# Patient Record
Sex: Male | Born: 1965 | Race: Black or African American | Hispanic: No | Marital: Married | Smoking: Never smoker
Health system: Southern US, Community
[De-identification: ages and names within clinical notes are randomized; demographics above are authoritative.]

## PROBLEM LIST (undated history)

## (undated) DIAGNOSIS — B9689 Other specified bacterial agents as the cause of diseases classified elsewhere: Secondary | ICD-10-CM

## (undated) DIAGNOSIS — T7840XA Allergy, unspecified, initial encounter: Secondary | ICD-10-CM

## (undated) DIAGNOSIS — R509 Fever, unspecified: Secondary | ICD-10-CM

## (undated) DIAGNOSIS — E119 Type 2 diabetes mellitus without complications: Secondary | ICD-10-CM

## (undated) DIAGNOSIS — I639 Cerebral infarction, unspecified: Secondary | ICD-10-CM

## (undated) DIAGNOSIS — A498 Other bacterial infections of unspecified site: Secondary | ICD-10-CM

## (undated) DIAGNOSIS — I619 Nontraumatic intracerebral hemorrhage, unspecified: Secondary | ICD-10-CM

## (undated) DIAGNOSIS — I5189 Other ill-defined heart diseases: Secondary | ICD-10-CM

## (undated) DIAGNOSIS — I10 Essential (primary) hypertension: Secondary | ICD-10-CM

## (undated) DIAGNOSIS — N183 Chronic kidney disease, stage 3 unspecified: Secondary | ICD-10-CM

## (undated) DIAGNOSIS — J9600 Acute respiratory failure, unspecified whether with hypoxia or hypercapnia: Secondary | ICD-10-CM

## (undated) DIAGNOSIS — I615 Nontraumatic intracerebral hemorrhage, intraventricular: Secondary | ICD-10-CM

## (undated) DIAGNOSIS — R066 Hiccough: Secondary | ICD-10-CM

## (undated) DIAGNOSIS — R7303 Prediabetes: Secondary | ICD-10-CM

## (undated) HISTORY — DX: Allergy, unspecified, initial encounter: T78.40XA

## (undated) HISTORY — DX: Prediabetes: R73.03

## (undated) HISTORY — PX: NO PAST SURGERIES: SHX2092

---

## 1998-11-02 ENCOUNTER — Emergency Department (HOSPITAL_COMMUNITY): Admission: EM | Admit: 1998-11-02 | Discharge: 1998-11-02 | Payer: Self-pay | Admitting: Emergency Medicine

## 2000-05-26 ENCOUNTER — Encounter: Payer: Self-pay | Admitting: Nephrology

## 2000-05-26 ENCOUNTER — Encounter: Admission: RE | Admit: 2000-05-26 | Discharge: 2000-05-26 | Payer: Self-pay | Admitting: Nephrology

## 2015-04-19 ENCOUNTER — Emergency Department (HOSPITAL_COMMUNITY): Payer: BLUE CROSS/BLUE SHIELD

## 2015-04-19 ENCOUNTER — Encounter (HOSPITAL_COMMUNITY): Payer: Self-pay | Admitting: Physical Medicine and Rehabilitation

## 2015-04-19 ENCOUNTER — Inpatient Hospital Stay (HOSPITAL_COMMUNITY)
Admission: EM | Admit: 2015-04-19 | Discharge: 2015-04-23 | DRG: 305 | Disposition: A | Discharge status: Home / Self Care | Payer: BLUE CROSS/BLUE SHIELD | Attending: Internal Medicine | Admitting: Internal Medicine

## 2015-04-19 DIAGNOSIS — R9431 Abnormal electrocardiogram [ECG] [EKG]: Secondary | ICD-10-CM | POA: Diagnosis not present

## 2015-04-19 DIAGNOSIS — I10 Essential (primary) hypertension: Secondary | ICD-10-CM | POA: Diagnosis not present

## 2015-04-19 DIAGNOSIS — N189 Chronic kidney disease, unspecified: Secondary | ICD-10-CM | POA: Diagnosis present

## 2015-04-19 DIAGNOSIS — R718 Other abnormality of red blood cells: Secondary | ICD-10-CM | POA: Diagnosis present

## 2015-04-19 DIAGNOSIS — E876 Hypokalemia: Secondary | ICD-10-CM | POA: Diagnosis present

## 2015-04-19 DIAGNOSIS — R0609 Other forms of dyspnea: Secondary | ICD-10-CM | POA: Diagnosis present

## 2015-04-19 DIAGNOSIS — N179 Acute kidney failure, unspecified: Secondary | ICD-10-CM | POA: Diagnosis not present

## 2015-04-19 DIAGNOSIS — Z9114 Patient's other noncompliance with medication regimen: Secondary | ICD-10-CM

## 2015-04-19 DIAGNOSIS — I253 Aneurysm of heart: Secondary | ICD-10-CM | POA: Diagnosis present

## 2015-04-19 DIAGNOSIS — I160 Hypertensive urgency: Principal | ICD-10-CM | POA: Diagnosis present

## 2015-04-19 DIAGNOSIS — I1310 Hypertensive heart and chronic kidney disease without heart failure, with stage 1 through stage 4 chronic kidney disease, or unspecified chronic kidney disease: Secondary | ICD-10-CM | POA: Diagnosis present

## 2015-04-19 DIAGNOSIS — Y92230 Patient room in hospital as the place of occurrence of the external cause: Secondary | ICD-10-CM | POA: Diagnosis not present

## 2015-04-19 DIAGNOSIS — T502X5A Adverse effect of carbonic-anhydrase inhibitors, benzothiadiazides and other diuretics, initial encounter: Secondary | ICD-10-CM | POA: Diagnosis not present

## 2015-04-19 DIAGNOSIS — R0602 Shortness of breath: Secondary | ICD-10-CM

## 2015-04-19 DIAGNOSIS — Z823 Family history of stroke: Secondary | ICD-10-CM

## 2015-04-19 HISTORY — DX: Essential (primary) hypertension: I10

## 2015-04-19 LAB — IRON AND TIBC
IRON: 55 ug/dL (ref 45–182)
SATURATION RATIOS: 17 % — AB (ref 17.9–39.5)
TIBC: 329 ug/dL (ref 250–450)
UIBC: 274 ug/dL

## 2015-04-19 LAB — CBC WITH DIFFERENTIAL/PLATELET
Basophils Absolute: 0 10*3/uL (ref 0.0–0.1)
Basophils Relative: 0 %
Eosinophils Absolute: 0 10*3/uL (ref 0.0–0.7)
Eosinophils Relative: 1 %
HCT: 43.5 % (ref 39.0–52.0)
Hemoglobin: 14 g/dL (ref 13.0–17.0)
Lymphocytes Relative: 26 %
Lymphs Abs: 1.8 10*3/uL (ref 0.7–4.0)
MCH: 24.4 pg — ABNORMAL LOW (ref 26.0–34.0)
MCHC: 32.2 g/dL (ref 30.0–36.0)
MCV: 75.9 fL — ABNORMAL LOW (ref 78.0–100.0)
Monocytes Absolute: 0.2 10*3/uL (ref 0.1–1.0)
Monocytes Relative: 4 %
Neutro Abs: 4.7 10*3/uL (ref 1.7–7.7)
Neutrophils Relative %: 69 %
Platelets: 199 10*3/uL (ref 150–400)
RBC: 5.73 MIL/uL (ref 4.22–5.81)
RDW: 13.6 % (ref 11.5–15.5)
WBC: 6.8 10*3/uL (ref 4.0–10.5)

## 2015-04-19 LAB — COMPREHENSIVE METABOLIC PANEL
ALT: 44 U/L (ref 17–63)
AST: 30 U/L (ref 15–41)
Albumin: 3.8 g/dL (ref 3.5–5.0)
Alkaline Phosphatase: 56 U/L (ref 38–126)
Anion gap: 10 (ref 5–15)
BUN: 13 mg/dL (ref 6–20)
CO2: 28 mmol/L (ref 22–32)
Calcium: 9.1 mg/dL (ref 8.9–10.3)
Chloride: 102 mmol/L (ref 101–111)
Creatinine, Ser: 1.36 mg/dL — ABNORMAL HIGH (ref 0.61–1.24)
GFR calc Af Amer: >60 mL/min (ref >60)
GFR calc non Af Amer: 59 mL/min — ABNORMAL LOW (ref >60)
Glucose, Bld: 121 mg/dL — ABNORMAL HIGH (ref 65–99)
Potassium: 3.4 mmol/L — ABNORMAL LOW (ref 3.5–5.1)
Sodium: 140 mmol/L (ref 135–145)
Total Bilirubin: 0.5 mg/dL (ref 0.3–1.2)
Total Protein: 7.4 g/dL (ref 6.5–8.1)

## 2015-04-19 LAB — URINALYSIS, ROUTINE W REFLEX MICROSCOPIC
Bilirubin Urine: NEGATIVE
Glucose, UA: NEGATIVE mg/dL
Hgb urine dipstick: NEGATIVE
Ketones, ur: NEGATIVE mg/dL
LEUKOCYTES UA: NEGATIVE
NITRITE: NEGATIVE
PH: 6.5 (ref 5.0–8.0)
Protein, ur: NEGATIVE mg/dL
Specific Gravity, Urine: 1.016 (ref 1.005–1.030)

## 2015-04-19 LAB — BRAIN NATRIURETIC PEPTIDE: B Natriuretic Peptide: 61.3 pg/mL (ref 0.0–100.0)

## 2015-04-19 LAB — TSH: TSH: 1.673 u[IU]/mL (ref 0.350–4.500)

## 2015-04-19 LAB — VITAMIN B12: Vitamin B-12: 1134 pg/mL — ABNORMAL HIGH (ref 180–914)

## 2015-04-19 LAB — FERRITIN: Ferritin: 96 ng/mL (ref 24–336)

## 2015-04-19 LAB — I-STAT TROPONIN, ED: Troponin i, poc: 0.03 ng/mL (ref 0.00–0.08)

## 2015-04-19 LAB — FOLATE: FOLATE: 10.6 ng/mL (ref >5.9)

## 2015-04-19 LAB — RETICULOCYTES
RBC.: 5.28 MIL/uL (ref 4.22–5.81)
Retic Count, Absolute: 95 K/uL (ref 19.0–186.0)
Retic Ct Pct: 1.8 % (ref 0.4–3.1)

## 2015-04-19 LAB — MAGNESIUM: Magnesium: 2.2 mg/dL (ref 1.7–2.4)

## 2015-04-19 MED ORDER — ALUM & MAG HYDROXIDE-SIMETH 200-200-20 MG/5ML PO SUSP
30.0000 mL | Freq: Four times a day (QID) | ORAL | Status: DC | PRN
  Administered 2015-04-19 – 2015-04-22 (×2): 30 mL via ORAL
  Filled 2015-04-19 (×3): qty 30

## 2015-04-19 MED ORDER — SODIUM CHLORIDE 0.9% FLUSH
3.0000 mL | Freq: Two times a day (BID) | INTRAVENOUS | Status: DC
  Administered 2015-04-19 – 2015-04-22 (×5): 3 mL via INTRAVENOUS

## 2015-04-19 MED ORDER — ACETAMINOPHEN 325 MG PO TABS
650.0000 mg | ORAL_TABLET | Freq: Four times a day (QID) | ORAL | Status: DC | PRN
  Administered 2015-04-20 (×2): 650 mg via ORAL
  Filled 2015-04-19 (×2): qty 2

## 2015-04-19 MED ORDER — ENOXAPARIN SODIUM 40 MG/0.4ML ~~LOC~~ SOLN
40.0000 mg | SUBCUTANEOUS | Status: DC
  Filled 2015-04-19 (×3): qty 0.4

## 2015-04-19 MED ORDER — ACETAMINOPHEN 650 MG RE SUPP: 650.0000 mg | Freq: Four times a day (QID) | RECTAL | Status: DC | PRN

## 2015-04-19 MED ORDER — CLONIDINE HCL 0.2 MG PO TABS
0.2000 mg | ORAL_TABLET | Freq: Once | ORAL | Status: AC
  Administered 2015-04-19: 0.2 mg via ORAL
  Filled 2015-04-19: qty 1

## 2015-04-19 MED ORDER — ASPIRIN EC 81 MG PO TBEC
81.0000 mg | DELAYED_RELEASE_TABLET | Freq: Every day | ORAL | Status: DC
  Administered 2015-04-19 – 2015-04-23 (×5): 81 mg via ORAL
  Filled 2015-04-19 (×5): qty 1

## 2015-04-19 MED ORDER — AMLODIPINE BESYLATE 5 MG PO TABS
10.0000 mg | ORAL_TABLET | Freq: Every day | ORAL | Status: DC
  Administered 2015-04-19: 10 mg via ORAL
  Filled 2015-04-19: qty 2

## 2015-04-19 MED ORDER — LISINOPRIL 20 MG PO TABS
20.0000 mg | ORAL_TABLET | Freq: Every day | ORAL | Status: DC
  Administered 2015-04-19 – 2015-04-20 (×2): 20 mg via ORAL
  Filled 2015-04-19 (×2): qty 1

## 2015-04-19 MED ORDER — POTASSIUM CHLORIDE CRYS ER 20 MEQ PO TBCR
40.0000 meq | EXTENDED_RELEASE_TABLET | Freq: Once | ORAL | Status: AC
  Administered 2015-04-19: 40 meq via ORAL
  Filled 2015-04-19: qty 2

## 2015-04-19 NOTE — ED Notes (Signed)
Admitting at bedside 

## 2015-04-19 NOTE — H&P (Signed)
[facility] History and Physical                                                                                    Stephen Delgado, is a 50 y.o. male  MRN: [id number]   DOB - 1965-02-28  Admit Date - 04/19/2015  Outpatient Primary MD UNASSIGNED (previously saw Dr. Anson Fret)  Referring MD: Lacinda Axon / ER  Consulting M.D: Silverio Decamp / Neurology  PMH: Past Medical History  Diagnosis Date  . Hypertension       PSH: History reviewed. No pertinent past surgical history.   CC:  Chief Complaint  Patient presents with  . Hypertension     HPI: 50 year old [place] male with long-standing history of hypertension. His primary care physician recently retired and he is in the process of establishing with a new physician who he describes as "an [place] doctor". Patient reports that he typically forgets to take his blood pressure pills. He is able to afford his medication (Lisinopril) which is available at [facility] for $4. He reports for the past 4 days he's been experiencing dyspnea on exertion, increased belching symptoms as well as increased flatus. He has not had any abdominal pain or no GI symptoms, no postprandial symptoms, he denies a family history of significant medical disease including hypertension, diabetes or heart attacks/heart problems. He has not had any lower extremity edema. He's not had any orthopnea. He has not had any diaphoresis or chest pain with activity.  ER Evaluation and treatment: Afebrile-initial blood pressure 227/128-pulse 101-respirations 20; repeat blood pressure 167/109; room air saturations 100% EKG: Sinus rhythm with ventricular rate 90 bpm, QTC 447 seconds, voltage criteria met for LVH, also evidence of LV strain with downsloping ST segment with T-wave inversion in leads 1-3 V5 and V6 but no obvious ischemic changes and no conduction delays 2 view CXR: No edema or consolidation and no radiographic evidence of cardiomegaly Lab data: K 3.4, Cr 1.36, BNP 61.3,  troponin 0.03, MCV 75.9 with hemoglobin 14 Catapres 0.2 mg tablet 1 dose Norvasc 10 mg by mouth 1 dose Aspirin 81 mg 1 dose  Review of Systems   In addition to the HPI above,  No Fever-chills, myalgias or other constitutional symptoms No Headache, changes with Vision or hearing, new weakness, tingling, numbness in any extremity, No problems swallowing food or Liquids, indigestion/reflux No Chest pain, Cough, palpitations, orthopnea  No Abdominal pain, N/V; no melena or hematochezia, no dark tarry stools No dysuria, hematuria or flank pain No new skin rashes, lesions, masses or bruises, No new joints pains-aches No recent weight gain or loss No polyuria, polydypsia or polyphagia,  *A full 10 point Review of Systems was done, except as stated above, all other Review of Systems were negative.  Social History Social History  Substance Use Topics  . Smoking status: Never Smoker   . Smokeless tobacco: Not on file  . 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 Patient denies significant family history of  cardiovascular disease, need for dialysis, diabetes, hypertension   Prior to Admission medications   Medication Sig Start Date End Date Taking? Authorizing Provider  aspirin EC 81 MG tablet Take 81 mg by mouth daily.   Yes Historical Provider, MD  lisinopril (PRINIVIL,ZESTRIL) 20 MG tablet Take 20 mg by mouth daily.   Yes Historical Provider, MD    No Known Allergies  Physical Exam  Vitals  Blood pressure 167/109, pulse 84, temperature 98 F (36.7 C), temperature source Oral, resp. rate 17, SpO2 100 %.   General:  In no acute distress, appears healthy and well nourished  Psych:  Normal affect, Denies Suicidal or Homicidal ideations, Awake Alert, Oriented X 3. Speech and  thought patterns are clear and appropriate, no apparent short term memory deficits  Neuro:   No focal neurological deficits, CN II through XII intact, Strength 5/5 all 4 extremities, Sensation intact all 4 extremities.  ENT:  Ears and Eyes appear Normal, Conjunctivae clear, PER. Moist oral mucosa without erythema or exudates.  Neck:  Supple, No lymphadenopathy appreciated  Respiratory:  Symmetrical chest wall movement, Good air movement bilaterally, CTAB. Room Air  Cardiac:  RRR, No Murmurs, no LE edema noted, no JVD, No carotid bruits, peripheral pulses palpable at 2+  Abdomen:  Positive bowel sounds, Soft, Non tender, Non distended,  No masses appreciated, no obvious hepatosplenomegaly  Skin:  No Cyanosis, Normal Skin Turgor, No Skin Rash or Bruise.  Extremities: Symmetrical without obvious trauma or injury,  no effusions.  Data Review  CBC  Recent Labs Lab 04/19/15 0933  WBC 6.8  HGB 14.0  HCT 43.5  PLT 199  MCV 75.9*  MCH 24.4*  MCHC 32.2  RDW 13.6  LYMPHSABS 1.8  MONOABS 0.2  EOSABS 0.0  BASOSABS 0.0    Chemistries   Recent Labs Lab 04/19/15 0933  NA 140  K 3.4*  CL 102  CO2 28  GLUCOSE 121*  BUN 13  CREATININE 1.36*  CALCIUM 9.1  AST 30  ALT 44  ALKPHOS 56  BILITOT 0.5    CrCl cannot be calculated (Unknown ideal weight.).  No results for input(s): TSH, T4TOTAL, T3FREE, THYROIDAB in the last 72 hours.  Invalid input(s): FREET3  Coagulation profile No results for input(s): INR, PROTIME in the last 168 hours.  No results for input(s): DDIMER in the last 72 hours.  Cardiac Enzymes No results for input(s): CKMB, TROPONINI, MYOGLOBIN in the last 168 hours.  Invalid input(s): CK  Invalid input(s): POCBNP  Urinalysis No results found for: COLORURINE, APPEARANCEUR, LABSPEC, PHURINE, GLUCOSEU, HGBUR, BILIRUBINUR, KETONESUR, PROTEINUR, UROBILINOGEN, NITRITE, LEUKOCYTESUR  Imaging results:   Dg Chest 2 View  04/19/2015  CLINICAL DATA:   Shortness of breath with exertion.  Hypertension. EXAM: CHEST  2 VIEW COMPARISON:  None. FINDINGS: There is no edema or consolidation. The heart size and pulmonary vascularity are normal. No adenopathy. There is thoracolumbar dextroscoliosis with upper lumbar levoscoliosis. IMPRESSION: No edema or consolidation. Electronically Signed   By: Lowella Grip III M.D.   On: 04/19/2015 11:35     EKG: (Independently reviewed)  Sinus rhythm with ventricular rate 90 bpm, QTC 447 seconds, voltage criteria met for LVH, also evidence of LV strain with downsloping ST segment with T-wave inversion in leads 1-3 V5 and V6 but no obvious ischemic changes and no conduction delays   Assessment & Plan  Principal Problem:   Accelerated hypertension -Related to noncompliance with medical regimen -Admit to telemetry/Obs -Patient's systolic blood pressure has decreased somewhat  after being given alpha-blocker in ER but given history noncompliance would not continue this medication -I gave a one-time dose of Norvasc 10 mg in setting of acute kidney injury from poor perfusion in setting of persistent hypertension but plan on resuming patient's preadmission lisinopril and a.m. -Patient reports is set to establish with "an [place] physician" in town but would like case management to confirm prior to discharge -Fasting lipid panel  Active Problems:   Abnormal EKG -Voltage criteria consistent with LVH likely related to inconsistently treated hypertension -Echocardiogram    AKI  -Suspect related to poor perfusion and uncontrolled hypertension -Repeat electrolytes in a.m. -Plan to resume ACE inhibitor in a.m.    Acute hypokalemia -Oral replete -Follow labs    DOE  -Suspect related to significantly elevated high blood pressure -Chest x-ray and BNP unremarkable -Follow up on echo and if any regional wall motion abnormalities with then pursue ischemic evaluation    Microcytosis -Not associated with  anemia -Check anemia panel, TSH and since has not seen physician in a while will also check hemoglobin A1c for screening purposes    DVT Prophylaxis: Lovenox  Family Communication:   Family member at bedside with patient's permission  Code Status:  Full code  Condition:  Stable  Discharge disposition: Anticipate discharge in a.m. to previous admission environment  Time spent in minutes : 60      ELLIS,ALLISON L. ANP on 04/19/2015 at 2:33 PM  You may contact me by going to [email or web address] - password [email or web address]  I am available from 7a-7p but please confirm I am on the schedule by going to Amion as above.   After 7p please contact night coverage person covering me after hours  [facility]

## 2015-04-19 NOTE — Progress Notes (Signed)
CLARIFICATION: Please disregard Nephrology consultation on H&P -this was documented in ERROR  Junious SilkAllison Leyna Vanderkolk, ANP

## 2015-04-19 NOTE — ED Provider Notes (Signed)
CSN: [id number]     Arrival date & time 04/19/15  3536 History   First MD Initiated Contact with Patient 04/19/15 1003     Chief Complaint  Patient presents with  . Hypertension   HPI  Stephen Delgado is a 50 year old male with PMHx of HTN presenting with fatigue, shortness of breath and indigestion. He is found to be hypertensive to 227/128 in triage. He acknowledges that he has high blood pressure and also admits to noncompliance with his medication. He states "sometimes I just forgets to take it so I guess it's not a regular thing for me". He has a bottle of lisinopril with him that was last filled in December 2016. He states he has not had it filled since. He does not check his blood pressure at home. He complains of generalized fatigue and restlessness over the past 3 days. He states it is a sensation that he cannot sleep and must always be up and walking around. He endorses dyspnea on exertion. He states that he can't get an easy breath when he is walking. He also notes mild shortness of breath while laying flat in bed. He states this may be the cause of his nighttime restlessness. He is also complaining of indigestion which includes frequent burping. He states his abdomen feels slightly bloated. He reports a history of acid reflux but does not take daily medications. He denies all pain complaints. He denies chest pain or abdominal pain. He does not have a current primary care provider. Denies fevers, chills, headache, dizziness, syncope, vision changes, URI symptoms, cough, nausea, vomiting or diarrhea. Denies family history of cardiac disease. He has never been evaluated by a cardiologist. He does not smoke cigarettes.  Past Medical History  Diagnosis Date  . Hypertension    History reviewed. No pertinent past surgical history. No family history on file. Social History  Substance Use Topics  . Smoking status: Never Smoker   . Smokeless tobacco: None  . Alcohol Use: No    Review of Systems   Constitutional: Positive for fatigue.  Respiratory: Positive for shortness of breath.   Gastrointestinal: Positive for abdominal distention.  All other systems reviewed and are negative.     Allergies  Review of patient's allergies indicates no known allergies.  Home Medications   Prior to Admission medications   Medication Sig Start Date End Date Taking? Authorizing Provider  aspirin EC 81 MG tablet Take 81 mg by mouth daily.   Yes Historical Provider, MD  lisinopril (PRINIVIL,ZESTRIL) 20 MG tablet Take 20 mg by mouth daily.   Yes Historical Provider, MD   BP 178/106 mmHg  Pulse 87  Temp(Src) 98 F (36.7 C) (Oral)  Resp 12  SpO2 96% Physical Exam  Constitutional: He appears well-developed and well-nourished. No distress.  Nontoxic-appearing  HENT:  Head: Normocephalic and atraumatic.  Mouth/Throat: Oropharynx is clear and moist.  Eyes: Conjunctivae and EOM are normal. Pupils are equal, round, and reactive to light. Right eye exhibits no discharge. Left eye exhibits no discharge. No scleral icterus.  Neck: Normal range of motion. Neck supple.  Cardiovascular: Normal rate, regular rhythm and normal heart sounds.   No murmur heard. Pulmonary/Chest: Effort normal and breath sounds normal. No respiratory distress. He has no wheezes. He has no rales.  Abdominal: Soft. He exhibits no distension. There is no tenderness. There is no rebound.  Musculoskeletal: Normal range of motion.  Neurological: He is alert. Coordination normal.  Skin: Skin is warm and dry.  Psychiatric: He has a normal mood and affect. His behavior is normal.  Nursing note and vitals reviewed.   ED Course  Procedures (including critical care time) Labs Review Labs Reviewed  CBC WITH DIFFERENTIAL/PLATELET - Abnormal; Notable for the following:    MCV 75.9 (*)    MCH 24.4 (*)    All other components within normal limits  COMPREHENSIVE METABOLIC PANEL - Abnormal; Notable for the following:    Potassium  3.4 (*)    Glucose, Bld 121 (*)    Creatinine, Ser 1.36 (*)    GFR calc non Af Amer 59 (*)    All other components within normal limits  BRAIN NATRIURETIC PEPTIDE  URINALYSIS, ROUTINE W REFLEX MICROSCOPIC (NOT AT [facility])  Rosezena SensorI-STAT TROPOININ, ED    Imaging Review Dg Chest 2 View  04/19/2015  CLINICAL DATA:  Shortness of breath with exertion.  Hypertension. EXAM: CHEST  2 VIEW COMPARISON:  None. FINDINGS: There is no edema or consolidation. The heart size and pulmonary vascularity are normal. No adenopathy. There is thoracolumbar dextroscoliosis with upper lumbar levoscoliosis. IMPRESSION: No edema or consolidation. Electronically Signed   By: Bretta BangWilliam  Woodruff III M.D.   On: 04/19/2015 11:35   I have personally reviewed and evaluated these images and lab results as part of my medical decision-making.   EKG Interpretation   Date/Time:  Saturday April 19 2015 10:54:04 EDT Ventricular Rate:  90 PR Interval:  169 QRS Duration: 90 QT Interval:  365 QTC Calculation: 447 R Axis:   34 Text Interpretation:  Sinus rhythm RSR' in V1 or V2, right VCD or RVH  Probable LVH with secondary repol abnrm Anterior ST elevation, probably  due to LVH Confirmed by COOK  MD, BRIAN ([id number]) on 04/19/2015 10:56:50 AM      MDM   Final diagnoses:  Essential hypertension  SOB (shortness of breath) on exertion   50 year old male presenting with 3 days of shortness of breath on exertion, soft and, indigestion and "restlessness ". History of hypertension and medication noncompliance. Hypertensive to 227/128 in triage. Received 0.2 clonidine and blood pressure reduced to 178/106. Nontoxic-appearing. Benign physical exam. Heart regular rate and rhythm without murmur. Lungs clear to auscultation bilaterally. CBC unremarkable. Potassium 3.4 which was repleted in the emergency department. Creatinine elevated to 1.36. With no past blood work to compare, unsure if this is a care time versus chronic kidney disease. Given  uncontrolled hypertension, likely to be a chronic issue. BNP 61. Troponin 0.03 with a nonischemic EKG. Chest x-ray with no edema. The EKG is nonischemic, there are T waves and ST abnormalities. No history of EKGs to compare. Given patient's uncontrolled blood pressure and nonspecific complaints, I feel that admission to observation and cardiology to follow is appropriate. Discussed case with attending Dr. Adriana Simasook who agrees with my plan. Consulted [facility] who will admit for further evaluation.    Alveta HeimlichStevi Karee Forge, PA-C 04/19/15 1338  Donnetta HutchingBrian Cook, MD 04/20/15 1007

## 2015-04-19 NOTE — ED Notes (Signed)
Pt reports feeling weak and tired. History of HTN, non compliant with medication. Also reports frequent burping and belching, issues with heartburn

## 2015-04-19 NOTE — ED Notes (Signed)
Patient stated "yes, I take medicine for my pressure but sometimes I skip doses here and there".

## 2015-04-20 ENCOUNTER — Observation Stay (HOSPITAL_BASED_OUTPATIENT_CLINIC_OR_DEPARTMENT_OTHER): Payer: BLUE CROSS/BLUE SHIELD

## 2015-04-20 DIAGNOSIS — R079 Chest pain, unspecified: Secondary | ICD-10-CM

## 2015-04-20 DIAGNOSIS — R0609 Other forms of dyspnea: Secondary | ICD-10-CM

## 2015-04-20 DIAGNOSIS — N179 Acute kidney failure, unspecified: Secondary | ICD-10-CM | POA: Diagnosis not present

## 2015-04-20 DIAGNOSIS — E876 Hypokalemia: Secondary | ICD-10-CM | POA: Diagnosis not present

## 2015-04-20 DIAGNOSIS — R9431 Abnormal electrocardiogram [ECG] [EKG]: Secondary | ICD-10-CM | POA: Diagnosis not present

## 2015-04-20 DIAGNOSIS — I10 Essential (primary) hypertension: Secondary | ICD-10-CM | POA: Diagnosis not present

## 2015-04-20 LAB — BASIC METABOLIC PANEL
Anion gap: 10 (ref 5–15)
BUN: 16 mg/dL (ref 6–20)
CHLORIDE: 105 mmol/L (ref 101–111)
CO2: 29 mmol/L (ref 22–32)
CREATININE: 1.33 mg/dL — AB (ref 0.61–1.24)
Calcium: 9.4 mg/dL (ref 8.9–10.3)
Glucose, Bld: 109 mg/dL — ABNORMAL HIGH (ref 65–99)
Potassium: 3.8 mmol/L (ref 3.5–5.1)
Sodium: 144 mmol/L (ref 135–145)

## 2015-04-20 LAB — LIPID PANEL
CHOLESTEROL: 142 mg/dL (ref 0–200)
HDL: 40 mg/dL — ABNORMAL LOW (ref >40)
LDL Cholesterol: 88 mg/dL (ref 0–99)
TRIGLYCERIDES: 70 mg/dL (ref <150)
Total CHOL/HDL Ratio: 3.6 RATIO
VLDL: 14 mg/dL (ref 0–40)

## 2015-04-20 MED ORDER — HYDROCHLOROTHIAZIDE 12.5 MG PO CAPS
12.5000 mg | ORAL_CAPSULE | Freq: Every day | ORAL | Status: DC
  Administered 2015-04-20 – 2015-04-21 (×2): 12.5 mg via ORAL
  Filled 2015-04-20 (×2): qty 1

## 2015-04-20 MED ORDER — LABETALOL HCL 5 MG/ML IV SOLN
20.0000 mg | Freq: Once | INTRAVENOUS | Status: AC
Start: 2015-04-20 — End: 2015-04-20
  Administered 2015-04-20: 20 mg via INTRAVENOUS
  Filled 2015-04-20: qty 4

## 2015-04-20 MED ORDER — LORATADINE 10 MG PO TABS
10.0000 mg | ORAL_TABLET | Freq: Every day | ORAL | Status: DC
  Administered 2015-04-20 – 2015-04-23 (×4): 10 mg via ORAL
  Filled 2015-04-20 (×4): qty 1

## 2015-04-20 MED ORDER — HYDRALAZINE HCL 20 MG/ML IJ SOLN
10.0000 mg | Freq: Four times a day (QID) | INTRAMUSCULAR | Status: DC | PRN
  Administered 2015-04-20 (×2): 10 mg via INTRAVENOUS
  Filled 2015-04-20 (×2): qty 1

## 2015-04-20 MED ORDER — AMLODIPINE BESYLATE 5 MG PO TABS
5.0000 mg | ORAL_TABLET | Freq: Once | ORAL | Status: AC
  Administered 2015-04-20: 5 mg via ORAL
  Filled 2015-04-20: qty 1

## 2015-04-20 MED ORDER — AMLODIPINE BESYLATE 10 MG PO TABS
10.0000 mg | ORAL_TABLET | Freq: Every day | ORAL | Status: DC
  Administered 2015-04-21 – 2015-04-23 (×3): 10 mg via ORAL
  Filled 2015-04-20 (×3): qty 1

## 2015-04-20 MED ORDER — LISINOPRIL 40 MG PO TABS
40.0000 mg | ORAL_TABLET | Freq: Every day | ORAL | Status: DC
Start: 2015-04-21 — End: 2015-04-21
  Administered 2015-04-21: 40 mg via ORAL
  Filled 2015-04-20: qty 1

## 2015-04-20 MED ORDER — HYDRALAZINE HCL 20 MG/ML IJ SOLN
10.0000 mg | Freq: Once | INTRAMUSCULAR | Status: AC
  Administered 2015-04-20: 10 mg via INTRAVENOUS
  Filled 2015-04-20: qty 1

## 2015-04-20 MED ORDER — AMLODIPINE BESYLATE 5 MG PO TABS
5.0000 mg | ORAL_TABLET | Freq: Every day | ORAL | Status: DC
  Filled 2015-04-20: qty 1

## 2015-04-20 MED ORDER — AMLODIPINE BESYLATE 5 MG PO TABS
5.0000 mg | ORAL_TABLET | Freq: Every day | ORAL | Status: DC
  Administered 2015-04-20: 5 mg via ORAL

## 2015-04-20 MED ORDER — LABETALOL HCL 5 MG/ML IV SOLN
20.0000 mg | INTRAVENOUS | Status: DC | PRN
  Administered 2015-04-20: 20 mg via INTRAVENOUS
  Filled 2015-04-20: qty 4

## 2015-04-20 NOTE — Progress Notes (Signed)
Pt BP 185/103 this am.  Lenny Pastelom Callahan, NP text paged to notify.  Awaiting return call.

## 2015-04-20 NOTE — Progress Notes (Signed)
  Echocardiogram 2D Echocardiogram has been performed.  Arvil ChacoFoster, Tasneem Cormier 04/20/2015, 5:24 PM

## 2015-04-20 NOTE — Progress Notes (Signed)
Pt BP 198/11 HR 91, pt c/o HA 5/10. [place], NP notified and awaiting orders.  Will continue to monitor closely.

## 2015-04-20 NOTE — Progress Notes (Signed)
[facility]                                                                              Patient Demographics  Abb Gobert, is a 50 y.o. male, DOB - Oct 25, 1965, ZOX:[id number]  Admit date - 04/19/2015   Admitting Physician Clydia Llano, MD  Outpatient Primary MD for the patient is No primary care provider on file.  LOS -   days    Chief Complaint  Patient presents with  . Hypertension       Brief HPI   50 year old [place] male with long-standing history of hypertension. His primary care physician recently retired and he is in the process of establishing with a new physician who he describes as "an [place] doctor". Patient reported that he typically forgets to take his blood pressure pills. He is able to afford his medication (Lisinopril) which is available at [facility] for $4. He reported for the past 4 days, he noticed dyspnea on exertion, increased belching symptoms as well as increased flatus. He has not had any abdominal pain or no GI symptoms, no postprandial symptoms. He denied any peripheral edema or orthopnea, chest pain or any diaphoresis.    Assessment & Plan    Principal Problem:   Accelerated hypertension: Due to noncompliance, has a long-standing history, then lost insurance, PCP retired. Patient was taking lisinopril ($4 on [facility] list ) sporadically. Patient now reports that he has insurance. - Continue lisinopril, patient will benefit from HCTZ (doal therapy better than monotherapy), monitor creatinine closely - Given significantly elevated BP, I also placed him on Norvasc 5 mg daily , titrate up as needed - For now continue hydralazine as needed with parameters - TSH 1.6 - If no significant improvement in BP, will do further workup for secondary hypertension   Active Problems:   Abnormal EKG - EKG reviewed with Dr. Jens Som this morning, shows LVH but no ST elevations . Repeat EKG showed T-wave inversions in the lateral leads and inferior  leads. - 2-D echo pending    AKI (acute kidney injury) (HCC): May have a component of chronic kidney disease due to hypertensive nephropathy - Creatinine 1.36 at the time of admission, monitor closely as HCTZ added today    Acute hypokalemia-replaced    DOE (dyspnea on exertion) -Follow 2-D echo     Microcytosis - Outpatient workup, iron saturation ratio 17, ferritin 96, TIBC U IBC within normal range   Code Status: Full code   Family Communication: Discussed in detail with the patient, all imaging results, lab results explained to the patient   Disposition Plan: Likely DC in am   Time Spent in minutes  25 minutes  Procedures  None   Consults   None   DVT Prophylaxis  Lovenox  Medications  Scheduled Meds: . amLODipine  5 mg Oral Daily  . aspirin EC  81 mg Oral Daily  . enoxaparin (LOVENOX) injection  40 mg Subcutaneous Q24H  . hydrochlorothiazide  12.5 mg Oral Daily  . lisinopril  20 mg Oral Daily  . loratadine  10 mg Oral Daily  . sodium chloride flush  3 mL Intravenous Q12H  Continuous Infusions:  PRN Meds:.acetaminophen **OR** acetaminophen, alum & mag hydroxide-simeth, hydrALAZINE   Antibiotics   Anti-infectives    None        Subjective:   Aksel Bencomo was seen and examined today. No headache or blurred vision.  Patient denies dizziness, chest pain, shortness of breath, abdominal pain, N/V/D/C, new weakness, numbess, tingling. No acute events overnight.    Objective:   Filed Vitals:   04/19/15 1945 04/20/15 0523 04/20/15 0640 04/20/15 0719  BP: 165/94 185/103 190/100 177/104  Pulse: 87 81    Temp: 98.9 F (37.2 C) 98.2 F (36.8 C)    TempSrc: Oral     Resp: 18 17    Height:      Weight:  84.006 kg (185 lb 3.2 oz)    SpO2: 100% 100%      Intake/Output Summary (Last 24 hours) at 04/20/15 1113 Last data filed at 04/20/15 0524  Gross per 24 hour  Intake    603 ml  Output    300 ml  Net    303 ml     Wt Readings from Last 3  Encounters:  04/20/15 84.006 kg (185 lb 3.2 oz)     Exam  General: Alert and oriented x 3, NAD  HEENT:  PERRLA, EOMI, Anicteric Sclera, mucous membranes moist.   Neck: Supple, no JVD, no masses  CVS: S1 S2 auscultated, no rubs, murmurs or gallops. Regular rate and rhythm.  Respiratory: Clear to auscultation bilaterally, no wheezing, rales or rhonchi  Abdomen: Soft, nontender, nondistended, + bowel sounds  Ext: no cyanosis clubbing or edema  Neuro: AAOx3, Cr N's II- XII. Strength 5/5 upper and lower extremities bilaterally  Skin: No rashes  Psych: Normal affect and demeanor, alert and oriented x3    Data Reviewed:  I have personally reviewed following labs and imaging studies  Micro Results No results found for this or any previous visit (from the past 240 hour(s)).  Radiology Reports Dg Chest 2 View  04/19/2015  CLINICAL DATA:  Shortness of breath with exertion.  Hypertension. EXAM: CHEST  2 VIEW COMPARISON:  None. FINDINGS: There is no edema or consolidation. The heart size and pulmonary vascularity are normal. No adenopathy. There is thoracolumbar dextroscoliosis with upper lumbar levoscoliosis. IMPRESSION: No edema or consolidation. Electronically Signed   By: Bretta Bang III M.D.   On: 04/19/2015 11:35    CBC  Recent Labs Lab 04/19/15 0933  WBC 6.8  HGB 14.0  HCT 43.5  PLT 199  MCV 75.9*  MCH 24.4*  MCHC 32.2  RDW 13.6  LYMPHSABS 1.8  MONOABS 0.2  EOSABS 0.0  BASOSABS 0.0    Chemistries   Recent Labs Lab 04/19/15 0933 04/19/15 1822 04/20/15 0504  NA 140  --  144  K 3.4*  --  3.8  CL 102  --  105  CO2 28  --  29  GLUCOSE 121*  --  109*  BUN 13  --  16  CREATININE 1.36*  --  1.33*  CALCIUM 9.1  --  9.4  MG  --  2.2  --   AST 30  --   --   ALT 44  --   --   ALKPHOS 56  --   --   BILITOT 0.5  --   --    ------------------------------------------------------------------------------------------------------------------ estimated  creatinine clearance is 72.1 mL/min (by C-G formula based on Cr of 1.33). ------------------------------------------------------------------------------------------------------------------ No results for input(s): HGBA1C in the last 72 hours. ------------------------------------------------------------------------------------------------------------------  Recent Labs  04/20/15 0504  CHOL 142  HDL 40*  LDLCALC 88  TRIG 70  CHOLHDL 3.6   ------------------------------------------------------------------------------------------------------------------  Recent Labs  04/19/15 1822  TSH 1.673   ------------------------------------------------------------------------------------------------------------------  Recent Labs  04/19/15 1527  VITAMINB12 1134*  FOLATE 10.6  FERRITIN 96  TIBC 329  IRON 55  RETICCTPCT 1.8    Coagulation profile No results for input(s): INR, PROTIME in the last 168 hours.  No results for input(s): DDIMER in the last 72 hours.  Cardiac Enzymes No results for input(s): CKMB, TROPONINI, MYOGLOBIN in the last 168 hours.  Invalid input(s): CK ------------------------------------------------------------------------------------------------------------------ Invalid input(s): POCBNP  No results for input(s): GLUCAP in the last 72 hours.   Kerin Kren M.D. [facility] 04/20/2015, 11:13 AM  Pager: [phone] Between 7am to 7pm - call Pager - [phone]  After 7pm go to [email or web address] - password [email or web address]  Call night coverage person covering after 7pm

## 2015-04-21 ENCOUNTER — Observation Stay (HOSPITAL_BASED_OUTPATIENT_CLINIC_OR_DEPARTMENT_OTHER): Payer: BLUE CROSS/BLUE SHIELD

## 2015-04-21 ENCOUNTER — Encounter (HOSPITAL_COMMUNITY): Payer: Self-pay | Admitting: Physician Assistant

## 2015-04-21 DIAGNOSIS — E876 Hypokalemia: Secondary | ICD-10-CM | POA: Diagnosis not present

## 2015-04-21 DIAGNOSIS — N179 Acute kidney failure, unspecified: Secondary | ICD-10-CM

## 2015-04-21 DIAGNOSIS — I10 Essential (primary) hypertension: Secondary | ICD-10-CM | POA: Diagnosis not present

## 2015-04-21 DIAGNOSIS — R9431 Abnormal electrocardiogram [ECG] [EKG]: Secondary | ICD-10-CM | POA: Diagnosis not present

## 2015-04-21 LAB — BASIC METABOLIC PANEL
Anion gap: 12 (ref 5–15)
BUN: 18 mg/dL (ref 6–20)
CO2: 27 mmol/L (ref 22–32)
Calcium: 9.3 mg/dL (ref 8.9–10.3)
Chloride: 104 mmol/L (ref 101–111)
Creatinine, Ser: 1.8 mg/dL — ABNORMAL HIGH (ref 0.61–1.24)
GFR calc Af Amer: 49 mL/min — ABNORMAL LOW (ref >60)
GFR calc non Af Amer: 42 mL/min — ABNORMAL LOW (ref >60)
GLUCOSE: 138 mg/dL — AB (ref 65–99)
POTASSIUM: 3.6 mmol/L (ref 3.5–5.1)
SODIUM: 143 mmol/L (ref 135–145)

## 2015-04-21 LAB — RAPID URINE DRUG SCREEN, HOSP PERFORMED
Amphetamines: NOT DETECTED
BENZODIAZEPINES: NOT DETECTED
Barbiturates: NOT DETECTED
Cocaine: NOT DETECTED
OPIATES: NOT DETECTED
Tetrahydrocannabinol: NOT DETECTED

## 2015-04-21 LAB — ECHOCARDIOGRAM COMPLETE
Height: 72 in
WEIGHTICAEL: 2963.2 [oz_av]

## 2015-04-21 LAB — CBC
HEMATOCRIT: 44.9 % (ref 39.0–52.0)
HEMOGLOBIN: 15.1 g/dL (ref 13.0–17.0)
MCH: 25.4 pg — AB (ref 26.0–34.0)
MCHC: 33.6 g/dL (ref 30.0–36.0)
MCV: 75.5 fL — AB (ref 78.0–100.0)
Platelets: 227 10*3/uL (ref 150–400)
RBC: 5.95 MIL/uL — ABNORMAL HIGH (ref 4.22–5.81)
RDW: 13.9 % (ref 11.5–15.5)
WBC: 7.4 10*3/uL (ref 4.0–10.5)

## 2015-04-21 LAB — HEMOGLOBIN A1C
HEMOGLOBIN A1C: 6 % — AB (ref 4.8–5.6)
MEAN PLASMA GLUCOSE: 126 mg/dL

## 2015-04-21 MED ORDER — LABETALOL HCL 200 MG PO TABS
200.0000 mg | ORAL_TABLET | Freq: Two times a day (BID) | ORAL | Status: DC
  Administered 2015-04-21 (×2): 200 mg via ORAL
  Filled 2015-04-21 (×2): qty 1

## 2015-04-21 NOTE — Progress Notes (Signed)
24 hour urine started at 1057. Emelda Brothershristy Margerie Fraiser RN

## 2015-04-21 NOTE — Progress Notes (Signed)
*  PRELIMINARY RESULTS* Vascular Ultrasound Renal Artery Duplex has been completed.  Preliminary findings: No evidence of renal artery stenosis.  Farrel DemarkJill Eunice, RDMS, RVT  04/21/2015, 8:46 AM

## 2015-04-21 NOTE — Care Management Note (Addendum)
Case Management Note  Patient Details  Name: Stephen Delgado MRN: [id number] Date of Birth: Jun 28, 1965  Subjective/Objective:  Pt admitted for Accelerated Hypertension- Due to noncompliance with medications. Pt now has Editor, commissioninginsurance BCBS. Pt has support of son.                  Action/Plan: CM was able to speak with pt in regards to PCP referral. Pt states his son will have to get the information to a Physician that he would like to see [address] [place]. CM will check back with pt on 04-22-15 to get information in order for hospital f/u to be scheduled. No further needs at this time.    Expected Discharge Date:                  Expected Discharge Plan:  Home/Self Care  In-House Referral:  NA  Discharge planning Services  CM Consult (PCP-Referral)  Post Acute Care Choice:  NA Choice offered to:  NA  DME Arranged:  N/A DME Agency:  NA  HH Arranged:  NA HH Agency:  NA  Status of Service:  In process, will continue to follow  Medicare Important Message Given:    Date Medicare IM Given:    Medicare IM give by:    Date Additional Medicare IM Given:    Additional Medicare Important Message give by:     If discussed at Long Length of Stay Meetings, dates discussed:    Additional Comments:1023 04-23-15 Stephen BambergerBrenda Graves-Bigelow, Stephen Delgado,Stephen Delgado [phone] CM did speak with Internal Medicine Clinic Manager Stephen Delgado and she was able to schedule a hospital f/u for pt on Monday March 27 at 1015. Appointment placed on AVS and pt is aware. No further needs from CM at this time.    1603 04-22-15 Stephen BambergerBrenda Graves-Bigelow, Stephen Delgado,Stephen Delgado [phone] CM did speak with pt in regards to PCP and he wanted CM to call the Internal Medicine Clinic to see if taking new patient's and they are. CM did leave vm with Doris- Production designer, theatre/television/filmManager at the clinic.    Stephen Delgado, Stephen Sookdeo Kaye, Stephen Delgado 04/21/2015, 1:08 PM

## 2015-04-21 NOTE — Consult Note (Signed)
CARDIOLOGY CONSULT NOTE   Patient ID: Stephen Delgado MRN: [id number], DOB/AGE: 1965/07/08   Admit date: 04/19/2015 Date of Consult: 04/21/2015   Primary Physician: No primary care provider on file. Primary Cardiologist: New   Pt. Profile  Stephen Delgado is a pleasant 50 year old African-American male with past medical history of hypertension present with dyspnea that woke him up from sleep and found to have SBP >200   Problem List  Past Medical History  Diagnosis Date  . Hypertension     Past Surgical History  Procedure Laterality Date  . No past surgeries       Allergies  No Known Allergies  HPI   Stephen Delgado is a pleasant 14 year old African-American male with past medical history of hypertension. He has no other past cardiac history. He has not seen his PCP for more 9 year. He is only intermittently compliant with his lisinopril at home. He does have a blood pressure cuff at home, however does not check his blood pressure. He only take his medication roughly 2 out of 7 days per week. Due to cost issues and the loss of his job, he has not been followed by PCP for a while. 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 According to the patient, he got home on Wednesday morning 04/19/2015 around 1:30 AM. He immediately went back to sleep. Around 4:30 AM, he woke up feeling short of breath and has abdominal distention. The abdominal distention is helped with belching. However for the shortness of breath, he had to open a window in order to make him feel better. He sought medical attention at [facility] and found to have a blood pressure over 200. He denies any chest pain recently with or without exertion. He denies any lower extremity edema. He said today's episode was the only time he ever woke up feeling short of breath. Initial chest x-ray shows no acute process. Urine drug test was negative. Significant laboratory  finding on arrival was creatinine 1.36, potassium 3.4, BNP 61.3. EKG showed T-wave inversion in inferolateral leads with downsloping ST segment in the lateral leads. Echocardiogram obtained on 04/20/2015 showed EF 50-55% moderate LVH, grade 1 diastolic dysfunction, atrial septal aneurysm. Renal artery Doppler was negative for any stenosis bilaterally.  His lisinopril was increased from 20 mg to 40 mg. HCTZ and amlodipine were added. Unfortunately his creatinine jumped from 1.3 up to 1.8 on 3/20. HCTZ has since been held. He has also been placed on labetalol 200 mg twice a day. He is undergoing secondary hypertension workup including serum metanephrine, bringing aldosterone ratio. Cardiology has been consulted to help with controlling his blood pressure.   Inpatient Medications  . amLODipine  10 mg Oral Daily  . aspirin EC  81 mg Oral Daily  . enoxaparin (LOVENOX) injection  40 mg Subcutaneous Q24H  . labetalol  200 mg Oral BID  . lisinopril  40 mg Oral Daily  . loratadine  10 mg Oral Daily  . sodium chloride flush  3 mL Intravenous Q12H    Family History Family History  Problem Relation Age of Onset  . Stroke Paternal Uncle      Social History Social History   Social History  . Marital Status: Married    Spouse Name: N/A  . Number of Children: N/A  . Years of Education: N/A   Occupational History  . Not on file.   Social History  Main Topics  . Smoking status: Never Smoker   . Smokeless tobacco: Not on file  . Alcohol Use: No  . Drug Use: No  . Sexual Activity: Not on file   Other Topics Concern  . Not on file   Social History Narrative     Review of Systems  General:  No chills, fever, night sweats or weight changes.  Cardiovascular:  No chest pain, dyspnea on exertion, edema, orthopnea, palpitations.  +paroxysmal nocturnal dyspnea Dermatological: No rash, lesions/masses Respiratory: No cough, dyspnea Urologic: No hematuria, dysuria Abdominal:   No nausea,  vomiting, diarrhea, bright red blood per rectum, melena, or hematemesis +abdominal distension improved with belching Neurologic:  No visual changes, wkns, changes in mental status. All other systems reviewed and are otherwise negative except as noted above.  Physical Exam  Blood pressure 132/95, pulse 77, temperature 98.7 F (37.1 C), temperature source Oral, resp. rate 18, height 6' (1.829 m), weight 177 lb 9.6 oz (80.559 kg), SpO2 100 %.  General: Pleasant, NAD Psych: Normal affect. Neuro: Alert and oriented X 3. Moves all extremities spontaneously. HEENT: Normal  Neck: Supple without bruits or JVD. Lungs:  Resp regular and unlabored, CTA. Heart: RRR no s3, s4, or murmurs. Abdomen: Soft, non-tender, non-distended, BS + x 4.  Extremities: No clubbing, cyanosis or edema. DP/PT/Radials 2+ and equal bilaterally.  Labs  No results for input(s): CKTOTAL, CKMB, TROPONINI in the last 72 hours. Lab Results  Component Value Date   WBC 7.4 04/21/2015   HGB 15.1 04/21/2015   HCT 44.9 04/21/2015   MCV 75.5* 04/21/2015   PLT 227 04/21/2015    Recent Labs Lab 04/19/15 0933  04/21/15 0757  NA 140  < > 143  K 3.4*  < > 3.6  CL 102  < > 104  CO2 28  < > 27  BUN 13  < > 18  CREATININE 1.36*  < > 1.80*  CALCIUM 9.1  < > 9.3  PROT 7.4  --   --   BILITOT 0.5  --   --   ALKPHOS 56  --   --   ALT 44  --   --   AST 30  --   --   GLUCOSE 121*  < > 138*  < > = values in this interval not displayed. Lab Results  Component Value Date   CHOL 142 04/20/2015   HDL 40* 04/20/2015   LDLCALC 88 04/20/2015   TRIG 70 04/20/2015   No results found for: DDIMER  Radiology/Studies  Dg Chest 2 View  04/19/2015  CLINICAL DATA:  Shortness of breath with exertion.  Hypertension. EXAM: CHEST  2 VIEW COMPARISON:  None. FINDINGS: There is no edema or consolidation. The heart size and pulmonary vascularity are normal. No adenopathy. There is thoracolumbar dextroscoliosis with upper lumbar levoscoliosis.  IMPRESSION: No edema or consolidation. Electronically Signed   By: Bretta BangWilliam  Woodruff III M.D.   On: 04/19/2015 11:35    ECG  NSR with TWI in inferolateral leads.    ASSESSMENT AND PLAN  1. 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 Moderate LVH support the diagnosis of chronically uncontrolled hypertension. I have had a long discussion with the patient to discuss potential risk of uncontrolled high blood pressure and its effect on his heart long term.   - given rising Cr, will stop lisinopril as well, continue amlodipine 10mg  daily  and labetalol  BID. May consider increase labetalol to  BID. Will reassess tomorrow, potentially add lower dose of ACEI if renal function is a concern.  2. Abnormal EKG, likely strain from uncontrolled BP  - no exertional symptom, may consider outpatient myoview, does not need to be done urgently   Signed, Azalee Course, PA-C 04/21/2015, 3:03 PM  I have examined the patient and reviewed assessment and plan and discussed with patient.  Agree with above as stated.  I spoke to the patient and wife.  He needs to stay on BP meds regularly to prevent end organ damage. COntinue amlodipine and labetolol.  He may tolerate a low dose of lisinopril, and receive the secondary benefits of ACE-I therapy.  I don't think he is having sx of ischemia.  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  If sx change, could consider a functional study.  Sabrin Dunlevy S.

## 2015-04-21 NOTE — Progress Notes (Signed)
[facility]                                                                              Patient Demographics  Stephen Delgado, is a 50 y.o. male, DOB - 12-03-1965, ZOX:[id number]  Admit date - 04/19/2015   Admitting Physician Clydia Llano, MD  Outpatient Primary MD for the patient is No primary care provider on file.  LOS -   days    Chief Complaint  Patient presents with  . Hypertension       Brief HPI   50 year old [place] male with long-standing history of hypertension. His primary care physician recently retired and he is in the process of establishing with a new physician who he describes as "an [place] doctor". Patient reported that he typically forgets to take his blood pressure pills. He is able to afford his medication (Lisinopril) which is available at [facility] for $4. He reported for the past 4 days, he noticed dyspnea on exertion, increased belching symptoms as well as increased flatus. He has not had any abdominal pain or no GI symptoms, no postprandial symptoms. He denied any peripheral edema or orthopnea, chest pain or any diaphoresis.    Assessment & Plan    Principal Problem:   Accelerated hypertension: Due to noncompliance, has a long-standing history, then lost insurance, PCP retired. Patient was taking lisinopril ($4 on [employer] list ) sporadically. Patient now reports that he has insurance. - Patient's BP continues to be elevated, lisinopril was increased to 40 mg daily, HCTZ has now been discontinued due to creatinine trending up to 1.8. Norvasc was increased to 10 mg daily.  - Also placed on labetalol 200 mg twice a day today - continue hydralazine as needed with parameters - TSH 1.6, renin aldosterone pending, serum metanephrines, urine VMA, metanephrines pending. UDS negative - Renal artery duplex negative for any renal artery stenosis - Cardiology consult requested for assistance, 2-D echo showed EF of 55% with grade 1 diastolic dysfunction,  atrial septal aneurysm   Active Problems:   Abnormal EKG - EKG reviewed with Dr. Jens Som this morning, shows LVH but no ST elevations . Repeat EKG showed T-wave inversions in the lateral leads and inferior leads. - 2-D echo showed EF of 55% with grade 1 diastolic dysfunction    AKI (acute kidney injury) (HCC): May have a component of chronic kidney disease due to hypertensive nephropathy - Creatinine on admission 1.3 likely due to hypertensive nephropathy however worsened to 1.8 today due to HCTZ., Await further cardiology recommendations    DOE (dyspnea on exertion) -Improving, 2-D echo showed 50 EF 55% with grade 1 diastolic dysfunction     Microcytosis - Outpatient workup, iron saturation ratio 17, ferritin 96, TIBC U IBC within normal range   Code Status: Full code   Family Communication: Discussed in detail with the patient, all imaging results, lab results explained to the patient   Disposition Plan: Likely DC in am   Time Spent in minutes  25 minutes  Procedures  Renal artery duplex 2-D echo  Consults   Cardiology  DVT Prophylaxis  Lovenox  Medications  Scheduled Meds: . amLODipine  10  mg Oral Daily  . aspirin EC  81 mg Oral Daily  . enoxaparin (LOVENOX) injection  40 mg Subcutaneous Q24H  . labetalol  200 mg Oral BID  . lisinopril  40 mg Oral Daily  . loratadine  10 mg Oral Daily  . sodium chloride flush  3 mL Intravenous Q12H   Continuous Infusions:  PRN Meds:.acetaminophen **OR** acetaminophen, alum & mag hydroxide-simeth, hydrALAZINE, labetalol   Antibiotics   Anti-infectives    None        Subjective:   Stephen Delgado was seen and examined today. Today feeling better, although blood pressure still not better controlled. Fluctuating. No headache or blurred vision.  Patient denies dizziness, chest pain, shortness of breath, abdominal pain, N/V/D/C, new weakness, numbess, tingling.   Objective:   Filed Vitals:   04/20/15 2330 04/20/15 2345  04/21/15 0500 04/21/15 0736  BP: 172/102 141/71 149/96 166/102  Pulse:   84 84  Temp:   97 F (36.1 C) 98.9 F (37.2 C)  TempSrc:   Oral Oral  Resp:    18  Height:      Weight:   80.559 kg (177 lb 9.6 oz)   SpO2:   100% 100%    Intake/Output Summary (Last 24 hours) at 04/21/15 1346 Last data filed at 04/21/15 0836  Gross per 24 hour  Intake    243 ml  Output      0 ml  Net    243 ml     Wt Readings from Last 3 Encounters:  04/21/15 80.559 kg (177 lb 9.6 oz)     Exam  General: Alert and oriented x 3, NAD  HEENT:    Neck:   CVS: S1 S2clear, RRR  Respiratory: CTAB  Abdomen: Soft, nontender, nondistended, + bowel sounds  Ext: no cyanosis clubbing or edema  Neuro: no new deficits  Skin: No rashes  Psych: Normal affect and demeanor, alert and oriented x3    Data Reviewed:  I have personally reviewed following labs and imaging studies  Micro Results No results found for this or any previous visit (from the past 240 hour(s)).  Radiology Reports Dg Chest 2 View  04/19/2015  CLINICAL DATA:  Shortness of breath with exertion.  Hypertension. EXAM: CHEST  2 VIEW COMPARISON:  None. FINDINGS: There is no edema or consolidation. The heart size and pulmonary vascularity are normal. No adenopathy. There is thoracolumbar dextroscoliosis with upper lumbar levoscoliosis. IMPRESSION: No edema or consolidation. Electronically Signed   By: Bretta Bang III M.D.   On: 04/19/2015 11:35    CBC  Recent Labs Lab 04/19/15 0933 04/21/15 0757  WBC 6.8 7.4  HGB 14.0 15.1  HCT 43.5 44.9  PLT 199 227  MCV 75.9* 75.5*  MCH 24.4* 25.4*  MCHC 32.2 33.6  RDW 13.6 13.9  LYMPHSABS 1.8  --   MONOABS 0.2  --   EOSABS 0.0  --   BASOSABS 0.0  --     Chemistries   Recent Labs Lab 04/19/15 0933 04/19/15 1822 04/20/15 0504 04/21/15 0757  NA 140  --  144 143  K 3.4*  --  3.8 3.6  CL 102  --  105 104  CO2 28  --  29 27  GLUCOSE 121*  --  109* 138*  BUN 13  --  16 18    CREATININE 1.36*  --  1.33* 1.80*  CALCIUM 9.1  --  9.4 9.3  MG  --  2.2  --   --  AST 30  --   --   --   ALT 44  --   --   --   ALKPHOS 56  --   --   --   BILITOT 0.5  --   --   --    ------------------------------------------------------------------------------------------------------------------ estimated creatinine clearance is 53.3 mL/min (by C-G formula based on Cr of 1.8). ------------------------------------------------------------------------------------------------------------------  Recent Labs  04/19/15 1822  HGBA1C 6.0*   ------------------------------------------------------------------------------------------------------------------  Recent Labs  04/20/15 0504  CHOL 142  HDL 40*  LDLCALC 88  TRIG 70  CHOLHDL 3.6   ------------------------------------------------------------------------------------------------------------------  Recent Labs  04/19/15 1822  TSH 1.673   ------------------------------------------------------------------------------------------------------------------  Recent Labs  04/19/15 1527  VITAMINB12 1134*  FOLATE 10.6  FERRITIN 96  TIBC 329  IRON 55  RETICCTPCT 1.8    Coagulation profile No results for input(s): INR, PROTIME in the last 168 hours.  No results for input(s): DDIMER in the last 72 hours.  Cardiac Enzymes No results for input(s): CKMB, TROPONINI, MYOGLOBIN in the last 168 hours.  Invalid input(s): CK ------------------------------------------------------------------------------------------------------------------ Invalid input(s): POCBNP  No results for input(s): GLUCAP in the last 72 hours.   Tryphena Perkovich M.D. [facility] 04/21/2015, 1:46 PM  Pager: [phone] Between 7am to 7pm - call Pager - [phone]  After 7pm go to [email or web address] - password [email or web address]  Call night coverage person covering after 7pm

## 2015-04-22 DIAGNOSIS — N189 Chronic kidney disease, unspecified: Secondary | ICD-10-CM | POA: Diagnosis present

## 2015-04-22 DIAGNOSIS — I160 Hypertensive urgency: Secondary | ICD-10-CM | POA: Diagnosis present

## 2015-04-22 DIAGNOSIS — N179 Acute kidney failure, unspecified: Secondary | ICD-10-CM | POA: Diagnosis not present

## 2015-04-22 DIAGNOSIS — I253 Aneurysm of heart: Secondary | ICD-10-CM | POA: Diagnosis present

## 2015-04-22 DIAGNOSIS — I10 Essential (primary) hypertension: Secondary | ICD-10-CM | POA: Diagnosis not present

## 2015-04-22 DIAGNOSIS — R0602 Shortness of breath: Secondary | ICD-10-CM | POA: Diagnosis present

## 2015-04-22 DIAGNOSIS — R9431 Abnormal electrocardiogram [ECG] [EKG]: Secondary | ICD-10-CM | POA: Diagnosis not present

## 2015-04-22 DIAGNOSIS — R718 Other abnormality of red blood cells: Secondary | ICD-10-CM | POA: Diagnosis present

## 2015-04-22 DIAGNOSIS — Y92230 Patient room in hospital as the place of occurrence of the external cause: Secondary | ICD-10-CM | POA: Diagnosis not present

## 2015-04-22 DIAGNOSIS — Z823 Family history of stroke: Secondary | ICD-10-CM | POA: Diagnosis not present

## 2015-04-22 DIAGNOSIS — I1310 Hypertensive heart and chronic kidney disease without heart failure, with stage 1 through stage 4 chronic kidney disease, or unspecified chronic kidney disease: Secondary | ICD-10-CM | POA: Diagnosis present

## 2015-04-22 DIAGNOSIS — E876 Hypokalemia: Secondary | ICD-10-CM | POA: Diagnosis present

## 2015-04-22 DIAGNOSIS — Z9114 Patient's other noncompliance with medication regimen: Secondary | ICD-10-CM | POA: Diagnosis not present

## 2015-04-22 DIAGNOSIS — T502X5A Adverse effect of carbonic-anhydrase inhibitors, benzothiadiazides and other diuretics, initial encounter: Secondary | ICD-10-CM | POA: Diagnosis not present

## 2015-04-22 LAB — CBC
HCT: 42.7 % (ref 39.0–52.0)
Hemoglobin: 13.6 g/dL (ref 13.0–17.0)
MCH: 24.2 pg — ABNORMAL LOW (ref 26.0–34.0)
MCHC: 31.9 g/dL (ref 30.0–36.0)
MCV: 76 fL — ABNORMAL LOW (ref 78.0–100.0)
Platelets: 206 10*3/uL (ref 150–400)
RBC: 5.62 MIL/uL (ref 4.22–5.81)
RDW: 13.8 % (ref 11.5–15.5)
WBC: 7.8 10*3/uL (ref 4.0–10.5)

## 2015-04-22 LAB — BASIC METABOLIC PANEL
Anion gap: 11 (ref 5–15)
BUN: 28 mg/dL — AB (ref 6–20)
CALCIUM: 9.5 mg/dL (ref 8.9–10.3)
CO2: 27 mmol/L (ref 22–32)
Chloride: 104 mmol/L (ref 101–111)
Creatinine, Ser: 2.03 mg/dL — ABNORMAL HIGH (ref 0.61–1.24)
GFR calc Af Amer: 42 mL/min — ABNORMAL LOW (ref >60)
GFR, EST NON AFRICAN AMERICAN: 36 mL/min — AB (ref >60)
GLUCOSE: 121 mg/dL — AB (ref 65–99)
Potassium: 3.7 mmol/L (ref 3.5–5.1)
Sodium: 142 mmol/L (ref 135–145)

## 2015-04-22 MED ORDER — LABETALOL HCL 200 MG PO TABS
400.0000 mg | ORAL_TABLET | Freq: Two times a day (BID) | ORAL | Status: DC
  Administered 2015-04-22 – 2015-04-23 (×3): 400 mg via ORAL
  Filled 2015-04-22 (×3): qty 2

## 2015-04-22 MED ORDER — SODIUM CHLORIDE 0.9 % IV SOLN
INTRAVENOUS | Status: DC
  Administered 2015-04-22: 75 mL/h via INTRAVENOUS
  Administered 2015-04-22: 23:00:00 via INTRAVENOUS

## 2015-04-22 MED ORDER — HYDRALAZINE HCL 25 MG PO TABS
25.0000 mg | ORAL_TABLET | Freq: Two times a day (BID) | ORAL | Status: DC
  Administered 2015-04-22 (×2): 25 mg via ORAL
  Filled 2015-04-22 (×3): qty 1

## 2015-04-22 NOTE — Progress Notes (Signed)
SUBJECTIVE:  Did not sleep well, did have some belching and abdominal discomfort this morning which has resolved.  OBJECTIVE:   Vitals:   Filed Vitals:   04/21/15 2202 04/22/15 0021 04/22/15 0500 04/22/15 0748  BP: 147/79 147/86 150/87 155/102  Pulse: 80 73 75 81  Temp:  98.3 F (36.8 C) 98.4 F (36.9 C) 98.2 F (36.8 C)  TempSrc:  Oral Oral Oral  Resp:      Height:      Weight:   179 lb 9.6 oz (81.466 kg)   SpO2:  99% 100% 100%   I&O's:   Intake/Output Summary (Last 24 hours) at 04/22/15 1054 Last data filed at 04/22/15 0900  Gross per 24 hour  Intake    480 ml  Output   1125 ml  Net   -645 ml   TELEMETRY: Reviewed telemetry pt in NSR:     PHYSICAL EXAM General: Well developed, well nourished, in no acute distress Head:   Normal cephalic and atramatic  Lungs: Clear bilaterally to auscultation. Heart: HRRR S1 S2  No JVD.   Abdomen: abdomen soft and non-tender Extremities:  No edema.   Neuro: Alert and oriented. Psych:  Normal affect, responds appropriately Skin: No rash   LABS: Basic Metabolic Panel:  Recent Labs  45/40/9803/18/17 1822  04/21/15 0757 04/22/15 0328  NA  --   < > 143 142  K  --   < > 3.6 3.7  CL  --   < > 104 104  CO2  --   < > 27 27  GLUCOSE  --   < > 138* 121*  BUN  --   < > 18 28*  CREATININE  --   < > 1.80* 2.03*  CALCIUM  --   < > 9.3 9.5  MG 2.2  --   --   --   < > = values in this interval not displayed. Liver Function Tests: No results for input(s): AST, ALT, ALKPHOS, BILITOT, PROT, ALBUMIN in the last 72 hours. No results for input(s): LIPASE, AMYLASE in the last 72 hours. CBC:  Recent Labs  04/21/15 0757 04/22/15 0328  WBC 7.4 7.8  HGB 15.1 13.6  HCT 44.9 42.7  MCV 75.5* 76.0*  PLT 227 206   Cardiac Enzymes: No results for input(s): CKTOTAL, CKMB, CKMBINDEX, TROPONINI in the last 72 hours. BNP: Invalid input(s): POCBNP D-Dimer: No results for input(s): DDIMER in the last 72 hours. Hemoglobin A1C:  Recent Labs  04/19/15 1822  HGBA1C 6.0*   Fasting Lipid Panel:  Recent Labs  04/20/15 0504  CHOL 142  HDL 40*  LDLCALC 88  TRIG 70  CHOLHDL 3.6   Thyroid Function Tests:  Recent Labs  04/19/15 1822  TSH 1.673   Anemia Panel:  Recent Labs  04/19/15 1527  VITAMINB12 1134*  FOLATE 10.6  FERRITIN 96  TIBC 329  IRON 55  RETICCTPCT 1.8   Coag Panel:   No results found for: INR, PROTIME  RADIOLOGY: Dg Chest 2 View  04/19/2015  CLINICAL DATA:  Shortness of breath with exertion.  Hypertension. EXAM: CHEST  2 VIEW COMPARISON:  None. FINDINGS: There is no edema or consolidation. The heart size and pulmonary vascularity are normal. No adenopathy. There is thoracolumbar dextroscoliosis with upper lumbar levoscoliosis. IMPRESSION: No edema or consolidation. Electronically Signed   By: Bretta BangWilliam  Woodruff III M.D.   On: 04/19/2015 11:35      ASSESSMENT: 50 year old male presented with hypertensive urgency, now improved  but with worsening renal function  PLAN:   Essential HTN  - Workup for secondary causes is pending will need to be followed up outpatient - BP has improved greatly with Amlodipine  daily and Labetalol  BID - Suspect BP will continue to improve - Will need to establish with PCP  Acute Kidney Injury - BUN and Cr up slightly today - Likely secondary to ACEi restating.  ACE and HCTZ currently held.  Gust Rung, DO  04/22/2015  10:54 AM   I have examined the patient and reviewed assessment and plan and discussed with patient.  Agree with above as stated.  Cr increased somewhat today, up to 2.  COntinue current meds for BP and plan for discharge once Cr improves.  Down the road, would consider low dose ACE-I once Cr normalizes.  He may tolerate lisinopril 10 mg daily and there are secondary benefits to being on an ACE-I.   Stephen Delgado S.

## 2015-04-22 NOTE — Progress Notes (Signed)
[facility]                                                                              Patient Demographics  Stephen Delgado, is a 50 y.o. male, DOB - 03/02/1965, ZOX:[id number]  Admit date - 04/19/2015   Admitting Physician Clydia LlanoMutaz Elmahi, MD  Outpatient Primary MD for the patient is No primary care provider on file.  LOS -   days    Chief Complaint  Patient presents with  . Hypertension       Brief HPI   50 year old [place] male with long-standing history of hypertension. His primary care physician recently retired and he is in the process of establishing with a new physician who he describes as "an [place] doctor". Patient reported that he typically forgets to take his blood pressure pills. He is able to afford his medication (Lisin[facility]) [facility] He reported for the past 4 days, he noticed dyspnea on exertion, increased belching symptoms as well as increased flatus. He has not had any abdominal pain or no GI symptoms, no postprandial symptoms. He denied any peripheral edema or orthopnea, chest pain or any diaphoresis.   Interim summary Patient was admitted with accelerated hypertension, likely due to noncompliance. Patient was placed on lisinopril, HCTZ, Norvasc however creatinine trended up hence lisinopril and HCTZ discontinued. Renal artery duplex negative for renal artery stenosis, TSH normal, UDS negative, renin/aldosterone in process, urine metanephrines, VMA, catecholamines in process. Cardiology was consulted. Hopefully DC home in a.m. if BP improving and creatinine improving  Assessment & Plan    Principal Problem:   Accelerated hypertension: Due to noncompliance, has a long-standing history, then lost insurance, PCP retired. Patient was taking lisinopril ($4 on [facility] list ) sporadically. Patient now reports that he has insurance. - Lisinopril, HCTZ discontinued due to creatinine bumped to 2.0  - Placed on labetalol,  Norvasc. Labetalol increased to 400 mg twice a day, placed on hydralazine today. - TSH 1.6, renin aldosterone pending, serum metanephrines, urine VMA, metanephrines pending. UDS negative - Renal artery duplex negative for any renal artery stenosis - Cardiology consult requested for assistance, 2-D echo showed EF of 55% with grade 1 diastolic dysfunction, atrial septal aneurysm   Active Problems:   Abnormal EKG - EKG reviewed with Dr. Jens Somrenshaw shows LVH but no ST elevations . Repeat EKG showed T-wave inversions in the lateral leads and inferior leads. - 2-D echo showed EF of 55% with grade 1 diastolic dysfunction    AKI (acute kidney injury) (HCC): May have a component of chronic kidney disease due to hypertensive nephropathy - Creatinine on admission 1.3 likely due to hypertensive nephropathy however worsened to 2.0 due to HCTZ., ACEI - Placed on gentle hydration    DOE (dyspnea on exertion) -Improving, 2-D echo showed 50 EF 55% with grade 1 diastolic dysfunction     Microcytosis - Outpatient workup, iron saturation ratio 17, ferritin 96, TIBC U IBC within normal range   Code Status: Full code   Family Communication: Discussed in detail with the patient, all imaging results, lab results explained to the patient   Disposition Plan: Likely DC  in am   Time Spent in minutes  25 minutes  Procedures  Renal artery duplex 2-D echo  Consults   Cardiology  DVT Prophylaxis  Lovenox  Medications  Scheduled Meds: . amLODipine  10 mg Oral Daily  . aspirin EC  81 mg Oral Daily  . enoxaparin (LOVENOX) injection  40 mg Subcutaneous Q24H  . hydrALAZINE  25 mg Oral BID  . labetalol  400 mg Oral BID  . loratadine  10 mg Oral Daily  . sodium chloride flush  3 mL Intravenous Q12H   Continuous Infusions: . sodium chloride 75 mL/hr (04/22/15 0816)   PRN Meds:.acetaminophen **OR** acetaminophen, alum & mag hydroxide-simeth, hydrALAZINE, labetalol   Antibiotics   Anti-infectives     None        Subjective:   Eran Mistry was seen and examined today. Feeling somewhat anxious today, pacing in the room. Denies any headache, blurry vision, chest pain or shortness of breath. No abdominal pain, N/V/D/C, new weakness, numbess, tingling.   Objective:   Filed Vitals:   04/21/15 2202 04/22/15 0021 04/22/15 0500 04/22/15 0748  BP: 147/79 147/86 150/87 155/102  Pulse: 80 73 75 81  Temp:  98.3 F (36.8 C) 98.4 F (36.9 C) 98.2 F (36.8 C)  TempSrc:  Oral Oral Oral  Resp:      Height:      Weight:   81.466 kg (179 lb 9.6 oz)   SpO2:  99% 100% 100%    Intake/Output Summary (Last 24 hours) at 04/22/15 1207 Last data filed at 04/22/15 0900  Gross per 24 hour  Intake    480 ml  Output   1125 ml  Net   -645 ml     Wt Readings from Last 3 Encounters:  04/22/15 81.466 kg (179 lb 9.6 oz)     Exam  General: Alert and oriented x 3, NAD  HEENT:    Neck:   CVS: S1 S2clear, RRR  Respiratory: Clear to auscultation bilaterally, no wheezing or rhonchi  Abdomen: Soft, nontender, nondistended, + bowel sounds  Ext: no cyanosis clubbing or edema  Neuro: no new deficits  Skin: No rashes  Psych: Normal affect and demeanor, alert and oriented x3    Data Reviewed:  I have personally reviewed following labs and imaging studies  Micro Results No results found for this or any previous visit (from the past 240 hour(s)).  Radiology Reports Dg Chest 2 View  04/19/2015  CLINICAL DATA:  Shortness of breath with exertion.  Hypertension. EXAM: CHEST  2 VIEW COMPARISON:  None. FINDINGS: There is no edema or consolidation. The heart size and pulmonary vascularity are normal. No adenopathy. There is thoracolumbar dextroscoliosis with upper lumbar levoscoliosis. IMPRESSION: No edema or consolidation. Electronically Signed   By: Bretta Bang III M.D.   On: 04/19/2015 11:35    CBC  Recent Labs Lab 04/19/15 0933 04/21/15 0757 04/22/15 0328  WBC 6.8 7.4 7.8  HGB  14.0 15.1 13.6  HCT 43.5 44.9 42.7  PLT 199 227 206  MCV 75.9* 75.5* 76.0*  MCH 24.4* 25.4* 24.2*  MCHC 32.2 33.6 31.9  RDW 13.6 13.9 13.8  LYMPHSABS 1.8  --   --   MONOABS 0.2  --   --   EOSABS 0.0  --   --   BASOSABS 0.0  --   --     Chemistries   Recent Labs Lab 04/19/15 0933 04/19/15 1822 04/20/15 0504 04/21/15 0757 04/22/15 0328  NA 140  --  144 143 142  K 3.4*  --  3.8 3.6 3.7  CL 102  --  105 104 104  CO2 28  --  GLUCOSE 121*  --  109* 138* 121*  BUN 13  --  16 18 28*  CREATININE 1.36*  --  1.33* 1.80* 2.03*  CALCIUM 9.1  --  9.4 9.3 9.5  MG  --  2.2  --   --   --   AST 30  --   --   --   --   ALT 44  --   --   --   --   ALKPHOS 56  --   --   --   --   BILITOT 0.5  --   --   --   --    ------------------------------------------------------------------------------------------------------------------ estimated creatinine clearance is 47.3 mL/min (by C-G formula based on Cr of 2.03). ------------------------------------------------------------------------------------------------------------------  Recent Labs  04/19/15 1822  HGBA1C 6.0*   ------------------------------------------------------------------------------------------------------------------  Recent Labs  04/20/15 0504  CHOL 142  HDL 40*  LDLCALC 88  TRIG 70  CHOLHDL 3.6   ------------------------------------------------------------------------------------------------------------------  Recent Labs  04/19/15 1822  TSH 1.673   ------------------------------------------------------------------------------------------------------------------  Recent Labs  04/19/15 1527  VITAMINB12 1134*  FOLATE 10.6  FERRITIN 96  TIBC 329  IRON 55  RETICCTPCT 1.8    Coagulation profile No results for input(s): INR, PROTIME in the last 168 hours.  No results for input(s): DDIMER in the last 72 hours.  Cardiac Enzymes No results for input(s): CKMB, TROPONINI, MYOGLOBIN in the last  168 hours.  Invalid input(s): CK ------------------------------------------------------------------------------------------------------------------ Invalid input(s): POCBNP  No results for input(s): GLUCAP in the last 72 hours.   Neola Worrall M.D. [facility] 04/22/2015, 12:07 PM  Pager: [phone] Between 7am to 7pm - call Pager - [phone]  After 7pm go to [email or web address] - password [email or web address]  Call night coverage person covering after 7pm

## 2015-04-23 LAB — CBC
HEMATOCRIT: 41.8 % (ref 39.0–52.0)
Hemoglobin: 13.4 g/dL (ref 13.0–17.0)
MCH: 24.4 pg — ABNORMAL LOW (ref 26.0–34.0)
MCHC: 32.1 g/dL (ref 30.0–36.0)
MCV: 76 fL — ABNORMAL LOW (ref 78.0–100.0)
PLATELETS: 209 10*3/uL (ref 150–400)
RBC: 5.5 MIL/uL (ref 4.22–5.81)
RDW: 13.9 % (ref 11.5–15.5)
WBC: 6.9 10*3/uL (ref 4.0–10.5)

## 2015-04-23 LAB — BASIC METABOLIC PANEL
Anion gap: 8 (ref 5–15)
BUN: 24 mg/dL — AB (ref 6–20)
CO2: 27 mmol/L (ref 22–32)
CREATININE: 1.49 mg/dL — AB (ref 0.61–1.24)
Calcium: 9.1 mg/dL (ref 8.9–10.3)
Chloride: 107 mmol/L (ref 101–111)
GFR calc Af Amer: >60 mL/min (ref >60)
GFR, EST NON AFRICAN AMERICAN: 53 mL/min — AB (ref >60)
GLUCOSE: 110 mg/dL — AB (ref 65–99)
POTASSIUM: 3.9 mmol/L (ref 3.5–5.1)
SODIUM: 142 mmol/L (ref 135–145)

## 2015-04-23 LAB — CATECHOLAMINES, FRACTIONATED, PLASMA
EPINEPHRINE: 82 pg/mL — AB (ref 0–62)
NOREPINEPHRINE: 591 pg/mL (ref 0–874)

## 2015-04-23 MED ORDER — LABETALOL HCL 200 MG PO TABS: 400.0000 mg | ORAL_TABLET | Freq: Two times a day (BID) | ORAL | Status: DC

## 2015-04-23 MED ORDER — HYDRALAZINE HCL 25 MG PO TABS
25.0000 mg | ORAL_TABLET | Freq: Three times a day (TID) | ORAL | Status: DC
  Administered 2015-04-23: 25 mg via ORAL
  Filled 2015-04-23: qty 1

## 2015-04-23 MED ORDER — AMLODIPINE BESYLATE 10 MG PO TABS: 10.0000 mg | ORAL_TABLET | Freq: Every day | ORAL | Status: DC

## 2015-04-23 MED ORDER — HYDRALAZINE HCL 25 MG PO TABS: 25.0000 mg | ORAL_TABLET | Freq: Three times a day (TID) | ORAL | Status: DC

## 2015-04-23 MED ORDER — LORATADINE 10 MG PO TABS: 10.0000 mg | ORAL_TABLET | Freq: Every day | ORAL | Status: DC

## 2015-04-23 NOTE — Discharge Summary (Signed)
Physician Discharge Summary   Patient ID: Stephen Delgado MRN: [id number] DOB/AGE: 1965/05/05 50 y.o.  Admit date: 04/19/2015 Discharge date: 04/23/2015  Primary Care Physician: The patient had no PCP prior to admission  Discharge Diagnoses:    . Accelerated hypertension . Abnormal EKG . AKI (acute kidney injury) (HCC) . Acute hypokalemia . DOE (dyspnea on exertion) . Microcytosis  Consults:  Cardiology  Recommendations for Outpatient Follow-up:  1. Patient is now established with internal medicine teaching service clinic 2. Please repeat CBC/BMET at next visit 3. Please follow renin/aldosterone, plasma and urine metanephrines, VMA, catecholamines   DIET: Heart healthy diet, low sodium    Allergies:  No Known Allergies   DISCHARGE MEDICATIONS: Current Discharge Medication List    START taking these medications   Details  amLODipine (NORVASC) 10 MG tablet Take 1 tablet (10 mg total) by mouth daily. Qty: 30 tablet, Refills: 3    hydrALAZINE (APRESOLINE) 25 MG tablet Take 1 tablet (25 mg total) by mouth 3 (three) times daily. Qty: 90 tablet, Refills: 3    labetalol (NORMODYNE) 200 MG tablet Take 2 tablets (400 mg total) by mouth 2 (two) times daily. Qty: 120 tablet, Refills: 3    loratadine (CLARITIN) 10 MG tablet Take 1 tablet (10 mg total) by mouth daily. Qty: 30 tablet, Refills: 3      CONTINUE these medications which have NOT CHANGED   Details  aspirin EC 81 MG tablet Take 81 mg by mouth daily.      STOP taking these medications     lisinopril (PRINIVIL,ZESTRIL) 20 MG tablet          Brief H and P: For complete details please refer to admission H and P, but in brief 50 year old [place] male with long-standing history of hypertension. His primary care physician recently retired and he is in the process of establishing with a new physician who he describes as "an [place] doctor". Patient reported that he typically forgets to take his blood pressure  pills. He is able to afford his medication (Lisinopril) which is available at [facility] for $4. He reported for the past 4 days, he noticed dyspnea on exertion, increased belching symptoms as well as increased flatus. He has not had any abdominal pain or no GI symptoms, no postprandial symptoms. He denied any peripheral edema or orthopnea, chest pain or any diaphoresis.   Hospital Course:   Accelerated hypertension: Due to noncompliance, has a long-standing history, then lost insurance, PCP retired. Patient was taking lisinopril ($4 on [facility] list ) sporadically. Patient now reports that he has insurance. - Patient was placed on HCTZ and lisinopril was increased during the hospitalization however it worsened his renal function and was hence discontinued. - Cardiology was consulted, patient was placed on labetalol, Norvasc and hydralazine and titrated up according to his BP readings. BP has been progressively improving. Patient was strongly counseled on compliance.  - TSH 1.6, renin aldosterone pending, serum metanephrines, urine VMA, metanephrines pending. UDS negative - Renal artery duplex negative for any renal artery stenosis - 2-D echo showed EF of 55% with grade 1 diastolic dysfunction, atrial septal aneurysm    Abnormal EKG - EKG reviewed with Dr. Jens Som shows LVH but no ST elevations . Repeat EKG showed T-wave inversions in the lateral leads and inferior leads. - 2-D echo showed EF of 55% with grade 1 diastolic dysfunction   AKI (acute kidney injury) (HCC): May have a component of chronic kidney disease due to hypertensive nephropathy - Creatinine on  admission 1.3 likely due to hypertensive nephropathy however worsened to 2.0 due to HCTZ., ACEI. HCTZ and lisinopril were discontinued, patient received gentle hydration, creatinine improved to 1.4 at the time of discharge.   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 HEENT: anicteric sclera, pupils reactive to light and accommodation CVS: S1-S2 clear no murmur rubs or gallops Chest: clear to auscultation bilaterally, no wheezing rales or rhonchi Abdomen: soft nontender, nondistended, normal bowel sounds Extremities: no cyanosis, clubbing or edema noted bilaterally Neuro: Cranial nerves II-XII intact, no focal neurological deficits   The results of significant diagnostics from this hospitalization (including imaging, microbiology, ancillary and laboratory) are listed below for reference.    LAB RESULTS: Basic Metabolic Panel:  Recent Labs Lab 04/19/15 1822  04/22/15 0328 04/23/15 0432  NA  --   < > 142 142  K  --   < > 3.7 3.9  CL  --   < > 104 107  CO2  --   < > 27 27  GLUCOSE  --   < > 121* 110*  BUN  --   < > 28* 24*  CREATININE  --   < > 2.03* 1.49*  CALCIUM  --   < > 9.5 9.1  MG 2.2  --   --   --   < > = values in this interval not displayed. Liver Function Tests:  Recent Labs Lab 04/19/15 0933  AST 30  ALT 44  ALKPHOS 56  BILITOT 0.5  PROT 7.4  ALBUMIN 3.8   No results for input(s): LIPASE, AMYLASE in the last 168 hours. No results for input(s): AMMONIA in the last 168 hours. CBC:  Recent Labs Lab 04/19/15 0933  04/22/15 0328 04/23/15 0432  WBC 6.8  < > 7.8 6.9  NEUTROABS 4.7  --   --   --   HGB 14.0  < > 13.6 13.4  HCT 43.5  < > 42.7 41.8  MCV 75.9*  < > 76.0* 76.0*  PLT 199  < > 206 209  < > = values in this interval not displayed. Cardiac Enzymes: No results for input(s): CKTOTAL, CKMB, CKMBINDEX, TROPONINI in the last 168  hours. BNP: Invalid input(s): POCBNP CBG: No results for input(s): GLUCAP in the last 168 hours.  Significant Diagnostic Studies:  Dg Chest 2 View  04/19/2015  CLINICAL DATA:  Shortness of breath with exertion.  Hypertension. EXAM: CHEST  2 VIEW COMPARISON:  None. FINDINGS: There is no edema or consolidation. The heart size and pulmonary vascularity are normal. No adenopathy. There is thoracolumbar dextroscoliosis with upper lumbar levoscoliosis. IMPRESSION: No edema or consolidation. Electronically Signed   By: Bretta BangWilliam  Woodruff III M.D.   On: 04/19/2015 11:35    2D ECHO: Study Conclusions  - Left ventricle: The cavity size was normal. Wall thickness was  increased in a pattern of moderate LVH. Systolic function was  normal. The estimated ejection fraction was in the range of 50%  to 55%. Wall motion  was normal; there were no regional wall  motion abnormalities. Doppler parameters are consistent with  abnormal left ventricular relaxation (grade 1 diastolic  dysfunction). - Aortic valve: There was trivial regurgitation. - Atrial septum: There was an atrial septal aneurysm.  Impressions:  - Low normal LV systolic function; moderate LVH; grade 1 diastolic  dysfunction; trace AI.   Disposition and Follow-up: Discharge Instructions    Diet - low sodium heart healthy    Complete by:  As directed      Discharge instructions    Complete by:  As directed   Please check your BP daily and keep a log for your appointment in the clinic. Low salt diet and exercise.     Increase activity slowly    Complete by:  As directed             DISPOSITION:home    DISCHARGE FOLLOW-UP Follow-up Information    Follow up with Internal Medicine Clinic On 04/28/2015.   Why:  @ 10:15 am for Hospital Follow Up/ Primary Care Establishment with Dr. Yetta Barre- Please bring Insurance Card, Photo ID, Medications to appointment   Contact information:   Ground Floor of [facility] [phone]       Time spent on Discharge:   Signed:   Yannick Steuber M.D. [facility] 04/23/2015, 11:13 AM Pager: [phone]

## 2015-04-23 NOTE — Progress Notes (Signed)
   SUBJECTIVE:  Feels better, no complaints, denies any chest pain, headache or stomach pain.  BP elevated some last night and this AM but overall improved.  OBJECTIVE:   Vitals:   Filed Vitals:   04/22/15 1922 04/23/15 0223 04/23/15 0649 04/23/15 0859  BP: 180/102 154/89 188/85 149/82  Pulse: 83 81 84   Temp: 98.1 F (36.7 C) 98.5 F (36.9 C) 98.5 F (36.9 C)   TempSrc: Oral Oral Oral   Resp: 17     Height:      Weight:      SpO2: 100% 100% 100%    I&O's:    Intake/Output Summary (Last 24 hours) at 04/23/15 [id number] Last data filed at 04/22/15 2243  Gross per 24 hour  Intake    360 ml  Output     50 ml  Net    310 ml   TELEMETRY: Reviewed telemetry pt in NSR:     PHYSICAL EXAM General: Well developed, well nourished, in no acute distress Head:   Normal cephalic and atramatic  Lungs: Clear bilaterally to auscultation. Heart: HRRR S1 S2  No JVD.   Abdomen: abdomen soft and non-tender Extremities:  No edema.   Neuro: Alert and oriented. Psych:  Normal affect, responds appropriately Skin: No rash   LABS: Basic Metabolic Panel:  Recent Labs  19/14/7803/21/17 0328 04/23/15 0432  NA 142 142  K 3.7 3.9  CL 104 107  CO2 27 27  GLUCOSE 121* 110*  BUN 28* 24*  CREATININE 2.03* 1.49*  CALCIUM 9.5 9.1   Liver Function Tests: No results for input(s): AST, ALT, ALKPHOS, BILITOT, PROT, ALBUMIN in the last 72 hours. No results for input(s): LIPASE, AMYLASE in the last 72 hours. CBC:  Recent Labs  04/22/15 0328 04/23/15 0432  WBC 7.8 6.9  HGB 13.6 13.4  HCT 42.7 41.8  MCV 76.0* 76.0*  PLT 206 209    RADIOLOGY: Dg Chest 2 View  04/19/2015  CLINICAL DATA:  Shortness of breath with exertion.  Hypertension. EXAM: CHEST  2 VIEW COMPARISON:  None. FINDINGS: There is no edema or consolidation. The heart size and pulmonary vascularity are normal. No adenopathy. There is thoracolumbar dextroscoliosis with upper lumbar levoscoliosis. IMPRESSION: No edema or consolidation.  Electronically Signed   By: Bretta BangWilliam  Woodruff III M.D.   On: 04/19/2015 11:35      ASSESSMENT: 50 year old male presented with hypertensive urgency, now improved but with worsening renal function  PLAN:   Essential HTN  - Workup for secondary causes is still pending will need to be followed up outpatient - BP overall improved with Amlodipine 10mg  daily and Labetalol 400mg  BID would discharge with this regimen and will need medication changes as outpatinet - Will need to establish with PCP, [facility], I will try to follow up with our office manager this morning to get him an appointment before discharge  Acute Kidney Injury - BUN and Cr improving - Likely secondary to ACEi restating.  ACE and HCTZ currently held. -Stable for discharge  Gust Rungrik C Hoffman, DO  04/23/2015  9:03 AM   I have examined the patient and reviewed assessment and plan and discussed with patient.  Agree with above as stated.  BP and renal function better.  Consider starting low dose ACE at a later time.  Once a day meds will be best for him.  Hopefully, Dr. Mikey BussingHoffman can follow as an outpatient.  Tukker Byrns S.

## 2015-04-24 LAB — METANEPHRINES, URINE, 24 HOUR
Metaneph Total, Ur: 150 ug/L
Metanephrines, 24H Ur: 203 ug/24 hr (ref 45–290)
NORMETANEPHRINE 24H UR: 586 ug/(24.h) — AB (ref 82–500)
NORMETANEPHRINE UR: 434 ug/L
Total Volume: 1350

## 2015-04-24 LAB — METANEPHRINES, PLASMA
Metanephrine, Free: 61 pg/mL (ref 0–62)
NORMETANEPHRINE FREE: 164 pg/mL — AB (ref 0–145)

## 2015-04-25 LAB — ALDOSTERONE + RENIN ACTIVITY W/ RATIO
ALDO / PRA RATIO: 6.7 (ref 0.0–30.0)
Aldosterone: 19.5 ng/dL (ref 0.0–30.0)
PRA LC/MS/MS: 2.902 ng/mL/hr (ref 0.167–5.380)

## 2015-04-28 ENCOUNTER — Encounter: Payer: Self-pay | Admitting: Internal Medicine

## 2015-04-28 ENCOUNTER — Ambulatory Visit (INDEPENDENT_AMBULATORY_CARE_PROVIDER_SITE_OTHER): Payer: BLUE CROSS/BLUE SHIELD | Admitting: Internal Medicine

## 2015-04-28 VITALS — BP 168/87 | HR 90 | Temp 98.2°F | Ht 72.0 in | Wt 184.8 lb

## 2015-04-28 DIAGNOSIS — R0981 Nasal congestion: Secondary | ICD-10-CM | POA: Diagnosis not present

## 2015-04-28 DIAGNOSIS — R7303 Prediabetes: Secondary | ICD-10-CM

## 2015-04-28 DIAGNOSIS — I10 Essential (primary) hypertension: Secondary | ICD-10-CM | POA: Diagnosis not present

## 2015-04-28 DIAGNOSIS — E119 Type 2 diabetes mellitus without complications: Secondary | ICD-10-CM | POA: Insufficient documentation

## 2015-04-28 DIAGNOSIS — N179 Acute kidney failure, unspecified: Secondary | ICD-10-CM | POA: Diagnosis not present

## 2015-04-28 MED ORDER — FLUTICASONE PROPIONATE 50 MCG/ACT NA SUSP: 2.0000 | Freq: Every day | NASAL | Status: DC

## 2015-04-28 NOTE — Assessment & Plan Note (Signed)
Recheck BMP today. May still benefit from ACEI if back to baseline.

## 2015-04-28 NOTE — Progress Notes (Signed)
   Subjective:   Patient ID: Stephen Delgado male   DOB: [birth year] 50 y.o.   MRN: [id number]  HPI: Stephen Delgado is a 50 y.o. male w/ PMHx of HTN, presents to the clinic today for a hospital follow-up visit. Patient was recently hospitalized for dyspnea and severely elevated BP mostly related to non-compliance. During his hospital admission, patient was started on HCTZ and had AKI related to this in addition to his previously prescribed ACEI. The admitting team also checked urine/plasma metanephrines, as well as renin/aldosterone levels which are normal. Also, patient does not exhibit any other findings suggestive of Pheo; no palpitations, diaphoresis, anxiety, etc.   Today, patient has moderate BP elevation, took his medications minutes prior to his visit. Since his admission, says he feels well. No chest pain, palpitations, dizziness, lightheadedness, nausea, or vomiting. Seems to have some mild gas and indigestion at times. Has been taking his blood pressure medicines as prescribed. Also describes some mild sleeping issues and sinus congestion.   Past Medical History  Diagnosis Date  . Hypertension    Current Outpatient Prescriptions  Medication Sig Dispense Refill  . amLODipine (NORVASC) 10 MG tablet Take 1 tablet (10 mg total) by mouth daily. 30 tablet 3  . aspirin EC 81 MG tablet Take 81 mg by mouth daily.    . hydrALAZINE (APRESOLINE) 25 MG tablet Take 1 tablet (25 mg total) by mouth 3 (three) times daily. 90 tablet 3  . labetalol (NORMODYNE) 200 MG tablet Take 2 tablets (400 mg total) by mouth 2 (two) times daily. 120 tablet 3  . loratadine (CLARITIN) 10 MG tablet Take 1 tablet (10 mg total) by mouth daily. 30 tablet 3   No current facility-administered medications for this visit.    Review of Systems: General: Denies fever, chills, diaphoresis, appetite change and fatigue.  Respiratory: Positive for intermittent SOB. Denies DOE, cough, and wheezing.   Cardiovascular: Denies  chest pain and palpitations.  Gastrointestinal: Denies nausea, vomiting, abdominal pain, and diarrhea.  Genitourinary: Denies dysuria, increased frequency, and flank pain. Endocrine: Denies hot or cold intolerance, polyuria, and polydipsia. Musculoskeletal: Denies myalgias, back pain, joint swelling, arthralgias and gait problem.  Skin: Denies pallor, rash and wounds.  Neurological: Denies dizziness, seizures, syncope, weakness, lightheadedness, numbness and headaches.  Psychiatric/Behavioral: Positive for mild sleep disturbance. Denies mood changes.  Objective:   Physical Exam: Filed Vitals:   04/28/15 1033  BP: 168/87  Pulse: 90  Temp: 98.2 F (36.8 C)  TempSrc: Oral  Height: 6' (1.829 m)  Weight: 184 lb 12.8 oz (83.825 kg)  SpO2: 100%    General: Alert, cooperative, NAD. HEENT: PERRL, EOMI. Moist mucus membranes Neck: Full range of motion without pain, supple, no lymphadenopathy or carotid bruits Lungs: Clear to ascultation bilaterally, normal work of respiration, no wheezes, rales, rhonchi Heart: RRR, no murmurs, gallops, or rubs Abdomen: Soft, non-tender, non-distended, BS + Extremities: No cyanosis, clubbing, or edema Neurologic: Alert & oriented X3, cranial nerves II-XII intact, strength grossly intact, sensation intact to light touch   Assessment & Plan:   Please see problem based assessment and plan.

## 2015-04-28 NOTE — Assessment & Plan Note (Signed)
Patient describes some mild seasonal sinus congestion and rhinorrhea.  -Flonase

## 2015-04-28 NOTE — Patient Instructions (Signed)
1. Please make a follow up in 2 weeks.   2. Please take all medications as previously prescribed with the following changes:  Continue Amlodipine 10 mg daily, Labetalol 400 mg twice daily, and Hydralazine 25 mg three times daily  I will call you if there are any lab abnormalities.   Keep taking your blood pressure medicines every day.   Eat healthy food, less carbohydrates.   3. If you have worsening of your symptoms or new symptoms arise, please call the clinic ([phone]), or go to the ER immediately if symptoms are severe.  You have done a great job in taking all your medications. Please continue to do this.    Prediabetes Eating Plan Prediabetes--also called impaired glucose tolerance or impaired fasting glucose--is a condition that causes blood sugar (blood glucose) levels to be higher than normal. Following a healthy diet can help to keep prediabetes under control. It can also help to lower the risk of type 2 diabetes and heart disease, which are increased in people who have prediabetes. Along with regular exercise, a healthy diet:  Promotes weight loss.  Helps to control blood sugar levels.  Helps to improve the way that the body uses insulin. WHAT DO I NEED TO KNOW ABOUT THIS EATING PLAN?  Use the glycemic index (GI) to plan your meals. The index tells you how quickly a food will raise your blood sugar. Choose low-GI foods. These foods take a longer time to raise blood sugar.  Pay close attention to the amount of carbohydrates in the food that you eat. Carbohydrates increase blood sugar levels.  Keep track of how many calories you take in. Eating the right amount of calories will help you to achieve a healthy weight. Losing about 7 percent of your starting weight can help to prevent type 2 diabetes.  You may want to follow a Mediterranean diet. This diet includes a lot of vegetables, lean meats or fish, whole grains, fruits, and healthy oils and fats. WHAT FOODS CAN I  EAT? Grains Whole grains, such as whole-wheat or whole-grain breads, crackers, cereals, and pasta. Unsweetened oatmeal. Bulgur. Barley. Quinoa. Brown rice. Corn or whole-wheat flour tortillas or taco shells. Vegetables Lettuce. Spinach. Peas. Beets. Cauliflower. Cabbage. Broccoli. Carrots. Tomatoes. Squash. Eggplant. Herbs. Peppers. Onions. Cucumbers. Brussels sprouts. Fruits Berries. Bananas. Apples. Oranges. Grapes. Papaya. Mango. Pomegranate. Kiwi. Grapefruit. Cherries. Meats and Other Protein Sources Seafood. Lean meats, such as chicken and [place] or lean cuts of pork and beef. Tofu. Eggs. Nuts. Beans. Dairy Low-fat or fat-free dairy products, such as yogurt, cottage cheese, and cheese. Beverages Water. Tea. Coffee. Sugar-free or diet soda. Seltzer water. Milk. Milk alternatives, such as soy or almond milk. Condiments Mustard. Relish. Low-fat, low-sugar ketchup. Low-fat, low-sugar barbecue sauce. Low-fat or fat-free mayonnaise. Sweets and Desserts Sugar-free or low-fat pudding. Sugar-free or low-fat ice cream and other frozen treats. Fats and Oils Avocado. Walnuts. Olive oil. The items listed above may not be a complete list of recommended foods or beverages. Contact your dietitian for more options.  WHAT FOODS ARE NOT RECOMMENDED? Grains Refined white flour and flour products, such as bread, pasta, snack foods, and cereals. Beverages Sweetened drinks, such as sweet iced tea and soda. Sweets and Desserts Baked goods, such as cake, cupcakes, pastries, cookies, and cheesecake. The items listed above may not be a complete list of foods and beverages to avoid. Contact your dietitian for more information.   This information is not intended to replace advice given to you by your health  care provider. Make sure you discuss any questions you have with your health care provider.   Document Released: 06/04/2014 Document Reviewed: 06/04/2014 Elsevier Interactive Patient Education [email or web address]  [employer]vier Inc.

## 2015-04-28 NOTE — Assessment & Plan Note (Signed)
BP Readings from Last 3 Encounters:  04/28/15 168/87  04/23/15 149/82    Lab Results  Component Value Date   NA 142 04/23/2015   K 3.9 04/23/2015   CREATININE 1.49* 04/23/2015    Assessment: Blood pressure control:  Elevated Progress toward BP goal:   Not currently at goal Comments: JUST took his BP medication prior to his visit. Currently taking Norvasc 10 mg daily, Hydralazine 25 mg tid, and Labetalol 400 mg bid. Would still likely benefit from an ACEI at some point.   Plan: Medications:  continue current medications.  Other plans: Check BMP. If Cr at baseline, could add Lisinopril 10 at next visit in 2 weeks.

## 2015-04-28 NOTE — Assessment & Plan Note (Signed)
HBA1c 6.0. Discussed lifestyle modification, diet, exercise. Given information on healthy diet. May benefit from referral to diabetes educator at next visit.

## 2015-04-29 LAB — BMP8+ANION GAP
Anion Gap: 17 mmol/L (ref 10.0–18.0)
BUN/Creatinine Ratio: 12 (ref 9–20)
BUN: 16 mg/dL (ref 6–24)
CO2: 20 mmol/L (ref 18–29)
Calcium: 9.3 mg/dL (ref 8.7–10.2)
Chloride: 103 mmol/L (ref 96–106)
Creatinine, Ser: 1.32 mg/dL — ABNORMAL HIGH (ref 0.76–1.27)
GFR calc Af Amer: 72 mL/min/{1.73_m2} (ref >59)
GFR calc non Af Amer: 62 mL/min/{1.73_m2} (ref >59)
Glucose: 170 mg/dL — ABNORMAL HIGH (ref 65–99)
Potassium: 4 mmol/L (ref 3.5–5.2)
Sodium: 140 mmol/L (ref 134–144)

## 2015-04-30 NOTE — Progress Notes (Signed)
Internal Medicine Clinic Attending  Case discussed with Dr. Jones at the time of the visit.  We reviewed the resident's history and exam and pertinent patient test results.  I agree with the assessment, diagnosis, and plan of care documented in the resident's note.  

## 2015-05-06 LAB — VMA AND HVA, 24 HR UR
24 HOUR URINE VOLUME (VMAHVA): 1350 mL/(24.h)
CREATININE, URINE MG/DAY-VMAUR: 2.36 g/(24.h) (ref 0.63–2.50)
Homovanillic Acid, 24H Ur: 4.3 mg/24 h (ref 1.6–7.5)
VMA, 24H Ur Adult: 7.3 mg/24 h — ABNORMAL HIGH (ref <=6.0)

## 2015-08-27 ENCOUNTER — Ambulatory Visit: Payer: BLUE CROSS/BLUE SHIELD

## 2015-09-01 ENCOUNTER — Ambulatory Visit (INDEPENDENT_AMBULATORY_CARE_PROVIDER_SITE_OTHER): Payer: BLUE CROSS/BLUE SHIELD | Admitting: Internal Medicine

## 2015-09-01 ENCOUNTER — Encounter: Payer: Self-pay | Admitting: Internal Medicine

## 2015-09-01 ENCOUNTER — Ambulatory Visit: Payer: BLUE CROSS/BLUE SHIELD

## 2015-09-01 VITALS — BP 178/104 | HR 90 | Temp 98.5°F | Ht 73.0 in | Wt 201.8 lb

## 2015-09-01 DIAGNOSIS — I10 Essential (primary) hypertension: Secondary | ICD-10-CM | POA: Diagnosis not present

## 2015-09-01 DIAGNOSIS — R0981 Nasal congestion: Secondary | ICD-10-CM | POA: Diagnosis not present

## 2015-09-01 MED ORDER — LABETALOL HCL 200 MG PO TABS: 400.0000 mg | ORAL_TABLET | Freq: Two times a day (BID) | ORAL | 3 refills | Status: DC

## 2015-09-01 MED ORDER — LORATADINE 10 MG PO TABS: 10.0000 mg | ORAL_TABLET | Freq: Every day | ORAL | 3 refills | Status: DC

## 2015-09-01 MED ORDER — HYDRALAZINE HCL 25 MG PO TABS: 25.0000 mg | ORAL_TABLET | Freq: Three times a day (TID) | ORAL | 3 refills | Status: DC

## 2015-09-01 MED ORDER — AMLODIPINE BESYLATE 10 MG PO TABS: 10.0000 mg | ORAL_TABLET | Freq: Every day | ORAL | 3 refills | Status: DC

## 2015-09-01 NOTE — Assessment & Plan Note (Signed)
BP Readings from Last 3 Encounters:  09/01/15 (!) 178/104  04/28/15 (!) 168/87  04/23/15 (!) 149/82   A: Patient reports taking his medications but only takes Hydralazine BID instead of TID.  He is asymptomatic from his BP which was 178/104 and then 170/105 on recheck. He tells me today his BP was 124/80 at home and usually runs in the 130s systolic.  P: - continue current meds: hydralazine 25mg  TID, Labetalol 400mg  BID, and amlodipine 10mg  daily. Refills given. - possible white coat HTN, have given patient BP log and asked him to check BP daily for the next 2 weeks and bring to clinic - if BP still elevated, could consider adding lisinopril at follow up visit. - RTC 2 weeks.

## 2015-09-01 NOTE — Progress Notes (Signed)
   CC: here for BP follow up  HPI:  Stephen Delgado is a 50 y.o. man with PMHx as below presenting today for HTN follow up.  Please see A&P for status of medical conditions addressed today.  Past Medical History:  Diagnosis Date  . Hypertension     Review of Systems:   Review of Systems  Constitutional: Negative.   Respiratory: Negative.   Cardiovascular: Negative.      Physical Exam:  Vitals:   09/01/15 1414  BP: (!) 178/104  Pulse: 90  Temp: 98.5 F (36.9 C)  TempSrc: Oral  SpO2: 100%  Weight: 201 lb 12.8 oz (91.5 kg)  Height: 6\' 1"  (1.854 m)   Physical Exam  Constitutional: He is well-developed, well-nourished, and in no distress.  HENT:  Head: Normocephalic and atraumatic.  Eyes: EOM are normal.  Pulmonary/Chest: Effort normal.    Assessment & Plan:   See Encounters Tab for problem based charting.   Patient discussed with Dr. Oswaldo Done   Accelerated hypertension BP Readings from Last 3 Encounters:  09/01/15 (!) 178/104  04/28/15 (!) 168/87  04/23/15 (!) 149/82   A: Patient reports taking his medications but only takes Hydralazine BID instead of TID.  He is asymptomatic from his BP which was 178/104 and then 170/105 on recheck. He tells me today his BP was 124/80 at home and usually runs in the 130s systolic.  P: - continue current meds: hydralazine 25mg  TID, Labetalol 400mg  BID, and amlodipine 10mg  daily. Refills given. - possible white coat HTN, have given patient BP log and asked him to check BP daily for the next 2 weeks and bring to clinic - if BP still elevated, could consider adding lisinopril at follow up visit. - RTC 2 weeks.

## 2015-09-01 NOTE — Patient Instructions (Addendum)
Thank you for coming to see me today. It was a pleasure. Today we talked about:   Blood Pressure: - It is elevated today. - I have refilled your amlodipine, labetalol, and hydralazine - please keep a log at home every day for the next 2 weeks and record the readings in a journal.  Bring this to your next appointment. - Please try to limit your salt intake and exercise regularly.  Please follow-up with [place] in 2 weeks with your BP log.  If you have any questions or concerns, please do not hesitate to call the office at [phone].  Take Care,   Gwynn Burly, DO

## 2015-09-03 NOTE — Progress Notes (Signed)
Internal Medicine Clinic Attending  Case discussed with Dr. Wallace at the time of the visit.  We reviewed the resident's history and exam and pertinent patient test results.  I agree with the assessment, diagnosis, and plan of care documented in the resident's note.  

## 2015-09-18 ENCOUNTER — Encounter: Payer: BLUE CROSS/BLUE SHIELD | Admitting: Internal Medicine

## 2016-02-03 ENCOUNTER — Other Ambulatory Visit: Payer: Self-pay

## 2016-02-03 DIAGNOSIS — I10 Essential (primary) hypertension: Secondary | ICD-10-CM

## 2016-02-03 DIAGNOSIS — R0981 Nasal congestion: Secondary | ICD-10-CM

## 2016-02-03 NOTE — Telephone Encounter (Signed)
Requesting all meds to be filled.  

## 2016-02-05 ENCOUNTER — Ambulatory Visit (INDEPENDENT_AMBULATORY_CARE_PROVIDER_SITE_OTHER): Payer: 59 | Admitting: Internal Medicine

## 2016-02-05 ENCOUNTER — Encounter (INDEPENDENT_AMBULATORY_CARE_PROVIDER_SITE_OTHER): Payer: Self-pay

## 2016-02-05 ENCOUNTER — Encounter: Payer: Self-pay | Admitting: Internal Medicine

## 2016-02-05 VITALS — BP 171/113 | HR 91 | Temp 97.7°F | Ht 73.0 in | Wt 208.4 lb

## 2016-02-05 DIAGNOSIS — R7303 Prediabetes: Secondary | ICD-10-CM

## 2016-02-05 DIAGNOSIS — Z79899 Other long term (current) drug therapy: Secondary | ICD-10-CM | POA: Diagnosis not present

## 2016-02-05 DIAGNOSIS — Z0000 Encounter for general adult medical examination without abnormal findings: Secondary | ICD-10-CM | POA: Diagnosis not present

## 2016-02-05 DIAGNOSIS — E1122 Type 2 diabetes mellitus with diabetic chronic kidney disease: Secondary | ICD-10-CM

## 2016-02-05 DIAGNOSIS — E119 Type 2 diabetes mellitus without complications: Secondary | ICD-10-CM

## 2016-02-05 DIAGNOSIS — I10 Essential (primary) hypertension: Secondary | ICD-10-CM | POA: Diagnosis not present

## 2016-02-05 DIAGNOSIS — N182 Chronic kidney disease, stage 2 (mild): Secondary | ICD-10-CM

## 2016-02-05 DIAGNOSIS — Z114 Encounter for screening for human immunodeficiency virus [HIV]: Secondary | ICD-10-CM

## 2016-02-05 DIAGNOSIS — I129 Hypertensive chronic kidney disease with stage 1 through stage 4 chronic kidney disease, or unspecified chronic kidney disease: Secondary | ICD-10-CM

## 2016-02-05 LAB — POCT GLYCOSYLATED HEMOGLOBIN (HGB A1C): HEMOGLOBIN A1C: 6.9

## 2016-02-05 LAB — GLUCOSE, CAPILLARY: GLUCOSE-CAPILLARY: 93 mg/dL (ref 65–99)

## 2016-02-05 MED ORDER — HYDRALAZINE HCL 25 MG PO TABS: 25.0000 mg | ORAL_TABLET | Freq: Three times a day (TID) | ORAL | 5 refills | Status: DC

## 2016-02-05 MED ORDER — AMLODIPINE BESYLATE 10 MG PO TABS: 10.0000 mg | ORAL_TABLET | Freq: Every day | ORAL | 5 refills | Status: DC

## 2016-02-05 MED ORDER — FLUTICASONE PROPIONATE 50 MCG/ACT NA SUSP: 2.0000 | Freq: Every day | NASAL | 5 refills | Status: DC

## 2016-02-05 MED ORDER — LABETALOL HCL 200 MG PO TABS: 400.0000 mg | ORAL_TABLET | Freq: Two times a day (BID) | ORAL | 5 refills | Status: DC

## 2016-02-05 MED ORDER — ASPIRIN EC 81 MG PO TBEC: 81.0000 mg | DELAYED_RELEASE_TABLET | Freq: Every day | ORAL | 5 refills | Status: DC

## 2016-02-05 MED ORDER — LORATADINE 10 MG PO TABS: 10.0000 mg | ORAL_TABLET | Freq: Every day | ORAL | 5 refills | Status: DC

## 2016-02-05 NOTE — Patient Instructions (Signed)
Thank you for coming to see me today. It was a pleasure. Today we talked about:   1. Blood pressure - I have refilled all your medications.  Please come back in 2 weeks to recheck your blood pressure and bring your blood pressure log.  Make sure to get plenty of exercise and reduce salt intake as much as possible.  Please follow-up with us in 2 weeks.  If you have any questions or concerns, please do not hesitate to call the office at [phone].  Take Care,   Gwynn BurlyAndrew Elanora Quin, DO

## 2016-02-05 NOTE — Progress Notes (Signed)
CC: here for BP follow up and health maintenance  HPI:  [name] is a 51 y.o. man with a past medical history listed below here today for follow up of his HTN.   For details of today's visit and the status of his chronic medical issues please refer to the assessment and plan.   Past Medical History:  Diagnosis Date  . Allergy   . Hypertension   . Pre-diabetes     Review of Systems:  Please see pertinent ROS reviewed in HPI and problem based charting.   Physical Exam:  Vitals:   02/05/16 1422  BP: (!) 171/113  Pulse: 91  Temp: 97.7 F (36.5 C)  TempSrc: Oral  SpO2: 100%  Weight: 208 lb 6.4 oz (94.5 kg)  Height: 6\' 1"  (1.854 m)   Physical Exam  Constitutional: He is oriented to person, place, and time.  Pleasant, AA man, sitting in chair, no distress.  HENT:  Head: Normocephalic and atraumatic.  Eyes: Conjunctivae and EOM are normal.  Cardiovascular: Normal rate, regular rhythm and normal heart sounds.   Pulmonary/Chest: Effort normal and breath sounds normal.  Abdominal: Soft. He exhibits no distension. There is no tenderness.  Musculoskeletal: He exhibits no edema.  Neurological: He is alert and oriented to person, place, and time. No cranial nerve deficit.  Skin: Skin is warm and dry.  Psychiatric: Mood and affect normal.     Assessment & Plan:   See Encounters Tab for problem based charting.  Patient discussed with Dr. Heide Spark   Accelerated hypertension BP Readings from Last 3 Encounters:  02/05/16 (!) 171/113  09/01/15 (!) 178/104  04/28/15 (!) 168/87   A:  Patient BP is uncontrolled again today.  He denies symptoms of chest pain, headache, blurry vision, DOE, SOB.  States he takes his BP at home and usually under better control with averages in the 130-140s systolic, but he left his BP recordings at home today and did not bring to visit.  He is on Hydralazine, Labetalol, and Amlodipine.  Previously on Lisinopril and briefly HCTZ, however,  these were discontinued during a hospitalization in March 2017 in setting of AKI.  Cardiology was consulted during this admission who recommended his current regimen.  Of note, he has not taken his medication in 3-4 days per his report.  P: - continue current regimen: Hydralazine 25mg  TID, Labetalol 400mg  BID, and Amlodipine 10mg  daily.  Refills given today. - I have asked him to RTC in 2 weeks for BP recheck but no follow up appointment was not scheduled.  Will message our front desk to schedule ACC visit. - BMET today showed relatively stable creatinine of 1.43.  I think if BP still elevated at follow up it would be reasonable to consider ACE or diuretic again  Chronic kidney disease (CKD) A: Relatively stable CKD 2 in this patient with HTN and now an A1c that meets criteria for diabetes.    P: - BP and DM control will be paramount for long-term control of his CKD - avoid nephrotoxins as much as possible - no evidence of anemia in the 51 yo [place] American male and no other complaints but can consider SPEP in future for completeness in working up his CKD  Diabetes mellitus (HCC) A: A1c 6.9.  No symptoms of polyuria or polydipsia.  P: - RTC in 2 weeks to discuss diagnosis, consider metformin - discuss lifestyle modification, diet and exercise - if not started back on ACE, will need to assess  creatinine/microalbumin - will need eye exam - [facility]  Healthcare maintenance A/P: - HCV negative - HIV negative - declines colonoscopy referral.  Stool cards given.

## 2016-02-06 LAB — BMP8+ANION GAP
Anion Gap: 15 mmol/L (ref 10.0–18.0)
BUN / CREAT RATIO: 9 (ref 9–20)
BUN: 13 mg/dL (ref 6–24)
CO2: 27 mmol/L (ref 18–29)
CREATININE: 1.43 mg/dL — AB (ref 0.76–1.27)
Calcium: 9.8 mg/dL (ref 8.7–10.2)
Chloride: 100 mmol/L (ref 96–106)
GFR calc non Af Amer: 56 mL/min/{1.73_m2} — ABNORMAL LOW (ref >59)
GFR, EST AFRICAN AMERICAN: 65 mL/min/{1.73_m2} (ref >59)
GLUCOSE: 100 mg/dL — AB (ref 65–99)
Potassium: 4 mmol/L (ref 3.5–5.2)
SODIUM: 142 mmol/L (ref 134–144)

## 2016-02-06 LAB — HEPATITIS C ANTIBODY: Hep C Virus Ab: <0.1 s/co ratio (ref 0.0–0.9)

## 2016-02-06 LAB — HIV ANTIBODY (ROUTINE TESTING W REFLEX): HIV Screen 4th Generation wRfx: NONREACTIVE

## 2016-02-06 MED FILL — CLEAR-ATADINE 10 MG TABLET: 10 | 90 days supply | Qty: 90 | Fill #0

## 2016-02-06 MED FILL — hydrALAZINE HCL 25 MG TABS: 25 | 90 days supply | Qty: 270 | Fill #0

## 2016-02-06 MED FILL — LABETALOL HCL 200 MG TABLET: 200 | 90 days supply | Qty: 360 | Fill #0

## 2016-02-06 MED FILL — AMLODIPINE BESYLATE 10 MG T: 10 | 90 days supply | Qty: 90 | Fill #0

## 2016-02-06 MED FILL — ASPIR-LOW EC 81 MG TABLET: 81 | 90 days supply | Qty: 90 | Fill #0

## 2016-02-06 MED FILL — FLUTICASONE PROP 50 MCG SPR: 50 | 90 days supply | Qty: 48 | Fill #0

## 2016-02-08 DIAGNOSIS — Z0000 Encounter for general adult medical examination without abnormal findings: Secondary | ICD-10-CM | POA: Insufficient documentation

## 2016-02-08 DIAGNOSIS — Z515 Encounter for palliative care: Secondary | ICD-10-CM | POA: Insufficient documentation

## 2016-02-08 NOTE — Assessment & Plan Note (Signed)
BP Readings from Last 3 Encounters:  02/05/16 (!) 171/113  09/01/15 (!) 178/104  04/28/15 (!) 168/87   A:  Patient BP is uncontrolled again today.  He denies symptoms of chest pain, headache, blurry vision, DOE, SOB.  States he takes his BP at home and usually under better control with averages in the 130-140s systolic, but he left his BP recordings at home today and did not bring to visit.  He is on Hydralazine, Labetalol, and Amlodipine.  Previously on Lisinopril and briefly HCTZ, however, these were discontinued during a hospitalization in March 2017 in setting of AKI.  Cardiology was consulted during this admission who recommended his current regimen.  Of note, he has not taken his medication in 3-4 days per his report.  P: - continue current regimen: Hydralazine 25mg  TID, Labetalol 400mg  BID, and Amlodipine 10mg  daily.  Refills given today. - I have asked him to RTC in 2 weeks for BP recheck but no follow up appointment was not scheduled.  Will message our front desk to schedule ACC visit. - BMET today showed relatively stable creatinine of 1.43.  I think if BP still elevated at follow up it would be reasonable to consider ACE or diuretic again

## 2016-02-08 NOTE — Assessment & Plan Note (Signed)
A/P: - HCV negative - HIV negative - declines colonoscopy referral.  Stool cards given.

## 2016-02-08 NOTE — Assessment & Plan Note (Signed)
A: Relatively stable CKD 2 in this patient with HTN and now an A1c that meets criteria for diabetes.    P: - BP and DM control will be paramount for long-term control of his CKD - avoid nephrotoxins as much as possible - no evidence of anemia in the 51 yo [place] male and no other complaints but can consider SPEP in future for completeness in working up his CKD

## 2016-02-08 NOTE — Assessment & Plan Note (Signed)
A: A1c 6.9.  No symptoms of polyuria or polydipsia.  P: - RTC in 2 weeks to discuss diagnosis, consider metformin - discuss lifestyle modification, diet and exercise - if not started back on ACE, will need to assess creatinine/microalbumin - will need eye exam - [facility]

## 2016-02-09 NOTE — Progress Notes (Signed)
Internal Medicine Clinic Attending  Case discussed with Dr. Wallace at the time of the visit.  We reviewed the resident's history and exam and pertinent patient test results.  I agree with the assessment, diagnosis, and plan of care documented in the resident's note.  

## 2016-02-25 NOTE — Progress Notes (Signed)
[facility] amlodipine, hydralazine, and labetalol on 02/06/16.  Prior refill was 12/17/15 for 30 day supply on each medication. PCP notified.

## 2016-02-26 ENCOUNTER — Encounter: Payer: Self-pay | Admitting: Internal Medicine

## 2016-02-26 ENCOUNTER — Ambulatory Visit (INDEPENDENT_AMBULATORY_CARE_PROVIDER_SITE_OTHER): Payer: 59 | Admitting: Internal Medicine

## 2016-02-26 VITALS — BP 156/93 | HR 72 | Temp 98.2°F | Ht 72.0 in | Wt 211.4 lb

## 2016-02-26 DIAGNOSIS — E784 Other hyperlipidemia: Secondary | ICD-10-CM | POA: Diagnosis not present

## 2016-02-26 DIAGNOSIS — E785 Hyperlipidemia, unspecified: Secondary | ICD-10-CM

## 2016-02-26 DIAGNOSIS — I129 Hypertensive chronic kidney disease with stage 1 through stage 4 chronic kidney disease, or unspecified chronic kidney disease: Secondary | ICD-10-CM | POA: Diagnosis not present

## 2016-02-26 DIAGNOSIS — E1122 Type 2 diabetes mellitus with diabetic chronic kidney disease: Secondary | ICD-10-CM

## 2016-02-26 DIAGNOSIS — I10 Essential (primary) hypertension: Secondary | ICD-10-CM

## 2016-02-26 DIAGNOSIS — N182 Chronic kidney disease, stage 2 (mild): Secondary | ICD-10-CM

## 2016-02-26 DIAGNOSIS — E119 Type 2 diabetes mellitus without complications: Secondary | ICD-10-CM

## 2016-02-26 DIAGNOSIS — Z0000 Encounter for general adult medical examination without abnormal findings: Secondary | ICD-10-CM

## 2016-02-26 DIAGNOSIS — Z79899 Other long term (current) drug therapy: Secondary | ICD-10-CM

## 2016-02-26 DIAGNOSIS — E1169 Type 2 diabetes mellitus with other specified complication: Secondary | ICD-10-CM | POA: Diagnosis not present

## 2016-02-26 LAB — GLUCOSE, CAPILLARY: Glucose-Capillary: 152 mg/dL — ABNORMAL HIGH (ref 65–99)

## 2016-02-26 LAB — HM DIABETES EYE EXAM

## 2016-02-26 MED ORDER — LISINOPRIL 10 MG PO TABS: 10.0000 mg | ORAL_TABLET | Freq: Every day | ORAL | 1 refills | Status: DC

## 2016-02-26 MED FILL — LISINOPRIL 10 MG TABLET: 10 | 30 days supply | Qty: 30 | Fill #0

## 2016-02-26 NOTE — Progress Notes (Signed)
CC: here for BP f/u and DM diagnosis.  HPI:  StephenStephen Delgado is a 51 y.o. man with a past medical history listed below here today for follow up of his HTN and newly diagnosed DM.   For details of today's visit and the status of his chronic medical issues please refer to the assessment and plan.   Past Medical History:  Diagnosis Date  . Allergy   . Diabetes   . Hypertension     Review of Systems:  Please see pertinent ROS reviewed in HPI and problem based charting.   Physical Exam:  Vitals:   02/26/16 1554  BP: (!) 156/93  Pulse: 72  Temp: 98.2 F (36.8 C)  TempSrc: Oral  SpO2: 100%  Weight: 211 lb 6.4 oz (95.9 kg)  Height: 6' (1.829 m)   Physical Exam  Constitutional: He is oriented to person, place, and time and well-developed, well-nourished, and in no distress.  HENT:  Head: Normocephalic and atraumatic.  Cardiovascular: Normal rate and regular rhythm.   Pulmonary/Chest: Effort normal and breath sounds normal.  Neurological: He is alert and oriented to person, place, and time. Gait normal.  Skin: Skin is warm and dry.     Assessment & Plan:   See Encounters Tab for problem based charting.  Patient discussed with Dr. Heide Spark.  Accelerated hypertension BP Readings from Last 3 Encounters:  02/26/16 (!) 156/93  02/05/16 (!) 171/113  09/01/15 (!) 178/104   A: BP still above goal.  I received a message from our clinic pharmacist that patient did pick up his prescriptions after last visit and he reports taking them as prescribed.  Denies any symptoms of HA, blurry vision, or lightheadedness.  P: - continue current regimen of Hydralazine 25mg  TID, Labetalol 400mg  BID, and amlodipine 10mg  daily - add lisinopril 10mg  daily - RTC in 2 weeks to repeat BP and BMET  Diabetes mellitus (HCC) Lab Results  Component Value Date   HGBA1C 6.9 02/05/2016    A: This is a new diagnosis for the patient.  He seemed to take the news well.  Given his young age and  otherwise being relatively healthy, we discussed a goal A1c for him would be reasonable to be less than 6.5.    P: - refer to DM educator - eye exam done today - urine microalbumin/creatinine done today - foot exam done today - patient prefers to attempt diet and exercise to get to A1c goal.  I feel this is reasonable given his current A1c. - repeat A1c in 3 months.  Consider metformin at that time depending on results.  Chronic kidney disease (CKD) A: Stable CKD 2 in HTN and DM patient.  P: - UA today w/o protein or RBC  Healthcare maintenance A/P: Stool cards given last visit.  Not yet returned.  Hyperlipidemia associated with type 2 diabetes mellitus (HCC) Lipid Panel     Component Value Date/Time   CHOL 191 02/26/2016 1648   TRIG 199 (H) 02/26/2016 1648   HDL 38 (L) 02/26/2016 1648   CHOLHDL 5.0 02/26/2016 1648   CHOLHDL 3.6 04/20/2015 0504   VLDL 14 04/20/2015 0504   LDLCALC 113 (H) 02/26/2016 1648   A: His lipid panel was repeated due to not being on statin therapy and his new diagnosis of diabetes.  Based on his 10-year ASCVD risk being 24%, it is recommended he be on a high-intensity statin. Even if his BP was controlled, his risk would still be over 16%.  He  does not having any DOE, chest pain, or other exertional symptoms.  P: - will prescribe atorvastatin 40mg  daily.  Called patient on 02/28/2016 at 12:29 PM to discuss.  He voiced understanding.   - LFTs were normal in 04/2015

## 2016-02-26 NOTE — Patient Instructions (Signed)
Thank you for coming to see me today. It was a pleasure. Today we talked about:   Blood Pressure: it is getting better and we have added a new medication called Lisinopril.  Please take this every day.  See below for information on low salt diets.  Diabetes: we are going to see how you do with diet and exercise in keeping your diabetes under control.  I have referred you to meet with Lupita Leashonna, our diabetes educator  I will let you know about your lab work..  Please follow-up with us in 2 weeks.  If you have any questions or concerns, please do not hesitate to call the office at [phone].  Take Care,   Gwynn BurlyAndrew Avante Carneiro, DO

## 2016-02-27 LAB — URINALYSIS, ROUTINE W REFLEX MICROSCOPIC
BILIRUBIN UA: NEGATIVE
Ketones, UA: NEGATIVE
Leukocytes, UA: NEGATIVE
Nitrite, UA: NEGATIVE
Protein, UA: NEGATIVE
RBC UA: NEGATIVE
Specific Gravity, UA: 1.02 (ref 1.005–1.030)
UUROB: 0.2 mg/dL (ref 0.2–1.0)
pH, UA: 7 (ref 5.0–7.5)

## 2016-02-27 LAB — MICROALBUMIN / CREATININE URINE RATIO
Creatinine, Urine: 194.3 mg/dL
MICROALBUM., U, RANDOM: 26 ug/mL
Microalb/Creat Ratio: 13.4 mg/g creat (ref 0.0–30.0)

## 2016-02-27 LAB — LIPID PANEL
CHOLESTEROL TOTAL: 191 mg/dL (ref 100–199)
Chol/HDL Ratio: 5 ratio units (ref 0.0–5.0)
HDL: 38 mg/dL — AB (ref >39)
LDL Calculated: 113 mg/dL — ABNORMAL HIGH (ref 0–99)
Triglycerides: 199 mg/dL — ABNORMAL HIGH (ref 0–149)
VLDL CHOLESTEROL CAL: 40 mg/dL (ref 5–40)

## 2016-02-28 DIAGNOSIS — E785 Hyperlipidemia, unspecified: Secondary | ICD-10-CM

## 2016-02-28 DIAGNOSIS — E1169 Type 2 diabetes mellitus with other specified complication: Secondary | ICD-10-CM | POA: Insufficient documentation

## 2016-02-28 MED ORDER — ATORVASTATIN CALCIUM 40 MG PO TABS: 40.0000 mg | ORAL_TABLET | Freq: Every day | ORAL | 11 refills | Status: DC

## 2016-02-28 NOTE — Assessment & Plan Note (Signed)
BP Readings from Last 3 Encounters:  02/26/16 (!) 156/93  02/05/16 (!) 171/113  09/01/15 (!) 178/104   A: BP still above goal.  I received a message from our clinic pharmacist that patient did pick up his prescriptions after last visit and he reports taking them as prescribed.  Denies any symptoms of HA, blurry vision, or lightheadedness.  P: - continue current regimen of Hydralazine 25mg  TID, Labetalol 400mg  BID, and amlodipine 10mg  daily - add lisinopril 10mg  daily - RTC in 2 weeks to repeat BP and BMET

## 2016-02-28 NOTE — Assessment & Plan Note (Signed)
Lipid Panel     Component Value Date/Time   CHOL 191 02/26/2016 1648   TRIG 199 (H) 02/26/2016 1648   HDL 38 (L) 02/26/2016 1648   CHOLHDL 5.0 02/26/2016 1648   CHOLHDL 3.6 04/20/2015 0504   VLDL 14 04/20/2015 0504   LDLCALC 113 (H) 02/26/2016 1648   A: His lipid panel was repeated due to not being on statin therapy and his new diagnosis of diabetes.  Based on his 10-year ASCVD risk being 24%, it is recommended he be on a high-intensity statin. Even if his BP was controlled, his risk would still be over 16%.  He does not having any DOE, chest pain, or other exertional symptoms.  P: - will prescribe atorvastatin 40mg  daily.  Called patient on 02/28/2016 at 12:29 PM to discuss.  He voiced understanding.   - LFTs were normal in 04/2015

## 2016-02-28 NOTE — Assessment & Plan Note (Signed)
A/P: Stool cards given last visit.  Not yet returned.

## 2016-02-28 NOTE — Assessment & Plan Note (Signed)
Lab Results  Component Value Date   HGBA1C 6.9 02/05/2016    A: This is a new diagnosis for the patient.  He seemed to take the news well.  Given his young age and otherwise being relatively healthy, we discussed a goal A1c for him would be reasonable to be less than 6.5.    P: - refer to DM educator - eye exam done today - urine microalbumin/creatinine done today - foot exam done today - patient prefers to attempt diet and exercise to get to A1c goal.  I feel this is reasonable given his current A1c. - repeat A1c in 3 months.  Consider metformin at that time depending on results.

## 2016-02-28 NOTE — Assessment & Plan Note (Signed)
A: Stable CKD 2 in HTN and DM patient.  P: - UA today w/o protein or RBC

## 2016-03-01 MED FILL — ATORVASTATIN 40 MG TABLET: 40 | 30 days supply | Qty: 30 | Fill #0

## 2016-03-01 NOTE — Progress Notes (Signed)
Internal Medicine Clinic Attending  Case discussed with Dr. Wallace at the time of the visit.  We reviewed the resident's history and exam and pertinent patient test results.  I agree with the assessment, diagnosis, and plan of care documented in the resident's note.  

## 2016-03-11 ENCOUNTER — Ambulatory Visit: Payer: 59

## 2016-03-11 ENCOUNTER — Telehealth: Payer: Self-pay | Admitting: Dietician

## 2016-03-11 NOTE — Telephone Encounter (Signed)
Calling to confirm appt for 03/09/16 at 3:00

## 2016-03-12 ENCOUNTER — Encounter: Payer: Self-pay | Admitting: Internal Medicine

## 2016-03-12 ENCOUNTER — Encounter: Payer: Self-pay | Admitting: Dietician

## 2016-03-12 ENCOUNTER — Ambulatory Visit (INDEPENDENT_AMBULATORY_CARE_PROVIDER_SITE_OTHER): Payer: 59 | Admitting: Internal Medicine

## 2016-03-12 ENCOUNTER — Ambulatory Visit (INDEPENDENT_AMBULATORY_CARE_PROVIDER_SITE_OTHER): Payer: 59 | Admitting: Dietician

## 2016-03-12 VITALS — BP 128/74 | HR 94 | Temp 97.8°F | Ht 72.0 in | Wt 210.8 lb

## 2016-03-12 DIAGNOSIS — I10 Essential (primary) hypertension: Secondary | ICD-10-CM | POA: Diagnosis not present

## 2016-03-12 DIAGNOSIS — Z713 Dietary counseling and surveillance: Secondary | ICD-10-CM | POA: Diagnosis not present

## 2016-03-12 DIAGNOSIS — Z79899 Other long term (current) drug therapy: Secondary | ICD-10-CM | POA: Diagnosis not present

## 2016-03-12 DIAGNOSIS — E119 Type 2 diabetes mellitus without complications: Secondary | ICD-10-CM | POA: Diagnosis not present

## 2016-03-12 NOTE — Progress Notes (Signed)
Medicine attending: Medical history, presenting problems, physical findings, and medications, reviewed with resident physician Dr Nina Blum on the day of the patient visit and I concur with her evaluation and management plan. 

## 2016-03-12 NOTE — Assessment & Plan Note (Signed)
Blood pressure well controlled after the addition of lisinopril. No signs of side effects. Will order BMP today to evaluate for possible hyperkalemia. Will also need to check crt given his CKD 2 with b/l crt 1.3.  -follow up BMP  -continue labetalol 200 BID, hydralazine 25 TID,  amlodipine 10 daily and lisinopril 10mg  qd

## 2016-03-12 NOTE — Progress Notes (Addendum)
   CC: follow up of hypertension   HPI: [name] is a 51 y.o. with past medical history as outlined below who presents to clinic for follow up of hypertension.   At last office visit 1/25 he was noted to have high blood pressure 156/93 despite triple medication therapy with labetalol 200 BID, hydralazine 25 TID, and amlodipine 10 daily. Lisinopril 10mg  qd was added he was scheduled for follow up in 2 weeks to reassess BP. He is here today for that recheck of his blood pressure. He denies side effects from the new medication such as cough or changes in taste. He denies shortness of breath, chest pain, or headache.   Please see problem list for status of the pt's chronic medical problems.  Past Medical History:  Diagnosis Date  . Allergy   . Diabetes   . Hypertension     Review of Systems:  Please see each problem below for a pertinent review of systems.  Physical Exam:  Vitals:   03/12/16 1347  BP: 128/74  Pulse: 94  Temp: 97.8 F (36.6 C)  TempSrc: Oral  SpO2: 100%  Weight: 210 lb 12.8 oz (95.6 kg)  Height: 6' (1.829 m)   Physical Exam  Constitutional: He appears well-developed and well-nourished. No distress.  HENT:  Head: Normocephalic and atraumatic.  Cardiovascular: Normal rate and regular rhythm.   No murmur heard. Pulmonary/Chest: Effort normal. No respiratory distress. He has no wheezes. He has no rales.  Abdominal: Soft. Bowel sounds are normal. He exhibits no distension. There is no tenderness.  Skin: He is not diaphoretic.   Assessment & Plan:   See Encounters Tab for problem based charting.  Hypertension  Blood pressure well controlled after the addition of lisinopril. No signs of side effects. Will order BMP today to evaluate for possible hyperkalemia. Will also need to check crt given his CKD 2 with b/l crt 1.3.  -follow up BMP  -continue labetalol 200 BID, hydralazine 25 TID,  amlodipine 10 daily and lisinopril 10mg  qd  Addendum: Crt improved  slightly from 02/2016 and no hyperkalemia, will continue current medications.   Patient discussed with Dr. Cyndie ChimeGranfortuna

## 2016-03-12 NOTE — Progress Notes (Signed)
  Medical Nutrition Therapy:  Appt start time: 1415 end time:  1515 Visit # 1  Assessment:  Primary concerns today: newly diagnosed diabetes.  patient educated per his request on types of diabetes and what causes them, Meal planning for diabetes, self monitoring, exercise and healhty weight recommendations. He is currently trying to cup back on starchy foods and add in vegetables. He does not eat out and does not eat processed or convenience foods.He does add salt to his food and we talked about how to quantify how much he is using and how to limit to ~ 2000mg /day. He has been cutting back on his meat lately and eating more fish. His diet is [place]. He walks 3 days a week for 30 minutes. He is thinking about self monitoring, but said he would let us know at his next visit.   Preferred Learning Style: "likes to read"- No preference indicated  Learning Readiness: Change in progress  ANTHROPOMETRICS: weight-210.8#, height-72", BMI-28  IBW is 184# +/- 20# (184# -204#) waist circumference is 41", encouraged him to decrease it to < 40" WEIGHT HISTORY: Highest: -211#  Lowest-179# SLEEP:need to assess at future visit Dining Out (times/week): never MEDICATIONS: none for diabetes BLOOD SUGAR: A1C 6.9%, per his request blood sugar done today and was 142  after eating a little more than 1 cup oatmeal DIETARY INTAKE: Usual eating pattern includes 2 meals and 0-1 snacks per day. Everyday foods include plantains, fufu, rice, yams, fish 2-3x a week. Marland Kitchen.  Avoided foods include none mentioned today   24-hr recall:  B ( AM): sometimes tea L (12 PM): 1-1.5 cups oatmeal made with water with a little honey, a little milk Snk ( PM): banana on occassion D ( 7 PM): fish, vegetables, starchy foods Beverages:used to drink coffee with a little honey, hot tea 2x/week hot cocoa 3x/week and water   Progress Towards Goal(s):  In progress.   Nutritional Diagnosis:  NB-1.1 Food and nutrition-related knowledge  deficit As related to newly diagnosed diabetes.  As evidenced by his questions and report. .    Intervention:  Nutrition education about diabetes, healthy behavior changes ad support for these changes as indicated above. Limit protein (~ 75 grams per day) and sodium (~ 2000 mg per day) with CKD, encourage healthy fats and increased fats for hyperlipidemia and blood pressure. . Coordination of care: none needed at this time  Teaching Method Utilized: Visual,Auditory,Hands on Handouts given during visit include:wellsmith and link to wellness, Living well with diabetes Barriers to learning/adherence to lifestyle change: Dogs on his walks Demonstrated degree of understanding via:  Teach Back at which is did an excellent job  Monitoring/Evaluation:  Dietary intake, exercise, and body weight in 3 month(s). Plyler, Lupita LeashDonna, RD 03/12/2016 3:45 PM.

## 2016-03-12 NOTE — Patient Instructions (Addendum)
My plan to support myself in continuing these changes to care for my diabetes is to attend or contact:   Exercise  1- Aerobic activity- goal is 150 minutes a week Add two more days so you are walking for 30 minutes 5 days a week 2- Strength building exercises two days a week 3- [place] stretching exercise 2-3 days a week : Diabetes Support Groups  Type 2 diabetes support group : 2nd Monday of every month from 6-7 PM at [address] [facility] conference room [phone]  Journals ? Diabetes Forecast- 1- [phone]- DirectoryTags.siwww.diabetesforecast.org ? Diabetes Self-Management-1- [phone]- www.diabetesselfmanagement.com  Eat more... 1. Whole grains: whole wheat cereal, crackers, bread, pasta, old fashioned oats & brown rice 2. Whole fruits-1 cup a day 3. Vegetables- 2-3 cups a day (plaintains, corn, yams are starchy so limit to 1-1.5 cups a day)  4. Lowfat Protein: Chicken, [place]urkey, lean cuts of beef and pork, fish 2-3 times a week 5. Nuts, Seeds and Soy:add walnuts to cereal, peanut butter sandwich  Eat Less... 1. Saturated & Transfats- mostly from red meat, high fat dairy like milk, ice cream, coffee creamer, snack foods like chips, pork rind, fried foods 2. Added Sugar: artifical sweeteners can help 3. Sodium:Use spices instead of salt, avoid salty foods like soup, crackers, chips, lunch meat, sausage, hot dogs  Avoid.... . High-fructose corn syrup and "other sugars" . Partially hydrogenated vegetable oils . Trans fatty acids . Nondairy coffee creamer Of course, too much of almost any food.   Follow up in 3 months.

## 2016-03-12 NOTE — Patient Instructions (Addendum)
It was a pleasure to meet you today Stephen Delgado,   Your blood pressure is much better today, keep up the good work with taking your medications. Someone from the clinic will let you know if any adjustment in your medications is needed after we get the results of the lab work you are having done today.   Please schedule a follow up appointment in 3 months.

## 2016-03-13 LAB — BMP8+ANION GAP
Anion Gap: 17 mmol/L (ref 10.0–18.0)
BUN/Creatinine Ratio: 10 (ref 9–20)
BUN: 13 mg/dL (ref 6–24)
CALCIUM: 9.3 mg/dL (ref 8.7–10.2)
CHLORIDE: 103 mmol/L (ref 96–106)
CO2: 23 mmol/L (ref 18–29)
CREATININE: 1.33 mg/dL — AB (ref 0.76–1.27)
GFR calc non Af Amer: 61 mL/min/{1.73_m2} (ref >59)
GFR, EST AFRICAN AMERICAN: 71 mL/min/{1.73_m2} (ref >59)
GLUCOSE: 148 mg/dL — AB (ref 65–99)
Potassium: 3.8 mmol/L (ref 3.5–5.2)
Sodium: 143 mmol/L (ref 134–144)

## 2016-03-29 MED FILL — ATORVASTATIN 40 MG TABLET: 40 | 30 days supply | Qty: 30 | Fill #1

## 2016-03-29 MED FILL — LISINOPRIL 10 MG TABLET: 10 | 30 days supply | Qty: 30 | Fill #1

## 2016-06-10 ENCOUNTER — Encounter (INDEPENDENT_AMBULATORY_CARE_PROVIDER_SITE_OTHER): Payer: Self-pay

## 2016-06-10 ENCOUNTER — Ambulatory Visit (INDEPENDENT_AMBULATORY_CARE_PROVIDER_SITE_OTHER): Payer: 59 | Admitting: Internal Medicine

## 2016-06-10 VITALS — BP 187/106 | HR 83 | Temp 98.5°F | Ht 72.0 in | Wt 201.0 lb

## 2016-06-10 DIAGNOSIS — E1169 Type 2 diabetes mellitus with other specified complication: Secondary | ICD-10-CM

## 2016-06-10 DIAGNOSIS — E784 Other hyperlipidemia: Secondary | ICD-10-CM | POA: Diagnosis not present

## 2016-06-10 DIAGNOSIS — E785 Hyperlipidemia, unspecified: Secondary | ICD-10-CM

## 2016-06-10 DIAGNOSIS — I10 Essential (primary) hypertension: Secondary | ICD-10-CM

## 2016-06-10 DIAGNOSIS — E119 Type 2 diabetes mellitus without complications: Secondary | ICD-10-CM

## 2016-06-10 LAB — GLUCOSE, CAPILLARY: Glucose-Capillary: 125 mg/dL — ABNORMAL HIGH (ref 65–99)

## 2016-06-10 LAB — POCT GLYCOSYLATED HEMOGLOBIN (HGB A1C): Hemoglobin A1C: 6.4

## 2016-06-10 MED ORDER — LABETALOL HCL 200 MG PO TABS: 400.0000 mg | ORAL_TABLET | Freq: Two times a day (BID) | ORAL | 5 refills | Status: DC

## 2016-06-10 MED ORDER — LISINOPRIL 10 MG PO TABS: 10.0000 mg | ORAL_TABLET | Freq: Every day | ORAL | 1 refills | Status: DC

## 2016-06-10 MED ORDER — HYDRALAZINE HCL 25 MG PO TABS: 25.0000 mg | ORAL_TABLET | Freq: Three times a day (TID) | ORAL | 5 refills | Status: DC

## 2016-06-10 MED ORDER — AMLODIPINE BESYLATE 10 MG PO TABS: 10.0000 mg | ORAL_TABLET | Freq: Every day | ORAL | 5 refills | Status: DC

## 2016-06-10 MED FILL — LISINOPRIL 10 MG TABLET: 10 | 60 days supply | Qty: 60 | Fill #0

## 2016-06-10 MED FILL — LABETALOL HCL 200 MG TABLET: 200 | 90 days supply | Qty: 360 | Fill #0

## 2016-06-10 MED FILL — AMLODIPINE BESYLATE 10 MG T: 10 | 90 days supply | Qty: 90 | Fill #0

## 2016-06-10 MED FILL — hydrALAZINE HCL 25 MG TABS: 25 | 90 days supply | Qty: 270 | Fill #0

## 2016-06-10 NOTE — Assessment & Plan Note (Addendum)
Patient was diagnosed with diabetes in January 2018. A1c at that time was 6.9. He opted for management with lifestyle modifications. Since that visit, he has been dieting and exercising 30 minutes a day. He has lost 9 pounds since his last visit. I encouraged him to keep up the good work. -- Continue with diet and exercise  -- A1c today  -- F/u PCP 3 months   ADDENDUM: A1c today is 6.4. Called patient with results.

## 2016-06-10 NOTE — Patient Instructions (Signed)
[name],  It was a pleasure to see you today. I have sent refills of all of your medications to your pharmacy. Please continue to take these as previously prescribed. I will call you with the results of your blood work. If you have any questions or concerns, call our clinic at [phone] or after hours call [phone] and ask for the internal medicine resident on call. Thank you!  - Dr. Antony ContrasGuilloud

## 2016-06-10 NOTE — Progress Notes (Signed)
   CC: BP follow up   HPI:  [name] is a 51 y.o. male with past medical history outlined below here for BP follow up. For the details of today's visit, please refer to the assessment and plan.  Past Medical History:  Diagnosis Date  . Allergy   . Diabetes   . Hypertension     Review of Systems:  All pertinents listed in HPI, otherwise negative  Physical Exam:  Vitals:   06/10/16 1406  BP: (!) 187/106  Pulse: 83  Temp: 98.5 F (36.9 C)  TempSrc: Oral  SpO2: 100%  Weight: 201 lb (91.2 kg)  Height: 6' (1.829 m)    Constitutional: NAD, appears comfortable Cardiovascular: RRR, no murmurs, rubs, or gallops.  Pulmonary/Chest: CTAB Abdominal: Soft, non tender, non distended. +BS.  Extremities: Warm and well perfused. No edema.  Neurological: A&Ox3, CN II - XII grossly intact.   Assessment & Plan:   See Encounters Tab for problem based charting.  Patient discussed with Dr. Criselda PeachesMullen

## 2016-06-10 NOTE — Assessment & Plan Note (Addendum)
Patient is here today for medication refill. He ran out of all of his antihypertensive medications 4 days ago. His blood pressure today is 187/106. He is asymptomatic.  -- Refilled labetalol 400 mg twice a day -- Refilled hydralazine 25 mg 3 times a day -- Refilled amlodipine 10 mg daily -- Refilled lisinopril 10 mg daily -- BMP today   ADDENDUM: Renal function stable, creatinine 1.25. Continue above regimen. Patient called and updated with results.

## 2016-06-11 LAB — BMP8+ANION GAP
Anion Gap: 10 mmol/L (ref 10.0–18.0)
BUN / CREAT RATIO: 10 (ref 9–20)
BUN: 12 mg/dL (ref 6–24)
CO2: 28 mmol/L (ref 18–29)
CREATININE: 1.25 mg/dL (ref 0.76–1.27)
Calcium: 9 mg/dL (ref 8.7–10.2)
Chloride: 103 mmol/L (ref 96–106)
GFR calc Af Amer: 76 mL/min/{1.73_m2} (ref >59)
GFR, EST NON AFRICAN AMERICAN: 66 mL/min/{1.73_m2} (ref >59)
Glucose: 122 mg/dL — ABNORMAL HIGH (ref 65–99)
Potassium: 3.9 mmol/L (ref 3.5–5.2)
SODIUM: 141 mmol/L (ref 134–144)

## 2016-06-14 MED ORDER — ATORVASTATIN CALCIUM 40 MG PO TABS: 40.0000 mg | ORAL_TABLET | Freq: Every day | ORAL | 11 refills | Status: DC

## 2016-06-14 MED FILL — ATORVASTATIN 40 MG TABLET: 40 | 30 days supply | Qty: 30 | Fill #0

## 2016-06-14 NOTE — Addendum Note (Signed)
Addended by: Burnell BlanksGUILLOUD, Jahne Krukowski R on: 06/14/2016 11:26 AM   Modules accepted: Orders

## 2016-06-14 NOTE — Assessment & Plan Note (Signed)
Refilled atorvastatin 40 mg daily 

## 2016-06-15 NOTE — Progress Notes (Signed)
Internal Medicine Clinic Attending  Case discussed with Dr. Antony ContrasGuilloud At the time of the visit.  We reviewed the resident's history and exam and pertinent patient test results.  I agree with the assessment, diagnosis, and plan of care documented in the resident's note.

## 2016-07-13 ENCOUNTER — Telehealth: Payer: Self-pay | Admitting: Dietician

## 2016-07-13 NOTE — Telephone Encounter (Signed)
Called patient for his diabetes follow up: last visit was 03/12/2016 Congratulated him on decreasing his weight and his A1C.  Goal set at Diabetes visit: increase activity to 30 minutes at least 5 days a week Assessment of goal: patient reports he is at 100% by walking daily  He checks his feet daily and notes they are fine.  He has no questions or concerns at this time.  Follow up appointment for diabetes self management training recommended no later than 03/2017.  Plyler, Lupita LeashDonna, RD 07/13/2016 4:50 PM.

## 2016-12-27 MED FILL — AMLODIPINE BESYLATE 10 MG T: 10 | 90 days supply | Qty: 90 | Fill #1

## 2017-03-16 ENCOUNTER — Telehealth: Payer: Self-pay | Admitting: Internal Medicine

## 2017-03-16 NOTE — Telephone Encounter (Signed)
Just spoke with patient states his schedule is very busy this week and unable to come.  Was willing to schedule an appointment on April 21, 2017 at 1:45 pm.  An appointment card will be mailed as a reminder.

## 2017-04-21 ENCOUNTER — Encounter: Payer: 59 | Admitting: Internal Medicine

## 2017-05-05 ENCOUNTER — Encounter: Payer: 59 | Admitting: Internal Medicine

## 2017-07-30 ENCOUNTER — Encounter: Payer: Self-pay | Admitting: *Deleted

## 2017-08-10 ENCOUNTER — Other Ambulatory Visit: Payer: Self-pay | Admitting: Internal Medicine

## 2017-08-10 DIAGNOSIS — I10 Essential (primary) hypertension: Secondary | ICD-10-CM

## 2017-08-11 MED FILL — AMLODIPINE BESYLATE 10 MG T: 10 | 60 days supply | Qty: 60 | Fill #0

## 2017-08-11 MED FILL — LABETALOL HCL 200 MG TABS: 200 | 30 days supply | Qty: 120 | Fill #0

## 2017-08-11 NOTE — Telephone Encounter (Signed)
Called pt to schedule an appt w/his PCP. Appt scheduled per front office on 7/18 with Dr Petra KubaVogel.

## 2017-08-18 ENCOUNTER — Other Ambulatory Visit: Payer: Self-pay

## 2017-08-18 ENCOUNTER — Encounter: Payer: Self-pay | Admitting: Internal Medicine

## 2017-08-18 ENCOUNTER — Ambulatory Visit: Payer: 59 | Admitting: Internal Medicine

## 2017-08-18 ENCOUNTER — Encounter (INDEPENDENT_AMBULATORY_CARE_PROVIDER_SITE_OTHER): Payer: Self-pay

## 2017-08-18 VITALS — BP 161/103 | HR 76 | Temp 99.3°F | Ht 72.0 in | Wt 179.3 lb

## 2017-08-18 DIAGNOSIS — Z79899 Other long term (current) drug therapy: Secondary | ICD-10-CM

## 2017-08-18 DIAGNOSIS — E785 Hyperlipidemia, unspecified: Secondary | ICD-10-CM | POA: Diagnosis not present

## 2017-08-18 DIAGNOSIS — Z7982 Long term (current) use of aspirin: Secondary | ICD-10-CM | POA: Diagnosis not present

## 2017-08-18 DIAGNOSIS — E1169 Type 2 diabetes mellitus with other specified complication: Secondary | ICD-10-CM

## 2017-08-18 DIAGNOSIS — I10 Essential (primary) hypertension: Secondary | ICD-10-CM

## 2017-08-18 DIAGNOSIS — Z0000 Encounter for general adult medical examination without abnormal findings: Secondary | ICD-10-CM

## 2017-08-18 DIAGNOSIS — E119 Type 2 diabetes mellitus without complications: Secondary | ICD-10-CM

## 2017-08-18 LAB — POCT GLYCOSYLATED HEMOGLOBIN (HGB A1C): Hemoglobin A1C: 5.7 % — AB (ref 4.0–5.6)

## 2017-08-18 LAB — GLUCOSE, CAPILLARY: Glucose-Capillary: 134 mg/dL — ABNORMAL HIGH (ref 70–99)

## 2017-08-18 MED ORDER — ATORVASTATIN CALCIUM 40 MG PO TABS: 40.0000 mg | ORAL_TABLET | Freq: Every day | ORAL | 11 refills | Status: DC

## 2017-08-18 MED ORDER — HYDRALAZINE HCL 25 MG PO TABS: 25.0000 mg | ORAL_TABLET | Freq: Three times a day (TID) | ORAL | 5 refills | Status: DC

## 2017-08-18 MED ORDER — LISINOPRIL 10 MG PO TABS: 20.0000 mg | ORAL_TABLET | Freq: Every day | ORAL | 11 refills | Status: DC

## 2017-08-18 MED ORDER — LISINOPRIL 10 MG PO TABS: 10.0000 mg | ORAL_TABLET | Freq: Every day | ORAL | 11 refills | Status: DC

## 2017-08-18 MED ORDER — CHLORTHALIDONE 25 MG PO TABS
25.0000 mg | ORAL_TABLET | Freq: Every day | ORAL | 1 refills | Status: DC
Start: 2017-08-18 — End: 2018-05-25

## 2017-08-18 MED FILL — ATORVASTATIN 40 MG TABLET: 40 | 30 days supply | Qty: 30 | Fill #0

## 2017-08-18 MED FILL — CHLORTHALIDONE 25 MG TAB: 25 | 30 days supply | Qty: 30 | Fill #0

## 2017-08-18 MED FILL — hydrALAZINE HCL 25 MG TABS: 25 | 30 days supply | Qty: 90 | Fill #0

## 2017-08-18 MED FILL — LISINOPRIL 10 MG TABLET: 10 | 30 days supply | Qty: 60 | Fill #0

## 2017-08-18 NOTE — Assessment & Plan Note (Addendum)
Chronic.  Prescribed atorvastatin 40 mg but stopped taking it several months ago.  This report taking aspirin 81 mg daily.  No history of ischemic events.  ASCVD ten-year risk of heart attack or stroke is 35%.  Denies chest pain, shortness of breath.  Plan: -Refill atorvastatin 40 mg daily -Discontinue aspirin

## 2017-08-18 NOTE — Assessment & Plan Note (Signed)
Chronic.  Well-controlled.  A1c today 5.7.  Not on any medications.  Continue to monitor.

## 2017-08-18 NOTE — Assessment & Plan Note (Signed)
Discussed colonoscopy.  Patient still unsure.  States he is getting second opinions.

## 2017-08-18 NOTE — Patient Instructions (Addendum)
[name],  It was a pleasure meeting you today.  Thanks so much for coming in.  Your diabetes is doing great!  Lets focus on that blood pressure.  I have sent some prescriptions to the pharmacy here at the hospital.  Please pick them up and continue taking all medications as prescribed.    STOP taking labetalol, hydralazine, aspirin  START taking chlorthalidone 25mg  daily INCREASE lisonopril from 10mg  to 20mg  daily Continue Amlodipine 10mg  daily  Continue atorvastatin 10mg  daily    Follow up: 1 month for BP check

## 2017-08-18 NOTE — Assessment & Plan Note (Signed)
Chronic.  Uncontrolled.  Current regimen includes amlodipine, labetalol, hydralazine, lisinopril.  Patient reports running out of lisinopril a month ago and not taking hydralazine as often as prescribed.  Blood pressure today 183/103.  Denies headaches, vision changes, dizziness, abdominal pain.  Patient does not like taking many medications.  We discussed diet and exercise.  He walks a lot and has recently tried to ride his bike.  He has met with Butch Penny in the past and discussed diet. He has had a work-up in the past for secondary causes of hypertension.  Denies tobacco and alcohol use.  Plan: -Discontinue labetalol. -Discontinue hydralazine -Start chlorthalidone 25 mg daily -Increase lisinopril from 10 mg to 20 mg daily -Continue amlodipine 10 mg daily -Follow-up in 1 month for blood pressure check and repeat BMP

## 2017-08-18 NOTE — Progress Notes (Signed)
   CC: high blood pressure   HPI:  [name] is a 52 y.o. male with uncontrolled high blood pressure, well-controlled diabetes, hyperlipidemia who presents today for medication refills and blood pressure management.  Please see problem based charting for full details of HPI  Past Medical History:  Diagnosis Date  . Allergy   . Diabetes   . Hypertension     Physical Exam:  Vitals:   08/18/17 1533 08/18/17 1621  BP: (!) 183/103 (!) 161/103  Pulse: 88 76  Temp: 99.3 F (37.4 C)   TempSrc: Oral   SpO2: 100%   Weight: 179 lb 4.8 oz (81.3 kg)   Height: 6' (1.829 m)    Gen: Well appearing, NAD Neck: No cervical LAD, No thyromegaly or nodules, No JVD. CV: RRR, no murmurs Pulm: Normal effort, CTA throughout, no wheezing Ext: Warm, no edema, normal joints   Assessment & Plan:   See Encounters Tab for problem based charting.  Patient seen with Dr. Oswaldo DoneVincent

## 2017-08-19 LAB — BMP8+ANION GAP
ANION GAP: 15 mmol/L (ref 10.0–18.0)
BUN/Creatinine Ratio: 10 (ref 9–20)
BUN: 14 mg/dL (ref 6–24)
CALCIUM: 9.7 mg/dL (ref 8.7–10.2)
CHLORIDE: 105 mmol/L (ref 96–106)
CO2: 25 mmol/L (ref 20–29)
Creatinine, Ser: 1.45 mg/dL — ABNORMAL HIGH (ref 0.76–1.27)
GFR calc Af Amer: 63 mL/min/{1.73_m2} (ref >59)
GFR calc non Af Amer: 55 mL/min/{1.73_m2} — ABNORMAL LOW (ref >59)
GLUCOSE: 105 mg/dL — AB (ref 65–99)
POTASSIUM: 3.6 mmol/L (ref 3.5–5.2)
Sodium: 145 mmol/L — ABNORMAL HIGH (ref 134–144)

## 2017-08-19 NOTE — Progress Notes (Signed)
Internal Medicine Clinic Attending  I saw and evaluated the patient.  I personally confirmed the key portions of the history and exam documented by Dr. Petra KubaVogel and I reviewed pertinent patient test results.  The assessment, diagnosis, and plan were formulated together and I agree with the documentation in the resident's note.  Patient with uncontrolled hypertension right now. On a historic regimen of antihypertensives which is difficult for him to manage because of frequent dosing. We are going to simplify and update his regimen to now include chlorthalidone, amlodipine, and lisinopril. I [facility] so we can ensure his HTN becomes better controlled. Renal function a little worse this year, probably due to state of chronic HTN.

## 2017-09-01 ENCOUNTER — Telehealth: Payer: Self-pay | Admitting: Internal Medicine

## 2018-02-09 ENCOUNTER — Other Ambulatory Visit: Payer: Self-pay | Admitting: Internal Medicine

## 2018-02-09 DIAGNOSIS — I10 Essential (primary) hypertension: Secondary | ICD-10-CM

## 2018-02-09 MED FILL — LABETALOL HCL 200 MG TABS: 200 | 30 days supply | Qty: 120 | Fill #1

## 2018-02-09 MED FILL — LISINOPRIL 10 MG TABS: 10 | 30 days supply | Qty: 60 | Fill #1

## 2018-02-09 MED FILL — hydrALAZINE HCL 25 MG TABS: 25 | 30 days supply | Qty: 90 | Fill #1

## 2018-02-09 NOTE — Telephone Encounter (Signed)
Please schedule patient appt in [facility] for blood pressure follow up. I have only authorized a one month refill of his amlodipine. He was supposed to follow up for blood pressure management in september or October last year.

## 2018-02-10 NOTE — Telephone Encounter (Signed)
Please schedule pt f/u appt in [facility] for BP per Dr Petra Kuba. Thanks

## 2018-02-27 ENCOUNTER — Ambulatory Visit: Payer: 59

## 2018-05-25 ENCOUNTER — Ambulatory Visit (INDEPENDENT_AMBULATORY_CARE_PROVIDER_SITE_OTHER): Payer: 59 | Admitting: Internal Medicine

## 2018-05-25 DIAGNOSIS — I10 Essential (primary) hypertension: Secondary | ICD-10-CM | POA: Diagnosis not present

## 2018-05-25 MED ORDER — CHLORTHALIDONE 25 MG PO TABS: 25.0000 mg | ORAL_TABLET | Freq: Every day | ORAL | 3 refills | Status: DC

## 2018-05-25 MED ORDER — AMLODIPINE BESYLATE 10 MG PO TABS: 10.0000 mg | ORAL_TABLET | Freq: Every day | ORAL | 3 refills | Status: DC

## 2018-05-25 NOTE — Progress Notes (Signed)
  [facility] Internal Medicine Residency Telephone Encounter Continuity Care Appointment  HPI:   This telephone encounter was created for [name] on 05/25/2018 for the following purpose/cc hypertension.   Past Medical History:  Past Medical History:  Diagnosis Date  . Allergy   . Diabetes   . Hypertension       ROS:  Denies sob, h/a, blurry vision, chest pain   Assessment / Plan / Recommendations:   Please see A&P under problem oriented charting for assessment of the patient's acute and chronic medical conditions.   As always, pt is advised that if symptoms worsen or new symptoms arise, they should go to an urgent care facility or to to ER for further evaluation.   Consent and Medical Decision Making:   Patient discussed with Dr. Heide Spark  This is a telephone encounter between [name] Leachman and Ali Lowe on 05/25/2018 for hypertension. The visit was conducted with the patient located at work and Ali Lowe at [facility]. The patient's identity was confirmed using their DOB and current address. The patient has consented to being evaluated through a telephone encounter and understands the associated risks (an examination cannot be done and the patient may need to come in for an appointment) / benefits (allows the patient to remain at home, decreasing exposure to coronavirus). I personally spent 7 minutes on medical discussion.

## 2018-05-25 NOTE — Assessment & Plan Note (Addendum)
Improving. He has been compliant with amlodipine 10mg , chlorthalidone 25mg , lisinopril 20mg . However, he ran out of amlodipine 2 weeks ago. He checks is BP at home and since he ran out of amlodipine, he reports SBP around 150. He denies h/a, chest pain, sob, blurry vision. Did not follow up for BMP after starting chlorthalidone, amlodipine, and increasing lisinopril 9 months ago. He needs a BMP asap to assess potassium level. He is at risk for hypokalemia given chlorthalidone and lisinopril.   - BMP, patient unsure which location he prefers. Will order future BMP and ask someone from our clinic to call him Monday and see where he would like to get the lab drawn. Mr. Sigel expressed understanding of this plan.  - refill the above medications

## 2018-05-26 ENCOUNTER — Telehealth: Payer: Self-pay | Admitting: *Deleted

## 2018-05-26 ENCOUNTER — Other Ambulatory Visit: Payer: Self-pay | Admitting: *Deleted

## 2018-05-26 DIAGNOSIS — I10 Essential (primary) hypertension: Secondary | ICD-10-CM

## 2018-05-26 NOTE — Telephone Encounter (Signed)
Called patient per request by Dr. Donne Hazel for instructions on where to get his blood work drawn.  I gave him the address, phone number, hours of operation, and address of the [facility] location at [address]. I also gave him verbal directions and he expressed understanding of the location.  During the conversation he asked me about his medication that Dr. Donne Hazel refilled and wanted to know where to pick them up.  Adline Peals, CMA helped look this information up in his chart and I told the patient that his prescriptions were at [facility] location.  He said that was fine and understood that he could pick them up there.  Maxine Glenn Internal Medicine Lab 2:00pm 05/26/2018

## 2018-05-26 NOTE — Progress Notes (Signed)
Internal Medicine Clinic Attending  Case discussed with Dr. Vogel  at the time of the visit.  We reviewed the resident's history and exam and pertinent patient test results.  I agree with the assessment, diagnosis, and plan of care documented in the resident's note.  

## 2018-05-31 ENCOUNTER — Telehealth: Payer: Self-pay | Admitting: *Deleted

## 2018-05-31 DIAGNOSIS — I10 Essential (primary) hypertension: Secondary | ICD-10-CM | POA: Diagnosis not present

## 2018-05-31 MED FILL — AMLODIPINE BESYLATE 10 MG T: 10 | 90 days supply | Qty: 90 | Fill #0

## 2018-05-31 MED FILL — CHLORTHALIDONE 25 MG TABLET: 25 | 90 days supply | Qty: 90 | Fill #0

## 2018-05-31 NOTE — Telephone Encounter (Signed)
Pt calls and states he cannot afford his meds at [facility], he states he works for [employer] and does not understand why they were sent to [facility] [employer] Have called [facility], spoke to Brenas and will assist pt in transferring to [facility], removed per pt [facility] from profile.

## 2018-06-01 LAB — BMP8+ANION GAP
Anion Gap: 14 mmol/L (ref 10.0–18.0)
BUN/Creatinine Ratio: 17 (ref 9–20)
BUN: 22 mg/dL (ref 6–24)
CO2: 25 mmol/L (ref 20–29)
Calcium: 9.1 mg/dL (ref 8.7–10.2)
Chloride: 106 mmol/L (ref 96–106)
Creatinine, Ser: 1.32 mg/dL — ABNORMAL HIGH (ref 0.76–1.27)
GFR calc Af Amer: 70 mL/min/{1.73_m2} (ref >59)
GFR calc non Af Amer: 61 mL/min/{1.73_m2} (ref >59)
Glucose: 81 mg/dL (ref 65–99)
Potassium: 4 mmol/L (ref 3.5–5.2)
Sodium: 145 mmol/L — ABNORMAL HIGH (ref 134–144)

## 2018-07-22 ENCOUNTER — Encounter: Payer: Self-pay | Admitting: *Deleted

## 2018-09-28 ENCOUNTER — Other Ambulatory Visit: Payer: Self-pay | Admitting: *Deleted

## 2018-09-28 ENCOUNTER — Encounter: Payer: 59 | Admitting: Internal Medicine

## 2018-09-28 ENCOUNTER — Other Ambulatory Visit: Payer: Self-pay | Admitting: Internal Medicine

## 2018-09-28 DIAGNOSIS — I10 Essential (primary) hypertension: Secondary | ICD-10-CM

## 2018-09-28 MED ORDER — LISINOPRIL 10 MG PO TABS: 20.0000 mg | ORAL_TABLET | Freq: Every day | ORAL | 11 refills | Status: DC

## 2018-09-28 MED FILL — AMLODIPINE BESYLATE 10 MG T: 10 | 90 days supply | Qty: 90 | Fill #0

## 2018-09-28 MED FILL — CHLORTHALIDONE 25 MG TABS: 25 | 90 days supply | Qty: 90 | Fill #0

## 2018-09-29 MED FILL — LISINOPRIL 10 MG TABS: 10 | 30 days supply | Qty: 60 | Fill #0

## 2018-10-02 ENCOUNTER — Other Ambulatory Visit: Payer: Self-pay | Admitting: Internal Medicine

## 2018-10-02 DIAGNOSIS — I10 Essential (primary) hypertension: Secondary | ICD-10-CM

## 2020-02-06 ENCOUNTER — Other Ambulatory Visit (HOSPITAL_COMMUNITY): Payer: Self-pay | Admitting: Emergency Medicine

## 2020-02-06 ENCOUNTER — Emergency Department (HOSPITAL_COMMUNITY)
Admission: EM | Admit: 2020-02-06 | Discharge: 2020-02-06 | Disposition: A | Discharge status: Home / Self Care | Payer: Self-pay | Attending: Emergency Medicine | Admitting: Emergency Medicine

## 2020-02-06 ENCOUNTER — Encounter (HOSPITAL_COMMUNITY): Payer: Self-pay

## 2020-02-06 ENCOUNTER — Other Ambulatory Visit: Payer: Self-pay

## 2020-02-06 DIAGNOSIS — I129 Hypertensive chronic kidney disease with stage 1 through stage 4 chronic kidney disease, or unspecified chronic kidney disease: Secondary | ICD-10-CM | POA: Insufficient documentation

## 2020-02-06 DIAGNOSIS — I10 Essential (primary) hypertension: Secondary | ICD-10-CM

## 2020-02-06 DIAGNOSIS — Z79899 Other long term (current) drug therapy: Secondary | ICD-10-CM | POA: Insufficient documentation

## 2020-02-06 DIAGNOSIS — G4700 Insomnia, unspecified: Secondary | ICD-10-CM | POA: Insufficient documentation

## 2020-02-06 DIAGNOSIS — E1122 Type 2 diabetes mellitus with diabetic chronic kidney disease: Secondary | ICD-10-CM | POA: Insufficient documentation

## 2020-02-06 DIAGNOSIS — E876 Hypokalemia: Secondary | ICD-10-CM | POA: Insufficient documentation

## 2020-02-06 DIAGNOSIS — N189 Chronic kidney disease, unspecified: Secondary | ICD-10-CM | POA: Insufficient documentation

## 2020-02-06 LAB — BASIC METABOLIC PANEL
Anion gap: 11 (ref 5–15)
BUN: 18 mg/dL (ref 6–20)
CO2: 27 mmol/L (ref 22–32)
Calcium: 9 mg/dL (ref 8.9–10.3)
Chloride: 101 mmol/L (ref 98–111)
Creatinine, Ser: 1.49 mg/dL — ABNORMAL HIGH (ref 0.61–1.24)
GFR, Estimated: 55 mL/min — ABNORMAL LOW (ref >60)
Glucose, Bld: 115 mg/dL — ABNORMAL HIGH (ref 70–99)
Potassium: 2.9 mmol/L — ABNORMAL LOW (ref 3.5–5.1)
Sodium: 139 mmol/L (ref 135–145)

## 2020-02-06 LAB — CBC WITH DIFFERENTIAL/PLATELET
Abs Immature Granulocytes: 0.01 10*3/uL (ref 0.00–0.07)
Basophils Absolute: 0 10*3/uL (ref 0.0–0.1)
Basophils Relative: 0 %
Eosinophils Absolute: 0 10*3/uL (ref 0.0–0.5)
Eosinophils Relative: 0 %
HCT: 41.6 % (ref 39.0–52.0)
Hemoglobin: 13.6 g/dL (ref 13.0–17.0)
Immature Granulocytes: 0 %
Lymphocytes Relative: 24 %
Lymphs Abs: 1.6 10*3/uL (ref 0.7–4.0)
MCH: 24.3 pg — ABNORMAL LOW (ref 26.0–34.0)
MCHC: 32.7 g/dL (ref 30.0–36.0)
MCV: 74.4 fL — ABNORMAL LOW (ref 80.0–100.0)
Monocytes Absolute: 0.4 10*3/uL (ref 0.1–1.0)
Monocytes Relative: 6 %
Neutro Abs: 4.5 10*3/uL (ref 1.7–7.7)
Neutrophils Relative %: 70 %
Platelets: 239 10*3/uL (ref 150–400)
RBC: 5.59 MIL/uL (ref 4.22–5.81)
RDW: 14.8 % (ref 11.5–15.5)
WBC: 6.5 10*3/uL (ref 4.0–10.5)
nRBC: 0 % (ref 0.0–0.2)

## 2020-02-06 MED ORDER — POTASSIUM CHLORIDE 20 MEQ PO PACK
40.0000 meq | PACK | Freq: Every day | ORAL | Status: DC
  Administered 2020-02-06: 40 meq via ORAL
  Filled 2020-02-06: qty 2

## 2020-02-06 MED ORDER — ZOLPIDEM TARTRATE 10 MG PO TABS: 10.0000 mg | ORAL_TABLET | Freq: Every evening | ORAL | 0 refills | Status: DC | PRN

## 2020-02-06 MED ORDER — METOPROLOL SUCCINATE ER 25 MG PO TB24: 25.0000 mg | ORAL_TABLET | Freq: Every day | ORAL | 0 refills | Status: DC

## 2020-02-06 MED ORDER — ENALAPRIL MALEATE 10 MG PO TABS
20.0000 mg | ORAL_TABLET | Freq: Once | ORAL | Status: AC
  Administered 2020-02-06: 20 mg via ORAL
  Filled 2020-02-06: qty 2

## 2020-02-06 NOTE — Discharge Instructions (Addendum)
Call your primary care doctor or specialist as discussed in the next 2-3 days.   Return immediately back to the ER if:  Your symptoms worsen within the next 12-24 hours. You develop new symptoms such as new fevers, persistent vomiting, new pain, shortness of breath, or new weakness or numbness, or if you have any other concerns.  

## 2020-02-06 NOTE — ED Triage Notes (Signed)
Pt arrived via walk in, c/o insomnia x1 week. States he is just restless at night. BP high in triage, states he has hx of HTN, on meds, compliant.

## 2020-02-06 NOTE — ED Provider Notes (Signed)
[facility]-EMERGENCY DEPT Provider Note   CSN: [id number] Arrival date & time: 02/06/20  1025     History Chief Complaint  Patient presents with  . Insomnia  . Hypertension    Stephen Delgado is a 55 y.o. male.  Patient presents to ER chief complaint of difficulty sleeping, sensation of jitteriness.  Symptoms been ongoing for about a week.  He states he checks his blood pressure and they have been high this past week.  He does take his amlodipine 10 mg daily.  He ran out of his insurance he cannot see his doctor but is insurance is going to be reinstated in 4 days.  Patient otherwise denies any headache or chest pain or abdominal pain.  Denies fevers vomiting cough diarrhea.  He has tried melatonin unknown dose at home and states it has not really been helping his sleep.        Past Medical History:  Diagnosis Date  . Allergy   . Diabetes   . Hypertension     Patient Active Problem List   Diagnosis Date Noted  . Hyperlipidemia associated with type 2 diabetes mellitus (HCC) 02/28/2016  . Healthcare maintenance 02/08/2016  . Sinus congestion 04/28/2015  . Diabetes mellitus (HCC) 04/28/2015  . Accelerated hypertension 04/19/2015  . Abnormal EKG 04/19/2015  . Chronic kidney disease (CKD) 04/19/2015  . Microcytosis 04/19/2015    Past Surgical History:  Procedure Laterality Date  . NO PAST SURGERIES         Family History  Problem Relation Age of Onset  . Stroke Paternal Uncle     Social History   Tobacco Use  . Smoking status: Never Smoker  . Smokeless tobacco: Never Used  Substance Use Topics  . Alcohol use: Yes    Alcohol/week: 0.0 standard drinks    Comment: Rarely.  . Drug use: No    Home Medications Prior to Admission medications   Medication Sig Start Date End Date Taking? Authorizing Provider  metoprolol succinate (TOPROL-XL) 25 MG 24 hr tablet Take 1 tablet (25 mg total) by mouth daily. 02/06/20  Yes Cheryll Cockayne, MD   zolpidem (AMBIEN) 10 MG tablet Take 1 tablet (10 mg total) by mouth at bedtime as needed for up to 5 doses for sleep. 02/06/20  Yes Cheryll Cockayne, MD  amLODipine (NORVASC) 10 MG tablet Take 1 tablet (10 mg total) by mouth daily. 05/25/18   Ali Lowe, MD  atorvastatin (LIPITOR) 40 MG tablet Take 1 tablet (40 mg total) by mouth daily. 08/18/17 08/18/18  Ali Lowe, MD  chlorthalidone (HYGROTON) 25 MG tablet Take 1 tablet (25 mg total) by mouth daily. 05/25/18 07/24/18  Ali Lowe, MD  lisinopril (ZESTRIL) 10 MG tablet Take 2 tablets (20 mg total) by mouth daily. 09/28/18 09/28/19  Dellia Cloud, MD    Allergies    Patient has no known allergies.  Review of Systems   Review of Systems  Constitutional: Negative for fever.  HENT: Negative for ear pain and sore throat.   Eyes: Negative for pain.  Respiratory: Negative for cough.   Cardiovascular: Negative for chest pain.  Gastrointestinal: Negative for abdominal pain.  Genitourinary: Negative for flank pain.  Musculoskeletal: Negative for back pain.  Skin: Negative for color change and rash.  Neurological: Negative for syncope.  All other systems reviewed and are negative.   Physical Exam Updated Vital Signs BP (!) 192/114   Pulse 78   Temp 98 F (36.7 C) (Oral)  Resp 17   Ht 6' (1.829 m)   Wt 89.1 kg   SpO2 97%   BMI 26.64 kg/m   Physical Exam Constitutional:      General: He is not in acute distress.    Appearance: He is well-developed.  HENT:     Head: Normocephalic.     Nose: Nose normal.  Eyes:     Extraocular Movements: Extraocular movements intact.  Cardiovascular:     Rate and Rhythm: Normal rate.  Pulmonary:     Effort: Pulmonary effort is normal.  Skin:    Coloration: Skin is not jaundiced.  Neurological:     General: No focal deficit present.     Mental Status: He is alert and oriented to person, place, and time. Mental status is at baseline.     Cranial Nerves: No cranial nerve deficit.      Motor: No weakness.     Gait: Gait normal.     ED Results / Procedures / Treatments   Labs (all labs ordered are listed, but only abnormal results are displayed) Labs Reviewed  CBC WITH DIFFERENTIAL/PLATELET - Abnormal; Notable for the following components:      Result Value   MCV 74.4 (*)    MCH 24.3 (*)    All other components within normal limits  BASIC METABOLIC PANEL - Abnormal; Notable for the following components:   Potassium 2.9 (*)    Glucose, Bld 115 (*)    Creatinine, Ser 1.49 (*)    GFR, Estimated 55 (*)    All other components within normal limits    EKG None  Radiology No results found.  Procedures Procedures (including critical care time)  Medications Ordered in ED Medications  potassium chloride (KLOR-CON) packet 40 mEq (has no administration in time range)  enalapril (VASOTEC) tablet 20 mg (20 mg Oral Given 02/06/20 1644)    ED Course  I have reviewed the triage vital signs and the nursing notes.  Pertinent labs & imaging results that were available during my care of the patient were reviewed by me and considered in my medical decision making (see chart for details).    MDM Rules/Calculators/A&P                          Patient has no symptoms at this time denies any pain or discomfort.  Blood pressure significantly elevated here.  Given enalapril in addition to his medicines he took at home.  Labs show potassium that was very low and repleted here in the ER.  Patient started on new blood pressure medications metoprolol which advised him to take at night since he takes the amlodipine in the morning.  Advised close follow-up with his primary care doctor within the week to reevaluate his blood pressure.  Advised immediate return to the ER if he has any additional concerns any pains difficulty breathing or any additional questions.   Final Clinical Impression(s) / ED Diagnoses Final diagnoses:  Hypertension, unspecified type  Hypokalemia  Insomnia,  unspecified type    Rx / DC Orders ED Discharge Orders         Ordered    metoprolol succinate (TOPROL-XL) 25 MG 24 hr tablet  Daily        02/06/20 1730    zolpidem (AMBIEN) 10 MG tablet  At bedtime PRN        02/06/20 1731           Cheryll Cockayne, MD 02/06/20  1732  

## 2020-02-06 NOTE — ED Notes (Addendum)
Called for vitals recheck x2 no response.

## 2020-02-06 NOTE — ED Notes (Signed)
An After Visit Summary was printed and given to the patient. Discharge instructions given and no further questions at this time.  Pt denies dizziness, headache.

## 2020-02-06 NOTE — ED Notes (Addendum)
Pt called for vitals recheck and no response.

## 2020-02-07 MED FILL — ZOLPIDEM TARTRATE 10 MG TAB: 10 | 5 days supply | Qty: 5 | Fill #0

## 2020-02-07 MED FILL — METOPROLOL SUCCINATE ER 25: 25 | 30 days supply | Qty: 30 | Fill #0

## 2020-12-30 ENCOUNTER — Inpatient Hospital Stay (HOSPITAL_COMMUNITY)
Admission: EM | Admit: 2020-12-30 | Discharge: 2021-05-22 | DRG: 004 | Disposition: A | Discharge status: Home Health | Payer: 59 | Attending: Internal Medicine | Admitting: Internal Medicine

## 2020-12-30 ENCOUNTER — Other Ambulatory Visit: Payer: Self-pay

## 2020-12-30 ENCOUNTER — Encounter (HOSPITAL_COMMUNITY): Payer: Self-pay | Admitting: Emergency Medicine

## 2020-12-30 ENCOUNTER — Emergency Department (HOSPITAL_COMMUNITY): Payer: 59

## 2020-12-30 DIAGNOSIS — E1169 Type 2 diabetes mellitus with other specified complication: Secondary | ICD-10-CM

## 2020-12-30 DIAGNOSIS — K560 Paralytic ileus: Secondary | ICD-10-CM | POA: Diagnosis present

## 2020-12-30 DIAGNOSIS — R1110 Vomiting, unspecified: Secondary | ICD-10-CM

## 2020-12-30 DIAGNOSIS — J9601 Acute respiratory failure with hypoxia: Secondary | ICD-10-CM | POA: Diagnosis present

## 2020-12-30 DIAGNOSIS — I674 Hypertensive encephalopathy: Secondary | ICD-10-CM | POA: Diagnosis present

## 2020-12-30 DIAGNOSIS — D509 Iron deficiency anemia, unspecified: Secondary | ICD-10-CM | POA: Diagnosis present

## 2020-12-30 DIAGNOSIS — H1132 Conjunctival hemorrhage, left eye: Secondary | ICD-10-CM | POA: Diagnosis not present

## 2020-12-30 DIAGNOSIS — Z4659 Encounter for fitting and adjustment of other gastrointestinal appliance and device: Secondary | ICD-10-CM | POA: Diagnosis not present

## 2020-12-30 DIAGNOSIS — I615 Nontraumatic intracerebral hemorrhage, intraventricular: Secondary | ICD-10-CM | POA: Diagnosis present

## 2020-12-30 DIAGNOSIS — I610 Nontraumatic intracerebral hemorrhage in hemisphere, subcortical: Secondary | ICD-10-CM | POA: Diagnosis not present

## 2020-12-30 DIAGNOSIS — R220 Localized swelling, mass and lump, head: Secondary | ICD-10-CM

## 2020-12-30 DIAGNOSIS — I129 Hypertensive chronic kidney disease with stage 1 through stage 4 chronic kidney disease, or unspecified chronic kidney disease: Secondary | ICD-10-CM | POA: Diagnosis present

## 2020-12-30 DIAGNOSIS — J42 Unspecified chronic bronchitis: Secondary | ICD-10-CM

## 2020-12-30 DIAGNOSIS — D649 Anemia, unspecified: Secondary | ICD-10-CM

## 2020-12-30 DIAGNOSIS — R1310 Dysphagia, unspecified: Secondary | ICD-10-CM | POA: Diagnosis present

## 2020-12-30 DIAGNOSIS — Z20822 Contact with and (suspected) exposure to covid-19: Secondary | ICD-10-CM | POA: Diagnosis present

## 2020-12-30 DIAGNOSIS — J988 Other specified respiratory disorders: Secondary | ICD-10-CM | POA: Diagnosis not present

## 2020-12-30 DIAGNOSIS — H1033 Unspecified acute conjunctivitis, bilateral: Secondary | ICD-10-CM | POA: Diagnosis not present

## 2020-12-30 DIAGNOSIS — Z930 Tracheostomy status: Secondary | ICD-10-CM | POA: Diagnosis not present

## 2020-12-30 DIAGNOSIS — E872 Acidosis, unspecified: Secondary | ICD-10-CM

## 2020-12-30 DIAGNOSIS — R140 Abdominal distension (gaseous): Secondary | ICD-10-CM

## 2020-12-30 DIAGNOSIS — E8770 Fluid overload, unspecified: Secondary | ICD-10-CM | POA: Diagnosis not present

## 2020-12-30 DIAGNOSIS — R066 Hiccough: Secondary | ICD-10-CM | POA: Diagnosis present

## 2020-12-30 DIAGNOSIS — Z789 Other specified health status: Secondary | ICD-10-CM | POA: Diagnosis not present

## 2020-12-30 DIAGNOSIS — Z7982 Long term (current) use of aspirin: Secondary | ICD-10-CM

## 2020-12-30 DIAGNOSIS — E43 Unspecified severe protein-calorie malnutrition: Secondary | ICD-10-CM | POA: Diagnosis present

## 2020-12-30 DIAGNOSIS — I619 Nontraumatic intracerebral hemorrhage, unspecified: Secondary | ICD-10-CM | POA: Diagnosis not present

## 2020-12-30 DIAGNOSIS — R29810 Facial weakness: Secondary | ICD-10-CM | POA: Diagnosis present

## 2020-12-30 DIAGNOSIS — J019 Acute sinusitis, unspecified: Secondary | ICD-10-CM

## 2020-12-30 DIAGNOSIS — K5900 Constipation, unspecified: Secondary | ICD-10-CM

## 2020-12-30 DIAGNOSIS — R0682 Tachypnea, not elsewhere classified: Secondary | ICD-10-CM

## 2020-12-30 DIAGNOSIS — Z9289 Personal history of other medical treatment: Secondary | ICD-10-CM

## 2020-12-30 DIAGNOSIS — R4182 Altered mental status, unspecified: Secondary | ICD-10-CM | POA: Diagnosis not present

## 2020-12-30 DIAGNOSIS — J209 Acute bronchitis, unspecified: Secondary | ICD-10-CM | POA: Diagnosis not present

## 2020-12-30 DIAGNOSIS — A498 Other bacterial infections of unspecified site: Secondary | ICD-10-CM

## 2020-12-30 DIAGNOSIS — Z978 Presence of other specified devices: Secondary | ICD-10-CM

## 2020-12-30 DIAGNOSIS — R197 Diarrhea, unspecified: Secondary | ICD-10-CM

## 2020-12-30 DIAGNOSIS — K56609 Unspecified intestinal obstruction, unspecified as to partial versus complete obstruction: Secondary | ICD-10-CM

## 2020-12-30 DIAGNOSIS — J150 Pneumonia due to Klebsiella pneumoniae: Secondary | ICD-10-CM

## 2020-12-30 DIAGNOSIS — N390 Urinary tract infection, site not specified: Secondary | ICD-10-CM | POA: Diagnosis not present

## 2020-12-30 DIAGNOSIS — Z781 Physical restraint status: Secondary | ICD-10-CM

## 2020-12-30 DIAGNOSIS — R509 Fever, unspecified: Secondary | ICD-10-CM | POA: Diagnosis present

## 2020-12-30 DIAGNOSIS — E1122 Type 2 diabetes mellitus with diabetic chronic kidney disease: Secondary | ICD-10-CM | POA: Diagnosis present

## 2020-12-30 DIAGNOSIS — B309 Viral conjunctivitis, unspecified: Secondary | ICD-10-CM | POA: Diagnosis not present

## 2020-12-30 DIAGNOSIS — I69391 Dysphagia following cerebral infarction: Secondary | ICD-10-CM | POA: Diagnosis not present

## 2020-12-30 DIAGNOSIS — Z1611 Resistance to penicillins: Secondary | ICD-10-CM | POA: Diagnosis present

## 2020-12-30 DIAGNOSIS — E874 Mixed disorder of acid-base balance: Secondary | ICD-10-CM | POA: Diagnosis not present

## 2020-12-30 DIAGNOSIS — G8191 Hemiplegia, unspecified affecting right dominant side: Secondary | ICD-10-CM | POA: Diagnosis present

## 2020-12-30 DIAGNOSIS — I6340 Cerebral infarction due to embolism of unspecified cerebral artery: Secondary | ICD-10-CM | POA: Diagnosis not present

## 2020-12-30 DIAGNOSIS — G928 Other toxic encephalopathy: Secondary | ICD-10-CM | POA: Diagnosis present

## 2020-12-30 DIAGNOSIS — J156 Pneumonia due to other aerobic Gram-negative bacteria: Secondary | ICD-10-CM | POA: Diagnosis not present

## 2020-12-30 DIAGNOSIS — N179 Acute kidney failure, unspecified: Secondary | ICD-10-CM | POA: Diagnosis present

## 2020-12-30 DIAGNOSIS — I611 Nontraumatic intracerebral hemorrhage in hemisphere, cortical: Secondary | ICD-10-CM | POA: Diagnosis not present

## 2020-12-30 DIAGNOSIS — R338 Other retention of urine: Secondary | ICD-10-CM | POA: Diagnosis not present

## 2020-12-30 DIAGNOSIS — Z515 Encounter for palliative care: Secondary | ICD-10-CM | POA: Diagnosis not present

## 2020-12-30 DIAGNOSIS — E870 Hyperosmolality and hypernatremia: Secondary | ICD-10-CM | POA: Diagnosis not present

## 2020-12-30 DIAGNOSIS — I472 Ventricular tachycardia, unspecified: Secondary | ICD-10-CM | POA: Diagnosis not present

## 2020-12-30 DIAGNOSIS — Z66 Do not resuscitate: Secondary | ICD-10-CM | POA: Diagnosis present

## 2020-12-30 DIAGNOSIS — E871 Hypo-osmolality and hyponatremia: Secondary | ICD-10-CM | POA: Diagnosis not present

## 2020-12-30 DIAGNOSIS — R9431 Abnormal electrocardiogram [ECG] [EKG]: Secondary | ICD-10-CM | POA: Diagnosis not present

## 2020-12-30 DIAGNOSIS — L89152 Pressure ulcer of sacral region, stage 2: Secondary | ICD-10-CM | POA: Diagnosis not present

## 2020-12-30 DIAGNOSIS — J690 Pneumonitis due to inhalation of food and vomit: Secondary | ICD-10-CM | POA: Diagnosis present

## 2020-12-30 DIAGNOSIS — E669 Obesity, unspecified: Secondary | ICD-10-CM | POA: Diagnosis present

## 2020-12-30 DIAGNOSIS — G936 Cerebral edema: Secondary | ICD-10-CM | POA: Diagnosis present

## 2020-12-30 DIAGNOSIS — I6320 Cerebral infarction due to unspecified occlusion or stenosis of unspecified precerebral arteries: Secondary | ICD-10-CM | POA: Diagnosis not present

## 2020-12-30 DIAGNOSIS — J15211 Pneumonia due to Methicillin susceptible Staphylococcus aureus: Secondary | ICD-10-CM | POA: Clinically undetermined

## 2020-12-30 DIAGNOSIS — J189 Pneumonia, unspecified organism: Secondary | ICD-10-CM

## 2020-12-30 DIAGNOSIS — G9341 Metabolic encephalopathy: Secondary | ICD-10-CM | POA: Diagnosis not present

## 2020-12-30 DIAGNOSIS — N1832 Chronic kidney disease, stage 3b: Secondary | ICD-10-CM | POA: Diagnosis present

## 2020-12-30 DIAGNOSIS — D508 Other iron deficiency anemias: Secondary | ICD-10-CM | POA: Diagnosis not present

## 2020-12-30 DIAGNOSIS — J1569 Pneumonia due to other gram-negative bacteria: Secondary | ICD-10-CM | POA: Clinically undetermined

## 2020-12-30 DIAGNOSIS — H159 Unspecified disorder of sclera: Secondary | ICD-10-CM

## 2020-12-30 DIAGNOSIS — E860 Dehydration: Secondary | ICD-10-CM | POA: Diagnosis present

## 2020-12-30 DIAGNOSIS — R403 Persistent vegetative state: Secondary | ICD-10-CM | POA: Diagnosis present

## 2020-12-30 DIAGNOSIS — I612 Nontraumatic intracerebral hemorrhage in hemisphere, unspecified: Secondary | ICD-10-CM | POA: Diagnosis not present

## 2020-12-30 DIAGNOSIS — Z430 Encounter for attention to tracheostomy: Secondary | ICD-10-CM

## 2020-12-30 DIAGNOSIS — K567 Ileus, unspecified: Secondary | ICD-10-CM

## 2020-12-30 DIAGNOSIS — E441 Mild protein-calorie malnutrition: Secondary | ICD-10-CM

## 2020-12-30 DIAGNOSIS — I161 Hypertensive emergency: Secondary | ICD-10-CM | POA: Diagnosis present

## 2020-12-30 DIAGNOSIS — I6389 Other cerebral infarction: Secondary | ICD-10-CM | POA: Diagnosis not present

## 2020-12-30 DIAGNOSIS — G935 Compression of brain: Secondary | ICD-10-CM | POA: Diagnosis present

## 2020-12-30 DIAGNOSIS — R7401 Elevation of levels of liver transaminase levels: Secondary | ICD-10-CM

## 2020-12-30 DIAGNOSIS — J9600 Acute respiratory failure, unspecified whether with hypoxia or hypercapnia: Secondary | ICD-10-CM | POA: Diagnosis present

## 2020-12-30 DIAGNOSIS — E781 Pure hyperglyceridemia: Secondary | ICD-10-CM | POA: Diagnosis present

## 2020-12-30 DIAGNOSIS — E1165 Type 2 diabetes mellitus with hyperglycemia: Secondary | ICD-10-CM | POA: Diagnosis present

## 2020-12-30 DIAGNOSIS — I6782 Cerebral ischemia: Secondary | ICD-10-CM

## 2020-12-30 DIAGNOSIS — B9561 Methicillin susceptible Staphylococcus aureus infection as the cause of diseases classified elsewhere: Secondary | ICD-10-CM | POA: Diagnosis present

## 2020-12-30 DIAGNOSIS — L8990 Pressure ulcer of unspecified site, unspecified stage: Secondary | ICD-10-CM | POA: Insufficient documentation

## 2020-12-30 DIAGNOSIS — Z823 Family history of stroke: Secondary | ICD-10-CM

## 2020-12-30 DIAGNOSIS — I639 Cerebral infarction, unspecified: Secondary | ICD-10-CM | POA: Diagnosis not present

## 2020-12-30 DIAGNOSIS — Z6833 Body mass index (BMI) 33.0-33.9, adult: Secondary | ICD-10-CM

## 2020-12-30 DIAGNOSIS — R569 Unspecified convulsions: Secondary | ICD-10-CM | POA: Diagnosis not present

## 2020-12-30 DIAGNOSIS — R069 Unspecified abnormalities of breathing: Secondary | ICD-10-CM

## 2020-12-30 DIAGNOSIS — Z7189 Other specified counseling: Secondary | ICD-10-CM | POA: Diagnosis not present

## 2020-12-30 DIAGNOSIS — H6691 Otitis media, unspecified, right ear: Secondary | ICD-10-CM

## 2020-12-30 DIAGNOSIS — G910 Communicating hydrocephalus: Secondary | ICD-10-CM | POA: Diagnosis present

## 2020-12-30 DIAGNOSIS — H5509 Other forms of nystagmus: Secondary | ICD-10-CM | POA: Diagnosis not present

## 2020-12-30 DIAGNOSIS — K5649 Other impaction of intestine: Secondary | ICD-10-CM | POA: Diagnosis not present

## 2020-12-30 DIAGNOSIS — R5081 Fever presenting with conditions classified elsewhere: Secondary | ICD-10-CM | POA: Diagnosis not present

## 2020-12-30 DIAGNOSIS — J9690 Respiratory failure, unspecified, unspecified whether with hypoxia or hypercapnia: Secondary | ICD-10-CM

## 2020-12-30 DIAGNOSIS — Z79899 Other long term (current) drug therapy: Secondary | ICD-10-CM

## 2020-12-30 DIAGNOSIS — R29708 NIHSS score 8: Secondary | ICD-10-CM | POA: Diagnosis present

## 2020-12-30 DIAGNOSIS — B9689 Other specified bacterial agents as the cause of diseases classified elsewhere: Secondary | ICD-10-CM | POA: Diagnosis not present

## 2020-12-30 DIAGNOSIS — E876 Hypokalemia: Secondary | ICD-10-CM

## 2020-12-30 DIAGNOSIS — I959 Hypotension, unspecified: Secondary | ICD-10-CM | POA: Diagnosis not present

## 2020-12-30 DIAGNOSIS — I10 Essential (primary) hypertension: Secondary | ICD-10-CM | POA: Diagnosis present

## 2020-12-30 DIAGNOSIS — Z91148 Patient's other noncompliance with medication regimen for other reason: Secondary | ICD-10-CM

## 2020-12-30 DIAGNOSIS — M6289 Other specified disorders of muscle: Secondary | ICD-10-CM

## 2020-12-30 HISTORY — DX: Chronic kidney disease, stage 3 unspecified: N18.30

## 2020-12-30 HISTORY — DX: Other ill-defined heart diseases: I51.89

## 2020-12-30 HISTORY — DX: Cerebral infarction, unspecified: I63.9

## 2020-12-30 HISTORY — DX: Type 2 diabetes mellitus without complications: E11.9

## 2020-12-30 HISTORY — DX: Nontraumatic intracerebral hemorrhage, intraventricular: I61.5

## 2020-12-30 HISTORY — DX: Acute respiratory failure, unspecified whether with hypoxia or hypercapnia: J96.00

## 2020-12-30 LAB — HEPATIC FUNCTION PANEL
ALT: 26 U/L (ref 0–44)
ALT: 25 U/L (ref 0–44)
AST: 26 U/L (ref 15–41)
AST: 34 U/L (ref 15–41)
Albumin: 4.2 g/dL (ref 3.5–5.0)
Albumin: 4.2 g/dL (ref 3.5–5.0)
Alkaline Phosphatase: 60 U/L (ref 38–126)
Alkaline Phosphatase: 60 U/L (ref 38–126)
Bilirubin, Direct: 0.2 mg/dL (ref 0.0–0.2)
Bilirubin, Direct: 0.1 mg/dL (ref 0.0–0.2)
Indirect Bilirubin: 0.6 mg/dL (ref 0.3–0.9)
Indirect Bilirubin: 0.6 mg/dL (ref 0.3–0.9)
Total Bilirubin: 0.8 mg/dL (ref 0.3–1.2)
Total Bilirubin: 0.7 mg/dL (ref 0.3–1.2)
Total Protein: 8.2 g/dL — ABNORMAL HIGH (ref 6.5–8.1)
Total Protein: 8.3 g/dL — ABNORMAL HIGH (ref 6.5–8.1)

## 2020-12-30 LAB — CBC WITH DIFFERENTIAL/PLATELET
Abs Immature Granulocytes: 0.09 10*3/uL — ABNORMAL HIGH (ref 0.00–0.07)
Basophils Absolute: 0 10*3/uL (ref 0.0–0.1)
Basophils Relative: 0 %
Eosinophils Absolute: 0 10*3/uL (ref 0.0–0.5)
Eosinophils Relative: 0 %
HCT: 45.6 % (ref 39.0–52.0)
Hemoglobin: 14.5 g/dL (ref 13.0–17.0)
Immature Granulocytes: 1 %
Lymphocytes Relative: 7 %
Lymphs Abs: 1.1 10*3/uL (ref 0.7–4.0)
MCH: 23.7 pg — ABNORMAL LOW (ref 26.0–34.0)
MCHC: 31.8 g/dL (ref 30.0–36.0)
MCV: 74.4 fL — ABNORMAL LOW (ref 80.0–100.0)
Monocytes Absolute: 0.6 10*3/uL (ref 0.1–1.0)
Monocytes Relative: 4 %
Neutro Abs: 13.3 10*3/uL — ABNORMAL HIGH (ref 1.7–7.7)
Neutrophils Relative %: 88 %
Platelets: 243 10*3/uL (ref 150–400)
RBC: 6.13 MIL/uL — ABNORMAL HIGH (ref 4.22–5.81)
RDW: 14.8 % (ref 11.5–15.5)
WBC: 15.1 10*3/uL — ABNORMAL HIGH (ref 4.0–10.5)
nRBC: 0 % (ref 0.0–0.2)

## 2020-12-30 LAB — CBG MONITORING, ED
Glucose-Capillary: 136 mg/dL — ABNORMAL HIGH (ref 70–99)
Glucose-Capillary: 136 mg/dL — ABNORMAL HIGH (ref 70–99)

## 2020-12-30 LAB — BASIC METABOLIC PANEL
Anion gap: 12 (ref 5–15)
BUN: 24 mg/dL — ABNORMAL HIGH (ref 6–20)
CO2: 28 mmol/L (ref 22–32)
Calcium: 9.5 mg/dL (ref 8.9–10.3)
Chloride: 99 mmol/L (ref 98–111)
Creatinine, Ser: 1.71 mg/dL — ABNORMAL HIGH (ref 0.61–1.24)
GFR, Estimated: 46 mL/min — ABNORMAL LOW (ref >60)
Glucose, Bld: 149 mg/dL — ABNORMAL HIGH (ref 70–99)
Potassium: 3.2 mmol/L — ABNORMAL LOW (ref 3.5–5.1)
Sodium: 139 mmol/L (ref 135–145)

## 2020-12-30 LAB — TROPONIN I (HIGH SENSITIVITY)
Troponin I (High Sensitivity): 45 ng/L — ABNORMAL HIGH (ref <18)
Troponin I (High Sensitivity): 63 ng/L — ABNORMAL HIGH (ref <18)

## 2020-12-30 MED ORDER — LABETALOL HCL 5 MG/ML IV SOLN
0.5000 mg/min | Status: DC
  Filled 2020-12-30: qty 80

## 2020-12-30 MED ORDER — PANTOPRAZOLE SODIUM 40 MG IV SOLR
40.0000 mg | Freq: Every day | INTRAVENOUS | Status: DC
  Administered 2020-12-30: 40 mg via INTRAVENOUS

## 2020-12-30 MED ORDER — PANTOPRAZOLE SODIUM 40 MG IV SOLR
INTRAVENOUS | Status: AC
  Administered 2020-12-30: 40 mg
  Filled 2020-12-30: qty 40

## 2020-12-30 MED ORDER — ACETAMINOPHEN 325 MG PO TABS
650.0000 mg | ORAL_TABLET | ORAL | Status: DC | PRN
  Administered 2021-01-20 – 2021-02-17 (×2): 650 mg via ORAL
  Filled 2020-12-30 (×2): qty 2

## 2020-12-30 MED ORDER — LABETALOL HCL 5 MG/ML IV SOLN
10.0000 mg | Freq: Once | INTRAVENOUS | Status: AC
  Administered 2020-12-30: 10 mg via INTRAVENOUS

## 2020-12-30 MED ORDER — CLEVIDIPINE BUTYRATE 0.5 MG/ML IV EMUL
0.0000 mg/h | INTRAVENOUS | Status: DC
  Administered 2020-12-30: 16 mg/h via INTRAVENOUS
  Administered 2020-12-31: 32 mg/h via INTRAVENOUS
  Administered 2020-12-31: 30 mg/h via INTRAVENOUS
  Administered 2020-12-31 (×5): 32 mg/h via INTRAVENOUS
  Administered 2020-12-31: 21 mg/h via INTRAVENOUS
  Administered 2020-12-31: 32 mg/h via INTRAVENOUS
  Administered 2020-12-31: 20 mg/h via INTRAVENOUS
  Administered 2020-12-31 (×4): 32 mg/h via INTRAVENOUS
  Administered 2021-01-01: 24 mg/h via INTRAVENOUS
  Administered 2021-01-01 (×3): 32 mg/h via INTRAVENOUS
  Filled 2020-12-30: qty 100
  Filled 2020-12-30: qty 50
  Filled 2020-12-30: qty 100
  Filled 2020-12-30: qty 200
  Filled 2020-12-30 (×2): qty 100
  Filled 2020-12-30: qty 50
  Filled 2020-12-30 (×2): qty 100
  Filled 2020-12-30: qty 50
  Filled 2020-12-30: qty 100
  Filled 2020-12-30 (×2): qty 200
  Filled 2020-12-30 (×2): qty 100

## 2020-12-30 MED ORDER — ACETAMINOPHEN 160 MG/5ML PO SOLN
650.0000 mg | ORAL | Status: DC | PRN
  Administered 2020-12-31 – 2021-01-06 (×14): 650 mg
  Administered 2021-01-07 (×2): 649.6 mg
  Administered 2021-01-07 – 2021-05-15 (×61): 650 mg
  Filled 2020-12-30 (×77): qty 20.3

## 2020-12-30 MED ORDER — CLEVIDIPINE BUTYRATE 0.5 MG/ML IV EMUL
0.0000 mg/h | INTRAVENOUS | Status: DC
  Administered 2020-12-30: 1 mg/h via INTRAVENOUS

## 2020-12-30 MED ORDER — SENNOSIDES-DOCUSATE SODIUM 8.6-50 MG PO TABS
1.0000 | ORAL_TABLET | Freq: Two times a day (BID) | ORAL | Status: DC
  Filled 2020-12-30: qty 1

## 2020-12-30 MED ORDER — STROKE: EARLY STAGES OF RECOVERY BOOK
Freq: Once | Status: AC
  Filled 2020-12-30: qty 1

## 2020-12-30 MED ORDER — ACETAMINOPHEN 650 MG RE SUPP
650.0000 mg | RECTAL | Status: DC | PRN
  Administered 2021-01-03 – 2021-05-12 (×6): 650 mg via RECTAL
  Filled 2020-12-30 (×5): qty 1

## 2020-12-30 NOTE — ED Provider Notes (Signed)
Emergency Medicine Provider Triage Evaluation Note  Stephen Delgado , a 55 y.o. male  was evaluated in triage.  Pt complains of altered mental status.  History divided by the patient's son who is at bedside, level 5 caveat applies.  Patient returned from work yesterday and was feeling tired, he ate dinner and had an episode of emesis and went to bed.  He complained of a sudden onset severe headache but went to bed.  woke up around 9 AM and was confused.  Not responding and his son asked questions, unable to get in and out of the vehicle without assistance.  Nothing like this happened in the past, diffusely weak throughout..  Review of Systems  Positive: Confusion, weakness, vomiting, headache, high blood pressure Negative: Chest pain  Physical Exam  BP (!) 238/160 (BP Location: Left Arm)   Pulse (!) 117   Temp 98.9 F (37.2 C) (Oral)   Resp 16   SpO2 95%  Gen:   Patient is not responding to questions or commands, does respond to simple stimuli and follows this provider with his eyes. Resp:  Normal effort  MSK:   Moves extremities without difficulty  Other:  Tachycardic, patient is unable to follow commands.  Medical Decision Making  Medically screening exam initiated at 4:58 PM.  Appropriate orders placed.  Stephen Delgado was informed that the remainder of the evaluation will be completed by another provider, this initial triage assessment does not replace that evaluation, and the importance of remaining in the ED until their evaluation is complete.  Needs room, not a code stroke but is very altered.   Theron Arista, PA-C 12/30/20 1700    Wynetta Fines, MD 01/04/21 [phone]

## 2020-12-30 NOTE — ED Provider Notes (Addendum)
[facility] DEPT Provider Note   CSN: [id number] Arrival date & time: 12/30/20  1637     History Chief Complaint  Patient presents with   Altered Mental Status    Stephen Delgado is a 55 y.o. male.  HPI  55 year old male with past medical history of HTN, DM presents emergency department with high blood pressure and altered mental status.  Son is the primary history giver, patient is drowsy on exam, opens eyes to name but does not participate in interview.  Level 5 caveat.  Report from son is that the patient is noncompliant with his hypertensive medications.  Last night he got home around 9 PM, ate dinner.  Ended up having an episode of vomiting, felt tired and went to bed around 10 PM.  All day today they state the patient's been tired, altered, not motivated to move or participate in his usual activities.  Patient moans to name, intermittently follows commands.  Not on any anticoagulation.  Past Medical History:  Diagnosis Date   Allergy    Diabetes    Hypertension     Patient Active Problem List   Diagnosis Date Noted   Hyperlipidemia associated with type 2 diabetes mellitus ([place]) 02/28/2016   Healthcare maintenance 02/08/2016   Sinus congestion 04/28/2015   Diabetes mellitus ([place]) 04/28/2015   Accelerated hypertension 04/19/2015   Abnormal EKG 04/19/2015   Chronic kidney disease (CKD) 04/19/2015   Microcytosis 04/19/2015    Past Surgical History:  Procedure Laterality Date   NO PAST SURGERIES         Family History  Problem Relation Age of Onset   Stroke Paternal Uncle     Social History   Tobacco Use   Smoking status: Never   Smokeless tobacco: Never  Substance Use Topics   Alcohol use: Yes    Alcohol/week: 0.0 standard drinks    Comment: Rarely.   Drug use: No    Home Medications Prior to Admission medications   Medication Sig Start Date End Date Taking? Authorizing Provider  amLODipine (NORVASC) 10 MG tablet Take 1  tablet (10 mg total) by mouth daily. 05/25/18  Yes Isabelle Course, MD  aspirin 81 MG EC tablet Take 81 mg by mouth daily. Swallow whole.   Yes [provider]  metoprolol succinate (TOPROL-XL) 25 MG 24 hr tablet TAKE 1 TABLET BY MOUTH ONCE A DAY 02/06/20 02/05/21 Yes Hong, Greggory Brandy, MD  zolpidem (AMBIEN) 10 MG tablet TAKE 1 TABLET BY MOUTH NIGHTLY AT BEDTIME AS NEEDED FOR SLEEP Patient not taking: Reported on 12/30/2020 02/06/20 08/04/20  Luna Fuse, MD    Allergies    Patient has no known allergies.  Review of Systems   Review of Systems  Unable to perform ROS: Mental status change   Physical Exam Updated Vital Signs BP (!) 185/98   Pulse (!) 107   Temp 98.9 F (37.2 C) (Oral)   Resp 20   SpO2 93%   Physical Exam Vitals and nursing note reviewed.  Constitutional:      Appearance: He is not diaphoretic.     Comments: Drowsy, opens eyes/moans to name  HENT:     Head: Normocephalic.     Mouth/Throat:     Mouth: Mucous membranes are moist.  Eyes:     Pupils: Pupils are equal, round, and reactive to light.  Cardiovascular:     Rate and Rhythm: Tachycardia present.  Pulmonary:     Effort: Pulmonary effort is normal. No  respiratory distress.  Abdominal:     Palpations: Abdomen is soft.  Musculoskeletal:        General: No deformity.     Cervical back: No rigidity.  Skin:    General: Skin is warm.  Neurological:     Mental Status: He is disoriented.     Comments: Withdrawals 4/4 extremities, lethargic, responds to painful stimuli    ED Results / Procedures / Treatments   Labs (all labs ordered are listed, but only abnormal results are displayed) Labs Reviewed  BASIC METABOLIC PANEL - Abnormal; Notable for the following components:      Result Value   Potassium 3.2 (*)    Glucose, Bld 149 (*)    BUN 24 (*)    Creatinine, Ser 1.71 (*)    GFR, Estimated 46 (*)    All other components within normal limits  CBC WITH DIFFERENTIAL/PLATELET - Abnormal; Notable for  the following components:   WBC 15.1 (*)    RBC 6.13 (*)    MCV 74.4 (*)    MCH 23.7 (*)    Neutro Abs 13.3 (*)    Abs Immature Granulocytes 0.09 (*)    All other components within normal limits  CBG MONITORING, ED - Abnormal; Notable for the following components:   Glucose-Capillary 136 (*)    All other components within normal limits  CBG MONITORING, ED - Abnormal; Notable for the following components:   Glucose-Capillary 136 (*)    All other components within normal limits  CBC WITH DIFFERENTIAL/PLATELET  URINALYSIS, ROUTINE W REFLEX MICROSCOPIC  HEPATIC FUNCTION PANEL  TROPONIN I (HIGH SENSITIVITY)  TROPONIN I (HIGH SENSITIVITY)    EKG None  Radiology CT Head Wo Contrast  Result Date: 12/30/2020 CLINICAL DATA:  Headache.  Concern for intracranial hemorrhage. EXAM: CT HEAD WITHOUT CONTRAST TECHNIQUE: Contiguous axial images were obtained from the base of the skull through the vertex without intravenous contrast. COMPARISON:  None. FINDINGS: Brain: There is a large amount of intraventricular hemorrhage involving the lateral, third, and fourth ventricle. There is intraparenchymal hemorrhage within the left basal ganglia. No significant hydrocephalus. There is moderate periventricular and deep white matter chronic microvascular ischemic changes. No significant mass effect or midline shift. Vascular: No hyperdense vessel or unexpected calcification. Skull: Normal. Negative for fracture or focal lesion. Sinuses/Orbits: Partially visualized 2 cm left maxillary sinus retention cyst or polyp. The remainder of the visualized paranasal sinuses and mastoid air cells are clear. Other: None IMPRESSION: 1. Left basal ganglia intraparenchymal hemorrhage with extension of a large amount of blood into the ventricular system. No significant mass effect or midline shift. 2. Moderate chronic microvascular ischemic changes. These results were called by telephone at the time of interpretation on 12/30/2020  at 5:45 pm to Dr. Deanna Artis , who verbally acknowledged these results. Electronically Signed   By: Elgie Collard M.D.   On: 12/30/2020 17:49    Procedures .Critical Care Performed by: Rozelle Logan, DO Authorized by: Rozelle Logan, DO   Critical care provider statement:    Critical care time (minutes):  60   Critical care time was exclusive of:  Separately billable procedures and treating other patients   Critical care was necessary to treat or prevent imminent or life-threatening deterioration of the following conditions:  CNS failure or compromise   Critical care was time spent personally by me on the following activities:  Development of treatment plan with patient or surrogate, discussions with consultants, evaluation of patient's response to treatment, examination of  patient, ordering and review of laboratory studies, ordering and review of radiographic studies, ordering and performing treatments and interventions, pulse oximetry, re-evaluation of patient's condition and review of old charts   I assumed direction of critical care for this patient from another provider in my specialty: no     Care discussed with: admitting provider     Medications Ordered in ED Medications  clevidipine (CLEVIPREX) infusion 0.5 mg/mL (16 mg/hr Intravenous Rate/Dose Change 12/30/20 1916)  labetalol (NORMODYNE) injection 10 mg (10 mg Intravenous Given 12/30/20 1746)    ED Course  I have reviewed the triage vital signs and the nursing notes.  Pertinent labs & imaging results that were available during my care of the patient were reviewed by me and considered in my medical decision making (see chart for details).    MDM Rules/Calculators/A&P                           55 year old male presents emergency department with hypertension, vomiting and altered mental status.  Hypertensive on arrival with systolics around [id number].  Patient is drowsy, altered, opens eyes and moans to name and painful  stimuli.  Withdraws 4/4 extremities.  Level 5 caveat due to acuity/altered mental status.  Head CT confirms basal ganglia bleed with intraventricular extension.  Consulted neurosurgery Dr. Marcello Moores who says no acute intervention from their standpoint.  Patient is transition to Cleviprex with goal systolics 1 [id number].  Spoke with on-call neurologist Dr. Cheral Marker.  Plan to admit to neuro ICU, agree with strict SBP control.  No anticoagulation to reverse.  No recommended antiepileptics at this time.  We are awaiting bed placement into an ICU bed and transfer to [facility].  Patients evaluation and results requires admission for further treatment and care. Family has been updated and agrees with admission plan.  Neuro exam unchanged at time of admission, SBP 160 and still trending Cleviprex.  Per son patient is full code at this time.  Final Clinical Impression(s) / ED Diagnoses Final diagnoses:  Nontraumatic intracerebral hemorrhage, unspecified cerebral location, unspecified laterality [facility])    Rx / DC Orders ED Discharge Orders     None        Lorelle Gibbs, DO 12/30/20 1928    Lorelle Gibbs, DO 12/30/20 1928

## 2020-12-30 NOTE — ED Triage Notes (Signed)
History given by patient's son, who reports pt has been altered, lethargic and several episodes of vomiting x1 day. Reports hx of HTN and sleep apnea. Patient extremely lethargic and unable to answer questions at this time.

## 2020-12-30 NOTE — ED Notes (Addendum)
Call placed for call back to [facility] RN from [place] MD from [phone]

## 2020-12-31 ENCOUNTER — Inpatient Hospital Stay (HOSPITAL_COMMUNITY): Payer: 59

## 2020-12-31 DIAGNOSIS — G936 Cerebral edema: Secondary | ICD-10-CM

## 2020-12-31 DIAGNOSIS — I6389 Other cerebral infarction: Secondary | ICD-10-CM

## 2020-12-31 DIAGNOSIS — I615 Nontraumatic intracerebral hemorrhage, intraventricular: Secondary | ICD-10-CM | POA: Diagnosis not present

## 2020-12-31 DIAGNOSIS — Z66 Do not resuscitate: Secondary | ICD-10-CM | POA: Diagnosis not present

## 2020-12-31 DIAGNOSIS — I610 Nontraumatic intracerebral hemorrhage in hemisphere, subcortical: Secondary | ICD-10-CM | POA: Diagnosis not present

## 2020-12-31 DIAGNOSIS — E7800 Pure hypercholesterolemia, unspecified: Secondary | ICD-10-CM

## 2020-12-31 DIAGNOSIS — Z515 Encounter for palliative care: Secondary | ICD-10-CM | POA: Diagnosis not present

## 2020-12-31 DIAGNOSIS — Z20822 Contact with and (suspected) exposure to covid-19: Secondary | ICD-10-CM | POA: Diagnosis not present

## 2020-12-31 DIAGNOSIS — I161 Hypertensive emergency: Secondary | ICD-10-CM | POA: Diagnosis not present

## 2020-12-31 LAB — GLUCOSE, CAPILLARY
Glucose-Capillary: 192 mg/dL — ABNORMAL HIGH (ref 70–99)
Glucose-Capillary: 198 mg/dL — ABNORMAL HIGH (ref 70–99)
Glucose-Capillary: 181 mg/dL — ABNORMAL HIGH (ref 70–99)

## 2020-12-31 LAB — BASIC METABOLIC PANEL
Anion gap: 14 (ref 5–15)
BUN: 23 mg/dL — ABNORMAL HIGH (ref 6–20)
CO2: 24 mmol/L (ref 22–32)
Calcium: 9.3 mg/dL (ref 8.9–10.3)
Chloride: 99 mmol/L (ref 98–111)
Creatinine, Ser: 1.83 mg/dL — ABNORMAL HIGH (ref 0.61–1.24)
GFR, Estimated: 43 mL/min — ABNORMAL LOW (ref >60)
Glucose, Bld: 184 mg/dL — ABNORMAL HIGH (ref 70–99)
Potassium: 3.2 mmol/L — ABNORMAL LOW (ref 3.5–5.1)
Sodium: 137 mmol/L (ref 135–145)

## 2020-12-31 LAB — PROTIME-INR
INR: 1.1 (ref 0.8–1.2)
Prothrombin Time: 14 seconds (ref 11.4–15.2)

## 2020-12-31 LAB — LIPID PANEL
Cholesterol: 175 mg/dL (ref 0–200)
HDL: 45 mg/dL (ref >40)
LDL Cholesterol: UNDETERMINED mg/dL (ref 0–99)
Total CHOL/HDL Ratio: 3.9 RATIO
Triglycerides: 481 mg/dL — ABNORMAL HIGH (ref <150)
VLDL: UNDETERMINED mg/dL (ref 0–40)

## 2020-12-31 LAB — URINALYSIS, MICROSCOPIC (REFLEX): Bacteria, UA: NONE SEEN

## 2020-12-31 LAB — CBC
HCT: 43.4 % (ref 39.0–52.0)
Hemoglobin: 14.7 g/dL (ref 13.0–17.0)
MCH: 25 pg — ABNORMAL LOW (ref 26.0–34.0)
MCHC: 33.9 g/dL (ref 30.0–36.0)
MCV: 73.9 fL — ABNORMAL LOW (ref 80.0–100.0)
Platelets: 291 10*3/uL (ref 150–400)
RBC: 5.87 MIL/uL — ABNORMAL HIGH (ref 4.22–5.81)
RDW: 14.4 % (ref 11.5–15.5)
WBC: 20 10*3/uL — ABNORMAL HIGH (ref 4.0–10.5)
nRBC: 0 % (ref 0.0–0.2)

## 2020-12-31 LAB — RAPID URINE DRUG SCREEN, HOSP PERFORMED
Amphetamines: NOT DETECTED
Barbiturates: NOT DETECTED
Benzodiazepines: NOT DETECTED
Cocaine: NOT DETECTED
Opiates: NOT DETECTED
Tetrahydrocannabinol: NOT DETECTED

## 2020-12-31 LAB — RESP PANEL BY RT-PCR (FLU A&B, COVID) ARPGX2
Influenza A by PCR: NEGATIVE
Influenza B by PCR: NEGATIVE
SARS Coronavirus 2 by RT PCR: NEGATIVE

## 2020-12-31 LAB — URINALYSIS, ROUTINE W REFLEX MICROSCOPIC
Bilirubin Urine: NEGATIVE
Glucose, UA: NEGATIVE mg/dL
Ketones, ur: NEGATIVE mg/dL
Leukocytes,Ua: NEGATIVE
Nitrite: NEGATIVE
Protein, ur: 30 mg/dL — AB
Specific Gravity, Urine: 1.02 (ref 1.005–1.030)
pH: 6 (ref 5.0–8.0)

## 2020-12-31 LAB — PHOSPHORUS: Phosphorus: 3.2 mg/dL (ref 2.5–4.6)

## 2020-12-31 LAB — ECHOCARDIOGRAM COMPLETE
Area-P 1/2: 3.97 cm2
S' Lateral: 2.7 cm

## 2020-12-31 LAB — LDL CHOLESTEROL, DIRECT: Direct LDL: 103.5 mg/dL — ABNORMAL HIGH (ref 0–99)

## 2020-12-31 LAB — MAGNESIUM: Magnesium: 2.4 mg/dL (ref 1.7–2.4)

## 2020-12-31 LAB — APTT: aPTT: 27 seconds (ref 24–36)

## 2020-12-31 LAB — HIV ANTIBODY (ROUTINE TESTING W REFLEX): HIV Screen 4th Generation wRfx: NONREACTIVE

## 2020-12-31 LAB — MRSA NEXT GEN BY PCR, NASAL: MRSA by PCR Next Gen: NOT DETECTED

## 2020-12-31 MED ORDER — METOCLOPRAMIDE HCL 5 MG/ML IJ SOLN
10.0000 mg | Freq: Once | INTRAMUSCULAR | Status: AC
  Administered 2020-12-31: 10 mg via INTRAVENOUS
  Filled 2020-12-31: qty 2

## 2020-12-31 MED ORDER — PANTOPRAZOLE 2 MG/ML SUSPENSION
40.0000 mg | Freq: Every day | ORAL | Status: DC
  Administered 2020-12-31 – 2021-04-29 (×119): 40 mg
  Filled 2020-12-31 (×119): qty 20

## 2020-12-31 MED ORDER — POTASSIUM CHLORIDE 10 MEQ/100ML IV SOLN
10.0000 meq | INTRAVENOUS | Status: AC
  Administered 2020-12-31 (×6): 10 meq via INTRAVENOUS

## 2020-12-31 MED ORDER — LABETALOL HCL 5 MG/ML IV SOLN
10.0000 mg | INTRAVENOUS | Status: DC | PRN
  Administered 2020-12-31 (×2): 10 mg via INTRAVENOUS
  Filled 2020-12-31: qty 4

## 2020-12-31 MED ORDER — CLONIDINE HCL 0.1 MG PO TABS
0.1000 mg | ORAL_TABLET | Freq: Three times a day (TID) | ORAL | Status: DC
  Administered 2020-12-31 – 2021-01-01 (×2): 0.1 mg
  Filled 2020-12-31 (×2): qty 1

## 2020-12-31 MED ORDER — OSMOLITE 1.5 CAL PO LIQD
1000.0000 mL | ORAL | Status: DC
  Filled 2020-12-31: qty 1000

## 2020-12-31 MED ORDER — LABETALOL HCL 5 MG/ML IV SOLN
5.0000 mg | INTRAVENOUS | Status: DC | PRN
Start: 2020-12-31 — End: 2021-03-21
  Administered 2020-12-31: 20 mg via INTRAVENOUS
  Administered 2020-12-31: 10 mg via INTRAVENOUS
  Administered 2020-12-31 – 2021-01-01 (×10): 20 mg via INTRAVENOUS
  Administered 2021-01-01: 10 mg via INTRAVENOUS
  Administered 2021-01-03 – 2021-01-21 (×15): 20 mg via INTRAVENOUS
  Administered 2021-01-24: 08:00:00 10 mg via INTRAVENOUS
  Administered 2021-01-25 – 2021-02-12 (×5): 20 mg via INTRAVENOUS
  Filled 2020-12-31 (×33): qty 4

## 2020-12-31 MED ORDER — SENNOSIDES-DOCUSATE SODIUM 8.6-50 MG PO TABS
1.0000 | ORAL_TABLET | Freq: Two times a day (BID) | ORAL | Status: DC
  Administered 2020-12-31 – 2021-01-08 (×15): 1
  Filled 2020-12-31 (×15): qty 1

## 2020-12-31 MED ORDER — AMLODIPINE BESYLATE 10 MG PO TABS
10.0000 mg | ORAL_TABLET | Freq: Every day | ORAL | Status: DC
  Administered 2020-12-31 – 2021-01-12 (×13): 10 mg
  Filled 2020-12-31 (×15): qty 1

## 2020-12-31 MED ORDER — CHLORHEXIDINE GLUCONATE CLOTH 2 % EX PADS
6.0000 | MEDICATED_PAD | Freq: Every day | CUTANEOUS | Status: DC
  Administered 2020-12-31: 6 via TOPICAL

## 2020-12-31 MED ORDER — METOPROLOL TARTRATE 25 MG PO TABS: 12.5000 mg | ORAL_TABLET | Freq: Two times a day (BID) | ORAL | Status: DC

## 2020-12-31 MED ORDER — DIPHENHYDRAMINE HCL 50 MG/ML IJ SOLN
12.5000 mg | Freq: Once | INTRAMUSCULAR | Status: AC
  Administered 2020-12-31: 12.5 mg via INTRAVENOUS
  Filled 2020-12-31: qty 1

## 2020-12-31 MED ORDER — HYDRALAZINE HCL 20 MG/ML IJ SOLN
10.0000 mg | INTRAMUSCULAR | Status: DC | PRN
  Administered 2020-12-31 (×2): 10 mg via INTRAVENOUS
  Filled 2020-12-31: qty 1

## 2020-12-31 MED ORDER — ORAL CARE MOUTH RINSE
15.0000 mL | Freq: Two times a day (BID) | OROMUCOSAL | Status: DC
  Administered 2020-12-31 – 2021-01-02 (×5): 15 mL via OROMUCOSAL

## 2020-12-31 MED ORDER — LABETALOL HCL 5 MG/ML IV SOLN
10.0000 mg | Freq: Once | INTRAVENOUS | Status: AC
  Administered 2020-12-31: 10 mg via INTRAVENOUS
  Filled 2020-12-31: qty 4

## 2020-12-31 MED ORDER — PROSOURCE TF PO LIQD
45.0000 mL | Freq: Every day | ORAL | Status: DC
  Administered 2020-12-31 – 2021-01-15 (×16): 45 mL
  Filled 2020-12-31 (×16): qty 45

## 2020-12-31 MED ORDER — METOPROLOL TARTRATE 50 MG PO TABS
50.0000 mg | ORAL_TABLET | Freq: Two times a day (BID) | ORAL | Status: DC
  Administered 2020-12-31 – 2021-01-01 (×2): 50 mg
  Filled 2020-12-31 (×2): qty 1

## 2020-12-31 MED ORDER — CHLORHEXIDINE GLUCONATE 0.12 % MT SOLN
15.0000 mL | Freq: Two times a day (BID) | OROMUCOSAL | Status: DC
  Administered 2020-12-31 – 2021-01-02 (×7): 15 mL via OROMUCOSAL
  Filled 2020-12-31: qty 15

## 2020-12-31 NOTE — Progress Notes (Signed)
OT Cancellation Note  Patient Details Name: Stephen Delgado MRN: [id number] DOB: October 27, 1965   Cancelled Treatment:    Reason Eval/Treat Not Completed: Active bedrest order OT order received and appreciated however this conflicts with current bedrest order set. Please increase activity tolerance as appropriate and remove bedrest from orders. . Please contact OT at [phone] if bed rest order is discontinued. OT will hold evaluation at this time and will check back as time allows pending increased activity orders.   Wynona Neat, OTR/L  Acute Rehabilitation Services Pager: [phone] Office: [phone] .  12/31/2020, 8:35 AM

## 2020-12-31 NOTE — Evaluation (Signed)
Clinical/Bedside Swallow Evaluation Patient Details  Name: Stephen Delgado MRN: [id number] Date of Birth: 05-21-1965  Today's Date: 12/31/2020 Time: SLP Start Time (ACUTE ONLY): 1023 SLP Stop Time (ACUTE ONLY): 1037 SLP Time Calculation (min) (ACUTE ONLY): 14 min  Past Medical History:  Past Medical History:  Diagnosis Date   Allergy    Diabetes    Hypertension    Past Surgical History:  Past Surgical History:  Procedure Laterality Date   NO PAST SURGERIES     HPI:  55 yo M with PMHx significant for HTN, DM, CKD 3, presenting to ED with headache, nausea, and vomiting. Imaging revealed left caudate basal ganglia hemorrhage with IVH and pt hypertensive upon arrival.    Assessment / Plan / Recommendation  Clinical Impression  Pt was seen for a bedside swallow evaluation. Pt moving between states of sleep and being awake, minimally responsive to painful stimuli and intermittently opening eyes. Pt not following commands necessary for oral mechanism exam completion. RN completed oral care and SLP trialed ice chips, thin liquid from teaspoon, and puree solids. Pt exhibited no oral phase impairment with ice chips and thin liquid, however unable to open mouth sufficiently to receive puree. Pt exhibited no overt s/s of penetration or aspiration with ice chips or water. Recommend continue NPO, administering medications via alternate means until pt alertness improves. SLP will continue to follow for PO trials as appropriate. SLP Visit Diagnosis: Dysphagia, unspecified (R13.10)    Aspiration Risk  Mild aspiration risk    Diet Recommendation NPO   Medication Administration: Via alternative means    Other  Recommendations Oral Care Recommendations: Oral care QID    Recommendations for follow up therapy are one component of a multi-disciplinary discharge planning process, led by the attending physician.  Recommendations may be updated based on patient status, additional functional criteria and  insurance authorization.  Follow up Recommendations Follow physician's recommendations for discharge plan and follow up therapies      Assistance Recommended at Discharge PRN  Functional Status Assessment Patient has had a recent decline in their functional status and demonstrates the ability to make significant improvements in function in a reasonable and predictable amount of time.  Frequency and Duration min 2x/week  2 weeks       Prognosis Prognosis for Safe Diet Advancement: Good Barriers to Reach Goals: Severity of deficits      Swallow Study   General Date of Onset: 12/31/20 HPI: 55 yo M with PMHx significant for HTN, DM, CKD 3, presenting to ED with headache, nausea, and vomiting. Imaging revealed left caudate basal ganglia hemorrhage with IVH and pt hypertensive upon arrival. Type of Study: Bedside Swallow Evaluation Previous Swallow Assessment: n/a Diet Prior to this Study: NPO Temperature Spikes Noted: No Respiratory Status: Room air History of Recent Intubation: No Behavior/Cognition: Lethargic/Drowsy;Requires cueing;Cooperative Oral Cavity Assessment: Within Functional Limits Oral Care Completed by SLP: Recent completion by staff Oral Cavity - Dentition: Adequate natural dentition Vision: Impaired for self-feeding Self-Feeding Abilities: Total assist Patient Positioning: Upright in bed Baseline Vocal Quality: Not observed Volitional Cough: Cognitively unable to elicit Volitional Swallow: Unable to elicit    Oral/Motor/Sensory Function Overall Oral Motor/Sensory Function:  (tba)   Ice Chips Ice chips: Within functional limits Presentation: Spoon   Thin Liquid Thin Liquid: Within functional limits Presentation: Spoon    Nectar Thick Nectar Thick Liquid: Not tested   Honey Thick Honey Thick Liquid: Not tested   Puree Puree: Impaired Presentation: Spoon Oral Phase Impairments: Poor awareness  of bolus   Solid     Solid: Not tested     Jeannie Done,  SLP-Student  Jeannie Done 12/31/2020,11:46 AM

## 2020-12-31 NOTE — ED Notes (Signed)
[facility] contacted, transport arranged from patient.

## 2020-12-31 NOTE — Procedures (Signed)
Cortrak  Person Inserting Tube:  Joslynne Klatt L, RD Tube Type:  Cortrak - 43 inches Tube Size:  10 Tube Location:  Right nare Initial Placement:  Stomach Secured by: Bridle Technique Used to Measure Tube Placement:  Marking at nare/corner of mouth Cortrak Secured At:  65 cm  Cortrak Tube Team Note:  Consult received to place a Cortrak feeding tube.   X-ray is required, abdominal x-ray has been ordered by the Cortrak team. Please confirm tube placement before using the Cortrak tube.   If the tube becomes dislodged please keep the tube and contact the Cortrak team at [email or web address] ([email or web address]) for replacement.  If after hours and replacement cannot be delayed, place a NG tube and confirm placement with an abdominal x-ray.    Suzzanne Brunkhorst BS, PLDN Clinical Dietitian See AMiON for contact information.    

## 2020-12-31 NOTE — Progress Notes (Addendum)
STROKE TEAM PROGRESS NOTE   ATTENDING NOTE: I reviewed above note and agree with the assessment and plan. Pt was seen and examined.   55 year old male with history hypertension, diabetes, CKD 3 admitted for headache, nausea vomiting, confusion, altered mental status.  CT showed left caudate head ICH with IVH.  Repeat CT stable ICH and IVH.  More somnolence over the day, repeat CT again this afternoon shows stable ICH and IVH, no significant hydrocephalus.  MRI and MRA pending in the a.m.  EF 50 to 55%, renal artery ultrasound in 2017 showed no renal artery stenosis bilaterally.  Carotid Doppler pending.  UDS negative, A1c pending, LDL 103.5.  On exam, patient eyes closed, not open with voice.  With forced eye opening, eyes mid position, spontaneous rolling side-to-side intermittently.  Inconsistently blinking to visual threat on the left but not to the right.  Right facial droop.  Right upper and lower extremity flaccid.  Left upper and lower extremity localizing to pain. Sensation, coordination and gait not tested.  Etiology for patient ICH likely due to hypertensive emergency.  Currently on maximized dose of Cleviprex.  After core track placement, will start tube feeding and BP meds and to taper off Cleviprex as able.  Close monitoring and neuro check, pending MRI/MRA to evaluate hydrocephalus in a.m.  If hydrocephalus present, may consider neurosurgery consultation.  PT/OT pending.  For detailed assessment and plan, please refer to above as I have made changes wherever appropriate.   Rosalin Hawking, MD PhD Stroke Neurology 12/31/2020 6:31 PM  This patient is critically ill due to [place] with IVH, hypertensive emergency, CKD and at significant risk of neurological worsening, death form hematoma expansion, hydrocephalus, hypertensive encephalopathy, renal failure. This patient's care requires constant monitoring of vital signs, hemodynamics, respiratory and cardiac monitoring, review of multiple  databases, neurological assessment, discussion with family, other specialists and medical decision making of high complexity. I spent 40 minutes of neurocritical care time in the care of this patient.    INTERVAL HISTORY Patient is seen in his room with no family at the bedside.  Yesterday, he was found to have altered mental status and headache and was brought to the ED.  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  Overnight, he has been hypertensive and has required Cleviprex for BP control.  He has also been agitated and has required restraints.  Vitals:   12/31/20 1435 12/31/20 1500 12/31/20 1530 12/31/20 1600  BP:  (!) 147/76 (!) 126/96 (!) 143/82  Pulse: 98 95 (!) 101 92  Resp: (!) 21 20 (!) 22 20  Temp:    (!) 100.5 F (38.1 C)  TempSrc:    Axillary  SpO2: 100% 100% 100% 100%  Weight: 85.2 kg      CBC:  Recent Labs  Lab 12/30/20 1720 12/31/20 0240  WBC 15.1* 20.0*  NEUTROABS 13.3*  --   HGB 14.5 14.7  HCT 45.6 43.4  MCV 74.4* 73.9*  PLT 243 [id number]   Basic Metabolic Panel:  Recent Labs  Lab 12/30/20 1720 12/31/20 0240  NA 139 137  K 3.2* 3.2*  CL 99 99  CO2 28 24  GLUCOSE 149* 184*  BUN 24* 23*  CREATININE 1.71* 1.83*  CALCIUM 9.5 9.3   Lipid Panel:  Recent Labs  Lab 12/31/20 0240  CHOL 175  TRIG 481*  HDL 45  CHOLHDL 3.9  VLDL UNABLE TO CALCULATE IF TRIGLYCERIDE OVER 400 mg/dL  LDLCALC UNABLE  TO CALCULATE IF TRIGLYCERIDE OVER 400 mg/dL   ESLP5P: No results for input(s): HGBA1C in the last 168 hours. Urine Drug Screen:  Recent Labs  Lab 12/31/20 0822  LABOPIA NONE DETECTED  COCAINSCRNUR NONE DETECTED  LABBENZ NONE DETECTED  AMPHETMU NONE DETECTED  THCU NONE DETECTED  LABBARB NONE DETECTED    Alcohol Level No results for input(s): ETH in the last 168 hours.  IMAGING past 24 hours DG Chest 1 View  Result Date: 12/31/2020 CLINICAL DATA:  Cerebral hemorrhage. EXAM: CHEST  1 VIEW COMPARISON:  PA and  lateral chest 04/19/2015. FINDINGS: There is mild-to-moderate cardiomegaly without evidence of CHF. The mediastinum is normally outlined. Both lungs are clear. The visualized skeletal structures are unremarkable. IMPRESSION: No evidence of acute chest disease or interval changes. Stable chest with cardiomegaly. Electronically Signed   By: Almira Bar M.D.   On: 12/31/2020 03:58   CT HEAD WO CONTRAST ( )  Result Date: 12/31/2020 CLINICAL DATA:  Stroke follow-up EXAM: CT HEAD WITHOUT CONTRAST TECHNIQUE: Contiguous axial images were obtained from the base of the skull through the vertex without intravenous contrast. COMPARISON:  12/30/2020 FINDINGS: Brain: Unchanged distribution of intraventricular hemorrhage with unchanged mild communicating hydrocephalus. Intraparenchymal hemorrhage component in the left caudate head is unchanged. There is periventricular hypoattenuation compatible with chronic microvascular disease. Vascular: No abnormal hyperdensity of the major intracranial arteries or dural venous sinuses. No intracranial atherosclerosis. Skull: The visualized skull base, calvarium and extracranial soft tissues are normal. Sinuses/Orbits: No fluid levels or advanced mucosal thickening of the visualized paranasal sinuses. No mastoid or middle ear effusion. The orbits are normal. IMPRESSION: 1. Unchanged distribution of intraventricular hemorrhage with unchanged mild communicating hydrocephalus. 2. Unchanged left caudate head intraparenchymal hemorrhage. Electronically Signed   By: Deatra Robinson M.D.   On: 12/31/2020 01:21   CT Head Wo Contrast  Result Date: 12/30/2020 CLINICAL DATA:  Headache.  Concern for intracranial hemorrhage. EXAM: CT HEAD WITHOUT CONTRAST TECHNIQUE: Contiguous axial images were obtained from the base of the skull through the vertex without intravenous contrast. COMPARISON:  None. FINDINGS: Brain: There is a large amount of intraventricular hemorrhage involving the lateral,  third, and fourth ventricle. There is intraparenchymal hemorrhage within the left basal ganglia. No significant hydrocephalus. There is moderate periventricular and deep white matter chronic microvascular ischemic changes. No significant mass effect or midline shift. Vascular: No hyperdense vessel or unexpected calcification. Skull: Normal. Negative for fracture or focal lesion. Sinuses/Orbits: Partially visualized 2 cm left maxillary sinus retention cyst or polyp. The remainder of the visualized paranasal sinuses and mastoid air cells are clear. Other: None IMPRESSION: 1. Left basal ganglia intraparenchymal hemorrhage with extension of a large amount of blood into the ventricular system. No significant mass effect or midline shift. 2. Moderate chronic microvascular ischemic changes. These results were called by telephone at the time of interpretation on 12/30/2020 at 5:45 pm to Dr. Deanna Artis , who verbally acknowledged these results. Electronically Signed   By: Elgie Collard M.D.   On: 12/30/2020 17:49   ECHOCARDIOGRAM COMPLETE  Result Date: 12/31/2020    ECHOCARDIOGRAM REPORT   Patient Name:   Stephen Delgado [facility] Date of Exam: 12/31/2020 Medical Rec #:  [id number]      Height:       72.0 in Accession #:    [id number]     Weight:       196.4 lb Date of Birth:  07-12-1965     BSA:          2.114  m Patient Age:    55 years       BP:           166/90 mmHg Patient Gender: M              HR:           107 bpm. Exam Location:  Inpatient Procedure: 2D Echo, Color Doppler, Cardiac Doppler and Strain Analysis Indications:    Stroke i63.9  History:        Patient has prior history of Echocardiogram examinations, most                 recent 04/21/2015. Risk Factors:Hypertension, Diabetes and                 Dyslipidemia.  Sonographer:    Irving BurtonEmily Senior RDCS Referring Phys: [phone] ERIC LINDZEN IMPRESSIONS  1. Left ventricular ejection fraction, by estimation, is 50 to 55%. The left ventricle has low normal function. The left  ventricle has no regional wall motion abnormalities. There is moderate concentric left ventricular hypertrophy. Left ventricular diastolic parameters are consistent with Grade I diastolic dysfunction (impaired relaxation).  2. Right ventricular systolic function is normal. The right ventricular size is normal.  3. The pericardial effusion is circumferential.  4. The mitral valve is normal in structure. Trivial mitral valve regurgitation. No evidence of mitral stenosis.  5. The aortic valve is normal in structure. Aortic valve regurgitation is mild. No aortic stenosis is present.  6. There is borderline dilatation of the ascending aorta, measuring 37 mm.  7. The inferior vena cava is normal in size with greater than 50% respiratory variability, suggesting right atrial pressure of 3 mmHg. Comparison(s): A prior study was performed on 04/20/2015. No significant change from prior study. FINDINGS  Left Ventricle: Left ventricular ejection fraction, by estimation, is 50 to 55%. The left ventricle has low normal function. The left ventricle has no regional wall motion abnormalities. Global longitudinal strain performed but not reported based on interpreter judgement due to suboptimal tracking. The left ventricular internal cavity size was normal in size. There is moderate concentric left ventricular hypertrophy. Left ventricular diastolic parameters are consistent with Grade I diastolic dysfunction (impaired relaxation). Right Ventricle: The right ventricular size is normal. No increase in right ventricular wall thickness. Right ventricular systolic function is normal. Left Atrium: Left atrial size was normal in size. Right Atrium: Right atrial size was normal in size. Pericardium: Trivial pericardial effusion is present. The pericardial effusion is circumferential. Presence of epicardial fat layer. Mitral Valve: The mitral valve is normal in structure. There is mild thickening of the mitral valve leaflet(s). Trivial mitral  valve regurgitation. No evidence of mitral valve stenosis. Tricuspid Valve: The tricuspid valve is normal in structure. Tricuspid valve regurgitation is trivial. No evidence of tricuspid stenosis. Aortic Valve: The aortic valve is normal in structure. There is mild aortic valve annular calcification. Aortic valve regurgitation is mild. No aortic stenosis is present. Pulmonic Valve: The pulmonic valve was not well visualized. Pulmonic valve regurgitation is trivial. No evidence of pulmonic stenosis. Aorta: There is borderline dilatation of the ascending aorta, measuring 37 mm. Venous: The inferior vena cava is normal in size with greater than 50% respiratory variability, suggesting right atrial pressure of 3 mmHg. IAS/Shunts: No atrial level shunt detected by color flow Doppler.  LEFT VENTRICLE PLAX 2D LVIDd:         4.50 cm   Diastology LVIDs:         2.70 cm  LV e' medial:    5.22 cm/s LV PW:         1.40 cm   LV E/e' medial:  13.0 LV IVS:        1.30 cm   LV e' lateral:   5.00 cm/s LVOT diam:     2.10 cm   LV E/e' lateral: 13.5 LV SV:         86 LV SV Index:   41 LVOT Area:     3.46 cm  RIGHT VENTRICLE RV S prime:     20.50 cm/s TAPSE (M-mode): 2.3 cm LEFT ATRIUM             Index        RIGHT ATRIUM           Index LA diam:        3.90 cm 1.84 cm/m   RA Area:     15.50 cm LA Vol (A2C):   66.3 ml 31.36 ml/m  RA Volume:   38.10 ml  18.02 ml/m LA Vol (A4C):   42.7 ml 20.20 ml/m LA Biplane Vol: 53.8 ml 25.45 ml/m  AORTIC VALVE LVOT Vmax:   152.00 cm/s LVOT Vmean:  108.000 cm/s LVOT VTI:    0.249 m  AORTA Ao Root diam: 3.50 cm Ao Asc diam:  3.70 cm MITRAL VALVE MV Area (PHT): 3.97 cm     SHUNTS MV Decel Time: 191 msec     Systemic VTI:  0.25 m MV E velocity: 67.70 cm/s   Systemic Diam: 2.10 cm MV A velocity: 114.00 cm/s MV E/A ratio:  0.59 Kardie Tobb DO Electronically signed by Thomasene Ripple DO Signature Date/Time: 12/31/2020/12:21:54 PM    Final     PHYSICAL EXAM General:  Patient is a well-developed,  well-nourished male in no acute distress  Neurological:  Patient does not respond to name and is unable to answer questions or follow commands.  PERRL. EOMI with blinking to threat more on left than right.  He will localize noxious stimuli with the left hand and will move the left leg to noxious stimuli but does not move the right arm or leg.  ASSESSMENT/PLAN Mr. HARLIS CHAMPOUX is a 55 y.o. male with history of HTN (questionable compliance with medications), DM and CKD 3 presenting with headache and altered mental status. 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  He was hypertensive on arrival to 238/160 and was given Cleviprex for BP control.  He was also agitated and required restraints.  Repeat head CT shows unchanged ICH with IVH  ICH score 2  ICH with IVH:  left basal ganglia ICH with IVH likely secondary to hypertensive emergency CT head Large basal ganglia IPH with IVH involving the lateral, third and fourth ventricles with no significant mass effect or midline shift.  Repeat CT 11/30 0116 Unchanged IPH with IVH Repeat CT head 11/30 1629  Stable IPH with IVH, stable mild hydrocephalus 2D Echo EF 50-55%, grade 1 diastolic dysfunction, no atrial level shunt MRI pending MRA pending LDL UNABLE TO CALCULATE IF TRIGLYCERIDE OVER 400 mg/dL [id number] No results found for requested labs within last 04540 hours. VTE prophylaxis - SCDs aspirin 81 mg daily prior to admission, now on No antithrombotic secondary to IPH Therapy recommendations:  pending Disposition:  pending  Hypertensive emergency Home meds:  Amlodipine 10 mg daily, metoprolol succinate 25 mg daily,  Unstable Requiring cleviprex, wean as able SBP goal <160  Add home amlodipine 10 mg daily and metoprolol 12.5 mg BID Long-term BP goal normotensive  Hyperlipidemia Home meds:  none LDL 103.5, goal < 70 Hold off statin for now due to acute ICH Consider statin at discharge  Other Stroke Risk  Factors Questionable compliance with medications at home  Other Active Problems Hypokalemia 3.2 -> supplement Low grade fever - CXR, UA neg. Tmax 100.5 - close monitoring  Hospital day # Bent , MSN, AGACNP-BC [facility] See Amion for schedule and pager information 12/31/2020 4:50 PM    To contact Stroke Continuity provider, please refer to [email or web address] After hours, contact General Neurology

## 2020-12-31 NOTE — Progress Notes (Signed)
Around 1545 during his bath, pt was more somnolent, not opening eyes.  Dr. Roda Shutters called and stat CT ordered. Results said no changes from previous scan.

## 2020-12-31 NOTE — Progress Notes (Signed)
PT Cancellation Note  Patient Details Name: EMIEL KIELTY MRN: [id number] DOB: 29-Jun-1965   Cancelled Treatment:    Reason Eval/Treat Not Completed: Active bedrest order Will evaluate once activity orders are updated.   [place] Valli A. Dan Humphreys PT, DPT Acute Rehabilitation Services Pager [phone] Office [phone]    Viviann Spare 12/31/2020, 8:00 AM

## 2020-12-31 NOTE — Progress Notes (Signed)
Initial Nutrition Assessment  DOCUMENTATION CODES:   Not applicable  INTERVENTION:   Once Cortrak placed and placement is confirmed, initiate tube feeds: - Osmolite 1.5 @ 60 ml/hr (1440 ml/day) - ProSource TF 45 ml daily  Tube feeding regimen provides 2200 kcal, 101 grams of protein, and 1097 ml of H2O.   NUTRITION DIAGNOSIS:   Inadequate oral intake related to lethargy/confusion, dysphagia as evidenced by NPO status.  GOAL:   Patient will meet greater than or equal to 90% of their needs  MONITOR:   Diet advancement, Labs, Weight trends  REASON FOR ASSESSMENT:   Ventilator, Consult Enteral/tube feeding initiation and management  ASSESSMENT:   55 year old male who presented to the ED on 11/29 with AMS. PMH of HTN, T2DM, CKD stage III. 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  Consult received for tube feeding initiation and management. Plan is for Cortrak placement today. Pt failed swallow evaluation with SLP and remains NPO.  Unable to obtain diet and weight history at this time. Reviewed weight history in chart. Pt with a 3.9 kg weight loss since 02/06/20. This is a 4.4% weight loss in 11 months which is not significant for timeframe.  Medications reviewed and include: IV protonix, senna, cleviprex @ 64 ml/hr (provides 3072 kcal daily from lipid)  Labs reviewed: potassium 3.2, BUN 23, creatinine 1.83, TG 481 CBG's: 136  I/O's: +1.2 L since admit  NUTRITION - FOCUSED PHYSICAL EXAM:  Unable to complete at this time. RD working remotely.  Diet Order:   Diet Order             Diet NPO time specified  Diet effective now                   EDUCATION NEEDS:   Not appropriate for education at this time  Skin:  Skin Assessment: Reviewed RN Assessment  Last BM:  no documented BM  Height:   Ht Readings from Last 1 Encounters:  02/06/20 6' (1.829 m)    Weight:   Wt Readings from Last 1  Encounters:  12/31/20 85.2 kg    BMI:  Body mass index is 25.47 kg/m.  Estimated Nutritional Needs:   Kcal:  2000-2200  Protein:  100-115 grams  Fluid:  >/= 2.0 L    Mertie Clause, MS, RD, LDN Inpatient Clinical Dietitian Please see AMiON for contact information.

## 2020-12-31 NOTE — TOC CAGE-AID Note (Addendum)
Transition of Care [facility]) - CAGE-AID Screening   Patient Details  Name: Stephen Delgado MRN: [id number] Date of Birth: 04-21-1965  Transition of Care [facility]) CM/SW Contact:    Bralyn Folkert C Tarpley-Carter, LCSWA Phone Number: 12/31/2020, 8:19 AM   Clinical Narrative: Pt is unable to participate in Cage Aid.  CSW provided pt with resources for possible future use.  Harless Molinari Tarpley-Carter, MSW, LCSW-A Pronouns:  She/Her/Hers [facility]itions of Care Clinical Social Worker Direct Number:  [phone] [email or web address]  CAGE-AID Screening: Substance Abuse Screening unable to be completed due to: : Patient unable to participate             Substance Abuse Education Offered: No

## 2020-12-31 NOTE — Progress Notes (Signed)
1900- neuro paged, abd xray resulted post coretrack insertion. Coretrack in place, incidental finding of ileus. Verbal order to hold tube feeding at this time, but can give patient his medications. New order for iv benadryl and iv reglan to assist with bowel motility. Will continue to monitor.  2200- neuro paged about pt's snoring respirations and to assess if pt is okay for MRI tonight. Verbal order to hold off on MRI bc of patient's neuro status. Order moved to after 8 am. Head CT tonight at midnight if pt still having snoring respirations.

## 2020-12-31 NOTE — Progress Notes (Signed)
Echocardiogram 2D Echocardiogram has been performed.  Warren Lacy Shantia Sanford RDCS 12/31/2020, 10:22 AM

## 2020-12-31 NOTE — H&P (Signed)
Neurology H&P ICH, IVH CC: Headache. AMS  History is obtained from:chart, son  HPI: Stephen Delgado is a 55 y.o. male pmh of HTN, questionable compliance to medications, DM, CKD 3, who was in usual state of health Monday when he started to have some headache past dinner time 8pm. Also had nausea and episode of vomiting. Went to bed, and was difficult to arouse. Appeared confused and conitnued to complain of headache and had nasuea vomiting. Brought for eval at [facility] where a CTH was done that showed a left caudate/BG hemorrhage with IVH.  Also hypertensive on arrival. Extremely agitated requiring wrist restraints as well as IV Cleviprex for blood pressure management. Called into the neurology service for admission-accepted by Dr. Cheral Marker.  Patient arrived shortly before 1:30 AM to the neuro ICU. Seen and evaluated by me personally.  History obtained from the son who accompanied him. No preceding illnesses or sicknesses.  Patient was not complaining of chest pain or discomfort.  No complaints of abdominal pain but had nausea and vomiting as described above.  No sick contact recently.  Neurosurgery was consulted by the ED provider.  No surgical intervention offered.  Started on Cleviprex.  Required Cleviprex up to maximum of 32 for adequate systolic blood pressure control to less than 140.  Repeat head CT ordered per Dr. Yvetta Coder recommendations with unchanged distribution of the ICH, IVH and mild communicating hydrocephalus.   LKW: Monday night 9 PM-12/29/2020 tpa given?: no, ICH Premorbid modified Rankin scale (mRS): 0 ICH score:-2 (1 for GCS of 12, 1 for IVH)   ROS: Full ROS was performed and is negative except as noted in the HPI.  Past Medical History:  Diagnosis Date   Allergy    Diabetes    Hypertension   CKD 3  Family History  Problem Relation Age of Onset   Stroke Paternal Uncle    Social History:   reports that he has never smoked. He has never used smokeless tobacco. He  reports current alcohol use. He reports that he does not use drugs. Non-smoker.  No history of drug use.  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  Medications  Current Facility-Administered Medications:    acetaminophen (TYLENOL) tablet 650 mg, 650 mg, Oral, Q4H PRN **OR** acetaminophen (TYLENOL) 160 MG/5ML solution 650 mg, 650 mg, Per Tube, Q4H PRN **OR** acetaminophen (TYLENOL) suppository 650 mg, 650 mg, Rectal, Q4H PRN, Kerney Elbe, MD   clevidipine (CLEVIPREX) infusion 0.5 mg/mL, 0-21 mg/hr, Intravenous, Continuous, Kerney Elbe, MD, Last Rate: 42 mL/hr at 12/31/20 0023, 21 mg/hr at 12/31/20 0023   pantoprazole (PROTONIX) injection 40 mg, 40 mg, Intravenous, QHS, Kerney Elbe, MD, 40 mg at 12/30/20 2137   senna-docusate (Senokot-S) tablet 1 tablet, 1 tablet, Oral, BID, Kerney Elbe, MD   Exam: Current vital signs: BP (!) 195/116   Pulse (!) 103   Temp 98.6 F (37 C) (Oral)   Resp 17   SpO2 100%  Vital signs in last 24 hours: Temp:  [98.6 F (37 C)-98.9 F (37.2 C)] 98.6 F (37 C) (11/30 0021) Pulse Rate:  [92-124] 103 (11/30 0021) Resp:  [15-26] 17 (11/30 0021) BP: (155-238)/(81-160) 195/116 (11/30 0021) SpO2:  [93 %-100 %] 100 % (11/30 0021) Most recent systolic blood pressure [id number] General: Sleepy, does not open eyes to voice but follows commands. HEENT: Normocephalic/atraumatic CVs: Regular rhythm Respiratory: Breathing well saturating normally on room air Abdomen nondistended nontender Extremities warm well perfused Neurologic exam He is sleepy, does not open eyes to voice  but is able to follow commands-able to give me thumbs up and left arms and legs to command. He is not very verbal at this time Cranial nerves: Pupils equal round react light, extraocular movements difficult to assess, does not blink to threat from either side, no gaze deviation or preference, face appears symmetric, tongue and palate midline. Motor examination with right lower extremity drift, mild  right upper extremity weakness, full strength left upper and lower extremity. Sensation intact Coordination difficult to assess given mentation GCS 12 NIH stroke scale-8 1A: Level of consciousness --> 2 = Requires repeated stimulation to arouse 1B: Ask month and age --> 2 = 0 questions right  1C: 'Blink eyes' & 'squeeze hands' --> 0 = Performs both tasks 2: Horizontal extraocular movements --> 0 = Normal 3: Visual fields --> 0 = No visual loss 4: Facial palsy --> 0 = Normal symmetry 5A: Left arm motor drift --> 0 = No drift for 10 seconds 5B: Right arm motor drift --> 1 = Drift, but doesn't hit bed 6A: Left leg motor drift --> 0 = No drift for 5 seconds 6B: Right leg motor drift --> 1 = Drift, but doesn't hit bed 7: Limb Ataxia --> 0 = No ataxia 8: Sensation --> 0 = Normal; no sensory loss 9: Language/aphasia --> 2 = Severe aphasia: fragmentary expression, inference needed, cannot identify materials 10: Dysarthria --> 0 = Normal 11: Extinction/inattention --> 0 = No abnormality  Labs I have reviewed labs in epic and the results pertinent to this consultation are:  CBC    Component Value Date/Time   WBC 15.1 (H) 12/30/2020 1720   RBC 6.13 (H) 12/30/2020 1720   HGB 14.5 12/30/2020 1720   HCT 45.6 12/30/2020 1720   PLT 243 12/30/2020 1720   MCV 74.4 (L) 12/30/2020 1720   MCH 23.7 (L) 12/30/2020 1720   MCHC 31.8 12/30/2020 1720   RDW 14.8 12/30/2020 1720   LYMPHSABS 1.1 12/30/2020 1720   MONOABS 0.6 12/30/2020 1720   EOSABS 0.0 12/30/2020 1720   BASOSABS 0.0 12/30/2020 1720    CMP     Component Value Date/Time   NA 139 12/30/2020 1720   NA 145 (H) 05/31/2018 1352   K 3.2 (L) 12/30/2020 1720   CL 99 12/30/2020 1720   CO2 28 12/30/2020 1720   GLUCOSE 149 (H) 12/30/2020 1720   BUN 24 (H) 12/30/2020 1720   BUN 22 05/31/2018 1352   CREATININE 1.71 (H) 12/30/2020 1720   CALCIUM 9.5 12/30/2020 1720   PROT 8.3 (H) 12/30/2020 2250   ALBUMIN 4.2 12/30/2020 2250   AST 34  12/30/2020 2250   ALT 25 12/30/2020 2250   ALKPHOS 60 12/30/2020 2250   BILITOT 0.7 12/30/2020 2250   GFRNONAA 46 (L) 12/30/2020 1720   GFRAA 70 05/31/2018 1352    Lipid Panel     Component Value Date/Time   CHOL 191 02/26/2016 1648   TRIG 199 (H) 02/26/2016 1648   HDL 38 (L) 02/26/2016 1648   CHOLHDL 5.0 02/26/2016 1648   CHOLHDL 3.6 04/20/2015 0504   VLDL 14 04/20/2015 0504   LDLCALC 113 (H) 02/26/2016 1648     Imaging I have reviewed the images obtained:  CT-head-left caudate nucleus intracranial hemorrhage with intraventricular extension and mild communicating hydrocephalus Repeat CT head with redemonstration of the left caudate ICH with IVH extension and mild communicating hydrocephalus.  MRI examination of the brain-ordered and pending  Assessment: 55 year old with history of diabetes, hypertension noncompliant medications presenting with sudden  onset of altered mental status after he has been having headaches for about a night.  Noted to have a left basal ganglia hemorrhage with IVH-which is stable on CT head done after about 7 to 8 hours from the prior scan. Likely etiology of the intracerebral hemorrhage is hypertension/hypertensive emergency and noncompliance to medications  Plan: Subcortical ICH, nontraumatic IVH, nontraumatic  Acuity: Acute Laterality: Left Current suspected etiology: Hypertension Treatment: -Admit to neurological ICU -BP control goal SYS<140 -Neurosurgical consultation in the morning.  Will consider reengage neurosurgery if the examination were to worsen overnight for consideration of EVD placement -PT/OT/ST  -neuromonitoring  CNS Cerebral edema Compression of brain -Minimal mass-effect-we will hold off on hyperosmolar therapy for now -Neurosurgical consultation as above -Close neuro monitoring  Communicating hydrocephalus -Plan as above  Dysphagia following ICH  -NPO until cleared by speech for bedside swallow  evaluation -ST  Toxic encephalopathy -Correct metabolic causes and look for infectious causes and treat -Monitor  Hemiplegia and hemiparesis following nontraumatic intracerebral hemorrhage affecting right dominant side -Continue PT/OT/ST  RESP Evaluate for possible aspiration pneumonia Has leukocytosis. Get chest x-ray Will hold off on antibiotics for now  CV Hypertensive emergency Hypertensive encephalopathy -Aggressive BP control, goal SBP < 140 -Continue Plavix -Labetalol and hydralazine as needed -TTE  GI/GU Acute Kidney Failure Chart review-GFR in the range of CKD 3 -Gentle hydration -avoid nephrotoxic agents   HEME No acute issues -Monitor -transfuse for hgb < 7  Check for evidence of coagulopathy-check PTT, PT and INR  ENDO Type 2 diabetes mellitus w/o complications . Type 2 diabetes mellitus with hyperglycemia  -SSI -Start oral meds -goal HgbA1c < 7  Fluid/Electrolyte Disorders Hypokalemia-replete with IV Kcl Check labs in the morning   ID Possible Aspiration PNA -CXR -NPO -Monitor  Possible UTI -Urinalysis with reflex microscopy  Nutrition  Prophylaxis DVT: SCDs.  No antiplatelets or anticoagulants. GI: Pantoprazole Bowel: Docusate senna  Dispo:   Diet: NPO until cleared by speech/bedside swallow screen  Code Status: Full Code     Detailed discussion of the plan including possible neurosurgical intervention if needed-with the son at bedside. Stroke team to continue to follow.  THE FOLLOWING WERE PRESENT ON ADMISSION: Intracerebral hemorrhage, intraventricular hemorrhage, hypertensive emergency, hypokalemia, CKD 3, AKI, probable aspiration pneumonia  -- Amie Portland, MD Neurologist [facility] Pager: [phone]  CRITICAL CARE ATTESTATION Performed by: Amie Portland, MD Total critical care time: 55 minutes Critical care time was exclusive of separately billable procedures and treating other patients and/or  supervising APPs/Residents/Students Critical care was necessary to treat or prevent imminent or life-threatening deterioration due to [place], HTN emergency  This patient is critically ill and at significant risk for neurological worsening and/or death and care requires constant monitoring. Critical care was time spent personally by me on the following activities: development of treatment plan with patient and/or surrogate as well as nursing, discussions with consultants, evaluation of patient's response to treatment, examination of patient, obtaining history from patient or surrogate, ordering and performing treatments and interventions, ordering and review of laboratory studies, ordering and review of radiographic studies, pulse oximetry, re-evaluation of patient's condition, participation in multidisciplinary rounds and medical decision making of high complexity in the care of this patient.

## 2021-01-01 ENCOUNTER — Inpatient Hospital Stay (HOSPITAL_COMMUNITY): Payer: 59

## 2021-01-01 DIAGNOSIS — I615 Nontraumatic intracerebral hemorrhage, intraventricular: Secondary | ICD-10-CM | POA: Diagnosis not present

## 2021-01-01 DIAGNOSIS — I610 Nontraumatic intracerebral hemorrhage in hemisphere, subcortical: Secondary | ICD-10-CM | POA: Diagnosis not present

## 2021-01-01 DIAGNOSIS — R4182 Altered mental status, unspecified: Secondary | ICD-10-CM | POA: Diagnosis not present

## 2021-01-01 DIAGNOSIS — J9600 Acute respiratory failure, unspecified whether with hypoxia or hypercapnia: Secondary | ICD-10-CM | POA: Diagnosis not present

## 2021-01-01 DIAGNOSIS — I161 Hypertensive emergency: Secondary | ICD-10-CM | POA: Diagnosis not present

## 2021-01-01 DIAGNOSIS — I619 Nontraumatic intracerebral hemorrhage, unspecified: Secondary | ICD-10-CM | POA: Diagnosis not present

## 2021-01-01 LAB — CBC
HCT: 43.9 % (ref 39.0–52.0)
Hemoglobin: 15 g/dL (ref 13.0–17.0)
MCH: 25 pg — ABNORMAL LOW (ref 26.0–34.0)
MCHC: 34.2 g/dL (ref 30.0–36.0)
MCV: 73 fL — ABNORMAL LOW (ref 80.0–100.0)
Platelets: 258 10*3/uL (ref 150–400)
RBC: 6.01 MIL/uL — ABNORMAL HIGH (ref 4.22–5.81)
RDW: 14.6 % (ref 11.5–15.5)
WBC: 17.7 10*3/uL — ABNORMAL HIGH (ref 4.0–10.5)
nRBC: 0 % (ref 0.0–0.2)

## 2021-01-01 LAB — PHOSPHORUS
Phosphorus: 4.5 mg/dL (ref 2.5–4.6)
Phosphorus: 4.4 mg/dL (ref 2.5–4.6)

## 2021-01-01 LAB — BASIC METABOLIC PANEL
Anion gap: 10 (ref 5–15)
BUN: 33 mg/dL — ABNORMAL HIGH (ref 6–20)
CO2: 24 mmol/L (ref 22–32)
Calcium: 9.2 mg/dL (ref 8.9–10.3)
Chloride: 101 mmol/L (ref 98–111)
Creatinine, Ser: 2.23 mg/dL — ABNORMAL HIGH (ref 0.61–1.24)
GFR, Estimated: 34 mL/min — ABNORMAL LOW (ref >60)
Glucose, Bld: 187 mg/dL — ABNORMAL HIGH (ref 70–99)
Potassium: 3.2 mmol/L — ABNORMAL LOW (ref 3.5–5.1)
Sodium: 135 mmol/L (ref 135–145)

## 2021-01-01 LAB — MAGNESIUM
Magnesium: 2.8 mg/dL — ABNORMAL HIGH (ref 1.7–2.4)
Magnesium: 2.2 mg/dL (ref 1.7–2.4)

## 2021-01-01 LAB — TRIGLYCERIDES: Triglycerides: 574 mg/dL — ABNORMAL HIGH (ref <150)

## 2021-01-01 LAB — GLUCOSE, CAPILLARY
Glucose-Capillary: 221 mg/dL — ABNORMAL HIGH (ref 70–99)
Glucose-Capillary: 179 mg/dL — ABNORMAL HIGH (ref 70–99)
Glucose-Capillary: 181 mg/dL — ABNORMAL HIGH (ref 70–99)
Glucose-Capillary: 154 mg/dL — ABNORMAL HIGH (ref 70–99)
Glucose-Capillary: 158 mg/dL — ABNORMAL HIGH (ref 70–99)
Glucose-Capillary: 163 mg/dL — ABNORMAL HIGH (ref 70–99)

## 2021-01-01 LAB — LDL CHOLESTEROL, DIRECT: Direct LDL: 111.4 mg/dL — ABNORMAL HIGH (ref 0–99)

## 2021-01-01 LAB — HEMOGLOBIN A1C
Hgb A1c MFr Bld: 6.3 % — ABNORMAL HIGH (ref 4.8–5.6)
Mean Plasma Glucose: 134 mg/dL

## 2021-01-01 MED ORDER — INSULIN ASPART 100 UNIT/ML IJ SOLN
0.0000 [IU] | INTRAMUSCULAR | Status: DC
  Administered 2021-01-01 (×2): 2 [IU] via SUBCUTANEOUS
  Administered 2021-01-01: 3 [IU] via SUBCUTANEOUS
  Administered 2021-01-01 – 2021-01-02 (×5): 2 [IU] via SUBCUTANEOUS
  Administered 2021-01-02 (×2): 1 [IU] via SUBCUTANEOUS
  Administered 2021-01-02 – 2021-01-03 (×4): 2 [IU] via SUBCUTANEOUS
  Administered 2021-01-03: 1 [IU] via SUBCUTANEOUS
  Administered 2021-01-03: 2 [IU] via SUBCUTANEOUS
  Administered 2021-01-03 – 2021-01-04 (×4): 1 [IU] via SUBCUTANEOUS
  Administered 2021-01-04: 20:00:00 2 [IU] via SUBCUTANEOUS
  Administered 2021-01-04: 14:00:00 1 [IU] via SUBCUTANEOUS
  Administered 2021-01-04 – 2021-01-05 (×4): 2 [IU] via SUBCUTANEOUS
  Administered 2021-01-05: 1 [IU] via SUBCUTANEOUS
  Administered 2021-01-05: 3 [IU] via SUBCUTANEOUS
  Administered 2021-01-05 (×2): 2 [IU] via SUBCUTANEOUS
  Administered 2021-01-06 (×3): 3 [IU] via SUBCUTANEOUS
  Administered 2021-01-06 – 2021-01-07 (×4): 2 [IU] via SUBCUTANEOUS
  Administered 2021-01-07: 3 [IU] via SUBCUTANEOUS
  Administered 2021-01-07: 5 [IU] via SUBCUTANEOUS
  Administered 2021-01-07: 2 [IU] via SUBCUTANEOUS
  Administered 2021-01-07 – 2021-01-08 (×4): 3 [IU] via SUBCUTANEOUS

## 2021-01-01 MED ORDER — METOPROLOL TARTRATE 50 MG PO TABS
100.0000 mg | ORAL_TABLET | Freq: Two times a day (BID) | ORAL | Status: DC
  Administered 2021-01-01 – 2021-01-04 (×6): 100 mg
  Filled 2021-01-01 (×6): qty 2

## 2021-01-01 MED ORDER — SODIUM CHLORIDE 0.9 % IV SOLN: INTRAVENOUS | Status: DC

## 2021-01-01 MED ORDER — CLONIDINE HCL 0.2 MG PO TABS
0.2000 mg | ORAL_TABLET | Freq: Three times a day (TID) | ORAL | Status: DC
  Administered 2021-01-01 – 2021-01-02 (×3): 0.2 mg
  Filled 2021-01-01 (×3): qty 1

## 2021-01-01 MED ORDER — POLYETHYLENE GLYCOL 3350 17 G PO PACK: 17.0000 g | PACK | Freq: Every day | ORAL | Status: DC | PRN

## 2021-01-01 MED ORDER — CHLORHEXIDINE GLUCONATE CLOTH 2 % EX PADS
6.0000 | MEDICATED_PAD | Freq: Every day | CUTANEOUS | Status: DC
Start: 2021-01-01 — End: 2021-03-04
  Administered 2021-01-01 – 2021-03-04 (×60): 6 via TOPICAL

## 2021-01-01 MED ORDER — INSULIN ASPART 100 UNIT/ML IJ SOLN
0.0000 [IU] | Freq: Three times a day (TID) | INTRAMUSCULAR | Status: DC
Start: 2021-01-01 — End: 2021-01-01

## 2021-01-01 MED ORDER — POTASSIUM CHLORIDE 10 MEQ/100ML IV SOLN
10.0000 meq | INTRAVENOUS | Status: AC
  Administered 2021-01-01 (×6): 10 meq via INTRAVENOUS
  Filled 2021-01-01 (×6): qty 100

## 2021-01-01 MED ORDER — NICARDIPINE HCL IN NACL 40-0.83 MG/200ML-% IV SOLN
3.0000 mg/h | INTRAVENOUS | Status: DC
  Administered 2021-01-01 (×4): 15 mg/h via INTRAVENOUS
  Administered 2021-01-01 – 2021-01-02 (×2): 10 mg/h via INTRAVENOUS
  Administered 2021-01-02: 7.5 mg/h via INTRAVENOUS
  Administered 2021-01-02: 12.5 mg/h via INTRAVENOUS
  Administered 2021-01-02 (×3): 10 mg/h via INTRAVENOUS
  Administered 2021-01-03 (×2): 15 mg/h via INTRAVENOUS
  Administered 2021-01-04: 22:00:00 12.5 mg/h via INTRAVENOUS
  Administered 2021-01-04 (×2): 10 mg/h via INTRAVENOUS
  Administered 2021-01-04: 11:00:00 5 mg/h via INTRAVENOUS
  Administered 2021-01-05: 15 mg/h via INTRAVENOUS
  Administered 2021-01-05: 12.5 mg/h via INTRAVENOUS
  Administered 2021-01-05: 15 mg/h via INTRAVENOUS
  Administered 2021-01-05 (×2): 10 mg/h via INTRAVENOUS
  Administered 2021-01-05 – 2021-01-06 (×11): 15 mg/h via INTRAVENOUS
  Administered 2021-01-07: 12.5 mg/h via INTRAVENOUS
  Administered 2021-01-07 (×3): 15 mg/h via INTRAVENOUS
  Administered 2021-01-07 (×2): 12.5 mg/h via INTRAVENOUS
  Administered 2021-01-07 – 2021-01-08 (×4): 15 mg/h via INTRAVENOUS
  Administered 2021-01-08: 10 mg/h via INTRAVENOUS
  Administered 2021-01-08 (×2): 15 mg/h via INTRAVENOUS
  Administered 2021-01-09 (×3): 12.5 mg/h via INTRAVENOUS
  Filled 2021-01-01: qty 400
  Filled 2021-01-01 (×13): qty 200
  Filled 2021-01-01: qty 400
  Filled 2021-01-01: qty 200
  Filled 2021-01-01 (×2): qty 400
  Filled 2021-01-01 (×8): qty 200
  Filled 2021-01-01: qty 400
  Filled 2021-01-01 (×7): qty 200
  Filled 2021-01-01: qty 400
  Filled 2021-01-01 (×8): qty 200
  Filled 2021-01-01: qty 400
  Filled 2021-01-01 (×3): qty 200

## 2021-01-01 MED ORDER — FLEET ENEMA 7-19 GM/118ML RE ENEM
1.0000 | ENEMA | Freq: Once | RECTAL | Status: AC
  Administered 2021-01-01: 1 via RECTAL
  Filled 2021-01-01: qty 1

## 2021-01-01 NOTE — Progress Notes (Signed)
EEG complete - results pending 

## 2021-01-01 NOTE — Progress Notes (Addendum)
STROKE TEAM PROGRESS NOTE    INTERVAL HISTORY Patient is seen in his room with no family at the bedside.  Overnight, he has continued to require high doses of Cleviprex to maintain BP control.  He has developed snoring respirations and CCM has been consulted due to the risk of requiring intubation.  Vitals:   01/01/21 1345 01/01/21 1400 01/01/21 1415 01/01/21 1430  BP: (!) 155/74 (S) (!) 169/92 (!) 144/88 (!) 153/67  Pulse: 94 (!) 106 99 98  Resp: (!) 25 (!) 21 (!) 27 (!) 23  Temp:      TempSrc:      SpO2: 97% 99% 97% 96%  Weight:      Height:       CBC:  Recent Labs  Lab 12/30/20 1720 12/31/20 0240 01/01/21 0508  WBC 15.1* 20.0* 17.7*  NEUTROABS 13.3*  --   --   HGB 14.5 14.7 15.0  HCT 45.6 43.4 43.9  MCV 74.4* 73.9* 73.0*  PLT 243 291 258    Basic Metabolic Panel:  Recent Labs  Lab 12/31/20 0240 12/31/20 1642 01/01/21 0508  NA 137  --  135  K 3.2*  --  3.2*  CL 99  --  101  CO2 24  --  24  GLUCOSE 184*  --  187*  BUN 23*  --  33*  CREATININE 1.83*  --  2.23*  CALCIUM 9.3  --  9.2  MG  --  2.4 2.8*  PHOS  --  3.2 4.5    Lipid Panel:  Recent Labs  Lab 12/31/20 0240 01/01/21 0508  CHOL 175  --   TRIG 481* 574*  HDL 45  --   CHOLHDL 3.9  --   VLDL UNABLE TO CALCULATE IF TRIGLYCERIDE OVER 400 mg/dL  --   LDLCALC UNABLE TO CALCULATE IF TRIGLYCERIDE OVER 400 mg/dL  --     EHUD1S:  Recent Labs  Lab 12/31/20 0240  HGBA1C 6.3*   Urine Drug Screen:  Recent Labs  Lab 12/31/20 0822  LABOPIA NONE DETECTED  COCAINSCRNUR NONE DETECTED  LABBENZ NONE DETECTED  AMPHETMU NONE DETECTED  THCU NONE DETECTED  LABBARB NONE DETECTED     Alcohol Level No results for input(s): ETH in the last 168 hours.  IMAGING past 24 hours CT HEAD WO CONTRAST ( )  Result Date: 01/01/2021 CLINICAL DATA:  Intracranial hemorrhage follow up EXAM: CT HEAD WITHOUT CONTRAST TECHNIQUE: Contiguous axial images were obtained from the base of the skull through the vertex without  intravenous contrast. COMPARISON:  12/31/2020 FINDINGS: Brain: Unchanged left caudate intraparenchymal hemorrhage with intraventricular extension and moderate communicating hydrocephalus. Subarachnoid blood over both hemispheres is unchanged allowing for redistribution. No midline shift or other mass effect. Vascular: No abnormal hyperdensity of the major intracranial arteries or dural venous sinuses. No intracranial atherosclerosis. Skull: The visualized skull base, calvarium and extracranial soft tissues are normal. Sinuses/Orbits: No fluid levels or advanced mucosal thickening of the visualized paranasal sinuses. No mastoid or middle ear effusion. The orbits are normal. IMPRESSION: 1. Unchanged left caudate intraparenchymal hemorrhage with intraventricular extension and moderate communicating hydrocephalus. 2. Unchanged subarachnoid blood over both hemispheres. Electronically Signed   By: Deatra Robinson M.D.   On: 01/01/2021 01:24   CT HEAD WO CONTRAST ( )  Result Date: 12/31/2020 CLINICAL DATA:  Intracranial hemorrhage, follow-up EXAM: CT HEAD WITHOUT CONTRAST TECHNIQUE: Contiguous axial images were obtained from the base of the skull through the vertex without intravenous contrast. COMPARISON:  Earlier same day FINDINGS: Brain: As  before, there is hemorrhage involving the left caudate with significant intraventricular extension. Appearance is similar apart from some interval redistribution. There is no new hemorrhage. Remain similar enlargement of the ventricles. No new loss of gray-white differentiation. Stable findings of probable chronic microvascular ischemic changes in the cerebral white matter. Vascular: No new finding. Skull: Calvarium is unremarkable. Sinuses/Orbits: No acute finding. Other: None. IMPRESSION: Similar left caudate hemorrhage with significant intraventricular extension. No new hemorrhage or worsening mass effect. Stable probable mild hydrocephalus. Electronically Signed   By:  Macy Mis M.D.   On: 12/31/2020 16:36   DG Abd Portable 1V  Result Date: 12/31/2020 CLINICAL DATA:  Feeding tube placement EXAM: PORTABLE ABDOMEN - 1 VIEW COMPARISON:  None. FINDINGS: Feeding tube enters the stomach with the tip the gastric antrum. Distended colon compatible with ileus. IMPRESSION: Feeding tube in the gastric antrum Adynamic ileus. Electronically Signed   By: Franchot Gallo M.D.   On: 12/31/2020 19:04   VAS US CAROTID  Result Date: 01/01/2021 Carotid Arterial Duplex Study Patient Name:  Stephen Delgado [facility]  Date of Exam:   01/01/2021 Medical Rec #: [id number]       Accession #:    [id number] Date of Birth: [birth year]      Patient Gender: M Patient Age:   1 years Exam Location:  [facility] Procedure:      VAS US CAROTID Referring Phys: Rosalin Hawking --------------------------------------------------------------------------------  Indications:       CVA. Risk Factors:      Hypertension, Diabetes. Limitations        Today's exam was limited due to the patient's inability or                    unwillingness to cooperate and the patient's respiratory                    variation. Comparison Study:  no prior Performing Technologist: Archie Patten RVS  Examination Guidelines: A complete evaluation includes B-mode imaging, spectral Doppler, color Doppler, and power Doppler as needed of all accessible portions of each vessel. Bilateral testing is considered an integral part of a complete examination. Limited examinations for reoccurring indications may be performed as noted.  Right Carotid Findings: +----------+--------+--------+--------+------------------+--------+           PSV cm/sEDV cm/sStenosisPlaque DescriptionComments +----------+--------+--------+--------+------------------+--------+ CCA Prox  93      15              heterogenous               +----------+--------+--------+--------+------------------+--------+ CCA Distal98      16              heterogenous                +----------+--------+--------+--------+------------------+--------+ ICA Prox  58      12      1-39%   heterogenous               +----------+--------+--------+--------+------------------+--------+ ICA Distal61      9                                          +----------+--------+--------+--------+------------------+--------+ ECA       147     20                                         +----------+--------+--------+--------+------------------+--------+ +----------+--------+-------+--------+-------------------+  PSV cm/sEDV cmsDescribeArm Pressure (mmHG) +----------+--------+-------+--------+-------------------+ [id number]                                        +----------+--------+-------+--------+-------------------+ +---------+--------+--+--------+--+---------+ VertebralPSV cm/s60EDV cm/s11Antegrade +---------+--------+--+--------+--+---------+  Left Carotid Findings: +----------+--------+--------+--------+------------------+--------------+           PSV cm/sEDV cm/sStenosisPlaque DescriptionComments       +----------+--------+--------+--------+------------------+--------------+ CCA Prox  198     26              heterogenous                     +----------+--------+--------+--------+------------------+--------------+ CCA Distal81      12              heterogenous                     +----------+--------+--------+--------+------------------+--------------+ ICA Prox  33      7       1-39%   heterogenous                     +----------+--------+--------+--------+------------------+--------------+ ICA Distal39      10                                               +----------+--------+--------+--------+------------------+--------------+ ECA                                                 Not visualized +----------+--------+--------+--------+------------------+--------------+  +----------+--------+--------+--------+-------------------+           PSV cm/sEDV cm/sDescribeArm Pressure (mmHG) +----------+--------+--------+--------+-------------------+ [id number]                                         +----------+--------+--------+--------+-------------------+ +---------+--------+--------+--------------+ VertebralPSV cm/sEDV cm/sNot identified +---------+--------+--------+--------------+   Summary: Right Carotid: Velocities in the right ICA are consistent with a 1-39% stenosis. Left Carotid: Velocities in the left ICA are consistent with a 1-39% stenosis. Vertebrals: Right vertebral artery demonstrates antegrade flow. Left vertebral             artery was not visualized. *See table(s) above for measurements and observations.     Preliminary     PHYSICAL EXAM General:  Patient is a well-developed, well-nourished male in no acute distress  Neurological:  Patient does not respond to name and is unable to answer questions or follow commands.  PERRL. Incomplete oculocephalic reflex.  RUE and RLE nonresponsive to noxious stimuli, LUE and LLE flicker to noxious stimuli.  Respiratory:  Snoring respirations noted, lungs CTA  GI: Abdomen soft with active bowel sounds    ASSESSMENT/PLAN Mr. CHIRON UMSTED is a 55 y.o. male with history of HTN (questionable compliance with medications), DM and CKD 3 presenting with headache and altered mental status. 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  He was hypertensive on arrival to 238/160 and was given Cleviprex for BP control.  He was also agitated and required restraints.  Repeat head CT 11/30 shows unchanged IPH with IVH  Head CT 12/1 also demonstrates unchanged IPH with IVH.  Patient has developed snoring respirations overnight and is not able to travel to MRI today due to his respiratory status.  CCM was consulted to aid in management of this.    ICH score 2  ICH with IVH:  left  basal ganglia ICH with IVH likely secondary to hypertensive emergency CT head Large basal ganglia IPH with IVH involving the lateral, third and fourth ventricles with no significant mass effect or midline shift.  Repeat CT 11/30 0116 Unchanged IPH with IVH Repeat CT head 11/30 1629  Stable IPH with IVH, stable mild hydrocephalus 2D Echo EF [id number], grade 1 diastolic dysfunction, no atrial level shunt MRI pending- unable to travel to scanner today due to respiratory status MRA pending CUS unremarkable LDL 111.4 HgbA1c 6.3 VTE prophylaxis - SCDs aspirin 81 mg daily prior to admission, now on No antithrombotic secondary to [place] Therapy recommendations:  pending Disposition:  pending  Hypertensive emergency Home meds:  Amlodipine 10 mg daily, metoprolol succinate 25 mg daily,  Unstable Changed from Cleviprex to Cardene, still requiring high dose of Cardene to maintain BP at goal. SBP goal <160 Add home amlodipine 10 mg daily and increase metoprolol to 100 mg BID Add Clonidine 0.1->0.2 mg q8h Long-term BP goal normotensive  Hyperlipidemia Home meds:  none LDL 111.4, goal < 70 Hold off statin for now due to acute ICH Consider statin at discharge  Other Stroke Risk Factors Questionable compliance with medications at home  Other Active Problems Hypokalemia 3.2 -> supplement Low grade fever - CXR, UA neg. Tmax 100.5 - close monitoring Ileus seen on KUB- Patient has not had a BM since admission, will give Fleet enema  Hospital day # [place] , MSN, AGACNP-BC [facility] See Amion for schedule and pager information 01/01/2021 3:46 PM   ATTENDING NOTE: I reviewed above note and agree with the assessment and plan. Pt was seen and examined.   No family at bedside.  Patient continued stuporous, more sonorous, repeat CT this morning no acute change.  No hydrocephalus.  Given increased respiratory distress, will consult CCM.  Still on maximized dose of  Cleviprex, elevated triglycerides, will change to Cardene, will also increase metoprolol to 100 mg twice daily and clonidine to 0.2 mg every 8 hours.  KUB showed adynamic ileus, will have Fleet enema.  Hold off MRI for now given respite distress.  Stat CT if acute neuro changes.  For detailed assessment and plan, please refer to above as I have made changes wherever appropriate.   Rosalin Hawking, MD PhD Stroke Neurology 01/01/2021 8:36 PM  This patient is critically ill due to [place] with IVH, hypertensive emergency, respiratory distress and at significant risk of neurological worsening, death form hematoma expansion, cerebral edema, hydrocephalus, respiratory failure, encephalopathy. This patient's care requires constant monitoring of vital signs, hemodynamics, respiratory and cardiac monitoring, review of multiple databases, neurological assessment, discussion with family, other specialists and medical decision making of high complexity. I spent 40 minutes of neurocritical care time in the care of this patient.    To contact Stroke Continuity provider, please refer to [email or web address] After hours, contact General Neurology

## 2021-01-01 NOTE — Procedures (Signed)
Patient Name: Stephen Delgado  MRN: [id number]  Epilepsy Attending: Charlsie Quest  Referring Physician/Provider: Dr Marvel Plan Date: 01/01/2021 Duration: 19.32 mins  Patient history: 55 year old male with ICH and IVH.  EEG to evaluate for seizure.  Level of alertness:  lethargic   AEDs during EEG study: None  Technical aspects: This EEG study was done with scalp electrodes positioned according to the 10-20 International system of electrode placement. Electrical activity was acquired at a sampling rate of 500Hz  and reviewed with a high frequency filter of 70Hz  and a low frequency filter of 1Hz . EEG data were recorded continuously and digitally stored.   Description: EEG showed continuous generalized 3 to 6 Hz theta-delta slowing.  Hyperventilation and photic stimulation were not performed.     Of note, EEG was technically difficult due to significant EKG artifact.  ABNORMALITY - Continuous slow, generalized  IMPRESSION: This technically difficult study is suggestive of moderate diffuse encephalopathy, nonspecific etiology. No seizures or epileptiform discharges were seen throughout the recording.  Norrin Shreffler 

## 2021-01-01 NOTE — Consult Note (Signed)
NAME:  Stephen Delgado, MRN:  [id number], DOB:  1965/12/16, LOS: 2 ADMISSION DATE:  12/30/2020, CONSULTATION DATE:  01/01/21 REFERRING MD:  xu (stroke), CHIEF COMPLAINT:  respiratory failure   History of Present Illness:  55yo male with hx HTN, DM, CKD3 admitted 11/29 with HA, nausea, AMS. CT showed L ICH with IVH likely r/t HTN emergency. He was admitted by stroke team, repeat CT stable. Has continued to have difficulty with BP control.  On 12/1 had worsening HTN with BP 206/152 despite cleviprex, metoprolol, PRN labetalol and worsening respiratory status with questionable airway protection. PCCM consulted.   Pertinent  Medical History   has a past medical history of Allergy, Diabetes, and Hypertension.   Significant Hospital Events: Including procedures, antibiotic start and stop dates in addition to other pertinent events   CT head 11/29>>> 1. Left basal ganglia intraparenchymal hemorrhage with extension of a large amount of blood into the ventricular system. No significant mass effect or midline shift. 2. Moderate chronic microvascular ischemic changes. CT head 12/1>>1. Unchanged left caudate intraparenchymal hemorrhage with intraventricular extension and moderate communicating hydrocephalus. 2. Unchanged subarachnoid blood over both hemispheres.  Interim History / Subjective:  Poor mental status, difficult to control BP on cleviprex. Currently protecting airway.   Objective   Blood pressure (!) 144/108, pulse 83, temperature 99.4 F (37.4 C), temperature source Axillary, resp. rate 17, height 5\' 8"  (1.727 m), weight 85.2 kg, SpO2 95 %.        Intake/Output Summary (Last 24 hours) at 01/01/2021 1109 Last data filed at 01/01/2021 1100 Gross per 24 hour  Intake 1981.9 ml  Output 725 ml  Net 1256.9 ml   Filed Weights   12/31/20 1435  Weight: 85.2 kg    Examination: General: acutely ill appearing male HENT: mm dry, no JVD Lungs: resps even, non labored, snoring (nasal),  protecting airway currently, slight tachypnea Cardiovascular: s1s2 rrr Abdomen: soft, non tender  Extremities: warm and dry, no edema  Neuro: groans to pain, moves L arm spontaneously   Resolved Hospital Problem list     Assessment & Plan:  ICH with IVH - CT head with large basal ganglia IPH with IVH involving lateral 3rd and 4th ventricles. No sig mass effect or shift. MRI pending  PLAN -  BP control as below  Stroke team primary  MRI/MRA pending once more clinically stable to travel   HTN emergency  PLAN -  SBP goal <160 Transition to cardene gtt  PRN labetalol  Continue Per tube metoprolol  F/u chem   Tenuous respiratory status r/t ICH PLAN -  F/u CXR  Supplemental O2  Monitor closely - low threshold to intubate for airway protection    Best Practice (right click and "Reselect all SmartList Selections" daily)   Diet/type: NPO DVT prophylaxis: SCD GI prophylaxis: N/A Lines: N/A Foley:  Yes, and it is still needed Code Status:  full code Last date of multidisciplinary goals of care discussion [11/29]  Labs   CBC: Recent Labs  Lab 12/30/20 1720 12/31/20 0240 01/01/21 0508  WBC 15.1* 20.0* 17.7*  NEUTROABS 13.3*  --   --   HGB 14.5 14.7 15.0  HCT 45.6 43.4 43.9  MCV 74.4* 73.9* 73.0*  PLT 243 291 258    Basic Metabolic Panel: Recent Labs  Lab 12/30/20 1720 12/31/20 0240 12/31/20 1642 01/01/21 0508  NA 139 137  --  135  K 3.2* 3.2*  --  3.2*  CL 99 99  --  101  CO2 28 24  --  24  GLUCOSE 149* 184*  --  187*  BUN 24* 23*  --  33*  CREATININE 1.71* 1.83*  --  2.23*  CALCIUM 9.5 9.3  --  9.2  MG  --   --  2.4 2.8*  PHOS  --   --  3.2 4.5   GFR: Estimated Creatinine Clearance: 38.8 mL/min (A) (by C-G formula based on SCr of 2.23 mg/dL (H)). Recent Labs  Lab 12/30/20 1720 12/31/20 0240 01/01/21 0508  WBC 15.1* 20.0* 17.7*    Liver Function Tests: Recent Labs  Lab 12/30/20 1730 12/30/20 2250  AST 26 34  ALT 26 25  ALKPHOS 60 60   BILITOT 0.8 0.7  PROT 8.2* 8.3*  ALBUMIN 4.2 4.2   No results for input(s): LIPASE, AMYLASE in the last 168 hours. No results for input(s): AMMONIA in the last 168 hours.  ABG No results found for: PHART, PCO2ART, PO2ART, HCO3, TCO2, ACIDBASEDEF, O2SAT   Coagulation Profile: Recent Labs  Lab 12/31/20 0240  INR 1.1    Cardiac Enzymes: No results for input(s): CKTOTAL, CKMB, CKMBINDEX, TROPONINI in the last 168 hours.  HbA1C: Hemoglobin A1C  Date/Time Value Ref Range Status  08/18/2017 03:42 PM 5.7 (A) 4.0 - 5.6 % Final    Comment:    CBG 134  06/10/2016 02:58 PM 6.4  Final   Hgb A1c MFr Bld  Date/Time Value Ref Range Status  12/31/2020 02:40 AM 6.3 (H) 4.8 - 5.6 % Final    Comment:    (NOTE)         Prediabetes: 5.7 - 6.4         Diabetes: >6.4         Glycemic control for adults with diabetes: <7.0   04/19/2015 06:22 PM 6.0 (H) 4.8 - 5.6 % Final    Comment:    (NOTE)         Pre-diabetes: 5.7 - 6.4         Diabetes: >6.4         Glycemic control for adults with diabetes: <7.0     CBG: Recent Labs  Lab 12/31/20 1550 12/31/20 1930 12/31/20 2324 01/01/21 0309 01/01/21 0745  GLUCAP 192* 198* 181* 221* 179*    Review of Systems:   As per HPI - All other systems reviewed and were neg.    Past Medical History:  He,  has a past medical history of Allergy, Diabetes, and Hypertension.   Surgical History:   Past Surgical History:  Procedure Laterality Date   NO PAST SURGERIES       Social History:   reports that he has never smoked. He has never used smokeless tobacco. He reports current alcohol use. He reports that he does not use drugs.   Family History:  His family history includes Stroke in his paternal uncle.   Allergies No Known Allergies   Home Medications  Prior to Admission medications   Medication Sig Start Date End Date Taking? Authorizing Provider  amLODipine (NORVASC) 10 MG tablet Take 1 tablet (10 mg total) by mouth daily.  05/25/18  Yes Isabelle Course, MD  aspirin 81 MG EC tablet Take 81 mg by mouth daily. Swallow whole.   Yes [provider]  metoprolol succinate (TOPROL-XL) 25 MG 24 hr tablet TAKE 1 TABLET BY MOUTH ONCE A DAY 02/06/20 02/05/21 Yes Hong, Greggory Brandy, MD  zolpidem (AMBIEN) 10 MG tablet TAKE 1 TABLET BY MOUTH NIGHTLY AT BEDTIME AS  NEEDED FOR SLEEP Patient not taking: Reported on 12/30/2020 02/06/20 08/04/20  Luna Fuse, MD     Critical care time: 89mins     Katy Tamer Baughman, NP Pulmonary/Critical Care Medicine  01/01/2021  11:09 AM

## 2021-01-01 NOTE — Progress Notes (Signed)
PT Cancellation Note  Patient Details Name: Stephen Delgado MRN: [id number] DOB: January 12, 1966   Cancelled Treatment:    Reason Eval/Treat Not Completed: Active bedrest order;Patient not medically ready  Reeves Musick A. Dan Humphreys PT, DPT Acute Rehabilitation Services Pager [phone] Office [phone]    Viviann Spare 01/01/2021, 8:32 AM

## 2021-01-01 NOTE — Progress Notes (Signed)
Carotid duplex has been completed.   Preliminary results in CV Proc.   Stephen Delgado 01/01/2021 9:24 AM

## 2021-01-01 NOTE — Progress Notes (Signed)
Paged neuro MD about potassium 3.2 this morning. New verbal order for 6 runs of IVPB potassium. Triglycerides elevated this morning also, 574. Keeping patient on cleviprex for now, will discuss switching to another gtt on day shift.

## 2021-01-01 NOTE — Progress Notes (Signed)
SLP Cancellation Note  Patient Details Name: Stephen Delgado MRN: [id number] DOB: 04/12/1965   Cancelled treatment:        RN in with pt who is not appropriately responsive for PO trials presently. Will continue to follow.   Royce Macadamia 01/01/2021, 9:21 AM

## 2021-01-01 NOTE — Progress Notes (Signed)
OT Cancellation Note  Patient Details Name: Stephen Delgado MRN: [id number] DOB: Nov 03, 1965   Cancelled Treatment:    Reason Eval/Treat Not Completed: Active bedrest order;Patient not medically ready  [facility] Luisa Dago, OT/L   Acute OT Clinical Specialist Acute Rehabilitation Services Pager [phone] Office [phone]  01/01/2021, 8:22 AM

## 2021-01-02 DIAGNOSIS — J988 Other specified respiratory disorders: Secondary | ICD-10-CM | POA: Diagnosis not present

## 2021-01-02 DIAGNOSIS — I610 Nontraumatic intracerebral hemorrhage in hemisphere, subcortical: Secondary | ICD-10-CM | POA: Diagnosis not present

## 2021-01-02 DIAGNOSIS — I161 Hypertensive emergency: Secondary | ICD-10-CM | POA: Diagnosis not present

## 2021-01-02 DIAGNOSIS — I615 Nontraumatic intracerebral hemorrhage, intraventricular: Secondary | ICD-10-CM | POA: Diagnosis not present

## 2021-01-02 LAB — CBC
HCT: 40.1 % (ref 39.0–52.0)
Hemoglobin: 12.8 g/dL — ABNORMAL LOW (ref 13.0–17.0)
MCH: 23.5 pg — ABNORMAL LOW (ref 26.0–34.0)
MCHC: 31.9 g/dL (ref 30.0–36.0)
MCV: 73.7 fL — ABNORMAL LOW (ref 80.0–100.0)
Platelets: 244 10*3/uL (ref 150–400)
RBC: 5.44 MIL/uL (ref 4.22–5.81)
RDW: 15.1 % (ref 11.5–15.5)
WBC: 14.9 10*3/uL — ABNORMAL HIGH (ref 4.0–10.5)
nRBC: 0 % (ref 0.0–0.2)

## 2021-01-02 LAB — GLUCOSE, CAPILLARY
Glucose-Capillary: 173 mg/dL — ABNORMAL HIGH (ref 70–99)
Glucose-Capillary: 156 mg/dL — ABNORMAL HIGH (ref 70–99)
Glucose-Capillary: 139 mg/dL — ABNORMAL HIGH (ref 70–99)
Glucose-Capillary: 130 mg/dL — ABNORMAL HIGH (ref 70–99)
Glucose-Capillary: 177 mg/dL — ABNORMAL HIGH (ref 70–99)
Glucose-Capillary: 155 mg/dL — ABNORMAL HIGH (ref 70–99)

## 2021-01-02 LAB — BASIC METABOLIC PANEL
Anion gap: 11 (ref 5–15)
BUN: 40 mg/dL — ABNORMAL HIGH (ref 6–20)
CO2: 20 mmol/L — ABNORMAL LOW (ref 22–32)
Calcium: 8.6 mg/dL — ABNORMAL LOW (ref 8.9–10.3)
Chloride: 107 mmol/L (ref 98–111)
Creatinine, Ser: 2.46 mg/dL — ABNORMAL HIGH (ref 0.61–1.24)
GFR, Estimated: 30 mL/min — ABNORMAL LOW (ref >60)
Glucose, Bld: 165 mg/dL — ABNORMAL HIGH (ref 70–99)
Potassium: 3.1 mmol/L — ABNORMAL LOW (ref 3.5–5.1)
Sodium: 138 mmol/L (ref 135–145)

## 2021-01-02 LAB — MAGNESIUM: Magnesium: 2.4 mg/dL (ref 1.7–2.4)

## 2021-01-02 LAB — PHOSPHORUS: Phosphorus: 6.3 mg/dL — ABNORMAL HIGH (ref 2.5–4.6)

## 2021-01-02 MED ORDER — OSMOLITE 1.5 CAL PO LIQD
1000.0000 mL | ORAL | Status: DC
Start: 2021-01-02 — End: 2021-01-05
  Administered 2021-01-02 – 2021-01-04 (×2): 1000 mL

## 2021-01-02 MED ORDER — POLYETHYLENE GLYCOL 3350 17 G PO PACK: 17.0000 g | PACK | Freq: Every day | ORAL | Status: DC

## 2021-01-02 MED ORDER — POTASSIUM CHLORIDE 20 MEQ PO PACK
40.0000 meq | PACK | Freq: Three times a day (TID) | ORAL | Status: AC
  Administered 2021-01-02 (×2): 40 meq
  Filled 2021-01-02 (×2): qty 2

## 2021-01-02 MED ORDER — POLYETHYLENE GLYCOL 3350 17 G PO PACK
17.0000 g | PACK | Freq: Every day | ORAL | Status: DC | PRN
  Administered 2021-01-02: 17 g

## 2021-01-02 MED ORDER — POLYETHYLENE GLYCOL 3350 17 G PO PACK
17.0000 g | PACK | Freq: Every day | ORAL | Status: DC
  Filled 2021-01-02: qty 1

## 2021-01-02 MED ORDER — CLONIDINE HCL 0.2 MG PO TABS
0.3000 mg | ORAL_TABLET | Freq: Three times a day (TID) | ORAL | Status: DC
  Administered 2021-01-02 – 2021-01-12 (×29): 0.3 mg
  Filled 2021-01-02 (×30): qty 1

## 2021-01-02 MED ORDER — DOCUSATE SODIUM 50 MG/5ML PO LIQD: 100.0000 mg | Freq: Every day | ORAL | Status: DC | PRN

## 2021-01-02 NOTE — Progress Notes (Signed)
Nutrition Follow-up  DOCUMENTATION CODES:   Not applicable  INTERVENTION:   Initiate tube feeding via Cortrak tube: Osmolite 1.5 @ 20 ml/hr - trickle  If tolerates recommend goal:  Osmolite 1.5 @ 60 ml/hr (1440 ml/day) ProSource TF 45 ml daily   Tube feeding regimen provides 2200 kcal, 101 grams of protein, and 1097 ml of H2O.   NUTRITION DIAGNOSIS:   Inadequate oral intake related to lethargy/confusion, dysphagia as evidenced by NPO status. Ongoing.   GOAL:   Patient will meet greater than or equal to 90% of their needs Not met.   MONITOR:   Diet advancement, Labs, Weight trends  REASON FOR ASSESSMENT:   Ventilator, Consult Enteral/tube feeding initiation and management  ASSESSMENT:   55 year old male who presented to the ED on 11/29 with AMS. PMH of HTN, T2DM, CKD stage III. 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  Pt discussed during ICU rounds and with RN.  Per RN after cortrak placement abd xray showed large bowel ileus, TF not started.  Spoke with CCM, ok to try trickles and MD to add scheduled miralax.  Respiratory status still tenuous.   11/30 cortrak placed; tip gastric  Medications reviewed and include: SSI, protonix, senokot-s  Cardene Labs reviewed: K 3.1, PO4: 6.3     Diet Order:   Diet Order             Diet NPO time specified  Diet effective now                   EDUCATION NEEDS:   Not appropriate for education at this time  Skin:  Skin Assessment: Reviewed RN Assessment  Last BM:  unknown  Height:   Ht Readings from Last 1 Encounters:  12/31/20 '5\' 8"'  (1.727 m)    Weight:   Wt Readings from Last 1 Encounters:  12/31/20 85.2 kg   BMI:  Body mass index is 28.56 kg/m.  Estimated Nutritional Needs:   Kcal:  2000-2200  Protein:  100-115 grams  Fluid:  >/= 2.0 L  Darriel Utter P., RD, LDN, CNSC See AMiON for contact information

## 2021-01-02 NOTE — Consult Note (Signed)
NAME:  STATHAM STORMONT, MRN:  [id number], DOB:  1965/06/15, LOS: 3 ADMISSION DATE:  12/30/2020, CONSULTATION DATE:  01/01/21 REFERRING MD:  xu (stroke), CHIEF COMPLAINT:  respiratory failure   History of Present Illness:  55 yo male presented with headache, nausea and altered mental status.  CT head showed acute Lt ICH with IVH likely related to hypertensive emergency (BP 195/116).  Had persistent hypertension and concern for airway protection, and PCCM asked to assist with ICU management.  Pertinent  Medical History  HTN, DM type 2, CKD 3a  Significant Hospital Events: Including procedures, antibiotic start and stop dates in addition to other pertinent events   11/29 Admit, start cleviprex 12/01 PCCM consulted; changed from cleviprex to cardene due to elevated triglycerides  Interim History / Subjective:  Non verbal.  Objective   Blood pressure (!) 149/78, pulse 90, temperature 99 F (37.2 C), temperature source Oral, resp. rate (!) 21, height 5\' 8"  (1.727 m), weight 85.2 kg, SpO2 96 %.        Intake/Output Summary (Last 24 hours) at 01/02/2021 1017 Last data filed at 01/02/2021 0900 Gross per 24 hour  Intake 3363.58 ml  Output 750 ml  Net 2613.58 ml   Filed Weights   12/31/20 1435  Weight: 85.2 kg    Examination:  General - sitting upright in bed, snoring Eyes - pupils reactive ENT - cortrak in place Cardiac - regular rate/rhythm, no murmur Chest - equal breath sounds b/l, no wheezing or rales Abdomen - soft, non tender, + bowel sounds Extremities - no cyanosis, clubbing, or edema Skin - no rashes Neuro - not following commands  Resolved Hospital Problem list     Assessment & Plan:   Lt BG ICH with IVH 2nd to HTN emergency. - monitor neuro status - MRI brain when stable to travel  HTN emergency. - wean cardene for goal SBP < 160 - continue norvasc, catapres, lopressor  Compromised airway. - monitor need for intubation in ICU  CKD3a. Hypokalemia. -  replace electrolytes - f/u BMET  DM type 2. - SSI  Microcytic anemia. - f/u CBC - check iron levels  Dypshagia. - continue tube feeds  Best Practice (right click and "Reselect all SmartList Selections" daily)   Diet/type: NPO DVT prophylaxis: SCD GI prophylaxis: N/A Lines: N/A Foley:  Yes, and it is still needed Code Status:  full code Last date of multidisciplinary goals of care discussion [11/29]  Labs    CMP Latest Ref Rng & Units 01/02/2021 01/01/2021 12/31/2020  Glucose 70 - 99 mg/dL 165(H) 187(H) 184(H)  BUN 6 - 20 mg/dL 40(H) 33(H) 23(H)  Creatinine 0.61 - 1.24 mg/dL 2.46(H) 2.23(H) 1.83(H)  Sodium 135 - 145 mmol/L 138 135 137  Potassium 3.5 - 5.1 mmol/L 3.1(L) 3.2(L) 3.2(L)  Chloride 98 - 111 mmol/L 107 101 99  CO2 22 - 32 mmol/L 20(L) 24 24  Calcium 8.9 - 10.3 mg/dL 8.6(L) 9.2 9.3  Total Protein 6.5 - 8.1 g/dL - - -  Total Bilirubin 0.3 - 1.2 mg/dL - - -  Alkaline Phos 38 - 126 U/L - - -  AST 15 - 41 U/L - - -  ALT 0 - 44 U/L - - -    CBC Latest Ref Rng & Units 01/02/2021 01/01/2021 12/31/2020  WBC 4.0 - 10.5 K/uL 14.9(H) 17.7(H) 20.0(H)  Hemoglobin 13.0 - 17.0 g/dL 12.8(L) 15.0 14.7  Hematocrit 39.0 - 52.0 % 40.1 43.9 43.4  Platelets 150 - 400 K/uL 244 258 291  CBG (last 3)  Recent Labs    01/01/21 2326 01/02/21 0332 01/02/21 0737  GLUCAP 163* 173* 156*   Critical care time: 34 minutes  Coralyn Helling, MD [facility] Pulmonary/Critical Care Pager - [phone] 01/02/2021, 10:31 AM

## 2021-01-02 NOTE — Progress Notes (Addendum)
STROKE TEAM PROGRESS NOTE   INTERVAL HISTORY Patient is seen in his room with no family at the bedside.  Switched to Cardene given triglycerides. Increase clonidine to 0.3mg  to maintain BP control.  He has a nasal trumpet in for NT suctioning. CCM remains on board for possible intubation. No changes in neurological status.  Vitals:   01/02/21 0730 01/02/21 0745 01/02/21 0800 01/02/21 0815  BP: (!) 154/80 (!) 154/82 (!) 157/85 139/82  Pulse: 88 94 87 86  Resp: (!) 25 (!) 24 (!) 21 (!) 23  Temp:      TempSrc:      SpO2: 97% 97% 95% 96%  Weight:      Height:       CBC:  Recent Labs  Lab 12/30/20 1720 12/31/20 0240 01/01/21 0508 01/02/21 0448  WBC 15.1*   < > 17.7* 14.9*  NEUTROABS 13.3*  --   --   --   HGB 14.5   < > 15.0 12.8*  HCT 45.6   < > 43.9 40.1  MCV 74.4*   < > 73.0* 73.7*  PLT 243   < > 258 244   < > = values in this interval not displayed.    Basic Metabolic Panel:  Recent Labs  Lab 01/01/21 0508 01/01/21 1653 01/02/21 0448  NA 135  --  138  K 3.2*  --  3.1*  CL 101  --  107  CO2 24  --  20*  GLUCOSE 187*  --  165*  BUN 33*  --  40*  CREATININE 2.23*  --  2.46*  CALCIUM 9.2  --  8.6*  MG 2.8* 2.2 2.4  PHOS 4.5 4.4 6.3*    Lipid Panel:  Recent Labs  Lab 12/31/20 0240 01/01/21 0508  CHOL 175  --   TRIG 481* 574*  HDL 45  --   CHOLHDL 3.9  --   VLDL UNABLE TO CALCULATE IF TRIGLYCERIDE OVER 400 mg/dL  --   LDLCALC UNABLE TO CALCULATE IF TRIGLYCERIDE OVER 400 mg/dL  --     HgbA1c:  Recent Labs  Lab 12/31/20 0240  HGBA1C 6.3*    Urine Drug Screen:  Recent Labs  Lab 12/31/20 0822  LABOPIA NONE DETECTED  COCAINSCRNUR NONE DETECTED  LABBENZ NONE DETECTED  AMPHETMU NONE DETECTED  THCU NONE DETECTED  LABBARB NONE DETECTED     Alcohol Level No results for input(s): ETH in the last 168 hours.  IMAGING past 24 hours DG CHEST PORT 1 VIEW  Result Date: 01/01/2021 CLINICAL DATA:  Respiratory failure. EXAM: PORTABLE CHEST 1 VIEW COMPARISON:   Chest x-ray 12/31/2020. FINDINGS: Enteric tube extends below the diaphragm with distal tip near the gastric antrum. The cardiomediastinal silhouette appears prominent in size similar to the prior study. There is no lung consolidation, pleural effusion or pneumothorax. No acute fractures are seen. IMPRESSION: 1. Cardiomegaly. 2. No evidence for pneumonia or edema. 3. Enteric tube tip projects over the gastric antrum. Electronically Signed   By: Stephen Delgado M.D.   On: 01/01/2021 21:39   EEG adult  Result Date: 01/01/2021 Stephen Havens, MD     01/01/2021  4:31 PM Patient Name: Stephen Delgado MRN: [id number] Epilepsy Attending: Lora Delgado Referring Physician/Provider: Dr Rosalin Hawking Date: 01/01/2021 Duration: 19.32 mins Patient history: 55 year old male with ICH and IVH.  EEG to evaluate for seizure. Level of alertness:  lethargic AEDs during EEG study: None Technical aspects: This EEG study was done with scalp electrodes positioned according  to the 10-20 International system of electrode placement. Electrical activity was acquired at a sampling rate of 500Hz  and reviewed with a high frequency filter of 70Hz  and a low frequency filter of 1Hz . EEG data were recorded continuously and digitally stored. Description: EEG showed continuous generalized 3 to 6 Hz theta-delta slowing.  Hyperventilation and photic stimulation were not performed.   Of note, EEG was technically difficult due to significant EKG artifact. ABNORMALITY - Continuous slow, generalized IMPRESSION: This technically difficult study is suggestive of moderate diffuse encephalopathy, nonspecific etiology. No seizures or epileptiform discharges were seen throughout the recording. Stephen Delgado   VAS CAROTID  Result Date: 01/01/2021 Carotid Arterial Duplex Study Patient Name:  Stephen Delgado [facility]  Date of Exam:   01/01/2021 Medical Rec #: Stephen Delgado       Accession #:    [facility] Date of Birth: 1965-03-15      Patient Gender: M Patient Age:    57 years Exam Location:  [facility] Procedure:      VAS 11/23/1965 CAROTID Referring Phys: 56 [name] --------------------------------------------------------------------------------  Indications:       CVA. Risk Factors:      Hypertension, Diabetes. Limitations        Today's exam was limited due to the patient's inability or                    unwillingness to cooperate and the patient's respiratory                    variation. Comparison Study:  no prior Performing Technologist: [facility]  Examination Guidelines: A complete evaluation includes B-mode imaging, spectral Doppler, color Doppler, and power Doppler as needed of all accessible portions of each vessel. Bilateral testing is considered an integral part of a complete examination. Limited examinations for reoccurring indications may be performed as noted.  Right Carotid Findings: +----------+--------+--------+--------+------------------+--------+           PSV cm/sEDV cm/sStenosisPlaque DescriptionComments +----------+--------+--------+--------+------------------+--------+ CCA Prox  93      15              heterogenous               +----------+--------+--------+--------+------------------+--------+ CCA Distal98      16              heterogenous               +----------+--------+--------+--------+------------------+--------+ ICA Prox  58      12      1-39%   heterogenous               +----------+--------+--------+--------+------------------+--------+ ICA Distal61      9                                          +----------+--------+--------+--------+------------------+--------+ ECA       147     20                                         +----------+--------+--------+--------+------------------+--------+ +----------+--------+-------+--------+-------------------+           PSV cm/sEDV cmsDescribeArm Pressure (mmHG) +----------+--------+-------+--------+-------------------+ [place]                                         +----------+--------+-------+--------+-------------------+ +---------+--------+--+--------+--+---------+  VertebralPSV cm/s60EDV cm/s11Antegrade +---------+--------+--+--------+--+---------+  Left Carotid Findings: +----------+--------+--------+--------+------------------+--------------+           PSV cm/sEDV cm/sStenosisPlaque DescriptionComments       +----------+--------+--------+--------+------------------+--------------+ CCA Prox  198     26              heterogenous                     +----------+--------+--------+--------+------------------+--------------+ CCA Distal81      12              heterogenous                     +----------+--------+--------+--------+------------------+--------------+ ICA Prox  33      7       1-39%   heterogenous                     +----------+--------+--------+--------+------------------+--------------+ ICA Distal39      10                                               +----------+--------+--------+--------+------------------+--------------+ ECA                                                 Not visualized +----------+--------+--------+--------+------------------+--------------+ +----------+--------+--------+--------+-------------------+           PSV cm/sEDV cm/sDescribeArm Pressure (mmHG) +----------+--------+--------+--------+-------------------+ [id number]                                         +----------+--------+--------+--------+-------------------+ +---------+--------+--------+--------------+ VertebralPSV cm/sEDV cm/sNot identified +---------+--------+--------+--------------+   Summary: Right Carotid: Velocities in the right ICA are consistent with a 1-39% stenosis. Left Carotid: Velocities in the left ICA are consistent with a 1-39% stenosis. Vertebrals: Right vertebral artery demonstrates antegrade flow. Left vertebral             artery was not visualized. *See  table(s) above for measurements and observations.     Preliminary     PHYSICAL EXAM General:  Patient is a well-developed, well-nourished male in no acute distress  Neurological:  Patient does not respond to name and is unable to answer questions or follow commands.  PERRL. Incomplete oculocephalic reflex.  RUE and RLE nonresponsive to noxious stimuli, LUE and LLE flicker to noxious stimuli.  Respiratory:  Snoring respirations noted, lungs CTA  GI: Abdomen soft with active bowel sounds    ASSESSMENT/PLAN Stephen Delgado is a 55 y.o. male with history of HTN (questionable compliance with medications), DM and CKD 3 presenting with headache and altered mental status. 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  He was hypertensive on arrival to 238/160 and was given Cleviprex for BP control.  He was also agitated and required restraints.  Repeat head CT 11/30 shows unchanged IPH with IVH  Head CT 12/1 also demonstrates unchanged IPH with IVH.  Patient has developed snoring respirations overnight and is not able to travel to MRI today due to his respiratory status.  CCM was consulted to aid in management of this.    ICH score 2  ICH  with IVH:  left basal ganglia ICH with IVH likely secondary to hypertensive emergency CT head Large basal ganglia IPH with IVH involving the lateral, third and fourth ventricles with no significant mass effect or midline shift.  Repeat CT 11/30 0116 Unchanged IPH with IVH Repeat CT head 11/30 1629  Stable IPH with IVH, stable mild hydrocephalus 2D Echo EF [id number], grade 1 diastolic dysfunction, no atrial level shunt Consider MRI and MRA once stable to rule out aneurysm or AVM Repeat CT in am CUS unremarkable LDL 111.4 HgbA1c 6.3 VTE prophylaxis - SCDs aspirin 81 mg daily prior to admission, now on No antithrombotic secondary to [place] Therapy recommendations:  SNF Disposition:  pending  Respiratory distress More sonorus over  time CCM on board On nasal trumpet Intubation if needed   Hypertensive emergency Home meds:  Amlodipine 10 mg daily, metoprolol succinate 25 mg daily,  Unstable Cardene, still requiring high dose of Cardene to maintain BP at goal. SBP goal <160 Add home amlodipine 10 mg daily and i metoprolol to 100 mg BID Increase Clonidine 0.3 mg q8h Long-term BP goal normotensive  Hyperlipidemia Home meds:  none LDL 111.4, goal < 70 Hold off statin for now due to acute ICH Consider statin at discharge  AKI on CKD Cre 1.83-2.23-2.46 On IVF and TF Close monitoring I/O CCM on board  Other Stroke Risk Factors Questionable compliance with medications at home  Other Active Problems Hypokalemia 3.1 -> supplement Ileus seen on KUB- fleet enema give 12/1; bowel regimen in place  Hospital day # 3  [place], [place] [facility] [phone]   ATTENDING NOTE: I reviewed above note and agree with the assessment and plan. Pt was seen and examined.   No family at bedside.  Patient still has tachypnea and respiratory distress, CCM on board, on nasal trumpet, still has intermittent tachycardia.  EEG showed moderate diffuse encephalopathy, no seizure.  BP still elevated, on Cardene drip, increase p.o. BP meds of clonidine to 0.3 every 8 hours, continue amlodipine 10 and metoprolol 100 twice daily.  Taper off Cleviprex as able.  Neuro unchanged, will repeat CT in a.m.  Not able to have MRI and MRA done due to respiratory distress, not able to lay flat.  Creatinine continue to elevate 2.46 today from yesterday 2.23.  Continue IV fluid until feeding, close monitoring creatinine and input/output.  For detailed assessment and plan, please refer to above as I have made changes wherever appropriate.   Rosalin Hawking, MD PhD Stroke Neurology 01/02/2021 7:58 PM  This patient is critically ill due to [place], IVH, respiratory distress, AKI on CKD and at significant risk of neurological worsening,  death form hematoma expansion, hydrocephalus, respirate failure, renal failure, seizure. This patient's care requires constant monitoring of vital signs, hemodynamics, respiratory and cardiac monitoring, review of multiple databases, neurological assessment, discussion with family, other specialists and medical decision making of high complexity. I spent 35 minutes of neurocritical care time in the care of this patient.   To contact Stroke Continuity provider, please refer to [email or web address] After hours, contact General Neurology

## 2021-01-02 NOTE — Progress Notes (Signed)
Occupational Therapy Evaluation  Pt lethargic with decreased level of arousal with noxious stimuli. Opened eyes briefly and made moaning sounds at times. No command follow. Will follow up next week to assess progress and ability to benefit from post acute rehab.    01/02/21 1300  OT Visit Information  Last OT Received On 01/02/21  Assistance Needed +2  PT/OT/SLP Co-Evaluation/Treatment Yes  Reason for Co-Treatment Complexity of the patient's impairments (multi-system involvement)  OT goals addressed during session ADL's and self-care  Reason Eval/Treat Not Completed Active bedrest order;Patient not medically ready  History of Present Illness 55 y/o presented to [facility] ED on 11/29 for AMS, lethargy, and vomiting. Transferred to [facility]. CTH showed L caudate/BG hemorrhage with IVH. Declining respiratory status with snoring respirations. PMH: HTN, sleep apnea, DM, CKD 3  Precautions  Precautions Fall  Precaution Comments cortrak, nasal trumpet, mittens  Home Living  Family/patient expects to be discharged to: Unsure  Additional Comments unable to obtain information due to lethargy/decreased level of arousal (per chart review ptfrom home)  Prior Function  Prior Level of Function  Patient poor historian/Family not available  Communication  Communication Other (comment) (decreased level of arousal)  Pain Assessment  Faces Pain Scale 0  Cognition  Arousal/Alertness Lethargic  Behavior During Therapy Flat affect  Overall Cognitive Status Difficult to assess  General Comments eyes closed entire session. Unable to arouse with sternal rub and other noxious stimuli.  Difficult to assess due to Level of arousal  Upper Extremity Assessment  Upper Extremity Assessment  (no active movement observed to command; PROM [facility])  Cervical / Trunk Assessment  Cervical / Trunk Assessment  (positions with head/neck minimally extended)  Vision- Assessment  Additional Comments when eyes opended; L gaze  preference; no blink to threat  Bed Mobility  General bed mobility comments deferred due to level of arousal  Transfers  General transfer comment not attempted  General Comments  General comments (skin integrity, edema, etc.) VSS on RA; smoring type respirations when minimally aroused  OT - End of Session  Activity Tolerance Patient limited by lethargy  Patient left in bed;with call bell/phone within reach;with bed alarm set;with restraints reapplied  Nurse Communication Mobility status  OT Assessment  OT Recommendation/Assessment Patient needs continued OT Services (will follow up)  OT Visit Diagnosis Other abnormalities of gait and mobility (R26.89);Muscle weakness (generalized) (M62.81);Low vision, both eyes (H54.2);Other symptoms and signs involving the nervous system (R29.898);Other symptoms and signs involving cognitive function;Cognitive communication deficit (R41.841)  Symptoms and signs involving cognitive functions Nontraumatic SAH;Other Nontraumatic ICH  OT Problem List Decreased strength;Decreased range of motion;Decreased activity tolerance;Impaired balance (sitting and/or standing);Impaired vision/perception;Decreased coordination;Decreased cognition;Decreased safety awareness;Decreased knowledge of use of DME or AE;Cardiopulmonary status limiting activity;Impaired sensation;Impaired tone  OT Plan  OT Frequency (ACUTE ONLY) Min 1X/week (pending progress)  OT Treatment/Interventions (ACUTE ONLY) Self-care/ADL training;Therapeutic exercise;Neuromuscular education;DME and/or AE instruction;Therapeutic activities;Cognitive remediation/compensation;Splinting;Visual/perceptual remediation/compensation;Patient/family education;Balance training  AM-PAC OT "6 Clicks" Daily Activity Outcome Measure (Version 2)  Help from another person eating meals? 1  Help from another person taking care of personal grooming? 1  Help from another person toileting, which includes using toliet, bedpan, or  urinal? 1  Help from another person bathing (including washing, rinsing, drying)? 1  Help from another person to put on and taking off regular upper body clothing? 1  Help from another person to put on and taking off regular lower body clothing? 1  6 Click Score 6  Progressive Mobility  What is the highest level  of mobility based on the progressive mobility assessment? Level 1 (Bedfast) - Unable to balance while sitting on edge of bed  Mobility Sit up in bed/chair position for meals  OT Recommendation  Recommendations for Other Services Other (comment)  Follow Up Recommendations Skilled nursing-short term rehab (<3 hours/day) (pending progress)  Assistance recommended at discharge Frequent or constant Supervision/Assistance  Functional Status Assessent Patient has had a recent decline in their functional status and/or demonstrates limited ability to make significant improvements in function in a reasonable and predictable amount of time  OT Equipment Other (comment) (TBA)  Individuals Consulted  Consulted and Agree with Results and Recommendations Patient unable/family or caregiver not available  Acute Rehab OT Goals  Patient Stated Goal unablet o state  OT Goal Formulation Patient unable to participate in goal setting  Time For Goal Achievement 01/16/21  Potential to Achieve Goals Fair  OT Time Calculation  OT Start Time (ACUTE ONLY) 1048  OT Stop Time (ACUTE ONLY) 1104  OT Time Calculation (min) 16 min  OT General Charges  $OT Visit 1 Visit  OT Evaluation  $OT Eval Moderate Complexity 1 Mod  Krystyne Tewksbury, OT/L   Acute OT Clinical Specialist Acute Rehabilitation Services Pager [phone] Office [phone]

## 2021-01-02 NOTE — Progress Notes (Signed)
Marginal renal function. Did NOT replace K this AM.

## 2021-01-02 NOTE — Evaluation (Signed)
Physical Therapy Evaluation Patient Details Name: Stephen Delgado MRN: [id number] DOB: 1965-07-01 Today's Date: 01/02/2021  History of Present Illness  55 y/o presented to [facility] ED on 11/29 for AMS, lethargy, and vomiting. Transferred to [facility]. CTH showed L caudate/BG hemorrhage with IVH. Declining respiratory status with snoring respirations. PMH: HTN, sleep apnea, DM, CKD 3  Clinical Impression  Patient admitted with above diagnosis. Evaluation limited by lethargy and decreased level of arousal. Patient with snoring respirations but improved with repositioning of head forward. Patient with eyes closed throughout session and not able to arouse with noxious stimuli. Patient will benefit from skilled PT services during acute stay to address listed deficits. Recommend SNF at discharge to maximize functional mobility and safety.   Will continue to assess as cognition improves.     Recommendations for follow up therapy are one component of a multi-disciplinary discharge planning process, led by the attending physician.  Recommendations may be updated based on patient status, additional functional criteria and insurance authorization.  Follow Up Recommendations Skilled nursing-short term rehab (<3 hours/day)    Assistance Recommended at Discharge Frequent or constant Supervision/Assistance  Functional Status Assessment Patient has had a recent decline in their functional status and/or demonstrates limited ability to make significant improvements in function in a reasonable and predictable amount of time  Equipment Recommendations  Other (comment) (TBD)    Recommendations for Other Services       Precautions / Restrictions Precautions Precautions: Fall Precaution Comments: cortrak, nasal trumpet, mittens Restrictions Weight Bearing Restrictions: No      Mobility  Bed Mobility               General bed mobility comments: deferred due to level of arousal    Transfers                         Ambulation/Gait                  Stairs            Wheelchair Mobility    Modified Rankin (Stroke Patients Only) Modified Rankin (Stroke Patients Only) Pre-Morbid Rankin Score: No significant disability Modified Rankin: Severe disability     Balance                                             Pertinent Vitals/Pain Pain Assessment: CPOT Facial Expression: Relaxed, neutral Body Movements: Absence of movements Muscle Tension: Relaxed Compliance with ventilator (intubated pts.): N/A Vocalization (extubated pts.): Talking in normal tone or no sound CPOT Total: 0 Pain Intervention(s): Monitored during session    Home Living Family/patient expects to be discharged to:: Unsure                   Additional Comments: unable to obtain information due to lethargy/decreased level of arousal    Prior Function Prior Level of Function : Patient poor historian/Family not available                     Hand Dominance        Extremity/Trunk Assessment   Upper Extremity Assessment Upper Extremity Assessment: Defer to OT evaluation    Lower Extremity Assessment Lower Extremity Assessment: Difficult to assess due to impaired cognition (no spontaneous movement - PROM [facility])       Communication   Communication:  Other (comment) (decreased level of arousal)  Cognition Arousal/Alertness: Lethargic Behavior During Therapy: Flat affect Overall Cognitive Status: Difficult to assess                                 General Comments: eyes closed entire session. Unable to arouse with sternal rub and other noxious stimuli.        General Comments General comments (skin integrity, edema, etc.): VSS on RA    Exercises     Assessment/Plan    PT Assessment Patient needs continued PT services  PT Problem List Decreased strength;Decreased activity tolerance;Decreased balance;Decreased coordination;Decreased  mobility;Decreased cognition;Decreased safety awareness;Decreased knowledge of precautions;Cardiopulmonary status limiting activity       PT Treatment Interventions DME instruction;Functional mobility training;Therapeutic activities;Therapeutic exercise;Balance training;Patient/family education;Wheelchair mobility training    PT Goals (Current goals can be found in the Care Plan section)  Acute Rehab PT Goals Patient Stated Goal: did not state PT Goal Formulation: Patient unable to participate in goal setting Time For Goal Achievement: 01/16/21 Potential to Achieve Goals: Fair    Frequency Min 1X/week (until level of arousal improves)   Barriers to discharge        Co-evaluation PT/OT/SLP Co-Evaluation/Treatment: Yes Reason for Co-Treatment: Necessary to address cognition/behavior during functional activity;For patient/therapist safety PT goals addressed during session: Strengthening/ROM         AM-PAC PT "6 Clicks" Mobility  Outcome Measure Help needed turning from your back to your side while in a flat bed without using bedrails?: Total Help needed moving from lying on your back to sitting on the side of a flat bed without using bedrails?: Total Help needed moving to and from a bed to a chair (including a wheelchair)?: Total Help needed standing up from a chair using your arms (e.g., wheelchair or bedside chair)?: Total Help needed to walk in hospital room?: Total Help needed climbing 3-5 steps with a railing? : Total 6 Click Score: 6    End of Session   Activity Tolerance: Patient limited by lethargy Patient left: in bed;with call bell/phone within reach;with restraints reapplied Nurse Communication: Mobility status PT Visit Diagnosis: Muscle weakness (generalized) (M62.81);Other symptoms and signs involving the nervous system [id number])    Time: [id number] PT Time Calculation (min) (ACUTE ONLY): 16 min   Charges:   PT Evaluation $PT Eval Moderate Complexity: 1  Mod          Carlie Solorzano A. Gilford Rile PT, DPT Acute Rehabilitation Services Pager [phone] Office [phone]   Linna Hoff 01/02/2021, 12:02 PM

## 2021-01-02 NOTE — Progress Notes (Signed)
SLP Cancellation Note  Patient Details Name: Stephen Delgado MRN: [id number] DOB: 10/29/1965   Cancelled treatment:       Reason Eval/Treat Not Completed: Patient not medically ready- D/W RN.  SLP will follow along.  [name], MA CCC/SLP Acute Rehabilitation Services Office number [phone] Pager [phone]    [name] 01/02/2021, 9:48 AM

## 2021-01-03 ENCOUNTER — Inpatient Hospital Stay (HOSPITAL_COMMUNITY): Payer: 59

## 2021-01-03 DIAGNOSIS — J690 Pneumonitis due to inhalation of food and vomit: Secondary | ICD-10-CM | POA: Diagnosis not present

## 2021-01-03 DIAGNOSIS — I610 Nontraumatic intracerebral hemorrhage in hemisphere, subcortical: Secondary | ICD-10-CM | POA: Diagnosis not present

## 2021-01-03 DIAGNOSIS — J988 Other specified respiratory disorders: Secondary | ICD-10-CM | POA: Diagnosis not present

## 2021-01-03 LAB — IRON AND TIBC
Iron: 34 ug/dL — ABNORMAL LOW (ref 45–182)
Saturation Ratios: 12 % — ABNORMAL LOW (ref 17.9–39.5)
TIBC: 293 ug/dL (ref 250–450)
UIBC: 259 ug/dL

## 2021-01-03 LAB — BASIC METABOLIC PANEL
Anion gap: 11 (ref 5–15)
BUN: 46 mg/dL — ABNORMAL HIGH (ref 6–20)
CO2: 19 mmol/L — ABNORMAL LOW (ref 22–32)
Calcium: 8.5 mg/dL — ABNORMAL LOW (ref 8.9–10.3)
Chloride: 114 mmol/L — ABNORMAL HIGH (ref 98–111)
Creatinine, Ser: 2.06 mg/dL — ABNORMAL HIGH (ref 0.61–1.24)
GFR, Estimated: 37 mL/min — ABNORMAL LOW (ref >60)
Glucose, Bld: 183 mg/dL — ABNORMAL HIGH (ref 70–99)
Potassium: 3.6 mmol/L (ref 3.5–5.1)
Sodium: 144 mmol/L (ref 135–145)

## 2021-01-03 LAB — CBC
HCT: 41.1 % (ref 39.0–52.0)
Hemoglobin: 13.3 g/dL (ref 13.0–17.0)
MCH: 23.7 pg — ABNORMAL LOW (ref 26.0–34.0)
MCHC: 32.4 g/dL (ref 30.0–36.0)
MCV: 73.3 fL — ABNORMAL LOW (ref 80.0–100.0)
Platelets: 282 10*3/uL (ref 150–400)
RBC: 5.61 MIL/uL (ref 4.22–5.81)
RDW: 15.3 % (ref 11.5–15.5)
WBC: 15 10*3/uL — ABNORMAL HIGH (ref 4.0–10.5)
nRBC: 0 % (ref 0.0–0.2)

## 2021-01-03 LAB — GLUCOSE, CAPILLARY
Glucose-Capillary: 154 mg/dL — ABNORMAL HIGH (ref 70–99)
Glucose-Capillary: 157 mg/dL — ABNORMAL HIGH (ref 70–99)
Glucose-Capillary: 153 mg/dL — ABNORMAL HIGH (ref 70–99)
Glucose-Capillary: 136 mg/dL — ABNORMAL HIGH (ref 70–99)
Glucose-Capillary: 150 mg/dL — ABNORMAL HIGH (ref 70–99)
Glucose-Capillary: 149 mg/dL — ABNORMAL HIGH (ref 70–99)

## 2021-01-03 LAB — POCT I-STAT 7, (LYTES, BLD GAS, ICA,H+H)
Acid-base deficit: 4 mmol/L — ABNORMAL HIGH (ref 0.0–2.0)
Bicarbonate: 20 mmol/L (ref 20.0–28.0)
Calcium, Ion: 1.2 mmol/L (ref 1.15–1.40)
HCT: 34 % — ABNORMAL LOW (ref 39.0–52.0)
Hemoglobin: 11.6 g/dL — ABNORMAL LOW (ref 13.0–17.0)
O2 Saturation: 97 %
Potassium: 3.6 mmol/L (ref 3.5–5.1)
Sodium: 147 mmol/L — ABNORMAL HIGH (ref 135–145)
TCO2: 21 mmol/L — ABNORMAL LOW (ref 22–32)
pCO2 arterial: 31.2 mmHg — ABNORMAL LOW (ref 32.0–48.0)
pH, Arterial: 7.416 (ref 7.350–7.450)
pO2, Arterial: 91 mmHg (ref 83.0–108.0)

## 2021-01-03 LAB — FERRITIN: Ferritin: 231 ng/mL (ref 24–336)

## 2021-01-03 LAB — MAGNESIUM: Magnesium: 2.4 mg/dL (ref 1.7–2.4)

## 2021-01-03 MED ORDER — ROCURONIUM BROMIDE 10 MG/ML (PF) SYRINGE
PREFILLED_SYRINGE | INTRAVENOUS | Status: AC
  Administered 2021-01-03: 70 mg
  Filled 2021-01-03: qty 10

## 2021-01-03 MED ORDER — FENTANYL CITRATE PF 50 MCG/ML IJ SOSY
PREFILLED_SYRINGE | INTRAMUSCULAR | Status: AC
  Administered 2021-01-03: 50 ug
  Filled 2021-01-03: qty 2

## 2021-01-03 MED ORDER — MIDAZOLAM HCL 2 MG/2ML IJ SOLN
2.0000 mg | INTRAMUSCULAR | Status: DC | PRN
  Administered 2021-01-04 (×2): 2 mg via INTRAVENOUS
  Filled 2021-01-03 (×2): qty 2

## 2021-01-03 MED ORDER — ORAL CARE MOUTH RINSE: 15.0000 mL | OROMUCOSAL | Status: DC

## 2021-01-03 MED ORDER — PIPERACILLIN-TAZOBACTAM 3.375 G IVPB
3.3750 g | Freq: Three times a day (TID) | INTRAVENOUS | Status: DC
  Administered 2021-01-03 – 2021-01-05 (×7): 3.375 g via INTRAVENOUS
  Filled 2021-01-03 (×7): qty 50

## 2021-01-03 MED ORDER — POLYETHYLENE GLYCOL 3350 17 G PO PACK
17.0000 g | PACK | Freq: Every day | ORAL | Status: DC
  Administered 2021-01-03 – 2021-01-07 (×5): 17 g
  Filled 2021-01-03 (×6): qty 1

## 2021-01-03 MED ORDER — DOCUSATE SODIUM 50 MG/5ML PO LIQD
100.0000 mg | Freq: Two times a day (BID) | ORAL | Status: DC
  Administered 2021-01-03 – 2021-01-07 (×9): 100 mg
  Filled 2021-01-03 (×11): qty 10

## 2021-01-03 MED ORDER — PROPOFOL 1000 MG/100ML IV EMUL: 0.0000 ug/kg/min | INTRAVENOUS | Status: DC

## 2021-01-03 MED ORDER — ORAL CARE MOUTH RINSE
15.0000 mL | OROMUCOSAL | Status: DC
  Administered 2021-01-03 – 2021-01-17 (×141): 15 mL via OROMUCOSAL

## 2021-01-03 MED ORDER — FENTANYL CITRATE PF 50 MCG/ML IJ SOSY
50.0000 ug | PREFILLED_SYRINGE | INTRAMUSCULAR | Status: DC | PRN
  Administered 2021-01-03: 50 ug via INTRAVENOUS
  Administered 2021-01-04 – 2021-01-06 (×14): 100 ug via INTRAVENOUS
  Administered 2021-01-06: 50 ug via INTRAVENOUS
  Administered 2021-01-06 (×2): 100 ug via INTRAVENOUS
  Administered 2021-01-07: 50 ug via INTRAVENOUS
  Administered 2021-01-07 – 2021-01-08 (×2): 100 ug via INTRAVENOUS
  Filled 2021-01-03 (×2): qty 2
  Filled 2021-01-03: qty 1
  Filled 2021-01-03: qty 4
  Filled 2021-01-03 (×11): qty 2
  Filled 2021-01-03: qty 1
  Filled 2021-01-03 (×5): qty 2

## 2021-01-03 MED ORDER — ETOMIDATE 2 MG/ML IV SOLN
INTRAVENOUS | Status: AC
  Administered 2021-01-03: 10 mg
  Filled 2021-01-03: qty 10

## 2021-01-03 MED ORDER — BISACODYL 10 MG RE SUPP
10.0000 mg | Freq: Once | RECTAL | Status: AC
  Administered 2021-01-03: 10 mg via RECTAL
  Filled 2021-01-03: qty 1

## 2021-01-03 MED ORDER — CHLORHEXIDINE GLUCONATE 0.12% ORAL RINSE (MEDLINE KIT)
15.0000 mL | Freq: Two times a day (BID) | OROMUCOSAL | Status: DC
  Administered 2021-01-03 – 2021-01-17 (×28): 15 mL via OROMUCOSAL

## 2021-01-03 MED ORDER — MIDAZOLAM HCL 2 MG/2ML IJ SOLN
INTRAMUSCULAR | Status: AC
  Administered 2021-01-03: 2 mg
  Filled 2021-01-03: qty 4

## 2021-01-03 MED ORDER — CHLORHEXIDINE GLUCONATE 0.12% ORAL RINSE (MEDLINE KIT): 15.0000 mL | Freq: Two times a day (BID) | OROMUCOSAL | Status: DC

## 2021-01-03 MED ORDER — DEXMEDETOMIDINE HCL IN NACL 400 MCG/100ML IV SOLN
0.0000 ug/kg/h | INTRAVENOUS | Status: DC
  Administered 2021-01-03 (×2): 0.4 ug/kg/h via INTRAVENOUS
  Administered 2021-01-03: 0.3 ug/kg/h via INTRAVENOUS
  Filled 2021-01-03 (×4): qty 100

## 2021-01-03 NOTE — Progress Notes (Signed)
MRI scheduled for 1400.

## 2021-01-03 NOTE — Progress Notes (Signed)
RT transported patient to CT and back to 4N without any complications. 

## 2021-01-03 NOTE — Progress Notes (Signed)
eLink Physician-Brief Progress Note Patient Name: Stephen Delgado DOB: Jul 14, 1965 MRN: [id number]   Date of Service  01/03/2021  HPI/Events of Note  Patient with what appears to be a neurogenic breathing pattern, this appears to be his baseline over the past 24 hours.  eICU Interventions  Will defer head CT to 7 Am (which is in 3 hours) when there will hopefully be more personnel resources available in radiology in case of acute decompensation if his respiratory status.        Thomasene Lot Eyoel Throgmorton 01/03/2021, 3:33 AM

## 2021-01-03 NOTE — Progress Notes (Signed)
For RSI:   2 versed - 0743 50 fent - 0744 10 etomidate - 0744 50 fent - 0745 70 rocuronium - 0745  Intubated with no complications. VSS.    Sherral Hammers, RN

## 2021-01-03 NOTE — Procedures (Signed)
Intubation Procedure Note  Stephen Delgado  [id number]  [birth year]  Date:01/03/21  Time:7:51 AM   Provider Performing:Kaveri Perras    Procedure: Intubation (31500)  Indication(s) Respiratory Failure  Consent Unable to obtain consent due to emergent nature of procedure.   Anesthesia Etomidate, Versed, Fentanyl, and Rocuronium   Time Out Verified patient identification, verified procedure, site/side was marked, verified correct patient position, special equipment/implants available, medications/allergies/relevant history reviewed, required imaging and test results available.   Sterile Technique Usual hand hygeine, masks, and gloves were used   Procedure Description Patient positioned in bed supine.  Sedation given as noted above.  Patient was intubated with endotracheal tube using Glidescope.  View was Grade 1 full glottis .  Number of attempts was 1.  Colorimetric CO2 detector was consistent with tracheal placement.  Inserted size 8 ETT to 24 cm at lip.   Complications/Tolerance None; patient tolerated the procedure well. Chest X-ray is ordered to verify placement.   Coralyn Helling, MD [facility] Pulmonary/Critical Care Pager - [phone] 01/03/2021, 7:52 AM

## 2021-01-03 NOTE — Progress Notes (Signed)
I was called into patient room by RT due to what looks like tube feed in patient's ETT and in back of throat. VSS. No increased WOB and SpO2 100%.  Tube feed immediately stopped and Dr. Craige Cotta (CCM) notified. Will follow up with further orders.  Sherral Hammers RN

## 2021-01-03 NOTE — Progress Notes (Signed)
Head CT ordered by Dr. Roda Shutters, if unable to lay flat it is ok to hold off on the CT.   Throughout the night patient has had episodes of tachycardia into 130s and tachypnea into 50s. Starting around 0300 pt has been sustaining HR 100-120s, RR 25-30s and appears to have an increased work of breathing. Per Dr. Warrick Parisian defer head CT until more staff is available in radiology, I spoke with CT they said more staff will be available after 0700.

## 2021-01-03 NOTE — Progress Notes (Signed)
Patient RR 30-45, SpO2 88-92%, and HR 105-120. Patient also obvious using accessory muscles to breath. Snoring-like respirations. Nasal trumpet in place. After getting report from night shift RN, Karin Golden, I agree with her in that patient is currently unable to tolerate laying flat in CT scan. Will forward this information to stroke rounding team when they come around. Will continue to monitor patient closely.  Sherral Hammers, RN

## 2021-01-03 NOTE — Progress Notes (Signed)
   01/03/21 0756  Vent Select  $ Ventilator Initial/Subsequent  Initial  $ Intubation  Yes  Invasive or Noninvasive Invasive  Adult Vent Y  Vent start date 01/03/21  Vent start time 0757  Airway 8 mm  Placement Date/Time: 01/03/21 0800   Placed By: ICU physician  Airway Device: Endotracheal Tube  Laryngoscope Blade: 4  ETT Types: Oral  Size (mm): 8 mm  Cuffed: Cuffed  Insertion attempts: 1  Airway Equipment: Stylet;Video Laryngoscope  Placement Con...  Secured at (cm) 24 cm  Measured From Lips  Secured Location Right  Secured By Publishing rights manager  Prone position No  Cuff Pressure (cm H2O) Green OR 18-26 CmH2O  Adult IBW/VT Calculations  Height 6' (1.829 m)  IBW/kg (Calculated) Male 77.6 kg  Low Range Vt 6cc/kg MALE 465.6 mL  Adult Moderate Range Vt 8cc/kg MA 620.8 mL  Adult High Range Vt 10cc/kg MALE 776 mL  IBW/kg (Calculated) MALE 73.1 kg  Low Range Vt 6cc/kg MALE 438.6 mL  Adult Moderate Range vt 8cc/kg MALE 584.8 mL  Adult High Range Vt 10cc/kg MALE 731 mL  Adult Ventilator Settings  Vent Type Servo U  Humidity HME  Vent Mode PRVC  Vt Set 620 mL  Set Rate 18 bmp  FiO2 (%) 60 %  I Time 0.9 Sec(s)  PEEP 5 cmH20  Adult Ventilator Measurements  Peak Airway Pressure 19 L/min  Mean Airway Pressure 9 cmH20  Resp Rate Spontaneous 18 br/min  Resp Rate Total 18 br/min  Exhaled Vt 597 mL  Measured Ve 11.2 mL  Auto PEEP 0 cmH20  Total PEEP 5 cmH20  Adult Ventilator Alarms  Alarms On Y  Ve High Alarm 24 L/min  Ve Low Alarm 4 L/min  Resp Rate High Alarm 35 br/min  Resp Rate Low Alarm 8  PEEP Low Alarm 3 cmH2O  Press High Alarm 45 cmH2O  Breath Sounds  Bilateral Breath Sounds Diminished   Post intubation vent check

## 2021-01-03 NOTE — Progress Notes (Signed)
Pharmacy Antibiotic Note  Stephen Delgado is a 55 y.o. male admitted on 12/30/2020 with headache, nausea and altered mental status.  CT head showed acute Lt ICH with IVH likely related to hypertensive emergency.  Overnight, patient developed respiratory failure. Pharmacy has been consulted for Zosyn dosing for concerns of aspiration pneumonia.  Plan: Zosyn 3.375g Q8H F/u respiratory cultures and clinical improvement  Height: 6' (182.9 cm) Weight: 85.2 kg (187 lb 13.3 oz) (bedscale) IBW/kg (Calculated) : 77.6  Temp (24hrs), Avg:99.5 F (37.5 C), Min:98 F (36.7 C), Max:101.7 F (38.7 C)  Recent Labs  Lab 12/30/20 1720 12/31/20 0240 01/01/21 0508 01/02/21 0448 01/03/21 0458  WBC 15.1* 20.0* 17.7* 14.9* 15.0*  CREATININE 1.71* 1.83* 2.23* 2.46* 2.06*    Estimated Creatinine Clearance: 43.4 mL/min (A) (by C-G formula based on SCr of 2.06 mg/dL (H)).    No Known Allergies   Microbiology results: 12/03 Resp Cx: pending  11/30 MRSA PCR: negative   Thank you for allowing pharmacy to be a part of this patient's care.  Thelma Barge, PharmD Clinical Pharmacist

## 2021-01-03 NOTE — Progress Notes (Addendum)
STROKE TEAM PROGRESS NOTE   INTERVAL HISTORY Patient is seen in his room with no family at the bedside.  Patient was intubated around 8am by CCM. Plan for MRI around 1400 now that airway is protected. Patient remains on nicardipine for blood pressure control. Febrile over night, respiratory culture sent and abx started by CCM, appreciate assistance in management.    Vitals:   01/03/21 1000 01/03/21 1100 01/03/21 1200 01/03/21 1229  BP: 126/73 130/76 130/80   Pulse: 78 72 69   Resp: 18 18 18    Temp:   (!) 101.1 F (38.4 C)   TempSrc:   Axillary   SpO2: 100% 100% 100% 99%  Weight:      Height:       CBC:  Recent Labs  Lab 12/30/20 1720 12/31/20 0240 01/02/21 0448 01/03/21 0458 01/03/21 0857  WBC 15.1*   < > 14.9* 15.0*  --   NEUTROABS 13.3*  --   --   --   --   HGB 14.5   < > 12.8* 13.3 11.6*  HCT 45.6   < > 40.1 41.1 34.0*  MCV 74.4*   < > 73.7* 73.3*  --   PLT 243   < > 244 282  --    < > = values in this interval not displayed.    Basic Metabolic Panel:  Recent Labs  Lab 01/01/21 1653 01/02/21 0448 01/03/21 0458 01/03/21 0857  NA  --  138 144 147*  K  --  3.1* 3.6 3.6  CL  --  107 114*  --   CO2  --  20* 19*  --   GLUCOSE  --  165* 183*  --   BUN  --  40* 46*  --   CREATININE  --  2.46* 2.06*  --   CALCIUM  --  8.6* 8.5*  --   MG 2.2 2.4 2.4  --   PHOS 4.4 6.3*  --   --     Lipid Panel:  Recent Labs  Lab 12/31/20 0240 01/01/21 0508  CHOL 175  --   TRIG 481* 574*  HDL 45  --   CHOLHDL 3.9  --   VLDL UNABLE TO CALCULATE IF TRIGLYCERIDE OVER 400 mg/dL  --   LDLCALC UNABLE TO CALCULATE IF TRIGLYCERIDE OVER 400 mg/dL  --     GQBV6X:  Recent Labs  Lab 12/31/20 0240  HGBA1C 6.3*    Urine Drug Screen:  Recent Labs  Lab 12/31/20 0822  LABOPIA NONE DETECTED  COCAINSCRNUR NONE DETECTED  LABBENZ NONE DETECTED  AMPHETMU NONE DETECTED  THCU NONE DETECTED  LABBARB NONE DETECTED     Alcohol Level No results for input(s): ETH in the last 168  hours.  IMAGING past 24 hours DG CHEST PORT 1 VIEW  Result Date: 01/03/2021 CLINICAL DATA:  55 year old male intubated. EXAM: PORTABLE CHEST 1 VIEW COMPARISON:  Portable chest 01/01/2021. FINDINGS: Portable AP semi upright view at 0819 hours. Endotracheal tube tip in place at the level of the superior clavicles. Enteric tube courses to the abdomen as before. Tip not included. Larger, improved lung volumes. Normal cardiac size and mediastinal contours. Allowing for portable technique the lungs are clear. No pneumothorax or pleural effusion. Paucity of bowel gas in the upper abdomen. No acute osseous abnormality identified. IMPRESSION: 1. Endotracheal tube tip in place at the level of the superior clavicles. Advance 1 cm for more optimal placement. 2. No acute cardiopulmonary abnormality. Electronically Signed   By: Rexene Edison  Margo Aye M.D.   On: 01/03/2021 08:48   DG Abd Portable 1V  Result Date: 01/03/2021 CLINICAL DATA:  55 year old male intubated. EXAM: PORTABLE ABDOMEN - 1 VIEW COMPARISON:  12/31/2020. FINDINGS: Portable AP supine view at 0636 hours. Enteric tube courses through the stomach terminating just to the right of midline at either the gastric antrum or duodenal bulb. Stable gas-filled but nondilated large bowel loops. No dilated small bowel. Scoliosis. No acute osseous abnormality identified. IMPRESSION: Enteric tube tip at the gastric antrum versus duodenal bulb. Advance several centimeters to ensure post pyloric placement. Electronically Signed   By: Odessa Fleming M.D.   On: 01/03/2021 08:50    PHYSICAL EXAM General:  Patient is a well-developed, well-nourished male in no acute distress. Exam limited by RSI medications Ext: no C/C/E. HEENT: intubated, NG tube. Resp: intubated.  CV: RRR.  Skin: no rashes.   Neurological:  Patient does not respond to name, noxious stimuli, or follow commands.  PERRL. Incomplete oculocephalic reflex.  RUE and RLE nonresponsive to noxious stimuli, LUE and LLE  nonresponsive to noxious stimuli.   GI: Abdomen soft with active bowel sounds    ASSESSMENT/PLAN Stephen Delgado is a 55 y.o. male with history of HTN (questionable compliance with medications), DM and CKD 3 presenting with headache and altered mental status. 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  He was hypertensive on arrival to 238/160 and was given Cleviprex for BP control.  He was also agitated and required restraints.  Repeat head CT 11/30 shows unchanged IPH with IVH  Head CT 12/1 also demonstrates unchanged IPH with IVH.    ICH score 2  ICH with IVH:  left basal ganglia ICH with IVH likely secondary to hypertensive emergency CT head Large basal ganglia IPH with IVH involving the lateral, third and fourth ventricles with no significant mass effect or midline shift.  Repeat CT 11/30 0116 Unchanged IPH with IVH Repeat CT head 11/30 1629  Stable IPH with IVH, stable mild hydrocephalus 2D Echo EF 50-55%, grade 1 diastolic dysfunction, no atrial level shunt MRI today now that he is intubated. CUS unremarkable LDL 111.4 HgbA1c 6.3 VTE prophylaxis - SCDs aspirin 81 mg daily prior to admission, now on No antithrombotic secondary to IPH Therapy recommendations:  SNF Disposition:  pending  Respiratory distress Intubated this am. CCM on board   Hypertensive emergency: improving. Home meds:  Amlodipine 10 mg daily, metoprolol succinate 25 mg daily,  Unstable Cardene, still requiring Cardene to maintain BP at goal. SBP goal <160  home amlodipine 10 mg daily and i metoprolol to 100 mg BID On Clonidine 0.3 mg q8h Long-term BP goal normotensive  Hyperlipidemia Home meds:  none LDL 111.4, goal < 70 Hold off statin for now due to acute ICH Consider statin at discharge  AKI on CKD Cre 1.83-2.23-2.46 On IVF and TF Close monitoring I/O CCM on board  Other Stroke Risk Factors Questionable compliance with medications at home  Other  Active Problems Hypokalemia 3.1 -> supplement Ileus seen on KUB- fleet enema give 12/1; bowel regimen in place  Hospital day # 4   Patient seen and examined by NP/APP with MD. MD to update note as needed.   Elmer Picker, DNP, FNP-BC [facility] Pager: [phone]  ATTENDING ATTESTATION:  Pt intubated early this morning for resp distress.  Exam consistent with sedation. MRI completed shows Numerous small acute infarcts in bilateral cerebral hemispheres. Minimal involvement of  the right superior cerebellum. MRA not completed, will reorder.   Dr. Reeves Forth evaluated pt independently, reviewed imaging, chart, labs. Discussed and formulated plan with the APP. Please see APP note above for details.      This patient is critically ill due to respiratory distress, stroke and at significant risk of neurological worsening, death form heart failure, respiratory failure, recurrent stroke, bleeding from [facility], seizure, sepsis. This patient's care requires constant monitoring of vital signs, hemodynamics, respiratory and cardiac monitoring, review of multiple databases, neurological assessment, discussion with family, other specialists and medical decision making of high complexity. I spent 35 minutes of neurocritical care time in the care of this patient.   Stephen Mccampbell,MD   To contact Stroke Continuity provider, please refer to [email or web address] After hours, contact General Neurology

## 2021-01-03 NOTE — Progress Notes (Signed)
NAME:  Stephen Delgado, MRN:  [id number], DOB:  05-28-1965, LOS: 4 ADMISSION DATE:  12/30/2020, CONSULTATION DATE:  01/01/21 REFERRING MD:  xu (stroke), CHIEF COMPLAINT:  respiratory failure   History of Present Illness:  55 yo male presented with headache, nausea and altered mental status.  CT head showed acute Lt ICH with IVH likely related to hypertensive emergency (BP 195/116).  Had persistent hypertension and concern for airway protection, and PCCM asked to assist with ICU management.  Pertinent  Medical History  HTN, DM type 2, CKD 3a, OSA  Significant Hospital Events: Including procedures, antibiotic start and stop dates in addition to other pertinent events   11/29 Admit, start cleviprex 12/01 PCCM consulted; changed from cleviprex to cardene due to elevated triglycerides 12/03 intubated  Interim History / Subjective:  Increased work of breathing, gurgling this morning.  Tm 101.7 F.  Objective   Blood pressure 132/67, pulse (!) 111, temperature (!) 101.2 F (38.4 C), temperature source Axillary, resp. rate (!) 31, height 5\' 8"  (1.727 m), weight 85.2 kg, SpO2 92 %.        Intake/Output Summary (Last 24 hours) at 01/03/2021 0755 Last data filed at 01/03/2021 0600 Gross per 24 hour  Intake 2819.5 ml  Output 1550 ml  Net 1269.5 ml   Filed Weights   12/31/20 1435  Weight: 85.2 kg    Examination:  General - using accessory muscles, paradoxical breathing pattern Eyes - injected sclera ENT - dry mucosa with crusted secretions in posterior pharynx visualized during laryngoscopy Cardiac - regular, tachycardic Chest - b/l rhonchi Abdomen - soft, non tender, decreased bowel sounds Extremities - no cyanosis, clubbing, or edema Skin - no rashes Neuro - not following commands  Resolved Hospital Problem list     Assessment & Plan:   Compromised airway. - intubated 12/03 - f/u CXR, ABG - goal SpO2 > 92%  Aspiration pneumonitis. - start zosyn on 12/03 - sputum  culture 12/03  Lt BG ICH with IVH 2nd to HTN emergency. - for repeat CT head 12/03  HTN emergency. - wean cardene for goal SBP < 160 - continue norvasc, catapres, lopressor  CKD3a. - f/u BMET  DM type 2. - SSI  Dypshagia. Ileus. - trickle tube feeds - bowel regimen  Best Practice (right click and "Reselect all SmartList Selections" daily)   Diet/type: NPO DVT prophylaxis: SCD GI prophylaxis: N/A Lines: N/A Foley:  Yes, and it is still needed Code Status:  full code Family: updated pt's son by phone on 12/03  Labs    CMP Latest Ref Rng & Units 01/03/2021 01/02/2021 01/01/2021  Glucose 70 - 99 mg/dL 14/02/2020) 245(Y) 099(I)  BUN 6 - 20 mg/dL 338(S) 50(N) 39(J)  Creatinine 0.61 - 1.24 mg/dL 67(H) 4.19(F) 7.90(W)  Sodium 135 - 145 mmol/L 144 138 135  Potassium 3.5 - 5.1 mmol/L 3.6 3.1(L) 3.2(L)  Chloride 98 - 111 mmol/L 114(H) 107 101  CO2 22 - 32 mmol/L 19(L) 20(L) 24  Calcium 8.9 - 10.3 mg/dL 4.09(B) 3.5(H) 9.2  Total Protein 6.5 - 8.1 g/dL - - -  Total Bilirubin 0.3 - 1.2 mg/dL - - -  Alkaline Phos 38 - 126 U/L - - -  AST 15 - 41 U/L - - -  ALT 0 - 44 U/L - - -    CBC Latest Ref Rng & Units 01/03/2021 01/02/2021 01/01/2021  WBC 4.0 - 10.5 K/uL 15.0(H) 14.9(H) 17.7(H)  Hemoglobin 13.0 - 17.0 g/dL 14/02/2020 12.8(L) 15.0  Hematocrit 39.0 -  52.0 % 41.1 40.1 43.9  Platelets 150 - 400 K/uL 282 244 258    CBG (last 3)  Recent Labs    01/02/21 1910 01/02/21 2322 01/03/21 0354  GLUCAP 177* 155* 154*   Critical care time independent of procedure time: 37 minutes  Chesley Mires, MD [place] Pager - [phone] 01/03/2021, 7:55 AM

## 2021-01-03 NOTE — Progress Notes (Signed)
Advanced ET tube by 2 to 26 ATL per MD order

## 2021-01-03 NOTE — Progress Notes (Signed)
Pt does not respond to pain, precedex turned off until pt became asynchronous with the vent then restarted at half, assessment remained unchanged, Dr. Wilford Corner paged, CT ordered.

## 2021-01-03 NOTE — Progress Notes (Addendum)
Poor exam off of sedation per RN Repeat CTH Will follow  -- Milon Dikes, MD Neurologist [facility] Pager: [phone]   Addendum CTH IMPRESSION: 1. Unchanged bilateral intraventricular hemorrhage and left caudate head intraparenchymal hematoma. 2. Near complete resolution of previously seen subarachnoid hemorrhage. 3. Unchanged size and configuration of the ventricles.   Stroke team to follow   -- Milon Dikes, MD Neurologist [facility] Pager: [phone]

## 2021-01-04 ENCOUNTER — Inpatient Hospital Stay (HOSPITAL_COMMUNITY): Payer: 59

## 2021-01-04 DIAGNOSIS — J690 Pneumonitis due to inhalation of food and vomit: Secondary | ICD-10-CM | POA: Diagnosis not present

## 2021-01-04 DIAGNOSIS — J988 Other specified respiratory disorders: Secondary | ICD-10-CM | POA: Diagnosis not present

## 2021-01-04 DIAGNOSIS — I610 Nontraumatic intracerebral hemorrhage in hemisphere, subcortical: Secondary | ICD-10-CM | POA: Diagnosis not present

## 2021-01-04 DIAGNOSIS — I161 Hypertensive emergency: Secondary | ICD-10-CM | POA: Diagnosis not present

## 2021-01-04 LAB — CBC
HCT: 39.4 % (ref 39.0–52.0)
Hemoglobin: 12.4 g/dL — ABNORMAL LOW (ref 13.0–17.0)
MCH: 23.8 pg — ABNORMAL LOW (ref 26.0–34.0)
MCHC: 31.5 g/dL (ref 30.0–36.0)
MCV: 75.6 fL — ABNORMAL LOW (ref 80.0–100.0)
Platelets: 207 10*3/uL (ref 150–400)
RBC: 5.21 MIL/uL (ref 4.22–5.81)
RDW: 15.5 % (ref 11.5–15.5)
WBC: 14.8 10*3/uL — ABNORMAL HIGH (ref 4.0–10.5)
nRBC: 0 % (ref 0.0–0.2)

## 2021-01-04 LAB — GLUCOSE, CAPILLARY
Glucose-Capillary: 149 mg/dL — ABNORMAL HIGH (ref 70–99)
Glucose-Capillary: 147 mg/dL — ABNORMAL HIGH (ref 70–99)
Glucose-Capillary: 148 mg/dL — ABNORMAL HIGH (ref 70–99)
Glucose-Capillary: 165 mg/dL — ABNORMAL HIGH (ref 70–99)
Glucose-Capillary: 167 mg/dL — ABNORMAL HIGH (ref 70–99)
Glucose-Capillary: 196 mg/dL — ABNORMAL HIGH (ref 70–99)

## 2021-01-04 LAB — BASIC METABOLIC PANEL
Anion gap: 7 (ref 5–15)
BUN: 47 mg/dL — ABNORMAL HIGH (ref 6–20)
CO2: 21 mmol/L — ABNORMAL LOW (ref 22–32)
Calcium: 8.6 mg/dL — ABNORMAL LOW (ref 8.9–10.3)
Chloride: 120 mmol/L — ABNORMAL HIGH (ref 98–111)
Creatinine, Ser: 2.1 mg/dL — ABNORMAL HIGH (ref 0.61–1.24)
GFR, Estimated: 36 mL/min — ABNORMAL LOW (ref >60)
Glucose, Bld: 160 mg/dL — ABNORMAL HIGH (ref 70–99)
Potassium: 3.8 mmol/L (ref 3.5–5.1)
Sodium: 148 mmol/L — ABNORMAL HIGH (ref 135–145)

## 2021-01-04 MED ORDER — ETOMIDATE 2 MG/ML IV SOLN
INTRAVENOUS | Status: AC
  Filled 2021-01-04: qty 20

## 2021-01-04 MED ORDER — ROCURONIUM BROMIDE 10 MG/ML (PF) SYRINGE
PREFILLED_SYRINGE | INTRAVENOUS | Status: AC
  Filled 2021-01-04: qty 10

## 2021-01-04 MED ORDER — SUCCINYLCHOLINE CHLORIDE 200 MG/10ML IV SOSY
PREFILLED_SYRINGE | INTRAVENOUS | Status: AC
  Filled 2021-01-04: qty 10

## 2021-01-04 MED ORDER — ONDANSETRON HCL 4 MG/2ML IJ SOLN
4.0000 mg | Freq: Four times a day (QID) | INTRAMUSCULAR | Status: DC | PRN
  Administered 2021-03-20 (×2): 4 mg via INTRAVENOUS
  Filled 2021-01-04 (×3): qty 2

## 2021-01-04 MED ORDER — FENTANYL CITRATE PF 50 MCG/ML IJ SOSY
PREFILLED_SYRINGE | INTRAMUSCULAR | Status: AC
  Administered 2021-01-04: 12:00:00 50 ug
  Filled 2021-01-04: qty 2

## 2021-01-04 MED ORDER — MIDAZOLAM HCL 2 MG/2ML IJ SOLN
INTRAMUSCULAR | Status: AC
  Filled 2021-01-04: qty 2

## 2021-01-04 NOTE — Progress Notes (Signed)
Tube exchange by Dr Denese Killings. 50 mcg fent given as sedation.

## 2021-01-04 NOTE — Progress Notes (Signed)
Came by to followup. His RR was elevated. Discussed with PA to call CCM. Found to have leak in tube, now corrected and he his RR is back to normal. He is sedated now due to the procedure and exam is no possible.

## 2021-01-04 NOTE — Progress Notes (Addendum)
STROKE TEAM PROGRESS NOTE   INTERVAL HISTORY Patient is seen in his room with no family at the bedside. Patient had repeat CT over night and MRA this morning. Patient remains on nicardipine for blood pressure control. Tachypnic on the ventilator despite PRN medications and precedex. Febrile over night, respiratory culture sent and abx started by CCM, appreciate assistance in management. Poor neurological status overall. Patient does not respond to voice or noxious stimuli.   ETT cuff leak noted, tube exchanged by CCM. Respiratory rate normalized after exchange. Precedex drip turned off because of bradycardia. Family updated by MD today.  Vitals:   01/04/21 0945 01/04/21 1000 01/04/21 1015 01/04/21 1030  BP: (!) 141/79  138/82 139/82  Pulse: (!) 59 63 (!) 58 65  Resp: (!) 0 (!) 32 (!) 36 (!) 33  Temp:      TempSrc:      SpO2: 100% 100% 100% 100%  Weight:      Height:       CBC:  Recent Labs  Lab 12/30/20 1720 12/31/20 0240 01/03/21 0458 01/03/21 0857 01/04/21 0439  WBC 15.1*   < > 15.0*  --  14.8*  NEUTROABS 13.3*  --   --   --   --   HGB 14.5   < > 13.3 11.6* 12.4*  HCT 45.6   < > 41.1 34.0* 39.4  MCV 74.4*   < > 73.3*  --  75.6*  PLT 243   < > 282  --  207   < > = values in this interval not displayed.    Basic Metabolic Panel:  Recent Labs  Lab 01/01/21 1653 01/02/21 0448 01/03/21 0458 01/03/21 0857 01/04/21 0439  NA  --  138 144 147* 148*  K  --  3.1* 3.6 3.6 3.8  CL  --  107 114*  --  120*  CO2  --  20* 19*  --  21*  GLUCOSE  --  165* 183*  --  160*  BUN  --  40* 46*  --  47*  CREATININE  --  2.46* 2.06*  --  2.10*  CALCIUM  --  8.6* 8.5*  --  8.6*  MG 2.2 2.4 2.4  --   --   PHOS 4.4 6.3*  --   --   --     Lipid Panel:  Recent Labs  Lab 12/31/20 0240 01/01/21 0508  CHOL 175  --   TRIG 481* 574*  HDL 45  --   CHOLHDL 3.9  --   VLDL UNABLE TO CALCULATE IF TRIGLYCERIDE OVER 400 mg/dL  --   LDLCALC UNABLE TO CALCULATE IF TRIGLYCERIDE OVER 400 mg/dL  --      HgbA1c:  Recent Labs  Lab 12/31/20 0240  HGBA1C 6.3*    Urine Drug Screen:  Recent Labs  Lab 12/31/20 0822  LABOPIA NONE DETECTED  COCAINSCRNUR NONE DETECTED  LABBENZ NONE DETECTED  AMPHETMU NONE DETECTED  THCU NONE DETECTED  LABBARB NONE DETECTED     Alcohol Level No results for input(s): ETH in the last 168 hours.  IMAGING past 24 hours CT HEAD WO CONTRAST (5MM)  Result Date: 01/03/2021 CLINICAL DATA:  Stroke follow-up EXAM: CT HEAD WITHOUT CONTRAST TECHNIQUE: Contiguous axial images were obtained from the base of the skull through the vertex without intravenous contrast. COMPARISON:  01/01/2021 FINDINGS: Brain: Unchanged bilateral intraventricular hemorrhage. Previously seen subarachnoid hemorrhage has nearly completely resolved. The intraparenchymal hematoma in the left caudate head is unchanged. There is periventricular  hypoattenuation compatible with chronic microvascular disease. Size and configuration of the ventricles are unchanged. Vascular: No abnormal hyperdensity of the major intracranial arteries or dural venous sinuses. No intracranial atherosclerosis. Skull: The visualized skull base, calvarium and extracranial soft tissues are normal. Sinuses/Orbits: No fluid levels or advanced mucosal thickening of the visualized paranasal sinuses. No mastoid or middle ear effusion. The orbits are normal. IMPRESSION: 1. Unchanged bilateral intraventricular hemorrhage and left caudate head intraparenchymal hematoma. 2. Near complete resolution of previously seen subarachnoid hemorrhage. 3. Unchanged size and configuration of the ventricles. Electronically Signed   By: Ulyses Jarred M.D.   On: 01/03/2021 21:08   MR ANGIO HEAD WO CONTRAST  Result Date: 01/04/2021 CLINICAL DATA:  55 year old male with acute intracranial hemorrhage, numerous scattered small acute infarcts in both cerebral hemispheres. EXAM: MRA HEAD WITHOUT CONTRAST TECHNIQUE: Angiographic images of the [place] were acquired using MRA technique without intravenous contrast. COMPARISON:  Neck MRA today.  Brain MRI 01/03/2021. FINDINGS: Posterior circulation: Antegrade flow in the posterior circulation with codominant distal vertebral arteries. No distal vertebral or basilar stenosis. Faintly visible patent PICA. Patent AICA origins, SCA and PCA origins. Mildly tortuous left P1 segment. Small posterior communicating arteries. Bilateral PCA branches are otherwise within normal limits. Anterior circulation: Antegrade flow in both ICA siphons. Ophthalmic and posterior communicating artery origins appear normal. No siphon stenosis. Normal MCA and ACA origins. Diminutive anterior communicating artery. Visible bilateral ACA branches are within normal limits. MCA M1 segments, bifurcations, and visible bilateral MCA branches are within normal limits. Anatomic variants: None. Other: Intraventricular hemorrhage. Ventricle size and configuration stable since yesterday. No midline shift. Basilar cisterns appear patent. Scattered parenchymal susceptibility in keeping with intra-axial blood products. IMPRESSION: 1.  Negative intracranial MRA. 2. Intracranial hemorrhage and ventricles appears stable from the MRI yesterday. Electronically Signed   By: Genevie Ann M.D.   On: 01/04/2021 10:24   MR ANGIO NECK WO CONTRAST  Result Date: 01/04/2021 CLINICAL DATA:  55 year old male with acute intracranial hemorrhage, numerous scattered small acute infarcts in both cerebral hemispheres. EXAM: MRA NECK WITHOUT CONTRAST TECHNIQUE: Angiographic images of the neck were acquired using MRA technique without intravenous contrast. Carotid stenosis measurements (when applicable) are obtained utilizing NASCET criteria, using the distal internal carotid diameter as the denominator. COMPARISON:  Brain MRI yesterday. FINDINGS: Time-of-flight neck MRA imaging. Four vessel arch configuration suspected with probable separate origin of the left vertebral  artery from the arch. Tortuous brachiocephalic artery and proximal right CCA. Antegrade flow in the right carotid with patent right carotid bifurcation and cervical right ICA with no evidence of hemodynamically significant stenosis. Similar tortuosity of the left CCA. No evidence of hemodynamically significant left carotid stenosis. Proximal subclavian and vertebral artery origins are patent. Antegrade flow in could dominant vertebral arteries to the skull base without evidence of significant stenosis. Mild vertebral artery tortuosity. Four vessel antegrade flow continues to the skull base. IMPRESSION: Mildly tortuous cervical carotid and vertebral arteries without evidence of stenosis. Electronically Signed   By: Genevie Ann M.D.   On: 01/04/2021 10:21   MR BRAIN WO CONTRAST  Result Date: 01/03/2021 CLINICAL DATA:  Neuro deficit, acute, stroke suspected; intracranial hemorrhage EXAM: MRI HEAD WITHOUT CONTRAST TECHNIQUE: Multiplanar, multiecho pulse sequences of the brain and surrounding structures were obtained without intravenous contrast. COMPARISON:  Correlation made with recent CT imaging FINDINGS: Brain: As seen on recent CT imaging, there is hemorrhage within the ventricular system and parenchymal hemorrhage involving the inferior left caudate head. There  is also T2 FLAIR hyperintensity in scattered cortical sulci reflecting subarachnoid hemorrhage also seen on CT. Resulting communicating hydrocephalus is present. There are additionally scattered foci of susceptibility involving the cerebellum, brainstem, right putamen, and subcortical white matter. These likely represent chronic microhemorrhages other than an inferior left frontal focus defect corresponds to mineralization on CT. Numerous small foci of restricted diffusion are present in bilateral cerebral hemispheres. There is minimal involvement of the superior right cerebellum as well. Patchy and confluent areas of T2 hyperintensity in the supratentorial  white matter are nonspecific but probably reflect combination of chronic microvascular ischemic changes as well as interstitial edema related to hydrocephalus. There is no mass lesion or mass effect. Vascular: Major vessel flow voids at the skull base are preserved. Skull and upper cervical spine: Normal marrow signal is preserved. Sinuses/Orbits: Minor mucosal thickening.  Orbits are unremarkable. Other: Sella is unremarkable.  Mastoid air cells are clear. IMPRESSION: Numerous small acute infarcts in bilateral cerebral hemispheres. Minimal involvement of the right superior cerebellum. Left caudate hemorrhage, intraventricular hemorrhage, and scattered sulcal subarachnoid hemorrhage as seen on recent CT imaging. Associated mild communicating hydrocephalus. Numerous chronic microhemorrhages probably secondary to hypertension. Chronic microvascular ischemic changes. Electronically Signed   By: Guadlupe Spanish M.D.   On: 01/03/2021 15:33   DG Chest Port 1 View  Result Date: 01/04/2021 CLINICAL DATA:  Respiratory failure EXAM: PORTABLE CHEST 1 VIEW COMPARISON:  Chest x-rays dated 01/03/2021. FINDINGS: 0404 hours. Endotracheal tube appears adequately positioned with tip approximately 5 cm above the carina. Enteric tube passes below the diaphragm. Heart size and mediastinal contours are stable. Lungs are clear. No pleural effusion or pneumothorax is seen. IMPRESSION: 1. Endotracheal tube appears adequately positioned with tip approximately 5 cm above the carina. 2. Lungs are clear. Electronically Signed   By: Bary Richard M.D.   On: 01/04/2021 08:00   DG CHEST PORT 1 VIEW  Result Date: 01/03/2021 CLINICAL DATA:  Endotracheal tube EXAM: PORTABLE CHEST 1 VIEW COMPARISON:  Chest x-ray from earlier same day. FINDINGS: Endotracheal tube is adequately positioned with tip at the level of the clavicles and approximately 5 cm above the carina. Enteric tube passes below the diaphragm. Lungs are clear. No pleural effusion or  pneumothorax is seen. Heart size and mediastinal contours are within normal limits. IMPRESSION: 1. Endotracheal tube adequately positioned with tip at the level of the clavicles and approximately 5 cm above the carina. 2. Enteric tube passes below the diaphragm. 3. Lungs are clear. Electronically Signed   By: Bary Richard M.D.   On: 01/03/2021 15:41    PHYSICAL EXAM General:  Patient is a well-developed, well-nourished male Ext: no C/C/E. HEENT: intubated, NG tube. Resp: intubated, tachypnic  CV: RRR.  GI: Abdomen soft with active bowel sounds Skin: no rashes.   Neurological:  Patient does not respond to name, noxious stimuli, or follow commands.   PERRL. Incomplete oculocephalic reflex.   RUE and RLE nonresponsive to noxious stimuli LUE and LLE nonresponsive to noxious stimuli. Coordination- Deferred Sensation-Deferred Gait- Deferred  ASSESSMENT/PLAN Mr. STUART MIRABILE is a 55 y.o. male with history of HTN (questionable compliance with medications), DM and CKD 3 presenting with headache and altered mental status. 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  He was hypertensive on arrival to 238/160 and was given Cleviprex for BP control.  He was also agitated and required restraints.  Repeat head CT 11/30 shows unchanged IPH with IVH  Head CTs done on 12/1 and 12/3 also demonstrates unchanged IPH with IVH.  MRI/MRA completed on 12/4.  ICH score 2  ICH with IVH:  left basal ganglia ICH with IVH likely secondary to hypertensive emergency CT head Large basal ganglia IPH with IVH involving the lateral, third and fourth ventricles with no significant mass effect or midline shift.  Repeat CT 11/30 0116 Unchanged IPH with IVH Repeat CT head 11/30 1629  Stable IPH with IVH, stable mild hydrocephalus 2D Echo EF [id number], grade 1 diastolic dysfunction, no atrial level shunt MRI-10/3-Numerous small acute infarcts in bilateral cerebral hemispheres. Minimal involvement  of the right superior cerebellum.  Left caudate hemorrhage, intraventricular hemorrhage, and scattered sulcal subarachnoid hemorrhage as seen on recent CT imaging. Associated mild communicating hydrocephalus. Numerous chronic microhemorrhages probably secondary to hypertension. Chronic microvascular ischemic changes MRA head and neck 10/4- Mildly tortuous cervical carotid and vertebral arteries without stenosis. Intracranial hemorrhage and ventricles appears stable from the MRI yesterday. CUS unremarkable LDL 111.4 HgbA1c 6.3 VTE prophylaxis - SCDs aspirin 81 mg daily prior to admission, now on No antithrombotic secondary to [place] Therapy recommendations:  SNF Disposition:  pending  Respiratory distress Intubated this am. CCM on board Tachypnea- PRN fentanyl and Versed ETT exchanged 12/4 due to a cuff leak Precedex for sedation- turned off 12/4  Hypertensive emergency: improving. Home meds:  Amlodipine 10 mg daily, metoprolol succinate 25 mg daily,  Unstable Cardene, still requiring Cardene to maintain BP at goal. SBP goal <160  home amlodipine 10 mg daily and metoprolol to 100 mg BID On Clonidine 0.3 mg q8h Long-term BP goal normotensive  Hyperlipidemia Home meds:  none LDL 111.4, goal < 70 Hold off statin for now due to acute ICH Consider statin at discharge  AKI on CKD Cre 1.83-2.23-2.46 -> 2.10 On IVF and TF Close monitoring I/O CCM on board  Other Stroke Risk Factors Questionable compliance with medications at home  Other Active Problems Hypokalemia 3.1 -> supplement Ileus seen on KUB- fleet enema give 12/1; bowel regimen in place  Hospital day # 5   Patient seen and examined by NP/APP with MD. MD to update note as needed.   Janine Ores, DNP, FNP-BC [facility] Pager: [phone]   ATTENDING ATTESTATION:   Dr. Reeves Forth evaluated pt independently, reviewed imaging, chart, labs. Discussed and formulated plan with the APP. Please see APP note  above for details.    Patient with left intracranial hemorrhage with intraventricular extension.  Overnight he appeared to worsen.  A stat head CT was completed.  CT is unchanged with no new bleed and no hydrocephalus.  He had an MRI completed yesterday and MRA today.  After the MRA his respiratory rate remained elevated in the 30s.  CCM was contacted and they discovered a leak in his ET tube and this was corrected.  He stabilized after the intervention.  Had a long discussion with the patient's son [name] who also wanted me to call his brother, Delfino Lovett at [phone].  I updated them on his condition his MRI and MRA results as well as repeat CT results.  I told him his prognosis is guarded and likely poor but too early to tell and they would have to continue following him closely in the ICU.  His brother who is a respiratory therapist will try to come tomorrow morning to come visit and get another update.    This patient is critically ill due to respiratory distress, stroke s/p tPA and at significant risk of  neurological worsening, death form heart failure, respiratory failure, recurrent stroke, bleeding from [facility], seizure, sepsis. This patient's care requires constant monitoring of vital signs, hemodynamics, respiratory and cardiac monitoring, review of multiple databases, neurological assessment, discussion with family, other specialists and medical decision making of high complexity. I spent 35 minutes of neurocritical care time in the care of this patient.   Jazarah Capili,MD    To contact Stroke Continuity provider, please refer to [email or web address] After hours, contact General Neurology

## 2021-01-04 NOTE — Procedures (Signed)
Intubation Procedure Note  MCCABE GLORIA  [id number]  [birth year]  Date:01/04/21  Time:1:46 PM   Provider Performing:Bulmaro Feagans    Procedure: Intubation (31500)  Indication(s) Respiratory Failure  Consent Unable to obtain consent due to emergent nature of procedure.   Anesthesia Fentanyl   Time Out Verified patient identification, verified procedure, site/side was marked, verified correct patient position, special equipment/implants available, medications/allergies/relevant history reviewed, required imaging and test results available.   Sterile Technique Usual hand hygeine, masks, and gloves were used   Procedure Description Patient positioned in bed supine.  Sedation given as noted above.  Tube exchanged over bougie for perforated cuff. Colorimetric and bilateral air entry present post procedure.    Complications/Tolerance None; patient tolerated the procedure well. Chest X-ray is ordered to verify placement.  Lynnell Catalan, MD [facility] ICU Physician [facility] [place]  Pager: [phone] Or Epic Secure Chat After hours: [phone].  01/04/2021, 1:49 PM

## 2021-01-04 NOTE — Progress Notes (Signed)
Patient ETT was advanced 2 cm from 26 to 28. Air was placed in cuff and patient still had air leak. MD was notified

## 2021-01-04 NOTE — Progress Notes (Signed)
Pt transported to MRI and back. No complications noted.

## 2021-01-04 NOTE — Progress Notes (Signed)
Can't get RR to stay under 30-35, despite sedation. RT to check vent. Will notify MD.

## 2021-01-04 NOTE — Progress Notes (Signed)
NAME:  Stephen Delgado, MRN:  [id number], DOB:  11-18-1965, LOS: 5 ADMISSION DATE:  12/30/2020, CONSULTATION DATE:  01/01/21 REFERRING MD:  xu (stroke), CHIEF COMPLAINT:  respiratory failure   History of Present Illness:  55 yo male presented with headache, nausea and altered mental status.  CT head showed acute Lt ICH with IVH likely related to hypertensive emergency (BP 195/116).  Had persistent hypertension and concern for airway protection, and PCCM asked to assist with ICU management.  Pertinent  Medical History  HTN, DM type 2, CKD 3a, OSA  Significant Hospital Events:  11/29 Admit, start cleviprex 12/01 PCCM consulted; changed from cleviprex to cardene due to elevated triglycerides 12/03 intubated; episode of vomiting >> tube feeds held 12/04 resume trickle tube feeds  Studies:  CT head 11/29 >> large amount of IVH involving lateral/3rd/4th ventricles, ICH in Lt BG, chronic microvascular ischemic changes Echo 11/30 >> EF 50 to 55%, mod LVH, grade 1 DD, ascending aorta 37 mm EEG 12/01 >> continuous generalized slowing MRI brain 12/03 >> numerous small acute infarcts in b/l cerebral hemispheres, Lt caudate hemorrhage, IVH, scattered sulcal SAH, mild communicating hydrocephalus  Interim History / Subjective:  Episode of vomiting yesterday.  Tm 100.5.  noted to have sinus drainage.  Objective   Blood pressure 139/80, pulse 67, temperature 99.8 F (37.7 C), temperature source Axillary, resp. rate 20, height 6' (1.829 m), weight 85.2 kg, SpO2 100 %.    Vent Mode: PRVC FiO2 (%):  [30 %-40 %] 30 % Set Rate:  [18 bmp] 18 bmp Vt Set:  [620 mL] 620 mL PEEP:  [5 cmH20] 5 cmH20 Plateau Pressure:  [14 cmH20-15 cmH20] 14 cmH20   Intake/Output Summary (Last 24 hours) at 01/04/2021 7829 Last data filed at 01/04/2021 5621 Gross per 24 hour  Intake 1309.27 ml  Output 2325 ml  Net -1015.73 ml   Filed Weights   12/31/20 1435  Weight: 85.2 kg    Examination:  General - sedated Eyes  - pupils midpoint ENT - ETT in place Cardiac - regular rate/rhythm, no murmur Chest - equal breath sounds b/l, no wheezing or rales Abdomen - soft, non tender, + bowel sounds Extremities - no cyanosis, clubbing, or edema Skin - no rashes Neuro - RASS -3    Resolved Hospital Problem list     Assessment & Plan:   Compromised airway. - continue full vent support - goal SpO2 > 92% - f/u CXR  Aspiration pneumonitis. - day 2 of zosyn - f/u sputum culture from 12/03  Lt BG ICH with IVH 2nd to HTN emergency. - for MRA 12/04  HTN emergency. - wean cardene for goal SBP < 160 - continue norvasc, catapres, lopressor  CKD3a. - baseline creatinine 1.71 from 12/30/20 - f/u BMET, monitor urine outpt  DM type 2. - SSI  Dypshagia. Ileus, vomiting. - resume trickle tube feeds 12/04 - bowel regimen  Best Practice (right click and "Reselect all SmartList Selections" daily)   Diet/type: NPO DVT prophylaxis: SCD GI prophylaxis: N/A Lines: N/A Foley:  Yes, and it is still needed Code Status:  full code Family: updated pt's son by phone on 12/03  Labs    CMP Latest Ref Rng & Units 01/04/2021 01/03/2021 01/03/2021  Glucose 70 - 99 mg/dL 308(M) - 578(I)  BUN 6 - 20 mg/dL 69(G) - 29(B)  Creatinine 0.61 - 1.24 mg/dL 2.84(X) - 3.24(M)  Sodium 135 - 145 mmol/L 148(H) 147(H) 144  Potassium 3.5 - 5.1 mmol/L 3.8 3.6 3.6  Chloride 98 - 111 mmol/L 120(H) - 114(H)  CO2 22 - 32 mmol/L 21(L) - 19(L)  Calcium 8.9 - 10.3 mg/dL 8.6(L) - 8.5(L)  Total Protein 6.5 - 8.1 g/dL - - -  Total Bilirubin 0.3 - 1.2 mg/dL - - -  Alkaline Phos 38 - 126 U/L - - -  AST 15 - 41 U/L - - -  ALT 0 - 44 U/L - - -    CBC Latest Ref Rng & Units 01/04/2021 01/03/2021 01/03/2021  WBC 4.0 - 10.5 K/uL 14.8(H) - 15.0(H)  Hemoglobin 13.0 - 17.0 g/dL 12.4(L) 11.6(L) 13.3  Hematocrit 39.0 - 52.0 % 39.4 34.0(L) 41.1  Platelets 150 - 400 K/uL 207 - 282   ABG    Component Value Date/Time   PHART 7.416 01/03/2021  0857   PCO2ART 31.2 (L) 01/03/2021 0857   PO2ART 91 01/03/2021 0857   HCO3 20.0 01/03/2021 0857   TCO2 21 (L) 01/03/2021 0857   ACIDBASEDEF 4.0 (H) 01/03/2021 0857   O2SAT 97.0 01/03/2021 0857    CBG (last 3)  Recent Labs    01/03/21 2321 01/04/21 0331 01/04/21 0730  GLUCAP 149* 149* 147*   Critical care time: 34 minutes  Chesley Mires, MD [place] Pager - [phone] 01/04/2021, 8:19 AM

## 2021-01-04 NOTE — Progress Notes (Signed)
Pt's cousin Minerva Areola updated at bedside.

## 2021-01-05 ENCOUNTER — Inpatient Hospital Stay (HOSPITAL_COMMUNITY): Payer: 59

## 2021-01-05 DIAGNOSIS — I610 Nontraumatic intracerebral hemorrhage in hemisphere, subcortical: Secondary | ICD-10-CM | POA: Diagnosis not present

## 2021-01-05 DIAGNOSIS — I161 Hypertensive emergency: Secondary | ICD-10-CM | POA: Diagnosis not present

## 2021-01-05 DIAGNOSIS — J988 Other specified respiratory disorders: Secondary | ICD-10-CM | POA: Diagnosis not present

## 2021-01-05 DIAGNOSIS — J690 Pneumonitis due to inhalation of food and vomit: Secondary | ICD-10-CM | POA: Diagnosis not present

## 2021-01-05 LAB — CBC
HCT: 41.8 % (ref 39.0–52.0)
Hemoglobin: 13 g/dL (ref 13.0–17.0)
MCH: 23.8 pg — ABNORMAL LOW (ref 26.0–34.0)
MCHC: 31.1 g/dL (ref 30.0–36.0)
MCV: 76.4 fL — ABNORMAL LOW (ref 80.0–100.0)
Platelets: 220 10*3/uL (ref 150–400)
RBC: 5.47 MIL/uL (ref 4.22–5.81)
RDW: 15.7 % — ABNORMAL HIGH (ref 11.5–15.5)
WBC: 20.5 10*3/uL — ABNORMAL HIGH (ref 4.0–10.5)
nRBC: 0 % (ref 0.0–0.2)

## 2021-01-05 LAB — GLUCOSE, CAPILLARY
Glucose-Capillary: 149 mg/dL — ABNORMAL HIGH (ref 70–99)
Glucose-Capillary: 174 mg/dL — ABNORMAL HIGH (ref 70–99)
Glucose-Capillary: 219 mg/dL — ABNORMAL HIGH (ref 70–99)
Glucose-Capillary: 187 mg/dL — ABNORMAL HIGH (ref 70–99)
Glucose-Capillary: 187 mg/dL — ABNORMAL HIGH (ref 70–99)
Glucose-Capillary: 186 mg/dL — ABNORMAL HIGH (ref 70–99)

## 2021-01-05 LAB — BASIC METABOLIC PANEL
Anion gap: 9 (ref 5–15)
BUN: 42 mg/dL — ABNORMAL HIGH (ref 6–20)
CO2: 21 mmol/L — ABNORMAL LOW (ref 22–32)
Calcium: 9 mg/dL (ref 8.9–10.3)
Chloride: 123 mmol/L — ABNORMAL HIGH (ref 98–111)
Creatinine, Ser: 2.04 mg/dL — ABNORMAL HIGH (ref 0.61–1.24)
GFR, Estimated: 37 mL/min — ABNORMAL LOW (ref >60)
Glucose, Bld: 194 mg/dL — ABNORMAL HIGH (ref 70–99)
Potassium: 3.6 mmol/L (ref 3.5–5.1)
Sodium: 153 mmol/L — ABNORMAL HIGH (ref 135–145)

## 2021-01-05 MED ORDER — HEPARIN SODIUM (PORCINE) 5000 UNIT/ML IJ SOLN
5000.0000 [IU] | Freq: Three times a day (TID) | INTRAMUSCULAR | Status: DC
  Administered 2021-01-05 – 2021-03-21 (×224): 5000 [IU] via SUBCUTANEOUS
  Filled 2021-01-05 (×222): qty 1

## 2021-01-05 MED ORDER — LISINOPRIL 5 MG PO TABS
5.0000 mg | ORAL_TABLET | Freq: Every day | ORAL | Status: DC
Start: 2021-01-05 — End: 2021-01-06
  Administered 2021-01-05: 5 mg
  Filled 2021-01-05: qty 1

## 2021-01-05 MED ORDER — OSMOLITE 1.5 CAL PO LIQD
1000.0000 mL | ORAL | Status: DC
Start: 2021-01-05 — End: 2021-01-09
  Administered 2021-01-05 – 2021-01-09 (×5): 1000 mL

## 2021-01-05 MED ORDER — FREE WATER
200.0000 mL | Status: DC
  Administered 2021-01-05 – 2021-01-07 (×12): 200 mL

## 2021-01-05 MED ORDER — CEFAZOLIN SODIUM-DEXTROSE 2-4 GM/100ML-% IV SOLN
2.0000 g | Freq: Three times a day (TID) | INTRAVENOUS | Status: AC
  Administered 2021-01-05 – 2021-01-10 (×15): 2 g via INTRAVENOUS
  Filled 2021-01-05 (×15): qty 100

## 2021-01-05 NOTE — Progress Notes (Signed)
RT attempted routine SBT with pt. Upon placing pt in CPAP/PS 10/5 30% pt with significant increase in his work of breathing as well as a bradycardic event to 46 from 64. Pt returned to full support and HR increased to 75 and work of breathing increased. RT will continue to monitor and be available as needed.

## 2021-01-05 NOTE — Progress Notes (Signed)
Nutrition Follow-up  DOCUMENTATION CODES:   Not applicable  INTERVENTION:   Tube feeding via Cortrak tube: Osmolite 1.5 @ 20 ml/hr -increase by 10 ml every 8 hours to goal rate of 60 ml/hr (1440 ml/day) ProSource TF 45 ml daily   Tube feeding regimen provides 2200 kcal, 101 grams of protein, and 1097 ml of H2O.  200 ml free water every 4 hours  Total free water: 2297 ml    NUTRITION DIAGNOSIS:   Inadequate oral intake related to lethargy/confusion, dysphagia as evidenced by NPO status. Ongoing.   GOAL:   Patient will meet greater than or equal to 90% of their needs Not met.   MONITOR:   Diet advancement, Labs, Weight trends  REASON FOR ASSESSMENT:   Ventilator, Consult Enteral/tube feeding initiation and management  ASSESSMENT:   55 year old male who presented to the ED on 11/29 with AMS. PMH of HTN, T2DM, CKD stage III. 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  Pt discussed during ICU rounds and with RN.  Pt had another episode of emesis 12/3, TF held and resumed 12/4.  Pt had BM 12/4, + bowel sounds, abd soft.    Pt on vent, no family present, Spoke with RN about TF advancement.   Patient is currently intubated on ventilator support Temp (24hrs), Avg:99.9 F (37.7 C), Min:97.4 F (36.3 C), Max:101.5 F (38.6 C)   11/30 cortrak placed; tip gastric 12/2 trickle TF 12/3 pt intubated due to increased WOB, +emesis, TF held 12/4 trickle TF restarted 12/5 advancement orders placed  Medications reviewed and include: colace, SSI, protonix, miralax, senokot-s  NS @ 75 ml/hr  Cardene  Labs reviewed: Na 153, PO4: 6.3 CBG's: 149-219   Diet Order:   Diet Order             Diet NPO time specified  Diet effective now                   EDUCATION NEEDS:   Not appropriate for education at this time  Skin:  Skin Assessment: Reviewed RN Assessment  Last BM:  12/4 small; type 7  Height:    Ht Readings from Last 1 Encounters:  01/03/21 6' (1.829 m)    Weight:   Wt Readings from Last 1 Encounters:  12/31/20 85.2 kg   BMI:  Body mass index is 25.47 kg/m.  Estimated Nutritional Needs:   Kcal:  2000-2200  Protein:  100-115 grams  Fluid:  >/= 2.0 L  Maisy Newport P., RD, LDN, CNSC See AMiON for contact information

## 2021-01-05 NOTE — Progress Notes (Signed)
STROKE TEAM PROGRESS NOTE   INTERVAL HISTORY Patient is seen in his room with no family at the bedside.  Patient remains unresponsive and has been off sedation from this morning.  Eyes are partially open but does not blink to threat or follow any commands.  He is minimally responsive in the extremities in 1 to sternal rub in the room no purposeful movements.  He is unable to wean off ventilatory support as he becomes tachypneic.  He has been started on antibiotics for aspiration pneumonia from staff aureus.  No family about bedside. Vitals:   01/05/21 1145 01/05/21 1200 01/05/21 1300 01/05/21 1315  BP: (!) 160/71 (!) 166/72 (!) 172/87 (!) 147/79  Pulse: 81 85 91 81  Resp: 12 12 12 11   Temp:  99.4 F (37.4 C)    TempSrc:  Axillary    SpO2: 97% 98% 99% 95%  Weight:      Height:       CBC:  Recent Labs  Lab 12/30/20 1720 12/31/20 0240 01/04/21 0439 01/05/21 0402  WBC 15.1*   < > 14.8* 20.5*  NEUTROABS 13.3*  --   --   --   HGB 14.5   < > 12.4* 13.0  HCT 45.6   < > 39.4 41.8  MCV 74.4*   < > 75.6* 76.4*  PLT 243   < > 207 220   < > = values in this interval not displayed.   Basic Metabolic Panel:  Recent Labs  Lab 01/01/21 1653 01/02/21 0448 01/03/21 0458 01/03/21 0857 01/04/21 0439 01/05/21 0402  NA  --  138 144   < > 148* 153*  K  --  3.1* 3.6   < > 3.8 3.6  CL  --  107 114*  --  120* 123*  CO2  --  20* 19*  --  21* 21*  GLUCOSE  --  165* 183*  --  160* 194*  BUN  --  40* 46*  --  47* 42*  CREATININE  --  2.46* 2.06*  --  2.10* 2.04*  CALCIUM  --  8.6* 8.5*  --  8.6* 9.0  MG 2.2 2.4 2.4  --   --   --   PHOS 4.4 6.3*  --   --   --   --    < > = values in this interval not displayed.   Lipid Panel:  Recent Labs  Lab 12/31/20 0240 01/01/21 0508  CHOL 175  --   TRIG 481* 574*  HDL 45  --   CHOLHDL 3.9  --   VLDL UNABLE TO CALCULATE IF TRIGLYCERIDE OVER 400 mg/dL  --   LDLCALC UNABLE TO CALCULATE IF TRIGLYCERIDE OVER 400 mg/dL  --    14/01/22:  Recent Labs   Lab 12/31/20 0240  HGBA1C 6.3*   Urine Drug Screen:  Recent Labs  Lab 12/31/20 0822  LABOPIA NONE DETECTED  COCAINSCRNUR NONE DETECTED  LABBENZ NONE DETECTED  AMPHETMU NONE DETECTED  THCU NONE DETECTED  LABBARB NONE DETECTED    Alcohol Level No results for input(s): ETH in the last 168 hours.  IMAGING past 24 hours DG Chest Port 1 View  Result Date: 01/05/2021 CLINICAL DATA:  Respiratory failure EXAM: PORTABLE CHEST 1 VIEW COMPARISON:  Chest radiograph 01/04/2021 FINDINGS: The enteric catheter tip is approximately 3.1 cm from the carina. The enteric catheter tip is off the field of view. The cardiomediastinal silhouette is stable. Aeration of the lungs is not significantly changed. There is  no new or worsening focal airspace disease. There is no significant pleural effusion. There is no pneumothorax. The bones are stable. IMPRESSION: No significant interval change in lung aeration since 01/04/2021. No new or worsening focal airspace disease. Electronically Signed   By: Valetta Mole M.D.   On: 01/05/2021 08:48    PHYSICAL EXAM General:  Patient is a well-developed, well-nourished middle-age African-American male who is intubated but not sedated Ext: no C/C/E. HEENT: intubated, NG tube. Resp: intubated, tachypnic  CV: RRR.  GI: Abdomen soft with active bowel sounds Skin: no rashes.   Neurological: Eyes are partially open but patient does not respond to name, noxious stimuli, or follow commands.   PERRL. Incomplete oculocephalic reflex.  Cough and gag are present.  Has some spontaneous respiratory effort above ventilator settings RUE and RLE nonresponsive to noxious stimuli no response to sternal rub or nailbed pressure LUE and LLE nonresponsive to noxious stimuli. Coordination- Deferred Sensation-Deferred Gait- Deferred  ASSESSMENT/PLAN Stephen Delgado is a 55 y.o. male with history of HTN (questionable compliance with medications), DM and CKD 3 presenting with headache and  altered mental status. 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  He was hypertensive on arrival to 238/160 and was given Cleviprex for BP control.  He was also agitated and required restraints.  Repeat head CT 11/30 shows unchanged IPH with IVH  Head CTs done on 12/1 and 12/3 also demonstrates unchanged IPH with IVH.  MRI/MRA completed on 12/4.  ICH score 2  ICH with IVH:  left basal ganglia ICH with IVH likely secondary to hypertensive emergency with bicerebral multiple subcortical lacunar infarcts as well CT head Large basal ganglia IPH with IVH involving the lateral, third and fourth ventricles with no significant mass effect or midline shift.  Repeat CT 11/30 0116 Unchanged IPH with IVH Repeat CT head 11/30 1629  Stable IPH with IVH, stable mild hydrocephalus 2D Echo EF [id number], grade 1 diastolic dysfunction, no atrial level shunt MRI-10/3-Numerous small acute infarcts in bilateral cerebral hemispheres. Minimal involvement of the right superior cerebellum.  Left caudate hemorrhage, intraventricular hemorrhage, and scattered sulcal subarachnoid hemorrhage as seen on recent CT imaging. Associated mild communicating hydrocephalus. Numerous chronic microhemorrhages probably secondary to hypertension. Chronic microvascular ischemic changes MRA head and neck 10/4- Mildly tortuous cervical carotid and vertebral arteries without stenosis. Intracranial hemorrhage and ventricles appears stable from the MRI yesterday. CUS unremarkable LDL 111.4 HgbA1c 6.3 VTE prophylaxis - SCDs aspirin 81 mg daily prior to admission, now on No antithrombotic secondary to [place] Therapy recommendations:  SNF Disposition:  pending  Respiratory distress Intubated this am. CCM on board Tachypnea- PRN fentanyl and Versed ETT exchanged 12/4 due to a cuff leak Precedex for sedation- turned off 12/4  Hypertensive emergency: improving. Home meds:  Amlodipine 10 mg daily, metoprolol succinate  25 mg daily,  Unstable Cardene, still requiring Cardene to maintain BP at goal. SBP goal <160  home amlodipine 10 mg daily and metoprolol to 100 mg BID On Clonidine 0.3 mg q8h Long-term BP goal normotensive  Hyperlipidemia Home meds:  none LDL 111.4, goal < 70 Hold off statin for now due to acute ICH Consider statin at discharge  AKI on CKD Cre 1.83-2.23-2.46 -> 2.10 On IVF and TF Close monitoring I/O CCM on board  Other Stroke Risk Factors Questionable compliance with medications at home  Other Active Problems Hypokalemia 3.1 -> supplement Ileus seen on KUB- fleet enema give 12/1;  bowel regimen in place  Hospital day # 6    I have personally obtained history,examined this patient, reviewed notes, independently viewed imaging studies, participated in medical decision making and plan of care.ROS completed by me personally and pertinent positives fully documented  I have made any additions or clarifications directly to the above note.  Patient's neurological exam remains quite poor but he was intubated just 2 days ago for severe aspiration pneumonia from staff infection.  Recommend continue ongoing ventilatory support and antibiotics as per critical care team.  If neurological exam does not improve significantly next to 3 days prognosis would be very guarded.  No family available at the bedside for discussion today.  Discussed with Dr. Lynetta Mare critical care medicine.This patient is critically ill and at significant risk of neurological worsening, death and care requires constant monitoring of vital signs, hemodynamics,respiratory and cardiac monitoring, extensive review of multiple databases, frequent neurological assessment, discussion with family, other specialists and medical decision making of high complexity.I have made any additions or clarifications directly to the above note.This critical care time does not reflect procedure time, or teaching time or supervisory time of PA/NP/Med  Resident etc but could involve care discussion time.  I spent 30 minutes of neurocritical care time  in the care of  this patient.      Stephen Contras, MD Medical Director [facility] Pager: [phone] 01/05/2021 1:57 PM    To contact Stroke Continuity provider, please refer to [email or web address] After hours, contact General Neurology

## 2021-01-05 NOTE — Progress Notes (Signed)
NAME:  Stephen Delgado, MRN:  [id number], DOB:  1965-04-10, LOS: 6 ADMISSION DATE:  12/30/2020, CONSULTATION DATE:  01/01/21 REFERRING MD:  xu (stroke), CHIEF COMPLAINT:  respiratory failure   History of Present Illness:  55 yo male presented with headache, nausea and altered mental status.  CT head showed acute Lt ICH with IVH likely related to hypertensive emergency (BP 195/116).  Had persistent hypertension and concern for airway protection, and PCCM asked to assist with ICU management.  Pertinent  Medical History  HTN, DM type 2, CKD 3a, OSA  Significant Hospital Events:  11/29 Admit, start cleviprex 12/01 PCCM consulted; changed from cleviprex to cardene due to elevated triglycerides 12/03 intubated; episode of vomiting >> tube feeds held 12/04 resume trickle tube feeds  Studies:  CT head 11/29 >> large amount of IVH involving lateral/3rd/4th ventricles, ICH in Lt BG, chronic microvascular ischemic changes Echo 11/30 >> EF 50 to 55%, mod LVH, grade 1 DD, ascending aorta 37 mm EEG 12/01 >> continuous generalized slowing MRI brain 12/03 >> numerous small acute infarcts in b/l cerebral hemispheres, Lt caudate hemorrhage, IVH, scattered sulcal SAH, mild communicating hydrocephalus  Interim History / Subjective:  Tube exchange for blown cuff yesterday. Mental status remains unchanged.  Increased WOB on weaning attempt this morning. Fevers  Objective   Blood pressure (!) 156/85, pulse 76, temperature (!) 97.4 F (36.3 C), temperature source Axillary, resp. rate 20, height 6' (1.829 m), weight 85.2 kg, SpO2 98 %.    Vent Mode: PRVC FiO2 (%):  [30 %-40 %] 30 % Set Rate:  [16 bmp] 16 bmp Vt Set:  [620 mL] 620 mL PEEP:  [5 cmH20] 5 cmH20 Plateau Pressure:  [15 cmH20-18 cmH20] 16 cmH20   Intake/Output Summary (Last 24 hours) at 01/05/2021 0847 Last data filed at 01/05/2021 0800 Gross per 24 hour  Intake 2434.71 ml  Output 1780 ml  Net 654.71 ml    Filed Weights   12/31/20 1435   Weight: 85.2 kg    Examination:  General - sedated Eyes - pupils midpoint ENT - ETT in place Cardiac - regular rate/rhythm, no murmur Chest - equal breath sounds b/l, no wheezing or rales Abdomen - soft, non tender, + bowel sounds Extremities - no cyanosis, clubbing, or edema Skin - no rashes Neuro - RASS -3  Ancillary tests personally reviewed:    CXR 12/5: ETT in good position with clear lung fields.  Hypernatremia to 153 MSSA in sputum Leukocytosis to 20.5 MRI shows multiple small strokes consistent with hypertensive disease.  MRI shows no vascular abnormalities to explain hemorrhage. 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 Acute hypoxic respiratory failure due to Staphylococcus aureus aspiration pneumonia Hypernatremia due to free water restriction and insensitive losses. Stage IIIa CKD Type 2 diabetes   Plan:  -Optimize enteral antihypertensive regimen and wean nicardipine to off.  Keep systolic blood pressure less than 160.  Long-term target less than 140.  Add lisinopril.  Hold Lopressor due to bradycardia. -Taper antibiotics to cefazolin and treat for 7 days. -Start free water flushes to correct hypernatremia. -Continue current insulin regimen. - Long term prognosis may be reasonable given that the majority of blood is intraventricular and patient is relatively young.  Have introduced idea of trach and PEG to family.   Best Practice (right click and "Reselect all SmartList Selections" daily)   Diet/type: tubefeeds  DVT prophylaxis: prophylactic heparin  GI prophylaxis: PPI Lines: N/A Foley:  Condom catheter Code Status:  full code Family: Family updated at bedside 12/5.  CRITICAL CARE Performed by: Lynnell Catalan   Total critical care time: 35 minutes  Critical care time was  exclusive of separately billable procedures and treating other patients.  Critical care was necessary to treat or prevent imminent or life-threatening deterioration.  Critical care was time spent personally by me on the following activities: development of treatment plan with patient and/or surrogate as well as nursing, discussions with consultants, evaluation of patient's response to treatment, examination of patient, obtaining history from patient or surrogate, ordering and performing treatments and interventions, ordering and review of laboratory studies, ordering and review of radiographic studies, pulse oximetry, re-evaluation of patient's condition and participation in multidisciplinary rounds.  Lynnell Catalan, MD [facility] ICU Physician [facility] [place] [facility]  Pager: [phone] Mobile: [phone] After hours: [phone].  01/05/2021, 9:17 AM

## 2021-01-06 ENCOUNTER — Inpatient Hospital Stay (HOSPITAL_COMMUNITY): Payer: 59

## 2021-01-06 DIAGNOSIS — I610 Nontraumatic intracerebral hemorrhage in hemisphere, subcortical: Secondary | ICD-10-CM | POA: Diagnosis not present

## 2021-01-06 DIAGNOSIS — J988 Other specified respiratory disorders: Secondary | ICD-10-CM | POA: Diagnosis not present

## 2021-01-06 DIAGNOSIS — I612 Nontraumatic intracerebral hemorrhage in hemisphere, unspecified: Secondary | ICD-10-CM

## 2021-01-06 DIAGNOSIS — I161 Hypertensive emergency: Secondary | ICD-10-CM | POA: Diagnosis not present

## 2021-01-06 DIAGNOSIS — J690 Pneumonitis due to inhalation of food and vomit: Secondary | ICD-10-CM | POA: Diagnosis not present

## 2021-01-06 LAB — CULTURE, RESPIRATORY W GRAM STAIN: Gram Stain: NONE SEEN

## 2021-01-06 LAB — GLUCOSE, CAPILLARY
Glucose-Capillary: 189 mg/dL — ABNORMAL HIGH (ref 70–99)
Glucose-Capillary: 229 mg/dL — ABNORMAL HIGH (ref 70–99)
Glucose-Capillary: 235 mg/dL — ABNORMAL HIGH (ref 70–99)
Glucose-Capillary: 202 mg/dL — ABNORMAL HIGH (ref 70–99)
Glucose-Capillary: 165 mg/dL — ABNORMAL HIGH (ref 70–99)
Glucose-Capillary: 174 mg/dL — ABNORMAL HIGH (ref 70–99)

## 2021-01-06 MED ORDER — HYDRALAZINE HCL 20 MG/ML IJ SOLN
20.0000 mg | Freq: Four times a day (QID) | INTRAMUSCULAR | Status: DC | PRN
  Administered 2021-01-06 – 2021-01-08 (×2): 20 mg via INTRAVENOUS
  Filled 2021-01-06 (×2): qty 1

## 2021-01-06 MED ORDER — CARVEDILOL 12.5 MG PO TABS
12.5000 mg | ORAL_TABLET | Freq: Two times a day (BID) | ORAL | Status: DC
  Administered 2021-01-06 – 2021-01-07 (×3): 12.5 mg
  Filled 2021-01-06 (×3): qty 1

## 2021-01-06 MED ORDER — INSULIN ASPART 100 UNIT/ML IJ SOLN
3.0000 [IU] | INTRAMUSCULAR | Status: DC
  Administered 2021-01-06 – 2021-01-12 (×35): 3 [IU] via SUBCUTANEOUS

## 2021-01-06 MED ORDER — LISINOPRIL 10 MG PO TABS
10.0000 mg | ORAL_TABLET | Freq: Every day | ORAL | Status: DC
  Administered 2021-01-06: 10 mg
  Filled 2021-01-06: qty 1

## 2021-01-06 MED ORDER — DEXMEDETOMIDINE HCL IN NACL 400 MCG/100ML IV SOLN
0.4000 ug/kg/h | INTRAVENOUS | Status: DC
  Administered 2021-01-06: 0.4 ug/kg/h via INTRAVENOUS
  Administered 2021-01-07 (×2): 0.5 ug/kg/h via INTRAVENOUS
  Administered 2021-01-07: 0.6 ug/kg/h via INTRAVENOUS
  Administered 2021-01-08: 0.5 ug/kg/h via INTRAVENOUS
  Filled 2021-01-06 (×5): qty 100

## 2021-01-06 NOTE — Progress Notes (Signed)
NAME:  Stephen Delgado, MRN:  [id number], DOB:  02-12-1965, LOS: 7 ADMISSION DATE:  12/30/2020, CONSULTATION DATE:  01/01/21 REFERRING MD:  xu (stroke), CHIEF COMPLAINT:  respiratory failure   History of Present Illness:  55 yo male presented with headache, nausea and altered mental status.  CT head showed acute Lt ICH with IVH likely related to hypertensive emergency (BP 195/116).  Had persistent hypertension and concern for airway protection, and PCCM asked to assist with ICU management.  Pertinent  Medical History  HTN, DM type 2, CKD 3a, OSA  Significant Hospital Events:  11/29 Admit, start cleviprex 12/01 PCCM consulted; changed from cleviprex to cardene due to elevated triglycerides 12/03 intubated; episode of vomiting >> tube feeds held 12/04 resume trickle tube feeds  Studies:  CT head 11/29 >> large amount of IVH involving lateral/3rd/4th ventricles, ICH in Lt BG, chronic microvascular ischemic changes Echo 11/30 >> EF 50 to 55%, mod LVH, grade 1 DD, ascending aorta 37 mm EEG 12/01 >> continuous generalized slowing MRI brain 12/03 >> numerous small acute infarcts in b/l cerebral hemispheres, Lt caudate hemorrhage, IVH, scattered sulcal SAH, mild communicating hydrocephalus 12/5 Started on free water for hypernatremia. Cefazolin for MSSA pneumonia.  Interim History / Subjective:  Still febrile. Still on Cardene for hypertension.   Objective   Blood pressure 132/68, pulse 64, temperature (!) 101.3 F (38.5 C), temperature source Axillary, resp. rate 16, height 6' (1.829 m), weight 85.2 kg, SpO2 96 %.    Vent Mode: PRVC FiO2 (%):  [30 %] 30 % Set Rate:  [16 bmp] 16 bmp Vt Set:  [620 mL] 620 mL PEEP:  [5 cmH20] 5 cmH20 Pressure Support:  [5 cmH20-13 cmH20] 5 cmH20 Plateau Pressure:  [16 cmH20-20 cmH20] 20 cmH20   Intake/Output Summary (Last 24 hours) at 01/06/2021 0804 Last data filed at 01/06/2021 0600 Gross per 24 hour  Intake 4746.97 ml  Output 2100 ml  Net 2646.97  ml    Filed Weights   12/31/20 1435  Weight: 85.2 kg    Examination:  General - off sedation, on Cardene Eyes - pupils midpoint ENT - ETT in place Cardiac - regular rate/rhythm, no murmur Chest - equal breath sounds b/l, no wheezing or rales Abdomen - soft, non tender, + bowel sounds Extremities - no cyanosis, clubbing, or edema Skin - no rashes Neuro - extensor posturing to pain .   Ancillary tests personally reviewed:    CXR 12/5: ETT in good position with clear lung fields.  Hypernatremia to 153 MSSA in sputum Leukocytosis to 20.5 MRI shows multiple small strokes consistent with hypertensive disease.  MRI shows no vascular abnormalities to explain hemorrhage. 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 Acute hypoxic respiratory failure due to Staphylococcus aureus aspiration pneumonia Hypernatremia due to free water restriction and insensitive losses. Stage IIIa CKD Type 2 diabetes   Plan:  -Optimize enteral antihypertensive regimen and wean nicardipine to off.  Keep systolic blood pressure less than 160.  Long-term target less than 140. Increase lisinopril.  Start carvedilol.  -Taper antibiotics to cefazolin and treat for 7 days. -Start free water flushes to correct hypernatremia. -Continue current insulin regimen. - Long term prognosis may be reasonable given that the majority of blood is intraventricular and patient is relatively young.  Have introduced idea of trach and PEG to family.   Best Practice (right click  and "Reselect all SmartList Selections" daily)   Diet/type: tubefeeds  DVT prophylaxis: prophylactic heparin  GI prophylaxis: PPI Lines: N/A Foley:  Condom catheter Code Status:  full code Family: Family updated at bedside 12/5.  CRITICAL CARE Performed by: Kipp Brood   Total critical care time: 35 minutes  Critical care time was exclusive of separately billable procedures and treating other patients.  Critical care was necessary to treat or prevent imminent or life-threatening deterioration.  Critical care was time spent personally by me on the following activities: development of treatment plan with patient and/or surrogate as well as nursing, discussions with consultants, evaluation of patient's response to treatment, examination of patient, obtaining history from patient or surrogate, ordering and performing treatments and interventions, ordering and review of laboratory studies, ordering and review of radiographic studies, pulse oximetry, re-evaluation of patient's condition and participation in multidisciplinary rounds.  Kipp Brood, MD [facility] ICU Physician [place]  Pager: [phone] Mobile: [phone] After hours: [phone].  01/06/2021, 8:04 AM

## 2021-01-06 NOTE — Progress Notes (Signed)
RT transported pt to and from CT without event. 

## 2021-01-06 NOTE — Progress Notes (Signed)
Patient's has increase respiratory rate and use of accessory muscles on the ventilator.  CCM rounded and verbal order for low dose precedex.  RT also awared.

## 2021-01-06 NOTE — Plan of Care (Signed)
Reported by RN at shift change that pt had right downward gaze, which was relatively new. Pt had last brain imaging of MRI brain on 10/3.  Given downward gaze, concerning for hydrocephalus, stat head CT ordered which showed no acute change.  Rechecking on patient at 9:30 PM, patient still has rightward gaze but not much downward gaze.  There is right upper gaze likely due to patient left pontine infarct.  We will continue close monitoring.  Marvel Plan, MD PhD Stroke Neurology 01/06/2021 9:56 PM

## 2021-01-06 NOTE — Progress Notes (Signed)
STROKE TEAM PROGRESS NOTE   INTERVAL HISTORY Patient is seen in his room with no family at the bedside.  Patient remains unresponsive with eyes are partially open but does not blink to threat or follow any commands.  He is minimally responsive in the extremities to endotracheal tube suction with extensor posturing in the left leg only.  He was weaned off ventilatory support for 4 to 6 hours yesterday.  He has been started on antibiotics for aspiration pneumonia from staff aureus.  No family about bedside. Vitals:   01/06/21 1200 01/06/21 1215 01/06/21 1230 01/06/21 1300  BP: (!) 174/81 (!) 149/73 (!) 156/74 (!) 165/72  Pulse: 96 68 68 69  Resp: 20 16 16 14   Temp: 100.3 F (37.9 C)   99.8 F (37.7 C)  TempSrc: Axillary   Axillary  SpO2: 99% 99% 96% 98%  Weight:      Height:       CBC:  Recent Labs  Lab 12/30/20 1720 12/31/20 0240 01/04/21 0439 01/05/21 0402  WBC 15.1*   < > 14.8* 20.5*  NEUTROABS 13.3*  --   --   --   HGB 14.5   < > 12.4* 13.0  HCT 45.6   < > 39.4 41.8  MCV 74.4*   < > 75.6* 76.4*  PLT 243   < > 207 220   < > = values in this interval not displayed.   Basic Metabolic Panel:  Recent Labs  Lab 01/01/21 1653 01/02/21 0448 01/03/21 0458 01/03/21 0857 01/04/21 0439 01/05/21 0402  NA  --  138 144   < > 148* 153*  K  --  3.1* 3.6   < > 3.8 3.6  CL  --  107 114*  --  120* 123*  CO2  --  20* 19*  --  21* 21*  GLUCOSE  --  165* 183*  --  160* 194*  BUN  --  40* 46*  --  47* 42*  CREATININE  --  2.46* 2.06*  --  2.10* 2.04*  CALCIUM  --  8.6* 8.5*  --  8.6* 9.0  MG 2.2 2.4 2.4  --   --   --   PHOS 4.4 6.3*  --   --   --   --    < > = values in this interval not displayed.   Lipid Panel:  Recent Labs  Lab 12/31/20 0240 01/01/21 0508  CHOL 175  --   TRIG 481* 574*  HDL 45  --   CHOLHDL 3.9  --   VLDL UNABLE TO CALCULATE IF TRIGLYCERIDE OVER 400 mg/dL  --   LDLCALC UNABLE TO CALCULATE IF TRIGLYCERIDE OVER 400 mg/dL  --    HgbA1c:  Recent Labs  Lab  12/31/20 0240  HGBA1C 6.3*   Urine Drug Screen:  Recent Labs  Lab 12/31/20 0822  LABOPIA NONE DETECTED  COCAINSCRNUR NONE DETECTED  LABBENZ NONE DETECTED  AMPHETMU NONE DETECTED  THCU NONE DETECTED  LABBARB NONE DETECTED    Alcohol Level No results for input(s): ETH in the last 168 hours.  IMAGING past 24 hours No results found.  PHYSICAL EXAM General:  Patient is a well-developed, well-nourished middle-age African-American male who is intubated but not sedated Ext: no C/C/E. HEENT: intubated, NG tube. Resp: intubated, tachypnic  CV: RRR.  GI: Abdomen soft with active bowel sounds Skin: no rashes.   Neurological: Eyes are partially open but patient does not respond to name, noxious stimuli, or follow commands.  PERRL. Incomplete oculocephalic reflex.  Cough and gag are present.  Has some spontaneous respiratory effort above ventilator settings RUE and RLE nonresponsive to noxious stimuli no response to sternal rub or nailbed pressure LUE and LLE nonresponsive to noxious stimuli. Coordination- Deferred Sensation-Deferred Gait- Deferred  ASSESSMENT/PLAN [name] is a 55 y.o. male with history of HTN (questionable compliance with medications), DM and CKD 3 presenting with headache and altered mental status. 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  He was hypertensive on arrival to 238/160 and was given Cleviprex for BP control.  He was also agitated and required restraints.  Repeat head CT 11/30 shows unchanged IPH with IVH  Head CTs done on 12/1 and 12/3 also demonstrates unchanged IPH with IVH.  MRI/MRA completed on 12/4.  ICH score 2  ICH with IVH:  left basal ganglia ICH with IVH likely secondary to hypertensive emergency with bicerebral multiple subcortical lacunar infarcts as well CT head Large basal ganglia IPH with IVH involving the lateral, third and fourth ventricles with no significant mass effect or midline shift.   Repeat CT 11/30 0116 Unchanged IPH with IVH Repeat CT head 11/30 1629  Stable IPH with IVH, stable mild hydrocephalus 2D Echo EF 50-55%, grade 1 diastolic dysfunction, no atrial level shunt MRI-10/3-Numerous small acute infarcts in bilateral cerebral hemispheres. Minimal involvement of the right superior cerebellum.  Left caudate hemorrhage, intraventricular hemorrhage, and scattered sulcal subarachnoid hemorrhage as seen on recent CT imaging. Associated mild communicating hydrocephalus. Numerous chronic microhemorrhages probably secondary to hypertension. Chronic microvascular ischemic changes MRA head and neck 10/4- Mildly tortuous cervical carotid and vertebral arteries without stenosis. Intracranial hemorrhage and ventricles appears stable from the MRI yesterday. CUS unremarkable LDL 111.4 HgbA1c 6.3 VTE prophylaxis - SCDs aspirin 81 mg daily prior to admission, now on No antithrombotic secondary to IPH Therapy recommendations:  SNF Disposition:  pending  Respiratory distress Intubated this am. CCM on board Tachypnea- PRN fentanyl and Versed ETT exchanged 12/4 due to a cuff leak Precedex for sedation- turned off 12/4  Hypertensive emergency: improving. Home meds:  Amlodipine 10 mg daily, metoprolol succinate 25 mg daily,  Unstable Cardene, still requiring Cardene to maintain BP at goal. SBP goal <160  home amlodipine 10 mg daily and metoprolol to 100 mg BID On Clonidine 0.3 mg q8h Long-term BP goal normotensive  Hyperlipidemia Home meds:  none LDL 111.4, goal < 70 Hold off statin for now due to acute ICH Consider statin at discharge  AKI on CKD Cre 1.83-2.23-2.46 -> 2.10 On IVF and TF Close monitoring I/O CCM on board  Other Stroke Risk Factors Questionable compliance with medications at home  Other Active Problems Hypokalemia 3.1 -> supplement Ileus seen on KUB- fleet enema give 12/1; bowel regimen in place  Hospital day # 7 Patient remains critically ill with  respiratory failure requiring ventilatory support with a poor neurological exam but given aspiration pneumonia and elevated white count has been started on antibiotics and will give him a few more days to see if there is any significant improvement in his neurological exam.  Awaiting family arrival for discussion about goals of care.  He is unlikely to survive without prolonged ventilatory support, tracheostomy, PEG tube and prolonged nursing home care.  Discussed with Dr. Thayer Headings critical care medicine   I have personally obtained history,examined this patient, reviewed notes, independently viewed imaging studies, participated in medical decision making and plan of care.ROS  completed by me personally and pertinent positives fully documented  I have made any additions or clarifications directly to the above note.  Patient's neurological exam remains quite poor but he was intubated just 2 days ago for severe aspiration pneumonia from staff infection.  Recommend continue ongoing ventilatory support and antibiotics as per critical care team.  If neurological exam does not improve significantly next to 3 days prognosis would be very guarded.  No family available at the bedside for discussion today.  Discussed with Dr. Lynetta Mare critical care medicine.This patient is critically ill and at significant risk of neurological worsening, death and care requires constant monitoring of vital signs, hemodynamics,respiratory and cardiac monitoring, extensive review of multiple databases, frequent neurological assessment, discussion with family, other specialists and medical decision making of high complexity.I have made any additions or clarifications directly to the above note.This critical care time does not reflect procedure time, or teaching time or supervisory time of PA/NP/Med Resident etc but could involve care discussion time.  I spent 30 minutes of neurocritical care time  in the care of  this patient.       Antony Contras, MD Medical Director [facility] Stroke Center Pager: [phone] 01/06/2021 1:42 PM    To contact Stroke Continuity provider, please refer to [email or web address] After hours, contact General Neurology

## 2021-01-07 DIAGNOSIS — I610 Nontraumatic intracerebral hemorrhage in hemisphere, subcortical: Secondary | ICD-10-CM | POA: Diagnosis not present

## 2021-01-07 DIAGNOSIS — J988 Other specified respiratory disorders: Secondary | ICD-10-CM | POA: Diagnosis not present

## 2021-01-07 DIAGNOSIS — J690 Pneumonitis due to inhalation of food and vomit: Secondary | ICD-10-CM | POA: Diagnosis not present

## 2021-01-07 DIAGNOSIS — I161 Hypertensive emergency: Secondary | ICD-10-CM | POA: Diagnosis not present

## 2021-01-07 LAB — CBC WITH DIFFERENTIAL/PLATELET
Abs Immature Granulocytes: 0.14 10*3/uL — ABNORMAL HIGH (ref 0.00–0.07)
Basophils Absolute: 0 10*3/uL (ref 0.0–0.1)
Basophils Relative: 0 %
Eosinophils Absolute: 0.2 10*3/uL (ref 0.0–0.5)
Eosinophils Relative: 2 %
HCT: 34.9 % — ABNORMAL LOW (ref 39.0–52.0)
Hemoglobin: 10.8 g/dL — ABNORMAL LOW (ref 13.0–17.0)
Immature Granulocytes: 1 %
Lymphocytes Relative: 10 %
Lymphs Abs: 1.3 10*3/uL (ref 0.7–4.0)
MCH: 23.5 pg — ABNORMAL LOW (ref 26.0–34.0)
MCHC: 30.9 g/dL (ref 30.0–36.0)
MCV: 76 fL — ABNORMAL LOW (ref 80.0–100.0)
Monocytes Absolute: 0.9 10*3/uL (ref 0.1–1.0)
Monocytes Relative: 7 %
Neutro Abs: 9.7 10*3/uL — ABNORMAL HIGH (ref 1.7–7.7)
Neutrophils Relative %: 80 %
Platelets: 259 10*3/uL (ref 150–400)
RBC: 4.59 MIL/uL (ref 4.22–5.81)
RDW: 15.6 % — ABNORMAL HIGH (ref 11.5–15.5)
WBC: 12.2 10*3/uL — ABNORMAL HIGH (ref 4.0–10.5)
nRBC: 0 % (ref 0.0–0.2)

## 2021-01-07 LAB — GLUCOSE, CAPILLARY
Glucose-Capillary: 259 mg/dL — ABNORMAL HIGH (ref 70–99)
Glucose-Capillary: 176 mg/dL — ABNORMAL HIGH (ref 70–99)
Glucose-Capillary: 191 mg/dL — ABNORMAL HIGH (ref 70–99)
Glucose-Capillary: 231 mg/dL — ABNORMAL HIGH (ref 70–99)
Glucose-Capillary: 241 mg/dL — ABNORMAL HIGH (ref 70–99)
Glucose-Capillary: 250 mg/dL — ABNORMAL HIGH (ref 70–99)

## 2021-01-07 LAB — BASIC METABOLIC PANEL
Anion gap: 12 (ref 5–15)
BUN: 32 mg/dL — ABNORMAL HIGH (ref 6–20)
CO2: 19 mmol/L — ABNORMAL LOW (ref 22–32)
Calcium: 8.3 mg/dL — ABNORMAL LOW (ref 8.9–10.3)
Chloride: 121 mmol/L — ABNORMAL HIGH (ref 98–111)
Creatinine, Ser: 1.97 mg/dL — ABNORMAL HIGH (ref 0.61–1.24)
GFR, Estimated: 39 mL/min — ABNORMAL LOW (ref >60)
Glucose, Bld: 291 mg/dL — ABNORMAL HIGH (ref 70–99)
Potassium: 3.4 mmol/L — ABNORMAL LOW (ref 3.5–5.1)
Sodium: 152 mmol/L — ABNORMAL HIGH (ref 135–145)

## 2021-01-07 MED ORDER — HYDROMORPHONE HCL 1 MG/ML IJ SOLN
INTRAMUSCULAR | Status: AC
  Filled 2021-01-07: qty 1

## 2021-01-07 MED ORDER — MINOXIDIL 2.5 MG PO TABS
2.5000 mg | ORAL_TABLET | Freq: Every day | ORAL | Status: DC
  Filled 2021-01-07: qty 1

## 2021-01-07 MED ORDER — LISINOPRIL 20 MG PO TABS
40.0000 mg | ORAL_TABLET | Freq: Every day | ORAL | Status: DC
  Administered 2021-01-07 – 2021-01-10 (×4): 40 mg
  Filled 2021-01-07 (×4): qty 2

## 2021-01-07 MED ORDER — POTASSIUM CHLORIDE 20 MEQ PO PACK
40.0000 meq | PACK | Freq: Once | ORAL | Status: AC
  Administered 2021-01-07: 40 meq
  Filled 2021-01-07: qty 2

## 2021-01-07 MED ORDER — CARVEDILOL 12.5 MG PO TABS
25.0000 mg | ORAL_TABLET | Freq: Two times a day (BID) | ORAL | Status: DC
  Administered 2021-01-07 – 2021-04-29 (×221): 25 mg
  Filled 2021-01-07 (×226): qty 2

## 2021-01-07 MED ORDER — FREE WATER
200.0000 mL | Status: DC
  Administered 2021-01-07 – 2021-01-12 (×67): 200 mL

## 2021-01-07 MED ORDER — BUSPIRONE HCL 10 MG PO TABS
10.0000 mg | ORAL_TABLET | Freq: Three times a day (TID) | ORAL | Status: DC
  Administered 2021-01-07 – 2021-01-22 (×45): 10 mg
  Filled 2021-01-07 (×45): qty 1

## 2021-01-07 MED ORDER — MINOXIDIL 2.5 MG PO TABS
2.5000 mg | ORAL_TABLET | Freq: Every day | ORAL | Status: DC
Start: 2021-01-07 — End: 2021-01-08
  Administered 2021-01-07: 2.5 mg
  Filled 2021-01-07: qty 1

## 2021-01-07 NOTE — Progress Notes (Addendum)
STROKE TEAM PROGRESS NOTE   INTERVAL HISTORY Patient remains intubated.  Attempted to wean this morning and  only lasted approximately 1 minute before becoming dyssynchronous with the vent.  Attempt to plan a family meeting for this week with both CCM and stroke team.  Patient was febrile overnight and received Tylenol.  Consider cooling blanket if fever does not break. Patient had a neuro change yesterday and a repeat CT scan that showed an unchanged intraventricular hemorrhage and left caudate head intraparenchymal component.  There was no new area of hemorrhage shown on the CT scan.  Vitals:   01/07/21 0345 01/07/21 0400 01/07/21 0409 01/07/21 0500  BP: 138/67 (!) 145/69 (!) 145/69   Pulse: (!) 56 60 61   Resp: 18 19 (!) 22   Temp:  99.4 F (37.4 C)    TempSrc:  Axillary    SpO2: 96% 94% 96%   Weight:    85.2 kg  Height:       CBC:  Recent Labs  Lab 01/05/21 0402 01/07/21 0413  WBC 20.5* 12.2*  NEUTROABS  --  9.7*  HGB 13.0 10.8*  HCT 41.8 34.9*  MCV 76.4* 76.0*  PLT 220 [id number]   Basic Metabolic Panel:  Recent Labs  Lab 01/01/21 1653 01/02/21 0448 01/03/21 0458 01/03/21 0857 01/05/21 0402 01/07/21 0413  NA  --  138 144   < > 153* 152*  K  --  3.1* 3.6   < > 3.6 3.4*  CL  --  107 114*   < > 123* 121*  CO2  --  20* 19*   < > 21* 19*  GLUCOSE  --  165* 183*   < > 194* 291*  BUN  --  40* 46*   < > 42* 32*  CREATININE  --  2.46* 2.06*   < > 2.04* 1.97*  CALCIUM  --  8.6* 8.5*   < > 9.0 8.3*  MG 2.2 2.4 2.4  --   --   --   PHOS 4.4 6.3*  --   --   --   --    < > = values in this interval not displayed.   Lipid Panel:  Recent Labs  Lab 01/01/21 0508  TRIG 574*   HgbA1c:  No results for input(s): HGBA1C in the last 168 hours.  Urine Drug Screen:  Recent Labs  Lab 12/31/20 0822  LABOPIA NONE DETECTED  COCAINSCRNUR NONE DETECTED  LABBENZ NONE DETECTED  AMPHETMU NONE DETECTED  THCU NONE DETECTED  LABBARB NONE DETECTED    Alcohol Level No results for  input(s): ETH in the last 168 hours.  IMAGING past 24 hours CT HEAD WO CONTRAST (5MM)  Result Date: 01/06/2021 CLINICAL DATA:  Hemorrhage follow-up EXAM: CT HEAD WITHOUT CONTRAST TECHNIQUE: Contiguous axial images were obtained from the base of the skull through the vertex without intravenous contrast. COMPARISON:  None. FINDINGS: Brain: Intraventricular hemorrhage is unchanged. Intraparenchymal component in the left caudate head is also unchanged. No new site of hemorrhage. No mass effect. There is periventricular hypoattenuation compatible with chronic microvascular disease. Vascular: No abnormal hyperdensity of the major intracranial arteries or dural venous sinuses. No intracranial atherosclerosis. Skull: The visualized skull base, calvarium and extracranial soft tissues are normal. Sinuses/Orbits: Moderate paranasal sinus mucosal thickening. The orbits are normal. IMPRESSION: Unchanged intraventricular hemorrhage and left caudate head intraparenchymal component. No new site of hemorrhage. Electronically Signed   By: Ulyses Jarred M.D.   On: 01/06/2021 20:58    PHYSICAL  EXAM General:  Patient is a well-developed, well-nourished middle-age African-American male who is intubated but not sedated Ext: no C/C/E. HEENT: intubated, NG tube. Resp: intubated, tachypnic  CV: RRR.  GI: Abdomen soft with active bowel sounds Skin: no rashes.   Neurological: Eyes are partially open but patient does not respond to name, noxious stimuli, or follow commands.   PERRL. Incomplete oculocephalic reflex.  Ocular bobbing noted. Cough and gag are present.  Has some spontaneous respiratory effort above ventilator settings.  Easily becomes dyssynchronous with the ventilator. RUE and RLE nonresponsive to noxious stimuli no response to sternal rub or nailbed pressure LUE and LLE nonresponsive to noxious stimuli. Coordination- Deferred Sensation-Deferred Gait- Deferred  ASSESSMENT/PLAN Stephen Delgado is a 55  y.o. male with history of HTN (questionable compliance with medications), DM and CKD 3 presenting with headache and altered mental status. 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  He was hypertensive on arrival to 238/160 and was given Cleviprex for BP control.  He was also agitated and required restraints.  Repeat head CT 11/30 shows unchanged IPH with IVH  Head CTs done on 12/1 and 12/3 also demonstrates unchanged IPH with IVH.  MRI/MRA completed on 12/4.  ICH score 2  ICH with IVH:  left basal ganglia ICH with IVH likely secondary to hypertensive emergency with bicerebral multiple subcortical lacunar infarcts as well CT head Large basal ganglia IPH with IVH involving the lateral, third and fourth ventricles with no significant mass effect or midline shift.  Repeat CT 11/30 0116 Unchanged IPH with IVH Repeat CT head 11/30 1629  Stable IPH with IVH, stable mild hydrocephalus Repeat CT Head 12/6 - Unchanged intraventricular hemorrhage and left caudate head intraparenchymal component. No new site of hemorrhage. 2D Echo EF [id number], grade 1 diastolic dysfunction, no atrial level shunt MRI-10/3-Numerous small acute infarcts in bilateral cerebral hemispheres. Minimal involvement of the right superior cerebellum.  Left caudate hemorrhage, intraventricular hemorrhage, and scattered sulcal subarachnoid hemorrhage as seen on recent CT imaging. Associated mild communicating hydrocephalus. Numerous chronic microhemorrhages probably secondary to hypertension. Chronic microvascular ischemic changes MRA head and neck 10/4- Mildly tortuous cervical carotid and vertebral arteries without stenosis. Intracranial hemorrhage and ventricles appears stable from the MRI yesterday. CUS unremarkable LDL 111.4 HgbA1c 6.3 VTE prophylaxis - SCDs aspirin 81 mg daily prior to admission, now on No antithrombotic secondary to Experiment Therapy recommendations:  SNF Disposition:  pending  Respiratory  distress Intubated this am. CCM on board Tachypnea- PRN fentanyl and Versed ETT exchanged 12/4 due to a cuff leak Precedex for sedation- turned off 12/4  Hypertensive emergency: improving. Home meds:  Amlodipine 10 mg daily, metoprolol succinate 25 mg daily,  Unstable Cardene, still requiring Cardene to maintain BP at goal. SBP goal <160 Home - amlodipine 10 mg daily and metoprolol to 100 mg BID On Clonidine 0.3 mg q8h Long-term BP goal normotensive  Hyperlipidemia Home meds:  none LDL 111.4, goal < 70 Hold off statin for now due to acute ICH Consider statin at discharge         AKI on CKD Cre 1.83-2.23-2.46 -> 2.10->2.04->1.97 On IVF and TF Close monitoring I/O CCM on board        Other Stroke Risk Factors Questionable compliance with medications at home        Other Active Problems Code status - full code Hypokalemia 3.1 -> supplement->3.4 -> supplemented further - (daily labs) Hypernatremia- 153->152 Daily labs Ileus  seen on KUB- fleet enema give 12/1; bowel regimen in place Aspiration pneumonia -> Ancef started 12/5 per CCM - temp 99.4 - WBCs 20.5->12.2 Downward gaze, concerning for hydrocephalus evaluated by Dr Roda Shutters evening of 12/6.  Sat head CT ordered which showed no acute change.  Rechecked patient at 9:30 PM, patient still had rightward gaze but not much downward gaze. There was right upper gaze likely due to patient left pontine infarct. CT Head 12/ 6 - Unchanged intraventricular hemorrhage and left caudate head intraparenchymal component. No new site of hemorrhage. Na - 138->144->153->152  (free water 200 ml Q 4 hours) Glucose - 165->183->194->291 (SSI) Anemia - Hb 13.0->10.8  Hospital day # 8  Patient seen and examined by NP/APP with MD. MD to update note as needed.   Elmer Picker, DNP, FNP-BC [facility] Pager: [phone]  STROKE MD NOTE :.I have personally obtained history,examined this patient, reviewed notes, independently viewed  imaging studies, participated in medical decision making and plan of care.ROS completed by me personally and pertinent positives fully documented  I have made any additions or clarifications directly to the above note. Agree with note above.  Patient neurological exam remains quite poor and he remains on full ventilatory support and is unable to be weaned due to respiratory failure.  Remains febrile his white count is improved with antibiotics but neurological exam remains comatose with no voluntary or involuntary movements.  I had a long discussion at bedside with the patient's wife as well as cousin who is a respiratory therapist.  His poor prognosis and family needs to make a decision about goals of care.  We will need a few days to decide whether they want to proceed with tracheostomy, PEG tube and nursing home placement versus withdrawal of care.  Discussed with Dr. Lynnell Catalan This patient is critically ill and at significant risk of neurological worsening, death and care requires constant monitoring of vital signs, hemodynamics,respiratory and cardiac monitoring, extensive review of multiple databases, frequent neurological assessment, discussion with family, other specialists and medical decision making of high complexity.I have made any additions or clarifications directly to the above note.This critical care time does not reflect procedure time, or teaching time or supervisory time of PA/NP/Med Resident etc but could involve care discussion time.  I spent 50 minutes of neurocritical care time  in the care of  this patient.     Stephen Heady, MD Medical Director [facility] Stroke Center Pager: [phone] 01/07/2021 2:39 PM   To contact Stroke Continuity provider, please refer to [email or web address]. After hours, contact General Neurology

## 2021-01-07 NOTE — Progress Notes (Addendum)
Inpatient Diabetes Program Recommendations  AACE/ADA: New Consensus Statement on Inpatient Glycemic Control (2015)  Target Ranges:  Prepandial:   less than 140 mg/dL      Peak postprandial:   less than 180 mg/dL (1-2 hours)      Critically ill patients:  140 - 180 mg/dL   Lab Results  Component Value Date   GLUCAP 176 (H) 01/07/2021   HGBA1C 6.3 (H) 12/31/2020    Review of Glycemic Control  Latest Reference Range & Units 01/06/21 15:20 01/06/21 19:29 01/06/21 23:28 01/07/21 03:12 01/07/21 07:51  Glucose-Capillary 70 - 99 mg/dL 737 (H) 366 (H) 815 (H) 259 (H) 176 (H)   Diabetes history: DM 2 Outpatient Diabetes medications:  None noted Current orders for Inpatient glycemic control:  Novolog sensitive q 4 hours Novolog 3 units q 4 hours  Inpatient Diabetes Program Recommendations:    May consider adding Levemir 5 units bid.   Thanks,  Beryl Meager, RN, BC-ADM Inpatient Diabetes Coordinator Pager [phone]  (8a-5p)

## 2021-01-07 NOTE — Progress Notes (Signed)
K+ 3.4 Replaced per protocol  

## 2021-01-07 NOTE — Progress Notes (Signed)
NAME:  Stephen Delgado, MRN:  [id number], DOB:  [birth year], LOS: 8 ADMISSION DATE:  12/30/2020, CONSULTATION DATE:  01/01/21 REFERRING MD:  xu (stroke), CHIEF COMPLAINT:  respiratory failure   History of Present Illness:  55 yo male presented with headache, nausea and altered mental status.  CT head showed acute Lt ICH with IVH likely related to hypertensive emergency (BP 195/116).  Had persistent hypertension and concern for airway protection, and PCCM asked to assist with ICU management.  Pertinent  Medical History  HTN, DM type 2, CKD 3a, OSA  Significant Hospital Events:  11/29 Admit, start cleviprex 12/01 PCCM consulted; changed from cleviprex to cardene due to elevated triglycerides 12/03 intubated; episode of vomiting >> tube feeds held 12/04 resume trickle tube feeds  Studies:  CT head 11/29 >> large amount of IVH involving lateral/3rd/4th ventricles, ICH in Lt BG, chronic microvascular ischemic changes Echo 11/30 >> EF 50 to 55%, mod LVH, grade 1 DD, ascending aorta 37 mm EEG 12/01 >> continuous generalized slowing MRI brain 12/03 >> numerous small acute infarcts in b/l cerebral hemispheres, Lt caudate hemorrhage, IVH, scattered sulcal SAH, mild communicating hydrocephalus 12/5 Started on free water for hypernatremia. Cefazolin for MSSA pneumonia.  Interim History / Subjective:  Still febrile. Still on Cardene for hypertension.   Objective   Blood pressure (!) 158/74, pulse (!) 59, temperature 99.4 F (37.4 C), temperature source Axillary, resp. rate 20, height 6' (1.829 m), weight 85.2 kg, SpO2 97 %.    Vent Mode: PRVC FiO2 (%):  [30 %] 30 % Set Rate:  [16 bmp] 16 bmp Vt Set:  [620 mL] 620 mL PEEP:  [5 cmH20] 5 cmH20 Plateau Pressure:  [14 cmH20-18 cmH20] 14 cmH20   Intake/Output Summary (Last 24 hours) at 01/07/2021 [id number] Last data filed at 01/07/2021 0700 Gross per 24 hour  Intake 5595.74 ml  Output 1375 ml  Net 4220.74 ml    Filed Weights   12/31/20 1435  01/07/21 0500  Weight: 85.2 kg 85.2 kg    Examination:  General - off sedation, on Cardene Eyes - pupils midpoint ENT - ETT in place Cardiac - regular rate/rhythm, no murmur Chest - equal breath sounds b/l, no wheezing or rales Abdomen - soft, non tender, + bowel sounds Extremities - no cyanosis, clubbing, or edema Skin - no rashes Neuro - extensor posturing to pain .   Ancillary tests personally reviewed:    CXR 12/5: ETT in good position with clear lung fields.  Hypernatremia to 152 MSSA in sputum Leukocytosis to 20.5 MRI shows multiple small strokes consistent with hypertensive disease.  MRI shows no vascular abnormalities to explain hemorrhage. 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 Acute hypoxic respiratory failure due to Staphylococcus aureus aspiration pneumonia Hypernatremia due to free water restriction and insensitive losses. Stage IIIa CKD Type 2 diabetes   Plan:  -Optimize enteral antihypertensive regimen and wean nicardipine to off.  Keep systolic blood pressure less than 160.  Long-term target less than 140. Increase lisinopril to carvedilol to maximum doses. Add minoxidil. Hopefully can then wean clonidine as may be contributing to sedation.  -Taper antibiotics to cefazolin and treat for 7 days. - Adding buspirone for fever control.  - Increase free water flushes to correct hypernatremia. -Continue current insulin regimen. - Long term prognosis is guarded given persistently poor mental status and stroke burden.  Best Practice (right click and "Reselect all SmartList Selections" daily)   Diet/type: tubefeeds  DVT prophylaxis: prophylactic heparin  GI prophylaxis: PPI Lines: N/A Foley:  Condom catheter Code Status:  full code Family: Family updated at bedside  12/5.  CRITICAL CARE Performed by: Lynnell Catalan   Total critical care time: 35 minutes  Critical care time was exclusive of separately billable procedures and treating other patients.  Critical care was necessary to treat or prevent imminent or life-threatening deterioration.  Critical care was time spent personally by me on the following activities: development of treatment plan with patient and/or surrogate as well as nursing, discussions with consultants, evaluation of patient's response to treatment, examination of patient, obtaining history from patient or surrogate, ordering and performing treatments and interventions, ordering and review of laboratory studies, ordering and review of radiographic studies, pulse oximetry, re-evaluation of patient's condition and participation in multidisciplinary rounds.  Lynnell Catalan, MD [facility] ICU Physician [facility] Critical [facility]  Pager: [phone] Mobile: [phone] After hours: [phone].  01/07/2021, 8:08 AM

## 2021-01-08 ENCOUNTER — Inpatient Hospital Stay (HOSPITAL_COMMUNITY): Payer: 59

## 2021-01-08 DIAGNOSIS — J690 Pneumonitis due to inhalation of food and vomit: Secondary | ICD-10-CM | POA: Diagnosis not present

## 2021-01-08 DIAGNOSIS — I615 Nontraumatic intracerebral hemorrhage, intraventricular: Secondary | ICD-10-CM | POA: Diagnosis not present

## 2021-01-08 DIAGNOSIS — Z66 Do not resuscitate: Secondary | ICD-10-CM | POA: Diagnosis not present

## 2021-01-08 DIAGNOSIS — Z515 Encounter for palliative care: Secondary | ICD-10-CM | POA: Diagnosis not present

## 2021-01-08 DIAGNOSIS — I161 Hypertensive emergency: Secondary | ICD-10-CM | POA: Diagnosis not present

## 2021-01-08 DIAGNOSIS — Z20822 Contact with and (suspected) exposure to covid-19: Secondary | ICD-10-CM | POA: Diagnosis not present

## 2021-01-08 DIAGNOSIS — I610 Nontraumatic intracerebral hemorrhage in hemisphere, subcortical: Secondary | ICD-10-CM | POA: Diagnosis not present

## 2021-01-08 DIAGNOSIS — J988 Other specified respiratory disorders: Secondary | ICD-10-CM | POA: Diagnosis not present

## 2021-01-08 LAB — CBC WITH DIFFERENTIAL/PLATELET
Abs Immature Granulocytes: 0.12 10*3/uL — ABNORMAL HIGH (ref 0.00–0.07)
Basophils Absolute: 0 10*3/uL (ref 0.0–0.1)
Basophils Relative: 0 %
Eosinophils Absolute: 0.2 10*3/uL (ref 0.0–0.5)
Eosinophils Relative: 2 %
HCT: 37.4 % — ABNORMAL LOW (ref 39.0–52.0)
Hemoglobin: 11.6 g/dL — ABNORMAL LOW (ref 13.0–17.0)
Immature Granulocytes: 1 %
Lymphocytes Relative: 12 %
Lymphs Abs: 1.4 10*3/uL (ref 0.7–4.0)
MCH: 23.9 pg — ABNORMAL LOW (ref 26.0–34.0)
MCHC: 31 g/dL (ref 30.0–36.0)
MCV: 77.1 fL — ABNORMAL LOW (ref 80.0–100.0)
Monocytes Absolute: 0.7 10*3/uL (ref 0.1–1.0)
Monocytes Relative: 6 %
Neutro Abs: 9.6 10*3/uL — ABNORMAL HIGH (ref 1.7–7.7)
Neutrophils Relative %: 79 %
Platelets: 197 10*3/uL (ref 150–400)
RBC: 4.85 MIL/uL (ref 4.22–5.81)
RDW: 15.8 % — ABNORMAL HIGH (ref 11.5–15.5)
WBC: 12 10*3/uL — ABNORMAL HIGH (ref 4.0–10.5)
nRBC: 0 % (ref 0.0–0.2)

## 2021-01-08 LAB — D-DIMER, QUANTITATIVE: D-Dimer, Quant: 2.13 ug/mL-FEU — ABNORMAL HIGH (ref 0.00–0.50)

## 2021-01-08 LAB — BASIC METABOLIC PANEL
Anion gap: 12 (ref 5–15)
BUN: 34 mg/dL — ABNORMAL HIGH (ref 6–20)
CO2: 15 mmol/L — ABNORMAL LOW (ref 22–32)
Calcium: 8.5 mg/dL — ABNORMAL LOW (ref 8.9–10.3)
Chloride: 126 mmol/L — ABNORMAL HIGH (ref 98–111)
Creatinine, Ser: 2.06 mg/dL — ABNORMAL HIGH (ref 0.61–1.24)
GFR, Estimated: 37 mL/min — ABNORMAL LOW (ref >60)
Glucose, Bld: 281 mg/dL — ABNORMAL HIGH (ref 70–99)
Potassium: 4.8 mmol/L (ref 3.5–5.1)
Sodium: 153 mmol/L — ABNORMAL HIGH (ref 135–145)

## 2021-01-08 LAB — GLUCOSE, CAPILLARY
Glucose-Capillary: 224 mg/dL — ABNORMAL HIGH (ref 70–99)
Glucose-Capillary: 232 mg/dL — ABNORMAL HIGH (ref 70–99)
Glucose-Capillary: 260 mg/dL — ABNORMAL HIGH (ref 70–99)
Glucose-Capillary: 305 mg/dL — ABNORMAL HIGH (ref 70–99)
Glucose-Capillary: 294 mg/dL — ABNORMAL HIGH (ref 70–99)
Glucose-Capillary: 305 mg/dL — ABNORMAL HIGH (ref 70–99)

## 2021-01-08 LAB — TROPONIN I (HIGH SENSITIVITY): Troponin I (High Sensitivity): 95 ng/L — ABNORMAL HIGH (ref <18)

## 2021-01-08 MED ORDER — ALBUTEROL SULFATE (2.5 MG/3ML) 0.083% IN NEBU
2.5000 mg | INHALATION_SOLUTION | RESPIRATORY_TRACT | Status: DC | PRN
  Administered 2021-01-08: 2.5 mg via RESPIRATORY_TRACT
  Filled 2021-01-08: qty 3

## 2021-01-08 MED ORDER — FENTANYL CITRATE PF 50 MCG/ML IJ SOSY
50.0000 ug | PREFILLED_SYRINGE | INTRAMUSCULAR | Status: DC | PRN
  Administered 2021-01-08 – 2021-01-11 (×2): 100 ug via INTRAVENOUS
  Filled 2021-01-08: qty 2
  Filled 2021-01-08 (×3): qty 1
  Filled 2021-01-08 (×2): qty 2

## 2021-01-08 MED ORDER — PROPOFOL 1000 MG/100ML IV EMUL
5.0000 ug/kg/min | INTRAVENOUS | Status: DC
  Administered 2021-01-08: 15 ug/kg/min via INTRAVENOUS
  Administered 2021-01-08 – 2021-01-09 (×2): 40 ug/kg/min via INTRAVENOUS
  Administered 2021-01-09: 30 ug/kg/min via INTRAVENOUS
  Administered 2021-01-09: 20 ug/kg/min via INTRAVENOUS
  Administered 2021-01-10: 30 ug/kg/min via INTRAVENOUS
  Filled 2021-01-08 (×6): qty 100

## 2021-01-08 MED ORDER — HYDRALAZINE HCL 20 MG/ML IJ SOLN
10.0000 mg | Freq: Four times a day (QID) | INTRAMUSCULAR | Status: DC | PRN
Start: 2021-01-08 — End: 2021-05-22
  Administered 2021-01-22 (×2): 20 mg via INTRAVENOUS
  Administered 2021-01-23: 05:00:00 40 mg via INTRAVENOUS
  Administered 2021-01-24: 01:00:00 10 mg via INTRAVENOUS
  Administered 2021-01-25 – 2021-03-23 (×6): 20 mg via INTRAVENOUS
  Administered 2021-05-05 – 2021-05-07 (×5): 10 mg via INTRAVENOUS
  Administered 2021-05-08: 20 mg via INTRAVENOUS
  Administered 2021-05-08: 10 mg via INTRAVENOUS
  Administered 2021-05-10: 20 mg via INTRAVENOUS
  Administered 2021-05-10: 30 mg via INTRAVENOUS
  Administered 2021-05-11 – 2021-05-17 (×4): 20 mg via INTRAVENOUS
  Administered 2021-05-21: 10 mg via INTRAVENOUS
  Filled 2021-01-08: qty 1
  Filled 2021-01-08: qty 2
  Filled 2021-01-08 (×7): qty 1
  Filled 2021-01-08: qty 2
  Filled 2021-01-08 (×16): qty 1

## 2021-01-08 MED ORDER — MINOXIDIL 10 MG PO TABS
10.0000 mg | ORAL_TABLET | Freq: Every day | ORAL | Status: DC
  Administered 2021-01-08 – 2021-01-19 (×10): 10 mg
  Filled 2021-01-08 (×12): qty 1

## 2021-01-08 MED ORDER — DEXMEDETOMIDINE HCL IN NACL 400 MCG/100ML IV SOLN
0.4000 ug/kg/h | INTRAVENOUS | Status: DC
  Administered 2021-01-08: 0.6 ug/kg/h via INTRAVENOUS
  Administered 2021-01-08: 0.7 ug/kg/h via INTRAVENOUS
  Administered 2021-01-09 – 2021-01-10 (×3): 0.4 ug/kg/h via INTRAVENOUS
  Administered 2021-01-11: 0.5 ug/kg/h via INTRAVENOUS
  Filled 2021-01-08 (×8): qty 100

## 2021-01-08 MED ORDER — METHYLPREDNISOLONE SODIUM SUCC 125 MG IJ SOLR
INTRAMUSCULAR | Status: AC
  Administered 2021-01-08: 125 mg
  Filled 2021-01-08: qty 2

## 2021-01-08 MED ORDER — PROPOFOL 1000 MG/100ML IV EMUL
INTRAVENOUS | Status: AC
  Filled 2021-01-08: qty 100

## 2021-01-08 MED ORDER — INSULIN DETEMIR 100 UNIT/ML ~~LOC~~ SOLN
10.0000 [IU] | Freq: Two times a day (BID) | SUBCUTANEOUS | Status: DC
  Administered 2021-01-08 (×2): 10 [IU] via SUBCUTANEOUS
  Filled 2021-01-08 (×4): qty 0.1

## 2021-01-08 MED ORDER — INSULIN ASPART 100 UNIT/ML IJ SOLN
0.0000 [IU] | INTRAMUSCULAR | Status: DC
  Administered 2021-01-08: 8 [IU] via SUBCUTANEOUS
  Administered 2021-01-08: 11 [IU] via SUBCUTANEOUS
  Administered 2021-01-08: 5 [IU] via SUBCUTANEOUS
  Administered 2021-01-09 (×2): 11 [IU] via SUBCUTANEOUS
  Administered 2021-01-09: 8 [IU] via SUBCUTANEOUS
  Administered 2021-01-09 (×3): 11 [IU] via SUBCUTANEOUS
  Administered 2021-01-10 (×3): 3 [IU] via SUBCUTANEOUS
  Administered 2021-01-10: 5 [IU] via SUBCUTANEOUS
  Administered 2021-01-10: 3 [IU] via SUBCUTANEOUS
  Administered 2021-01-10: 8 [IU] via SUBCUTANEOUS
  Administered 2021-01-11 (×3): 3 [IU] via SUBCUTANEOUS
  Administered 2021-01-11: 5 [IU] via SUBCUTANEOUS
  Administered 2021-01-11: 3 [IU] via SUBCUTANEOUS
  Administered 2021-01-11 – 2021-01-12 (×3): 5 [IU] via SUBCUTANEOUS
  Administered 2021-01-12 (×3): 3 [IU] via SUBCUTANEOUS
  Administered 2021-01-12: 5 [IU] via SUBCUTANEOUS
  Administered 2021-01-13 (×4): 2 [IU] via SUBCUTANEOUS
  Administered 2021-01-13 (×2): 3 [IU] via SUBCUTANEOUS
  Administered 2021-01-13: 2 [IU] via SUBCUTANEOUS
  Administered 2021-01-14: 16:00:00 3 [IU] via SUBCUTANEOUS
  Administered 2021-01-14: 08:00:00 2 [IU] via SUBCUTANEOUS
  Administered 2021-01-14 (×3): 3 [IU] via SUBCUTANEOUS
  Administered 2021-01-15: 2 [IU] via SUBCUTANEOUS
  Administered 2021-01-15: 3 [IU] via SUBCUTANEOUS

## 2021-01-08 MED ORDER — FENTANYL CITRATE PF 50 MCG/ML IJ SOSY
100.0000 ug | PREFILLED_SYRINGE | Freq: Once | INTRAMUSCULAR | Status: AC
  Administered 2021-01-08: 100 ug via INTRAVENOUS

## 2021-01-08 NOTE — Progress Notes (Signed)
NAME:  Stephen Delgado, MRN:  [id number], DOB:  1965/04/18, LOS: 9 ADMISSION DATE:  12/30/2020, CONSULTATION DATE:  01/01/21 REFERRING MD:  xu (stroke), CHIEF COMPLAINT:  respiratory failure   History of Present Illness:  55 yo male presented with headache, nausea and altered mental status.  CT head showed acute Lt ICH with IVH likely related to hypertensive emergency (BP 195/116).  Had persistent hypertension and concern for airway protection, and PCCM asked to assist with ICU management.  Pertinent  Medical History  HTN, DM type 2, CKD 3a, OSA  Significant Hospital Events:  11/29 Admit, start cleviprex 12/01 PCCM consulted; changed from cleviprex to cardene due to elevated triglycerides 12/03 intubated; episode of vomiting >> tube feeds held 12/04 resume trickle tube feeds 12/06 remains minimally responsive. Tolerating SBT.   Studies:  CT head 11/29 >> large amount of IVH involving lateral/3rd/4th ventricles, ICH in Lt BG, chronic microvascular ischemic changes Echo 11/30 >> EF 50 to 55%, mod LVH, grade 1 DD, ascending aorta 37 mm EEG 12/01 >> continuous generalized slowing MRI brain 12/03 >> numerous small acute infarcts in b/l cerebral hemispheres, Lt caudate hemorrhage, IVH, scattered sulcal SAH, mild communicating hydrocephalus 12/5 Started on free water for hypernatremia. Cefazolin for MSSA pneumonia.  Interim History / Subjective:  Still low grade febrile. Still on Cardene for hypertension.   Objective   Blood pressure (!) 144/68, pulse (!) 54, temperature (!) 100.6 F (38.1 C), temperature source Axillary, resp. rate 17, height 6' (1.829 m), weight 85.2 kg, SpO2 98 %.    Vent Mode: PRVC FiO2 (%):  [30 %] 30 % Set Rate:  [16 bmp] 16 bmp Vt Set:  [620 mL] 620 mL PEEP:  [5 cmH20] 5 cmH20 Plateau Pressure:  [11 cmH20-17 cmH20] 16 cmH20   Intake/Output Summary (Last 24 hours) at 01/08/2021 0755 Last data filed at 01/08/2021 0600 Gross per 24 hour  Intake 6559.98 ml   Output 2576 ml  Net 3983.98 ml    Filed Weights   12/31/20 1435 01/07/21 0500  Weight: 85.2 kg 85.2 kg    Examination:  General - off sedation, on Cardene Eyes - pupils midpoint ENT - ETT in place Cardiac - regular rate/rhythm, no murmur Chest - equal breath sounds b/l, no wheezing or rales Abdomen - soft, non tender, + bowel sounds Extremities - no cyanosis, clubbing, or edema Skin - no rashes Neuro - extensor posturing to pain .   Ancillary tests personally reviewed:    CXR 12/5: ETT in good position with clear lung fields.  Hypernatremia to 153 Remains hyperglycemic.  MSSA in sputum Leukocytosis improving to 12.0 MRI shows multiple small strokes consistent with hypertensive disease.  MRI shows no vascular abnormalities to explain hemorrhage.  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 Acute hypoxic respiratory failure due to Staphylococcus aureus aspiration pneumonia Hypernatremia due to free water restriction and insensitive losses. Stage IIIa CKD Type 2 diabetes  Plan:  -Optimize enteral antihypertensive regimen and wean nicardipine to off.  Keep systolic blood pressure less than 160.  Long-term target less than 140. Increase lisinopril to carvedilol to maximum doses. Increase minoxidil. Hopefully can then wean clonidine as may be contributing to sedation.  -Taper antibiotics to cefazolin and treat for 7 days. - Adding buspirone for fever control.  - Increase free water flushes to correct hypernatremia. - Add insulin detemir.  -  Long term prognosis is guarded given persistently poor mental status and stroke burden.   Best Practice (right click and "Reselect all SmartList Selections" daily)   Diet/type: tubefeeds  DVT prophylaxis: prophylactic heparin  GI prophylaxis: PPI Lines:  N/A Foley:  Condom catheter Code Status:  full code Family: Family updated at bedside 12/5.  CRITICAL CARE Performed by: Lynnell Catalan   Total critical care time: 35 minutes  Critical care time was exclusive of separately billable procedures and treating other patients.  Critical care was necessary to treat or prevent imminent or life-threatening deterioration.  Critical care was time spent personally by me on the following activities: development of treatment plan with patient and/or surrogate as well as nursing, discussions with consultants, evaluation of patient's response to treatment, examination of patient, obtaining history from patient or surrogate, ordering and performing treatments and interventions, ordering and review of laboratory studies, ordering and review of radiographic studies, pulse oximetry, re-evaluation of patient's condition and participation in multidisciplinary rounds.  Lynnell Catalan, MD [facility] ICU Physician [facility]  Pager: [phone] Mobile: [phone] After hours: [phone].  01/08/2021, 7:55 AM

## 2021-01-08 NOTE — Progress Notes (Signed)
STROKE TEAM PROGRESS NOTE   INTERVAL HISTORY Patient remains intubated.  Attempted to wean this morning and  only lasted 2 hrs before becoming dyssynchronous with the vent.  Attempt to plan a family meeting  soon with both CCM and stroke team.   Neurological exam remains poor Is unchanged with patient minimally responsive.  Blood pressure still requiring Cleviprex drip though oral medications have been steadily increasing minoxidil has been added.  No family at the bedside Vitals:   01/08/21 1029 01/08/21 1100 01/08/21 1154 01/08/21 1200  BP: (!) 155/86 (!) 142/68 (!) 149/71 (!) 152/81  Pulse: 66 (!) 55 (!) 57 (!) 55  Resp: (!) 26 15 (!) 21 16  Temp:    99.5 F (37.5 C)  TempSrc:    Axillary  SpO2: 100% 100% 98% 100%  Weight:      Height:       CBC:  Recent Labs  Lab 01/07/21 0413 01/08/21 0146  WBC 12.2* 12.0*  NEUTROABS 9.7* 9.6*  HGB 10.8* 11.6*  HCT 34.9* 37.4*  MCV 76.0* 77.1*  PLT 259 [id number]   Basic Metabolic Panel:  Recent Labs  Lab 01/01/21 1653 01/01/21 1653 01/02/21 0448 01/03/21 0458 01/03/21 0857 01/07/21 0413 01/08/21 0146  NA  --    < > 138 144   < > 152* 153*  K  --    < > 3.1* 3.6   < > 3.4* 4.8  CL  --    < > 107 114*   < > 121* 126*  CO2  --    < > 20* 19*   < > 19* 15*  GLUCOSE  --    < > 165* 183*   < > 291* 281*  BUN  --    < > 40* 46*   < > 32* 34*  CREATININE  --    < > 2.46* 2.06*   < > 1.97* 2.06*  CALCIUM  --    < > 8.6* 8.5*   < > 8.3* 8.5*  MG 2.2  --  2.4 2.4  --   --   --   PHOS 4.4  --  6.3*  --   --   --   --    < > = values in this interval not displayed.   Lipid Panel:  No results for input(s): CHOL, TRIG, HDL, CHOLHDL, VLDL, LDLCALC in the last 168 hours.  HgbA1c:  No results for input(s): HGBA1C in the last 168 hours.  Urine Drug Screen:  No results for input(s): LABOPIA, COCAINSCRNUR, LABBENZ, AMPHETMU, THCU, LABBARB in the last 168 hours.   Alcohol Level No results for input(s): ETH in the last 168 hours.  IMAGING past  24 hours No results found.  PHYSICAL EXAM General:  Patient is a well-developed, well-nourished middle-age African-American male who is intubated but not sedated Ext: no C/C/E. HEENT: intubated, NG tube. Resp: intubated, tachypnic  CV: RRR.  GI: Abdomen soft with active bowel sounds Skin: no rashes.   Neurological: Eyes are partially open but patient does not respond to name, noxious stimuli, or follow commands.   PERRL. Incomplete oculocephalic reflex.  Ocular bobbing noted. Cough and gag are present.  Has some spontaneous respiratory effort above ventilator settings.  Easily becomes dyssynchronous with the ventilator. RUE and RLE nonresponsive to noxious stimuli no response to sternal rub or nailbed pressure LUE and LLE nonresponsive to noxious stimuli. Coordination- Deferred Sensation-Deferred Gait- Deferred  ASSESSMENT/PLAN Mr. CHANDAN BEAULIEU is a 55 y.o.  male with history of HTN (questionable compliance with medications), DM and CKD 3 presenting with headache and altered mental status. 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  He was hypertensive on arrival to 238/160 and was given Cleviprex for BP control.  He was also agitated and required restraints.  Repeat head CT 11/30 shows unchanged IPH with IVH  Head CTs done on 12/1 and 12/3 also demonstrates unchanged IPH with IVH.  MRI/MRA completed on 12/4.     ICH with IVH:  left basal ganglia ICH with IVH likely secondary to hypertensive emergency with bicerebral multiple subcortical lacunar infarcts as well CT head Large basal ganglia IPH with IVH involving the lateral, third and fourth ventricles with no significant mass effect or midline shift.  Repeat CT 11/30 0116 Unchanged IPH with IVH Repeat CT head 11/30 1629  Stable IPH with IVH, stable mild hydrocephalus Repeat CT Head 12/6 - Unchanged intraventricular hemorrhage and left caudate head intraparenchymal component. No new site of  hemorrhage. 2D Echo EF [id number], grade 1 diastolic dysfunction, no atrial level shunt MRI-10/3-Numerous small acute infarcts in bilateral cerebral hemispheres. Minimal involvement of the right superior cerebellum.  Left caudate hemorrhage, intraventricular hemorrhage, and scattered sulcal subarachnoid hemorrhage as seen on recent CT imaging. Associated mild communicating hydrocephalus. Numerous chronic microhemorrhages probably secondary to hypertension. Chronic microvascular ischemic changes MRA head and neck 10/4- Mildly tortuous cervical carotid and vertebral arteries without stenosis. Intracranial hemorrhage and ventricles appears stable from the MRI yesterday. CUS unremarkable LDL 111.4 HgbA1c 6.3 VTE prophylaxis - SCDs aspirin 81 mg daily prior to admission, now on No antithrombotic secondary to [place] Therapy recommendations:  SNF Disposition:  pending  Respiratory distress Intubated this am. CCM on board Tachypnea- PRN fentanyl and Versed ETT exchanged 12/4 due to a cuff leak Precedex for sedation- turned off 12/4  Hypertensive emergency: improving. Home meds:  Amlodipine 10 mg daily, metoprolol succinate 25 mg daily,  Unstable Cardene, still requiring Cardene to maintain BP at goal. SBP goal <160 Home - amlodipine 10 mg daily and metoprolol to 100 mg BID On Clonidine 0.3 mg q8h Long-term BP goal normotensive  Hyperlipidemia Home meds:  none LDL 111.4, goal < 70 Hold off statin for now due to acute ICH Consider statin at discharge         AKI on CKD Cre 1.83-2.23-2.46 -> 2.10->2.04->1.97 On IVF and TF Close monitoring I/O CCM on board        Other Stroke Risk Factors Questionable compliance with medications at home        Other Active Problems Code status - full code Hypokalemia 3.1 -> supplement->3.4 -> supplemented further - (daily labs) Hypernatremia- 153->152 Daily labs Ileus seen on KUB- fleet enema give 12/1; bowel regimen in place Aspiration pneumonia ->  Ancef started 12/5 per CCM - temp 99.4 - WBCs 20.5->12.2 Downward gaze, concerning for hydrocephalus evaluated by Dr Erlinda Hong evening of 12/6.  Sat head CT ordered which showed no acute change.  Rechecked patient at 9:30 PM, patient still had rightward gaze but not much downward gaze. There was right upper gaze likely due to patient left pontine infarct. CT Head 12/ 6 - Unchanged intraventricular hemorrhage and left caudate head intraparenchymal component. No new site of hemorrhage. Na - 138->144->153->152  (free water 200 ml Q 4 hours) Glucose - 165->183->194->291 (SSI) Anemia - Hb 13.0->10.8  Hospital day # 9    Patient`s neurological exam remains quite poor and he  remains on full ventilatory support and is unable to be weaned due to respiratory failure.    his white count is improved with antibiotics but neurological exam remains comatose with no voluntary or involuntary movements.  Family has been notified about his poor prognosis and family needs to make a decision about goals of care.  They will need a few days to decide whether they want to proceed with tracheostomy, PEG tube and nursing home placement versus withdrawal of care.  Discussed with Dr. Lynnell Catalan This patient is critically ill and at significant risk of neurological worsening, death and care requires constant monitoring of vital signs, hemodynamics,respiratory and cardiac monitoring, extensive review of multiple databases, frequent neurological assessment, discussion with family, other specialists and medical decision making of high complexity.I have made any additions or clarifications directly to the above note.This critical care time does not reflect procedure time, or teaching time or supervisory time of PA/NP/Med Resident etc but could involve care discussion time.  I spent 35 minutes of neurocritical care time  in the care of  this patient.     Delia Heady, MD Medical Director [facility] Pager:  [phone] 01/08/2021 3:26 PM   To contact Stroke Continuity provider, please refer to [email or web address]. After hours, contact General Neurology

## 2021-01-08 NOTE — Progress Notes (Signed)
CCM Interval Progress Note   55 yo M L basal ganglia ICH with IVF who is intubated, sedated (but weaning as tolerated) and on cleviprex for BP management   Referred to bedside for tachypnea tachycardia and hypertension. He is on precedex gtt, has received PRN fent, PRN antihypertensives    On my arrival, patient SBPs 190 HR 100s RR 30s Pulling 800+Vts.   His eyes are open, Pupils 41mm. + cough/gag.  Lungs have coarse rhonchi bilaterally and some expiratory wheeze. Cuff feels well inflated. He is using accessory muscles and pulling very large volumes.  Tachycardic, regular.   Acute respiratory failure with hypoxia Respiratory distress Tachycardia Hypertension -PE unlikely. On VTE ppx and vitals not c/w PE -Has MSSA PNA -- possible plug / progression -- but has been lavaged.  -Slight expiratory wheeze. Doesn't seem to be trapping on vent. Is getting a fair amount of volume on cardene gtt -- ? Cardiac wheeze. Tele doesn't look to have overt ST changes  -Intermittently there is a sound similar to a cuff leak. Cuff is well inflated. Possible slow leak, but also possible that pt is pulling around balloon  -Possible this is also autonomic storming related to the patients underlying basal ganglia bleed. He has had increasing temp today which could be r/t PNA if not autonomic.  P -STAT CXR  -Pending CXR, adjust ETT placement. At this time doubt cuff leak, but if does not improve could certainly eval for this -Will give 1x solumedrol and order nebs -ECG, Trop and Ddimer  -Need to further sedate. additional fent, add prop gtt    Discussed with Drs. Agarwala and Sethi    Additional critical care time 43 minutes  Tessie Fass MSN, AGACNP-BC [facility] Pulmonary/Critical Care Medicine Amion for pager 01/08/2021, 6:21 PM

## 2021-01-08 NOTE — Progress Notes (Signed)
Nutrition Follow-up  DOCUMENTATION CODES:   Not applicable  INTERVENTION:   Tube feeding via Cortrak tube: D/C Osmolite 1.5   Glucerna 1.2 @ 70 ml/hr (1680 ml/day) ProSource TF 45 ml daily   Tube feeding regimen provides 2056 kcal, 111 grams of protein, and 1362 ml of H2O.  200 ml free water every 2 hours  Total free water: 3762 ml    NUTRITION DIAGNOSIS:   Inadequate oral intake related to lethargy/confusion, dysphagia as evidenced by NPO status. Ongoing.   GOAL:   Patient will meet greater than or equal to 90% of their needs Met with TF at goal.   MONITOR:   Diet advancement, Labs, Weight trends  REASON FOR ASSESSMENT:   Ventilator, Consult Enteral/tube feeding initiation and management  ASSESSMENT:   55 year old male who presented to the ED on 11/29 with AMS. PMH of HTN, T2DM, CKD stage III. 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  Pt discussed during ICU rounds and with RN.  Pt with increasing BP, WOB, temps. Also noted increasing blood sugars.    Patient is currently intubated on ventilator support Temp (24hrs), Avg:100 F (37.8 C), Min:98.6 F (37 C), Max:101.9 F (38.8 C)   11/30 cortrak placed; tip gastric 12/2 trickle TF 12/3 pt intubated due to increased WOB, +emesis, TF held 12/4 trickle TF restarted 12/5 advancement orders placed 12/9 TF adjusted  Medications reviewed and include: colace, SSI, novolog, levemir, protonix, miralax, senokot-s  NS @ 75 ml/hr  Precedex Cardene   Labs reviewed: Na 153 CBG's: 286-322  Current TF:  Osmolite 1.5 @ 60 ml/hr with ProSource TF 45 ml daily Provides 2200 kcal, 101 grams of protein  Diet Order:   Diet Order             Diet NPO time specified  Diet effective now                   EDUCATION NEEDS:   Not appropriate for education at this time  Skin:  Skin Assessment: Reviewed RN Assessment  Last BM:  12/4 small; type  7  Height:   Ht Readings from Last 1 Encounters:  01/03/21 6' (1.829 m)    Weight:   Wt Readings from Last 1 Encounters:  01/07/21 85.2 kg   BMI:  Body mass index is 25.47 kg/m.  Estimated Nutritional Needs:   Kcal:  2000-2200  Protein:  100-115 grams  Fluid:  >/= 2.0 L  Raymona Boss P., RD, LDN, CNSC See AMiON for contact information

## 2021-01-08 NOTE — Progress Notes (Signed)
NAME:  JABRIL PURSELL, MRN:  [id number], DOB:  1965-09-21, LOS: 9 ADMISSION DATE:  12/30/2020, CONSULTATION DATE:  01/01/21 REFERRING MD:  xu (stroke), CHIEF COMPLAINT:  respiratory failure   History of Present Illness:  55 yo male presented with headache, nausea and altered mental status.  CT head showed acute Lt ICH with IVH likely related to hypertensive emergency (BP 195/116).  Had persistent hypertension and concern for airway protection, and PCCM asked to assist with ICU management.  Pertinent  Medical History  HTN, DM type 2, CKD 3a, OSA  Significant Hospital Events:  11/29 Admit, start cleviprex 12/01 PCCM consulted; changed from cleviprex to cardene due to elevated triglycerides 12/03 intubated; episode of vomiting >> tube feeds held 12/04 resume trickle tube feeds 12/8 weaning vent   Studies:  CT head 11/29 >> large amount of IVH involving lateral/3rd/4th ventricles, ICH in Lt BG, chronic microvascular ischemic changes Echo 11/30 >> EF 50 to 55%, mod LVH, grade 1 DD, ascending aorta 37 mm EEG 12/01 >> continuous generalized slowing MRI brain 12/03 >> numerous small acute infarcts in b/l cerebral hemispheres, Lt caudate hemorrhage, IVH, scattered sulcal SAH, mild communicating hydrocephalus 12/5 Started on free water for hypernatremia. Cefazolin for MSSA pneumonia.  Interim History / Subjective:  On cardene, precedex Weaning vent   Objective   Blood pressure (!) 146/68, pulse 71, temperature 99.7 F (37.6 C), temperature source Axillary, resp. rate (!) 23, height 6' (1.829 m), weight 85.2 kg, SpO2 99 %.    Vent Mode: PSV;CPAP FiO2 (%):  [30 %] 30 % Set Rate:  [16 bmp] 16 bmp Vt Set:  [620 mL] 620 mL PEEP:  [5 cmH20] 5 cmH20 Pressure Support:  [10 cmH20] 10 cmH20 Plateau Pressure:  [11 cmH20-16 cmH20] 16 cmH20   Intake/Output Summary (Last 24 hours) at 01/08/2021 0938 Last data filed at 01/08/2021 0600 Gross per 24 hour  Intake 5579.82 ml  Output 2576 ml  Net  3003.82 ml   Filed Weights   12/31/20 1435 01/07/21 0500  Weight: 85.2 kg 85.2 kg    Examination:  General - critically ill middle aged M intubated, lightly sedated HEENT: NCAT ETT secure. Anicteric sclera  Cardiac - rrr s1s2 no rgm  Pulm: CTA. Symmetrical chest expansion. Even unlabored  Abdomen - soft ndnt + bowel sounds FMS Extremities - no acute deformity. No cyanosis. Pedal edema  Skin - c/d/w no rash  Neuro -Opened eyes spontaneously once, with rightward gaze. Subsequent roving eyes, downward gaze. Did not withdraw to pain  Ancillary tests personally reviewed:    CXR 12/5: ETT in good position with clear lung fields.  Hypernatremia to 152 MSSA in sputum Leukocytosis to 20.5 MRI shows multiple small strokes consistent with hypertensive disease.  MRI shows no vascular abnormalities to explain hemorrhage. Assessment & Plan:    L basal ganglia hemorrhage with intraventricular extension P -weaning cardene. On minoxidil, lisinopril, carvedilol  -SBP goal < 160  Keep systolic blood pressure less than 160.    Acute respiratory failure with hypoxia Staph aureus and aspiration PNA P -7d course ancef  -cont MV support -- prior weaning attempts not successful   Fever -infectious vs r/t bleed  P -buspirone, APAP. Can add cooling blanket if needed   CKD III Hypernatremia P -cont FWF -trend BMP, UOP  DM2 -Incr SSI  GOC - Long term prognosis is guarded given persistently poor mental status and stroke burden.  -per neuro note, it sounds like a family meeting is being organized to  discuss GOC -currently full code    Best Practice (right click and "Reselect all SmartList Selections" daily)   Diet/type: tubefeeds  DVT prophylaxis: prophylactic heparin  GI prophylaxis: PPI Lines: N/A Foley:  Condom catheter Code Status:  full code Family: Family updated at bedside 12/5.  CRITICAL CARE Performed by: Cristal Generous   Total critical care time: 38  minutes  Critical care time was exclusive of separately billable procedures and treating other patients. Critical care was necessary to treat or prevent imminent or life-threatening deterioration.  Critical care was time spent personally by me on the following activities: development of treatment plan with patient and/or surrogate as well as nursing, discussions with consultants, evaluation of patient's response to treatment, examination of patient, obtaining history from patient or surrogate, ordering and performing treatments and interventions, ordering and review of laboratory studies, ordering and review of radiographic studies, pulse oximetry and re-evaluation of patient's condition.  Eliseo Gum MSN, AGACNP-BC [place] for pager 01/08/2021, 9:38 AM

## 2021-01-08 NOTE — Progress Notes (Signed)
ETT advanced 1 cm per CCM order. Patient had return volumes after advancement.

## 2021-01-09 DIAGNOSIS — I619 Nontraumatic intracerebral hemorrhage, unspecified: Secondary | ICD-10-CM | POA: Diagnosis not present

## 2021-01-09 DIAGNOSIS — J9601 Acute respiratory failure with hypoxia: Secondary | ICD-10-CM

## 2021-01-09 DIAGNOSIS — I610 Nontraumatic intracerebral hemorrhage in hemisphere, subcortical: Secondary | ICD-10-CM | POA: Diagnosis not present

## 2021-01-09 LAB — BASIC METABOLIC PANEL
Anion gap: 9 (ref 5–15)
BUN: 40 mg/dL — ABNORMAL HIGH (ref 6–20)
CO2: 20 mmol/L — ABNORMAL LOW (ref 22–32)
Calcium: 8.3 mg/dL — ABNORMAL LOW (ref 8.9–10.3)
Chloride: 120 mmol/L — ABNORMAL HIGH (ref 98–111)
Creatinine, Ser: 1.92 mg/dL — ABNORMAL HIGH (ref 0.61–1.24)
GFR, Estimated: 40 mL/min — ABNORMAL LOW (ref >60)
Glucose, Bld: 377 mg/dL — ABNORMAL HIGH (ref 70–99)
Potassium: 4.3 mmol/L (ref 3.5–5.1)
Sodium: 149 mmol/L — ABNORMAL HIGH (ref 135–145)

## 2021-01-09 LAB — CBC WITH DIFFERENTIAL/PLATELET
Abs Immature Granulocytes: 0.3 10*3/uL — ABNORMAL HIGH (ref 0.00–0.07)
Basophils Absolute: 0 10*3/uL (ref 0.0–0.1)
Basophils Relative: 0 %
Eosinophils Absolute: 0 10*3/uL (ref 0.0–0.5)
Eosinophils Relative: 0 %
HCT: 33.4 % — ABNORMAL LOW (ref 39.0–52.0)
Hemoglobin: 10.6 g/dL — ABNORMAL LOW (ref 13.0–17.0)
Immature Granulocytes: 2 %
Lymphocytes Relative: 4 %
Lymphs Abs: 0.7 10*3/uL (ref 0.7–4.0)
MCH: 24 pg — ABNORMAL LOW (ref 26.0–34.0)
MCHC: 31.7 g/dL (ref 30.0–36.0)
MCV: 75.6 fL — ABNORMAL LOW (ref 80.0–100.0)
Monocytes Absolute: 0.3 10*3/uL (ref 0.1–1.0)
Monocytes Relative: 2 %
Neutro Abs: 14.6 10*3/uL — ABNORMAL HIGH (ref 1.7–7.7)
Neutrophils Relative %: 92 %
Platelets: 260 10*3/uL (ref 150–400)
RBC: 4.42 MIL/uL (ref 4.22–5.81)
RDW: 15.3 % (ref 11.5–15.5)
WBC: 15.9 10*3/uL — ABNORMAL HIGH (ref 4.0–10.5)
nRBC: 0 % (ref 0.0–0.2)

## 2021-01-09 LAB — GLUCOSE, CAPILLARY
Glucose-Capillary: 252 mg/dL — ABNORMAL HIGH (ref 70–99)
Glucose-Capillary: 286 mg/dL — ABNORMAL HIGH (ref 70–99)
Glucose-Capillary: 305 mg/dL — ABNORMAL HIGH (ref 70–99)
Glucose-Capillary: 309 mg/dL — ABNORMAL HIGH (ref 70–99)
Glucose-Capillary: 322 mg/dL — ABNORMAL HIGH (ref 70–99)
Glucose-Capillary: 325 mg/dL — ABNORMAL HIGH (ref 70–99)

## 2021-01-09 LAB — TRIGLYCERIDES: Triglycerides: 157 mg/dL — ABNORMAL HIGH (ref <150)

## 2021-01-09 MED ORDER — POLYETHYLENE GLYCOL 3350 17 G PO PACK: 17.0000 g | PACK | Freq: Every day | ORAL | Status: DC | PRN

## 2021-01-09 MED ORDER — ALBUTEROL SULFATE (2.5 MG/3ML) 0.083% IN NEBU: 2.5000 mg | INHALATION_SOLUTION | Freq: Four times a day (QID) | RESPIRATORY_TRACT | Status: DC | PRN

## 2021-01-09 MED ORDER — FUROSEMIDE 10 MG/ML IJ SOLN
40.0000 mg | Freq: Two times a day (BID) | INTRAMUSCULAR | Status: DC
  Administered 2021-01-09 – 2021-01-11 (×5): 40 mg via INTRAVENOUS
  Filled 2021-01-09 (×5): qty 4

## 2021-01-09 MED ORDER — GLUCERNA 1.2 CAL PO LIQD
1000.0000 mL | ORAL | Status: DC
Start: 2021-01-09 — End: 2021-01-16
  Administered 2021-01-09 – 2021-01-15 (×9): 1000 mL
  Filled 2021-01-09 (×13): qty 1000

## 2021-01-09 MED ORDER — INSULIN DETEMIR 100 UNIT/ML ~~LOC~~ SOLN
15.0000 [IU] | Freq: Two times a day (BID) | SUBCUTANEOUS | Status: DC
Start: 2021-01-09 — End: 2021-01-12
  Administered 2021-01-09 – 2021-01-12 (×7): 15 [IU] via SUBCUTANEOUS
  Filled 2021-01-09 (×8): qty 0.15

## 2021-01-09 NOTE — Progress Notes (Signed)
Inpatient Diabetes Program Recommendations  AACE/ADA: New Consensus Statement on Inpatient Glycemic Control (2015)  Target Ranges:  Prepandial:   less than 140 mg/dL      Peak postprandial:   less than 180 mg/dL (1-2 hours)      Critically ill patients:  140 - 180 mg/dL   Lab Results  Component Value Date   GLUCAP 325 (H) 01/09/2021   HGBA1C 6.3 (H) 12/31/2020    Review of Glycemic Control  Latest Reference Range & Units 01/09/21 07:34 01/09/21 11:16  Glucose-Capillary 70 - 99 mg/dL 712 (H) 527 (H)  (H): Data is abnormally high   Inpatient Diabetes Program Recommendations:    Noted Levemir increased today.  Might also consider Novolog 0-20 units Q4H until steroids have cleared.  Will continue to follow while inpatient.  Thank you, Dulce Sellar, RN, BSN Diabetes Coordinator Inpatient Diabetes Program [phone] (team pager from 8a-5p)

## 2021-01-09 NOTE — Progress Notes (Signed)
NAME:  Stephen Delgado, MRN:  [id number], DOB:  03-10-1965, LOS: 10 ADMISSION DATE:  12/30/2020, CONSULTATION DATE:  01/01/21 REFERRING MD:  xu (stroke), CHIEF COMPLAINT:  respiratory failure   History of Present Illness:  55 yo male presented with headache, nausea and altered mental status.  CT head showed acute Lt ICH with IVH likely related to hypertensive emergency (BP 195/116).  Had persistent hypertension and concern for airway protection, and PCCM asked to assist with ICU management.  Pertinent  Medical History  HTN, DM type 2, CKD 3a, OSA  Significant Hospital Events:  11/29 Admit, start cleviprex 12/01 PCCM consulted; changed from cleviprex to cardene due to elevated triglycerides 12/03 intubated; episode of vomiting >> tube feeds held 12/04 resume trickle tube feeds 12/06 remains minimally responsive. Tolerating SBT.   Studies:  CT head 11/29 >> large amount of IVH involving lateral/3rd/4th ventricles, ICH in Lt BG, chronic microvascular ischemic changes Echo 11/30 >> EF 50 to 55%, mod LVH, grade 1 DD, ascending aorta 37 mm EEG 12/01 >> continuous generalized slowing MRI brain 12/03 >> numerous small acute infarcts in b/l cerebral hemispheres, Lt caudate hemorrhage, IVH, scattered sulcal SAH, mild communicating hydrocephalus 12/5 Started on free water for hypernatremia. Cefazolin for MSSA pneumonia.  Interim History / Subjective:  Still low grade febrile. Still on Cardene for hypertension.   Objective   Blood pressure (!) 147/74, pulse (!) 57, temperature 98.6 F (37 C), temperature source Oral, resp. rate 19, height 6' (1.829 m), weight 85.2 kg, SpO2 99 %.    Vent Mode: PRVC FiO2 (%):  [30 %] 30 % Set Rate:  [16 bmp] 16 bmp Vt Set:  [620 mL] 620 mL PEEP:  [5 cmH20] 5 cmH20 Plateau Pressure:  [14 cmH20-22 cmH20] 19 cmH20   Intake/Output Summary (Last 24 hours) at 01/09/2021 1030 Last data filed at 01/09/2021 0900 Gross per 24 hour  Intake 5958.35 ml  Output 2600 ml   Net 3358.35 ml   Filed Weights   12/31/20 1435 01/07/21 0500  Weight: 85.2 kg 85.2 kg    Examination:  Gen:      Intubated, acutely ill appearing HEENT:  ETT to vent Lungs:    sounds of mechanical ventilation auscultated, diminished bilaterally CV:         RRR, no mrg Abd:      + bowel sounds; soft, non-tender; no palpable masses, no distension Ext:    Diffuse upper and lower extremity edema Skin:      Warm and dry; no rashes Neuro:   not responsive   Ancillary tests personally reviewed:    CT head 12/8 shows interval decrease  Assessment & Plan:   Critically ill due to left basal ganglia (caudate) hemorrhage with significant intraventricular extension requiring mechanical ventilation for airway protection titration of IV antihypertensive agents. - persistent hypertensive urgency on nicardipine - continue minoxidil, coreg, lisinopril - nicardipine coming down, goal SBP<160 - lasix will help BP control - very poor prognosis with hemorrhage, neurology to talk with family today   Acute hypoxic respiratory failure due to Staphylococcus aureus aspiration pneumonia - diuresis as below - SBT as tolerated, continue lung protective ventilation - despite fixing hypoxemia, mental status may still be barrier to extubation - will likely need trach/peg if all aggressive measures are pursued.   Type 2 diabetes - hyperglycemia - worsened in the setting of solumedrol - stop solumedrol - increasing long acting insulin  Stage IIIa CKD Hypernatremia - likely due to normal saline infusion -  patient is over 20 L positive - stop normal saline infusion - initiate lasix for diuresis and  BP management - continue free h20 flushes - repeat BMP  FUO - suspect central etiology - continue buspar for now  Best Practice (right click and "Reselect all SmartList Selections" daily)   Diet/type: tubefeeds  DVT prophylaxis: prophylactic heparin  GI prophylaxis: PPI Lines: N/A Foley:  Condom  catheter Code Status:  full code Family: Family updated at bedside 12/9, son, wife.   CRITICAL CARE The patient is critically ill due to respiratory failure, intraventricular hemorrhage.  Critical care was necessary to treat or prevent imminent or life-threatening deterioration.  Critical care was time spent personally by me on the following activities: development of treatment plan with patient and/or surrogate as well as nursing, discussions with consultants, evaluation of patient's response to treatment, examination of patient, obtaining history from patient or surrogate, ordering and performing treatments and interventions, ordering and review of laboratory studies, ordering and review of radiographic studies, pulse oximetry, re-evaluation of patient's condition and participation in multidisciplinary rounds.   Critical Care Time devoted to patient care services described in this note is 47 minutes. This time reflects time of care of this signee Charlott Holler . This critical care time does not reflect separately billable procedures or procedure time, teaching time or supervisory time of PA/NP/Med student/Med Resident etc but could involve care discussion time.       Charlott Holler [facility] 01/09/2021 10:30 AM  Pager: see AMION  If no response to pager , please call critical care on call (see AMION) until 7pm After 7:00 pm call Elink

## 2021-01-09 NOTE — Progress Notes (Addendum)
STROKE TEAM PROGRESS NOTE   INTERVAL HISTORY Patient is laying in the bed, unresponsive on ventilator. Son and wife are at the bedside. RN is present on rounds. Last pm patient became agitated, with tachycardia, tachypnea and accessory muscle use requiring increased sedation and received solumedrol. Exam is similar to yesterday, unresponsive, no eye opening to voice or painful stimuli, no response to painful stimuli, does not follow commands. Wife arrived to bedside during exam. Dr. Leonie Man spoke with the wife and son at length on rounds this am in regards to poor neurological exam and poor prognosis. It has been a week of being on the ventilator and receiving antibiotics with no improvement in neurological exam. Has not been able to do a SBT. Family informed that next steps would be trach and peg placement and SNF placement. Family has agreed to speak with Palliative care to help with medical decision making and goals of care.   Vitals:   01/09/21 0900 01/09/21 0915 01/09/21 0930 01/09/21 0945  BP: (!) 144/72 140/75 (!) 145/75 (!) 147/74  Pulse: (!) 56 (!) 57 (!) 58 (!) 57  Resp: 17 19 19 19   Temp:      TempSrc:      SpO2: 99% 99% 100% 99%  Weight:      Height:       CBC:  Recent Labs  Lab 01/08/21 0146 01/09/21 0707  WBC 12.0* 15.9*  NEUTROABS 9.6* 14.6*  HGB 11.6* 10.6*  HCT 37.4* 33.4*  MCV 77.1* 75.6*  PLT 197 [id number]    Basic Metabolic Panel:  Recent Labs  Lab 01/03/21 0458 01/03/21 0857 01/08/21 0146 01/09/21 0707  NA 144   < > 153* 149*  K 3.6   < > 4.8 4.3  CL 114*   < > 126* 120*  CO2 19*   < > 15* 20*  GLUCOSE 183*   < > 281* 377*  BUN 46*   < > 34* 40*  CREATININE 2.06*   < > 2.06* 1.92*  CALCIUM 8.5*   < > 8.5* 8.3*  MG 2.4  --   --   --    < > = values in this interval not displayed.    Lipid Panel:  Recent Labs  Lab 01/09/21 0707  TRIG 157*     IMAGING past 24 hours CT HEAD WO CONTRAST (5MM)  Result Date: 01/08/2021 CLINICAL DATA:  Follow-up  examination for acute stroke, intracranial hemorrhage. EXAM: CT HEAD WITHOUT CONTRAST TECHNIQUE: Contiguous axial images were obtained from the base of the skull through the vertex without intravenous contrast. COMPARISON:  Prior CT from 01/06/2021. FINDINGS: Brain: Previously identified intraparenchymal hemorrhage involving the left caudate is decreased in size now measuring up to 12 mm. Associated intraventricular extension with blood in the lateral ventricles is also decreased. Associated ventricular size is not significantly changed or worsened without progressive hydrocephalus or trapping. No new intracranial hemorrhage or large vessel territory infarct. Underlying atrophy with chronic microvascular ischemic disease noted. No mass lesion or extra-axial fluid collection. No midline shift. Vascular: No hyperdense vessel. Skull: Scalp soft tissues and calvarium demonstrate no new finding. Sinuses/Orbits: Globes orbital soft tissues demonstrate no acute finding. Nasogastric and enteric tubes in place. Scattered mucosal thickening noted within the sphenoid ethmoidal and maxillary sinuses. Small bilateral mastoid effusions. Other: None. IMPRESSION: 1. Interval decrease in size of intraparenchymal hemorrhage involving the left caudate, with interval decrease in associated intraventricular hemorrhage as well. Ventricular size is not significantly changed or worsened with no evidence  of progressive hydrocephalus or trapping. 2. No other new acute intracranial abnormality. Electronically Signed   By: Jeannine Boga M.D.   On: 01/08/2021 20:59   DG CHEST PORT 1 VIEW  Result Date: 01/08/2021 CLINICAL DATA:  AMS, SOB EXAM: PORTABLE CHEST - 1 VIEW COMPARISON:  01/05/2021 FINDINGS: Endotracheal tube has been repositioned, tip approximately 5 cm above carina. Feeding tube in place, tip not seen. Relatively low lung volumes. Lungs are clear. Heart size upper limits normal. No effusion. Visualized bones unremarkable.  IMPRESSION: Support hardware in good position.  No acute findings. Electronically Signed   By: Lucrezia Europe M.D.   On: 01/08/2021 18:16    PHYSICAL EXAM General:  Patient is a well-developed, well-nourished middle-age African-American male who is intubated on Precedex gtt. Prop was on hold during exam  Ext: no C/C/E. HEENT: intubated, NG tube. Resp: intubated, tachypnic  CV: RRR.  GI: Abdomen soft with active bowel sounds Skin: no rashes.   Neurological: Eyes are closed. patient does not respond to name, noxious stimuli, or follow commands.   PERRL. Incomplete oculocephalic reflex.  Mild Ocular bobbing noted. Cough and gag are weakly present.  Has some spontaneous respiratory effort above ventilator settings.  Easily becomes dyssynchronous with the ventilator. RUE and RLE nonresponsive to noxious stimuli no response to sternal rub or nailbed pressure LUE and LLE nonresponsive to noxious stimuli. Coordination- Deferred Sensation-Deferred Gait- Deferred  ASSESSMENT/PLAN [name] is a 55 y.o. male with history of HTN (questionable compliance with medications), DM and CKD 3 presenting with headache and altered mental status. 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  He was hypertensive on arrival to 238/160 and was given Cleviprex for BP control.  He was also agitated and required restraints.  Repeat head CT 11/30 shows unchanged IPH with IVH  Head CTs done on 12/1 and 12/3 also demonstrates unchanged IPH with IVH.  MRI/MRA completed on 12/4.  ICH score 2  ICH with IVH: and bilateral lacunar infarcts:  left basal ganglia ICH with IVH likely secondary to hypertensive emergency with bicerebral multiple subcortical lacunar infarcts as well CT head Large basal ganglia IPH with IVH involving the lateral, third and fourth ventricles with no significant mass effect or midline shift.  Repeat CT 11/30 0116 Unchanged IPH with IVH Repeat CT head 11/30 1629   Stable IPH with IVH, stable mild hydrocephalus Repeat CT Head 12/6 - Unchanged intraventricular hemorrhage and left caudate head intraparenchymal component. No new site of hemorrhage. 2D Echo EF [id number], grade 1 diastolic dysfunction, no atrial level shunt MRI-10/3-Numerous small acute infarcts in bilateral cerebral hemispheres. Minimal involvement of the right superior cerebellum.  Left caudate hemorrhage, intraventricular hemorrhage, and scattered sulcal subarachnoid hemorrhage as seen on recent CT imaging. Associated mild communicating hydrocephalus. Numerous chronic microhemorrhages probably secondary to hypertension. Chronic microvascular ischemic changes MRA head and neck 10/4- Mildly tortuous cervical carotid and vertebral arteries without stenosis. Intracranial hemorrhage and ventricles appears stable from the MRI yesterday. CUS unremarkable LDL 111.4 HgbA1c 6.3 VTE prophylaxis - SCDs aspirin 81 mg daily prior to admission, now on No antithrombotic secondary to [place] Therapy recommendations:  SNF Disposition:  pending  Respiratory distress Intubated this am. CCM on board Tachypnea- PRN fentanyl and Versed ETT exchanged 12/4 due to a cuff leak Precedex for sedation currently running. Prop is on hold  Hypertensive emergency: improving. Home meds:  Amlodipine 10 mg daily, metoprolol succinate 25 mg daily,  Unstable  Cardene, still requiring Cardene to maintain BP at goal. Hopefully will be able to get off today  SBP goal <160 Currently On norvasc 10mg , coreg 25mg , Lisinopril 40mg , Clonidine 0.3 mg q8h and minoxidil 10mg   Long-term BP goal normotensive  Hyperlipidemia Home meds:  none LDL 111.4, goal < 70 Hold off statin for now due to acute ICH Consider statin at discharge         AKI on CKD Cre 1.83-2.23-2.46 -> 2.10->2.04->1.97->1.92 On IVF and TF Close monitoring I/O CCM on board        Other Stroke Risk Factors Questionable compliance with medications at home         Other Active Problems Code status - full code Hypokalemia 3.1 -> supplement->3.4 -> supplemented further - (daily labs) Hypernatremia- 153->152->149  - free water 200 ml Q 4 hours Daily labs Ileus seen on KUB- fleet enema give 12/1; bowel regimen in place Aspiration pneumonia -> Ancef started 12/5 per CCM - temp 99.4 - WBCs 20.5->12.2->15.9 Anemia - Hb 13.0->10.8->10.6  Hospital day # 10  Patient seen and examined by NP/APP with MD. MD to update note as needed.   , DNP, ACNPC-AG  STROKE MD NOTE : I have personally obtained history,examined this patient, reviewed notes, independently viewed imaging studies, participated in medical decision making and plan of care.ROS completed by me personally and pertinent positives fully documented  I have made any additions or clarifications directly to the above note. Agree with note above.  Patient remains critically ill with respiratory failure requiring ventilatory support and unable to be weaned with poor neurological exam.  He has not been on antibiotics for a week for his aspiration pneumonia and there appears to be hardly any neurological improvement and his prognosis is quite poor.  He is unlikely to survive without prolonged ventilatory support, tracheostomy, PEG tube and 24-hour nursing care.  I had a long discussion the patient's son and wife at the bedside and encouraged him to think about goals of care and make decisions regarding withdrawal of ventilatory support, comfort care versus trach and PEG.  They are agreeable to talk to palliative care team and make decision soon.  Discussed with Dr. 07-08-1990 critical care medicine This patient is critically ill and at significant risk of neurological worsening, death and care requires constant monitoring of vital signs, hemodynamics,respiratory and cardiac monitoring, extensive review of multiple databases, frequent neurological assessment, discussion with family, other specialists and medical  decision making of high complexity.I have made any additions or clarifications directly to the above note.This critical care time does not reflect procedure time, or teaching time or supervisory time of PA/NP/Med Resident etc but could involve care discussion time.  I spent 40 minutes of neurocritical care time  in the care of  this patient.      14/1, MD Medical Director [facility] Pager: [phone] 01/09/2021 1:06 PM   To contact Stroke Continuity provider, please refer to Delia Heady. After hours, contact General Neurology

## 2021-01-10 DIAGNOSIS — I6389 Other cerebral infarction: Secondary | ICD-10-CM | POA: Diagnosis not present

## 2021-01-10 DIAGNOSIS — Z515 Encounter for palliative care: Secondary | ICD-10-CM | POA: Diagnosis not present

## 2021-01-10 DIAGNOSIS — J9601 Acute respiratory failure with hypoxia: Secondary | ICD-10-CM | POA: Diagnosis not present

## 2021-01-10 DIAGNOSIS — I610 Nontraumatic intracerebral hemorrhage in hemisphere, subcortical: Secondary | ICD-10-CM | POA: Diagnosis not present

## 2021-01-10 DIAGNOSIS — Z7189 Other specified counseling: Secondary | ICD-10-CM

## 2021-01-10 DIAGNOSIS — Z978 Presence of other specified devices: Secondary | ICD-10-CM | POA: Diagnosis not present

## 2021-01-10 DIAGNOSIS — I161 Hypertensive emergency: Secondary | ICD-10-CM | POA: Diagnosis not present

## 2021-01-10 LAB — GLUCOSE, CAPILLARY
Glucose-Capillary: 209 mg/dL — ABNORMAL HIGH (ref 70–99)
Glucose-Capillary: 167 mg/dL — ABNORMAL HIGH (ref 70–99)
Glucose-Capillary: 163 mg/dL — ABNORMAL HIGH (ref 70–99)
Glucose-Capillary: 177 mg/dL — ABNORMAL HIGH (ref 70–99)
Glucose-Capillary: 173 mg/dL — ABNORMAL HIGH (ref 70–99)

## 2021-01-10 LAB — HEPATIC FUNCTION PANEL
ALT: 123 U/L — ABNORMAL HIGH (ref 0–44)
AST: 176 U/L — ABNORMAL HIGH (ref 15–41)
Albumin: 1.6 g/dL — ABNORMAL LOW (ref 3.5–5.0)
Alkaline Phosphatase: 73 U/L (ref 38–126)
Bilirubin, Direct: 0.1 mg/dL (ref 0.0–0.2)
Indirect Bilirubin: 0.1 mg/dL — ABNORMAL LOW (ref 0.3–0.9)
Total Bilirubin: 0.2 mg/dL — ABNORMAL LOW (ref 0.3–1.2)
Total Protein: 5.3 g/dL — ABNORMAL LOW (ref 6.5–8.1)

## 2021-01-10 LAB — CBC WITH DIFFERENTIAL/PLATELET
Abs Immature Granulocytes: 0.19 10*3/uL — ABNORMAL HIGH (ref 0.00–0.07)
Basophils Absolute: 0 10*3/uL (ref 0.0–0.1)
Basophils Relative: 0 %
Eosinophils Absolute: 0 10*3/uL (ref 0.0–0.5)
Eosinophils Relative: 0 %
HCT: 35 % — ABNORMAL LOW (ref 39.0–52.0)
Hemoglobin: 11.2 g/dL — ABNORMAL LOW (ref 13.0–17.0)
Immature Granulocytes: 1 %
Lymphocytes Relative: 6 %
Lymphs Abs: 1 10*3/uL (ref 0.7–4.0)
MCH: 23.9 pg — ABNORMAL LOW (ref 26.0–34.0)
MCHC: 32 g/dL (ref 30.0–36.0)
MCV: 74.8 fL — ABNORMAL LOW (ref 80.0–100.0)
Monocytes Absolute: 0.7 10*3/uL (ref 0.1–1.0)
Monocytes Relative: 4 %
Neutro Abs: 13.8 10*3/uL — ABNORMAL HIGH (ref 1.7–7.7)
Neutrophils Relative %: 89 %
Platelets: 216 10*3/uL (ref 150–400)
RBC: 4.68 MIL/uL (ref 4.22–5.81)
RDW: 14.9 % (ref 11.5–15.5)
WBC: 15.6 10*3/uL — ABNORMAL HIGH (ref 4.0–10.5)
nRBC: 0 % (ref 0.0–0.2)

## 2021-01-10 LAB — BASIC METABOLIC PANEL
Anion gap: 9 (ref 5–15)
BUN: 49 mg/dL — ABNORMAL HIGH (ref 6–20)
CO2: 20 mmol/L — ABNORMAL LOW (ref 22–32)
Calcium: 8.4 mg/dL — ABNORMAL LOW (ref 8.9–10.3)
Chloride: 119 mmol/L — ABNORMAL HIGH (ref 98–111)
Creatinine, Ser: 2.2 mg/dL — ABNORMAL HIGH (ref 0.61–1.24)
GFR, Estimated: 34 mL/min — ABNORMAL LOW (ref >60)
Glucose, Bld: 185 mg/dL — ABNORMAL HIGH (ref 70–99)
Potassium: 3.5 mmol/L (ref 3.5–5.1)
Sodium: 148 mmol/L — ABNORMAL HIGH (ref 135–145)

## 2021-01-10 NOTE — Consult Note (Signed)
Palliative Medicine Inpatient Consult Note  Consulting Provider: Mathews Argyle, NP  Reason for consult:   Palliative Care Consult Services Palliative Medicine Consult  Reason for Consult? GOC/EOL decision making with family   HPI:  Per intake H&P --> 55 yo male presented with headache, nausea and altered mental status.  CT head showed acute Lt ICH with IVH likely related to hypertensive emergency (BP 195/116).  Had persistent hypertension and concern for airway protection, intubated.   Neurology shared with patients son and spouse that his neurological exam remains to be poor and his likelihood of recovery is dismal. Palliative care was asked to follow up with additional goals of care conversations.  Clinical Assessment/Goals of Care:  *Please note that this is a verbal dictation therefore any spelling or grammatical errors are due to the "Dragon [employer] One" system interpretation.  I have reviewed medical records including EPIC notes, labs and imaging, received report from bedside RN, assessed the patient who was intubated and sedated.    I called patients spouse, Stephen Delgado to further discuss diagnosis prognosis, GOC, EOL wishes, disposition and options.  I introduced Palliative Medicine as specialized medical care for people living with serious illness. It focuses on providing relief from the symptoms and stress of a serious illness. The goal is to improve quality of life for both the patient and the family.  Stephen Delgado is from [place], [place] originally. He and his spouse have lived here for the past twenty-give years. They share two sons. 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 He is identified as a Psychologist, prison and probation services.   Prior to admission, Stephen Delgado was fully independent of all bADL's and iADL's.   Discussed with patients spouse how shocking his present state is. She expresses a strong faith and that many  people are praying for Stephen Delgado.  We reviewed Stephen Delgado's reason for hospitalization being his left basal ganglia ICH and IVH. Reviewed that his stroke was in the setting of uncontrolled hypertension. Discussed his prolonged hospitalization of ten days with complications of aspiration pneumonia. Reviewed that he is making little to no neurological recovery.    A detailed discussion was had today regarding advanced directives, Stephen Delgado had never completed these. His spouse, Stephen Delgado is his primary Management consultant.    Discussed meeting with all of Stephen Delgado's family members to further decide what they believe his wishes would be. Stephen Delgado expresses that it is difficult to meet with all those who are important to Stephen Delgado due to their complicated work schedules. She realizes the importance of a joint meeting and anticipated that we can pursue this tomorrow.  Reviewed the importance of today considering what is important to the patient. Discussed if a PEG and Trach would aligned with his personal wishes. Reviewed consideration of code status given Stephen Delgado's clinical state and unlikely recovery.  Decision Maker: Stephen Delgado (spouse): [phone]  Summary and Recommendations: Full Code/ Full Scope of care for the time being  Plan for family meeting tomorrow - patients spouse will confirm time in the AM, it's difficult given that the participants work third shift  Patients spouse requests Chaplain for prayer with the patient  Ongoing PMT support given patients critical state  Code Status/Advance Care Planning: FULL CODE   Palliative Prophylaxis:  Oral Care, Turn Q2H, Pain, Constipation  Additional Recommendations (Limitations, Scope, Preferences): Continue current scope of care   Psycho-social/Spiritual:  Desire for further Chaplaincy support: Christian Additional Recommendations: Education on the  severity of uncontrolled HTN and hemorrhagic stroke(s)   Prognosis: Very poor overall.  Discharge Planning:  Unclear  Vitals:   01/10/21 0500 01/10/21 0600  BP: 130/67 119/63  Pulse: 72 71  Resp: 15 18  Temp:    SpO2: 99% 98%    Intake/Output Summary (Last 24 hours) at 01/10/2021 [id number] Last data filed at 01/10/2021 0600 Gross per 24 hour  Intake 2047.2 ml  Output 3251 ml  Net -1203.8 ml   Last Weight  Most recent update: 01/07/2021  5:27 AM    Weight  85.2 kg (187 lb 13.3 oz)            Gen:  Exceptionally ill Middle aged AA M HEENT: ETT, (+) coretrack, dry mucous membranes CV: Regular rate and rhythm  PULM: On mechanical ventilator ABD: soft/nontender  EXT: Generalized edema  Neuro: Sedated  PPS: 10%   This conversation/these recommendations were discussed with patient primary care team, Dr. Leonie Man  Time In: 0850 Time Out: 1000 Total Time: 70 Greater than 50%  of this time was spent counseling and coordinating care related to the above assessment and plan.  [place] Team Team Cell Phone: [phone] Please utilize secure chat with additional questions, if there is no response within 30 minutes please call the above phone number  Palliative Medicine Team providers are available by phone from 7am to 7pm daily and can be reached through the team cell phone.  Should this patient require assistance outside of these hours, please call the patient's attending physician.

## 2021-01-10 NOTE — Progress Notes (Addendum)
STROKE TEAM PROGRESS NOTE   INTERVAL HISTORY Patient is seen in his room with no family at the bedside today.  He has been hemodynamically stable and has had no acute events overnight.  His neurological exam remains unchanged.  Palliative care had a conversation with patient's wife, and family meeting for [place] discussion is scheduled for tomorrow.  Vitals:   01/10/21 0900 01/10/21 1000 01/10/21 1128 01/10/21 1200  BP: (!) 149/75 131/66 129/69   Pulse: 81 80 95   Resp: 20 19 (!) 25   Temp:    (!) 101.1 F (38.4 C)  TempSrc:    Axillary  SpO2: 98% 98% 97%   Weight:      Height:       CBC:  Recent Labs  Lab 01/09/21 0707 01/10/21 0351  WBC 15.9* 15.6*  NEUTROABS 14.6* 13.8*  HGB 10.6* 11.2*  HCT 33.4* 35.0*  MCV 75.6* 74.8*  PLT 260 [id number]    Basic Metabolic Panel:  Recent Labs  Lab 01/09/21 0707 01/10/21 0807  NA 149* 148*  K 4.3 3.5  CL 120* 119*  CO2 20* 20*  GLUCOSE 377* 185*  BUN 40* 49*  CREATININE 1.92* 2.20*  CALCIUM 8.3* 8.4*    Lipid Panel:  Recent Labs  Lab 01/09/21 0707  TRIG 157*     IMAGING past 24 hours No results found.  PHYSICAL EXAM General:  Patient is a well-developed, well-nourished middle-aged African-American male who is intubated on Precedex gtt.  Respiratory: Patient is intubated, on ventilator and tachypneic  Neurological:Patient will not respond to voice or open eyes to sternal rub.  Corneal reflexes present bilaterally, cough reflex present. No doll's eyes reflex.  Patient does not move extremities in response to noxious stimuli, negative Babinski reflex.   ASSESSMENT/PLAN Mr. LADISLAO DORITY is a 55 y.o. male with history of HTN (questionable compliance with medications), DM and CKD 3 presenting with headache and altered mental status. 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  He was hypertensive on arrival to 238/160 and was given Cleviprex for BP control.  He was also agitated and required  restraints.  Repeat head CT 11/30 shows unchanged IPH with IVH  Head CTs done on 12/1 and 12/3 also demonstrates unchanged IPH with IVH.  MRI/MRA completed on 12/4.Patient's neurological exam continues to be poor, and [place] conversation with palliative care and patient's family is scheduled for tomorrow.  ICH score 2  ICH with IVH - left basal ganglia ICH with IVH likely secondary to hypertensive emergency  bilateral lacunar infarcts - likely small vessel disease vs. Strict BP control in the setting of ICH CT head Large basal ganglia IPH with IVH involving the lateral, third and fourth ventricles with no significant mass effect or midline shift.  Repeat CT 11/30 0116 Unchanged IPH with IVH Repeat CT head 11/30 1629  Stable IPH with IVH, stable mild hydrocephalus Repeat CT Head 12/6 - Unchanged IVH and left caudate head intraparenchymal component. No new site of hemorrhage. 2D Echo EF 99991111, grade 1 diastolic dysfunction, no atrial level shunt MRI-10/3-Numerous small acute infarcts in bilateral cerebral hemispheres. Minimal involvement of the right superior cerebellum.  Left caudate hemorrhage, intraventricular hemorrhage, and scattered sulcal subarachnoid hemorrhage as seen on recent CT imaging. Associated mild communicating hydrocephalus. Numerous chronic microhemorrhages probably secondary to hypertension. Chronic microvascular ischemic changes MRA head and neck 10/4- Mildly tortuous cervical carotid and vertebral arteries without stenosis. Intracranial hemorrhage and ventricles appears  stable from the MRI yesterday. CUS unremarkable LDL 111.4 HgbA1c 6.3 VTE prophylaxis - SCDs aspirin 81 mg daily prior to admission, now on No antithrombotic secondary to IPH Therapy recommendations:  SNF Disposition:  pending  Respiratory distress Intubated and ventilated CCM on board Tachypnea- PRN fentanyl and Versed ETT exchanged 12/4 due to a cuff leak Precedex for sedation currently running. Prop is on  hold  Hypertensive emergency: improving. Home meds:  Amlodipine 10 mg daily, metoprolol succinate 25 mg daily,  Unstable No longer requiring Cardene to maintain BP at goal.  SBP goal <160 Currently On norvasc 10mg , coreg 25mg , Lisinopril 40mg , Clonidine 0.3 mg q8h and minoxidil 10mg  daily Long-term BP goal normotensive  Hyperlipidemia Home meds:  none LDL 111.4, goal < 70 Hold off statin for now due to acute ICH Consider statin at discharge         AKI on CKD Cre 1.83-2.23-2.46 -> 2.10->2.04->1.97->1.92-> 2.20->1.92 On IVF and TF Close monitoring I/O CCM on board        Other Stroke Risk Factors Questionable compliance with medications at home        Other Active Problems Code status - full code Hypokalemia 3.1 -> 3.4 -> 3.5 Hypernatremia- 153->152->149->148  - free water 200 ml Q 2 hours Ileus seen on KUB- fleet enema given 12/1; bowel regimen in place Aspiration pneumonia -> Ancef started 12/5 per CCM - temp 101.1 - WBCs 20.5->12.2->15.9-> 15.6 Anemia - Hb 13.0->10.8->10.6-> 11.2  Hospital day # 11  Cortney E , MSN, AGACNP-BC [facility] See Amion for schedule and pager information 01/10/2021 12:42 PM  ATTENDING NOTE: I reviewed above note and agree with the assessment and plan. Pt was seen and examined.   No family at bedside.  Patient still intubated on ventilation.  Unresponsive.  On exam, patient eyes closed, with forced eye opening, eyes midline, PERRL, no doll's eyes, not blinking to visual threat.  Corneal bilaterally present, gag and cough present.  However not moving all extremities even with pain stimulation.  Serial CT showed unchanged IVH and ICH, no hydrocephalus.  Continue current management.  BP stable on multiple BP meds, hypernatremia on free water 200cc Q2h and tube feeding.  Still has intermittent fever, on Ancef.  Continue Precedex for sedation per CCM.  Palliative care consulted and planning for family meeting  tomorrow.  For detailed assessment and plan, please refer to above as I have made changes wherever appropriate.   14/1, MD PhD Stroke Neurology 01/10/2021 5:19 PM  This patient is critically ill due to ICH, IVH, multifocal infarcts, hypertensive emergency, fever with possible sepsis and at significant risk of neurological worsening, death form recurrent stroke and ICH, cerebral edema, brain herniation, seizure, septic shock. This patient's care requires constant monitoring of vital signs, hemodynamics, respiratory and cardiac monitoring, review of multiple databases, neurological assessment, discussion with family, other specialists and medical decision making of high complexity. I spent 35 minutes of neurocritical care time in the care of this patient.     To contact Stroke Continuity provider, please refer to [place] [place]. After hours, contact General Neurology

## 2021-01-10 NOTE — Progress Notes (Signed)
NAME:  Stephen CoyerJerry N Delgado, MRN:  [id number], DOB:  1965/07/10, LOS: 11 ADMISSION DATE:  12/30/2020, CONSULTATION DATE:  01/01/21 REFERRING MD:  xu (stroke), CHIEF COMPLAINT:  respiratory failure   History of Present Illness:  55 yo male presented with headache, nausea and altered mental status.  CT head showed acute Lt ICH with IVH likely related to hypertensive emergency (BP 195/116).  Had persistent hypertension and concern for airway protection, and PCCM asked to assist with ICU management.  Pertinent  Medical History  HTN, DM type 2, CKD 3a, OSA  Significant Hospital Events:  11/29 Admit, start cleviprex 12/01 PCCM consulted; changed from cleviprex to cardene due to elevated triglycerides 12/03 intubated; episode of vomiting >> tube feeds held 12/04 resume trickle tube feeds 12/06 remains minimally responsive. Tolerating SBT.   Studies:  CT head 11/29 >> large amount of IVH involving lateral/3rd/4th ventricles, ICH in Lt BG, chronic microvascular ischemic changes Echo 11/30 >> EF 50 to 55%, mod LVH, grade 1 DD, ascending aorta 37 mm EEG 12/01 >> continuous generalized slowing MRI brain 12/03 >> numerous small acute infarcts in b/l cerebral hemispheres, Lt caudate hemorrhage, IVH, scattered sulcal SAH, mild communicating hydrocephalus 12/5 Started on free water for hypernatremia. Cefazolin for MSSA pneumonia.  Interim History / Subjective:  Still low grade febrile. Still on Cardene for hypertension.   Objective   Blood pressure 123/63, pulse 76, temperature 99.1 F (37.3 C), temperature source Axillary, resp. rate 20, height 6' (1.829 m), weight 85.2 kg, SpO2 98 %.    Vent Mode: PSV;CPAP FiO2 (%):  [30 %] 30 % Set Rate:  [16 bmp] 16 bmp Vt Set:  [620 mL] 620 mL PEEP:  [5 cmH20] 5 cmH20 Pressure Support:  [8 cmH20] 8 cmH20 Plateau Pressure:  [14 cmH20-20 cmH20] 14 cmH20   Intake/Output Summary (Last 24 hours) at 01/10/2021 0936 Last data filed at 01/10/2021 0600 Gross per 24  hour  Intake 1268.01 ml  Output 3551 ml  Net -2282.99 ml   Filed Weights   12/31/20 1435 01/07/21 0500  Weight: 85.2 kg 85.2 kg    Examination:  General: Obese male poorly responsive HEENT: Pupils are equal downward gaze preference noted Neuro: Does not follow commands. CV: Heart sounds are regular PULM: Coarse rhonchi bilaterally  GI: soft, bsx4 active, 220 GU: Extremities: warm/dry, 1+ edema  Skin: no rashes or lesions    Ancillary tests personally reviewed:     Recent Labs  Lab 01/07/21 0413 01/08/21 0146 01/09/21 0707  NA 152* 153* 149*  K 3.4* 4.8 4.3  CL 121* 126* 120*  CO2 19* 15* 20*  BUN 32* 34* 40*  CREATININE 1.97* 2.06* 1.92*  GLUCOSE 291* 281* 377*   Recent Labs  Lab 01/08/21 0146 01/09/21 0707 01/10/21 0351  HGB 11.6* 10.6* 11.2*  HCT 37.4* 33.4* 35.0*  WBC 12.0* 15.9* 15.6*  PLT 197 260 216     Assessment & Plan:   Critically ill due to left basal ganglia (caudate) hemorrhage with significant intraventricular extension requiring mechanical ventilation for airway protection titration of IV antihypertensive agents. Currently off nicardipine Continue minoxidil Coreg lisinopril Continue aggressive diuresis Goal systolic blood pressure less than 160    Acute hypoxic respiratory failure due to Staphylococcus aureus aspiration pneumonia  Diuresis as tolerated Wean as tolerated Mental status is a block to extubation Suspect he will need trach and PEG if aggressive    Type 2 diabetes CBG (last 3)  Recent Labs    01/09/21 2331 01/10/21 0331  01/10/21 0747  GLUCAP 252* 209* 167*   Sliding-scale insulin protocol Improving with change in insulin      Stage IIIa CKD Lab Results  Component Value Date   CREATININE 1.92 (H) 01/09/2021   CREATININE 2.06 (H) 01/08/2021   CREATININE 1.97 (H) 01/07/2021    Hypernatremia Recent Labs  Lab 01/07/21 0413 01/08/21 0146 01/09/21 0707  NA 152* 153* 149*   Normal saline infusions  stopped Twice daily Lasix Continue to monitor sodium Continue free water      FUO - suspect central etiology Currently on BuSpar  Best Practice (right click and "Reselect all SmartList Selections" daily)   Diet/type: tubefeeds  DVT prophylaxis: prophylactic heparin  GI prophylaxis: PPI Lines: N/A Foley:  Condom catheter Code Status:  full code Family: Family updated at bedside 12/9, son, wife.   CRITICAL CARE App cct 45 min

## 2021-01-11 ENCOUNTER — Inpatient Hospital Stay (HOSPITAL_COMMUNITY): Payer: 59

## 2021-01-11 DIAGNOSIS — Z515 Encounter for palliative care: Secondary | ICD-10-CM | POA: Diagnosis not present

## 2021-01-11 DIAGNOSIS — I161 Hypertensive emergency: Secondary | ICD-10-CM | POA: Diagnosis not present

## 2021-01-11 DIAGNOSIS — I6389 Other cerebral infarction: Secondary | ICD-10-CM | POA: Diagnosis not present

## 2021-01-11 DIAGNOSIS — I610 Nontraumatic intracerebral hemorrhage in hemisphere, subcortical: Secondary | ICD-10-CM | POA: Diagnosis not present

## 2021-01-11 DIAGNOSIS — J9601 Acute respiratory failure with hypoxia: Secondary | ICD-10-CM | POA: Diagnosis not present

## 2021-01-11 LAB — GLUCOSE, CAPILLARY
Glucose-Capillary: 165 mg/dL — ABNORMAL HIGH (ref 70–99)
Glucose-Capillary: 177 mg/dL — ABNORMAL HIGH (ref 70–99)
Glucose-Capillary: 179 mg/dL — ABNORMAL HIGH (ref 70–99)
Glucose-Capillary: 179 mg/dL — ABNORMAL HIGH (ref 70–99)
Glucose-Capillary: 202 mg/dL — ABNORMAL HIGH (ref 70–99)
Glucose-Capillary: 234 mg/dL — ABNORMAL HIGH (ref 70–99)
Glucose-Capillary: 235 mg/dL — ABNORMAL HIGH (ref 70–99)

## 2021-01-11 LAB — BASIC METABOLIC PANEL
Anion gap: 14 (ref 5–15)
BUN: 67 mg/dL — ABNORMAL HIGH (ref 6–20)
CO2: 17 mmol/L — ABNORMAL LOW (ref 22–32)
Calcium: 8.1 mg/dL — ABNORMAL LOW (ref 8.9–10.3)
Chloride: 113 mmol/L — ABNORMAL HIGH (ref 98–111)
Creatinine, Ser: 2.98 mg/dL — ABNORMAL HIGH (ref 0.61–1.24)
GFR, Estimated: 24 mL/min — ABNORMAL LOW (ref >60)
Glucose, Bld: 178 mg/dL — ABNORMAL HIGH (ref 70–99)
Potassium: 4.5 mmol/L (ref 3.5–5.1)
Sodium: 144 mmol/L (ref 135–145)

## 2021-01-11 LAB — CBC WITH DIFFERENTIAL/PLATELET
Abs Immature Granulocytes: 0 10*3/uL (ref 0.00–0.07)
Basophils Absolute: 0.4 10*3/uL — ABNORMAL HIGH (ref 0.0–0.1)
Basophils Relative: 2 %
Eosinophils Absolute: 0.2 10*3/uL (ref 0.0–0.5)
Eosinophils Relative: 1 %
HCT: 35.3 % — ABNORMAL LOW (ref 39.0–52.0)
Hemoglobin: 11.3 g/dL — ABNORMAL LOW (ref 13.0–17.0)
Lymphocytes Relative: 4 %
Lymphs Abs: 0.7 10*3/uL (ref 0.7–4.0)
MCH: 23.8 pg — ABNORMAL LOW (ref 26.0–34.0)
MCHC: 32 g/dL (ref 30.0–36.0)
MCV: 74.5 fL — ABNORMAL LOW (ref 80.0–100.0)
Monocytes Absolute: 0.2 10*3/uL (ref 0.1–1.0)
Monocytes Relative: 1 %
Neutro Abs: 16.2 10*3/uL — ABNORMAL HIGH (ref 1.7–7.7)
Neutrophils Relative %: 92 %
Platelets: 255 10*3/uL (ref 150–400)
RBC: 4.74 MIL/uL (ref 4.22–5.81)
RDW: 14.7 % (ref 11.5–15.5)
WBC: 17.6 10*3/uL — ABNORMAL HIGH (ref 4.0–10.5)
nRBC: 0 % (ref 0.0–0.2)
nRBC: 0 /100 WBC

## 2021-01-11 LAB — HEPATIC FUNCTION PANEL
ALT: 60 U/L — ABNORMAL HIGH (ref 0–44)
AST: 81 U/L — ABNORMAL HIGH (ref 15–41)
Albumin: 1.7 g/dL — ABNORMAL LOW (ref 3.5–5.0)
Alkaline Phosphatase: 80 U/L (ref 38–126)
Bilirubin, Direct: 0.2 mg/dL (ref 0.0–0.2)
Indirect Bilirubin: 0.1 mg/dL — ABNORMAL LOW (ref 0.3–0.9)
Total Bilirubin: 0.3 mg/dL (ref 0.3–1.2)
Total Protein: 5.8 g/dL — ABNORMAL LOW (ref 6.5–8.1)

## 2021-01-11 LAB — AMMONIA: Ammonia: 63 umol/L — ABNORMAL HIGH (ref 9–35)

## 2021-01-11 LAB — PHOSPHORUS: Phosphorus: 5.8 mg/dL — ABNORMAL HIGH (ref 2.5–4.6)

## 2021-01-11 LAB — MAGNESIUM: Magnesium: 2.7 mg/dL — ABNORMAL HIGH (ref 1.7–2.4)

## 2021-01-11 NOTE — Progress Notes (Signed)
   01/11/21 1127  Clinical Encounter Type  Visited With Patient;Health care provider  Visit Type Initial;Critical Care;Spiritual support  Referral From Family   Chaplain responded to a consult. No family is present at this time, and chaplain left a card in the room for them. Chaplain offered prayer and support. Spiritual care services available as needed.   Alda Ponder, Chaplain

## 2021-01-11 NOTE — Progress Notes (Signed)
Palliative Medicine Inpatient Follow Up Note  Consulting Provider: August Albino, NP   Reason for consult:   [place]  Reason for Consult? GOC/EOL decision making with family    HPI:  Per intake H&P --> 55 yo male presented with headache, nausea and altered mental status.  CT head showed acute Lt ICH with IVH likely related to hypertensive emergency (BP 195/116).  Had persistent hypertension and concern for airway protection, intubated.    Neurology shared with patients son and spouse that his neurological exam remains to be poor and his likelihood of recovery is dismal. Palliative care was asked to follow up with additional goals of care conversations.  Today's Discussion (01/11/2021):  *Please note that this is a verbal dictation therefore any spelling or grammatical errors are due to the "[place] [employer]" system interpretation.  Chart reviewed.  I met with patients spouse, Stephen Delgado, son(s), Stephen Delgado, and cousin Stephen Delgado Kitchen.  Providers present were myself and Shelby Mattocks, NP.  A careful review of patients past medical conditions was held inclusive of patients history of uncontrolled hypertension. Images of patients intracranial hemorrhage were presented. Reviewed the process of absorption though the reality that a great amount of damage had already been done.   Discussed that Stephen Delgado has been intubated for the past eight days and that it is getting to a point where a decision on a tracheostomy needs to be made. We reviewed that patients cannot stay intubated indefinitely as they start to experience complications as a result of this.   Discussed PEG tubes - the risks and benefits.   Reviewed that patients kidney function is starting to decline. I asked if they believe the patient would want hemodialysis.  Reviewed cardiopulmonary resuscitation in the context of [place]'s already fragile state.   We reviewed that Stephen Delgado has  endured a severe injury to his brain and even if that injury improves/absorbs that some serious damage has already been done likely prohibiting Stephen Delgado from ever being the man his family knew and loved. I shared that he has a very high likelihood of being in a position whereby he is bed-bound on mechanical ventilation for the rest of his life. I asked his family to consider what is important to him and if he would find a life like that be acceptable.   It sounds like Stephen Delgado was a very independent man. His family shares his strong [place] faith. His cousin expresses that "he would not want to be given up on."  We reviewed that as it stands presently we have two avenues to travel down either aggressive interventions of Trach/Peg/Possible HD and a very likely long term debilitated state. Alternatively to remove incumbering devices and allow nature to takes it's natural process.   Patients spouse and sons ask for time to speak to their other family members. We agreed to allow them two more days for further decision making.   Questions and concerns addressed   Objective Assessment: Vital Signs Vitals:   01/11/21 1300 01/11/21 1511  BP: (!) 115/59   Pulse: 81 76  Resp: (!) 21 17  Temp:    SpO2: 94% 96%    Intake/Output Summary (Last 24 hours) at 01/11/2021 1630 Last data filed at 01/11/2021 1300 Gross per 24 hour  Intake 2225.08 ml  Output 1450 ml  Net 775.08 ml   Last Weight  Most recent update: 01/07/2021  5:27 AM    Weight  85.2 kg (187 lb 13.3  oz)            Gen:  Exceptionally ill Middle aged AA M HEENT: ETT, (+) coretrack, dry mucous membranes CV: Regular rate and rhythm  PULM: On mechanical ventilator ABD: soft/nontender  EXT: Generalized edema  Neuro: Sedated  Summary and Recommendations: Full Code/ Full Scope of care for the time being --> Discussed with family concept of DNR   Patients family request additional time to consider the options of Tracheostomy/Peg/HD (if  needed)  Patients spouse requests Chaplain for prayer with the patient   Ongoing PMT support given patients critical state  Plan for another Palliative Care meeting on Tuesday - time is not yet known. I will not be present though I will request one of my colleagues be present  Time Spent: 85 minutes Greater than 50% of the time was spent in counseling and coordination of care ______________________________________________________________________________________ [place] Team Team Cell Phone: [phone] Please utilize secure chat with additional questions, if there is no response within 30 minutes please call the above phone number  Palliative Medicine Team providers are available by phone from 7am to 7pm daily and can be reached through the team cell phone.  Should this patient require assistance outside of these hours, please call the patient's attending physician.

## 2021-01-11 NOTE — Plan of Care (Signed)
Attended family meeting for discussion of goals of care with palliative care.  Patient's wife [name], two sons [name] and Denilson and cousin Sharlet Salina were present.  Discussed patient's diagnosis and reviewed brain imaging with patient's family.  Family members state that they are not ready to make a decision on goals of care today and that they need to discuss the matter with other family members.  Will plan for another family meeting on Tuesday.

## 2021-01-11 NOTE — Progress Notes (Addendum)
STROKE TEAM PROGRESS NOTE   INTERVAL HISTORY Patient is seen in his room with no family at the bedside today.  He has been hemodynamically stable and has had no acute events overnight. He continues to be febrile with Tmax 101.6. His neurological exam remains unchanged.  GOC conversation to be held with family and palliative care at 1500 today.  Vitals:   01/11/21 0753 01/11/21 0759 01/11/21 0800 01/11/21 0848  BP:   120/60   Pulse:  77 76 75  Resp:  (!) 26 (!) 25 (!) 23  Temp: 99.5 F (37.5 C)     TempSrc: Oral     SpO2:  95% 94% 95%  Weight:      Height:       CBC:  Recent Labs  Lab 01/09/21 0707 01/10/21 0351  WBC 15.9* 15.6*  NEUTROABS 14.6* 13.8*  HGB 10.6* 11.2*  HCT 33.4* 35.0*  MCV 75.6* 74.8*  PLT 260 216    Basic Metabolic Panel:  Recent Labs  Lab 01/10/21 0807 01/11/21 0323  NA 148* 144  K 3.5 4.5  CL 119* 113*  CO2 20* 17*  GLUCOSE 185* 178*  BUN 49* 67*  CREATININE 2.20* 2.98*  CALCIUM 8.4* 8.1*  MG  --  2.7*  PHOS  --  5.8*    Lipid Panel:  Recent Labs  Lab 01/09/21 0707  TRIG 157*     IMAGING past 24 hours DG Chest Port 1 View  Result Date: 01/11/2021 CLINICAL DATA:  55 year old male currently intubated EXAM: PORTABLE CHEST 1 VIEW COMPARISON:  Prior chest x-ray 01/08/2021 FINDINGS: Tip of the endotracheal tube is 4.6 cm above the carina. A feeding tube is present, the tip is not visible and lies off the field of view, presumably within the stomach or small intestine. Cardiac and mediastinal contours are within normal limits. Perhaps mild right lower lobe atelectasis. Otherwise, the lungs are clear. No acute osseous abnormality. IMPRESSION: Improved aeration with decreased pulmonary vascular congestion and improving bibasilar atelectasis. Stable and satisfactory support apparatus. Electronically Signed   By: Malachy Moan M.D.   On: 01/11/2021 07:55    PHYSICAL EXAM General:  Patient is a well-developed, well-nourished middle-aged  African-American male who is intubated on Precedex gtt.  Respiratory: Patient is intubated, on ventilator and tachypneic  Neurological:Patient will not respond to voice or open eyes to sternal rub.  Corneal reflexes present bilaterally, cough reflex present. Right gaze deviation noted when attempting to elicit doll's eyes reflex, patient not able to overcome..  Patient does not move extremities in response to noxious stimuli, negative Babinski reflex.   ASSESSMENT/PLAN Stephen Delgado is a 55 y.o. male with history of HTN (questionable compliance with medications), DM and CKD 3 presenting with headache and altered mental status. 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  He was hypertensive on arrival to 238/160 and was given Cleviprex for BP control.  He was also agitated and required restraints.  Repeat head CT 11/30 shows unchanged IPH with IVH  Head CTs done on 12/1 and 12/3 also demonstrates unchanged IPH with IVH.  MRI/MRA completed on 12/4.Patient's neurological exam continues to be poor, and GOC conversation with palliative care and patient's family is scheduled for today at 1500.  ICH score 2  ICH with IVH - left basal ganglia ICH with IVH likely secondary to hypertensive emergency  bilateral lacunar infarcts - likely small vessel disease vs. Strict BP control in the  setting of ICH CT head Large basal ganglia IPH with IVH involving the lateral, third and fourth ventricles with no significant mass effect or midline shift.  Repeat CT 11/30 0116 Unchanged IPH with IVH Repeat CT head 11/30 1629  Stable IPH with IVH, stable mild hydrocephalus Repeat CT Head 12/6 - Unchanged IVH and left caudate head intraparenchymal component. No new site of hemorrhage. 2D Echo EF 99991111, grade 1 diastolic dysfunction, no atrial level shunt MRI-10/3-Numerous small acute infarcts in bilateral cerebral hemispheres. Minimal involvement of the right superior cerebellum.  Left  caudate hemorrhage, intraventricular hemorrhage, and scattered sulcal subarachnoid hemorrhage as seen on recent CT imaging. Associated mild communicating hydrocephalus. Numerous chronic microhemorrhages probably secondary to hypertension. Chronic microvascular ischemic changes MRA head and neck 10/4- Mildly tortuous cervical carotid and vertebral arteries without stenosis. Intracranial hemorrhage and ventricles appears stable from the MRI yesterday. CUS unremarkable LDL 111.4 HgbA1c 6.3 VTE prophylaxis - SCDs aspirin 81 mg daily prior to admission, now on No antithrombotic secondary to [place] Therapy recommendations:  SNF Disposition:  pending  Respiratory distress Intubated and ventilated CCM on board Tachypnea- PRN fentanyl and Versed ETT exchanged 12/4 due to a cuff leak Precedex for sedation currently running. Prop is on hold  Hypertensive emergency: improving. Home meds:  Amlodipine 10 mg daily, metoprolol succinate 25 mg daily,  Unstable No longer requiring Cardene to maintain BP at goal.  SBP goal <160 Currently On norvasc 10mg , coreg 25mg , Lisinopril 40mg , Clonidine 0.3 mg q8h and minoxidil 10mg  daily Long-term BP goal normotensive  Hyperlipidemia Home meds:  none LDL 111.4, goal < 70 Hold off statin for now due to acute ICH Consider statin at discharge         AKI on CKD Cre 2.98 today On TF Close monitoring I/O CCM on board        Other Stroke Risk Factors Questionable compliance with medications at home        Other Active Problems Code status - full code Hypokalemia 3.1 -> 3.4 -> 3.5-> 4.5 Hypernatremia- 153->152->149->148-> 144  - free water 200 ml Q 2 hours Ileus seen on KUB- fleet enema given 12/1; bowel regimen in place Aspiration pneumonia -> Ancef started 12/5 per CCM - temp 101.6 - WBCs 20.5->12.2->15.9-> 15.6 Anemia - Hb 13.0->10.8->10.6-> 11.2  Hospital day # [place] , MSN, AGACNP-BC [facility] See Amion for  schedule and pager information 01/11/2021 10:36 AM  ATTENDING NOTE: I reviewed above note and agree with the assessment and plan. Pt was seen and examined.   RN at bedside.  No family at bedside.  Patient still intubated on ventilation, neuro unchanged.  Still has intermittent fever and antibiotics completed yesterday.  Sodium level improved, however trending continue to worsen.  On free water and tube feeding. Plan to have family meeting this afternoon for [place] discussion.    For detailed assessment and plan, please refer to above as I have made changes wherever appropriate.   Rosalin Hawking, MD PhD Stroke Neurology 01/11/2021 3:12 PM  This patient is critically ill due to [place], IVH, multifocal infarcts, hypertensive emergency, fever and at significant risk of neurological worsening, death form ICH, obstructive hydrocephalus, cerebral edema, seizure, sepsis. This patient's care requires constant monitoring of vital signs, hemodynamics, respiratory and cardiac monitoring, review of multiple databases, neurological assessment, discussion with family, other specialists and medical decision making of high complexity. I spent 35 minutes of neurocritical care time in the care of this patient.  To contact Stroke Continuity provider, please refer to [email or web address] After hours, contact General Neurology

## 2021-01-11 NOTE — Progress Notes (Signed)
   NAME:  Stephen Delgado, MRN:  [id number], DOB:  1966/01/01, LOS: 12 ADMISSION DATE:  12/30/2020, CONSULTATION DATE:  01/01/21 REFERRING MD:  xu (stroke), CHIEF COMPLAINT:  respiratory failure   History of Present Illness:  55 yo male presented with headache, nausea and altered mental status.  CT head showed acute Lt ICH with IVH likely related to hypertensive emergency (BP 195/116).  Had persistent hypertension and concern for airway protection, and PCCM asked to assist with ICU management.  Pertinent  Medical History  HTN, DM type 2, CKD 3a, OSA  Significant Hospital Events:  11/29 Admit, start cleviprex 12/01 PCCM consulted; changed from cleviprex to cardene due to elevated triglycerides 12/03 intubated; episode of vomiting >> tube feeds held 12/04 resume trickle tube feeds 12/06 remains minimally responsive. Tolerating SBT.   Studies:  CT head 11/29 >> large amount of IVH involving lateral/3rd/4th ventricles, ICH in Lt BG, chronic microvascular ischemic changes Echo 11/30 >> EF 50 to 55%, mod LVH, grade 1 DD, ascending aorta 37 mm EEG 12/01 >> continuous generalized slowing MRI brain 12/03 >> numerous small acute infarcts in b/l cerebral hemispheres, Lt caudate hemorrhage, IVH, scattered sulcal SAH, mild communicating hydrocephalus 12/5 Started on free water for hypernatremia. Cefazolin for MSSA pneumonia.  Interim History / Subjective:  Off cardene. PS trial ongoing.  Objective   Blood pressure 120/60, pulse 75, temperature 99.5 F (37.5 C), temperature source Oral, resp. rate (!) 23, height 6' (1.829 m), weight 85.2 kg, SpO2 95 %.    Vent Mode: PRVC FiO2 (%):  [30 %] 30 % Set Rate:  [16 bmp] 16 bmp Vt Set:  [620 mL] 620 mL PEEP:  [5 cmH20] 5 cmH20 Pressure Support:  [5 cmH20] 5 cmH20   Intake/Output Summary (Last 24 hours) at 01/11/2021 0853 Last data filed at 01/11/2021 0800 Gross per 24 hour  Intake 2130.91 ml  Output 2300 ml  Net -169.09 ml    Filed Weights    12/31/20 1435 01/07/21 0500  Weight: 85.2 kg 85.2 kg    Examination: Poorly responsive man on vent Pupils downward gaze preference GCS3 Diffuse anasarca Triggers vent, +cough  Renal function worse CBC stable   Assessment & Plan:  Hypertensive emergency Left basal ganglia hemorrhage resulting in inability to protect airway and respiratory failure MSSA PNA DM2 with hyperglycemia Worsening AKI, hyperntremia Intermittent fevers suspect central Acute liver injury Relative protein calorie malnutrition  - Stop ACE-I and lasix, watch renal function closely - Continue vent support with PS trials, not candidate for extubation at present - Complete course of cefazolin - Continue levemir and SSI - Continue current aggressive BP regimen - Family meeting today to decide on comfort vs. Trach/PEG/LTACH  Best Practice (right click and "Reselect all SmartList Selections" daily)   Diet/type: tubefeeds  DVT prophylaxis: prophylactic heparin  GI prophylaxis: PPI Lines: N/A Foley:  Condom catheter Code Status:  full code Family: per primary and palliative   Patient critically ill due to respiratory failure Interventions to address this today vent titration Risk of deterioration without these interventions is high  I personally spent 33 minutes providing critical care not including any separately billable procedures  Myrla Halsted MD [facility]  Prefer epic messenger for cross cover needs If after hours, please call E-link

## 2021-01-12 DIAGNOSIS — Z515 Encounter for palliative care: Secondary | ICD-10-CM | POA: Diagnosis not present

## 2021-01-12 DIAGNOSIS — I161 Hypertensive emergency: Secondary | ICD-10-CM | POA: Diagnosis not present

## 2021-01-12 DIAGNOSIS — I615 Nontraumatic intracerebral hemorrhage, intraventricular: Secondary | ICD-10-CM | POA: Diagnosis not present

## 2021-01-12 DIAGNOSIS — J9601 Acute respiratory failure with hypoxia: Secondary | ICD-10-CM | POA: Diagnosis not present

## 2021-01-12 DIAGNOSIS — I610 Nontraumatic intracerebral hemorrhage in hemisphere, subcortical: Secondary | ICD-10-CM | POA: Diagnosis not present

## 2021-01-12 DIAGNOSIS — N1832 Chronic kidney disease, stage 3b: Secondary | ICD-10-CM | POA: Diagnosis not present

## 2021-01-12 DIAGNOSIS — Z978 Presence of other specified devices: Secondary | ICD-10-CM | POA: Diagnosis not present

## 2021-01-12 DIAGNOSIS — I619 Nontraumatic intracerebral hemorrhage, unspecified: Secondary | ICD-10-CM | POA: Diagnosis not present

## 2021-01-12 DIAGNOSIS — Z789 Other specified health status: Secondary | ICD-10-CM

## 2021-01-12 LAB — HEPATIC FUNCTION PANEL
ALT: 36 U/L (ref 0–44)
AST: 56 U/L — ABNORMAL HIGH (ref 15–41)
Albumin: 1.6 g/dL — ABNORMAL LOW (ref 3.5–5.0)
Alkaline Phosphatase: 73 U/L (ref 38–126)
Bilirubin, Direct: <0.1 mg/dL (ref 0.0–0.2)
Total Bilirubin: 0.4 mg/dL (ref 0.3–1.2)
Total Protein: 5.6 g/dL — ABNORMAL LOW (ref 6.5–8.1)

## 2021-01-12 LAB — BASIC METABOLIC PANEL
Anion gap: 10 (ref 5–15)
BUN: 74 mg/dL — ABNORMAL HIGH (ref 6–20)
CO2: 22 mmol/L (ref 22–32)
Calcium: 7.9 mg/dL — ABNORMAL LOW (ref 8.9–10.3)
Chloride: 110 mmol/L (ref 98–111)
Creatinine, Ser: 2.45 mg/dL — ABNORMAL HIGH (ref 0.61–1.24)
GFR, Estimated: 30 mL/min — ABNORMAL LOW (ref >60)
Glucose, Bld: 192 mg/dL — ABNORMAL HIGH (ref 70–99)
Potassium: 3.6 mmol/L (ref 3.5–5.1)
Sodium: 142 mmol/L (ref 135–145)

## 2021-01-12 LAB — GLUCOSE, CAPILLARY
Glucose-Capillary: 204 mg/dL — ABNORMAL HIGH (ref 70–99)
Glucose-Capillary: 215 mg/dL — ABNORMAL HIGH (ref 70–99)
Glucose-Capillary: 170 mg/dL — ABNORMAL HIGH (ref 70–99)
Glucose-Capillary: 183 mg/dL — ABNORMAL HIGH (ref 70–99)
Glucose-Capillary: 161 mg/dL — ABNORMAL HIGH (ref 70–99)
Glucose-Capillary: 198 mg/dL — ABNORMAL HIGH (ref 70–99)

## 2021-01-12 LAB — CBC WITH DIFFERENTIAL/PLATELET
Abs Immature Granulocytes: 0.18 10*3/uL — ABNORMAL HIGH (ref 0.00–0.07)
Basophils Absolute: 0 10*3/uL (ref 0.0–0.1)
Basophils Relative: 0 %
Eosinophils Absolute: 0.3 10*3/uL (ref 0.0–0.5)
Eosinophils Relative: 2 %
HCT: 34.5 % — ABNORMAL LOW (ref 39.0–52.0)
Hemoglobin: 11.2 g/dL — ABNORMAL LOW (ref 13.0–17.0)
Immature Granulocytes: 1 %
Lymphocytes Relative: 5 %
Lymphs Abs: 0.9 10*3/uL (ref 0.7–4.0)
MCH: 24 pg — ABNORMAL LOW (ref 26.0–34.0)
MCHC: 32.5 g/dL (ref 30.0–36.0)
MCV: 73.9 fL — ABNORMAL LOW (ref 80.0–100.0)
Monocytes Absolute: 0.6 10*3/uL (ref 0.1–1.0)
Monocytes Relative: 3 %
Neutro Abs: 16.1 10*3/uL — ABNORMAL HIGH (ref 1.7–7.7)
Neutrophils Relative %: 89 %
Platelets: 229 10*3/uL (ref 150–400)
RBC: 4.67 MIL/uL (ref 4.22–5.81)
RDW: 14.9 % (ref 11.5–15.5)
WBC: 18.1 10*3/uL — ABNORMAL HIGH (ref 4.0–10.5)
nRBC: 0 % (ref 0.0–0.2)

## 2021-01-12 LAB — TRIGLYCERIDES: Triglycerides: 139 mg/dL (ref <150)

## 2021-01-12 MED ORDER — CHLORHEXIDINE GLUCONATE 0.12 % MT SOLN
OROMUCOSAL | Status: AC
  Administered 2021-01-12: 15 mL via OROMUCOSAL
  Filled 2021-01-12: qty 15

## 2021-01-12 MED ORDER — INSULIN DETEMIR 100 UNIT/ML ~~LOC~~ SOLN
20.0000 [IU] | Freq: Two times a day (BID) | SUBCUTANEOUS | Status: DC
  Administered 2021-01-12 – 2021-02-22 (×82): 20 [IU] via SUBCUTANEOUS
  Filled 2021-01-12 (×89): qty 0.2

## 2021-01-12 MED ORDER — INSULIN ASPART 100 UNIT/ML IJ SOLN
5.0000 [IU] | INTRAMUSCULAR | Status: DC
  Administered 2021-01-12 – 2021-01-22 (×54): 5 [IU] via SUBCUTANEOUS

## 2021-01-12 NOTE — Progress Notes (Addendum)
[place] referral [phone].

## 2021-01-12 NOTE — Progress Notes (Addendum)
STROKE TEAM PROGRESS NOTE   INTERVAL HISTORY Patient is seen in his room with his brother at the bedside today.  Patient remained hemodynamically stable overnight.  Patient is afebrile.  Neurological status remains unchanged.  Family meeting tomorrow at 10 AM.  It seems that family wants to proceed with trach and PEG but final decision will be made tomorrow.  Vitals:   01/12/21 1117 01/12/21 1200 01/12/21 1300 01/12/21 1400  BP: (!) 107/59 (!) 114/57 (!) 122/52 (!) 120/58  Pulse: 83 82 86 94  Resp: (!) 21 15 (!) 21 17  Temp:  (!) 101 F (38.3 C)    TempSrc:  Axillary    SpO2: 97% 97% 96% 97%  Weight:      Height:       CBC:  Recent Labs  Lab 01/10/21 0351 01/11/21 1202  WBC 15.6* 17.6*  NEUTROABS 13.8* 16.2*  HGB 11.2* 11.3*  HCT 35.0* 35.3*  MCV 74.8* 74.5*  PLT 216 [id number]    Basic Metabolic Panel:  Recent Labs  Lab 01/10/21 0807 01/11/21 0323  NA 148* 144  K 3.5 4.5  CL 119* 113*  CO2 20* 17*  GLUCOSE 185* 178*  BUN 49* 67*  CREATININE 2.20* 2.98*  CALCIUM 8.4* 8.1*  MG  --  2.7*  PHOS  --  5.8*    Lipid Panel:  Recent Labs  Lab 01/12/21 1301  TRIG 139     IMAGING past 24 hours No results found.  PHYSICAL EXAM General:  Patient is a well-developed, well-nourished middle-aged African-American male who is intubated.  Respiratory: Patient is intubated, on ventilator and tachypneic  Neurological:Patient will not respond to voice or open eyes to sternal rub.  Corneal reflexes present bilaterally, cough reflex present. Right gaze deviation noted when attempting to elicit doll's eyes reflex, patient not able to overcome.  Minimal doll's eyes reflex present.  Patient decerebrate posturing noted with noxious stimuli to upper extremities.  Negative Babinski reflex  ASSESSMENT/PLAN [name] is a 55 y.o. male with history of HTN (questionable compliance with medications), DM and CKD 3 presenting with headache and altered mental status. He was taken to  the ED and found to have a left basal ganglia ICH with IVH.  He was hypertensive on arrival to 238/160 and was given Cleviprex for BP control.  He was also agitated and required restraints.  Repeat head CT 11/30 shows unchanged IPH with IVH  Head CTs done on 12/1 and 12/3 also demonstrates unchanged IPH with IVH.  MRI/MRA completed on 12/4.patient's neurological exam continues to remain the same.  Goals of care conversation happened yesterday with another another conversation scheduled for tomorrow, 12/13, for final decision from family related to trach and PEG.  ICH score 2  ICH with IVH - left basal ganglia ICH with IVH likely secondary to hypertensive emergency  bilateral lacunar infarcts - likely small vessel disease vs. Strict BP control in the setting of ICH CT head Large basal ganglia IPH with IVH involving the lateral, third and fourth ventricles with no significant mass effect or midline shift.  Repeat CT 11/30 0116 Unchanged IPH with IVH Repeat CT head 11/30 1629  Stable IPH with IVH, stable mild hydrocephalus Repeat CT Head 12/6 - Unchanged IVH and left caudate head intraparenchymal component. No new site of hemorrhage. 2D Echo EF 99991111, grade 1 diastolic dysfunction, no atrial level shunt MRI-10/3-Numerous small acute infarcts in bilateral cerebral hemispheres. Minimal involvement of the right superior cerebellum.  Left caudate hemorrhage,  intraventricular hemorrhage, and scattered sulcal subarachnoid hemorrhage as seen on recent CT imaging. Associated mild communicating hydrocephalus. Numerous chronic microhemorrhages probably secondary to hypertension. Chronic microvascular ischemic changes MRA head and neck 10/4- Mildly tortuous cervical carotid and vertebral arteries without stenosis. Intracranial hemorrhage and ventricles appears stable from the MRI yesterday. CUS unremarkable LDL 111.4 HgbA1c 6.3 VTE prophylaxis - SCDs aspirin 81 mg daily prior to admission, now on No  antithrombotic secondary to IPH Therapy recommendations:  SNF Disposition:  pending  Respiratory distress Intubated and ventilated CCM on board Tachypnea- PRN fentanyl and Versed ETT exchanged 12/4 due to a cuff leak Precedex for sedation currently running. Prop is on hold  Hypertensive emergency: improving. Home meds:  Amlodipine 10 mg daily, metoprolol succinate 25 mg daily,  Unstable No longer requiring Cardene to maintain BP at goal.  SBP goal <160 Currently On norvasc 10mg , coreg 25mg , Lisinopril 40mg , Clonidine 0.3 mg q8h and minoxidil 10mg  daily Long-term BP goal normotensive  Hyperlipidemia Home meds:  none LDL 111.4, goal < 70 Hold off statin for now due to acute ICH Consider statin at discharge         AKI on CKD Cre 2.98->2.45 today On TF Close monitoring I/O CCM on board        Other Stroke Risk Factors Questionable compliance with medications at home        Other Active Problems Code status - full code Hypokalemia 3.1 -> 3.4 -> 3.5-> 4.5->3.6 Hypernatremia- 153->152->149->148-> 144->142  - free water 200 ml Q 2 hours Ileus seen on KUB- fleet enema given 12/1; bowel regimen in place Aspiration pneumonia -> Ancef started 12/5 per CCM - temp 97.5- WBCs 20.5->12.2->15.9-> 15.6->17.6->18.1 Anemia - Hb 13.0->10.8->10.6-> 11.2->11.3->11.2  Hospital day # 13  Patient seen and examined by NP/APP with MD. MD to update note as needed.   , DNP, FNP-BC [facility] Pager: [phone]  ATTENDING NOTE: I reviewed above note and agree with the assessment and plan. Pt was seen and examined.   Patient still intubated, on ventilation.  Vital stable, AKI and leukocytosis slightly improved, afebrile this morning.  Withdraw bilateral upper extremity on pain sedation, no movement lateral lower extremity with pain.  Otherwise neurologically unchanged.  Brother, who is respiratory therapist, at bedside.  Discussed with him about patient's evaluation  and to GOC, he was leaning towards to trach and PEG.  Later in the afternoon, patient son informed treatment team that family decided trach and PEG.  Dr. 14/1 CCM scheduled tracheostomy tomorrow.  For detailed assessment and plan, please refer to above as I have made changes wherever appropriate.   14/5, MD PhD Stroke Neurology 01/12/2021 8:04 PM  This patient is critically ill due to ICH, IVH, multifocal infarcts, hypertensive emergency, pneumonia and at significant risk of neurological worsening, death form ICH, hematoma expansion, cerebral edema, seizure, sepsis, obstructive hydrocephalus. This patient's care requires constant monitoring of vital signs, hemodynamics, respiratory and cardiac monitoring, review of multiple databases, neurological assessment, discussion with family, other specialists and medical decision making of high complexity. I spent 35 minutes of neurocritical care time in the care of this patient. I had long discussion with brother at bedside, updated pt current condition, treatment plan and potential prognosis, and answered all the questions.  He expressed understanding and appreciation.      To contact Stroke Continuity provider, please refer to [phone]. After hours, contact General Neurology

## 2021-01-12 NOTE — Progress Notes (Addendum)
Progress Note:  HPI:  Patient is a 55 year old male that presented with left basal ganglia intraparenchymal hemorrhage with IVH.    Subjective: Patient is currently intubated and not on any sedation.  Tube feeds running. Patient is unresponsive to verbal and tactile stimuli.  No family at bedside presently.  Plan: As per GOC meeting on 12/11 with my colleague NP Sarina Ill, family asked for two days to discuss patient's prognosis and decisions moving forward regarding trach/peg/HD. As planned, PMT will f/u with family on 12/13.  If pt declines or family requests before this time, please contact PMT.  Afternoon update: 1315: Received message from son to call him. Son [name] shares that he and patient's spouse [name] have decided to move forward with placing a trach and PEG. CCM NP Harris and nursing made aware of family's wishes to proceed with tracheostomy and PEG tube placement.   Questions and concerns from son were addressed.  PMT will continue to follow the patient throughout his hospitalization.    15 minutes  Melisa Donofrio L. Manon Hilding, FNP-BC Palliative Medicine Team Team Phone # [phone]

## 2021-01-12 NOTE — Progress Notes (Signed)
NAME:  Stephen Delgado, MRN:  [id number], DOB:  October 03, 1965, LOS: 13 ADMISSION DATE:  12/30/2020, CONSULTATION DATE:  01/01/21 REFERRING MD:  xu (stroke), CHIEF COMPLAINT:  respiratory failure   History of Present Illness:  55 yo male presented with headache, nausea and altered mental status.  CT head showed acute Lt ICH with IVH likely related to hypertensive emergency (BP 195/116).  Had persistent hypertension and concern for airway protection, and PCCM asked to assist with ICU management.  Pertinent  Medical History  HTN, DM type 2, CKD 3a, OSA  Significant Hospital Events:  11/29 Admit, start cleviprex 12/01 PCCM consulted; changed from cleviprex to cardene due to elevated triglycerides 12/03 intubated; episode of vomiting >> tube feeds held 12/04 resume trickle tube feeds 12/06 remains minimally responsive. Tolerating SBT.  12/12 no acute events overnight, T-max 102 point  Studies:  CT head 11/29 >> large amount of IVH involving lateral/3rd/4th ventricles, ICH in Lt BG, chronic microvascular ischemic changes Echo 11/30 >> EF 50 to 55%, mod LVH, grade 1 DD, ascending aorta 37 mm EEG 12/01 >> continuous generalized slowing MRI brain 12/03 >> numerous small acute infarcts in b/l cerebral hemispheres, Lt caudate hemorrhage, IVH, scattered sulcal SAH, mild communicating hydrocephalus 12/5 Started on free water for hypernatremia. Cefazolin for MSSA pneumonia.  Interim History / Subjective:  Unresponsive unresponsive to painful or verbal stimuli this a.m. Remains 19 L positive  Objective   Blood pressure (!) 107/55, pulse 70, temperature 98.3 F (36.8 C), temperature source Axillary, resp. rate 18, height 6' (1.829 m), weight 85.2 kg, SpO2 98 %.    Vent Mode: PRVC FiO2 (%):  [30 %] 30 % Set Rate:  [16 bmp] 16 bmp Vt Set:  [620 mL] 620 mL PEEP:  [5 cmH20] 5 cmH20 Pressure Support:  [10 cmH20-14 cmH20] 10 cmH20 Plateau Pressure:  [16 cmH20-23 cmH20] 23 cmH20   Intake/Output  Summary (Last 24 hours) at 01/12/2021 0801 Last data filed at 01/12/2021 0600 Gross per 24 hour  Intake 1730.39 ml  Output 1350 ml  Net 380.39 ml    Filed Weights   12/31/20 1435 01/07/21 0500  Weight: 85.2 kg 85.2 kg    Examination: General: Acute on chronic ill-appearing elderly gentleman lying in bed on mechanical ventilation in no acute distress  HEENT: ETT, MM pink/moist, PERRL,  Neuro: Unresponsive on ventilator, downward gaze, pupils responsive to light CV: s1s2 regular rate and rhythm, no murmur, rubs, or gallops,  PULM: Clear to auscultation bilaterally, no increased work of breathing, tolerating ventilator GI: soft, bowel sounds active in all 4 quadrants, non-tender, non-distended, tolerating TF Extremities: warm/dry, 2+ pitting throughout Skin: no rashes or lesions  Assessment & Plan:  Hypertensive emergency -Blood pressure 238/160 on admission P: No longer requiring Cardene drip SBP goal less than 160 Continue Norvasc, Coreg, lisinopril, clonidine, and minoxidil Continuous telemetry  Acute ICH and IVH -Left basal ganglia ICH and IVH likely secondary to hypertensive emergency Bilateral lacunar infarcts -MRI 12/3 numerous small acute infarcts in bilateral cerebral hemispheres P: Management per neurology  Maintain neuro protective measures; goal for eurothermia, euglycemia, eunatermia, normoxia, and PCO2 goal of 35-40 Nutrition and bowel regiment  Seizure precautions  Aspirations precautions  Strict blood pressure control per neurology  Acute respiratory failure secondary to left basal ganglia hemorrhage  MSSA PNA P: Continue ventilator support with lung protective strategies  Wean PEEP and FiO2 for sats greater than 90%. Head of bed elevated 30 degrees. Plateau pressures less than 30 cm H20.  Follow intermittent chest x-ray and ABG.   SAT/SBT as tolerated, mentation preclude extubation  Ensure adequate pulmonary hygiene  Follow cultures  VAP bundle in  place  PAD protocol  DM2 with hyperglycemia -CBG continues to remain P: Continue SSI  Increased TF coverage  Continue long acting insulin  CBG goal 140-180  Worsening AKI Hypernitremia -Creatinine 1.49 02/06/2020, creatinine currently 2.98 with GFR 24.  Adequate urine output with 1.3 L on the 20 last 24 hours Significant volume overload approximate +19 L thus far during admission P: Follow renal function  Monitor urine output Trend Bmet Avoid nephrotoxins Ensure adequate renal perfusion   Intermittent fevers suspect central -T-max 102.1 overnight P: Supportive care Continue to trend fever curve and WBC  Acute liver injury -LFTs slightly improved P: Avoid hepatotoxins Supportive care  Relative protein calorie malnutrition P: Protein supplementation  Goals of care Palliative care following to assist in adequate development of goals of care with family.   Best Practice (right click and "Reselect all SmartList Selections" daily)   Diet/type: tubefeeds  DVT prophylaxis: prophylactic heparin  GI prophylaxis: PPI Lines: N/A Foley:  Condom catheter Code Status:  full code Family: per primary and palliative  CRITICAL CARE Performed by: Renji Berwick D. Harris   Total critical care time: 40 minutes  Critical care time was exclusive of separately billable procedures and treating other patients.  Critical care was necessary to treat or prevent imminent or life-threatening deterioration.  Critical care was time spent personally by me on the following activities: development of treatment plan with patient and/or surrogate as well as nursing, discussions with consultants, evaluation of patient's response to treatment, examination of patient, obtaining history from patient or surrogate, ordering and performing treatments and interventions, ordering and review of laboratory studies, ordering and review of radiographic studies, pulse oximetry and re-evaluation of patient's  condition.  Treina Arscott D. Kenton Kingfisher, NP-C [facility] Personal contact information can be found on Amion  01/12/2021, 8:14 AM

## 2021-01-13 ENCOUNTER — Inpatient Hospital Stay (HOSPITAL_COMMUNITY): Payer: 59

## 2021-01-13 DIAGNOSIS — I610 Nontraumatic intracerebral hemorrhage in hemisphere, subcortical: Secondary | ICD-10-CM | POA: Diagnosis not present

## 2021-01-13 DIAGNOSIS — E870 Hyperosmolality and hypernatremia: Secondary | ICD-10-CM

## 2021-01-13 DIAGNOSIS — I615 Nontraumatic intracerebral hemorrhage, intraventricular: Secondary | ICD-10-CM | POA: Diagnosis not present

## 2021-01-13 DIAGNOSIS — Z4659 Encounter for fitting and adjustment of other gastrointestinal appliance and device: Secondary | ICD-10-CM

## 2021-01-13 DIAGNOSIS — Z978 Presence of other specified devices: Secondary | ICD-10-CM | POA: Diagnosis not present

## 2021-01-13 DIAGNOSIS — I619 Nontraumatic intracerebral hemorrhage, unspecified: Secondary | ICD-10-CM | POA: Diagnosis not present

## 2021-01-13 DIAGNOSIS — J9601 Acute respiratory failure with hypoxia: Secondary | ICD-10-CM | POA: Diagnosis not present

## 2021-01-13 LAB — CBC WITH DIFFERENTIAL/PLATELET
Abs Immature Granulocytes: 0.1 10*3/uL — ABNORMAL HIGH (ref 0.00–0.07)
Basophils Absolute: 0 10*3/uL (ref 0.0–0.1)
Basophils Relative: 0 %
Eosinophils Absolute: 0.2 10*3/uL (ref 0.0–0.5)
Eosinophils Relative: 1 %
HCT: 30.3 % — ABNORMAL LOW (ref 39.0–52.0)
Hemoglobin: 10.1 g/dL — ABNORMAL LOW (ref 13.0–17.0)
Immature Granulocytes: 1 %
Lymphocytes Relative: 6 %
Lymphs Abs: 0.9 10*3/uL (ref 0.7–4.0)
MCH: 24.3 pg — ABNORMAL LOW (ref 26.0–34.0)
MCHC: 33.3 g/dL (ref 30.0–36.0)
MCV: 72.8 fL — ABNORMAL LOW (ref 80.0–100.0)
Monocytes Absolute: 0.6 10*3/uL (ref 0.1–1.0)
Monocytes Relative: 4 %
Neutro Abs: 13 10*3/uL — ABNORMAL HIGH (ref 1.7–7.7)
Neutrophils Relative %: 88 %
Platelets: 261 10*3/uL (ref 150–400)
RBC: 4.16 MIL/uL — ABNORMAL LOW (ref 4.22–5.81)
RDW: 14.8 % (ref 11.5–15.5)
WBC: 14.9 10*3/uL — ABNORMAL HIGH (ref 4.0–10.5)
nRBC: 0 % (ref 0.0–0.2)

## 2021-01-13 LAB — BASIC METABOLIC PANEL
Anion gap: 11 (ref 5–15)
BUN: 79 mg/dL — ABNORMAL HIGH (ref 6–20)
CO2: 21 mmol/L — ABNORMAL LOW (ref 22–32)
Calcium: 7.9 mg/dL — ABNORMAL LOW (ref 8.9–10.3)
Chloride: 111 mmol/L (ref 98–111)
Creatinine, Ser: 2.66 mg/dL — ABNORMAL HIGH (ref 0.61–1.24)
GFR, Estimated: 27 mL/min — ABNORMAL LOW (ref >60)
Glucose, Bld: 126 mg/dL — ABNORMAL HIGH (ref 70–99)
Potassium: 3.1 mmol/L — ABNORMAL LOW (ref 3.5–5.1)
Sodium: 143 mmol/L (ref 135–145)

## 2021-01-13 LAB — HEPATIC FUNCTION PANEL
ALT: 38 U/L (ref 0–44)
AST: 54 U/L — ABNORMAL HIGH (ref 15–41)
Albumin: 1.6 g/dL — ABNORMAL LOW (ref 3.5–5.0)
Alkaline Phosphatase: 64 U/L (ref 38–126)
Bilirubin, Direct: <0.1 mg/dL (ref 0.0–0.2)
Total Bilirubin: 0.5 mg/dL (ref 0.3–1.2)
Total Protein: 5.6 g/dL — ABNORMAL LOW (ref 6.5–8.1)

## 2021-01-13 LAB — GLUCOSE, CAPILLARY
Glucose-Capillary: 172 mg/dL — ABNORMAL HIGH (ref 70–99)
Glucose-Capillary: 123 mg/dL — ABNORMAL HIGH (ref 70–99)
Glucose-Capillary: 149 mg/dL — ABNORMAL HIGH (ref 70–99)
Glucose-Capillary: 134 mg/dL — ABNORMAL HIGH (ref 70–99)
Glucose-Capillary: 124 mg/dL — ABNORMAL HIGH (ref 70–99)
Glucose-Capillary: 135 mg/dL — ABNORMAL HIGH (ref 70–99)

## 2021-01-13 MED ORDER — FUROSEMIDE 10 MG/ML IJ SOLN
40.0000 mg | Freq: Once | INTRAMUSCULAR | Status: AC
  Administered 2021-01-13: 40 mg via INTRAVENOUS
  Filled 2021-01-13: qty 4

## 2021-01-13 MED ORDER — MIDAZOLAM HCL 2 MG/2ML IJ SOLN
4.0000 mg | Freq: Once | INTRAMUSCULAR | Status: AC
  Administered 2021-01-13: 2 mg via INTRAVENOUS
  Filled 2021-01-13: qty 4

## 2021-01-13 MED ORDER — POTASSIUM CHLORIDE 20 MEQ PO PACK
40.0000 meq | PACK | Freq: Once | ORAL | Status: DC
  Filled 2021-01-13: qty 2

## 2021-01-13 MED ORDER — ALBUMIN HUMAN 25 % IV SOLN
25.0000 g | Freq: Once | INTRAVENOUS | Status: AC
  Administered 2021-01-13: 25 g via INTRAVENOUS
  Filled 2021-01-13: qty 100

## 2021-01-13 MED ORDER — POTASSIUM CHLORIDE 20 MEQ PO PACK
40.0000 meq | PACK | Freq: Once | ORAL | Status: AC
  Administered 2021-01-13: 40 meq

## 2021-01-13 MED ORDER — CLONIDINE HCL 0.2 MG PO TABS
0.2000 mg | ORAL_TABLET | Freq: Three times a day (TID) | ORAL | Status: DC
  Administered 2021-01-13 – 2021-01-15 (×7): 0.2 mg
  Filled 2021-01-13 (×7): qty 1

## 2021-01-13 MED ORDER — ETOMIDATE 2 MG/ML IV SOLN
20.0000 mg | Freq: Once | INTRAVENOUS | Status: AC
Start: 2021-01-13 — End: 2021-01-13
  Administered 2021-01-13: 20 mg via INTRAVENOUS
  Filled 2021-01-13: qty 10

## 2021-01-13 MED ORDER — CHLORHEXIDINE GLUCONATE 0.12 % MT SOLN
OROMUCOSAL | Status: AC
  Filled 2021-01-13: qty 15

## 2021-01-13 MED ORDER — BETHANECHOL CHLORIDE 10 MG PO TABS
10.0000 mg | ORAL_TABLET | Freq: Three times a day (TID) | ORAL | Status: DC
  Administered 2021-01-13 – 2021-01-15 (×8): 10 mg
  Filled 2021-01-13 (×8): qty 1

## 2021-01-13 MED ORDER — FENTANYL CITRATE PF 50 MCG/ML IJ SOSY
100.0000 ug | PREFILLED_SYRINGE | Freq: Once | INTRAMUSCULAR | Status: AC
  Administered 2021-01-13: 50 ug via INTRAVENOUS
  Filled 2021-01-13: qty 2

## 2021-01-13 MED ORDER — VECURONIUM BROMIDE 10 MG IV SOLR
10.0000 mg | Freq: Once | INTRAVENOUS | Status: AC
Start: 2021-01-13 — End: 2021-01-13
  Administered 2021-01-13: 10 mg via INTRAVENOUS
  Filled 2021-01-13: qty 10

## 2021-01-13 MED ORDER — BETHANECHOL CHLORIDE 10 MG PO TABS
10.0000 mg | ORAL_TABLET | Freq: Three times a day (TID) | ORAL | Status: DC
Start: 2021-01-13 — End: 2021-01-13
  Filled 2021-01-13: qty 1

## 2021-01-13 NOTE — Procedures (Signed)
Diagnostic Bronchoscopy  Stephen Delgado  [id number]  [birth year]  Date:01/13/21  Time:1:21 PM   Provider Performing:Verneda Hollopeter D. Harris   Procedure: Diagnostic Bronchoscopy (40973)  Indication(s) Assist with direct visualization of tracheostomy placement  Consent Risks of the procedure as well as the alternatives and risks of each were explained to the patient and/or caregiver.  Consent for the procedure was obtained.   Anesthesia See separate tracheostomy note   Time Out Verified patient identification, verified procedure, site/side was marked, verified correct patient position, special equipment/implants available, medications/allergies/relevant history reviewed, required imaging and test results available.   Sterile Technique Usual hand hygiene, masks, gowns, and gloves were used   Procedure Description Bronchoscope advanced through endotracheal tube and into airway.  After suctioning out tracheal secretions, bronchoscope used to provide direct visualization of tracheostomy placement.   Complications/Tolerance None; patient tolerated the procedure well.   EBL None  Specimen(s) None  [name] D. Tiburcio Pea, NP-C [facility] Personal contact information can be found on Amion  01/13/2021, 1:21 PM

## 2021-01-13 NOTE — Progress Notes (Signed)
Nutrition Follow-up  DOCUMENTATION CODES:   Not applicable  INTERVENTION:   Tube feeding via Cortrak tube:  Glucerna 1.2 @ 70 ml/hr (1680 ml/day) ProSource TF 45 ml daily   Tube feeding regimen provides 2056 kcal, 111 grams of protein, and 1362 ml of H2O.   NUTRITION DIAGNOSIS:   Inadequate oral intake related to lethargy/confusion, dysphagia as evidenced by NPO status. Ongoing.   GOAL:   Patient will meet greater than or equal to 90% of their needs Met with TF at goal.   MONITOR:   Diet advancement, Labs, Weight trends  REASON FOR ASSESSMENT:   Ventilator, Consult Enteral/tube feeding initiation and management  ASSESSMENT:   55 year old male who presented to the ED on 11/29 with AMS. PMH of HTN, T2DM, CKD stage III. 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  Pt discussed during ICU rounds and with RN.  Pt continues to have fevers. Plan for trach today. Pt with 2700 ml out after lasix but remains + 20 L since admission.    Patient is currently on ventilator support Temp (24hrs), Avg:100.7 F (38.2 C), Min:99.8 F (37.7 C), Max:101.4 F (38.6 C)   11/30 cortrak placed; tip gastric 12/2 trickle TF 12/3 pt intubated due to increased WOB, +emesis, TF held 12/4 trickle TF restarted 12/5 advancement orders placed 12/9 TF adjusted  Medications reviewed and include: SSI, novolog, levemir, protonix   Labs reviewed: K: 3.1, PO4: 5.8, Magnesium: 2.7, Ammonia: 63, Iron: 34 (12/3) CBG's: 123-198  UOP: 2700 ml  I&O: + 20 L  Diet Order:   Diet Order             Diet NPO time specified  Diet effective midnight                   EDUCATION NEEDS:   Not appropriate for education at this time  Skin:  Skin Assessment: Reviewed RN Assessment  Last BM:  500 ml via rectal tube  Height:   Ht Readings from Last 1 Encounters:  01/03/21 6' (1.829 m)    Weight:   Wt Readings from Last 1  Encounters:  01/13/21 85.3 kg   BMI:  Body mass index is 25.5 kg/m.  Estimated Nutritional Needs:   Kcal:  2000-2200  Protein:  100-115 grams  Fluid:  >/= 2.0 L  Thijs Brunton P., RD, LDN, CNSC See AMiON for contact information

## 2021-01-13 NOTE — Procedures (Signed)
Percutaneous Tracheostomy Procedure Note   ZAKHAI MEISINGER  [id number]  06/17/1965  Date:01/13/21  Time:1:20 PM   Provider Performing:Deannie Resetar  Procedure: Percutaneous Tracheostomy with Bronchoscopic Guidance (78938)  Indication(s) Acute respiratory failure  Consent Risks of the procedure as well as the alternatives and risks of each were explained to the patient and/or caregiver.  Consent for the procedure was obtained.  Anesthesia Etomidate, Versed, Fentanyl, Vecuronium   Time Out Verified patient identification, verified procedure, site/side was marked, verified correct patient position, special equipment/implants available, medications/allergies/relevant history reviewed, required imaging and test results available.   Sterile Technique Maximal sterile technique including sterile barrier drape, hand hygiene, sterile gown, sterile gloves, mask, hair covering.    Procedure Description Appropriate anatomy identified by palpation.  Patient's neck prepped and draped in sterile fashion.  1% lidocaine with epinephrine was used to anesthetize skin overlying neck.  1.5cm incision made and blunt dissection performed until tracheal rings could be easily palpated.   Then a size 8 Shiley tracheostomy was placed under bronchoscopic visualization using usual Seldinger technique and serial dilation.   Bronchoscope confirmed placement above the carina.  Tracheostomy was sutured in place with adhesive pad to protect skin under pressure.    Patient connected to ventilator.   Complications/Tolerance None; patient tolerated the procedure well. Chest X-ray is ordered to confirm no post-procedural complication.   EBL Minimal   Specimen(s) None

## 2021-01-13 NOTE — Progress Notes (Signed)
NAME:  Stephen Delgado, MRN:  [id number], DOB:  1965-08-25, LOS: 14 ADMISSION DATE:  12/30/2020, CONSULTATION DATE:  01/01/21 REFERRING MD:  xu (stroke), CHIEF COMPLAINT:  respiratory failure   History of Present Illness:  55 yo male presented with headache, nausea and altered mental status.  CT head showed acute Lt ICH with IVH likely related to hypertensive emergency (BP 195/116).  Had persistent hypertension and concern for airway protection, and PCCM asked to assist with ICU management.  Pertinent  Medical History  HTN, DM type 2, CKD 3a, OSA  Significant Hospital Events:  11/29 Admit, start cleviprex 12/01 PCCM consulted; changed from cleviprex to cardene due to elevated triglycerides 12/03 intubated; episode of vomiting >> tube feeds held 12/04 resume trickle tube feeds 12/06 remains minimally responsive. Tolerating SBT.  12/12 no acute events overnight, T-max 102 point 12/13 bedside trach planned midmorning   Studies:  CT head 11/29 >> large amount of IVH involving lateral/3rd/4th ventricles, ICH in Lt BG, chronic microvascular ischemic changes Echo 11/30 >> EF 50 to 55%, mod LVH, grade 1 DD, ascending aorta 37 mm EEG 12/01 >> continuous generalized slowing MRI brain 12/03 >> numerous small acute infarcts in b/l cerebral hemispheres, Lt caudate hemorrhage, IVH, scattered sulcal SAH, mild communicating hydrocephalus 12/5 Started on free water for hypernatremia. Cefazolin for MSSA pneumonia.  Interim History / Subjective:  No acute events overnight  Good urine output with 2.7 L diuresed in the last 24 hours the patient remains 21 L positive  Objective   Blood pressure (!) 115/57, pulse 90, temperature (!) 101.2 F (38.4 C), temperature source Axillary, resp. rate 17, height 6' (1.829 m), weight 85.3 kg, SpO2 96 %.    Vent Mode: PRVC FiO2 (%):  [30 %] 30 % Set Rate:  [16 bmp] 16 bmp Vt Set:  [195 mL] 620 mL PEEP:  [5 cmH20] 5 cmH20 Plateau Pressure:  [8 cmH20-23 cmH20] 19  cmH20   Intake/Output Summary (Last 24 hours) at 01/13/2021 0932 Last data filed at 01/13/2021 0400 Gross per 24 hour  Intake 3951.11 ml  Output 3200 ml  Net 751.11 ml    Filed Weights   12/31/20 1435 01/07/21 0500 01/13/21 0500  Weight: 85.2 kg 85.2 kg 85.3 kg    Examination: General: Acute ill-appearing middle-aged gentleman lying in bed on mechanical ventilation no acute distress HEENT: ETT, MM pink/moist, PERRL,  Neuro: Unresponsive to painful or verbal stimuli, no longer has forced downward gaze CV: s1s2 regular rate and rhythm, no murmur, rubs, or gallops,  PULM: Auscultation bilaterally, no increased work of breathing, tolerating GI: soft, bowel sounds active in all 4 quadrants, non-tender, non-distended, tolerating TF Extremities: warm/dry, 2+ pitting edema  Skin: no rashes or lesions  Assessment & Plan:  Hypertensive emergency -Blood pressure 238/160 on admission P: BP well controlled on oral regiment, continue  SBP goal less than 160 Continuous telemetry   Acute ICH and IVH -Left basal ganglia ICH and IVH likely secondary to hypertensive emergency Bilateral lacunar infarcts -MRI 12/3 numerous small acute infarcts in bilateral cerebral hemispheres P: Management per neurology  Maintain neuro protective measures; goal for eurothermia, euglycemia, eunatermia, normoxia, and PCO2 goal of 35-40 Nutrition and bowel regiment  Seizure precautions  AEDs per neurology  Aspirations precautions  BP control as above   Acute respiratory failure secondary to left basal ganglia hemorrhage  MSSA PNA P: Bedside trach planned today  Continue ventilator support with lung protective strategies  Wean PEEP and FiO2 for sats greater than  90%. Head of bed elevated 30 degrees. Plateau pressures less than 30 cm H20.  Follow intermittent chest x-ray and ABG.   SAT/SBT as tolerated, mentation preclude extubation  Ensure adequate pulmonary hygiene  Follow cultures  VAP bundle in  place  PAD protocol  DM2 with hyperglycemia -CBG continues to remain P: Continue SSI  Increase TF coverage  Continue long acting insulin  CBG goal 140-160  Worsening AKI Hypernitremia -Creatinine 1.49 02/06/2020, creatinine currently 2.98 with GFR 24.  Adequate urine output with 1.3 L on the 20 last 24 hours Significant volume overload approximate +19 L thus far during admission P: Follow renal function  Monitor urine output Trend Bmet Avoid nephrotoxins Ensure adequate renal perfusion   Intermittent fevers suspect central -T-max 102.1 overnight P: Supportive care  Continue to trend fever curve and WBC  Acute liver injury -LFTs slightly improved P: Avoid hepatotoxins  Supportive care  Relative protein calorie malnutrition P: Continue TF  Supplement protein   Goals of care Family has elected to continue aggressive care including placement of tracheostomy and PEG tube.  Appreciate palliative care's assistance.    Best Practice (right click and "Reselect all SmartList Selections" daily)   Diet/type: tubefeeds  DVT prophylaxis: prophylactic heparin  GI prophylaxis: PPI Lines: N/A Foley:  Condom catheter Code Status:  full code Family: per primary and palliative  CRITICAL CARE Performed by: Kamila Broda D. Harris   Total critical care time: 38 minutes  Critical care time was exclusive of separately billable procedures and treating other patients.  Critical care was necessary to treat or prevent imminent or life-threatening deterioration.  Critical care was time spent personally by me on the following activities: development of treatment plan with patient and/or surrogate as well as nursing, discussions with consultants, evaluation of patient's response to treatment, examination of patient, obtaining history from patient or surrogate, ordering and performing treatments and interventions, ordering and review of laboratory studies, ordering and review of radiographic  studies, pulse oximetry and re-evaluation of patient's condition.  Ziggy Chanthavong D. Kenton Kingfisher, NP-C [facility] Personal contact information can be found on Amion  01/13/2021, 7:09 AM

## 2021-01-13 NOTE — Progress Notes (Addendum)
STROKE TEAM PROGRESS NOTE   INTERVAL HISTORY Patient seen in room with no family currently at the bedside. Plan for a trach and PEG today, family meeting cancelled. Neuro exam remains grossly unchanged.   Vitals:   01/13/21 0800 01/13/21 0900 01/13/21 1200 01/13/21 1222  BP: (!) 113/59 (!) 104/57 133/62 (!) 120/58  Pulse: 85 83 75 84  Resp: 16 17 16 16   Temp: (!) 100.8 F (38.2 C)  99.8 F (37.7 C)   TempSrc: Axillary  Axillary   SpO2: 99% 95% 100% 100%  Weight:      Height:       CBC:  Recent Labs  Lab 01/12/21 1301 01/13/21 0712  WBC 18.1* 14.9*  NEUTROABS 16.1* 13.0*  HGB 11.2* 10.1*  HCT 34.5* 30.3*  MCV 73.9* 72.8*  PLT 229 0000000    Basic Metabolic Panel:  Recent Labs  Lab 01/11/21 0323 01/12/21 1301 01/13/21 0712  NA 144 142 143  K 4.5 3.6 3.1*  CL 113* 110 111  CO2 17* 22 21*  GLUCOSE 178* 192* 126*  BUN 67* 74* 79*  CREATININE 2.98* 2.45* 2.66*  CALCIUM 8.1* 7.9* 7.9*  MG 2.7*  --   --   PHOS 5.8*  --   --     Lipid Panel:  Recent Labs  Lab 01/12/21 1301  TRIG 139     IMAGING past 24 hours No results found.  PHYSICAL EXAM General:  Patient is a well-developed, well-nourished middle-aged African-American male who is intubated.  Respiratory: Patient is intubated, on ventilator and tachypneic  Neurological:Patient will not respond to voice or open eyes to sternal rub.  Corneal reflexes present bilaterally, cough reflex present. Right gaze deviation noted when attempting to elicit doll's eyes reflex, patient not able to overcome.  Minimal doll's eyes reflex present.  Patient has minimal withdrawal to pain to noxious stimuli in bilateral upper extremities. No movement in bilateral lower extremities to noxious stimuli.  Negative Babinski reflex  ASSESSMENT/PLAN Mr. CEDDRICK KRONICK is a 55 y.o. male with history of HTN (questionable compliance with medications), DM and CKD 3 presenting with headache and altered mental status. He was taken to the ED  and found to have a left basal ganglia ICH with IVH.  He was hypertensive on arrival to 238/160 and was given Cleviprex for BP control.  He was also agitated and required restraints.  Repeat head CT 11/30 shows unchanged IPH with IVH  Head CTs done on 12/1 and 12/3 also demonstrates unchanged IPH with IVH.  MRI/MRA completed on 12/4.patient's neurological exam continues to remain the same.  Goals of care conversation happened 12/11.  Plan for trach and PEG today, 12/13.   ICH score 2  ICH with IVH - left basal ganglia ICH with IVH likely secondary to hypertensive emergency  bilateral lacunar infarcts - likely small vessel disease vs. Strict BP control in the setting of ICH CT head Large basal ganglia IPH with IVH involving the lateral, third and fourth ventricles with no significant mass effect or midline shift.  Repeat CT 11/30 0116 Unchanged IPH with IVH Repeat CT head 11/30 1629  Stable IPH with IVH, stable mild hydrocephalus Repeat CT Head 12/6 - Unchanged IVH and left caudate head intraparenchymal component. No new site of hemorrhage. 2D Echo EF 99991111, grade 1 diastolic dysfunction, no atrial level shunt MRI-10/3-Numerous small acute infarcts in bilateral cerebral hemispheres. Minimal involvement of the right superior cerebellum.  Left caudate hemorrhage, intraventricular hemorrhage, and scattered sulcal subarachnoid hemorrhage as  seen on recent CT imaging. Associated mild communicating hydrocephalus. Numerous chronic microhemorrhages probably secondary to hypertension. Chronic microvascular ischemic changes MRA head and neck 10/4- Mildly tortuous cervical carotid and vertebral arteries without stenosis. Intracranial hemorrhage and ventricles appears stable from the MRI yesterday. CUS unremarkable LDL 111.4 HgbA1c 6.3 VTE prophylaxis - SCDs aspirin 81 mg daily prior to admission, now on No antithrombotic secondary to IPH Therapy recommendations:  SNF Disposition:  pending  Respiratory  distress Intubated and ventilated CCM on board Tachypnea- PRN fentanyl and Versed ETT exchanged 12/4 due to a cuff leak Precedex for sedation currently running. Prop is on hold  Hypertensive emergency: improving. Home meds:  Amlodipine 10 mg daily, metoprolol succinate 25 mg daily,  Unstable No longer requiring Cardene to maintain BP at goal.  SBP goal <160 Currently On norvasc 10mg , coreg 25mg , Lisinopril 40mg , Clonidine 0.3 mg q8h and minoxidil 10mg  daily Long-term BP goal normotensive  Hyperlipidemia Home meds:  none LDL 111.4, goal < 70 Hold off statin for now due to acute ICH Consider statin at discharge         AKI on CKD Cre 2.98->2.45 today On TF Close monitoring I/O CCM on board        Other Stroke Risk Factors Questionable compliance with medications at home        Other Active Problems Code status - full code Hypokalemia 3.1 -> 3.4 -> 3.5-> 4.5->3.6-> 3.1 Hypernatremia- 153->152->149->148-> 144->142->143  - free water 200 ml Q 2 hours Ileus seen on KUB- fleet enema given 12/1; bowel regimen in place Aspiration pneumonia -> Ancef started 12/5 per CCM - temp 97.5- WBCs 20.5->12.2->15.9-> 15.6->17.6->18.1->14.9 Anemia - Hb 13.0->10.8->10.6-> 11.2->11.3->11.2->10.2  Hospital day # 14  Patient seen and examined by NP/APP with MD. MD to update note as needed.   , DNP, FNP-BC [facility] Pager: [phone]  ATTENDING NOTE: I reviewed above note and agree with the assessment and plan. Pt was seen and examined.   Patient still intubated on ventilation, no family at bedside.  RN and Dr. 14/1 are at bedside, plan for tracheostomy 11:30 AM.  Patient neuro stable, unchanged, still has left upper extremity withdraw to pain but no significant movement of other extremities.  Intermittent fever, T-max 101.2.  Creatinine 2.66, stable, leukocytosis WBC 14.9, improved.  BP on the lower end, DC amlodipine and decrease clonidine dose.  Continue other  management.  For detailed assessment and plan, please refer to above as I have made changes wherever appropriate.   14/5, MD PhD Stroke Neurology 01/13/2021 5:34 PM  This patient is critically ill due to ICH, IVH, multifocal infarcts, hypertensive emergency, pneumonia, fever and at significant risk of neurological worsening, death form cerebral edema, brain herniation, seizure, sepsis, obstructive hydrocephalus. This patient's care requires constant monitoring of vital signs, hemodynamics, respiratory and cardiac monitoring, review of multiple databases, neurological assessment, discussion with family, other specialists and medical decision making of high complexity. I spent 30 minutes of neurocritical care time in the care of this patient.  I discussed with Dr. [phone], CCM    To contact Stroke Continuity provider, please refer to Merrily Pew. After hours, contact General Neurology

## 2021-01-13 NOTE — Progress Notes (Signed)
Progress note:  After reviewing the patient's chart, epic notes, labs, and imaging, I assessed the patient at bedside.  He continues to have a poor neuro exam.  Stephen Delgado is in place with copious clear secretions.  Patient is in no respiratory distress at this time and still on ventilator support.  No family is at bedside.  Goals are clear.  Full code and full scope with trach and PEG placement.  Palliative medicine team will continue to monitor the patient throughout his hospitalization.  PMT will shadow the patient's chart and intervene should the family reach out or primary have PMT needs.  I personally spoke with the patient's son yesterday and made him aware of our contact information should he want to speak with the palliative medicine team.  Stephen Delgado. Manon Hilding, FNP-BC Palliative Medicine Team Team Phone # [phone]  NO CHARGE

## 2021-01-14 ENCOUNTER — Inpatient Hospital Stay (HOSPITAL_COMMUNITY): Payer: 59

## 2021-01-14 DIAGNOSIS — Z4659 Encounter for fitting and adjustment of other gastrointestinal appliance and device: Secondary | ICD-10-CM | POA: Diagnosis not present

## 2021-01-14 DIAGNOSIS — J9601 Acute respiratory failure with hypoxia: Secondary | ICD-10-CM | POA: Diagnosis not present

## 2021-01-14 DIAGNOSIS — J15211 Pneumonia due to Methicillin susceptible Staphylococcus aureus: Secondary | ICD-10-CM | POA: Diagnosis not present

## 2021-01-14 DIAGNOSIS — I610 Nontraumatic intracerebral hemorrhage in hemisphere, subcortical: Secondary | ICD-10-CM | POA: Diagnosis not present

## 2021-01-14 DIAGNOSIS — I161 Hypertensive emergency: Secondary | ICD-10-CM | POA: Diagnosis not present

## 2021-01-14 DIAGNOSIS — I619 Nontraumatic intracerebral hemorrhage, unspecified: Secondary | ICD-10-CM | POA: Diagnosis not present

## 2021-01-14 LAB — CBC WITH DIFFERENTIAL/PLATELET
Abs Immature Granulocytes: 0.12 10*3/uL — ABNORMAL HIGH (ref 0.00–0.07)
Basophils Absolute: 0 10*3/uL (ref 0.0–0.1)
Basophils Relative: 0 %
Eosinophils Absolute: 0.2 10*3/uL (ref 0.0–0.5)
Eosinophils Relative: 1 %
HCT: 29.5 % — ABNORMAL LOW (ref 39.0–52.0)
Hemoglobin: 9.5 g/dL — ABNORMAL LOW (ref 13.0–17.0)
Immature Granulocytes: 1 %
Lymphocytes Relative: 7 %
Lymphs Abs: 0.8 10*3/uL (ref 0.7–4.0)
MCH: 24.1 pg — ABNORMAL LOW (ref 26.0–34.0)
MCHC: 32.2 g/dL (ref 30.0–36.0)
MCV: 74.9 fL — ABNORMAL LOW (ref 80.0–100.0)
Monocytes Absolute: 0.7 10*3/uL (ref 0.1–1.0)
Monocytes Relative: 6 %
Neutro Abs: 10.1 10*3/uL — ABNORMAL HIGH (ref 1.7–7.7)
Neutrophils Relative %: 85 %
Platelets: 274 10*3/uL (ref 150–400)
RBC: 3.94 MIL/uL — ABNORMAL LOW (ref 4.22–5.81)
RDW: 15.2 % (ref 11.5–15.5)
WBC: 11.9 10*3/uL — ABNORMAL HIGH (ref 4.0–10.5)
nRBC: 0 % (ref 0.0–0.2)

## 2021-01-14 LAB — HEPATIC FUNCTION PANEL
ALT: 133 U/L — ABNORMAL HIGH (ref 0–44)
AST: 163 U/L — ABNORMAL HIGH (ref 15–41)
Albumin: 1.8 g/dL — ABNORMAL LOW (ref 3.5–5.0)
Alkaline Phosphatase: 105 U/L (ref 38–126)
Bilirubin, Direct: 0.2 mg/dL (ref 0.0–0.2)
Indirect Bilirubin: 0.4 mg/dL (ref 0.3–0.9)
Total Bilirubin: 0.6 mg/dL (ref 0.3–1.2)
Total Protein: 5.6 g/dL — ABNORMAL LOW (ref 6.5–8.1)

## 2021-01-14 LAB — URINALYSIS, ROUTINE W REFLEX MICROSCOPIC
Bilirubin Urine: NEGATIVE
Glucose, UA: NEGATIVE mg/dL
Ketones, ur: NEGATIVE mg/dL
Leukocytes,Ua: NEGATIVE
Nitrite: NEGATIVE
Protein, ur: 30 mg/dL — AB
Specific Gravity, Urine: 1.02 (ref 1.005–1.030)
pH: 5.5 (ref 5.0–8.0)

## 2021-01-14 LAB — BASIC METABOLIC PANEL
Anion gap: 8 (ref 5–15)
BUN: 82 mg/dL — ABNORMAL HIGH (ref 6–20)
CO2: 22 mmol/L (ref 22–32)
Calcium: 8 mg/dL — ABNORMAL LOW (ref 8.9–10.3)
Chloride: 115 mmol/L — ABNORMAL HIGH (ref 98–111)
Creatinine, Ser: 2.65 mg/dL — ABNORMAL HIGH (ref 0.61–1.24)
GFR, Estimated: 27 mL/min — ABNORMAL LOW (ref >60)
Glucose, Bld: 174 mg/dL — ABNORMAL HIGH (ref 70–99)
Potassium: 3.6 mmol/L (ref 3.5–5.1)
Sodium: 145 mmol/L (ref 135–145)

## 2021-01-14 LAB — GLUCOSE, CAPILLARY
Glucose-Capillary: 163 mg/dL — ABNORMAL HIGH (ref 70–99)
Glucose-Capillary: 149 mg/dL — ABNORMAL HIGH (ref 70–99)
Glucose-Capillary: 174 mg/dL — ABNORMAL HIGH (ref 70–99)
Glucose-Capillary: 172 mg/dL — ABNORMAL HIGH (ref 70–99)
Glucose-Capillary: 153 mg/dL — ABNORMAL HIGH (ref 70–99)
Glucose-Capillary: 118 mg/dL — ABNORMAL HIGH (ref 70–99)

## 2021-01-14 LAB — URINALYSIS, MICROSCOPIC (REFLEX)

## 2021-01-14 MED ORDER — ALBUMIN HUMAN 25 % IV SOLN
25.0000 g | Freq: Once | INTRAVENOUS | Status: AC
  Administered 2021-01-14: 11:00:00 25 g via INTRAVENOUS
  Filled 2021-01-14: qty 100

## 2021-01-14 MED ORDER — FUROSEMIDE 10 MG/ML IJ SOLN
40.0000 mg | Freq: Once | INTRAMUSCULAR | Status: AC
  Administered 2021-01-14: 11:00:00 40 mg via INTRAVENOUS
  Filled 2021-01-14: qty 4

## 2021-01-14 NOTE — TOC Initial Note (Signed)
Transition of Care [facility]) - Initial/Assessment Note    Patient Details  Name: Stephen Delgado MRN: [id number] Date of Birth: Jun 26, 1965  Transition of Care [facility]) CM/SW Contact:    Glennon Mac, RN Phone Number: 01/14/2021, 4:28 PM  Clinical Narrative:                 55 yo male presented with headache, nausea and altered mental status.  CT head showed acute Lt ICH with IVH likely related to hypertensive emergency. Pt intubated on 12/3 and tracheostomy performed on 01/13/21.  LTAC consult received from MD; referral made to area LTAC hospitals to check eligibility/insurance contracts.  Will follow.  Expected Discharge Plan: Long Term Acute Care (LTAC) Barriers to Discharge: Continued Medical Work up          Expected Discharge Plan and Services Expected Discharge Plan: Long Term Acute Care (LTAC)   Discharge Planning Services: CM Consult   Living arrangements for the past 2 months: Single Family Home                                      Prior Living Arrangements/Services Living arrangements for the past 2 months: Single Family Home Lives with:: Spouse                   Activities of Daily Living Home Assistive Devices/Equipment: None ADL Screening (condition at time of admission) Patient's cognitive ability adequate to safely complete daily activities?: Yes Is the patient deaf or have difficulty hearing?: No Does the patient have difficulty seeing, even when wearing glasses/contacts?: No Does the patient have difficulty concentrating, remembering, or making decisions?: No Patient able to express need for assistance with ADLs?: No Does the patient have difficulty dressing or bathing?: No Independently performs ADLs?: Yes (appropriate for developmental age) Does the patient have difficulty walking or climbing stairs?: No Weakness of Legs: None Weakness of Arms/Hands: None                   Emotional Assessment   Attitude/Demeanor/Rapport: Unable  to Assess Affect (typically observed): Unable to Assess        Admission diagnosis:  ICH (intracerebral hemorrhage) (HCC) [I61.9] Nontraumatic intracerebral hemorrhage, unspecified cerebral location, unspecified laterality [facility]) [I61.9] Patient Active Problem List   Diagnosis Date Noted   Acute respiratory failure (HCC)    ICH (intracerebral hemorrhage) (HCC) 12/30/2020   Hyperlipidemia associated with type 2 diabetes mellitus (HCC) 02/28/2016   Healthcare maintenance 02/08/2016   Sinus congestion 04/28/2015   Diabetes mellitus (HCC) 04/28/2015   Accelerated hypertension 04/19/2015   Abnormal EKG 04/19/2015   Chronic kidney disease (CKD) 04/19/2015   Microcytosis 04/19/2015   PCP:  Patient, [name] Pharmacy:   [facility] [address] [place] [address] Phone: [phone] Fax: [phone]  [facility] [address] [place] [address] Phone: [phone] Fax: [phone]     Social Determinants of Health (SDOH) Interventions    Readmission Risk Interventions No flowsheet data found.  Quintella Baton, RN, BSN  Trauma/Neuro ICU Case Manager [phone]

## 2021-01-14 NOTE — Progress Notes (Signed)
Pt placed on PS/CPAP 5/5 @ 30% and is tolerating well. RT will continue to monitor.

## 2021-01-14 NOTE — Evaluation (Addendum)
Physical Therapy Evaluation Patient Details Name: Stephen Delgado MRN: [id number] DOB: Jul 09, 1965 Today's Date: 01/14/2021  History of Present Illness  55 y/o presented to [facility] ED on 11/29 for AMS, lethargy, and vomiting. Transferred to [facility]. CTH showed L caudate/BG hemorrhage with IVH. Declining respiratory status with snoring respirations. Intubated 12/3. Trach placed 12/13. PMH: HTN, sleep apnea, DM, CKD 3  Clinical Impression  Patient seen with RN present to assist. Patient with decreased level of arousal and not responding to noxious stimuli other than L UE. Patient placed in chair position to increase arousal but no success. Patient with eyes closed throughout session with no attempts to open. Blinking noted at times and blinked to threat on L eye. Patient will benefit from skilled PT services during acute stay to address listed deficits. Recommend PT at [facility] for continued therapy to maximize functional potential and quality of life.        Recommendations for follow up therapy are one component of a multi-disciplinary discharge planning process, led by the attending physician.  Recommendations may be updated based on patient status, additional functional criteria and insurance authorization.  Follow Up Recommendations PT at Long-term acute care hospital    Assistance Recommended at Discharge Frequent or constant Supervision/Assistance  Functional Status Assessment Patient has had a recent decline in their functional status and/or demonstrates limited ability to make significant improvements in function in a reasonable and predictable amount of time  Equipment Recommendations  Other (comment) (TBD)    Recommendations for Other Services OT consult     Precautions / Restrictions Precautions Precautions: Fall Precaution Comments: cortrak, trach collar, flexiseal, foley Restrictions Weight Bearing Restrictions: No      Mobility  Bed Mobility               General bed mobility  comments: placed in chair position with RN present to assist. Patient with no increase in arousal. Brought patient forward away from mattress with no change.    Transfers                        Ambulation/Gait                  Stairs            Wheelchair Mobility    Modified Rankin (Stroke Patients Only) Modified Rankin (Stroke Patients Only) Pre-Morbid Rankin Score: No significant disability Modified Rankin: Severe disability     Balance                                             Pertinent Vitals/Pain Pain Assessment: CPOT Facial Expression: Relaxed, neutral Body Movements: Absence of movements Muscle Tension: Relaxed Compliance with ventilator (intubated pts.): Tolerating ventilator or movement Vocalization (extubated pts.): N/A CPOT Total: 0    Home Living Family/patient expects to be discharged to:: Unsure                   Additional Comments: unable to obtain information due to lethargy/decreased level of arousal (per chart review patient from home)    Prior Function Prior Level of Function : Patient poor historian/Family not available                     Hand Dominance        Extremity/Trunk Assessment   Upper Extremity Assessment Upper  Extremity Assessment: Difficult to assess due to impaired cognition;Generalized weakness    Lower Extremity Assessment Lower Extremity Assessment: Difficult to assess due to impaired cognition;Generalized weakness       Communication   Communication: Tracheostomy  Cognition Arousal/Alertness: Lethargic Behavior During Therapy: Flat affect Overall Cognitive Status: Difficult to assess                                 General Comments: eyes closed entire session. Unable to arouse with sternal rub and other noxious stimuli. L arm movement with painful stimuli to L and R trap        General Comments General comments (skin integrity, edema,  etc.): on trach collar, 5L O2 28% FiO2 with VSS    Exercises     Assessment/Plan    PT Assessment Patient needs continued PT services  PT Problem List Decreased strength;Decreased activity tolerance;Decreased balance;Decreased coordination;Decreased mobility;Decreased cognition;Decreased safety awareness;Decreased knowledge of precautions;Cardiopulmonary status limiting activity       PT Treatment Interventions DME instruction;Functional mobility training;Therapeutic activities;Therapeutic exercise;Balance training;Patient/family education;Wheelchair mobility training    PT Goals (Current goals can be found in the Care Plan section)  Acute Rehab PT Goals Patient Stated Goal: unable PT Goal Formulation: Patient unable to participate in goal setting Time For Goal Achievement: 01/28/21 Potential to Achieve Goals: Poor    Frequency Min 1X/week (until level of arousal improves)   Barriers to discharge        Co-evaluation               AM-PAC PT "6 Clicks" Mobility  Outcome Measure Help needed turning from your back to your side while in a flat bed without using bedrails?: Total Help needed moving from lying on your back to sitting on the side of a flat bed without using bedrails?: Total Help needed moving to and from a bed to a chair (including a wheelchair)?: Total Help needed standing up from a chair using your arms (e.g., wheelchair or bedside chair)?: Total Help needed to walk in hospital room?: Total Help needed climbing 3-5 steps with a railing? : Total 6 Click Score: 6    End of Session Equipment Utilized During Treatment: Oxygen Activity Tolerance: Patient limited by lethargy Patient left: in bed;with call bell/phone within reach;with nursing/sitter in room Nurse Communication: Mobility status PT Visit Diagnosis: Muscle weakness (generalized) (M62.81);Other symptoms and signs involving the nervous system [id number])    Time: [id number] PT Time Calculation (min)  (ACUTE ONLY): 12 min   Charges:   PT Evaluation $PT Eval Moderate Complexity: 1 Mod          Salvatrice Morandi A. Gilford Rile PT, DPT Acute Rehabilitation Services Pager [phone] Office [phone]   Linna Hoff 01/14/2021, 5:21 PM

## 2021-01-14 NOTE — Progress Notes (Addendum)
NAME:  Stephen Delgado, MRN:  [id number], DOB:  October 02, 1965, LOS: 68 ADMISSION DATE:  12/30/2020, CONSULTATION DATE:  01/01/21 REFERRING MD:  xu (stroke), CHIEF COMPLAINT:  respiratory failure   History of Present Illness:  55 yo male presented with headache, nausea and altered mental status.  CT head showed acute Lt ICH with IVH likely related to hypertensive emergency (BP 195/116).  Had persistent hypertension and concern for airway protection, and PCCM asked to assist with ICU management.  Pertinent  Medical History  HTN, DM type 2, CKD 3a, OSA  Significant Hospital Events:  11/29 Admit, start cleviprex 12/01 PCCM consulted; changed from cleviprex to cardene due to elevated triglycerides 12/03 intubated; episode of vomiting >> tube feeds held 12/04 resume trickle tube feeds 12/5 Started on free water for hypernatremia. Cefazolin for MSSA pneumonia. 12/06 remains minimally responsive. Tolerating SBT.  12/12 no acute events overnight, T-max 102 point 12/13 Trached  12/14 Tolerating SBT trial this am.  Studies:  CT head 11/29 >> large amount of IVH involving lateral/3rd/4th ventricles, ICH in Lt BG, chronic microvascular ischemic changes Echo 11/30 >> EF 50 to 55%, mod LVH, grade 1 DD, ascending aorta 37 mm EEG 12/01 >> continuous generalized slowing MRI brain 12/03 >> numerous small acute infarcts in b/l cerebral hemispheres, Lt caudate hemorrhage, IVH, scattered sulcal SAH, mild communicating hydrocephalus  Interim History / Subjective:  Diuresed well with albumin and lasix, voided 3.1L in the last 24hrs  Objective   Blood pressure (!) 119/59, pulse 80, temperature (!) 101 F (38.3 C), temperature source Axillary, resp. rate 18, height 6' (1.829 m), weight 90.6 kg, SpO2 100 %.    Vent Mode: PRVC FiO2 (%):  [30 %-40 %] 30 % Set Rate:  [16 bmp] 16 bmp Vt Set:  [620 mL] 620 mL PEEP:  [5 cmH20] 5 cmH20 Plateau Pressure:  [14 cmH20-16 cmH20] 16 cmH20   Intake/Output Summary  (Last 24 hours) at 01/14/2021 [id number] Last data filed at 01/14/2021 [id number] Gross per 24 hour  Intake 1050 ml  Output 3195 ml  Net -2145 ml    Filed Weights   01/07/21 0500 01/13/21 0500 01/14/21 0500  Weight: 85.2 kg 85.3 kg 90.6 kg    Examination: General: Acute on chronically ill appearing middle aged male lying in bed on mechanical ventilation, in NAD HEENT: #8 shiley trach midline, MM pink/moist, PERRL,  Neuro: Unresponsive to painful or verbal stimuli  CV: s1s2 regular rate and rhythm, no murmur, rubs, or gallops,  PULM:  Clear to ascultation bilaterally, tolerating sbt currently  GI: soft, bowel sounds active in all 4 quadrants, non-tender, non-distended, tolerating TF Extremities: warm/dry, 2+ generalized edema  Skin: no rashes or lesions   Resolved problems:  Hypertensive emergency -Blood pressure 238/160 on admission  Assessment & Plan:   Acute ICH and IVH -Left basal ganglia ICH and IVH likely secondary to hypertensive emergency Bilateral lacunar infarcts -MRI 12/3 numerous small acute infarcts in bilateral cerebral hemispheres P: Management per neurology  Maintain neuro protective measures Nutrition and bowel regiment  Seizure precautions  Aspirations precautions   Acute respiratory failure secondary to left basal ganglia hemorrhage  MSSA PNA P: Tolerating SBT trial, goal for ATC trial today Continue ventilator support with lung protective strategies  Wean PEEP and FiO2 for sats greater than 90%. Head of bed elevated 30 degrees. Plateau pressures less than 30 cm H20.  Follow intermittent chest x-ray and ABG.   SAT/SBT as tolerated, mentation preclude extubation  Ensure adequate pulmonary hygiene  Follow cultures  VAP bundle in place  PAD protocol  DM2 with hyperglycemia -CBG continues to remain P: Continue SSI, TF coverage, and long acting insulin  CBG checks q4hrs  CBG goal 140-180  Worsening AKI Hypernitremia -Creatinine 1.49 02/06/2020,  creatinine currently 2.65 with GFR 27.  Adequate urine output  Significant volume overload  P: Follow renal function  Monitor urine output Trend Bmet Avoid nephrotoxins Ensure adequate renal perfusion  Enteral hydration   Hypertension  P: Remains controlled on oral regiment  SBP goal less than 160 Continuous telemetry   Intermittent fevers suspect central -WBC continues to downtrend  P: Continue supportive care  Continue to trent fever curve and WBC  Acute liver injury -LFTs slightly improved P: LFTs again bumped this am  Supportive care   Relative protein calorie malnutrition P: Continue TF Supplement protein   Goals of care Family has elected to continue aggressive care including placement of tracheostomy and PEG tube.  Appreciate palliative care's assistance.    Best Practice (right click and "Reselect all SmartList Selections" daily)   Diet/type: tubefeeds  DVT prophylaxis: prophylactic heparin  GI prophylaxis: PPI Lines: N/A Foley:  Condom catheter Code Status:  full code Family: per primary and palliative  CRITICAL CARE Performed by: Whitney D. Harris  Total critical care time: 35 minutes  Critical care time was exclusive of separately billable procedures and treating other patients.  Critical care was necessary to treat or prevent imminent or life-threatening deterioration.  Critical care was time spent personally by me on the following activities: development of treatment plan with patient and/or surrogate as well as nursing, discussions with consultants, evaluation of patient's response to treatment, examination of patient, obtaining history from patient or surrogate, ordering and performing treatments and interventions, ordering and review of laboratory studies, ordering and review of radiographic studies, pulse oximetry and re-evaluation of patient's condition.  Whitney D. Tiburcio Pea, NP-C [facility] Personal contact  information can be found on Amion  01/14/2021, 7:23 AM   Critical care attending attestation note:  Patient seen and examined and relevant ancillary tests reviewed.  I agree with the assessment and plan of care as outlined by Talitha Givens, NP . The following reflects my independent critical care time.   Synopsis of assessment and plan: 55 year old male with left basal ganglia intraparenchymal hemorrhage with IVH, course was complicated with encephalopathy and respiratory failure requiring tracheostomy  Patient continued to spike fever though white count is trending down He diuresed very well, remained net -2 L after albumin and Lasix   Physical exam: General: Critically ill-appearing male, s/p trach HEENT: [place]/AT, eyes anicteric.  Moist mucous membranes Neuro: Eyes closed, does not open, not following commands, withdrawing in right upper extremity, extensor posturing in left upper extremity, no movement to stimuli in bilateral lower extremities Chest: Coarse breath sounds, no wheezes or rhonchi Heart: Regular rate and rhythm, no murmurs or gallops Abdomen: Soft, nontender, nondistended, bowel sounds present Skin: No rash  Labs and images were reviewed  Assessment and plan: Acute left basal ganglia intraparenchymal with intraventricular hemorrhage in the setting of hypertensive emergency Acute bilateral lacunar infarcts Persistent encephalopathy due to intraparenchymal hemorrhage Acute respiratory failure with hypoxia MSSA pneumonia Poorly controlled diabetes type 2 with hyperglycemia Acute kidney injury on CKD stage IIIb Hypernatremia Hypertension Acute liver injury  Continue Lovenox Stroke team is following Maintain blood pressure with SBP goal less than 160 Patient remained encephalopathic despite being off sedation Continue lung protective ventilation Patient is  tolerating spontaneous breathing trial Will try trach collar later today Patient completed treatment for  MSSA pneumonia with cefazolin Continue insulin with CBG goal 140-180 Serum creatinine remained stable Will try albumin and Lasix again Monitor serum sodium LFTs slightly elevated, closely monitor  CRITICAL CARE Performed by: Jacky Kindle   Total independent critical care time: 32 minutes  Critical care time was exclusive of separately billable procedures and treating other patients.  Critical care was necessary to treat or prevent imminent or life-threatening deterioration.   Critical care was time spent personally by me on the following activities: development of treatment plan with patient and/or surrogate as well as nursing, discussions with consultants, evaluation of patient's response to treatment, examination of patient, obtaining history from patient or surrogate, ordering and performing treatments and interventions, ordering and review of laboratory studies, ordering and review of radiographic studies, pulse oximetry, re-evaluation of patient's condition and participation in multidisciplinary rounds.  Jacky Kindle MD [facility] See Amion for pager If no response to pager, please call [phone] until 7pm After 7pm, Please call E-link [phone]  01/14/2021, 11:12 AM

## 2021-01-14 NOTE — Progress Notes (Signed)
SLP Cancellation Note  Patient Details Name: Stephen Delgado MRN: [id number] DOB: 1965-05-12   Cancelled treatment:       Reason Eval/Treat Not Completed: Other (comment) Patient with new tracheostomy. Orders for SLP eval and treat for PMSV and swallowing received. Will follow pt closely for readiness for SLP interventions as appropriate.     Shirely Toren, Riley Nearing 01/14/2021, 7:42 AM

## 2021-01-14 NOTE — Progress Notes (Addendum)
STROKE TEAM PROGRESS NOTE   INTERVAL HISTORY Patient seen in room with no family at the bedside. He received a tracheostomy yesterday and plan is to insert a PEG tube soon.  Vitals:   01/14/21 1200 01/14/21 1300 01/14/21 1400 01/14/21 1522  BP: (!) 155/77 (!) 145/72 (!) 143/73 (!) 148/76  Pulse: 96 (!) 101 98 (!) 106  Resp: (!) 23 17 10  (!) 26  Temp: (!) 100.4 F (38 C)     TempSrc: Axillary     SpO2: 97% 99% 100% 97%  Weight:      Height:       CBC:  Recent Labs  Lab 01/13/21 0712 01/14/21 0358  WBC 14.9* 11.9*  NEUTROABS 13.0* 10.1*  HGB 10.1* 9.5*  HCT 30.3* 29.5*  MCV 72.8* 74.9*  PLT 261 274    Basic Metabolic Panel:  Recent Labs  Lab 01/11/21 0323 01/12/21 1301 01/13/21 0712 01/14/21 0358  NA 144   < > 143 145  K 4.5   < > 3.1* 3.6  CL 113*   < > 111 115*  CO2 17*   < > 21* 22  GLUCOSE 178*   < > 126* 174*  BUN 67*   < > 79* 82*  CREATININE 2.98*   < > 2.66* 2.65*  CALCIUM 8.1*   < > 7.9* 8.0*  MG 2.7*  --   --   --   PHOS 5.8*  --   --   --    < > = values in this interval not displayed.    Lipid Panel:  Recent Labs  Lab 01/12/21 1301  TRIG 139     IMAGING past 24 hours No results found.  PHYSICAL EXAM General:  Patient is a well-developed, well-nourished middle-aged African-American male who is intubated.  Respiratory: Patient is intubated, on ventilator   Neurological:Patient does not respond to voice or sternal rub. PERRL, doll's eyes reflex present, corneal reflexes present bilaterally. Cough and gag reflexs present.  Will flicker LUE to noxious stimuli but no response in other extremities.  ASSESSMENT/PLAN Stephen Delgado is a 55 y.o. male with history of HTN (questionable compliance with medications), DM and CKD 3 presenting with headache and altered mental status. 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  He was hypertensive on arrival to 238/160 and was given Cleviprex for BP control.  He was  also agitated and required restraints.  Repeat head CT 11/30 shows unchanged IPH with IVH  Head CTs done on 12/1 and 12/3 also demonstrates unchanged IPH with IVH.  MRI/MRA completed on 12/4.patient's neurological exam continues to remain the same.  Goals of care conversation happened 12/11. Tracheostomy performed 12/13, plan for PEG soon.   ICH score 2  ICH with IVH - left basal ganglia ICH with IVH likely secondary to hypertensive emergency  bilateral lacunar infarcts - likely small vessel disease vs. Strict BP control in the setting of ICH CT head Large basal ganglia IPH with IVH involving the lateral, third and fourth ventricles with no significant mass effect or midline shift.  Repeat CT 11/30 0116 Unchanged IPH with IVH Repeat CT head 11/30 1629  Stable IPH with IVH, stable mild hydrocephalus Repeat CT Head 12/6 - Unchanged IVH and left caudate head intraparenchymal component. No new site of hemorrhage. 2D Echo EF 50-55%, grade 1 diastolic dysfunction, no atrial level shunt MRI-10/3-Numerous small acute infarcts in bilateral cerebral hemispheres. Minimal involvement of the  right superior cerebellum.  Left caudate hemorrhage, intraventricular hemorrhage, and scattered sulcal subarachnoid hemorrhage as seen on recent CT imaging. Associated mild communicating hydrocephalus. Numerous chronic microhemorrhages probably secondary to hypertension. Chronic microvascular ischemic changes MRA head and neck 10/4- Mildly tortuous cervical carotid and vertebral arteries without stenosis. Intracranial hemorrhage and ventricles appears stable from the MRI yesterday. CUS unremarkable LDL 111.4 HgbA1c 6.3 VTE prophylaxis - SCDs aspirin 81 mg daily prior to admission, now on No antithrombotic secondary to [place] Therapy recommendations:  SNF Disposition:  pending  Respiratory distress Intubated and ventilated CCM on board Tachypnea- PRN fentanyl and Versed ETT exchanged 12/4 due to a cuff leak Precedex for  sedation currently running. Prop is on hold  Hypertensive emergency: improving. Home meds:  Amlodipine 10 mg daily, metoprolol succinate 25 mg daily,  Unstable No longer requiring Cardene to maintain BP at goal.  SBP goal <160 Currently On norvasc 10mg , coreg 25mg , Lisinopril 40mg , Clonidine 0.3 mg q8h and minoxidil 10mg  daily Long-term BP goal normotensive  Hyperlipidemia Home meds:  none LDL 111.4, goal < 70 Hold off statin for now due to acute ICH Consider statin at discharge         AKI on CKD Cre 2.98->2.45-> 2.65 today On TF Close monitoring I/O CCM on board        Other Stroke Risk Factors Questionable compliance with medications at home        Other Active Problems Code status - full code Hypokalemia 3.1 -> 3.4 -> 3.5-> 4.5->3.6-> 3.1-> 3.6 Hypernatremia- 153->152->149->148-> 144->142->143-> 145  - free water 200 ml Q 2 hours Ileus seen on KUB- fleet enema given 12/1; bowel regimen in place Aspiration pneumonia -> Ancef started 12/5 per CCM - temp 97.5- WBCs 20.5->12.2->15.9-> 15.6->17.6->18.1->14.9-> 11.9 Anemia - Hb 13.0->10.8->10.6-> 11.2->11.3->11.2->10.2-> 9.5  Hospital day # 15  Patient seen and examined by NP/APP with MD. MD to update note as needed.   [place] , MSN, AGACNP-BC [facility] See Amion for schedule and pager information 01/14/2021 4:27 PM  ATTENDING NOTE: I reviewed above note and agree with the assessment and plan. Pt was seen and examined.   No family at the bedside. Pt had trach yesterday and tolerating well. Now on weaning, plan for trach collar if doing well. Pt neuro unchanged, still unresponsive, withdraw to pain on the LUE. Still has intermittent fever, Tmax 101.3, has completed Abx recently, will repeat UA, CXR, sputum and blood culture. Now on precedex. On TF via cortrak. Will consult trauma surgery for PEG tube. CCM on board. continue close monitoring.   For detailed assessment and plan, please refer to  above as I have made changes wherever appropriate.   Rosalin Hawking, MD PhD Stroke Neurology 01/14/2021 6:21 PM  This patient is critically ill due to [place], IVH, multifocal ischemic infarcts, hypertensive emergency, persistent fever, respiratory failure status post tracheostomy and at significant risk of neurological worsening, death form sepsis, brain herniation, seizure, obstructive hydrocephalus. This patient's care requires constant monitoring of vital signs, hemodynamics, respiratory and cardiac monitoring, review of multiple databases, neurological assessment, discussion with family, other specialists and medical decision making of high complexity. I spent 35 minutes of neurocritical care time in the care of this patient.  I discussed with Dr. Tacy Learn CCM.      To contact Stroke Continuity provider, please refer to [email or web address] After hours, contact General Neurology

## 2021-01-14 NOTE — Progress Notes (Signed)
Palliative Care Progress Note, Assessment & Plan   Patient Name: Stephen Delgado       Date: 01/14/2021 DOB: May 12, 1965  Age: 55 y.o. MRN#: [id number] Attending Physician: Stroke, Md, MD Primary Care Physician: Patient, [name] Admit Date: 12/30/2020  Reason for Consultation/Follow-up: Establishing goals of care  Subjective: Patient is lying in bed with trach collar in place and core track to left nare.  Patient's brother is at bedside.  HPI: Patient is a 55 year old male that presented with left basal ganglia intraparenchymal hemorrhage with IVH.  Patient has been converted from vent to trach and has a core track in place.  Plan of Care: I have reviewed medical records including EPIC notes, labs and imaging, assessed the patient and then met with patient's brother at bedside to discuss diagnosis prognosis, GOC, disposition, and options.  Brother at bedside is a Statistician at [employer] and in [place].  He endorses he is well aware of the patient's current health status as well as prognosis.  He says he is more realistic about the patient's potential for no improvement and he is helping the family have a more realistic view of the patient's potential future.  He shares they are hoping for the best and are taking it 1 day at a time.    He is concerned with next steps regarding where the patient will be placed for rehab.  I shared that the [facility] social worker should be able to speak to these details with him.  He also shares concerns that the patient's insurance has not been renewed for 2023.  Again, I reiterated that the social worker will be able to answer and be of assistance with these questions.  TOC SW made aware of brother's concerns.  I made the brother aware that PMT will  continue to shadow the patient's chart and monitor him throughout his hospitalization.  We will not intervene unless the family reaches out to PMT or the patient experiences a significant decline.  Brother was appreciative of PMT support and has our contact information should he need to reach out to PMT.  Questions and concerns were addressed. The family was encouraged to call with questions or concerns.   Care plan was discussed with patient's brother  Code Status: Full code  Prognosis:  Unable to determine  Discharge Planning: To Be Determined  Recommendations/Plan: Full code TOC to speak with family regarding insurance and rehab inquiries  Physical Exam Constitutional:      General: He is not in acute distress. HENT:     Head: Normocephalic and atraumatic.     Nose:     Comments: Cortrak in left nare    Mouth/Throat:     Mouth: Mucous membranes are moist.  Cardiovascular:     Rate and Rhythm: Normal rate.  Pulmonary:     Comments: Trach collar Abdominal:     Palpations: Abdomen is soft.  Musculoskeletal:     Comments: Flaccid, no movement  Skin:    General: Skin is warm and dry.  Neurological:     Comments: Non-responsive                Palliative Assessment/Data: 10%  Total Time 15 minutes  Greater than 50%  of this time was spent counseling and coordinating care related to the above assessment and plan.  Thank you for allowing the Palliative Medicine Team to assist in the care of this patient.  [place] Ilsa Iha, FNP-BC Palliative Medicine Team Team Phone # [phone]

## 2021-01-14 NOTE — Progress Notes (Signed)
Pt placed on 28% ATC and is tolerating well. RT will continue to monitor.

## 2021-01-15 ENCOUNTER — Encounter (HOSPITAL_COMMUNITY): Payer: Self-pay | Admitting: Neurology

## 2021-01-15 DIAGNOSIS — I610 Nontraumatic intracerebral hemorrhage in hemisphere, subcortical: Secondary | ICD-10-CM | POA: Diagnosis not present

## 2021-01-15 DIAGNOSIS — I639 Cerebral infarction, unspecified: Secondary | ICD-10-CM

## 2021-01-15 DIAGNOSIS — G9341 Metabolic encephalopathy: Secondary | ICD-10-CM | POA: Diagnosis not present

## 2021-01-15 DIAGNOSIS — J9601 Acute respiratory failure with hypoxia: Secondary | ICD-10-CM | POA: Diagnosis not present

## 2021-01-15 DIAGNOSIS — I161 Hypertensive emergency: Secondary | ICD-10-CM | POA: Diagnosis not present

## 2021-01-15 DIAGNOSIS — I615 Nontraumatic intracerebral hemorrhage, intraventricular: Secondary | ICD-10-CM | POA: Diagnosis not present

## 2021-01-15 LAB — CBC WITH DIFFERENTIAL/PLATELET
Abs Immature Granulocytes: 0.2 10*3/uL — ABNORMAL HIGH (ref 0.00–0.07)
Basophils Absolute: 0 10*3/uL (ref 0.0–0.1)
Basophils Relative: 0 %
Eosinophils Absolute: 0.3 10*3/uL (ref 0.0–0.5)
Eosinophils Relative: 2 %
HCT: 30.9 % — ABNORMAL LOW (ref 39.0–52.0)
Hemoglobin: 10 g/dL — ABNORMAL LOW (ref 13.0–17.0)
Immature Granulocytes: 1 %
Lymphocytes Relative: 7 %
Lymphs Abs: 1 10*3/uL (ref 0.7–4.0)
MCH: 24 pg — ABNORMAL LOW (ref 26.0–34.0)
MCHC: 32.4 g/dL (ref 30.0–36.0)
MCV: 74.3 fL — ABNORMAL LOW (ref 80.0–100.0)
Monocytes Absolute: 1 10*3/uL (ref 0.1–1.0)
Monocytes Relative: 7 %
Neutro Abs: 12.6 10*3/uL — ABNORMAL HIGH (ref 1.7–7.7)
Neutrophils Relative %: 83 %
Platelets: 271 10*3/uL (ref 150–400)
RBC: 4.16 MIL/uL — ABNORMAL LOW (ref 4.22–5.81)
RDW: 15.4 % (ref 11.5–15.5)
WBC: 15.1 10*3/uL — ABNORMAL HIGH (ref 4.0–10.5)
nRBC: 0 % (ref 0.0–0.2)

## 2021-01-15 LAB — BASIC METABOLIC PANEL
Anion gap: 11 (ref 5–15)
Anion gap: 10 (ref 5–15)
BUN: 69 mg/dL — ABNORMAL HIGH (ref 6–20)
BUN: 67 mg/dL — ABNORMAL HIGH (ref 6–20)
CO2: 19 mmol/L — ABNORMAL LOW (ref 22–32)
CO2: 27 mmol/L (ref 22–32)
Calcium: 8.4 mg/dL — ABNORMAL LOW (ref 8.9–10.3)
Calcium: 8.8 mg/dL — ABNORMAL LOW (ref 8.9–10.3)
Chloride: 124 mmol/L — ABNORMAL HIGH (ref 98–111)
Chloride: 119 mmol/L — ABNORMAL HIGH (ref 98–111)
Creatinine, Ser: 2.11 mg/dL — ABNORMAL HIGH (ref 0.61–1.24)
Creatinine, Ser: 2.15 mg/dL — ABNORMAL HIGH (ref 0.61–1.24)
GFR, Estimated: 36 mL/min — ABNORMAL LOW (ref >60)
GFR, Estimated: 35 mL/min — ABNORMAL LOW (ref >60)
Glucose, Bld: 168 mg/dL — ABNORMAL HIGH (ref 70–99)
Glucose, Bld: 189 mg/dL — ABNORMAL HIGH (ref 70–99)
Potassium: 4.8 mmol/L (ref 3.5–5.1)
Potassium: 3.9 mmol/L (ref 3.5–5.1)
Sodium: 154 mmol/L — ABNORMAL HIGH (ref 135–145)
Sodium: 156 mmol/L — ABNORMAL HIGH (ref 135–145)

## 2021-01-15 LAB — HEPATIC FUNCTION PANEL
ALT: 97 U/L — ABNORMAL HIGH (ref 0–44)
AST: 68 U/L — ABNORMAL HIGH (ref 15–41)
Albumin: 2 g/dL — ABNORMAL LOW (ref 3.5–5.0)
Alkaline Phosphatase: 96 U/L (ref 38–126)
Bilirubin, Direct: 0.2 mg/dL (ref 0.0–0.2)
Indirect Bilirubin: 0.4 mg/dL (ref 0.3–0.9)
Total Bilirubin: 0.6 mg/dL (ref 0.3–1.2)
Total Protein: 6.2 g/dL — ABNORMAL LOW (ref 6.5–8.1)

## 2021-01-15 LAB — GLUCOSE, CAPILLARY
Glucose-Capillary: 167 mg/dL — ABNORMAL HIGH (ref 70–99)
Glucose-Capillary: 153 mg/dL — ABNORMAL HIGH (ref 70–99)
Glucose-Capillary: 183 mg/dL — ABNORMAL HIGH (ref 70–99)
Glucose-Capillary: 203 mg/dL — ABNORMAL HIGH (ref 70–99)
Glucose-Capillary: 175 mg/dL — ABNORMAL HIGH (ref 70–99)
Glucose-Capillary: 167 mg/dL — ABNORMAL HIGH (ref 70–99)

## 2021-01-15 LAB — SODIUM: Sodium: 154 mmol/L — ABNORMAL HIGH (ref 135–145)

## 2021-01-15 LAB — TRIGLYCERIDES: Triglycerides: 113 mg/dL (ref <150)

## 2021-01-15 MED ORDER — DEXTROSE 5 % IV SOLN: INTRAVENOUS | Status: DC

## 2021-01-15 MED ORDER — FUROSEMIDE 10 MG/ML IJ SOLN
40.0000 mg | Freq: Once | INTRAMUSCULAR | Status: AC
  Administered 2021-01-15: 40 mg via INTRAVENOUS
  Filled 2021-01-15: qty 4

## 2021-01-15 MED ORDER — CLONIDINE HCL 0.1 MG PO TABS
0.1000 mg | ORAL_TABLET | Freq: Three times a day (TID) | ORAL | Status: DC
Start: 2021-01-15 — End: 2021-02-12
  Administered 2021-01-15 – 2021-02-12 (×81): 0.1 mg
  Filled 2021-01-15 (×85): qty 1

## 2021-01-15 MED ORDER — PIPERACILLIN-TAZOBACTAM 3.375 G IVPB
3.3750 g | Freq: Three times a day (TID) | INTRAVENOUS | Status: AC
  Administered 2021-01-15 – 2021-01-21 (×20): 3.375 g via INTRAVENOUS
  Filled 2021-01-15 (×20): qty 50

## 2021-01-15 MED ORDER — INSULIN ASPART 100 UNIT/ML IJ SOLN
0.0000 [IU] | INTRAMUSCULAR | Status: DC
  Administered 2021-01-15 (×2): 4 [IU] via SUBCUTANEOUS
  Administered 2021-01-15: 7 [IU] via SUBCUTANEOUS
  Administered 2021-01-15: 4 [IU] via SUBCUTANEOUS
  Administered 2021-01-16 – 2021-01-17 (×5): 3 [IU] via SUBCUTANEOUS
  Administered 2021-01-17: 4 [IU] via SUBCUTANEOUS
  Administered 2021-01-18: 04:00:00 3 [IU] via SUBCUTANEOUS
  Administered 2021-01-18: 13:00:00 4 [IU] via SUBCUTANEOUS
  Administered 2021-01-18: 22:00:00 3 [IU] via SUBCUTANEOUS
  Administered 2021-01-18 – 2021-01-19 (×3): 4 [IU] via SUBCUTANEOUS
  Administered 2021-01-19 (×2): 3 [IU] via SUBCUTANEOUS
  Administered 2021-01-19 (×2): 4 [IU] via SUBCUTANEOUS
  Administered 2021-01-19 – 2021-01-22 (×14): 3 [IU] via SUBCUTANEOUS
  Administered 2021-01-23: 14:00:00 4 [IU] via SUBCUTANEOUS
  Administered 2021-01-24: 01:00:00 3 [IU] via SUBCUTANEOUS
  Administered 2021-01-24: 08:00:00 4 [IU] via SUBCUTANEOUS
  Administered 2021-01-24 – 2021-01-25 (×3): 3 [IU] via SUBCUTANEOUS
  Administered 2021-01-25: 08:00:00 4 [IU] via SUBCUTANEOUS
  Administered 2021-01-25 – 2021-01-26 (×5): 3 [IU] via SUBCUTANEOUS
  Administered 2021-01-26: 18:00:00 4 [IU] via SUBCUTANEOUS
  Administered 2021-01-26 – 2021-01-27 (×3): 3 [IU] via SUBCUTANEOUS
  Administered 2021-01-27: 09:00:00 4 [IU] via SUBCUTANEOUS
  Administered 2021-01-27 (×2): 3 [IU] via SUBCUTANEOUS
  Administered 2021-01-27: 14:00:00 4 [IU] via SUBCUTANEOUS
  Administered 2021-01-28: 17:00:00 3 [IU] via SUBCUTANEOUS
  Administered 2021-01-28: 08:00:00 4 [IU] via SUBCUTANEOUS
  Administered 2021-01-28 (×3): 3 [IU] via SUBCUTANEOUS
  Administered 2021-01-29 (×3): 4 [IU] via SUBCUTANEOUS
  Administered 2021-01-29 – 2021-01-30 (×3): 3 [IU] via SUBCUTANEOUS
  Administered 2021-01-30 (×3): 4 [IU] via SUBCUTANEOUS
  Administered 2021-01-30 – 2021-02-02 (×13): 3 [IU] via SUBCUTANEOUS
  Administered 2021-02-02 (×2): 4 [IU] via SUBCUTANEOUS
  Administered 2021-02-02 – 2021-02-03 (×8): 3 [IU] via SUBCUTANEOUS
  Administered 2021-02-03 – 2021-02-04 (×2): 4 [IU] via SUBCUTANEOUS
  Administered 2021-02-04: 3 [IU] via SUBCUTANEOUS
  Administered 2021-02-04: 4 [IU] via SUBCUTANEOUS
  Administered 2021-02-04 – 2021-02-05 (×5): 3 [IU] via SUBCUTANEOUS
  Administered 2021-02-05 (×2): 4 [IU] via SUBCUTANEOUS
  Administered 2021-02-05 – 2021-02-08 (×19): 3 [IU] via SUBCUTANEOUS
  Administered 2021-02-08: 4 [IU] via SUBCUTANEOUS
  Administered 2021-02-09 (×2): 3 [IU] via SUBCUTANEOUS
  Administered 2021-02-09 (×3): 4 [IU] via SUBCUTANEOUS
  Administered 2021-02-09 – 2021-02-10 (×5): 3 [IU] via SUBCUTANEOUS
  Administered 2021-02-10 – 2021-02-11 (×4): 4 [IU] via SUBCUTANEOUS
  Administered 2021-02-11 (×3): 3 [IU] via SUBCUTANEOUS
  Administered 2021-02-12 (×2): 4 [IU] via SUBCUTANEOUS
  Administered 2021-02-12 (×2): 3 [IU] via SUBCUTANEOUS
  Administered 2021-02-12 – 2021-02-13 (×8): 4 [IU] via SUBCUTANEOUS
  Administered 2021-02-14: 3 [IU] via SUBCUTANEOUS
  Administered 2021-02-14: 4 [IU] via SUBCUTANEOUS
  Administered 2021-02-14 (×4): 3 [IU] via SUBCUTANEOUS
  Administered 2021-02-15 (×2): 4 [IU] via SUBCUTANEOUS
  Administered 2021-02-15: 3 [IU] via SUBCUTANEOUS
  Administered 2021-02-15: 4 [IU] via SUBCUTANEOUS
  Administered 2021-02-15: 3 [IU] via SUBCUTANEOUS
  Administered 2021-02-16 (×2): 4 [IU] via SUBCUTANEOUS
  Administered 2021-02-16: 7 [IU] via SUBCUTANEOUS
  Administered 2021-02-16 – 2021-02-17 (×8): 4 [IU] via SUBCUTANEOUS
  Administered 2021-02-17: 7 [IU] via SUBCUTANEOUS
  Administered 2021-02-18 (×3): 4 [IU] via SUBCUTANEOUS
  Administered 2021-02-18: 2 [IU] via SUBCUTANEOUS
  Administered 2021-02-18: 7 [IU] via SUBCUTANEOUS
  Administered 2021-02-18 – 2021-02-19 (×4): 4 [IU] via SUBCUTANEOUS
  Administered 2021-02-19: 3 [IU] via SUBCUTANEOUS
  Administered 2021-02-19: 7 [IU] via SUBCUTANEOUS
  Administered 2021-02-19 – 2021-02-20 (×2): 4 [IU] via SUBCUTANEOUS
  Administered 2021-02-20: 11 [IU] via SUBCUTANEOUS
  Administered 2021-02-20 (×2): 4 [IU] via SUBCUTANEOUS
  Administered 2021-02-20 (×2): 7 [IU] via SUBCUTANEOUS
  Administered 2021-02-21: 11 [IU] via SUBCUTANEOUS
  Administered 2021-02-21: 4 [IU] via SUBCUTANEOUS
  Administered 2021-02-21: 7 [IU] via SUBCUTANEOUS
  Administered 2021-02-21: 4 [IU] via SUBCUTANEOUS
  Administered 2021-02-21: 11 [IU] via SUBCUTANEOUS
  Administered 2021-02-21 – 2021-02-22 (×7): 4 [IU] via SUBCUTANEOUS
  Administered 2021-02-23: 7 [IU] via SUBCUTANEOUS
  Administered 2021-02-23 (×2): 4 [IU] via SUBCUTANEOUS
  Administered 2021-02-23: 7 [IU] via SUBCUTANEOUS
  Administered 2021-02-23 – 2021-02-24 (×3): 4 [IU] via SUBCUTANEOUS
  Administered 2021-02-24: 09:00:00 3 [IU] via SUBCUTANEOUS
  Administered 2021-02-24: 12:00:00 7 [IU] via SUBCUTANEOUS
  Administered 2021-02-24 (×3): 4 [IU] via SUBCUTANEOUS
  Administered 2021-02-25: 20:00:00 3 [IU] via SUBCUTANEOUS
  Administered 2021-02-25: 7 [IU] via SUBCUTANEOUS
  Administered 2021-02-25 (×2): 4 [IU] via SUBCUTANEOUS
  Administered 2021-02-25: 12:00:00 3 [IU] via SUBCUTANEOUS
  Administered 2021-02-25 – 2021-02-27 (×10): 4 [IU] via SUBCUTANEOUS
  Administered 2021-02-27: 3 [IU] via SUBCUTANEOUS
  Administered 2021-02-27: 7 [IU] via SUBCUTANEOUS
  Administered 2021-02-28: 3 [IU] via SUBCUTANEOUS
  Administered 2021-02-28: 4 [IU] via SUBCUTANEOUS
  Administered 2021-02-28 (×2): 3 [IU] via SUBCUTANEOUS
  Administered 2021-02-28: 4 [IU] via SUBCUTANEOUS
  Administered 2021-02-28 – 2021-03-01 (×3): 3 [IU] via SUBCUTANEOUS
  Administered 2021-03-01 – 2021-03-02 (×4): 4 [IU] via SUBCUTANEOUS
  Administered 2021-03-02 – 2021-03-03 (×7): 3 [IU] via SUBCUTANEOUS
  Administered 2021-03-03 – 2021-03-04 (×2): 4 [IU] via SUBCUTANEOUS
  Administered 2021-03-04 (×3): 3 [IU] via SUBCUTANEOUS
  Administered 2021-03-04: 4 [IU] via SUBCUTANEOUS
  Administered 2021-03-05: 3 [IU] via SUBCUTANEOUS
  Administered 2021-03-05: 4 [IU] via SUBCUTANEOUS
  Administered 2021-03-05 (×2): 3 [IU] via SUBCUTANEOUS
  Administered 2021-03-05: 4 [IU] via SUBCUTANEOUS
  Administered 2021-03-06: 3 [IU] via SUBCUTANEOUS
  Administered 2021-03-06: 4 [IU] via SUBCUTANEOUS
  Administered 2021-03-06: 3 [IU] via SUBCUTANEOUS
  Administered 2021-03-06: 4 [IU] via SUBCUTANEOUS
  Administered 2021-03-06 (×2): 3 [IU] via SUBCUTANEOUS
  Administered 2021-03-07 (×3): 4 [IU] via SUBCUTANEOUS
  Administered 2021-03-07 – 2021-03-08 (×3): 3 [IU] via SUBCUTANEOUS
  Administered 2021-03-08: 4 [IU] via SUBCUTANEOUS
  Administered 2021-03-08 (×3): 3 [IU] via SUBCUTANEOUS
  Administered 2021-03-09: 4 [IU] via SUBCUTANEOUS
  Administered 2021-03-09 (×2): 3 [IU] via SUBCUTANEOUS
  Administered 2021-03-09: 4 [IU] via SUBCUTANEOUS
  Administered 2021-03-09 (×2): 3 [IU] via SUBCUTANEOUS
  Administered 2021-03-10: 4 [IU] via SUBCUTANEOUS
  Administered 2021-03-10: 3 [IU] via SUBCUTANEOUS
  Administered 2021-03-10: 2 [IU] via SUBCUTANEOUS
  Administered 2021-03-10 – 2021-03-12 (×11): 3 [IU] via SUBCUTANEOUS
  Administered 2021-03-12: 4 [IU] via SUBCUTANEOUS
  Administered 2021-03-12 – 2021-03-13 (×2): 3 [IU] via SUBCUTANEOUS
  Administered 2021-03-13: 4 [IU] via SUBCUTANEOUS
  Administered 2021-03-13: 3 [IU] via SUBCUTANEOUS
  Administered 2021-03-13: 4 [IU] via SUBCUTANEOUS
  Administered 2021-03-13 – 2021-03-14 (×5): 3 [IU] via SUBCUTANEOUS
  Administered 2021-03-14: 4 [IU] via SUBCUTANEOUS
  Administered 2021-03-14 – 2021-03-15 (×3): 3 [IU] via SUBCUTANEOUS
  Administered 2021-03-15 (×3): 4 [IU] via SUBCUTANEOUS
  Administered 2021-03-15 (×2): 3 [IU] via SUBCUTANEOUS
  Administered 2021-03-15: 4 [IU] via SUBCUTANEOUS
  Administered 2021-03-16 (×2): 3 [IU] via SUBCUTANEOUS
  Administered 2021-03-16 (×3): 4 [IU] via SUBCUTANEOUS
  Administered 2021-03-17: 3 [IU] via SUBCUTANEOUS
  Administered 2021-03-17: 4 [IU] via SUBCUTANEOUS
  Administered 2021-03-17 (×3): 3 [IU] via SUBCUTANEOUS
  Administered 2021-03-17 – 2021-03-18 (×3): 4 [IU] via SUBCUTANEOUS
  Administered 2021-03-18 – 2021-03-19 (×4): 3 [IU] via SUBCUTANEOUS
  Administered 2021-03-19 (×3): 4 [IU] via SUBCUTANEOUS
  Administered 2021-03-19 (×2): 3 [IU] via SUBCUTANEOUS
  Administered 2021-03-22: 4 [IU] via SUBCUTANEOUS
  Administered 2021-03-22 (×2): 3 [IU] via SUBCUTANEOUS
  Administered 2021-03-22: 7 [IU] via SUBCUTANEOUS
  Administered 2021-03-23 (×4): 4 [IU] via SUBCUTANEOUS
  Administered 2021-03-23: 3 [IU] via SUBCUTANEOUS
  Administered 2021-03-23 – 2021-03-24 (×2): 4 [IU] via SUBCUTANEOUS
  Administered 2021-03-24 (×2): 3 [IU] via SUBCUTANEOUS
  Administered 2021-03-24 – 2021-03-26 (×10): 4 [IU] via SUBCUTANEOUS
  Administered 2021-03-26: 3 [IU] via SUBCUTANEOUS
  Administered 2021-03-26 – 2021-03-27 (×6): 4 [IU] via SUBCUTANEOUS
  Administered 2021-03-27: 3 [IU] via SUBCUTANEOUS
  Administered 2021-03-27 (×2): 4 [IU] via SUBCUTANEOUS
  Administered 2021-03-27: 3 [IU] via SUBCUTANEOUS
  Administered 2021-03-28 – 2021-03-29 (×9): 4 [IU] via SUBCUTANEOUS
  Administered 2021-03-29: 3 [IU] via SUBCUTANEOUS
  Administered 2021-03-29 – 2021-03-30 (×3): 4 [IU] via SUBCUTANEOUS
  Administered 2021-03-30 (×2): 3 [IU] via SUBCUTANEOUS
  Administered 2021-03-30: 4 [IU] via SUBCUTANEOUS
  Administered 2021-03-30: 3 [IU] via SUBCUTANEOUS
  Administered 2021-03-30: 4 [IU] via SUBCUTANEOUS
  Administered 2021-03-31 (×4): 3 [IU] via SUBCUTANEOUS
  Administered 2021-04-01: 4 [IU] via SUBCUTANEOUS
  Administered 2021-04-01 (×2): 3 [IU] via SUBCUTANEOUS
  Administered 2021-04-01: 4 [IU] via SUBCUTANEOUS
  Administered 2021-04-01 (×2): 3 [IU] via SUBCUTANEOUS
  Administered 2021-04-02: 4 [IU] via SUBCUTANEOUS
  Administered 2021-04-02: 3 [IU] via SUBCUTANEOUS
  Administered 2021-04-02: 4 [IU] via SUBCUTANEOUS
  Administered 2021-04-02: 3 [IU] via SUBCUTANEOUS
  Administered 2021-04-02 (×2): 4 [IU] via SUBCUTANEOUS
  Administered 2021-04-03: 3 [IU] via SUBCUTANEOUS
  Administered 2021-04-03: 4 [IU] via SUBCUTANEOUS
  Administered 2021-04-03: 3 [IU] via SUBCUTANEOUS
  Administered 2021-04-03 (×4): 4 [IU] via SUBCUTANEOUS
  Administered 2021-04-04 (×3): 3 [IU] via SUBCUTANEOUS
  Administered 2021-04-04: 4 [IU] via SUBCUTANEOUS
  Administered 2021-04-04 – 2021-04-05 (×2): 3 [IU] via SUBCUTANEOUS
  Administered 2021-04-05 – 2021-04-06 (×5): 4 [IU] via SUBCUTANEOUS
  Administered 2021-04-06 (×2): 3 [IU] via SUBCUTANEOUS
  Administered 2021-04-06 (×2): 4 [IU] via SUBCUTANEOUS
  Administered 2021-04-06: 3 [IU] via SUBCUTANEOUS
  Administered 2021-04-06: 4 [IU] via SUBCUTANEOUS
  Administered 2021-04-07: 3 [IU] via SUBCUTANEOUS
  Administered 2021-04-07 (×2): 4 [IU] via SUBCUTANEOUS
  Administered 2021-04-07: 3 [IU] via SUBCUTANEOUS
  Administered 2021-04-07 – 2021-04-08 (×2): 4 [IU] via SUBCUTANEOUS
  Administered 2021-04-08: 3 [IU] via SUBCUTANEOUS
  Administered 2021-04-08 (×3): 4 [IU] via SUBCUTANEOUS
  Administered 2021-04-08 – 2021-04-09 (×3): 3 [IU] via SUBCUTANEOUS
  Administered 2021-04-09 (×2): 4 [IU] via SUBCUTANEOUS
  Administered 2021-04-09 – 2021-04-11 (×3): 3 [IU] via SUBCUTANEOUS
  Administered 2021-04-11: 4 [IU] via SUBCUTANEOUS
  Administered 2021-04-11 – 2021-04-12 (×4): 3 [IU] via SUBCUTANEOUS
  Administered 2021-04-12: 4 [IU] via SUBCUTANEOUS
  Administered 2021-04-13 – 2021-04-14 (×6): 3 [IU] via SUBCUTANEOUS
  Administered 2021-04-15: 4 [IU] via SUBCUTANEOUS
  Administered 2021-04-15: 3 [IU] via SUBCUTANEOUS
  Administered 2021-04-15: 4 [IU] via SUBCUTANEOUS
  Administered 2021-04-15 (×2): 3 [IU] via SUBCUTANEOUS
  Administered 2021-04-16: 2 [IU] via SUBCUTANEOUS
  Administered 2021-04-16 – 2021-04-17 (×8): 3 [IU] via SUBCUTANEOUS
  Administered 2021-04-17: 4 [IU] via SUBCUTANEOUS
  Administered 2021-04-17 – 2021-04-20 (×12): 3 [IU] via SUBCUTANEOUS
  Administered 2021-04-20: 4 [IU] via SUBCUTANEOUS
  Administered 2021-04-20 – 2021-05-02 (×36): 3 [IU] via SUBCUTANEOUS
  Administered 2021-05-05 (×2): 4 [IU] via SUBCUTANEOUS
  Administered 2021-05-05 (×3): 3 [IU] via SUBCUTANEOUS
  Administered 2021-05-06 – 2021-05-07 (×11): 4 [IU] via SUBCUTANEOUS
  Administered 2021-05-08 – 2021-05-11 (×4): 3 [IU] via SUBCUTANEOUS
  Administered 2021-05-11: 4 [IU] via SUBCUTANEOUS
  Administered 2021-05-11 (×3): 3 [IU] via SUBCUTANEOUS
  Administered 2021-05-11: 4 [IU] via SUBCUTANEOUS
  Administered 2021-05-12: 3 [IU] via SUBCUTANEOUS
  Administered 2021-05-12: 4 [IU] via SUBCUTANEOUS
  Administered 2021-05-12: 3 [IU] via SUBCUTANEOUS
  Administered 2021-05-12: 4 [IU] via SUBCUTANEOUS
  Administered 2021-05-12: 3 [IU] via SUBCUTANEOUS
  Administered 2021-05-13 (×2): 4 [IU] via SUBCUTANEOUS
  Administered 2021-05-13 – 2021-05-14 (×5): 3 [IU] via SUBCUTANEOUS
  Administered 2021-05-14: 4 [IU] via SUBCUTANEOUS
  Administered 2021-05-14: 3 [IU] via SUBCUTANEOUS
  Administered 2021-05-14 – 2021-05-15 (×5): 4 [IU] via SUBCUTANEOUS
  Administered 2021-05-15 – 2021-05-16 (×4): 3 [IU] via SUBCUTANEOUS
  Administered 2021-05-16 (×2): 4 [IU] via SUBCUTANEOUS
  Administered 2021-05-16 – 2021-05-17 (×2): 3 [IU] via SUBCUTANEOUS
  Administered 2021-05-17 (×2): 4 [IU] via SUBCUTANEOUS
  Administered 2021-05-17 (×3): 3 [IU] via SUBCUTANEOUS
  Administered 2021-05-18: 4 [IU] via SUBCUTANEOUS
  Administered 2021-05-18: 3 [IU] via SUBCUTANEOUS
  Administered 2021-05-18: 4 [IU] via SUBCUTANEOUS
  Administered 2021-05-18 – 2021-05-19 (×5): 3 [IU] via SUBCUTANEOUS
  Administered 2021-05-19 (×2): 4 [IU] via SUBCUTANEOUS
  Administered 2021-05-19: 3 [IU] via SUBCUTANEOUS
  Administered 2021-05-19 – 2021-05-20 (×2): 4 [IU] via SUBCUTANEOUS
  Administered 2021-05-20 (×3): 3 [IU] via SUBCUTANEOUS

## 2021-01-15 MED ORDER — PIPERACILLIN-TAZOBACTAM 3.375 G IVPB 30 MIN
3.3750 g | Freq: Once | INTRAVENOUS | Status: AC
  Administered 2021-01-15: 3.375 g via INTRAVENOUS
  Filled 2021-01-15 (×2): qty 50

## 2021-01-15 NOTE — Progress Notes (Signed)
SLP Cancellation Note  Patient Details Name: DANON LOGRASSO MRN: [id number] DOB: 1965-09-15   Cancelled treatment:       Reason Eval/Treat Not Completed: Patient's level of consciousness. Per RN pt remains unresponsive except to pain minimally. Not appropriate cognitively for PMSV at this time. Will d/c orders, please reorder if needed in the future.    Sherrin Stahle, Riley Nearing 01/15/2021, 2:34 PM

## 2021-01-15 NOTE — Progress Notes (Addendum)
NAME:  Colbert CoyerJerry N Krawczyk, MRN:  [id number], DOB:  [birth year], LOS: 16 ADMISSION DATE:  12/30/2020, CONSULTATION DATE:  01/01/21 REFERRING MD:  xu (stroke), CHIEF COMPLAINT:  respiratory failure   History of Present Illness:  55 yo male presented with headache, nausea and altered mental status.  CT head showed acute Lt ICH with IVH likely related to hypertensive emergency (BP 195/116).  Had persistent hypertension and concern for airway protection, and PCCM asked to assist with ICU management.  Pertinent  Medical History  HTN, DM type 2, CKD 3a, OSA  Significant Hospital Events:  11/29 Admit, start cleviprex 12/01 PCCM consulted; changed from cleviprex to cardene due to elevated triglycerides 12/03 intubated; episode of vomiting >> tube feeds held 12/04 resume trickle tube feeds 12/06 remains minimally responsive. Tolerating SBT.  12/12 no acute events overnight, T-max 102 point 12/13 bedside trach planned midmorning  12/14 ATC. Lasix --> 5L UOP  12/15 cont on trach collar. Na to 154 from 145. Adding low rate d5   Studies:  CT head 11/29 >> large amount of IVH involving lateral/3rd/4th ventricles, ICH in Lt BG, chronic microvascular ischemic changes Echo 11/30 >> EF 50 to 55%, mod LVH, grade 1 DD, ascending aorta 37 mm EEG 12/01 >> continuous generalized slowing MRI brain 12/03 >> numerous small acute infarcts in b/l cerebral hemispheres, Lt caudate hemorrhage, IVH, scattered sulcal SAH, mild communicating hydrocephalus 12/5 Started on free water for hypernatremia. Cefazolin for MSSA pneumonia.  Interim History / Subjective:  5L UOP after 40mg  lasix  Na bump from 145 to 154  Cont on trach collar   Objective   Blood pressure (!) 141/76, pulse 95, temperature 100.3 F (37.9 C), temperature source Axillary, resp. rate 18, height 6' (1.829 m), weight 85.7 kg, SpO2 97 %.    FiO2 (%):  [28 %] 28 %   Intake/Output Summary (Last 24 hours) at 01/15/2021 0942 Last data filed at  01/15/2021 0800 Gross per 24 hour  Intake 1835.52 ml  Output 5115 ml  Net -3279.48 ml   Filed Weights   01/13/21 0500 01/14/21 0500 01/15/21 0331  Weight: 85.3 kg 90.6 kg 85.7 kg    Examination: General: Critically ill middle aged M reclined in bed NAD  HEENT: Trach secure. Pink mm anicteric sclera  Neuro: Does not follow commands. 2mm pupils. Slight downward gaze.  CV: regular s1s2 cap refill < 3 sec  PULM: Slightly diminished bibasilar sounds. Symmetrical chest expansion, even unlabored on trach collar  GI: soft ndnt + bowel sounds  Extremities: no acute joint deformity. BLE pitting edema   Assessment & Plan:    Acute ICH and IVH  Bilateral lacunar infarcts  -Left basal ganglia ICH and IVH likely secondary to hypertensive emergency -MRI 12/3 numerous small acute infarcts in bilateral cerebral hemispheres P: Per neuro   Acute respiratory failure with hypoxia  MSSA PNA, improved, s/p ancef course  S/p tracheostomy LLL ASD -- possible Pna?  P: Cont trach collar Pulm hygiene Routine trach care  -adding zosyn  Fever -central vs infectious.  LLL ASD -- possible aspiration  P: Send trach aspirate  Start empiric zosyn (unasyn has higher Na content)  Follow Bcx  Trend fever curve WBC PRN APAP  HTN -hypertensive emergency on admission  P: Cont tele Cont enteral reg If volume status optimization allows weaning of oral reg, would favor decr/ dc clonidine first due to sedating side effects   AKI Volume overload Hypernatremia  -145 to 154. Put out 5L after 1x  40mg  Lasix 12/14 P: Hold repeat lasix 12/15. Big picture needs more volume off though Will add a low rate d5 infusion (75ml / hr) for Na and recheck BMP at noon.   DM2 with hyperglycemia  P: -will incr coverage to rSSI in light of adding low rate d5 for hypernatremia   Elevated transaminases  -LFTs slightly improved P: PRN LFTs Minimize hepatotoxins as able   Protein calorie malnutrition  P: EN per  PRN   Urinary retention P: -urecholine, void trial per RN protocol   Goals of care Palliative has seen and s/o Aggressive care  S/p trach. Pending PEG  Cont ATC efforts and dispo planning    Best Practice (right click and "Reselect all SmartList Selections" daily)   Diet/type: tubefeeds  DVT prophylaxis: prophylactic heparin  GI prophylaxis: PPI Lines: N/A Foley:  foley  Code Status:  full code Family: per primary and palliative  CRITICAL CARE Performed by: 32m   Total critical care time: 42 minutes  Critical care time was exclusive of separately billable procedures and treating other patients.  Critical care was necessary to treat or prevent imminent or life-threatening deterioration.  Critical care was time spent personally by me on the following activities: development of treatment plan with patient and/or surrogate as well as nursing, discussions with consultants, evaluation of patient's response to treatment, examination of patient, obtaining history from patient or surrogate, ordering and performing treatments and interventions, ordering and review of laboratory studies, ordering and review of radiographic studies, pulse oximetry and re-evaluation of patient's condition.  Lanier Clam MSN, AGACNP-BC [facility] Pulmonary/Critical Care Medicine Amion for pager  01/15/2021, 9:42 AM

## 2021-01-15 NOTE — Progress Notes (Addendum)
Occupational Therapy Treatment/Re-evaluation Patient Details Name: Stephen Delgado MRN: [id number] DOB: 1965/04/18 Today's Date: 01/15/2021   History of present illness 55 y/o presented to [facility] ED on 11/29 for AMS, lethargy, and vomiting. Transferred to [facility]. CTH showed L caudate/BG hemorrhage with IVH. Declining respiratory status with snoring respirations. Intubated 12/3-12/14. Trach placed 12/13. PMH: HTN, sleep apnea, DM, CKD 3   OT comments  Pt withdrawing from noxious stimuli BUE; moving head with noxious stimuli to LLE; eyes closed throughout session. Placed in chair position in bed, no increased arousal; no head/postural control. Recommend B Prevalon boots, BUE elevated on pillow to reduce dependent edema and placing pt in modified chair position at least BID. If level of arousal improves, will need rehab at [facility]. Will follow up next week.    Recommendations for follow up therapy are one component of a multi-disciplinary discharge planning process, led by the attending physician.  Recommendations may be updated based on patient status, additional functional criteria and insurance authorization.    Follow Up Recommendations  Skilled nursing-short term rehab (<3 hours/day) (pending progress)    Assistance Recommended at Discharge Frequent or constant Supervision/Assistance  Equipment Recommendations    TBA   Recommendations for Other Services      Precautions / Restrictions Precautions Precautions: Fall Precaution Comments: cortrak, trach collar, flexiseal, foley Restrictions Weight Bearing Restrictions: No       Mobility Bed Mobility               General bed mobility comments: Total A +2; recommend chair osition    Transfers                   General transfer comment: not attempted     Balance                                           ADL either performed or assessed with clinical judgement   ADL                                          General ADL Comments: total A    Extremity/Trunk Assessment Upper Extremity Assessment Upper Extremity Assessment: Difficult to assess due to impaired cognition (moves R/L UE in response to noxious stimuli)   Lower Extremity Assessment Lower Extremity Assessment:  (no movement noted)        Vision   Additional Comments: eyes closed/no opening   Perception     Praxis      Cognition Arousal/Alertness: Lethargic Behavior During Therapy: Flat affect Overall Cognitive Status: Difficult to assess                                            Exercises Exercises: Other exercises Other Exercises Other Exercises: BUE PROM Other Exercises: B UE elevated to reduce dependent edema   Shoulder Instructions       General Comments      Pertinent Vitals/ Pain       Pain Assessment: Faces Facial Expression: Relaxed, neutral Body Movements: Absence of movements Muscle Tension: Relaxed Compliance with ventilator (intubated pts.): N/A Vocalization (extubated pts.): Talking in normal tone or no sound CPOT Total: 0 Pain  Location:  (with noxious stimuli) Pain Descriptors / Indicators:  (withdraw form pain/movement) Pain Intervention(s): Limited activity within patient's tolerance  Home Living                                          Prior Functioning/Environment              Frequency  Min 1X/week        Progress Toward Goals  OT Goals(current goals can now be found in the care plan section)  Progress towards OT goals: Not progressing toward goals - comment (due to level of arousal)  Acute Rehab OT Goals Patient Stated Goal: no family present; pt unable OT Goal Formulation: Patient unable to participate in goal setting Time For Goal Achievement: 01/30/21 Potential to Achieve Goals: Fair ADL Goals Additional ADL Goal #1: Pt will follow 1 step commands 1/4 times as precursor for ADL tasks  Plan Discharge plan  remains appropriate    Co-evaluation                 AM-PAC OT "6 Clicks" Daily Activity     Outcome Measure   Help from another person eating meals?: Total Help from another person taking care of personal grooming?: Total Help from another person toileting, which includes using toliet, bedpan, or urinal?: Total Help from another person bathing (including washing, rinsing, drying)?: Total Help from another person to put on and taking off regular upper body clothing?: Total Help from another person to put on and taking off regular lower body clothing?: Total 6 Click Score: 6    End of Session    OT Visit Diagnosis: Other abnormalities of gait and mobility (R26.89);Muscle weakness (generalized) (M62.81);Low vision, both eyes (H54.2);Other symptoms and signs involving the nervous system (R29.898);Other symptoms and signs involving cognitive function;Cognitive communication deficit (R41.841) Symptoms and signs involving cognitive functions: Nontraumatic SAH;Other Nontraumatic ICH   Activity Tolerance Patient limited by lethargy   Patient Left in bed;with call bell/phone within reach;with nursing/sitter in room (modifiedchair position)   Nurse Communication          Time: 3235-5732 OT Time Calculation (min): 21 min  Charges: OT General Charges $OT Visit: 1 Visit OT Treatments $Therapeutic Activity: 8-22 mins  Luisa Dago, OT/L   Acute OT Clinical Specialist Acute Rehabilitation Services Pager [phone] Office [phone]   [facility] 01/15/2021, 10:20 AM

## 2021-01-15 NOTE — Consult Note (Signed)
Consult Note  Stephen Delgado September 17, 1965  [id number].    Requesting MD: Dr. 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 During admission, due to acute respiratory failure he has been on ventilator in the ICU with tracheostomy placement 12/13.   He has been intermittently febrile and currently on zosyn.  Due to ongoing respiratory failure and poor neurologic function patient underwent tracheostomy as above and trauma team consulted for PEG placement. Palliative team is following and have discussed GOC with family  ROS: Review of Systems  Unable to perform ROS: Critical illness   Family History  Problem Relation Age of Onset   Stroke Paternal Uncle     Past Medical History:  Diagnosis Date   Allergy    Diabetes    Hypertension     Past Surgical History:  Procedure Laterality Date   NO PAST SURGERIES      Social History:  reports that he has never smoked. He has never used smokeless tobacco. He reports current alcohol use. He reports that he does not use drugs.  Allergies: No Known Allergies  Medications Prior to Admission  Medication Sig Dispense Refill   amLODipine (NORVASC) 10 MG tablet Take 1 tablet (10 mg total) by mouth daily. 90 tablet 3   aspirin 81 MG EC tablet Take 81 mg by mouth daily. Swallow whole.     metoprolol succinate (TOPROL-XL) 25 MG 24 hr tablet TAKE 1 TABLET BY MOUTH ONCE A DAY 30 tablet 0   zolpidem (AMBIEN) 10 MG tablet TAKE 1 TABLET BY MOUTH NIGHTLY AT BEDTIME AS NEEDED FOR SLEEP (Patient not taking: Reported on 12/30/2020) 5 tablet 0    Blood pressure (!) 141/76, pulse 95, temperature 100.3 F (37.9 C), temperature source Axillary, resp. rate 18, height 6' (1.829 m), weight 85.7 kg,  SpO2 97 %. Physical Exam: General: ill appearing male who is laying in bed in NAD HEENT: head is normocephalic, atraumatic. Ears and nose without any masses or lesions.  Tracheostomy in place. Cortrak in place Heart: regular, rate, and rhythm.  Normal s1,s2. No obvious murmurs, gallops, or rubs noted.  Palpable radial and pedal pulses bilaterally Lungs: CTAB, no wheezes, rhonchi, or rales noted.  Respiratory effort unlabored on trach collar Abd: soft, ND, +BS, no masses, hernias, or organomegaly MSK: all 4 extremities are symmetrical with no cyanosis, clubbing. Mildly edematous in bilateral upper and lower extremities Skin: warm and dry with no masses, lesions, or rashes Neuro: does not follow commands Psych: A&Ox3 with an appropriate affect.    Results for orders placed or performed during the hospital encounter of 12/30/20 (from the past 48 hour(s))  Glucose, capillary     Status: Abnormal   Collection Time: 01/13/21 12:28 PM  Result Value Ref Range   Glucose-Capillary 149 (H) 70 - 99 mg/dL    Comment: Glucose reference range applies only to samples taken after fasting for at least 8 hours.  Glucose, capillary     Status: Abnormal   Collection Time: 01/13/21  3:37 PM  Result Value Ref Range   Glucose-Capillary 134 (H) 70 - 99 mg/dL    Comment: Glucose reference range applies only to samples taken after fasting for at least 8 hours.  Glucose, capillary     Status: Abnormal   Collection Time: 01/13/21  7:22 PM  Result Value Ref Range   Glucose-Capillary 124 (H) 70 - 99 mg/dL    Comment: Glucose reference range applies only to samples taken after fasting for at least 8 hours.  Glucose, capillary     Status: Abnormal   Collection Time: 01/13/21 11:08 PM  Result Value Ref Range   Glucose-Capillary 135 (H) 70 - 99 mg/dL    Comment: Glucose reference range applies only to samples taken after fasting for at least 8 hours.  Glucose, capillary     Status: Abnormal   Collection Time:  01/14/21  3:19 AM  Result Value Ref Range   Glucose-Capillary 163 (H) 70 - 99 mg/dL    Comment: Glucose reference range applies only to samples taken after fasting for at least 8 hours.  CBC with Differential/Platelet     Status: Abnormal   Collection Time: 01/14/21  3:58 AM  Result Value Ref Range   WBC 11.9 (H) 4.0 - 10.5 K/uL   RBC 3.94 (L) 4.22 - 5.81 MIL/uL   Hemoglobin 9.5 (L) 13.0 - 17.0 g/dL   HCT 27.0 (L) 62.3 - 76.2 %   MCV 74.9 (L) 80.0 - 100.0 fL   MCH 24.1 (L) 26.0 - 34.0 pg   MCHC 32.2 30.0 - 36.0 g/dL   RDW 83.1 51.7 - 61.6 %   Platelets 274 150 - 400 K/uL   nRBC 0.0 0.0 - 0.2 %   Neutrophils Relative % 85 %   Neutro Abs 10.1 (H) 1.7 - 7.7 K/uL   Lymphocytes Relative 7 %   Lymphs Abs 0.8 0.7 - 4.0 K/uL   Monocytes Relative 6 %   Monocytes Absolute 0.7 0.1 - 1.0 K/uL   Eosinophils Relative 1 %   Eosinophils Absolute 0.2 0.0 - 0.5 K/uL   Basophils Relative 0 %   Basophils Absolute 0.0 0.0 - 0.1 K/uL   Immature Granulocytes 1 %   Abs Immature Granulocytes 0.12 (H) 0.00 - 0.07 K/uL    Comment: Performed at [facility], [address], [place], [place] [address]  Basic metabolic panel     Status: Abnormal   Collection Time: 01/14/21  3:58 AM  Result Value Ref Range   Sodium 145 135 - 145 mmol/L   Potassium 3.6 3.5 - 5.1 mmol/L   Chloride 115 (H) 98 - 111 mmol/L   CO2 22 22 - 32 mmol/L   Glucose, Bld 174 (H) 70 - 99 mg/dL    Comment: Glucose reference range applies only to samples taken after fasting for at least 8 hours.   BUN 82 (H) 6 - 20 mg/dL   Creatinine, Ser 0.62 (H) 0.61 - 1.24 mg/dL   Calcium 8.0 (L) 8.9 - 10.3 mg/dL   GFR, Estimated 27 (L) >60 mL/min    Comment: (NOTE) Calculated using the CKD-EPI Creatinine Equation (2021)    Anion gap 8 5 - 15    Comment: Performed at [facility], [address], [place], [place] [address]  Hepatic function panel     Status: Abnormal   Collection Time: 01/14/21  3:58 AM  Result Value Ref Range    Total Protein 5.6 (L) 6.5 - 8.1 g/dL   Albumin 1.8 (L) 3.5 - 5.0 g/dL   AST 462 (H) 15 - 41 U/L   ALT 133 (H) 0 - 44 U/L   Alkaline Phosphatase 105 38 - 126 U/L  Total Bilirubin 0.6 0.3 - 1.2 mg/dL   Bilirubin, Direct 0.2 0.0 - 0.2 mg/dL   Indirect Bilirubin 0.4 0.3 - 0.9 mg/dL    Comment: Performed at [facility], [address] [place], [place] [address]  Glucose, capillary     Status: Abnormal   Collection Time: 01/14/21  7:51 AM  Result Value Ref Range   Glucose-Capillary 149 (H) 70 - 99 mg/dL    Comment: Glucose reference range applies only to samples taken after fasting for at least 8 hours.  Glucose, capillary     Status: Abnormal   Collection Time: 01/14/21 11:44 AM  Result Value Ref Range   Glucose-Capillary 174 (H) 70 - 99 mg/dL    Comment: Glucose reference range applies only to samples taken after fasting for at least 8 hours.  Glucose, capillary     Status: Abnormal   Collection Time: 01/14/21  4:05 PM  Result Value Ref Range   Glucose-Capillary 172 (H) 70 - 99 mg/dL    Comment: Glucose reference range applies only to samples taken after fasting for at least 8 hours.  Glucose, capillary     Status: Abnormal   Collection Time: 01/14/21  7:30 PM  Result Value Ref Range   Glucose-Capillary 153 (H) 70 - 99 mg/dL    Comment: Glucose reference range applies only to samples taken after fasting for at least 8 hours.  Culture, blood (Routine X 2) w Reflex to ID Panel     Status: None (Preliminary result)   Collection Time: 01/14/21  7:33 PM   Specimen: BLOOD LEFT HAND  Result Value Ref Range   Specimen Description BLOOD LEFT HAND    Special Requests      BOTTLES DRAWN AEROBIC ONLY Blood Culture adequate volume   Culture      NO GROWTH < 12 HOURS Performed at [facility], [address], [place], [place] [address]    Report Status PENDING   Culture, blood (Routine X 2) w Reflex to ID Panel     Status: None (Preliminary result)   Collection Time: 01/14/21   7:33 PM   Specimen: BLOOD RIGHT HAND  Result Value Ref Range   Specimen Description BLOOD RIGHT HAND    Special Requests      BOTTLES DRAWN AEROBIC ONLY Blood Culture results may not be optimal due to an inadequate volume of blood received in culture bottles   Culture      NO GROWTH < 12 HOURS Performed at [facility], [address], [place], [place] [address]    Report Status PENDING   Urinalysis, Routine w reflex microscopic Urine, Catheterized     Status: Abnormal   Collection Time: 01/14/21  9:16 PM  Result Value Ref Range   Color, Urine YELLOW YELLOW   APPearance CLEAR CLEAR   Specific Gravity, Urine 1.020 1.005 - 1.030   pH 5.5 5.0 - 8.0   Glucose, UA NEGATIVE NEGATIVE mg/dL   Hgb urine dipstick LARGE (A) NEGATIVE   Bilirubin Urine NEGATIVE NEGATIVE   Ketones, ur NEGATIVE NEGATIVE mg/dL   Protein, ur 30 (A) NEGATIVE mg/dL   Nitrite NEGATIVE NEGATIVE   Leukocytes,Ua NEGATIVE NEGATIVE    Comment: Performed at [facility], [address] [place], [place] [address]  Urinalysis, Microscopic (reflex)     Status: Abnormal   Collection Time: 01/14/21  9:16 PM  Result Value Ref Range   RBC / HPF 6-10 0 - 5 RBC/hpf  WBC, UA 0-5 0 - 5 WBC/hpf   Bacteria, UA RARE (A) NONE SEEN   Squamous Epithelial / LPF 0-5 0 - 5    Comment: Performed at [facility], [address], [place], [place] [address]  Glucose, capillary     Status: Abnormal   Collection Time: 01/14/21 11:27 PM  Result Value Ref Range   Glucose-Capillary 118 (H) 70 - 99 mg/dL    Comment: Glucose reference range applies only to samples taken after fasting for at least 8 hours.  Glucose, capillary     Status: Abnormal   Collection Time: 01/15/21  3:18 AM  Result Value Ref Range   Glucose-Capillary 167 (H) 70 - 99 mg/dL    Comment: Glucose reference range applies only to samples taken after fasting for at least 8 hours.  CBC with Differential/Platelet     Status: Abnormal   Collection Time:  01/15/21  7:06 AM  Result Value Ref Range   WBC 15.1 (H) 4.0 - 10.5 K/uL   RBC 4.16 (L) 4.22 - 5.81 MIL/uL   Hemoglobin 10.0 (L) 13.0 - 17.0 g/dL   HCT 35.3 (L) 61.4 - 43.1 %   MCV 74.3 (L) 80.0 - 100.0 fL   MCH 24.0 (L) 26.0 - 34.0 pg   MCHC 32.4 30.0 - 36.0 g/dL   RDW 54.0 08.6 - 76.1 %   Platelets 271 150 - 400 K/uL    Comment: PLATELET COUNT CONFIRMED BY SMEAR   nRBC 0.0 0.0 - 0.2 %   Neutrophils Relative % 83 %   Neutro Abs 12.6 (H) 1.7 - 7.7 K/uL   Lymphocytes Relative 7 %   Lymphs Abs 1.0 0.7 - 4.0 K/uL   Monocytes Relative 7 %   Monocytes Absolute 1.0 0.1 - 1.0 K/uL   Eosinophils Relative 2 %   Eosinophils Absolute 0.3 0.0 - 0.5 K/uL   Basophils Relative 0 %   Basophils Absolute 0.0 0.0 - 0.1 K/uL   Immature Granulocytes 1 %   Abs Immature Granulocytes 0.20 (H) 0.00 - 0.07 K/uL    Comment: Performed at [facility] [place] [facility], [address], [place], [place] [address]  Triglycerides     Status: None   Collection Time: 01/15/21  7:06 AM  Result Value Ref Range   Triglycerides 113 <150 mg/dL    Comment: Performed at [facility], [address], [place], [place] [address]  Hepatic function panel     Status: Abnormal   Collection Time: 01/15/21  7:06 AM  Result Value Ref Range   Total Protein 6.2 (L) 6.5 - 8.1 g/dL   Albumin 2.0 (L) 3.5 - 5.0 g/dL   AST 68 (H) 15 - 41 U/L   ALT 97 (H) 0 - 44 U/L   Alkaline Phosphatase 96 38 - 126 U/L   Total Bilirubin 0.6 0.3 - 1.2 mg/dL   Bilirubin, Direct 0.2 0.0 - 0.2 mg/dL   Indirect Bilirubin 0.4 0.3 - 0.9 mg/dL    Comment: Performed at [facility], [address], [place], [place] [address]  Basic metabolic panel     Status: Abnormal   Collection Time: 01/15/21  7:06 AM  Result Value Ref Range   Sodium 154 (H) 135 - 145 mmol/L   Potassium 4.8 3.5 - 5.1 mmol/L   Chloride 124 (H) 98 - 111 mmol/L   CO2 19 (L) 22 - 32 mmol/L   Glucose, Bld 168 (H) 70 - 99 mg/dL    Comment:  Glucose reference range applies only to  samples taken after fasting for at least 8 hours.   BUN 69 (H) 6 - 20 mg/dL   Creatinine, Ser 9.44 (H) 0.61 - 1.24 mg/dL   Calcium 8.4 (L) 8.9 - 10.3 mg/dL   GFR, Estimated 36 (L) >60 mL/min    Comment: (NOTE) Calculated using the CKD-EPI Creatinine Equation (2021)    Anion gap 11 5 - 15    Comment: Performed at [facility], [address], [place], [place] [address]  Glucose, capillary     Status: Abnormal   Collection Time: 01/15/21  8:10 AM  Result Value Ref Range   Glucose-Capillary 153 (H) 70 - 99 mg/dL    Comment: Glucose reference range applies only to samples taken after fasting for at least 8 hours.   DG CHEST PORT 1 VIEW  Result Date: 01/14/2021 CLINICAL DATA:  History of fever EXAM: PORTABLE CHEST 1 VIEW COMPARISON:  01/13/2021, 01/11/2021 FINDINGS: Tracheostomy tube tip about 4 cm superior to carina. Esophageal tube tip below the diaphragm but incompletely visualized. Airspace disease at left base. Stable cardiomediastinal silhouette. No pneumothorax. IMPRESSION: Airspace disease at the left lung base which may be due to atelectasis, pneumonia, or aspiration. Electronically Signed   By: Jasmine Pang M.D.   On: 01/14/2021 19:56   DG CHEST PORT 1 VIEW  Result Date: 01/13/2021 CLINICAL DATA:  Tracheostomy placement EXAM: PORTABLE CHEST 1 VIEW COMPARISON:  Chest x-ray dated January 11, 2021 FINDINGS: Tracheostomy tube place. Feeding tube partially visualized coursing below the diaphragm. Cardiac and mediastinal contours are unchanged when accounting for patient rotation. Elevation of the right hemidiaphragm similar to prior. No large pleural effusion or pneumothorax. Mild bibasilar opacities, likely due to atelectasis. IMPRESSION: Interval removal of ET tube and placement of tracheostomy tube. Electronically Signed   By: Allegra Lai M.D.   On: 01/13/2021 13:27      Assessment/Plan Acute ICH and IVH with bilateral lacunar infarcts  PEG tube placement - due to acute  illness, mental status, and nutritional needs patient has been deemed appropriate for PEG tube placement and plan has been discussed among family and palliative care who are following - called and discussed procedure including risks and benefits with patient family - wife Stephen Delgado who is Management consultant and son Stephen Delgado. They are both in agreement to proceed and all questions answered. They will be by later today to see patient - plan for bedside PEG placement tomorrow am - patient not currently on anticoagulation   Eric Form, [facility] Surgery 01/15/2021, 9:38 AM Please see Amion for pager number during day hours 7:00am-4:30pm

## 2021-01-15 NOTE — Progress Notes (Addendum)
STROKE TEAM PROGRESS NOTE   INTERVAL HISTORY Patient seen in room with no family at the bedside. He has been hemodynamically stable overnight, and his neurological exam is unchanged. He has been febrile to 101.8. Plan is for him to receive a PEG tube tomorrow.  As he remains on trach collar, he can hopefully be transferred out of the ICU soon.  Vitals:   01/15/21 1000 01/15/21 1100 01/15/21 1120 01/15/21 1200  BP: (!) 157/73 136/72 136/72 125/63  Pulse: (!) 101 (!) 103 (!) 106 (!) 107  Resp: 14 11 14  (!) 23  Temp:    (!) 102.6 F (39.2 C)  TempSrc:    Axillary  SpO2: 99% 96% 96% 99%  Weight:      Height:       CBC:  Recent Labs  Lab 01/14/21 0358 01/15/21 0706  WBC 11.9* 15.1*  NEUTROABS 10.1* 12.6*  HGB 9.5* 10.0*  HCT 29.5* 30.9*  MCV 74.9* 74.3*  PLT 274 271    Basic Metabolic Panel:  Recent Labs  Lab 01/11/21 0323 01/12/21 1301 01/15/21 0706 01/15/21 1116  NA 144   < > 154* 156*  K 4.5   < > 4.8 3.9  CL 113*   < > 124* 119*  CO2 17*   < > 19* 27  GLUCOSE 178*   < > 168* 189*  BUN 67*   < > 69* 67*  CREATININE 2.98*   < > 2.11* 2.15*  CALCIUM 8.1*   < > 8.4* 8.8*  MG 2.7*  --   --   --   PHOS 5.8*  --   --   --    < > = values in this interval not displayed.    Lipid Panel:  Recent Labs  Lab 01/15/21 0706  TRIG 113     IMAGING past 24 hours DG CHEST PORT 1 VIEW  Result Date: 01/14/2021 CLINICAL DATA:  History of fever EXAM: PORTABLE CHEST 1 VIEW COMPARISON:  01/13/2021, 01/11/2021 FINDINGS: Tracheostomy tube tip about 4 cm superior to carina. Esophageal tube tip below the diaphragm but incompletely visualized. Airspace disease at left base. Stable cardiomediastinal silhouette. No pneumothorax. IMPRESSION: Airspace disease at the left lung base which may be due to atelectasis, pneumonia, or aspiration. Electronically Signed   By: 14/12/2020 M.D.   On: 01/14/2021 19:56    PHYSICAL EXAM General:  Patient is a well-developed, well-nourished  middle-aged African-American male with a tracheostomy.  Respiratory: Patient with tracheostomy, on ventilator   Neurological:Patient does not respond to voice or sternal rub. PERRL, doll's eyes reflex present, corneal reflexes present bilaterally. Cough and gag reflexes present.  Will flicker LUE to noxious stimuli but no response in other extremities.  ASSESSMENT/PLAN Mr. LOURDES MANNING is a 55 y.o. male with history of HTN (questionable compliance with medications), DM and CKD 3 presenting with headache and altered mental status. 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  He was hypertensive on arrival to 238/160 and was given Cleviprex for BP control.  He was also agitated and required restraints.  Repeat head CT 11/30 shows unchanged IPH with IVH  Head CTs done on 12/1 and 12/3 also demonstrates unchanged IPH with IVH.  MRI/MRA completed on 12/4.patient's neurological exam continues to remain the same.  Goals of care conversation happened 12/11. Tracheostomy performed 12/13, plan for PEG tomorrow.   ICH score 2  ICH with IVH - left basal ganglia ICH  with IVH likely secondary to hypertensive emergency  bilateral lacunar infarcts - likely small vessel disease vs. Strict BP control in the setting of ICH CT head Large basal ganglia IPH with IVH involving the lateral, third and fourth ventricles with no significant mass effect or midline shift.  Repeat CT 11/30 0116 Unchanged IPH with IVH Repeat CT head 11/30 1629  Stable IPH with IVH, stable mild hydrocephalus Repeat CT Head 12/6 - Unchanged IVH and left caudate head intraparenchymal component. No new site of hemorrhage. 2D Echo EF 99991111, grade 1 diastolic dysfunction, no atrial level shunt MRI-10/3-Numerous small acute infarcts in bilateral cerebral hemispheres. Minimal involvement of the right superior cerebellum.  Left caudate hemorrhage, intraventricular hemorrhage, and scattered sulcal subarachnoid hemorrhage  as seen on recent CT imaging. Associated mild communicating hydrocephalus. Numerous chronic microhemorrhages probably secondary to hypertension. Chronic microvascular ischemic changes MRA head and neck 10/4- Mildly tortuous cervical carotid and vertebral arteries without stenosis. Intracranial hemorrhage and ventricles appears stable from the MRI yesterday. CUS unremarkable LDL 111.4 HgbA1c 6.3 VTE prophylaxis - SCDs aspirin 81 mg daily prior to admission, now on No antithrombotic secondary to [place] Therapy recommendations:  SNF Disposition:  pending  Respiratory distress S/p tracheostomy CCM on board Tachypnea- PRN fentanyl and Versed ETT exchanged 12/4 due to a cuff leak Precedex for sedation currently running. Prop is on hold  Hypertensive emergency- improving Home meds:  Amlodipine 10 mg daily, metoprolol succinate 25 mg daily,  Unstable No longer requiring Cardene to maintain BP at goal.  SBP goal <160 Currently On coreg 25mg , Clonidine 0.3->0.2->0.1 mg q8h and minoxidil 10mg  daily Long-term BP goal normotensive  Hyperlipidemia Home meds:  none LDL 111.4, goal < 70 Hold off statin for now due to acute ICH Consider statin at discharge         AKI on CKD Cre 2.98->2.45-> 2.65-> 2.11 On TF Close monitoring I/O CCM on board  Hyperglycemia CBG monitoring SSI On Levemir 20 units twice daily On NovoLog 5 units q4      Dysphagia Did not pass swallow On core track tube feeding Consulted trauma for PEG tube placement Scheduled tomorrow with Dr. Grandville Silos  Other Stroke Risk Factors Questionable compliance with medications at home        Other Active Problems Code status - full code Hypokalemia 3.1 -> 3.4 -> 3.5-> 4.5->3.6-> 3.1-> 3.6-> 3.9 Hypernatremia- 153->152->149->148-> 144->142->143-> 145-> 156  - free water 200 ml Q 2 hours  - D5W at 25 mL/hr Ileus seen on KUB- fleet enema given 12/1; bowel regimen in place Aspiration pneumonia -> Zosyn started 12/15 per CCM -  temp 101.8- WBCs  15.1 Anemia - Hb 10.0  Hospital day # 16  Patient seen and examined by NP/APP with MD. MD to update note as needed.   [place] , MSN, AGACNP-BC [facility] See Amion for schedule and pager information 01/15/2021 12:55 PM  ATTENDING NOTE: I reviewed above note and agree with the assessment and plan. Pt was seen and examined.   RN at bedside, no family present.  Patient on trach collar today, seems tolerating well.  However still unresponsive, mild withdraw on the left upper extremity with painful stimuli, neuro unchanged.  Persistent fever, discussed with CCM Dr. Tacy Learn, will start Zosyn.  AKI improving, however significant hypernatremia, put on D5W on top of free water.  Also discussed with Dr. Grandville Silos, surgery, will schedule PEG tube tomorrow.  For detailed assessment and plan, please refer to above as I have  made changes wherever appropriate.   This patient is critically ill due to [place], IVH, multifocal ischemic infarct, hypertensive emergency, persistent fever, status post tracheostomy, hypernatremia, dysphagia, AKI and at significant risk of neurological worsening, death form brain herniation, encephalopathy, sepsis, renal failure, seizure. This patient's care requires constant monitoring of vital signs, hemodynamics, respiratory and cardiac monitoring, review of multiple databases, neurological assessment, discussion with family, other specialists and medical decision making of high complexity. I spent 40 minutes of neurocritical care time in the care of this patient.  I discussed with CCM Dr. Tacy Learn and trauma surgery Dr. Grandville Silos.   Rosalin Hawking, MD PhD Stroke Neurology 01/15/2021 6:12 PM    To contact Stroke Continuity provider, please refer to [email or web address] After hours, contact General Neurology

## 2021-01-15 NOTE — Progress Notes (Signed)
eLink Physician-Brief Progress Note Patient Name: Stephen Delgado DOB: [birth year] MRN: [id number]   Date of Service  01/15/2021  HPI/Events of Note  Patient is for AM PEG tube placement.  eICU Interventions  Nursing communication order entered to hold 6 AM Heparin dose for PEG tube placement.        Thomasene Lot Rowena Moilanen 01/15/2021, 10:38 PM

## 2021-01-16 ENCOUNTER — Inpatient Hospital Stay (HOSPITAL_COMMUNITY): Payer: 59

## 2021-01-16 ENCOUNTER — Encounter (HOSPITAL_COMMUNITY)
Admission: EM | Disposition: A | Discharge status: Home Health | Payer: Self-pay | Source: Home / Self Care | Attending: Neurology

## 2021-01-16 DIAGNOSIS — Z430 Encounter for attention to tracheostomy: Secondary | ICD-10-CM

## 2021-01-16 DIAGNOSIS — I161 Hypertensive emergency: Secondary | ICD-10-CM | POA: Diagnosis not present

## 2021-01-16 DIAGNOSIS — I6340 Cerebral infarction due to embolism of unspecified cerebral artery: Secondary | ICD-10-CM | POA: Diagnosis not present

## 2021-01-16 DIAGNOSIS — J9601 Acute respiratory failure with hypoxia: Secondary | ICD-10-CM | POA: Diagnosis not present

## 2021-01-16 DIAGNOSIS — I610 Nontraumatic intracerebral hemorrhage in hemisphere, subcortical: Secondary | ICD-10-CM | POA: Diagnosis not present

## 2021-01-16 HISTORY — PX: ESOPHAGOGASTRODUODENOSCOPY: SHX5428

## 2021-01-16 HISTORY — PX: PEG PLACEMENT: SHX5437

## 2021-01-16 LAB — HEPATIC FUNCTION PANEL
ALT: 76 U/L — ABNORMAL HIGH (ref 0–44)
AST: 58 U/L — ABNORMAL HIGH (ref 15–41)
Albumin: 2 g/dL — ABNORMAL LOW (ref 3.5–5.0)
Alkaline Phosphatase: 89 U/L (ref 38–126)
Bilirubin, Direct: 0.2 mg/dL (ref 0.0–0.2)
Indirect Bilirubin: 0.4 mg/dL (ref 0.3–0.9)
Total Bilirubin: 0.6 mg/dL (ref 0.3–1.2)
Total Protein: 6.4 g/dL — ABNORMAL LOW (ref 6.5–8.1)

## 2021-01-16 LAB — POCT I-STAT 7, (LYTES, BLD GAS, ICA,H+H)
Acid-Base Excess: 5 mmol/L — ABNORMAL HIGH (ref 0.0–2.0)
Bicarbonate: 28.2 mmol/L — ABNORMAL HIGH (ref 20.0–28.0)
Calcium, Ion: 1.27 mmol/L (ref 1.15–1.40)
HCT: 28 % — ABNORMAL LOW (ref 39.0–52.0)
Hemoglobin: 9.5 g/dL — ABNORMAL LOW (ref 13.0–17.0)
O2 Saturation: 99 %
Patient temperature: 99.4
Potassium: 3.7 mmol/L (ref 3.5–5.1)
Sodium: 159 mmol/L — ABNORMAL HIGH (ref 135–145)
TCO2: 29 mmol/L (ref 22–32)
pCO2 arterial: 36.2 mmHg (ref 32.0–48.0)
pH, Arterial: 7.501 — ABNORMAL HIGH (ref 7.350–7.450)
pO2, Arterial: 110 mmHg — ABNORMAL HIGH (ref 83.0–108.0)

## 2021-01-16 LAB — GLUCOSE, CAPILLARY
Glucose-Capillary: 103 mg/dL — ABNORMAL HIGH (ref 70–99)
Glucose-Capillary: 106 mg/dL — ABNORMAL HIGH (ref 70–99)
Glucose-Capillary: 140 mg/dL — ABNORMAL HIGH (ref 70–99)
Glucose-Capillary: 86 mg/dL (ref 70–99)
Glucose-Capillary: 90 mg/dL (ref 70–99)
Glucose-Capillary: 91 mg/dL (ref 70–99)

## 2021-01-16 LAB — CBC WITH DIFFERENTIAL/PLATELET
Abs Immature Granulocytes: 0.11 10*3/uL — ABNORMAL HIGH (ref 0.00–0.07)
Basophils Absolute: 0 10*3/uL (ref 0.0–0.1)
Basophils Relative: 0 %
Eosinophils Absolute: 0.3 10*3/uL (ref 0.0–0.5)
Eosinophils Relative: 2 %
HCT: 32.6 % — ABNORMAL LOW (ref 39.0–52.0)
Hemoglobin: 10.1 g/dL — ABNORMAL LOW (ref 13.0–17.0)
Immature Granulocytes: 1 %
Lymphocytes Relative: 7 %
Lymphs Abs: 0.9 10*3/uL (ref 0.7–4.0)
MCH: 23.8 pg — ABNORMAL LOW (ref 26.0–34.0)
MCHC: 31 g/dL (ref 30.0–36.0)
MCV: 76.7 fL — ABNORMAL LOW (ref 80.0–100.0)
Monocytes Absolute: 0.9 10*3/uL (ref 0.1–1.0)
Monocytes Relative: 7 %
Neutro Abs: 10.4 10*3/uL — ABNORMAL HIGH (ref 1.7–7.7)
Neutrophils Relative %: 83 %
Platelets: 363 10*3/uL (ref 150–400)
RBC: 4.25 MIL/uL (ref 4.22–5.81)
RDW: 15.5 % (ref 11.5–15.5)
WBC: 12.6 10*3/uL — ABNORMAL HIGH (ref 4.0–10.5)
nRBC: 0 % (ref 0.0–0.2)

## 2021-01-16 LAB — BASIC METABOLIC PANEL
Anion gap: 12 (ref 5–15)
BUN: 62 mg/dL — ABNORMAL HIGH (ref 6–20)
CO2: 25 mmol/L (ref 22–32)
Calcium: 8.8 mg/dL — ABNORMAL LOW (ref 8.9–10.3)
Chloride: 118 mmol/L — ABNORMAL HIGH (ref 98–111)
Creatinine, Ser: 2.03 mg/dL — ABNORMAL HIGH (ref 0.61–1.24)
GFR, Estimated: 38 mL/min — ABNORMAL LOW (ref >60)
Glucose, Bld: 122 mg/dL — ABNORMAL HIGH (ref 70–99)
Potassium: 3.9 mmol/L (ref 3.5–5.1)
Sodium: 155 mmol/L — ABNORMAL HIGH (ref 135–145)

## 2021-01-16 SURGERY — INSERTION, PEG TUBE
Anesthesia: LOCAL

## 2021-01-16 MED ORDER — PROSOURCE TF PO LIQD
45.0000 mL | Freq: Every day | ORAL | Status: DC
  Administered 2021-01-17 – 2021-02-02 (×17): 45 mL
  Filled 2021-01-16 (×17): qty 45

## 2021-01-16 MED ORDER — GLUCERNA 1.2 CAL PO LIQD
1000.0000 mL | ORAL | Status: DC
  Administered 2021-01-16 – 2021-02-02 (×17): 1000 mL
  Filled 2021-01-16 (×37): qty 1000

## 2021-01-16 MED ORDER — FENTANYL CITRATE PF 50 MCG/ML IJ SOSY
50.0000 ug | PREFILLED_SYRINGE | Freq: Once | INTRAMUSCULAR | Status: AC
  Administered 2021-01-16: 50 ug via INTRAVENOUS
  Filled 2021-01-16: qty 1

## 2021-01-16 MED ORDER — FENTANYL CITRATE PF 50 MCG/ML IJ SOSY
50.0000 ug | PREFILLED_SYRINGE | Freq: Once | INTRAMUSCULAR | Status: AC
  Administered 2021-01-16: 50 ug via INTRAVENOUS

## 2021-01-16 MED ORDER — BETHANECHOL CHLORIDE 10 MG PO TABS
25.0000 mg | ORAL_TABLET | Freq: Three times a day (TID) | ORAL | Status: DC
  Administered 2021-01-16 – 2021-02-28 (×129): 25 mg
  Filled 2021-01-16 (×11): qty 3
  Filled 2021-01-16: qty 1
  Filled 2021-01-16 (×4): qty 3
  Filled 2021-01-16: qty 1
  Filled 2021-01-16 (×8): qty 3
  Filled 2021-01-16: qty 1
  Filled 2021-01-16 (×14): qty 3
  Filled 2021-01-16: qty 1
  Filled 2021-01-16: qty 3
  Filled 2021-01-16: qty 1
  Filled 2021-01-16 (×2): qty 3
  Filled 2021-01-16: qty 1
  Filled 2021-01-16 (×3): qty 3
  Filled 2021-01-16: qty 1
  Filled 2021-01-16 (×6): qty 3
  Filled 2021-01-16: qty 1
  Filled 2021-01-16 (×35): qty 3
  Filled 2021-01-16: qty 1
  Filled 2021-01-16 (×13): qty 3
  Filled 2021-01-16: qty 1
  Filled 2021-01-16 (×24): qty 3

## 2021-01-16 MED ORDER — ROCURONIUM BROMIDE 10 MG/ML (PF) SYRINGE
100.0000 mg | PREFILLED_SYRINGE | Freq: Once | INTRAVENOUS | Status: AC
  Administered 2021-01-16: 100 mg via INTRAVENOUS
  Filled 2021-01-16: qty 10

## 2021-01-16 MED ORDER — MIDAZOLAM HCL 2 MG/2ML IJ SOLN
2.0000 mg | Freq: Once | INTRAMUSCULAR | Status: AC
  Administered 2021-01-16: 2 mg via INTRAVENOUS
  Filled 2021-01-16: qty 2

## 2021-01-16 NOTE — Op Note (Signed)
[facility] Patient Name: Stephen Delgado Procedure Date : 01/16/2021 MRN: [id number] Attending MD: Violeta Gelinas , MD Date of Birth: 05/06/1965 CSN: [id number] Age: 55 Admit Type: Inpatient Procedure:                Upper GI endoscopy Indications:              Place PEG because patient is unable to eat due to                            stroke (CVA) Providers:                Violeta Gelinas, MD, Carl Best, PA-C; Michele Mcalpine Technician, Alyce Pagan Technician,                            Technician Referring MD:              Medicines:                Fentanyl 100 micrograms IV, Midazolam 2 mg IV Complications:            No immediate complications. Estimated Blood Loss:     Estimated blood loss was minimal. Procedure:                Pre-Anesthesia Assessment:                           - Prior to the procedure, a History and Physical                            was performed, and patient medications and                            allergies were reviewed. The patient is unable to                            give consent secondary to the patient's altered                            mental status. The risks and benefits of the                            procedure and the sedation options and risks were                            discussed with the patient's spouse. All questions                            were answered and informed consent was obtained.                            Patient identification and proposed procedure were  verified by the physician, the nurse and the                            technician in the procedure room. Mental Status                            Examination: lethargic. ASA Grade Assessment: III -                            A patient with severe systemic disease. After                            reviewing the risks and benefits, the patient was                            deemed in  satisfactory condition to undergo the                            procedure. The anesthesia plan was to use deep                            sedation / analgesia. Immediately prior to                            administration of medications, the patient was                            re-assessed for adequacy to receive sedatives. The                            heart rate, respiratory rate, oxygen saturations,                            blood pressure, adequacy of pulmonary ventilation,                            and response to care were monitored throughout the                            procedure. The physical status of the patient was                            re-assessed after the procedure.                           After obtaining informed consent, the endoscope was                            passed under direct vision. Throughout the                            procedure, the patient's blood pressure, pulse, and  oxygen saturations were monitored continuously. The                            [id number]) Olympus endoscope was introduced                            through the mouth, and advanced to the duodenal                            bulb. The upper GI endoscopy was accomplished                            without difficulty. The patient tolerated the                            procedure well. Scope In: Scope Out: Findings:      No gross lesions were noted in the esophagus.      No gross lesions were noted in the stomach. Estimated blood loss was       minimal. Placement of an externally removable PEG with no T-fasteners       was successfully completed. The external bumper was at the 4.0 cm       marking on the tube.      No gross lesions were noted in the duodenal bulb. Impression:               - No gross lesions in esophagus.                           - No gross lesions in the stomach.                           - An externally removable PEG  placement was                            successfully completed.                           - No specimens collected. Recommendation:           meds now, TF restart in 4h, binder at all tiimes to                            protect PEG Procedure Code(s):        --- Professional ---                           [id number], Esophagogastroduodenoscopy, flexible,                            transoral; with directed placement of percutaneous                            gastrostomy tube Diagnosis Code(s):        --- Professional ---  [id number], Other sequelae of cerebral infarction                           R63.3, Feeding difficulties                           Z43.1, Encounter for attention to gastrostomy CPT copyright 2019 [employer]. All rights reserved. The codes documented in this report are preliminary and upon coder review may  be revised to meet current compliance requirements. Georganna Skeans, MD 01/16/2021 2:08:02 PM This report has been signed electronically. Number of Addenda: 0

## 2021-01-16 NOTE — TOC Progression Note (Signed)
Transition of Care [facility]) - Progression Note    Patient Details  Name: Stephen Delgado MRN: [id number] Date of Birth: 1965-05-07  [facility] CM/SW Contact  Glennon Mac, RN Phone Number: 01/16/2021, 4:42 PM  Clinical Narrative:    Patient denied by both area LTACs.  [facility] is not in network with pt's insurance.  [facility] is in network, but denies due to lack of progression.  Pt will ultimately need SNF placement.    Expected Discharge Plan: Skilled Nursing Facility Barriers to Discharge: Continued Medical Work up  Expected Discharge Plan and Services Expected Discharge Plan: Skilled Nursing Facility   Discharge Planning Services: CM Consult   Living arrangements for the past 2 months: Single Family Home                                       Social Determinants of Health (SDOH) Interventions    Readmission Risk Interventions No flowsheet data found.  Quintella Baton, RN, BSN  Trauma/Neuro ICU Case Manager [phone]

## 2021-01-16 NOTE — Progress Notes (Signed)
Tube feeds and all per tube meds held since midnight. 0600 heparin also held. Spoke with Dr Janee Morn, plan for bedside PEG around 1300.   Sherral Hammers, RN

## 2021-01-16 NOTE — Progress Notes (Signed)
eLink Physician-Brief Progress Note Patient Name: Stephen Delgado DOB: November 16, 1965 MRN: [id number]   Date of Service  01/16/2021  HPI/Events of Note  Serum Sodium check.  eICU Interventions  Na +  154 (improved)        Vernis [place]ungan U Erin Uecker 01/16/2021, 1:48 AM

## 2021-01-16 NOTE — Progress Notes (Addendum)
STROKE TEAM PROGRESS NOTE   INTERVAL HISTORY Patient seen in room with no family at the bedside.  He has been hemodynamically stable.  He did have some low blood pressures and his clonidine was reduced.  Taper clonodine off if he remains hypotensive.  Afebrile overnight. His neurological exam remains unchanged without improvement overtime so we will plan to get an MRI repeat today.  He is still on the trach collar and plan for PEG tube today.  Vitals:   01/16/21 0737 01/16/21 0800 01/16/21 0801 01/16/21 0900  BP: 127/74 127/71  138/76  Pulse: 94 90  91  Resp: (!) 24 19  13   Temp:   99.4 F (37.4 C)   TempSrc:   Axillary   SpO2: 98% 99%  100%  Weight:      Height:       CBC:  Recent Labs  Lab 01/15/21 0706 01/16/21 0402  WBC 15.1* 12.6*  NEUTROABS 12.6* 10.4*  HGB 10.0* 10.1*  HCT 30.9* 32.6*  MCV 74.3* 76.7*  PLT 271 [id number]    Basic Metabolic Panel:  Recent Labs  Lab 01/11/21 0323 01/12/21 1301 01/15/21 1116 01/15/21 2258 01/16/21 0402  NA 144   < > 156* 154* 155*  K 4.5   < > 3.9  --  3.9  CL 113*   < > 119*  --  118*  CO2 17*   < > 27  --  25  GLUCOSE 178*   < > 189*  --  122*  BUN 67*   < > 67*  --  62*  CREATININE 2.98*   < > 2.15*  --  2.03*  CALCIUM 8.1*   < > 8.8*  --  8.8*  MG 2.7*  --   --   --   --   PHOS 5.8*  --   --   --   --    < > = values in this interval not displayed.    Lipid Panel:  Recent Labs  Lab 01/15/21 0706  TRIG 113     IMAGING past 24 hours No results found.  PHYSICAL EXAM General:  Patient is a well-developed, well-nourished middle-aged African-American male with a tracheostomy.  Respiratory: Patient with tracheostomy, on ventilator   Neurological:Patient does not respond to voice or sternal rub. PERRL, doll's eyes reflex present, corneal reflexes present bilaterally but weak. Cough and gag reflexes present.  Will flicker LUE to noxious stimuli but no response in other extremities.   ASSESSMENT/PLAN Mr. ARYN HAFFEY is  a 55 y.o. male with history of HTN (questionable compliance with medications), DM and CKD 3 presenting with headache and altered mental status. 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  He was hypertensive on arrival to 238/160 and was given Cleviprex for BP control.  He was also agitated and required restraints.  Repeat head CT 11/30 shows unchanged IPH with IVH  Head CTs done on 12/1 and 12/3 also demonstrates unchanged IPH with IVH.  MRI/MRA completed on 12/4.patient's neurological exam continues to remain the same.  Goals of care conversation happened 12/11. Tracheostomy performed 12/13.  PEG tube placed 12/16.  Repeat MRI 12/16.  ICH score 2  ICH with IVH - left basal ganglia ICH with IVH likely secondary to hypertensive emergency  bilateral lacunar infarcts - likely small vessel disease vs. Strict BP control in the setting of ICH CT head Large basal ganglia IPH with IVH involving the  lateral, third and fourth ventricles with no significant mass effect or midline shift.  Repeat CT 11/30 0116 Unchanged IPH with IVH Repeat CT head 11/30 1629  Stable IPH with IVH, stable mild hydrocephalus Repeat CT Head 12/6 - Unchanged IVH and left caudate head intraparenchymal component. No new site of hemorrhage. 2D Echo EF 50-55%, grade 1 diastolic dysfunction, no atrial level shunt MRI-10/3-Numerous small acute infarcts in bilateral cerebral hemispheres. Minimal involvement of the right superior cerebellum.  Left caudate hemorrhage, intraventricular hemorrhage, and scattered sulcal subarachnoid hemorrhage as seen on recent CT imaging. Associated mild communicating hydrocephalus. Numerous chronic microhemorrhages probably secondary to hypertension. Chronic microvascular ischemic changes MRA head and neck 10/4- Mildly tortuous cervical carotid and vertebral arteries without stenosis. Intracranial hemorrhage and ventricles appears stable from the MRI yesterday. CUS  unremarkable LDL 111.4 HgbA1c 6.3 VTE prophylaxis - SCDs aspirin 81 mg daily prior to admission, now on No antithrombotic secondary to IPH Therapy recommendations:  SNF Disposition:  pending  Respiratory distress S/p tracheostomy CCM on board Tachypnea- PRN fentanyl and Versed ETT exchanged 12/4 due to a cuff leak Precedex for sedation currently running. Prop is on hold  Hypertensive emergency- improving Home meds:  Amlodipine 10 mg daily, metoprolol succinate 25 mg daily,  Unstable No longer requiring Cardene to maintain BP at goal.  SBP goal <160 Currently On coreg 25mg , Clonidine 0.3->0.2->0.1 mg q8h and minoxidil 10mg  daily Long-term BP goal normotensive  Hyperlipidemia Home meds:  none LDL 111.4, goal < 70 Hold off statin for now due to acute ICH Consider statin at discharge         AKI on CKD Cre 2.98->2.45-> 2.65-> 2.11-> 2.15 On TF Close monitoring I/O CCM on board  Hyperglycemia CBG monitoring SSI On Levemir 20 units twice daily On NovoLog 5 units q4      Dysphagia Did not pass swallow Coretrak in place with tube feed PEG tube placement 12/16  Other Stroke Risk Factors Questionable compliance with medications at home        Other Active Problems Code status - full code Hypokalemia 3.1 -> 3.4 -> 3.5-> 4.5->3.6-> 3.1-> 3.6-> 3.9->3.7 Resolved Hypernatremia- 153->152->149->148-> 144->142->143-> 145-> 156->154  - free water 200 ml Q 2 hours  - D5W at 25 mL/hr Ileus seen on KUB- fleet enema given 12/1; bowel regimen in place Aspiration pneumonia  Zosyn started 12/15 per CCM  Now afebrile WBCs  15.1->12.6 Anemia - Hb 10.1  Hospital day # 17  Patient seen and examined by NP/APP with MD. MD to update note as needed.   14/1, DNP, FNP-BC [facility] Pager: [phone]  ATTENDING NOTE: I reviewed above note and agree with the assessment and plan. Pt was seen and examined.   RN at bedside.  Patient on trach collar, continue  to have fever T-max yesterday 102.6 and overnight 100.9.  Currently on Zosyn.  Creatinine stable, leukocytosis slightly improved.  However patient still lack of neurologically improvement, still unresponsive, not moving extremities.  Slight movement of left upper extremity on hand stimulation.  Given no improvement of neuro exam over time, will repeat MRI.  Continue current management, appreciate CCM assistance.  For detailed assessment and plan, please refer to above as I have made changes wherever appropriate.   Elmer Picker, MD PhD Stroke Neurology 01/16/2021 4:51 PM    To contact Stroke Continuity provider, please refer to Marvel Plan. After hours, contact General Neurology

## 2021-01-16 NOTE — Progress Notes (Signed)
NAME:  Stephen Delgado, MRN:  [id number], DOB:  March 07, 1965, LOS: 71 ADMISSION DATE:  12/30/2020, CONSULTATION DATE:  01/01/21 REFERRING MD:  xu (stroke), CHIEF COMPLAINT:  respiratory failure   History of Present Illness:  55 yo male presented with headache, nausea and altered mental status.  CT head showed acute Lt ICH with IVH likely related to hypertensive emergency (BP 195/116).  Had persistent hypertension and concern for airway protection, and PCCM asked to assist with ICU management.  Pertinent  Medical History  HTN, DM type 2, CKD 3a, OSA  Significant Hospital Events:  11/29 Admit, start cleviprex 12/01 PCCM consulted; changed from cleviprex to cardene due to elevated triglycerides 12/03 intubated; episode of vomiting >> tube feeds held 12/04 resume trickle tube feeds 12/06 remains minimally responsive. Tolerating SBT.  12/12 no acute events overnight, T-max 102 point 12/13 bedside trach planned midmorning  12/14 ATC. Lasix --> 5L UOP  12/15 cont on trach collar. Na to 154 from 145. Adding low rate d5  12/16 scheduled for PEG. Cont D5. Continues on trach collar   Studies:  CT head 11/29 >> large amount of IVH involving lateral/3rd/4th ventricles, ICH in Lt BG, chronic microvascular ischemic changes Echo 11/30 >> EF 50 to 55%, mod LVH, grade 1 DD, ascending aorta 37 mm EEG 12/01 >> continuous generalized slowing MRI brain 12/03 >> numerous small acute infarcts in b/l cerebral hemispheres, Lt caudate hemorrhage, IVH, scattered sulcal SAH, mild communicating hydrocephalus 12/5 Started on free water for hypernatremia. Cefazolin for MSSA pneumonia.  Interim History / Subjective:  Na stable at 155 after adding D5 27ml/hr   Objective   Blood pressure 127/74, pulse 94, temperature 99.4 F (37.4 C), temperature source Axillary, resp. rate (!) 24, height 6' (1.829 m), weight 86.8 kg, SpO2 98 %.    FiO2 (%):  [28 %] 28 %   Intake/Output Summary (Last 24 hours) at 01/16/2021  0818 Last data filed at 01/16/2021 0700 Gross per 24 hour  Intake 1922.05 ml  Output 4185 ml  Net -2262.95 ml   Filed Weights   01/14/21 0500 01/15/21 0331 01/16/21 0407  Weight: 90.6 kg 85.7 kg 86.8 kg    Examination: General: Critically ill appearing middle aged M trach, supine in bed NAD  HEENT: NCAT. Trach secure anicteric sclera pink mmm  Neuro:. Pupils equal round reactive. slight decerebrate posturing to painful stimuli  CV: rr s1s2 no rgm cap refill brisk 2+ radial pulses  PULM: diminished bibasilar sounds. CTAb, symmetrical chest expansion without accessory muscle use on ATC  GI: soft ndnt + bowel sounds. Fecal collection device  GU: external urinary pouch  Extremities: no acute joint deformity. No cyanosis or clubbing. Non-pitting dependent edema Skin: c/d/w no rash   Assessment & Plan:   Acute ICH with IVH Bilateral lacunar infarcts  -Left basal ganglia ICH and IVH likely secondary to hypertensive emergency -MRI 12/3 numerous small acute infarcts in bilateral cerebral hemispheres P: Per neuro   Acute respiratory failure with hypoxia -due to intracranial process, staph PNA sp abx course  S/p tracheostomy LLL ASD  P: Cont trach collar Routine trach care Zosyn for possible LLL PNA   Fever  -central vs infectious.  LLL ASD -- possible aspiration  P: Follow trach aspirate -- so far no orgs  Follow Bcx  Empiric zosyn -- if nothing grows out, short course  PRN APAP, trend WBC fever curve   HTN -hypertensive emergency on admission  P: Cont tele Cont enteral reg If volume status  optimization allows weaning of oral reg, would favor decr/ dc clonidine first due to sedating side effects   AKI  Hypernatremia -had a pretty rapid incr in Na 12/15 -- from 145 to 154 after diuresing 5L, receiving albumin. D5 was started, still got more lasix. 12/16 is stable at 155 but still elevated  P: Hold diuresis Cont D5 at 48ml/hr -- recheck afternoon Na AM BMP   DM  with hyperglycemia -anticipate incr hyperglycemia in setting of d5 gtt for hyperNa P: -will incr coverage to rSSI in light of adding low rate d5 for hypernatremia   Elevated transaminases (mild) improving P: PRN LFTs  Protein calorie malnutrition P: EN per PRN  Pending PEG placement   Urinary retention  P: Incr urecholine from 10mg  TID to 25mg  TID  On void trial but has required I/O x 3 -- nearing need for foley replacement   Goals of care Palliative has seen and s/o Full code, aggressive care S/p trach. Pending PEG  Cont ATC efforts and dispo planning    Best Practice (right click and "Reselect all SmartList Selections" daily)   Diet/type: tubefeeds  DVT prophylaxis: prophylactic heparin  GI prophylaxis: PPI Lines: N/A Foley:  n/a  Code Status:  full code Family: 12/16 -- ccm and sons at bedside. Palliative has been engaged and family has wished for trach/peg / LTAC dispo  Dispo: In ICU 12/16 preceding PEG -- will eval ability to transfer out of ICU to progressive following PEG procedure    CCT: n/a     1/17 MSN, AGACNP-BC [facility] Pulmonary/Critical Care Medicine Amion for pager  01/16/2021, 8:18 AM

## 2021-01-16 NOTE — Progress Notes (Signed)
Patient ID: Stephen Delgado, male   DOB: [birth year], 55 y.o.   MRN: [id number]     Subjective: Non-verbal ROS negative except as listed above. Objective: Vital signs in last 24 hours: Temp:  [99.4 F (37.4 C)-102.6 F (39.2 C)] 99.4 F (37.4 C) (12/16 0801) Pulse Rate:  [88-107] 94 (12/16 0737) Resp:  [5-27] 24 (12/16 0737) BP: (100-157)/(50-75) 127/74 (12/16 0737) SpO2:  [96 %-100 %] 98 % (12/16 0737) FiO2 (%):  [28 %] 28 % (12/16 0737) Weight:  [86.8 kg] 86.8 kg (12/16 0407) Last BM Date: 01/14/21  Intake/Output from previous day: 12/15 0701 - 12/16 0700 In: 2062.1 [I.V.:698.8; NG/GT:1190; IV Piggyback:173.3] Out: 4185 [Urine:4165; Stool:20] Intake/Output this shift: No intake/output data recorded.  General appearance: no distress Neck: trach Resp: clear to auscultation bilaterally Cardio: regular rate and rhythm GI: soft, non-tender; bowel sounds normal; no masses,  no organomegaly  Lab Results: CBC  Recent Labs    01/15/21 0706 01/16/21 0402  WBC 15.1* 12.6*  HGB 10.0* 10.1*  HCT 30.9* 32.6*  PLT 271 363   BMET Recent Labs    01/15/21 1116 01/15/21 2258 01/16/21 0402  NA 156* 154* 155*  K 3.9  --  3.9  CL 119*  --  118*  CO2 27  --  25  GLUCOSE 189*  --  122*  BUN 67*  --  62*  CREATININE 2.15*  --  2.03*  CALCIUM 8.8*  --  8.8*   PT/INR No results for input(s): LABPROT, INR in the last 72 hours. ABG No results for input(s): PHART, HCO3 in the last 72 hours.  Invalid input(s): PCO2, PO2  Studies/Results: DG CHEST PORT 1 VIEW  Result Date: 01/14/2021 CLINICAL DATA:  History of fever EXAM: PORTABLE CHEST 1 VIEW COMPARISON:  01/13/2021, 01/11/2021 FINDINGS: Tracheostomy tube tip about 4 cm superior to carina. Esophageal tube tip below the diaphragm but incompletely visualized. Airspace disease at left base. Stable cardiomediastinal silhouette. No pneumothorax. IMPRESSION: Airspace disease at the left lung base which may be due to atelectasis,  pneumonia, or aspiration. Electronically Signed   By: Jasmine Pang M.D.   On: 01/14/2021 19:56    Anti-infectives: Anti-infectives (From admission, onward)    Start     Dose/Rate Route Frequency Ordered Stop   01/15/21 1630  piperacillin-tazobactam (ZOSYN) IVPB 3.375 g        3.375 g 12.5 mL/hr over 240 Minutes Intravenous Every 8 hours 01/15/21 0943     01/15/21 1030  piperacillin-tazobactam (ZOSYN) IVPB 3.375 g        3.375 g 100 mL/hr over 30 Minutes Intravenous  Once 01/15/21 0943 01/15/21 1035   01/05/21 1600  ceFAZolin (ANCEF) IVPB 2g/100 mL premix        2 g 200 mL/hr over 30 Minutes Intravenous Every 8 hours 01/05/21 0853 01/10/21 0848   01/03/21 0830  piperacillin-tazobactam (ZOSYN) IVPB 3.375 g  Status:  Discontinued        3.375 g 12.5 mL/hr over 240 Minutes Intravenous Every 8 hours 01/03/21 0800 01/05/21 6789       Assessment/Plan: CVA and need for enteral access - for PEG at bedside today. The procedure, risks, and benefits have been discussed and his wife has signed consent.   LOS: 17 days    Violeta Gelinas, MD, MPH, FACS Trauma & General Surgery Use [email or web address] to contact on call provider  01/16/2021

## 2021-01-17 DIAGNOSIS — N179 Acute kidney failure, unspecified: Secondary | ICD-10-CM | POA: Diagnosis not present

## 2021-01-17 DIAGNOSIS — I619 Nontraumatic intracerebral hemorrhage, unspecified: Secondary | ICD-10-CM | POA: Diagnosis not present

## 2021-01-17 DIAGNOSIS — J9601 Acute respiratory failure with hypoxia: Secondary | ICD-10-CM | POA: Diagnosis not present

## 2021-01-17 DIAGNOSIS — E870 Hyperosmolality and hypernatremia: Secondary | ICD-10-CM | POA: Diagnosis not present

## 2021-01-17 DIAGNOSIS — I610 Nontraumatic intracerebral hemorrhage in hemisphere, subcortical: Secondary | ICD-10-CM | POA: Diagnosis not present

## 2021-01-17 LAB — CBC WITH DIFFERENTIAL/PLATELET
Abs Immature Granulocytes: 0.07 10*3/uL (ref 0.00–0.07)
Basophils Absolute: 0 10*3/uL (ref 0.0–0.1)
Basophils Relative: 0 %
Eosinophils Absolute: 0.3 10*3/uL (ref 0.0–0.5)
Eosinophils Relative: 2 %
HCT: 33 % — ABNORMAL LOW (ref 39.0–52.0)
Hemoglobin: 10.1 g/dL — ABNORMAL LOW (ref 13.0–17.0)
Immature Granulocytes: 1 %
Lymphocytes Relative: 11 %
Lymphs Abs: 1.2 10*3/uL (ref 0.7–4.0)
MCH: 23.6 pg — ABNORMAL LOW (ref 26.0–34.0)
MCHC: 30.6 g/dL (ref 30.0–36.0)
MCV: 77.1 fL — ABNORMAL LOW (ref 80.0–100.0)
Monocytes Absolute: 0.5 10*3/uL (ref 0.1–1.0)
Monocytes Relative: 5 %
Neutro Abs: 8.9 10*3/uL — ABNORMAL HIGH (ref 1.7–7.7)
Neutrophils Relative %: 81 %
Platelets: 355 10*3/uL (ref 150–400)
RBC: 4.28 MIL/uL (ref 4.22–5.81)
RDW: 15.8 % — ABNORMAL HIGH (ref 11.5–15.5)
WBC: 10.9 10*3/uL — ABNORMAL HIGH (ref 4.0–10.5)
nRBC: 0 % (ref 0.0–0.2)

## 2021-01-17 LAB — BASIC METABOLIC PANEL
Anion gap: 12 (ref 5–15)
BUN: 61 mg/dL — ABNORMAL HIGH (ref 6–20)
CO2: 23 mmol/L (ref 22–32)
Calcium: 8.8 mg/dL — ABNORMAL LOW (ref 8.9–10.3)
Chloride: 121 mmol/L — ABNORMAL HIGH (ref 98–111)
Creatinine, Ser: 2.02 mg/dL — ABNORMAL HIGH (ref 0.61–1.24)
GFR, Estimated: 38 mL/min — ABNORMAL LOW (ref >60)
Glucose, Bld: 129 mg/dL — ABNORMAL HIGH (ref 70–99)
Potassium: 5 mmol/L (ref 3.5–5.1)
Sodium: 156 mmol/L — ABNORMAL HIGH (ref 135–145)

## 2021-01-17 LAB — GLUCOSE, CAPILLARY
Glucose-Capillary: 112 mg/dL — ABNORMAL HIGH (ref 70–99)
Glucose-Capillary: 154 mg/dL — ABNORMAL HIGH (ref 70–99)
Glucose-Capillary: 150 mg/dL — ABNORMAL HIGH (ref 70–99)
Glucose-Capillary: 133 mg/dL — ABNORMAL HIGH (ref 70–99)
Glucose-Capillary: 139 mg/dL — ABNORMAL HIGH (ref 70–99)

## 2021-01-17 MED ORDER — CHLORHEXIDINE GLUCONATE 0.12 % MT SOLN
15.0000 mL | Freq: Two times a day (BID) | OROMUCOSAL | Status: DC
  Administered 2021-01-17 – 2021-01-23 (×12): 15 mL via OROMUCOSAL
  Filled 2021-01-17 (×12): qty 15

## 2021-01-17 MED ORDER — ORAL CARE MOUTH RINSE
15.0000 mL | Freq: Two times a day (BID) | OROMUCOSAL | Status: DC
  Administered 2021-01-17 – 2021-01-23 (×13): 15 mL via OROMUCOSAL

## 2021-01-17 MED ORDER — FREE WATER
200.0000 mL | Status: DC
  Administered 2021-01-17 – 2021-01-22 (×34): 200 mL

## 2021-01-17 NOTE — Progress Notes (Addendum)
STROKE TEAM PROGRESS NOTE   INTERVAL HISTORY Patient seen in room with no family at the bedside.  He has been hemodynamically stable.  Afebrile overnight. His neurological exam remains unchanged without improvement overtime. He is still on the trach collar and has had his PEG tube placed 12/16. Repeat MRI was was also done 12/16 which shows interval increase in size and number of acute infarcts in bilateral cerebral hemispheres with increased involvement of cerebellum bilaterally.  Stable appearance of the intraventricular hemorrhage in the frontal horns and occipital horns unchanged.  Left caudate hemorrhage is also unchanged.  Plan to move out of the ICU today.  Will consult hospitalist for medical management and taking over care.  Vitals:   01/17/21 1100 01/17/21 1200 01/17/21 1300 01/17/21 1325  BP: (!) 143/105 (!) 147/76 (!) 191/116 (!) 150/76  Pulse: 94 90 (!) 107 (!) 102  Resp: (!) 22 (!) 22 (!) 22 20  Temp:  99.2 F (37.3 C)    TempSrc:  Axillary    SpO2: 100% 100% 99% 99%  Weight:      Height:       CBC:  Recent Labs  Lab 01/16/21 0402 01/16/21 1215 01/17/21 0342  WBC 12.6*  --  10.9*  NEUTROABS 10.4*  --  8.9*  HGB 10.1* 9.5* 10.1*  HCT 32.6* 28.0* 33.0*  MCV 76.7*  --  77.1*  PLT 363  --  [id number]    Basic Metabolic Panel:  Recent Labs  Lab 01/11/21 0323 01/12/21 1301 01/16/21 0402 01/16/21 1215 01/17/21 0342  NA 144   < > 155* 159* 156*  K 4.5   < > 3.9 3.7 5.0  CL 113*   < > 118*  --  121*  CO2 17*   < > 25  --  23  GLUCOSE 178*   < > 122*  --  129*  BUN 67*   < > 62*  --  61*  CREATININE 2.98*   < > 2.03*  --  2.02*  CALCIUM 8.1*   < > 8.8*  --  8.8*  MG 2.7*  --   --   --   --   PHOS 5.8*  --   --   --   --    < > = values in this interval not displayed.    Lipid Panel:  Recent Labs  Lab 01/15/21 0706  TRIG 113     IMAGING past 24 hours MR BRAIN WO CONTRAST  Result Date: 01/16/2021 CLINICAL DATA:  Stroke suspected patient not waking after  being off sedation for several days, follow-up EXAM: MRI HEAD WITHOUT CONTRAST TECHNIQUE: Multiplanar, multiecho pulse sequences of the brain and surrounding structures were obtained without intravenous contrast. COMPARISON:  01/04/2019. FINDINGS: Brain: Multifocal restricted diffusion, throughout the bilateral cerebral hemispheres, which have increased in size and number compared to 01/03/2021. These are primarily in the white matter. The size discrepancy is most prominent adjacent to body of the lateral ventricles, where a large confluent area of abnormal signal in the left periventricular white matter measures up to 5.0 x 1.6 cm in the axial plane (series 2, image 36), previously multiple small foci, which were not confluent. Numerous foci are also now seen in the bilateral cerebellar hemispheres (series 2, image 13). These areas of diffusion restriction demonstrate ADC and are felt to be acute. Redemonstrated intraventricular hemorrhage, now primarily in the frontal horns and layering in the occipital horns. Overall unchanged size and configuration of the ventricles. Redemonstrated parenchymal  hemorrhage involving the left caudate head. Additional foci of susceptibility in the cerebellum, pons, medulla right putamen, and subcortical white matter, without definite new focus of hemorrhage. No significant mass effect or midline shift. Vascular: Normal flow voids. Skull and upper cervical spine: Normal marrow signal. Sinuses/Orbits: Mucous retention cyst in the left maxillary sinus. Mucosal thickening in the bilateral sphenoid sinuses and ethmoid air cells. Layering fluid in the right maxillary sinus. The orbits are unremarkable. Other: Fluid in the bilateral mastoid air cells. IMPRESSION: 1. Interval increase in the size and number of acute infarcts in the bilateral cerebral hemispheres, now with increased involvement in the cerebellum bilaterally. 2. Redemonstrated intraventricular hemorrhage, which is now  primarily in the frontal horns and layering in the occipital horns. Overall unchanged size and configuration of the ventricles. 3. Redemonstrated parenchymal hemorrhage involving the left caudate head, with additional foci of hemosiderin deposition bilaterally. No definite new foci of intraparenchymal hemorrhage. These results will be called to the ordering clinician or representative by the Radiologist Assistant, and communication documented in the PACS or [employer]. Electronically Signed   By: Wiliam Ke M.D.   On: 01/16/2021 21:34    PHYSICAL EXAM General:  Patient is a well-developed, well-nourished middle-aged African-American male with a tracheostomy.  Respiratory: Patient with tracheostomy, on ventilator   Neurological:Patient is lying in bed unresponsive with eyes open does not respond to voice or sternal rub. PERRL, doll's eyes reflex present, corneal reflexes present bilaterally but weak. Cough and gag reflexes present.  Will flicker LUE to noxious stimuli but no response in other extremities.   ASSESSMENT/PLAN Stephen Delgado is a 55 y.o. male with history of HTN (questionable compliance with medications), DM and CKD 3 presenting with headache and altered mental status. 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  He was hypertensive on arrival to 238/160 and was given Cleviprex for BP control.  He was also agitated and required restraints.  Repeat head CT 11/30 shows unchanged IPH with IVH  Head CTs done on 12/1 and 12/3 also demonstrates unchanged IPH with IVH.  MRI/MRA completed on 12/4.patient's neurological exam continues to remain the same.  Goals of care conversation happened 12/11. Tracheostomy performed 12/13.  PEG tube placed 12/16.  Repeat MRI 12/16.  ICH score 2  ICH with IVH - left basal ganglia ICH with IVH likely secondary to hypertensive emergency  bilateral lacunar infarcts - likely small vessel disease vs. Strict BP control in  the setting of ICH CT head Large basal ganglia IPH with IVH involving the lateral, third and fourth ventricles with no significant mass effect or midline shift.  Repeat CT 11/30 0116 Unchanged IPH with IVH Repeat CT head 11/30 1629  Stable IPH with IVH, stable mild hydrocephalus Repeat CT Head 12/6 - Unchanged IVH and left caudate head intraparenchymal component. No new site of hemorrhage. 2D Echo EF 50-55%, grade 1 diastolic dysfunction, no atrial level shunt MRI-12/3-Numerous small acute infarcts in bilateral cerebral hemispheres. Minimal involvement of the right superior cerebellum.  Left caudate hemorrhage, intraventricular hemorrhage, and scattered sulcal subarachnoid hemorrhage as seen on recent CT imaging. Associated mild communicating hydrocephalus. Numerous chronic microhemorrhages probably secondary to hypertension. Chronic microvascular ischemic changes MRI-12/16-increase in the size and number of acute infarcts in the bilateral hemispheres with increased involvement in the cerebellum bilaterally. MRA head and neck 10/4- Mildly tortuous cervical carotid and vertebral arteries without stenosis. Intracranial hemorrhage and ventricles appears stable from the  MRI yesterday. CUS unremarkable LDL 111.4 HgbA1c 6.3 VTE prophylaxis - SCDs aspirin 81 mg daily prior to admission, now on No antithrombotic secondary to [place] Therapy recommendations:  SNF Disposition:  pending  Respiratory distress S/p tracheostomy CCM on board Tachypnea- PRN fentanyl and Versed ETT exchanged 12/4 due to a cuff leak Precedex for sedation currently running. Prop is on hold  Hypertensive emergency- improving Home meds:  Amlodipine 10 mg daily, metoprolol succinate 25 mg daily,  Unstable No longer requiring Cardene to maintain BP at goal.  SBP goal <160 Currently On coreg 25mg , Clonidine 0.3->0.2->0.1 mg q8h and minoxidil 10mg  daily Long-term BP goal normotensive  Hyperlipidemia Home meds:  none LDL 111.4,  goal < 70 Hold off statin for now due to acute ICH Consider statin at discharge         AKI on CKD Cre 2.98->2.45-> 2.65-> 2.11-> 2.15 On TF Close monitoring I/O CCM on board  Hyperglycemia CBG monitoring SSI On Levemir 20 units twice daily On NovoLog 5 units q4      Dysphagia Did not pass swallow Coretrak in place with tube feed PEG tube placement 12/16  Other Stroke Risk Factors Questionable compliance with medications at home        Other Active Problems Code status - full code Hypokalemia 3.1 -> 3.4 -> 3.5-> 4.5->3.6-> 3.1-> 3.6-> 3.9->3.7 Resolved Hypernatremia- 153->152->149->148-> 144->142->143-> 145-> 156->154  - free water 200 ml Q 2 hours  - D5W at 25 mL/hr Ileus seen on KUB- fleet enema given 12/1; bowel regimen in place Aspiration pneumonia  Zosyn started 12/15 per CCM  Now afebrile WBCs  15.1->12.6 Anemia - Hb 10.1  Hospital day # 18  Patient seen and examined by NP/APP with MD. MD to update note as needed.   Janine Ores, DNP, FNP-BC [facility] Pager: [phone]  STROKE MD NOTE : .I have personally obtained history,examined this patient, reviewed notes, independently viewed imaging studies, participated in medical decision making and plan of care.ROS completed by me personally and pertinent positives fully documented  I have made any additions or clarifications directly to the above note. Agree with note above.  Patient neurological exam remains quite poor and repeat MRI scan shows extension of bilateral cortical as well as cerebellar infarcts and stable appearance of the left [place] [facility] and intraventricular hemorrhage.  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  He is undergoing tracheostomy and PEG tube.  Recommend transfer out of the ICU to stepdown and transferred to medical hospitalist team for ongoing medical management.  No family available at the bedside.  Discussed with Dr. Tacy Learn critical care  medicine.This patient is critically ill and at significant risk of neurological worsening, death and care requires constant monitoring of vital signs, hemodynamics,respiratory and cardiac monitoring, extensive review of multiple databases, frequent neurological assessment, discussion with family, other specialists and medical decision making of high complexity.I have made any additions or clarifications directly to the above note.This critical care time does not reflect procedure time, or teaching time or supervisory time of PA/NP/Med Resident etc but could involve care discussion time.  I spent 30 minutes of neurocritical care time  in the care of  this patient.      Antony Contras, MD Medical Director [facility] Pager: [phone] 01/17/2021 2:29 PM    To contact Stroke Continuity provider, please refer to [email or web address] After hours, contact General Neurology

## 2021-01-17 NOTE — Progress Notes (Signed)
°  Subjective No acute events.   Objective: Vital signs in last 24 hours: Temp:  [98.3 F (36.8 C)-101.2 F (38.4 C)] 99.5 F (37.5 C) (12/17 0400) Pulse Rate:  [77-96] 92 (12/17 0739) Resp:  [8-25] 18 (12/17 0739) BP: (114-153)/(55-93) 151/90 (12/17 0739) SpO2:  [99 %-100 %] 100 % (12/17 0739) FiO2 (%):  [28 %-100 %] 28 % (12/17 0739) Weight:  [86.4 kg] 86.4 kg (12/17 0500) Last BM Date: 01/16/21  Intake/Output from previous day: 12/16 0701 - 12/17 0700 In: 1926.5 [I.V.:808.3; NG/GT:934.5; IV Piggyback:183.7] Out: 2530 [Urine:2200; Stool:330] Intake/Output this shift: No intake/output data recorded.  Gen: NAD, comfortable CV: RRR Pulm: Normal work of breathing Abd: Soft, nondistended; peg in place, tube feeds running Ext: SCDs in place  Lab Results: CBC  Recent Labs    01/16/21 0402 01/16/21 1215 01/17/21 0342  WBC 12.6*  --  10.9*  HGB 10.1* 9.5* 10.1*  HCT 32.6* 28.0* 33.0*  PLT 363  --  355   BMET Recent Labs    01/16/21 0402 01/16/21 1215 01/17/21 0342  NA 155* 159* 156*  K 3.9 3.7 5.0  CL 118*  --  121*  CO2 25  --  23  GLUCOSE 122*  --  129*  BUN 62*  --  61*  CREATININE 2.03*  --  2.02*  CALCIUM 8.8*  --  8.8*   PT/INR No results for input(s): LABPROT, INR in the last 72 hours. ABG Recent Labs    01/16/21 1215  PHART 7.501*  HCO3 28.2*    Studies/Results:  Anti-infectives: Anti-infectives (From admission, onward)    Start     Dose/Rate Route Frequency Ordered Stop   01/15/21 1630  piperacillin-tazobactam (ZOSYN) IVPB 3.375 g        3.375 g 12.5 mL/hr over 240 Minutes Intravenous Every 8 hours 01/15/21 0943     01/15/21 1030  piperacillin-tazobactam (ZOSYN) IVPB 3.375 g        3.375 g 100 mL/hr over 30 Minutes Intravenous  Once 01/15/21 0943 01/15/21 1035   01/05/21 1600  ceFAZolin (ANCEF) IVPB 2g/100 mL premix        2 g 200 mL/hr over 30 Minutes Intravenous Every 8 hours 01/05/21 0853 01/10/21 0848   01/03/21 0830   piperacillin-tazobactam (ZOSYN) IVPB 3.375 g  Status:  Discontinued        3.375 g 12.5 mL/hr over 240 Minutes Intravenous Every 8 hours 01/03/21 0800 01/05/21 0853        Assessment/Plan: Patient Active Problem List   Diagnosis Date Noted   Acute respiratory failure (HCC)    ICH (intracerebral hemorrhage) (HCC) 12/30/2020   Hyperlipidemia associated with type 2 diabetes mellitus (HCC) 02/28/2016   Healthcare maintenance 02/08/2016   Sinus congestion 04/28/2015   Diabetes mellitus (HCC) 04/28/2015   Accelerated hypertension 04/19/2015   Abnormal EKG 04/19/2015   Chronic kidney disease (CKD) 04/19/2015   Microcytosis 04/19/2015   s/p Procedure(s): PERCUTANEOUS ENDOSCOPIC GASTROSTOMY (PEG) PLACEMENT 01/16/2021  -Continue tube feeds per primary -We will remain available if questions/concerns arise   LOS: 18 days   Marin Olp, MD [facility] Surgery Use [email or web address] to contact on call provider

## 2021-01-17 NOTE — Progress Notes (Signed)
NAME:  Stephen Delgado, MRN:  [id number], DOB:  11-30-1965, LOS: 45 ADMISSION DATE:  12/30/2020, CONSULTATION DATE:  01/01/21 REFERRING MD:  xu (stroke), CHIEF COMPLAINT:  respiratory failure   History of Present Illness:  55 yo male presented with headache, nausea and altered mental status.  CT head showed acute Lt ICH with IVH likely related to hypertensive emergency (BP 195/116).  Had persistent hypertension and concern for airway protection, and PCCM asked to assist with ICU management.  Pertinent  Medical History  HTN, DM type 2, CKD 3a, OSA  Significant Hospital Events:  11/29 Admit, start cleviprex 12/01 PCCM consulted; changed from cleviprex to cardene due to elevated triglycerides 12/03 intubated; episode of vomiting >> tube feeds held 12/04 resume trickle tube feeds 12/06 remains minimally responsive. Tolerating SBT.  12/12 no acute events overnight, T-max 102 point 12/13 bedside trach planned midmorning  12/14 ATC. Lasix --> 5L UOP  12/15 cont on trach collar. Na to 154 from 145. Adding low rate d5  12/16 scheduled for PEG. Cont D5. Continues on trach collar   Studies:  CT head 11/29 >> large amount of IVH involving lateral/3rd/4th ventricles, ICH in Lt BG, chronic microvascular ischemic changes Echo 11/30 >> EF 50 to 55%, mod LVH, grade 1 DD, ascending aorta 37 mm EEG 12/01 >> continuous generalized slowing MRI brain 12/03 >> numerous small acute infarcts in b/l cerebral hemispheres, Lt caudate hemorrhage, IVH, scattered sulcal SAH, mild communicating hydrocephalus 12/5 Started on free water for hypernatremia. Cefazolin for MSSA pneumonia.  Interim History / Subjective:  No overnight issues Serum sodium remains at 156 MRI brain was done which was consistent with acute bilateral multiple strokes  Objective   Blood pressure 129/62, pulse 72, temperature 99.5 F (37.5 C), temperature source Axillary, resp. rate 17, height 6' (1.829 m), weight 86.4 kg, SpO2 100 %.     Vent Mode: PRVC FiO2 (%):  [28 %-100 %] 28 % Set Rate:  [16 bmp] 16 bmp Vt Set:  [620 mL] 620 mL PEEP:  [5 cmH20] 5 cmH20 Plateau Pressure:  [16 cmH20] 16 cmH20   Intake/Output Summary (Last 24 hours) at 01/17/2021 1010 Last data filed at 01/17/2021 0800 Gross per 24 hour  Intake 1955.08 ml  Output 2530 ml  Net -574.92 ml   Filed Weights   01/15/21 0331 01/16/21 0407 01/17/21 0500  Weight: 85.7 kg 86.8 kg 86.4 kg    Examination: General: Critically ill appearing middle aged M, s/p trach  HEENT: NCAT. Trach secure anicteric sclera pink mmm  Neuro:. Pupils equal round reactive. slight decerebrate posturing to painful stimuli in both upper extremities, no movement to painful stimuli in lower extremities CV: rr s1s2 no rgm cap refill brisk 2+ radial pulses  PULM: diminished bibasilar sounds. CTAb, symmetrical chest expansion without accessory muscle use on ATC  GI: soft ndnt + bowel sounds. Fecal collection device  Extremities: no acute joint deformity. No cyanosis or clubbing. 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 Repeat MRI yesterday showed multiple bilateral acute strokes, embolic in nature Persistent left basal ganglia intraparenchymal hemorrhage with IVH, stable Continue to watch Stroke team is following    Acute respiratory failure with hypoxia s/p trach Due to intracranial process, MSSA PNA and now Serratia pneumonia  Patient completed treatment for MSSA pneumonia  He is tolerating trach collar Respiratory culture grew Serratia Continue Zosyn to complete 7 days therapy Cont trach collar Routine trach care  HTN Hypertensive emergency on admission  Continue oral antihypertensive meds via PEG tube  AKI on  CKD stage IIIb Hypernatremia Serum creatinine is trending down after aggressive diuresis with albumin Serum sodium went up because of albumin therapy Continue D5W at 40 cc an hour Started on free water flushes Monitor intake and output Closely monitor electrolytes   Poorly controlled DM with hyperglycemia Now blood sugars at goal Continue basal and bolus insulin  Acute liver injury  LFTs improved  Protein calorie malnutrition Continue dietary supplements  Acute urinary retention  Continue Urecholine Voiding trial in couple of days  Goals of care Palliative has seen and s/o Full code, aggressive care S/p trach. Pending PEG  Cont ATC efforts and dispo planning    Best Practice (right click and "Reselect all SmartList Selections" daily)   Diet/type: tubefeeds  DVT prophylaxis: prophylactic heparin  GI prophylaxis: PPI Lines: N/A Foley:  n/a  Code Status:  full code  Family: 12/16 -- ccm and sons at bedside. Palliative has been engaged and family has wished for trach/peg / LTAC dispo  Dispo: In ICU 12/16 preceding PEG -- will eval ability to transfer out of ICU to progressive following PEG procedure      Cheri Fowler MD [facility] See Amion for pager If no response to pager, please call [phone] until 7pm After 7pm, Please call E-link [phone]

## 2021-01-18 ENCOUNTER — Encounter (HOSPITAL_COMMUNITY): Payer: Self-pay | Admitting: General Surgery

## 2021-01-18 DIAGNOSIS — I610 Nontraumatic intracerebral hemorrhage in hemisphere, subcortical: Secondary | ICD-10-CM | POA: Diagnosis not present

## 2021-01-18 DIAGNOSIS — J9601 Acute respiratory failure with hypoxia: Secondary | ICD-10-CM | POA: Diagnosis not present

## 2021-01-18 LAB — CULTURE, RESPIRATORY W GRAM STAIN: Gram Stain: NONE SEEN

## 2021-01-18 LAB — CBC WITH DIFFERENTIAL/PLATELET
Abs Immature Granulocytes: 0.07 10*3/uL (ref 0.00–0.07)
Basophils Absolute: 0 10*3/uL (ref 0.0–0.1)
Basophils Relative: 0 %
Eosinophils Absolute: 0.3 10*3/uL (ref 0.0–0.5)
Eosinophils Relative: 4 %
HCT: 29.8 % — ABNORMAL LOW (ref 39.0–52.0)
Hemoglobin: 9.2 g/dL — ABNORMAL LOW (ref 13.0–17.0)
Immature Granulocytes: 1 %
Lymphocytes Relative: 12 %
Lymphs Abs: 1 10*3/uL (ref 0.7–4.0)
MCH: 23.9 pg — ABNORMAL LOW (ref 26.0–34.0)
MCHC: 30.9 g/dL (ref 30.0–36.0)
MCV: 77.4 fL — ABNORMAL LOW (ref 80.0–100.0)
Monocytes Absolute: 0.5 10*3/uL (ref 0.1–1.0)
Monocytes Relative: 6 %
Neutro Abs: 6.7 10*3/uL (ref 1.7–7.7)
Neutrophils Relative %: 77 %
Platelets: 404 10*3/uL — ABNORMAL HIGH (ref 150–400)
RBC: 3.85 MIL/uL — ABNORMAL LOW (ref 4.22–5.81)
RDW: 15.6 % — ABNORMAL HIGH (ref 11.5–15.5)
WBC: 8.6 10*3/uL (ref 4.0–10.5)
nRBC: 0 % (ref 0.0–0.2)

## 2021-01-18 LAB — GLUCOSE, CAPILLARY
Glucose-Capillary: 142 mg/dL — ABNORMAL HIGH (ref 70–99)
Glucose-Capillary: 142 mg/dL — ABNORMAL HIGH (ref 70–99)
Glucose-Capillary: 150 mg/dL — ABNORMAL HIGH (ref 70–99)
Glucose-Capillary: 153 mg/dL — ABNORMAL HIGH (ref 70–99)
Glucose-Capillary: 172 mg/dL — ABNORMAL HIGH (ref 70–99)
Glucose-Capillary: 184 mg/dL — ABNORMAL HIGH (ref 70–99)

## 2021-01-18 LAB — SODIUM
Sodium: 154 mmol/L — ABNORMAL HIGH (ref 135–145)
Sodium: 152 mmol/L — ABNORMAL HIGH (ref 135–145)

## 2021-01-18 LAB — BASIC METABOLIC PANEL
Anion gap: 10 (ref 5–15)
BUN: 56 mg/dL — ABNORMAL HIGH (ref 6–20)
CO2: 24 mmol/L (ref 22–32)
Calcium: 8.8 mg/dL — ABNORMAL LOW (ref 8.9–10.3)
Chloride: 121 mmol/L — ABNORMAL HIGH (ref 98–111)
Creatinine, Ser: 2.04 mg/dL — ABNORMAL HIGH (ref 0.61–1.24)
GFR, Estimated: 37 mL/min — ABNORMAL LOW (ref >60)
Glucose, Bld: 160 mg/dL — ABNORMAL HIGH (ref 70–99)
Potassium: 4 mmol/L (ref 3.5–5.1)
Sodium: 155 mmol/L — ABNORMAL HIGH (ref 135–145)

## 2021-01-18 MED ORDER — FREE WATER: 200.0000 mL | Freq: Four times a day (QID) | Status: DC

## 2021-01-18 NOTE — Progress Notes (Signed)
PROGRESS NOTE    Stephen Delgado  [id number] DOB: 07-Mar-1965 DOA: 12/30/2020 PCP: Patient, [name]   Brief Narrative:  [name] is a 55 y.o. male with history of HTN (questionable compliance with medications), DM and CKD 3 presented with headache and altered mental status. 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  He was hypertensive on arrival to 238/160 and was given Cleviprex for BP control.  He was also agitated and required restraints.  Repeat head CT 11/30 shows unchanged IPH with IVH  Head CTs done on 12/1 and 12/3 also demonstrates unchanged IPH with IVH.  MRI/MRA completed on 12/4.patient's neurological exam continues to remain the same.  Goals of care conversation happened 12/11. Tracheostomy performed 12/13.  PEG tube placed 12/16.  Repeat MRI 12/16 increase in the size and number of acute infarcts in the bilateral hemispheres with increased involvement in the cerebellum bilaterally.  Transferred from neuro service to [facility] on 01/18/2021.  Significant Hospital Events:  11/29 Admit, start cleviprex 12/01 PCCM consulted; changed from cleviprex to cardene due to elevated triglycerides 12/03 intubated; episode of vomiting >> tube feeds held 12/04 resume trickle tube feeds 12/06 remains minimally responsive. Tolerating SBT.  12/12 no acute events overnight, T-max 102 point 12/13 bedside trach planned midmorning  12/14 ATC. Lasix --> 5L UOP  12/15 cont on trach collar. Na to 154 from 145. Adding low rate d5  12/16 scheduled for PEG. Cont D5. Continues on trach collar    Studies:  CT head 11/29 >> large amount of IVH involving lateral/3rd/4th ventricles, ICH in Lt BG, chronic microvascular ischemic changes Echo 11/30 >> EF 50 to 55%, mod LVH, grade 1 DD, ascending aorta 37 mm EEG 12/01 >> continuous generalized slowing MRI brain 12/03 >> numerous small acute infarcts in b/l cerebral hemispheres, Lt caudate hemorrhage, IVH,  scattered sulcal SAH, mild communicating hydrocephalus 12/5 Started on free water for hypernatremia. Cefazolin for MSSA pneumonia.  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  Continue trach care and tube feedings via PEG tube.  Discussion with PCCM and neurology, patient carries extremely poor prognosis.   Acute respiratory failure with hypoxia s/p trach Due to intracranial process, MSSA PNA and now Serratia pneumonia  Patient completed treatment for MSSA pneumonia He is tolerating trach collar Respiratory culture grew Serratia Continue Zosyn to complete 7 days therapy Cont trach collar Routine trach care.  Discussed with PCCM.  Patient's tachypnea secondary to brain injury which will likely remain the same, but oxygenation is stable.   HTN Hypertensive emergency on admission.  Now blood pressure very well controlled. Continue oral antihypertensive meds via PEG tube   AKI on CKD stage IIIa/hyponatremia: Please note that previous notes have documented that patient had history of CKD 3B however per my review, patient appears to have had CKD stage IIIb and he also has AKI now.  However based on the fact that his creatinine has remained stable around 2 since last 5 to 6 days, now he may have new baseline with CKD stage IV.  He still has hyponatremia.  I am going to increase his dextrose fluids.  He is already getting free water flushes.  Monitor closely.  Poorly controlled DM with hyperglycemia: Fairly controlled now, continue Lantus 20 units, NovoLog 5 units every 4 hours and SSI.  Acute liver injury  LFTs improved   Protein calorie malnutrition Continue dietary supplements   Acute  urinary retention  Continue Urecholine Voiding trial in couple of days   Goals of care Palliative has seen and s/o Full code, aggressive care S/p trach/PEG.  I am going to reconsult palliative care since patient has no prognosis at all.  DVT prophylaxis: heparin injection 5,000 Units Start: 01/05/21 1400 SCD's Start: 12/30/20 1908   Code Status: Full Code  Family Communication:  None present at bedside.    Status is: Inpatient  Remains inpatient appropriate because: On trach  Estimated body mass index is 26.67 kg/m as calculated from the following:   Height as of this encounter: 6' (1.829 m).   Weight as of this encounter: 89.2 kg.  Nutritional Assessment: Body mass index is 26.67 kg/m.Marland Kitchen Seen by dietician.  I agree with the assessment and plan as outlined below: Nutrition Status: Nutrition Problem: Inadequate oral intake Etiology: lethargy/confusion, dysphagia Signs/Symptoms: NPO status Interventions: Tube feeding  .  Skin Assessment: I have examined the patient's skin and I agree with the wound assessment as performed by the wound care RN as outlined below:    Consultants:  Neurology Pulmonology  Procedures:  As above  Antimicrobials:  Anti-infectives (From admission, onward)    Start     Dose/Rate Route Frequency Ordered Stop   01/15/21 1630  piperacillin-tazobactam (ZOSYN) IVPB 3.375 g        3.375 g 12.5 mL/hr over 240 Minutes Intravenous Every 8 hours 01/15/21 0943     01/15/21 1030  piperacillin-tazobactam (ZOSYN) IVPB 3.375 g        3.375 g 100 mL/hr over 30 Minutes Intravenous  Once 01/15/21 0943 01/15/21 1035   01/05/21 1600  ceFAZolin (ANCEF) IVPB 2g/100 mL premix        2 g 200 mL/hr over 30 Minutes Intravenous Every 8 hours 01/05/21 0853 01/10/21 0848   01/03/21 0830  piperacillin-tazobactam (ZOSYN) IVPB 3.375 g  Status:  Discontinued        3.375 g 12.5 mL/hr over 240 Minutes Intravenous Every 8 hours 01/03/21 0800 01/05/21 0853           Subjective: Seen and examined.  Patient obtunded on trach and PEG tube.  Objective: Vitals:   01/18/21 1112 01/18/21 1129 01/18/21 1429 01/18/21 1446  BP: (!) 144/82 (!) 144/82 121/70 121/70  Pulse:  (!) 103  (!) 112  Resp:  (!) 27  (!) 37  Temp: 98 F (36.7 C)  (!) 101.9 F (38.8 C)   TempSrc:   Axillary   SpO2:  98%  97%  Weight:      Height:        Intake/Output Summary (Last 24 hours) at 01/18/2021 1538 Last data filed at 01/18/2021 1511 Gross per 24 hour  Intake 3309.64 ml  Output 2625 ml  Net 684.64 ml   Filed Weights   01/16/21 0407 01/17/21 0500 01/18/21 0344  Weight: 86.8 kg 86.4 kg 89.2 kg    Examination:  General exam: Appears calm and comfortable  Respiratory system: Slightly tachypneic but lungs clear to auscultation.trach in place. Cardiovascular system: S1 & S2 heard, RRR. No JVD, murmurs, rubs, gallops or clicks. No pedal edema. Gastrointestinal system: Abdomen is nondistended, soft and nontender. No organomegaly or masses felt. Normal bowel sounds heard. Central nervous system: Unresponsive/obtunded.  Data Reviewed: I have  personally reviewed following labs and imaging studies  CBC: Recent Labs  Lab 01/14/21 0358 01/15/21 0706 01/16/21 0402 01/16/21 1215 01/17/21 0342 01/18/21 0456  WBC 11.9* 15.1* 12.6*  --  10.9* 8.6  NEUTROABS 10.1* 12.6* 10.4*  --  8.9* 6.7  HGB 9.5* 10.0* 10.1* 9.5* 10.1* 9.2*  HCT 29.5* 30.9* 32.6* 28.0* 33.0* 29.8*  MCV 74.9* 74.3* 76.7*  --  77.1* 77.4*  PLT 274 271 363  --  355 Q000111Q*   Basic Metabolic Panel: Recent Labs  Lab 01/15/21 0706 01/15/21 1116 01/15/21 2258 01/16/21 0402 01/16/21 1215 01/17/21 0342 01/18/21 0456 01/18/21 1230  NA 154* 156*   < > 155* 159* 156* 155* 154*  K 4.8 3.9  --  3.9 3.7 5.0 4.0  --   CL 124* 119*  --  118*  --  121* 121*  --   CO2 19* 27  --  25  --  23 24  --   GLUCOSE 168* 189*  --  122*  --  129* 160*  --   BUN 69* 67*  --  62*  --  61* 56*  --   CREATININE  2.11* 2.15*  --  2.03*  --  2.02* 2.04*  --   CALCIUM 8.4* 8.8*  --  8.8*  --  8.8* 8.8*  --    < > = values in this interval not displayed.   GFR: Estimated Creatinine Clearance: 43.9 mL/min (A) (by C-G formula based on SCr of 2.04 mg/dL (H)). Liver Function Tests: Recent Labs  Lab 01/12/21 1301 01/13/21 0712 01/14/21 0358 01/15/21 0706 01/16/21 0402  AST 56* 54* 163* 68* 58*  ALT 36 38 133* 97* 76*  ALKPHOS 73 64 105 96 89  BILITOT 0.4 0.5 0.6 0.6 0.6  PROT 5.6* 5.6* 5.6* 6.2* 6.4*  ALBUMIN 1.6* 1.6* 1.8* 2.0* 2.0*   No results for input(s): LIPASE, AMYLASE in the last 168 hours. No results for input(s): AMMONIA in the last 168 hours. Coagulation Profile: No results for input(s): INR, PROTIME in the last 168 hours. Cardiac Enzymes: No results for input(s): CKTOTAL, CKMB, CKMBINDEX, TROPONINI in the last 168 hours. BNP (last 3 results) No results for input(s): PROBNP in the last 8760 hours. HbA1C: No results for input(s): HGBA1C in the last 72 hours. CBG: Recent Labs  Lab 01/17/21 1952 01/18/21 0339 01/18/21 0721 01/18/21 1204 01/18/21 1531  GLUCAP 139* 142* 153* 172* 184*   Lipid Profile: No results for input(s): CHOL, HDL, LDLCALC, TRIG, CHOLHDL, LDLDIRECT in the last 72 hours. Thyroid Function Tests: No results for input(s): TSH, T4TOTAL, FREET4, T3FREE, THYROIDAB in the last 72 hours. Anemia Panel: No results for input(s): VITAMINB12, FOLATE, FERRITIN, TIBC, IRON, RETICCTPCT in the last 72 hours. Sepsis Labs: No results for input(s): PROCALCITON, LATICACIDVEN in the last 168 hours.  Recent Results (from the past 240 hour(s))  Culture, blood (Routine X 2) w Reflex to ID Panel     Status: None (Preliminary result)   Collection Time: 01/14/21  7:33 PM   Specimen: BLOOD LEFT HAND  Result Value Ref Range Status   Specimen Description BLOOD LEFT HAND  Final   Special Requests   Final    BOTTLES DRAWN AEROBIC ONLY Blood Culture adequate volume   Culture   Final     NO GROWTH 4 DAYS Performed at [facility], [address], [place], [place] [address]    Report Status PENDING  Incomplete  Culture, blood (Routine X 2) w Reflex  to ID Panel     Status: None (Preliminary result)   Collection Time: 01/14/21  7:33 PM   Specimen: BLOOD RIGHT HAND  Result Value Ref Range Status   Specimen Description BLOOD RIGHT HAND  Final   Special Requests   Final    BOTTLES DRAWN AEROBIC ONLY Blood Culture results may not be optimal due to an inadequate volume of blood received in culture bottles   Culture   Final    NO GROWTH 4 DAYS Performed at [facility], [place] [address], [place], [place] [address]    Report Status PENDING  Incomplete  Culture, Respiratory w Gram Stain     Status: None   Collection Time: 01/15/21  1:09 PM   Specimen: Tracheal Aspirate; Respiratory  Result Value Ref Range Status   Specimen Description TRACHEAL ASPIRATE  Final   Special Requests NONE  Final   Gram Stain   Final    NO WBC SEEN NO ORGANISMS SEEN Performed at [facility], [address], [place], [place] [address]    Culture   Final    RARE SERRATIA MARCESCENS RARE STAPHYLOCOCCUS AUREUS    Report Status 01/18/2021 FINAL  Final   Organism ID, Bacteria SERRATIA MARCESCENS  Final   Organism ID, Bacteria STAPHYLOCOCCUS AUREUS  Final      Susceptibility   Staphylococcus aureus - MIC*    CIPROFLOXACIN <=0.5 SENSITIVE Sensitive     ERYTHROMYCIN RESISTANT Resistant     GENTAMICIN <=0.5 SENSITIVE Sensitive     OXACILLIN 0.5 SENSITIVE Sensitive     TETRACYCLINE <=1 SENSITIVE Sensitive     VANCOMYCIN <=0.5 SENSITIVE Sensitive     TRIMETH/SULFA <=10 SENSITIVE Sensitive     CLINDAMYCIN RESISTANT Resistant     RIFAMPIN <=0.5 SENSITIVE Sensitive     Inducible Clindamycin POSITIVE Resistant     * RARE STAPHYLOCOCCUS AUREUS   Serratia marcescens - MIC*    CEFAZOLIN >=64 RESISTANT Resistant     CEFEPIME <=0.12 SENSITIVE Sensitive     CEFTAZIDIME <=1 SENSITIVE  Sensitive     CEFTRIAXONE <=0.25 SENSITIVE Sensitive     CIPROFLOXACIN <=0.25 SENSITIVE Sensitive     GENTAMICIN <=1 SENSITIVE Sensitive     TRIMETH/SULFA <=20 SENSITIVE Sensitive     * RARE SERRATIA MARCESCENS      Radiology Studies: MR BRAIN WO CONTRAST  Result Date: 01/16/2021 CLINICAL DATA:  Stroke suspected patient not waking after being off sedation for several days, follow-up EXAM: MRI HEAD WITHOUT CONTRAST TECHNIQUE: Multiplanar, multiecho pulse sequences of the brain and surrounding structures were obtained without intravenous contrast. COMPARISON:  01/04/2019. FINDINGS: Brain: Multifocal restricted diffusion, throughout the bilateral cerebral hemispheres, which have increased in size and number compared to 01/03/2021. These are primarily in the white matter. The size discrepancy is most prominent adjacent to body of the lateral ventricles, where a large confluent area of abnormal signal in the left periventricular white matter measures up to 5.0 x 1.6 cm in the axial plane (series 2, image 36), previously multiple small foci, which were not confluent. Numerous foci are also now seen in the bilateral cerebellar hemispheres (series 2, image 13). These areas of diffusion restriction demonstrate ADC and are felt to be acute. Redemonstrated intraventricular hemorrhage, now primarily in the frontal horns and layering in the occipital horns. Overall unchanged size and configuration of the ventricles. Redemonstrated parenchymal hemorrhage involving the left caudate head. Additional foci of susceptibility in the cerebellum, pons, medulla right putamen, and subcortical white matter, without definite  new focus of hemorrhage. No significant mass effect or midline shift. Vascular: Normal flow voids. Skull and upper cervical spine: Normal marrow signal. Sinuses/Orbits: Mucous retention cyst in the left maxillary sinus. Mucosal thickening in the bilateral sphenoid sinuses and ethmoid air cells. Layering  fluid in the right maxillary sinus. The orbits are unremarkable. Other: Fluid in the bilateral mastoid air cells. IMPRESSION: 1. Interval increase in the size and number of acute infarcts in the bilateral cerebral hemispheres, now with increased involvement in the cerebellum bilaterally. 2. Redemonstrated intraventricular hemorrhage, which is now primarily in the frontal horns and layering in the occipital horns. Overall unchanged size and configuration of the ventricles. 3. Redemonstrated parenchymal hemorrhage involving the left caudate head, with additional foci of hemosiderin deposition bilaterally. No definite new foci of intraparenchymal hemorrhage. These results will be called to the ordering clinician or representative by the Radiologist Assistant, and communication documented in the PACS or [employer]. Electronically Signed   By: Merilyn Baba M.D.   On: 01/16/2021 21:34    Scheduled Meds:  bethanechol  25 mg Per Tube TID   busPIRone  10 mg Per Tube TID   carvedilol  25 mg Per Tube BID WC   chlorhexidine  15 mL Mouth Rinse BID   Chlorhexidine Gluconate Cloth  6 each Topical Q0600   cloNIDine  0.1 mg Per Tube Q8H   feeding supplement (PROSource TF)  45 mL Per Tube Daily   free water  200 mL Per Tube Q4H   heparin injection (subcutaneous)  5,000 Units Subcutaneous Q8H   insulin aspart  0-20 Units Subcutaneous Q4H   insulin aspart  5 Units Subcutaneous Q4H   insulin detemir  20 Units Subcutaneous BID   mouth rinse  15 mL Mouth Rinse q12n4p   minoxidil  10 mg Per Tube Daily   pantoprazole sodium  40 mg Per Tube Daily   Continuous Infusions:  dextrose 75 mL/hr at 01/18/21 1511   feeding supplement (GLUCERNA 1.2 CAL) 1,000 mL (01/18/21 0227)   piperacillin-tazobactam (ZOSYN)  IV 12.5 mL/hr at 01/18/21 1511     LOS: 19 days   Time spent: 35 min    Darliss Cheney, MD [facility]  01/18/2021, 3:38 PM  Please page via [place] and do not message via secure chat for anything  urgent. Secure chat can be used for anything non urgent.  How to contact the [facility] Attending or Consulting provider [place] or covering provider during after hours [place], for this patient?  Check the care team in [facility] and look for a) attending/consulting [facility] provider listed and b) the [facility] team listed. Page or secure chat [phone]. Log into [email or web address] and use [place] universal password to access. If you do not have the password, please contact the hospital operator. Locate the [facility] provider you are looking for under [facility] and page to a number that you can be directly reached. If you still have difficulty reaching the provider, please page the [facility] (Director on Call) for the Hospitalists listed on amion for assistance.

## 2021-01-18 NOTE — Progress Notes (Signed)
RT called to patient's room due to tachypnea. RR 37.  HR 112, Sats 97%. RT checked patient's trach and inner cannula. Both found in correct position with no occulusion.  Patient does not have increased WOB but was found to have a fever. RT will continue to monitor.

## 2021-01-18 NOTE — Progress Notes (Signed)
STROKE TEAM PROGRESS NOTE   INTERVAL HISTORY Patient has been transferred to floor bed..  He has been hemodynamically stable.  Afebrile overnight. His neurological exam remains unchanged without improvement overtime. He is still on the trach collar and breathing well. Marland Kitchen  Appreciate medical hospitalist team taking over care.  No family at the bedside.  Neurological exam unchanged.  Vital signs stable  Vitals:   01/18/21 0746 01/18/21 0805 01/18/21 1112 01/18/21 1129  BP: 126/80  (!) 144/82 (!) 144/82  Pulse:  (!) 102  (!) 103  Resp: 11 (!) 22  (!) 27  Temp:   98 F (36.7 C)   TempSrc:      SpO2: 99%   98%  Weight:      Height:       CBC:  Recent Labs  Lab 01/17/21 0342 01/18/21 0456  WBC 10.9* 8.6  NEUTROABS 8.9* 6.7  HGB 10.1* 9.2*  HCT 33.0* 29.8*  MCV 77.1* 77.4*  PLT 355 Q000111Q*   Basic Metabolic Panel:  Recent Labs  Lab 01/17/21 0342 01/18/21 0456 01/18/21 1230  NA 156* 155* 154*  K 5.0 4.0  --   CL 121* 121*  --   CO2 23 24  --   GLUCOSE 129* 160*  --   BUN 61* 56*  --   CREATININE 2.02* 2.04*  --   CALCIUM 8.8* 8.8*  --    Lipid Panel:  Recent Labs  Lab 01/15/21 0706  TRIG 113    IMAGING past 24 hours No results found.  PHYSICAL EXAM General:  Patient is a well-developed, well-nourished middle-aged African-American male with a tracheostomy.  Respiratory: Patient with tracheostomy, on ventilator   Neurological:Patient is lying in bed unresponsive with eyes open does not respond to voice or sternal rub. PERRL, doll's eyes reflex present, corneal reflexes present bilaterally but weak. Cough and gag reflexes present.  Will flicker LUE to noxious stimuli but no response in other extremities.   ASSESSMENT/PLAN Mr. HARLEN TAIT is a 55 y.o. male with history of HTN (questionable compliance with medications), DM and CKD 3 presenting with headache and altered mental status. 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  He was  hypertensive on arrival to 238/160 and was given Cleviprex for BP control.  He was also agitated and required restraints.  Repeat head CT 11/30 shows unchanged IPH with IVH  Head CTs done on 12/1 and 12/3 also demonstrates unchanged IPH with IVH.  MRI/MRA completed on 12/4.patient's neurological exam continues to remain the same.  Goals of care conversation happened 12/11. Tracheostomy performed 12/13.  PEG tube placed 12/16.  Repeat MRI 12/16.  ICH score 2  ICH with IVH - left basal ganglia ICH with IVH likely secondary to hypertensive emergency  bilateral lacunar infarcts - likely small vessel disease vs. Strict BP control in the setting of ICH CT head Large basal ganglia IPH with IVH involving the lateral, third and fourth ventricles with no significant mass effect or midline shift.  Repeat CT 11/30 0116 Unchanged IPH with IVH Repeat CT head 11/30 1629  Stable IPH with IVH, stable mild hydrocephalus Repeat CT Head 12/6 - Unchanged IVH and left caudate head intraparenchymal component. No new site of hemorrhage. 2D Echo EF 99991111, grade 1 diastolic dysfunction, no atrial level shunt MRI-12/3-Numerous small acute infarcts in bilateral cerebral hemispheres. Minimal involvement of the right superior cerebellum.  Left caudate hemorrhage, intraventricular hemorrhage, and scattered sulcal subarachnoid  hemorrhage as seen on recent CT imaging. Associated mild communicating hydrocephalus. Numerous chronic microhemorrhages probably secondary to hypertension. Chronic microvascular ischemic changes MRI-12/16-increase in the size and number of acute infarcts in the bilateral hemispheres with increased involvement in the cerebellum bilaterally. MRA head and neck 10/4- Mildly tortuous cervical carotid and vertebral arteries without stenosis. Intracranial hemorrhage and ventricles appears stable from the MRI yesterday. CUS unremarkable LDL 111.4 HgbA1c 6.3 VTE prophylaxis - SCDs aspirin 81 mg daily prior to  admission, now on No antithrombotic secondary to IPH Therapy recommendations:  SNF Disposition:  pending  Respiratory distress S/p tracheostomy CCM on board Tachypnea- PRN fentanyl and Versed ETT exchanged 12/4 due to a cuff leak Precedex for sedation currently running. Prop is on hold  Hypertensive emergency- improving Home meds:  Amlodipine 10 mg daily, metoprolol succinate 25 mg daily,  Unstable No longer requiring Cardene to maintain BP at goal.  SBP goal <160 Currently On coreg 25mg , Clonidine 0.3->0.2->0.1 mg q8h and minoxidil 10mg  daily Long-term BP goal normotensive  Hyperlipidemia Home meds:  none LDL 111.4, goal < 70 Hold off statin for now due to acute ICH Consider statin at discharge         AKI on CKD Cre 2.98->2.45-> 2.65-> 2.11-> 2.15 On TF Close monitoring I/O CCM on board  Hyperglycemia CBG monitoring SSI On Levemir 20 units twice daily On NovoLog 5 units q4      Dysphagia Did not pass swallow Coretrak in place with tube feed PEG tube placement 12/16  Other Stroke Risk Factors Questionable compliance with medications at home        Other Active Problems Code status - full code Hypokalemia 3.1 -> 3.4 -> 3.5-> 4.5->3.6-> 3.1-> 3.6-> 3.9->3.7 Resolved Hypernatremia- 153->152->149->148-> 144->142->143-> 145-> 156->154  - free water 200 ml Q 2 hours  - D5W at 25 mL/hr Ileus seen on KUB- fleet enema given 12/1; bowel regimen in place Aspiration pneumonia  Zosyn started 12/15 per CCM  Now afebrile WBCs  15.1->12.6 Anemia - Hb 10.1  Hospital day # 19  Patient neurological exam remains 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  His prognosis for making meaningful improvement is not good but family wants to continue aggressive care at.  Appreciate medical.  Hospitalist team taking over his care.  Discussed with Dr. 14/1.  Greater than 50% time during this 25-minute visit was spent in counseling and coordination of care and discussion with care team and  answering questions.     1/16, MD Medical Director [facility] Pager: [phone] 01/18/2021 1:43 PM    To contact Stroke Continuity provider, please refer to [phone]. After hours, contact General Neurology

## 2021-01-19 DIAGNOSIS — I610 Nontraumatic intracerebral hemorrhage in hemisphere, subcortical: Secondary | ICD-10-CM | POA: Diagnosis not present

## 2021-01-19 DIAGNOSIS — J9601 Acute respiratory failure with hypoxia: Secondary | ICD-10-CM | POA: Diagnosis not present

## 2021-01-19 LAB — CBC WITH DIFFERENTIAL/PLATELET
Abs Immature Granulocytes: 0.12 10*3/uL — ABNORMAL HIGH (ref 0.00–0.07)
Basophils Absolute: 0 10*3/uL (ref 0.0–0.1)
Basophils Relative: 0 %
Eosinophils Absolute: 0.3 10*3/uL (ref 0.0–0.5)
Eosinophils Relative: 3 %
HCT: 28.9 % — ABNORMAL LOW (ref 39.0–52.0)
Hemoglobin: 8.9 g/dL — ABNORMAL LOW (ref 13.0–17.0)
Immature Granulocytes: 1 %
Lymphocytes Relative: 16 %
Lymphs Abs: 1.6 10*3/uL (ref 0.7–4.0)
MCH: 23.8 pg — ABNORMAL LOW (ref 26.0–34.0)
MCHC: 30.8 g/dL (ref 30.0–36.0)
MCV: 77.3 fL — ABNORMAL LOW (ref 80.0–100.0)
Monocytes Absolute: 0.6 10*3/uL (ref 0.1–1.0)
Monocytes Relative: 6 %
Neutro Abs: 7.4 10*3/uL (ref 1.7–7.7)
Neutrophils Relative %: 74 %
Platelets: 381 10*3/uL (ref 150–400)
RBC: 3.74 MIL/uL — ABNORMAL LOW (ref 4.22–5.81)
RDW: 15.4 % (ref 11.5–15.5)
WBC: 10 10*3/uL (ref 4.0–10.5)
nRBC: 0 % (ref 0.0–0.2)

## 2021-01-19 LAB — CULTURE, BLOOD (ROUTINE X 2)
Culture: NO GROWTH
Culture: NO GROWTH
Special Requests: ADEQUATE

## 2021-01-19 LAB — BASIC METABOLIC PANEL
Anion gap: 10 (ref 5–15)
BUN: 54 mg/dL — ABNORMAL HIGH (ref 6–20)
CO2: 21 mmol/L — ABNORMAL LOW (ref 22–32)
Calcium: 8.2 mg/dL — ABNORMAL LOW (ref 8.9–10.3)
Chloride: 117 mmol/L — ABNORMAL HIGH (ref 98–111)
Creatinine, Ser: 2.08 mg/dL — ABNORMAL HIGH (ref 0.61–1.24)
GFR, Estimated: 36 mL/min — ABNORMAL LOW (ref >60)
Glucose, Bld: 159 mg/dL — ABNORMAL HIGH (ref 70–99)
Potassium: 3.9 mmol/L (ref 3.5–5.1)
Sodium: 148 mmol/L — ABNORMAL HIGH (ref 135–145)

## 2021-01-19 LAB — GLUCOSE, CAPILLARY
Glucose-Capillary: 161 mg/dL — ABNORMAL HIGH (ref 70–99)
Glucose-Capillary: 142 mg/dL — ABNORMAL HIGH (ref 70–99)
Glucose-Capillary: 147 mg/dL — ABNORMAL HIGH (ref 70–99)
Glucose-Capillary: 153 mg/dL — ABNORMAL HIGH (ref 70–99)
Glucose-Capillary: 153 mg/dL — ABNORMAL HIGH (ref 70–99)

## 2021-01-19 NOTE — Progress Notes (Signed)
NAME:  Stephen Delgado, MRN:  [id number], DOB:  March 13, 1965, LOS: 71 ADMISSION DATE:  12/30/2020, CONSULTATION DATE:  12/19 REFERRING MD:  Erlinda Hong, CHIEF COMPLAINT:  Dyspnea   History of Present Illness:  55 yo male presented with headache, nausea and altered mental status.  CT head showed acute Lt ICH with IVH likely related to hypertensive emergency (BP 195/116).  Had persistent hypertension and concern for airway protection, and PCCM asked to assist with ICU management.  Tracheostomy 12/13.  Moved out of ICU.   Pertinent  Medical History  HTN, DM type 2, CKD 3a, OSA  Significant Hospital Events: Including procedures, antibiotic start and stop dates in addition to other pertinent events   11/29 Admit, start cleviprex 12/01 PCCM consulted; changed from cleviprex to cardene due to elevated triglycerides 12/03 intubated; episode of vomiting >> tube feeds held 12/04 resume trickle tube feeds 12/06 remains minimally responsive. Tolerating SBT.  12/12 no acute events overnight, T-max 102 point 12/13 bedside trach planned midmorning  12/14 ATC. Lasix --> 5L UOP  12/15 cont on trach collar. Na to 154 from 145. Adding low rate d5  12/16 scheduled for PEG. Cont D5. Continues on trach collar Out of ICU 12/19  Imaging/Studies: CT head 11/29 >> large amount of IVH involving lateral/3rd/4th ventricles, ICH in Lt BG, chronic microvascular ischemic changes Echo 11/30 >> EF 50 to 55%, mod LVH, grade 1 DD, ascending aorta 37 mm EEG 12/01 >> continuous generalized slowing MRI brain 12/03 >> numerous small acute infarcts in b/l cerebral hemispheres, Lt caudate hemorrhage, IVH, scattered sulcal SAH, mild communicating hydrocephalus 12/5 Started on free water for hypernatremia. Cefazolin for MSSA pneumonia.  Interim History / Subjective:   Moved out of ICU, no acute issues  Objective   Blood pressure 113/62, pulse 99, temperature 99.3 F (37.4 C), temperature source Axillary, resp. rate (!) 32, height  6' (1.829 m), weight 91 kg, SpO2 95 %.    FiO2 (%):  [21 %-28 %] 21 %   Intake/Output Summary (Last 24 hours) at 01/19/2021 1212 Last data filed at 01/19/2021 0211 Gross per 24 hour  Intake 1610.92 ml  Output 3450 ml  Net -1839.08 ml   Filed Weights   01/17/21 0500 01/18/21 0344 01/19/21 0500  Weight: 86.4 kg 89.2 kg 91 kg    Examination:  General:  In bed on vent HENT: NCAT tracheostomy in place PULM: Rhonchi B, CTA CV: RRR, no mgr GI: BS+, soft, nontender MSK: normal bulk and tone Neuro: No eye opening, some occassional head/mouth movement, arms flaccid   Resolved Hospital Problem list     Assessment & Plan:  Acute left basal ganglia intraparenchymal hemorrhage with IVH in the setting of hypertensive emergency Persistent encephalopathy due to intraparenchymal hemorrhage Acute bilateral multiple ischemic strokes, 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    Plan: Recommend palliative care consultation again (they were consulted but signed off initially before the MRI findings from late last week showed extensive ischemic disease of the brain) Tracheostomy care per routine Will see twice a week and prn  Best Practice (right click and "Reselect all SmartList Selections" daily)   Per HPI  Labs   CBC: Recent Labs  Lab 01/15/21 0706 01/16/21 0402 01/16/21 1215 01/17/21 0342 01/18/21 0456 01/19/21 0343  WBC  15.1* 12.6*  --  10.9* 8.6 10.0  NEUTROABS 12.6* 10.4*  --  8.9* 6.7 7.4  HGB 10.0* 10.1* 9.5* 10.1* 9.2* 8.9*  HCT 30.9* 32.6* 28.0* 33.0* 29.8* 28.9*  MCV 74.3* 76.7*  --  77.1* 77.4* 77.3*  PLT 271 363  --  355 404* [id number]    Basic Metabolic Panel: Recent Labs  Lab 01/15/21 1116  01/15/21 2258 01/16/21 0402 01/16/21 1215 01/17/21 0342 01/18/21 0456 01/18/21 1230 01/18/21 1807 01/19/21 0343  NA 156*   < > 155* 159* 156* 155* 154* 152* 148*  K 3.9  --  3.9 3.7 5.0 4.0  --   --  3.9  CL 119*  --  118*  --  121* 121*  --   --  117*  CO2 27  --  25  --  23 24  --   --  21*  GLUCOSE 189*  --  122*  --  129* 160*  --   --  159*  BUN 67*  --  62*  --  61* 56*  --   --  54*  CREATININE 2.15*  --  2.03*  --  2.02* 2.04*  --   --  2.08*  CALCIUM 8.8*  --  8.8*  --  8.8* 8.8*  --   --  8.2*   < > = values in this interval not displayed.   GFR: Estimated Creatinine Clearance: 43 mL/min (A) (by C-G formula based on SCr of 2.08 mg/dL (H)). Recent Labs  Lab 01/16/21 0402 01/17/21 0342 01/18/21 0456 01/19/21 0343  WBC 12.6* 10.9* 8.6 10.0    Liver Function Tests: Recent Labs  Lab 01/12/21 1301 01/13/21 0712 01/14/21 0358 01/15/21 0706 01/16/21 0402  AST 56* 54* 163* 68* 58*  ALT 36 38 133* 97* 76*  ALKPHOS 73 64 105 96 89  BILITOT 0.4 0.5 0.6 0.6 0.6  PROT 5.6* 5.6* 5.6* 6.2* 6.4*  ALBUMIN 1.6* 1.6* 1.8* 2.0* 2.0*   No results for input(s): LIPASE, AMYLASE in the last 168 hours. No results for input(s): AMMONIA in the last 168 hours.  ABG    Component Value Date/Time   PHART 7.501 (H) 01/16/2021 1215   PCO2ART 36.2 01/16/2021 1215   PO2ART 110 (H) 01/16/2021 1215   HCO3 28.2 (H) 01/16/2021 1215   TCO2 29 01/16/2021 1215   ACIDBASEDEF 4.0 (H) 01/03/2021 0857   O2SAT 99.0 01/16/2021 1215     Coagulation Profile: No results for input(s): INR, PROTIME in the last 168 hours.  Cardiac Enzymes: No results for input(s): CKTOTAL, CKMB, CKMBINDEX, TROPONINI in the last 168 hours.  HbA1C: Hemoglobin A1C  Date/Time Value Ref Range Status  08/18/2017 03:42 PM 5.7 (A) 4.0 - 5.6 % Final    Comment:    CBG 134  06/10/2016 02:58 PM 6.4  Final   Hgb A1c MFr Bld  Date/Time Value Ref Range Status  12/31/2020 02:40 AM 6.3 (H) 4.8 - 5.6 % Final     Comment:    (NOTE)         Prediabetes: 5.7 - 6.4         Diabetes: >6.4         Glycemic control for adults with diabetes: <7.0   04/19/2015 06:22 PM 6.0 (H) 4.8 - 5.6 % Final    Comment:    (NOTE)         Pre-diabetes: 5.7 - 6.4         Diabetes: >6.4  Glycemic control for adults with diabetes: <7.0     CBG: Recent Labs  Lab 01/18/21 1204 01/18/21 1531 01/18/21 1937 01/18/21 2342 01/19/21 0319  GLUCAP 172* 184* 142* 150* 161*    Critical care time: n/a       Heber Maxton, MD [facility] Pager: [phone] Cell: [phone] After 7:00 pm call Elink  [phone]

## 2021-01-19 NOTE — Progress Notes (Signed)
PROGRESS NOTE  Stephen Delgado  DOB: [birth year]  PCP: Patient, [name] DPO:[id number]  DOA: 12/30/2020  LOS: 20 days  Hospital Day: 21  Chief Complaint  Patient presents with   Altered Mental Status    Brief narrative: Stephen Delgado is a 55 y.o. male with PMH significant for DM2, HTN, CKD with questionable compliance to medications Patient presented to the ED on 12/30/2020 with complaint of headache, altered mental status  In the ED, he was hypertensive to 238/160, agitated, required restraints CT head showed large amount of intraventricular hemorrhage involving lateral/third/fourth ventricle, ICH and left basal ganglia as well as chronic microvascular ischemic changes. He was started on Cleviprex drip and admitted to neuro ICU  He subsequently had CT head repeated on 11/30, 12/1 and 12/3, all demonstrated unchanged intraparenchymal hemorrhage and intraventricular hemorrhage 12/3, MRI brain from also showed numerous small acute infarcts in both cerebral hemisphere with minimal improvement in the right superior cerebellum 12/3, intubated 12/13, tracheostomy 12/15, wean from vent to trach collar 12/16, PEG tube placement 12/16, repeat MRI showed an increase in the size and number of acute infarcts in the bilateral hemispheres with increased involvement in the cerebellum bilaterally.   12/18, transferred from neuro service to [facility]   His hospital course has been complicated by significant neurological impairment, aspiration events, AKI, hypernatremia, urinary retention. Palliative care was consulted at 12/11.  Per note, family wanted to continue aggressive care as full CODE STATUS.  Subjective: Patient was seen and examined this morning.  Middle-aged African-American male.  Alert open eyes.  Unable to follow any commands.   Completely dependent on tracheostomy for breathing and PEG tube for feeding No family at bedside  Assessment/Plan: Acute intraparenchymal and  intraventricular hemorrhage Bilateral multifocal ischemic infarcts -Ischemic and hemorrhagic stroke likely from combination of uncontrolled hypertension and small vessel disease.  -no improvement in multiple repeat imagings.  The MRI from 12/16 actually showed an increase in size and number of acute infarcts. -No evidence of intracranial vascular stenosis or occlusion. -Neurology following.  Prior to admission, patient was on aspirin 81 mg daily.  Currently on no blood thinners because of intracranial hemorrhage.  Acute respiratory failure with hypoxia MSSA and serratia penumonia  -s/p tracheostomy -Trach collar in place -Currently on IV Zosyn since 12/15. -Routine trach care.   -PCCM following  Essential hypertension -Blood pressure on admission was elevated to over 200 systolic. -Currently controlled on Coreg, clonidine and minoxidil.  I will continue Coreg and clonidine.  Hold minoxidil.  Continue to monitor blood pressure.  AKI on CKD 3b -For the last few days, creatinine has been stable close to 2.  Continue to monitor. Recent Labs    01/11/21 0323 01/12/21 1301 01/13/21 0712 01/14/21 0358 01/15/21 0706 01/15/21 1116 01/16/21 0402 01/17/21 0342 01/18/21 0456 01/19/21 0343  BUN 67* 74* 79* 82* 69* 67* 62* 61* 56* 54*  CREATININE 2.98* 2.45* 2.66* 2.65* 2.11* 2.15* 2.03* 2.02* 2.04* 2.08*   Hypernatremia -Sodium level was significantly elevated up to 159 at the peak.  Partly responsible for altered mentation. -Gradually improving sodium level, 148 today, remains on dextrose drip. Recent Labs  Lab 01/15/21 0706 01/15/21 1116 01/15/21 2258 01/16/21 0402 01/16/21 1215 01/17/21 0342 01/18/21 0456 01/18/21 1230 01/18/21 1807 01/19/21 0343  NA 154* 156* 154* 155* 159* 156* 155* 154* 152* 148*   Type 2 diabetes mellitus -A1c 6.3 in 12/31/2020 -Currently on Lantus 20 units daily, aspart 5 units every 4 hours with sliding scale  insulin. -Blood sugar level  controlled. Recent Labs  Lab 01/18/21 1531 01/18/21 1937 01/18/21 2342 01/19/21 0319 01/19/21 1220  GLUCAP 184* 142* 150* 161* 142*   Protein calorie malnutrition -Continue dietary supplements   Acute urinary retention  -Continue Urecholine -Currently has Foley catheter.   Goals of care -Poor prognosis.  No neurological recovery in last several days.  In fact repeat MRI on 12/16 shows worsening stroke compared to scan on admission.  -Last seen by palliative on 12/11.  Mobility: Bedbound, Living condition: Was at home prior to this admission Goals of care:   Code Status: Full Code  Nutritional status: Body mass index is 27.21 kg/m.  Nutrition Problem: Inadequate oral intake Etiology: lethargy/confusion, dysphagia Signs/Symptoms: NPO status Diet:  Diet Order             Diet NPO time specified  Diet effective midnight                  DVT prophylaxis:  heparin injection 5,000 Units Start: 01/05/21 1400 SCD's Start: 12/30/20 1908   Antimicrobials: Zosyn IV Fluid: Dextrose 75 mL/h Consultants: Neurology, PCCM, palliative Family Communication: Called and updated patient's son [name] this afternoon.  Status is: Inpatient  Continue in-hospital care because: Not waking up Level of care: Progressive   Dispo: The patient is from: Home              Anticipated d/c is to: Unclear at this time              Patient currently is not medically stable to d/c.   Difficult to place patient No     Infusions:   dextrose 75 mL/hr at 01/19/21 0211   feeding supplement (GLUCERNA 1.2 CAL) 1,000 mL (01/18/21 1816)   piperacillin-tazobactam (ZOSYN)  IV 3.375 g (01/19/21 0523)    Scheduled Meds:  bethanechol  25 mg Per Tube TID   busPIRone  10 mg Per Tube TID   carvedilol  25 mg Per Tube BID WC   chlorhexidine  15 mL Mouth Rinse BID   Chlorhexidine Gluconate Cloth  6 each Topical Q0600   cloNIDine  0.1 mg Per Tube Q8H   feeding supplement (PROSource TF)  45  mL Per Tube Daily   free water  200 mL Per Tube Q4H   heparin injection (subcutaneous)  5,000 Units Subcutaneous Q8H   insulin aspart  0-20 Units Subcutaneous Q4H   insulin aspart  5 Units Subcutaneous Q4H   insulin detemir  20 Units Subcutaneous BID   mouth rinse  15 mL Mouth Rinse q12n4p   pantoprazole sodium  40 mg Per Tube Daily    PRN meds: acetaminophen **OR** acetaminophen (TYLENOL) oral liquid 160 mg/5 mL **OR** acetaminophen, albuterol, docusate, fentaNYL (SUBLIMAZE) injection, hydrALAZINE, labetalol, ondansetron (ZOFRAN) IV, polyethylene glycol   Antimicrobials: Anti-infectives (From admission, onward)    Start     Dose/Rate Route Frequency Ordered Stop   01/15/21 1630  piperacillin-tazobactam (ZOSYN) IVPB 3.375 g        3.375 g 12.5 mL/hr over 240 Minutes Intravenous Every 8 hours 01/15/21 0943     01/15/21 1030  piperacillin-tazobactam (ZOSYN) IVPB 3.375 g        3.375 g 100 mL/hr over 30 Minutes Intravenous  Once 01/15/21 0943 01/15/21 1035   01/05/21 1600  ceFAZolin (ANCEF) IVPB 2g/100 mL premix        2 g 200 mL/hr over 30 Minutes Intravenous Every 8 hours 01/05/21 0853 01/10/21 0848   01/03/21  0830  piperacillin-tazobactam (ZOSYN) IVPB 3.375 g  Status:  Discontinued        3.375 g 12.5 mL/hr over 240 Minutes Intravenous Every 8 hours 01/03/21 0800 01/05/21 0853       Objective: Vitals:   01/19/21 1112 01/19/21 1234  BP: 113/62 125/67  Pulse: 99 97  Resp: (!) 32 18  Temp:    SpO2: 95% 99%    Intake/Output Summary (Last 24 hours) at 01/19/2021 1435 Last data filed at 01/19/2021 0211 Gross per 24 hour  Intake 1610.92 ml  Output 3450 ml  Net -1839.08 ml   Filed Weights   01/17/21 0500 01/18/21 0344 01/19/21 0500  Weight: 86.4 kg 89.2 kg 91 kg   Weight change: 1.8 kg Body mass index is 27.21 kg/m.   Physical Exam: General exam: Middle-aged African-American male.  Comatose.  Has a trach and PEG tube Skin: No rashes, lesions or ulcers. HEENT:  Atraumatic, normocephalic, no obvious bleeding Lungs: Diminished air entry on both bases CVS: Regular rate and rhythm, no murmur GI/Abd soft, nontender, nondistended, bowel sound present CNS: Comatose, unable to open eyes on command.  Spontaneous movements seen Psychiatry: Unable to examine because of altered mental status Extremities: No pedal edema, no tenderness  Data Review: I have personally reviewed the laboratory data and studies available.  F/u labs ordered Unresulted Labs (From admission, onward)     Start     Ordered   01/16/21 [id number]  Basic metabolic panel  Daily,   R     Question:  Specimen collection method  Answer:  Lab=Lab collect   01/15/21 0818   01/07/21 0500  CBC with Differential/Platelet  Daily,   R     Question:  Specimen collection method  Answer:  Lab=Lab collect   01/06/21 0806   12/30/20 1658  CBC with Differential  Once,   R        12/30/20 1657            Signed, Terrilee Croak, MD [facility] 01/19/2021

## 2021-01-19 NOTE — Progress Notes (Addendum)
STROKE TEAM PROGRESS NOTE   INTERVAL HISTORY Patient has been having secretions with intermittent suctioning.  He is tolerating trach collar well..  He has been hemodynamically stable.  Afebrile overnight. His neurological exam remains unchanged without improvement overtime. He is still on the trach collar and breathing well. . .  No family at the bedside.  Neurological exam unchanged.  Vital signs stable  Vitals:   01/19/21 0523 01/19/21 0821 01/19/21 1112 01/19/21 1234  BP: (!) 155/86  113/62 125/67  Pulse: 95 97 99 97  Resp: (!) 22 (!) 24 (!) 32 18  Temp:      TempSrc:      SpO2:  97% 95% 99%  Weight:      Height:       CBC:  Recent Labs  Lab 01/18/21 0456 01/19/21 0343  WBC 8.6 10.0  NEUTROABS 6.7 7.4  HGB 9.2* 8.9*  HCT 29.8* 28.9*  MCV 77.4* 77.3*  PLT 404* 381   Basic Metabolic Panel:  Recent Labs  Lab 01/18/21 0456 01/18/21 1230 01/18/21 1807 01/19/21 0343  NA 155*   < > 152* 148*  K 4.0  --   --  3.9  CL 121*  --   --  117*  CO2 24  --   --  21*  GLUCOSE 160*  --   --  159*  BUN 56*  --   --  54*  CREATININE 2.04*  --   --  2.08*  CALCIUM 8.8*  --   --  8.2*   < > = values in this interval not displayed.   Lipid Panel:  Recent Labs  Lab 01/15/21 0706  TRIG 113    IMAGING past 24 hours No results found.  PHYSICAL EXAM General:  Patient is a well-developed, well-nourished middle-aged African-American male with a tracheostomy.  Respiratory: Patient with tracheostomy, on ventilator   Neurological:Patient is lying in bed unresponsive with eyes open eyes are slightly deviated tongue with left eye deviated down and out.  Does not respond to voice or sternal rub. PERRL, doll's eyes reflex present, corneal reflexes present bilaterally but weak. Cough and gag reflexes present.  Will flicker LUE to noxious stimuli but no response in other extremities.   ASSESSMENT/PLAN Mr. TERRAL COOKS is a 55 y.o. male with history of HTN (questionable compliance with  medications), DM and CKD 3 presenting with headache and altered mental status. 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  He was hypertensive on arrival to 238/160 and was given Cleviprex for BP control.  He was also agitated and required restraints.  Repeat head CT 11/30 shows unchanged IPH with IVH  Head CTs done on 12/1 and 12/3 also demonstrates unchanged IPH with IVH.  MRI/MRA completed on 12/4.patient's neurological exam continues to remain the same.  Goals of care conversation happened 12/11. Tracheostomy performed 12/13.  PEG tube placed 12/16.  Repeat MRI 12/16.  ICH score 2  ICH with IVH - left basal ganglia ICH with IVH likely secondary to hypertensive emergency  bilateral lacunar infarcts - likely small vessel disease vs. Strict BP control in the setting of ICH CT head Large basal ganglia IPH with IVH involving the lateral, third and fourth ventricles with no significant mass effect or midline shift.  Repeat CT 11/30 0116 Unchanged IPH with IVH Repeat CT head 11/30 1629  Stable IPH with IVH, stable mild hydrocephalus Repeat CT Head 12/6 - Unchanged IVH and  left caudate head intraparenchymal component. No new site of hemorrhage. 2D Echo EF [id number], grade 1 diastolic dysfunction, no atrial level shunt MRI-12/3-Numerous small acute infarcts in bilateral cerebral hemispheres. Minimal involvement of the right superior cerebellum.  Left caudate hemorrhage, intraventricular hemorrhage, and scattered sulcal subarachnoid hemorrhage as seen on recent CT imaging. Associated mild communicating hydrocephalus. Numerous chronic microhemorrhages probably secondary to hypertension. Chronic microvascular ischemic changes MRI-12/16-increase in the size and number of acute infarcts in the bilateral hemispheres with increased involvement in the cerebellum bilaterally. MRA head and neck 10/4- Mildly tortuous cervical carotid and vertebral arteries without stenosis. Intracranial  hemorrhage and ventricles appears stable from the MRI yesterday. CUS unremarkable LDL 111.4 HgbA1c 6.3 VTE prophylaxis - SCDs aspirin 81 mg daily prior to admission, now on No antithrombotic secondary to [place] Therapy recommendations:  SNF Disposition:  pending  Respiratory distress S/p tracheostomy CCM on board Tachypnea- PRN fentanyl and Versed ETT exchanged 12/4 due to a cuff leak Precedex for sedation currently running. Prop is on hold  Hypertensive emergency- improving Home meds:  Amlodipine 10 mg daily, metoprolol succinate 25 mg daily,  Unstable No longer requiring Cardene to maintain BP at goal.  SBP goal <160 Currently On coreg 25mg , Clonidine 0.3->0.2->0.1 mg q8h and minoxidil 10mg  daily Long-term BP goal normotensive  Hyperlipidemia Home meds:  none LDL 111.4, goal < 70 Hold off statin for now due to acute ICH Consider statin at discharge         AKI on CKD Cre 2.98->2.45-> 2.65-> 2.11-> 2.15 On TF Close monitoring I/O CCM on board  Hyperglycemia CBG monitoring SSI On Levemir 20 units twice daily On NovoLog 5 units q4      Dysphagia Did not pass swallow Coretrak in place with tube feed PEG tube placement 12/16  Other Stroke Risk Factors Questionable compliance with medications at home        Other Active Problems Code status - full code Hypokalemia 3.1 -> 3.4 -> 3.5-> 4.5->3.6-> 3.1-> 3.6-> 3.9->3.7 Resolved Hypernatremia- 153->152->149->148-> 144->142->143-> 145-> 156->154  - free water 200 ml Q 2 hours  - D5W at 25 mL/hr Ileus seen on KUB- fleet enema given 12/1; bowel regimen in place Aspiration pneumonia  Zosyn started 12/15 per CCM  Now afebrile WBCs  15.1->12.6 Anemia - Hb 10.1  Hospital day # 20  Patient neurological exam remains 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  His prognosis for making meaningful improvement is not good but family wants to continue aggressive care at.  Appreciate medical.  Hospitalist team taking over his care.   Discussed with Dr. Pietro Cassis Greater than 50% time during this 35-minute visit was spent in counseling and coordination of care and discussion with care team and answering questions. I spoke to the patient's son [name] over the phone and again reiterated his poor neurological exam and terrible prognosis and reviewed results of repeat MRI scan showing additional bilateral lacunar strokes.  He voiced understanding and stated family was willing to revisit talking to palliative care about goals of care.   Antony Contras, MD Medical Director [employer] Pager: [phone] 01/19/2021 2:18 PM    To contact Stroke Continuity provider, please refer to [email or web address] After hours, contact General Neurology

## 2021-01-19 NOTE — Progress Notes (Signed)
Physical Therapy Treatment Patient Details Name: Stephen Delgado MRN: [id number] DOB: Aug 23, 1965 Today's Date: 01/19/2021   History of Present Illness 55 y/o presented to [facility] ED on 11/29 for AMS, lethargy, and vomiting. Transferred to [facility]. CTH showed L caudate/BG hemorrhage with IVH. Declining respiratory status with snoring respirations. Intubated 12/3. Trach placed 12/13. PMH: HTN, sleep apnea, DM, CKD 3    PT Comments    Pt received in supine, lethargic and non-responsive to noxious stimuli, does not blink to threat. Blinds opened for improved sunlight into room to assist with orienting pt to sleep/wake cycles if he becomes more alert. Pt totalA +2 for repositioning to chair posture in bed, does not awaken when assisted to reposition. Provided pt brother who entered room during session with info on pressure relief strategies, edema reduction techniques and safe technique for gentle PROM for UE/LE. Handouts printed after session to reinforce instruction and placed in room (brother no longer in room at that time). Pt RR elevated during session to 30's rpm and VSS otherwise he would also benefit from bilateral Prevalon boots for pressure relief on LE/heels, RN notified. Pt continues to benefit from PT services to progress toward functional mobility goals.    Recommendations for follow up therapy are one component of a multi-disciplinary discharge planning process, led by the attending physician.  Recommendations may be updated based on patient status, additional functional criteria and insurance authorization.  Follow Up Recommendations  PT at Long-term acute care hospital     Assistance Recommended at Discharge Frequent or constant Supervision/Assistance  Equipment Recommendations  Other (comment) (TBD)    Recommendations for Other Services       Precautions / Restrictions Precautions Precautions: Fall Precaution Comments: G-tube, trach collar, flexiseal, foley, BUE/pedal  edema Restrictions Weight Bearing Restrictions: No     Mobility  Bed Mobility Overal bed mobility: Needs Assistance Bed Mobility: Supine to Sit     Supine to sit: Total assist;+2 for physical assistance;HOB elevated     General bed mobility comments: Total A +2 for forward positioning to chair posture, had LE in chair egress position however due to pedal edema kept LE flat and heels floated; BUE positioned on pillows for edema reduction    Transfers       General transfer comment: not attempted     Modified Rankin (Stroke Patients Only) Modified Rankin (Stroke Patients Only) Pre-Morbid Rankin Score: No significant disability Modified Rankin: Severe disability     Balance Overall balance assessment: Needs assistance Sitting-balance support: Bilateral upper extremity supported Sitting balance-Leahy Scale: Zero Sitting balance - Comments: totalA when pt pulled to long sit, pt without righting reactions and head lolling forward          Cognition Arousal/Alertness: Lethargic Behavior During Therapy: Flat affect Overall Cognitive Status: Difficult to assess       General Comments: eyes closed entire session. Unable to arouse with sternal rub and other noxious stimuli, no apparent blink to threat. unable to solicit AROM on UE or LE with repositioning/cues        Exercises Other Exercises Other Exercises: BUE PROM (wrists/elbows/shoulders) Other Exercises: B UE elevated to reduce dependent edema Other Exercises: BLE PROM: ankle pumps, heel slides x10 reps ea    General Comments General comments (skin integrity, edema, etc.): Pt with elevated RR to 30-32 rpm when repositioned to more upright posture, RN notified. SpO2, HR and BP WNL otherwise on humidified yellow wall input medical air (BP 118/78 in supine)  Pertinent Vitals/Pain Breathing: occasional labored breathing, short period of hyperventilation Negative Vocalization: none Facial Expression: smiling  or inexpressive Body Language: relaxed Consolability: no need to console PAINAD Score: 1 Pain Location:  (with noxious stimuli) Pain Descriptors / Indicators:  (no withdrawal from pain/movement) Pain Intervention(s): Limited activity within patient's tolerance;Monitored during session;Repositioned     PT Goals (current goals can now be found in the care plan section) Acute Rehab PT Goals Patient Stated Goal: unable PT Goal Formulation: Patient unable to participate in goal setting Time For Goal Achievement: 01/28/21 Potential to Achieve Goals: Poor Progress towards PT goals: Not progressing toward goals - comment    Frequency    Min 1X/week (until level of arousal improves)      PT Plan Current plan remains appropriate    Co-evaluation              AM-PAC PT "6 Clicks" Mobility   Outcome Measure  Help needed turning from your back to your side while in a flat bed without using bedrails?: Total Help needed moving from lying on your back to sitting on the side of a flat bed without using bedrails?: Total Help needed moving to and from a bed to a chair (including a wheelchair)?: Total Help needed standing up from a chair using your arms (e.g., wheelchair or bedside chair)?: Total Help needed to walk in hospital room?: Total Help needed climbing 3-5 steps with a railing? : Total 6 Click Score: 6    End of Session Equipment Utilized During Treatment: Oxygen Activity Tolerance: Patient limited by lethargy;Other (comment) (elevated RR, RN notified) Patient left: in bed;with call bell/phone within reach;with bed alarm set;with family/visitor present (brother present; heels floated) Nurse Communication: Mobility status;Other (comment) (he would benefit from bilateral prevalon boots and turning Q2H) PT Visit Diagnosis: Muscle weakness (generalized) (M62.81);Other symptoms and signs involving the nervous system (R29.898)     Time: 4193-7902 PT Time Calculation (min)  (ACUTE ONLY): 39 min  Charges:  $Therapeutic Exercise: 8-22 mins $Therapeutic Activity: 23-37 mins                     Calah Gershman P., PTA Acute Rehabilitation Services Pager: [phone] Office: [phone]    Dorathy Kinsman Olsen Mccutchan 01/19/2021, 1:20 PM

## 2021-01-19 NOTE — Progress Notes (Signed)
°   01/19/21 1941  Assess: MEWS Score  Temp 98 F (36.7 C)  BP 132/69  Pulse Rate 94  ECG Heart Rate 94  Resp (!) 25  Level of Consciousness Responds to Pain  SpO2 100 %  O2 Device Tracheostomy Collar  O2 Flow Rate (L/min) 5 L/min  Assess: MEWS Score  MEWS Temp 0  MEWS Systolic 0  MEWS Pulse 0  MEWS RR 1  MEWS LOC 2  MEWS Score 3  MEWS Score Color Yellow  Assess: if the MEWS score is Yellow or Red  Were vital signs taken at a resting state? Yes  Focused Assessment No change from prior assessment  Early Detection of Sepsis Score *See Row Information* Low  MEWS guidelines implemented *See Row Information* No, previously yellow, continue vital signs every 4 hours

## 2021-01-19 NOTE — Progress Notes (Addendum)
Palliative-   Re consult received. Patient was last seen by Palliative medicine team on 12/14.   Called patient's sonJomarie Longs. Left message requesting return call.   Ocie Bob, AGNP-C Palliative Medicine  Please call Palliative Medicine team phone with any questions [phone] between the hours of 7am-7pm.   No charge

## 2021-01-20 DIAGNOSIS — I610 Nontraumatic intracerebral hemorrhage in hemisphere, subcortical: Secondary | ICD-10-CM | POA: Diagnosis not present

## 2021-01-20 DIAGNOSIS — I619 Nontraumatic intracerebral hemorrhage, unspecified: Secondary | ICD-10-CM | POA: Diagnosis not present

## 2021-01-20 LAB — GLUCOSE, CAPILLARY
Glucose-Capillary: 102 mg/dL — ABNORMAL HIGH (ref 70–99)
Glucose-Capillary: 124 mg/dL — ABNORMAL HIGH (ref 70–99)
Glucose-Capillary: 121 mg/dL — ABNORMAL HIGH (ref 70–99)
Glucose-Capillary: 132 mg/dL — ABNORMAL HIGH (ref 70–99)
Glucose-Capillary: 132 mg/dL — ABNORMAL HIGH (ref 70–99)
Glucose-Capillary: 142 mg/dL — ABNORMAL HIGH (ref 70–99)

## 2021-01-20 LAB — CBC WITH DIFFERENTIAL/PLATELET
Abs Immature Granulocytes: 0.19 10*3/uL — ABNORMAL HIGH (ref 0.00–0.07)
Basophils Absolute: 0 10*3/uL (ref 0.0–0.1)
Basophils Relative: 0 %
Eosinophils Absolute: 0.5 10*3/uL (ref 0.0–0.5)
Eosinophils Relative: 5 %
HCT: 31.2 % — ABNORMAL LOW (ref 39.0–52.0)
Hemoglobin: 9.8 g/dL — ABNORMAL LOW (ref 13.0–17.0)
Immature Granulocytes: 2 %
Lymphocytes Relative: 14 %
Lymphs Abs: 1.4 10*3/uL (ref 0.7–4.0)
MCH: 23.8 pg — ABNORMAL LOW (ref 26.0–34.0)
MCHC: 31.4 g/dL (ref 30.0–36.0)
MCV: 75.7 fL — ABNORMAL LOW (ref 80.0–100.0)
Monocytes Absolute: 0.5 10*3/uL (ref 0.1–1.0)
Monocytes Relative: 5 %
Neutro Abs: 7.5 10*3/uL (ref 1.7–7.7)
Neutrophils Relative %: 74 %
Platelets: 360 10*3/uL (ref 150–400)
RBC: 4.12 MIL/uL — ABNORMAL LOW (ref 4.22–5.81)
RDW: 14.6 % (ref 11.5–15.5)
WBC: 10 10*3/uL (ref 4.0–10.5)
nRBC: 0 % (ref 0.0–0.2)

## 2021-01-20 LAB — BASIC METABOLIC PANEL
Anion gap: 8 (ref 5–15)
BUN: 47 mg/dL — ABNORMAL HIGH (ref 6–20)
CO2: 24 mmol/L (ref 22–32)
Calcium: 8.5 mg/dL — ABNORMAL LOW (ref 8.9–10.3)
Chloride: 114 mmol/L — ABNORMAL HIGH (ref 98–111)
Creatinine, Ser: 1.89 mg/dL — ABNORMAL HIGH (ref 0.61–1.24)
GFR, Estimated: 41 mL/min — ABNORMAL LOW (ref >60)
Glucose, Bld: 123 mg/dL — ABNORMAL HIGH (ref 70–99)
Potassium: 4.1 mmol/L (ref 3.5–5.1)
Sodium: 146 mmol/L — ABNORMAL HIGH (ref 135–145)

## 2021-01-20 MED ORDER — GLYCOPYRROLATE 1 MG PO TABS
1.0000 mg | ORAL_TABLET | Freq: Three times a day (TID) | ORAL | Status: DC
  Administered 2021-01-20 – 2021-01-22 (×7): 1 mg
  Filled 2021-01-20 (×8): qty 1

## 2021-01-20 NOTE — Progress Notes (Signed)
Occupational Therapy Treatment Patient Details Name: Stephen Delgado MRN: [id number] DOB: [birth year] Today's Date: 01/20/2021   History of present illness 55 y/o presented to [facility] ED on 11/29 for AMS, lethargy, and vomiting. Transferred to [facility]. CTH showed L caudate/BG hemorrhage with IVH. Declining respiratory status with snoring respirations. Intubated 12/3. Trach placed 12/13. PMH: HTN, sleep apnea, DM, CKD 3   OT comments  Patient in bed and lethargic. Attempts were made to arouse patient without success. Patient tolerated BUE PROM while in supine without arousal.  BUE edema noticed in BUE hands and retrograde massage and positioning performed to address. Discussed with nursing on BLE Prevalon Boots. Acute OT to continue to follow.    Recommendations for follow up therapy are one component of a multi-disciplinary discharge planning process, led by the attending physician.  Recommendations may be updated based on patient status, additional functional criteria and insurance authorization.    Follow Up Recommendations  Skilled nursing-short term rehab (<3 hours/day)    Assistance Recommended at Discharge Frequent or constant Supervision/Assistance  Equipment Recommendations  Other (comment) (TBA)    Recommendations for Other Services      Precautions / Restrictions Precautions Precautions: Fall Precaution Comments: G-tube, trach collar, flexiseal, foley, BUE/pedal edema       Mobility Bed Mobility                    Transfers                         Balance                                           ADL either performed or assessed with clinical judgement   ADL                                              Extremity/Trunk Assessment Upper Extremity Assessment Upper Extremity Assessment: Difficult to assess due to impaired cognition            Vision       Perception     Praxis      Cognition  Arousal/Alertness: Lethargic Behavior During Therapy: Flat affect Overall Cognitive Status: Difficult to assess                                 General Comments: unable to have patient open eyes following stimuli and during treatment          Exercises Exercises: General Upper Extremity;Other exercises General Exercises - Upper Extremity Shoulder Flexion: PROM;Both;10 reps;Supine Shoulder Extension: PROM;10 reps;Both;Supine Shoulder ABduction: PROM;Both;10 reps;Supine Shoulder ADduction: PROM;Both;10 reps;Supine Elbow Flexion: PROM;Both;10 reps;Supine Elbow Extension: PROM;Both;10 reps;Supine Wrist Flexion: PROM;Both;10 reps;Supine Wrist Extension: PROM;Both;10 reps;Supine Digit Composite Flexion: PROM;Both;10 reps;Supine Composite Extension: PROM;Both;10 reps;Supine Other Exercises Other Exercises: retro grade massage to BUE hands due to edema   Shoulder Instructions       General Comments      Pertinent Vitals/ Pain       Pain Assessment: Faces Faces Pain Scale: No hurt  Home Living  Prior Functioning/Environment              Frequency  Min 1X/week        Progress Toward Goals  OT Goals(current goals can now be found in the care plan section)  Progress towards OT goals: Not progressing toward goals - comment (not progressing due to level of arousal)  Acute Rehab OT Goals OT Goal Formulation: Patient unable to participate in goal setting Time For Goal Achievement: 01/30/21 Potential to Achieve Goals: Fair ADL Goals Additional ADL Goal #1: Pt will follow 1 step commands 1/4 times as precursor for ADL tasks  Plan Discharge plan remains appropriate    Co-evaluation                 AM-PAC OT "6 Clicks" Daily Activity     Outcome Measure   Help from another person eating meals?: Total Help from another person taking care of personal grooming?: Total Help from another  person toileting, which includes using toliet, bedpan, or urinal?: Total Help from another person bathing (including washing, rinsing, drying)?: Total Help from another person to put on and taking off regular upper body clothing?: Total Help from another person to put on and taking off regular lower body clothing?: Total 6 Click Score: 6    End of Session    OT Visit Diagnosis: Other abnormalities of gait and mobility (R26.89);Muscle weakness (generalized) (M62.81);Low vision, both eyes (H54.2);Other symptoms and signs involving the nervous system (R29.898);Other symptoms and signs involving cognitive function;Cognitive communication deficit (R41.841) Symptoms and signs involving cognitive functions: Nontraumatic SAH;Other Nontraumatic ICH   Activity Tolerance Patient limited by lethargy   Patient Left in bed;with call bell/phone within reach;with bed alarm set   Nurse Communication Other (comment) (positioning in bed for BUE and need for BLE Prevalon Boots)        Time: [id number] OT Time Calculation (min): 23 min  Charges: OT General Charges $OT Visit: 1 Visit OT Treatments $Neuromuscular Re-education: 23-37 mins  Lodema Hong, [place]  Pager [phone] Office [place] 01/20/2021, 9:39 AM

## 2021-01-20 NOTE — Progress Notes (Signed)
STROKE TEAM PROGRESS NOTE   INTERVAL HISTORY Patient neurological exam remains unchanged.Marland Kitchen  He is tolerating trach collar well..  He has been hemodynamically stable.     No family at the bedside.  Neurological exam unchanged.  Vital signs stable.  Family has agreed to meet with palliative care and meeting is scheduled for tomorrow afternoon.   Vitals:   01/20/21 0736 01/20/21 0828 01/20/21 0834 01/20/21 1235  BP: (!) 116/56 136/66 (!) 147/82 124/63  Pulse: 93 93 97 91  Resp: (!) 27 15 (!) 28 (!) 28  Temp:  99.4 F (37.4 C)  99.2 F (37.3 C)  TempSrc:  Oral  Oral  SpO2:  95% 91% 96%  Weight:      Height:       CBC:  Recent Labs  Lab 01/19/21 0343 01/20/21 0246  WBC 10.0 10.0  NEUTROABS 7.4 7.5  HGB 8.9* 9.8*  HCT 28.9* 31.2*  MCV 77.3* 75.7*  PLT 381 360   Basic Metabolic Panel:  Recent Labs  Lab 01/19/21 0343 01/20/21 0246  NA 148* 146*  K 3.9 4.1  CL 117* 114*  CO2 21* 24  GLUCOSE 159* 123*  BUN 54* 47*  CREATININE 2.08* 1.89*  CALCIUM 8.2* 8.5*   Lipid Panel:  Recent Labs  Lab 01/15/21 0706  TRIG 113    IMAGING past 24 hours No results found.  PHYSICAL EXAM General:  Patient is a well-developed, well-nourished middle-aged African-American male with a tracheostomy.  Respiratory: Patient with tracheostomy, on ventilator   Neurological:Patient is lying in bed unresponsive with eyes open eyes are slightly deviated tongue with left eye deviated down and out.  Does not respond to voice or sternal rub. PERRL, doll's eyes reflex present, corneal reflexes present bilaterally but weak. Cough and gag reflexes present.  Will flicker LUE to noxious stimuli but no response in other extremities.   ASSESSMENT/PLAN Stephen Delgado is a 55 y.o. male with history of HTN (questionable compliance with medications), DM and CKD 3 presenting with headache and altered mental status. 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  He was  hypertensive on arrival to 238/160 and was given Cleviprex for BP control.  He was also agitated and required restraints.  Repeat head CT 11/30 shows unchanged IPH with IVH  Head CTs done on 12/1 and 12/3 also demonstrates unchanged IPH with IVH.  MRI/MRA completed on 12/4.patient's neurological exam continues to remain the same.  Goals of care conversation happened 12/11. Tracheostomy performed 12/13.  PEG tube placed 12/16.  Repeat MRI 12/16.  ICH score 2  ICH with IVH - left basal ganglia ICH with IVH likely secondary to hypertensive emergency  bilateral lacunar infarcts - likely small vessel disease vs. Strict BP control in the setting of ICH CT head Large basal ganglia IPH with IVH involving the lateral, third and fourth ventricles with no significant mass effect or midline shift.  Repeat CT 11/30 0116 Unchanged IPH with IVH Repeat CT head 11/30 1629  Stable IPH with IVH, stable mild hydrocephalus Repeat CT Head 12/6 - Unchanged IVH and left caudate head intraparenchymal component. No new site of hemorrhage. 2D Echo EF 50-55%, grade 1 diastolic dysfunction, no atrial level shunt MRI-12/3-Numerous small acute infarcts in bilateral cerebral hemispheres. Minimal involvement of the right superior cerebellum.  Left caudate hemorrhage, intraventricular hemorrhage, and scattered sulcal subarachnoid hemorrhage as seen on recent CT imaging. Associated mild communicating hydrocephalus. Numerous chronic microhemorrhages  probably secondary to hypertension. Chronic microvascular ischemic changes MRI-12/16-increase in the size and number of acute infarcts in the bilateral hemispheres with increased involvement in the cerebellum bilaterally. MRA head and neck 10/4- Mildly tortuous cervical carotid and vertebral arteries without stenosis. Intracranial hemorrhage and ventricles appears stable from the MRI yesterday. CUS unremarkable LDL 111.4 HgbA1c 6.3 VTE prophylaxis - SCDs aspirin 81 mg daily prior to  admission, now on No antithrombotic secondary to [place] Therapy recommendations:  SNF Disposition:  pending  Respiratory distress S/p tracheostomy CCM on board Tachypnea- PRN fentanyl and Versed ETT exchanged 12/4 due to a cuff leak Precedex for sedation currently running. Prop is on hold  Hypertensive emergency- improving Home meds:  Amlodipine 10 mg daily, metoprolol succinate 25 mg daily,  Unstable No longer requiring Cardene to maintain BP at goal.  SBP goal <160 Currently On coreg 25mg , Clonidine 0.3->0.2->0.1 mg q8h and minoxidil 10mg  daily Long-term BP goal normotensive  Hyperlipidemia Home meds:  none LDL 111.4, goal < 70 Hold off statin for now due to acute ICH Consider statin at discharge         AKI on CKD Cre 2.98->2.45-> 2.65-> 2.11-> 2.15 On TF Close monitoring I/O CCM on board  Hyperglycemia CBG monitoring SSI On Levemir 20 units twice daily On NovoLog 5 units q4      Dysphagia Did not pass swallow Coretrak in place with tube feed PEG tube placement 12/16  Other Stroke Risk Factors Questionable compliance with medications at home        Other Active Problems Code status - full code Hypokalemia 3.1 -> 3.4 -> 3.5-> 4.5->3.6-> 3.1-> 3.6-> 3.9->3.7 Resolved Hypernatremia- 153->152->149->148-> 144->142->143-> 145-> 156->154  - free water 200 ml Q 2 hours  - D5W at 25 mL/hr Ileus seen on KUB- fleet enema given 12/1; bowel regimen in place Aspiration pneumonia  Zosyn started 12/15 per CCM  Now afebrile WBCs  15.1->12.6 Anemia - Hb 10.1  Hospital day # 21  Patient neurological exam remains 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  His prognosis for making meaningful improvement is not good but family wants to continue aggressive care at. .  Discussion about goals of care and comfort care is appropriate palliative care plan on meeting with family tomorrow to discuss the same.  Stroke team will be available to answer any questions that the family may have  if needed.  Discussed with Dr. Pietro Cassis.  Greater than 50% time during this 25-minute visit was spent in counseling and coordination of care and discussion with care team and answering questions. I spoke to the patient's son Stephen Delgado over the phone yesterday and again reiterated his poor neurological exam and terrible prognosis and reviewed results of repeat MRI scan showing additional bilateral lacunar strokes.  He voiced understanding and stated family was willing to revisit talking to palliative care about goals of care.   Antony Contras, MD Medical Director [place] Pager: [phone] 01/20/2021 3:15 PM    To contact Stroke Continuity provider, please refer to [email or web address] After hours, contact General Neurology

## 2021-01-20 NOTE — Progress Notes (Addendum)
Nutrition Follow-up  DOCUMENTATION CODES:   Not applicable  INTERVENTION:  Continue TF via PEG: Glucerna 1.2 @ 70 ml/hr (1680 ml/day) ProSource TF 45 ml daily 240m free water  Q4H   Tube feeding regimen provides 2056 kcal, 111 grams of protein, and 1362 ml of H2O (252 total free water).  NUTRITION DIAGNOSIS:   Inadequate oral intake related to lethargy/confusion, dysphagia as evidenced by NPO status.  ongoing  GOAL:   Patient will meet greater than or equal to 90% of their needs  Met with TF  MONITOR:   Diet advancement, Labs, Weight trends  REASON FOR ASSESSMENT:   Ventilator, Consult Enteral/tube feeding initiation and management  ASSESSMENT:   55year old male who presented to the ED on 11/29 with AMS. PMH of HTN, T2DM, CKD stage III. 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  11/30 cortrak placed; tip gastric 12/2 trickle TF 12/3 pt intubated due to increased WOB, +emesis, TF held 12/4 trickle TF restarted 12/5 advancement orders placed 12/9 TF adjusted 12/13 s/p tracheostomy 12/16 s/p EGD and PEG placement 12[facility]  Pt tx to floor without issue yesterday. Per CCM, pt w/ poor neurological prognosis and not anticipating return to any degree of functional baseline. CCM recommending PMT consultation again (they were consulted but signed off initially before the MRI findings from late last week showed extensive ischemic disease of the brain).   UOP: [id number] hours I/O: [id number] admit  Current weight: 91.8 kg Admit weight: 85.2 kg   Edema: moderate pitting to BUE and RLE, mild pitting to LLE per RN assessment  Medications: Scheduled Meds:  bethanechol  25 mg Per Tube TID   busPIRone  10 mg Per Tube TID   carvedilol  25 mg Per Tube BID WC   chlorhexidine  15 mL Mouth Rinse BID   Chlorhexidine Gluconate Cloth  6 each Topical Q0600   cloNIDine  0.1 mg Per Tube Q8H    feeding supplement (PROSource TF)  45 mL Per Tube Daily   free water  200 mL Per Tube Q4H   heparin injection (subcutaneous)  5,000 Units Subcutaneous Q8H   insulin aspart  0-20 Units Subcutaneous Q4H   insulin aspart  5 Units Subcutaneous Q4H   insulin detemir  20 Units Subcutaneous BID   mouth rinse  15 mL Mouth Rinse q12n4p   pantoprazole sodium  40 mg Per Tube Daily  Continuous Infusions:  dextrose 75 mL/hr at 01/20/21 0351   feeding supplement (GLUCERNA 1.2 CAL) 1,000 mL (01/20/21 0205)   piperacillin-tazobactam (ZOSYN)  IV 3.375 g (01/20/21 0603)   Labs: Recent Labs  Lab 01/18/21 0456 01/18/21 1230 01/18/21 1807 01/19/21 0343 01/20/21 0246  NA 155*   < > 152* 148* 146*  K 4.0  --   --  3.9 4.1  CL 121*  --   --  117* 114*  CO2 24  --   --  21* 24  BUN 56*  --   --  54* 47*  CREATININE 2.04*  --   --  2.08* 1.89*  CALCIUM 8.8*  --   --  8.2* 8.5*  GLUCOSE 160*  --   --  159* 123*   < > = values in this interval not displayed.  CBGs: 102-153 x24 hours   Diet Order:   Diet Order             Diet NPO time specified  Diet effective midnight                   EDUCATION NEEDS:   Not appropriate for education at this time  Skin:  Skin Assessment: Reviewed RN Assessment  Last BM:  12/19 via rectal tube  Height:   Ht Readings from Last 1 Encounters:  01/03/21 6' (1.829 m)    Weight:   Wt Readings from Last 1 Encounters:  01/20/21 91.8 kg   BMI:  Body mass index is 27.45 kg/m.  Estimated Nutritional Needs:   Kcal:  2000-2200  Protein:  100-115 grams  Fluid:  >/= 2.0 L     Aliya Sol A., MS, RD, LDN (she/her/hers) RD pager number and weekend/on-call pager number located in [place].

## 2021-01-20 NOTE — Progress Notes (Signed)
Palliative-   Plan to meet family tomorrow afternoon at 4pm.   Ocie Bob, [facility] Palliative Medicine  No charge

## 2021-01-20 NOTE — Progress Notes (Signed)
PROGRESS NOTE  Stephen Delgado  DOB: 01-Jun-1965  PCP: Patient, [name] BZJ:[id number]  DOA: 12/30/2020  LOS: 21 days  Hospital Day: 22  Chief Complaint  Patient presents with   Altered Mental Status    Brief narrative: Stephen Delgado is a 55 y.o. male with PMH significant for DM2, HTN, CKD with questionable compliance to medications Patient presented to the ED on 12/30/2020 with complaint of headache, altered mental status  In the ED, he was hypertensive to 238/160, agitated, required restraints CT head showed large amount of intraventricular hemorrhage involving lateral/third/fourth ventricle, ICH and left basal ganglia as well as chronic microvascular ischemic changes. He was started on Cleviprex drip and admitted to neuro ICU  He subsequently had CT head repeated on 11/30, 12/1 and 12/3, all demonstrated unchanged intraparenchymal hemorrhage and intraventricular hemorrhage 12/3, MRI brain from also showed numerous small acute infarcts in both cerebral hemisphere with minimal improvement in the right superior cerebellum 12/3, intubated 12/13, tracheostomy 12/15, wean from vent to trach collar 12/16, PEG tube placement 12/16, repeat MRI showed an increase in the size and number of acute infarcts in the bilateral hemispheres with increased involvement in the cerebellum bilaterally.   12/18, transferred from neuro service to [facility]   His hospital course has been complicated by significant neurological impairment, aspiration events, AKI, hypernatremia, urinary retention. Palliative care was consulted at 12/11.  Per note, family wanted to continue aggressive care as full CODE STATUS.  Subjective: Patient was seen and examined this morning.  No change in last 24 hours.  Remains on comatose on tracheostomy tube, PEG tube feeding. Family not at bedside.  Assessment/Plan: Acute intraparenchymal and intraventricular hemorrhage Bilateral multifocal ischemic  infarcts -Ischemic and hemorrhagic stroke likely from combination of uncontrolled hypertension and small vessel disease.  -no improvement in multiple repeat imagings.  The MRI from 12/16 actually showed an increase in size and number of acute infarcts. -No evidence of intracranial vascular stenosis or occlusion. -Neurology following.  Prior to admission, patient was on aspirin 81 mg daily.  Currently on no blood thinners because of intracranial hemorrhage.  Acute respiratory failure with hypoxia MSSA and serratia penumonia  -s/p tracheostomy -Trach collar in place -Currently on IV Zosyn since 12/15. -Routine trach care.   -PCCM following  Essential hypertension -Blood pressure on admission was elevated to over 200 systolic. -Currently controlled on Coreg and clonidine.  Continue to monitor blood pressure.  AKI on CKD 3b -For the last few days, creatinine has been stable close to 2.  Continue to monitor.  100 and Recent Labs    01/12/21 1301 01/13/21 0712 01/14/21 0358 01/15/21 0706 01/15/21 1116 01/16/21 0402 01/17/21 0342 01/18/21 0456 01/19/21 0343 01/20/21 0246  BUN 74* 79* 82* 69* 67* 62* 61* 56* 54* 47*  CREATININE 2.45* 2.66* 2.65* 2.11* 2.15* 2.03* 2.02* 2.04* 2.08* 1.89*    Hypernatremia -Sodium level was significantly elevated up to 159 at the peak.  Partly responsible for altered mentation. -Gradually improving sodium level, 146 today, remains on dextrose drip. Recent Labs  Lab 01/15/21 1116 01/15/21 2258 01/16/21 0402 01/16/21 1215 01/17/21 0342 01/18/21 0456 01/18/21 1230 01/18/21 1807 01/19/21 0343 01/20/21 0246  NA 156* 154* 155* 159* 156* 155* 154* 152* 148* 146*    Type 2 diabetes mellitus -A1c 6.3 in 12/31/2020 -Currently on Lantus 20 units daily, aspart 5 units every 4 hours with sliding scale insulin. -Blood sugar level controlled. Recent Labs  Lab 01/19/21 1944 01/19/21 2305 01/20/21 0403 01/20/21  5638 01/20/21 1244  GLUCAP 153*  153* 102* 124* 121*    Protein calorie malnutrition -Continue dietary supplements   Acute urinary retention  -Continue Urecholine -Currently has Foley catheter.   Goals of care -Poor prognosis.  No neurological recovery in last several days.  In fact repeat MRI on 12/16 shows worsening stroke compared to scan on admission.  -Last seen by palliative on 12/11.  Mobility: Bedbound, Living condition: Was at home prior to this admission Goals of care:   Code Status: Full Code  Nutritional status: Body mass index is 27.45 kg/m.  Nutrition Problem: Inadequate oral intake Etiology: lethargy/confusion, dysphagia Signs/Symptoms: NPO status Diet:  Diet Order             Diet NPO time specified  Diet effective midnight                  DVT prophylaxis:  heparin injection 5,000 Units Start: 01/05/21 1400 SCD's Start: 12/30/20 1908   Antimicrobials: Zosyn IV till 12/21 Fluid: Dextrose 75 mL/h Consultants: Neurology, PCCM, palliative Family Communication: Called and updated patient's son Mr. Fredrich Cory on 12/19.  Noted a plan for family meeting with palliative tomorrow 12/21.  Status is: Inpatient  Continue in-hospital care because: Not waking up Level of care: Progressive   Dispo: The patient is from: Home              Anticipated d/c is to: Unclear at this time.  Hospice has been recommended.              Patient currently is not medically stable to d/c.   Difficult to place patient No     Infusions:   dextrose 75 mL/hr at 01/20/21 0351   feeding supplement (GLUCERNA 1.2 CAL) 1,000 mL (01/20/21 0205)   piperacillin-tazobactam (ZOSYN)  IV 3.375 g (01/20/21 1316)    Scheduled Meds:  bethanechol  25 mg Per Tube TID   busPIRone  10 mg Per Tube TID   carvedilol  25 mg Per Tube BID WC   chlorhexidine  15 mL Mouth Rinse BID   Chlorhexidine Gluconate Cloth  6 each Topical Q0600   cloNIDine  0.1 mg Per Tube Q8H   feeding supplement (PROSource TF)  45 mL Per Tube  Daily   free water  200 mL Per Tube Q4H   glycopyrrolate  1 mg Per Tube TID   heparin injection (subcutaneous)  5,000 Units Subcutaneous Q8H   insulin aspart  0-20 Units Subcutaneous Q4H   insulin aspart  5 Units Subcutaneous Q4H   insulin detemir  20 Units Subcutaneous BID   mouth rinse  15 mL Mouth Rinse q12n4p   pantoprazole sodium  40 mg Per Tube Daily    PRN meds: acetaminophen **OR** acetaminophen (TYLENOL) oral liquid 160 mg/5 mL **OR** acetaminophen, albuterol, docusate, fentaNYL (SUBLIMAZE) injection, hydrALAZINE, labetalol, ondansetron (ZOFRAN) IV, polyethylene glycol   Antimicrobials: Anti-infectives (From admission, onward)    Start     Dose/Rate Route Frequency Ordered Stop   01/15/21 1630  piperacillin-tazobactam (ZOSYN) IVPB 3.375 g        3.375 g 12.5 mL/hr over 240 Minutes Intravenous Every 8 hours 01/15/21 0943 01/21/21 2359   01/15/21 1030  piperacillin-tazobactam (ZOSYN) IVPB 3.375 g        3.375 g 100 mL/hr over 30 Minutes Intravenous  Once 01/15/21 0943 01/15/21 1035   01/05/21 1600  ceFAZolin (ANCEF) IVPB 2g/100 mL premix        2 g 200 mL/hr over 30  Minutes Intravenous Every 8 hours 01/05/21 0853 01/10/21 0848   01/03/21 0830  piperacillin-tazobactam (ZOSYN) IVPB 3.375 g  Status:  Discontinued        3.375 g 12.5 mL/hr over 240 Minutes Intravenous Every 8 hours 01/03/21 0800 01/05/21 0853       Objective: Vitals:   01/20/21 0834 01/20/21 1235  BP: (!) 147/82 124/63  Pulse: 97 91  Resp: (!) 28 (!) 28  Temp:  99.2 F (37.3 C)  SpO2: 91% 96%    Intake/Output Summary (Last 24 hours) at 01/20/2021 1410 Last data filed at 01/20/2021 0603 Gross per 24 hour  Intake 6085.39 ml  Output 3400 ml  Net 2685.39 ml    Filed Weights   01/18/21 0344 01/19/21 0500 01/20/21 0341  Weight: 89.2 kg 91 kg 91.8 kg   Weight change: 0.8 kg Body mass index is 27.45 kg/m.   Physical Exam: General exam: Middle-aged African-American male.  Comatose.  Has a trach  and PEG tube Skin: No rashes, lesions or ulcers. HEENT: Atraumatic, normocephalic, no obvious bleeding Lungs: Diminished air entry on both bases CVS: Regular rate and rhythm, no murmur GI/Abd soft, nontender, nondistended, bowel sound present CNS: Comatose, unable to open eyes on command.  Spontaneous movements seen Psychiatry: Unable to examine because of altered mental status Extremities: No pedal edema, no tenderness  Data Review: I have personally reviewed the laboratory data and studies available.  F/u labs ordered Unresulted Labs (From admission, onward)     Start     Ordered   01/07/21 0500  CBC with Differential/Platelet  Daily,   R     Question:  Specimen collection method  Answer:  Lab=Lab collect   01/06/21 0806   12/30/20 1658  CBC with Differential  Once,   R        12/30/20 1657            Signed, Lorin Glass, MD [facility] 01/20/2021

## 2021-01-20 NOTE — Progress Notes (Signed)
Palliative-   Called and spoke with patient's spouse in efforts to arrange a time to meet. She is going to speak with her son [name] who is at work today and return call. Proposed to meet tomorrow morning at 10 am.   Ocie Bob, AGNP-C Palliative Medicine  No charge

## 2021-01-21 DIAGNOSIS — Z7189 Other specified counseling: Secondary | ICD-10-CM | POA: Diagnosis not present

## 2021-01-21 DIAGNOSIS — J9601 Acute respiratory failure with hypoxia: Secondary | ICD-10-CM | POA: Diagnosis not present

## 2021-01-21 DIAGNOSIS — I610 Nontraumatic intracerebral hemorrhage in hemisphere, subcortical: Secondary | ICD-10-CM | POA: Diagnosis not present

## 2021-01-21 LAB — CBC WITH DIFFERENTIAL/PLATELET
Abs Immature Granulocytes: 0.16 10*3/uL — ABNORMAL HIGH (ref 0.00–0.07)
Basophils Absolute: 0 10*3/uL (ref 0.0–0.1)
Basophils Relative: 0 %
Eosinophils Absolute: 0.4 10*3/uL (ref 0.0–0.5)
Eosinophils Relative: 4 %
HCT: 29.8 % — ABNORMAL LOW (ref 39.0–52.0)
Hemoglobin: 9.4 g/dL — ABNORMAL LOW (ref 13.0–17.0)
Immature Granulocytes: 2 %
Lymphocytes Relative: 15 %
Lymphs Abs: 1.4 10*3/uL (ref 0.7–4.0)
MCH: 23.7 pg — ABNORMAL LOW (ref 26.0–34.0)
MCHC: 31.5 g/dL (ref 30.0–36.0)
MCV: 75.3 fL — ABNORMAL LOW (ref 80.0–100.0)
Monocytes Absolute: 0.5 10*3/uL (ref 0.1–1.0)
Monocytes Relative: 5 %
Neutro Abs: 7.2 10*3/uL (ref 1.7–7.7)
Neutrophils Relative %: 74 %
Platelets: 331 10*3/uL (ref 150–400)
RBC: 3.96 MIL/uL — ABNORMAL LOW (ref 4.22–5.81)
RDW: 14.4 % (ref 11.5–15.5)
WBC: 9.7 10*3/uL (ref 4.0–10.5)
nRBC: 0 % (ref 0.0–0.2)

## 2021-01-21 LAB — GLUCOSE, CAPILLARY
Glucose-Capillary: 102 mg/dL — ABNORMAL HIGH (ref 70–99)
Glucose-Capillary: 110 mg/dL — ABNORMAL HIGH (ref 70–99)
Glucose-Capillary: 118 mg/dL — ABNORMAL HIGH (ref 70–99)
Glucose-Capillary: 121 mg/dL — ABNORMAL HIGH (ref 70–99)
Glucose-Capillary: 131 mg/dL — ABNORMAL HIGH (ref 70–99)
Glucose-Capillary: 133 mg/dL — ABNORMAL HIGH (ref 70–99)
Glucose-Capillary: 141 mg/dL — ABNORMAL HIGH (ref 70–99)

## 2021-01-21 MED ORDER — INFLUENZA VAC SPLIT QUAD 0.5 ML IM SUSY
0.5000 mL | PREFILLED_SYRINGE | INTRAMUSCULAR | Status: DC | PRN
  Filled 2021-01-21: qty 0.5

## 2021-01-21 NOTE — Progress Notes (Signed)
Removed sutures from patient's trach and in doing so accidentally cut pilot balloon. Cuff was deflated upon inspection prior to suture removal. Changed trach ties, dressing and IC at same time.  Notified CCM Dr Wynona Neat about pilot balloon and ok to leave trach as is for now.

## 2021-01-21 NOTE — Progress Notes (Signed)
STROKE TEAM PROGRESS NOTE   INTERVAL HISTORY Patient neurological exam remains unchanged.Marland Kitchen  He is tolerating trach collar well..  He has been hemodynamically stable.     His wife is at the bedside.  Neurological exam unchanged.  Vital signs stable.  Family has agreed to meet with palliative care and meeting is scheduled for today afternoon.   Vitals:   01/21/21 0742 01/21/21 0805 01/21/21 1110 01/21/21 1143  BP: (!) 159/91 (!) 147/72 136/73 137/73  Pulse: 100 87 89 80  Resp: (!) 29 (!) 22 (!) 33 20  Temp:    99.3 F (37.4 C)  TempSrc:    Axillary  SpO2: 97% 97% 95% 99%  Weight:      Height:       CBC:  Recent Labs  Lab 01/20/21 0246 01/21/21 0232  WBC 10.0 9.7  NEUTROABS 7.5 7.2  HGB 9.8* 9.4*  HCT 31.2* 29.8*  MCV 75.7* 75.3*  PLT 360 331   Basic Metabolic Panel:  Recent Labs  Lab 01/19/21 0343 01/20/21 0246  NA 148* 146*  K 3.9 4.1  CL 117* 114*  CO2 21* 24  GLUCOSE 159* 123*  BUN 54* 47*  CREATININE 2.08* 1.89*  CALCIUM 8.2* 8.5*   Lipid Panel:  Recent Labs  Lab 01/15/21 0706  TRIG 113    IMAGING past 24 hours No results found.  PHYSICAL EXAM General:  Patient is a well-developed, well-nourished middle-aged African-American male with a tracheostomy.  Respiratory: Patient with tracheostomy, on ventilator   Neurological:Patient is lying in bed unresponsive with eyes open eyes are slightly deviated tongue with left eye deviated down and out.  Does not respond to voice or sternal rub. PERRL, doll's eyes reflex present, corneal reflexes present bilaterally but weak. Cough and gag reflexes present.  Will flicker LUE to noxious stimuli but no response in other extremities.   ASSESSMENT/PLAN Stephen Delgado is a 55 y.o. male with history of HTN (questionable compliance with medications), DM and CKD 3 presenting with headache and altered mental status. 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  He was hypertensive on arrival  to 238/160 and was given Cleviprex for BP control.  He was also agitated and required restraints.  Repeat head CT 11/30 shows unchanged IPH with IVH  Head CTs done on 12/1 and 12/3 also demonstrates unchanged IPH with IVH.  MRI/MRA completed on 12/4.patient's neurological exam continues to remain the same.  Goals of care conversation happened 12/11. Tracheostomy performed 12/13.  PEG tube placed 12/16.  Repeat MRI 12/16.  ICH score 2  ICH with IVH - left basal ganglia ICH with IVH likely secondary to hypertensive emergency  bilateral lacunar infarcts - likely small vessel disease vs. Strict BP control in the setting of ICH CT head Large basal ganglia IPH with IVH involving the lateral, third and fourth ventricles with no significant mass effect or midline shift.  Repeat CT 11/30 0116 Unchanged IPH with IVH Repeat CT head 11/30 1629  Stable IPH with IVH, stable mild hydrocephalus Repeat CT Head 12/6 - Unchanged IVH and left caudate head intraparenchymal component. No new site of hemorrhage. 2D Echo EF 50-55%, grade 1 diastolic dysfunction, no atrial level shunt MRI-12/3-Numerous small acute infarcts in bilateral cerebral hemispheres. Minimal involvement of the right superior cerebellum.  Left caudate hemorrhage, intraventricular hemorrhage, and scattered sulcal subarachnoid hemorrhage as seen on recent CT imaging. Associated mild communicating hydrocephalus. Numerous chronic microhemorrhages probably secondary  to hypertension. Chronic microvascular ischemic changes MRI-12/16-increase in the size and number of acute infarcts in the bilateral hemispheres with increased involvement in the cerebellum bilaterally. MRA head and neck 10/4- Mildly tortuous cervical carotid and vertebral arteries without stenosis. Intracranial hemorrhage and ventricles appears stable from the MRI yesterday. CUS unremarkable LDL 111.4 HgbA1c 6.3 VTE prophylaxis - SCDs aspirin 81 mg daily prior to admission, now on No  antithrombotic secondary to [place] Therapy recommendations:  SNF Disposition:  pending  Respiratory distress S/p tracheostomy CCM on board Tachypnea- PRN fentanyl and Versed ETT exchanged 12/4 due to a cuff leak Precedex for sedation currently running. Prop is on hold  Hypertensive emergency- improving Home meds:  Amlodipine 10 mg daily, metoprolol succinate 25 mg daily,  Unstable No longer requiring Cardene to maintain BP at goal.  SBP goal <160 Currently On coreg 25mg , Clonidine 0.3->0.2->0.1 mg q8h and minoxidil 10mg  daily Long-term BP goal normotensive  Hyperlipidemia Home meds:  none LDL 111.4, goal < 70 Hold off statin for now due to acute ICH Consider statin at discharge         AKI on CKD Cre 2.98->2.45-> 2.65-> 2.11-> 2.15 On TF Close monitoring I/O CCM on board  Hyperglycemia CBG monitoring SSI On Levemir 20 units twice daily On NovoLog 5 units q4      Dysphagia Did not pass swallow Coretrak in place with tube feed PEG tube placement 12/16  Other Stroke Risk Factors Questionable compliance with medications at home        Other Active Problems Code status - full code Hypokalemia 3.1 -> 3.4 -> 3.5-> 4.5->3.6-> 3.1-> 3.6-> 3.9->3.7 Resolved Hypernatremia- 153->152->149->148-> 144->142->143-> 145-> 156->154  - free water 200 ml Q 2 hours  - D5W at 25 mL/hr Ileus seen on KUB- fleet enema given 12/1; bowel regimen in place Aspiration pneumonia  Zosyn started 12/15 per CCM  Now afebrile WBCs  15.1->12.6 Anemia - Hb 10.1  Hospital day # 22  Patient neurological exam remains 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  His prognosis for making meaningful improvement is not good but family wants to continue aggressive care at. .  Discussion about goals of care and comfort care is appropriate palliative care plan on meeting with family today to discuss the same.  Stroke team will be available to answer any questions that the family may have if needed.  Discussed  with Dr. Pietro Cassis.  Long discussion with patient's wife at the bedside and answered questions about his condition.  Greater than 50% time during this 25-minute visit was spent in counseling and coordination of care and discussion with care team and answering questions.    Antony Contras, MD Medical Director [facility] Pager: [phone] 01/21/2021 2:29 PM    To contact Stroke Continuity provider, please refer to [email or web address] After hours, contact General Neurology

## 2021-01-21 NOTE — Progress Notes (Signed)
NAME:  Stephen Delgado, MRN:  [id number], DOB:  1965/08/16, LOS: 22 ADMISSION DATE:  12/30/2020, CONSULTATION DATE:  12/19 REFERRING MD:  Roda Shutters, CHIEF COMPLAINT:  Dyspnea   History of Present Illness:  55 yo male presented with headache, nausea and altered mental status.  CT head showed acute Lt ICH with IVH likely related to hypertensive emergency (BP 195/116).  Had persistent hypertension and concern for airway protection, and PCCM asked to assist with ICU management.  Tracheostomy 12/13.  Moved out of ICU.   Pertinent  Medical History  HTN, DM type 2, CKD 3a, OSA  Significant Hospital Events: Including procedures, antibiotic start and stop dates in addition to other pertinent events   11/29 Admit, start cleviprex 12/01 PCCM consulted; changed from cleviprex to cardene due to elevated triglycerides 12/03 intubated; episode of vomiting >> tube feeds held 12/04 resume trickle tube feeds 12/06 remains minimally responsive. Tolerating SBT.  12/12 no acute events overnight, T-max 102 point 12/13 bedside trach planned midmorning  12/14 ATC. Lasix --> 5L UOP  12/15 cont on trach collar. Na to 154 from 145. Adding low rate d5  12/16 scheduled for PEG. Cont D5. Continues on trach collar Out of ICU 12/19  Imaging/Studies: CT head 11/29 >> large amount of IVH involving lateral/3rd/4th ventricles, ICH in Lt BG, chronic microvascular ischemic changes Echo 11/30 >> EF 50 to 55%, mod LVH, grade 1 DD, ascending aorta 37 mm EEG 12/01 >> continuous generalized slowing MRI brain 12/03 >> numerous small acute infarcts in b/l cerebral hemispheres, Lt caudate hemorrhage, IVH, scattered sulcal SAH, mild communicating hydrocephalus 12/5 Started on free water for hypernatremia. Cefazolin for MSSA pneumonia.  Interim History / Subjective:   No overnight events Objective   Blood pressure 137/73, pulse 80, temperature 99.3 F (37.4 C), temperature source Axillary, resp. rate 20, height 6' (1.829 m), weight  95.8 kg, SpO2 99 %.    FiO2 (%):  [21 %] 21 %   Intake/Output Summary (Last 24 hours) at 01/21/2021 1300 Last data filed at 01/21/2021 1000 Gross per 24 hour  Intake 4223.68 ml  Output 2750 ml  Net 1473.68 ml   Filed Weights   01/19/21 0500 01/20/21 0341 01/21/21 0500  Weight: 91 kg 91.8 kg 95.8 kg    Examination:  General: On trach collar HENT: NCAT tracheostomy in place PULM: Rhonchi bilaterally CV: S1-S2 appreciated GI: BS+, soft, nontender MSK: normal bulk and tone Neuro: No spontaneous movements   Resolved Hospital Problem list     Assessment & Plan:   Acute left basal ganglia intraparenchymal hemorrhage with intraventricular hemorrhage in the setting of hypertensive emergency Encephalopathy 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 Pearlean Brownie at bedside Discussed with spouse at bedside  Discussion with palliative care will be ongoing this afternoon I did discuss resuscitation status-patient's spouse and his brother whom we reached by phone we will continue to talk about it  Continue trach care  Will continue to follow for tracheostomy   Best Practice (right click and "Reselect all SmartList Selections" daily)   Per HPI  Labs   CBC: Recent Labs  Lab 01/17/21 0342 01/18/21 0456 01/19/21 0343 01/20/21 0246 01/21/21 0232  WBC 10.9* 8.6 10.0 10.0 9.7  NEUTROABS 8.9* 6.7 7.4 7.5 7.2  HGB 10.1* 9.2* 8.9* 9.8* 9.4*  HCT 33.0* 29.8* 28.9* 31.2*  29.8*  MCV 77.1* 77.4* 77.3* 75.7* 75.3*  PLT 355 404* 381 360 [id number]    Basic Metabolic Panel: Recent Labs  Lab 01/16/21 0402 01/16/21 1215 01/17/21 0342 01/18/21 0456 01/18/21 1230 01/18/21 1807 01/19/21 0343 01/20/21 0246  NA 155* 159* 156* 155* 154* 152* 148* 146*   K 3.9 3.7 5.0 4.0  --   --  3.9 4.1  CL 118*  --  121* 121*  --   --  117* 114*  CO2 25  --  23 24  --   --  21* 24  GLUCOSE 122*  --  129* 160*  --   --  159* 123*  BUN 62*  --  61* 56*  --   --  54* 47*  CREATININE 2.03*  --  2.02* 2.04*  --   --  2.08* 1.89*  CALCIUM 8.8*  --  8.8* 8.8*  --   --  8.2* 8.5*   GFR: Estimated Creatinine Clearance: 51.8 mL/min (A) (by C-G formula based on SCr of 1.89 mg/dL (H)). Recent Labs  Lab 01/18/21 0456 01/19/21 0343 01/20/21 0246 01/21/21 0232  WBC 8.6 10.0 10.0 9.7    Liver Function Tests: Recent Labs  Lab 01/15/21 0706 01/16/21 0402  AST 68* 58*  ALT 97* 76*  ALKPHOS 96 89  BILITOT 0.6 0.6  PROT 6.2* 6.4*  ALBUMIN 2.0* 2.0*   No results for input(s): LIPASE, AMYLASE in the last 168 hours. No results for input(s): AMMONIA in the last 168 hours.  ABG    Component Value Date/Time   PHART 7.501 (H) 01/16/2021 1215   PCO2ART 36.2 01/16/2021 1215   PO2ART 110 (H) 01/16/2021 1215   HCO3 28.2 (H) 01/16/2021 1215   TCO2 29 01/16/2021 1215   ACIDBASEDEF 4.0 (H) 01/03/2021 0857   O2SAT 99.0 01/16/2021 1215     Coagulation Profile: No results for input(s): INR, PROTIME in the last 168 hours.  Cardiac Enzymes: No results for input(s): CKTOTAL, CKMB, CKMBINDEX, TROPONINI in the last 168 hours.  HbA1C: Hemoglobin A1C  Date/Time Value Ref Range Status  08/18/2017 03:42 PM 5.7 (A) 4.0 - 5.6 % Final    Comment:    CBG 134  06/10/2016 02:58 PM 6.4  Final   Hgb A1c MFr Bld  Date/Time Value Ref Range Status  12/31/2020 02:40 AM 6.3 (H) 4.8 - 5.6 % Final    Comment:    (NOTE)         Prediabetes: 5.7 - 6.4         Diabetes: >6.4         Glycemic control for adults with diabetes: <7.0   04/19/2015 06:22 PM 6.0 (H) 4.8 - 5.6 % Final    Comment:    (NOTE)         Pre-diabetes: 5.7 - 6.4         Diabetes: >6.4         Glycemic control for adults with diabetes: <7.0     CBG: Recent Labs  Lab 01/20/21 2322 01/21/21 0325  01/21/21 0514 01/21/21 0833 01/21/21 1141  GLUCAP 142* 121* 141* 110* 133*

## 2021-01-21 NOTE — Progress Notes (Addendum)
Daily Progress Note   Patient Name: Stephen Delgado       Date: 01/21/2021 DOB: 01-21-1966  Age: 55 y.o. MRN#: [id number] Attending Physician: Terrilee Croak, MD Primary Care Physician: Patient, [name] Admit Date: 12/30/2020  Reason for Consultation/Follow-up: Establishing goals of care  Patient Profile/HPI: Stephen Delgado is a 55 year old male with history of hypertension chronic kidney disease, diabetes, hyperlipidemia, admitted on 12/30/2020 with altered mental status.  Work-up revealed intracranial hemorrhage, intraparenchymal hemorrhage with intraventricular extension, repeat MRI on 12/16 showed increase in size and number of acute infarcts in the bilateral cerebral hemispheres and increased involvement with the cerebellum.  His recovery has been complicated by aspiration pneumonia requiring long-term intubation and eventual tracheostomy-he is no longer ventilator dependent.  He has had no recovery in his neurologic function and is in primarily a vegetative state.  He has a PEG tube in place and is receiving artificial nutrition.   Subjective: Met with patient's 2 sons Stephen Delgado and Stephen Delgado, his wife Stephen Delgado, and his brother Stephen Delgado. Extensive discussion was had regarding his devastating strokes. We discussed what the goals of medical care would be for the patient, family shared their goal would be for him to return to a state where he can interact with family and make his needs known.  We discussed that it is unlikely that medical interventions will be able to meet that goal. Family shares that they respect and believe in modern medicine, however they continue to hope for a miracle.  They do understand his status and his poor prognosis and the fact that they are hoping for a  miracle does indicate this knowledge.  We discussed what his future trajectory likely consists of- bed sores, infections, recurrent pneumonia and further suffering until his eventual death.  Discussed anticipated decision making including if patient's condition were to acutely worsen or if he were to experience cardiac or respiratory arrest.  They continue to endorse full scope care and full CODE STATUS. When asked what they would consider to be the bottom line, what conditions would indicate to them that patient was not recovering (aside from medical providers input)- they stated major organ failure. I encouraged them to consider that he does have major organ failure- his brain- which affects all his other organs and organ systems.  Encouraged patient/family to  consider DNR/DNI status understanding evidenced based poor outcomes in similar hospitalized patients, as the cause of the arrest is likely associated with chronic/terminal disease rather than a reversible acute cardio-pulmonary event.   We discussed future care locations- he has had maximum benefit from an acute care hospital. He will need long term care in a facility- they are in agreement.   Review of Systems  Unable to perform ROS: Patient unresponsive    Physical Exam Vitals and nursing note reviewed.  Constitutional:      Appearance: He is ill-appearing.  Cardiovascular:     Rate and Rhythm: Normal rate.  Pulmonary:     Effort: Pulmonary effort is normal.            Vital Signs: BP 128/87 (BP Location: Right Arm)    Pulse 79    Temp 99 F (37.2 C)    Resp (!) 30    Ht 6' (1.829 m)    Wt 95.8 kg    SpO2 97%    BMI 28.64 kg/m  SpO2: SpO2: 97 % O2 Device: O2 Device: Tracheostomy Collar (changed ATC setup) O2 Flow Rate: O2 Flow Rate (L/min): 5 L/min  Intake/output summary:  Intake/Output Summary (Last 24 hours) at 01/21/2021 1724 Last data filed at 01/21/2021 1300 Gross per 24 hour  Intake 3552.78 ml  Output 2350 ml  Net  1202.78 ml   LBM: Last BM Date: 01/21/21 Baseline Weight: Weight: 85.2 kg (bedscale) Most recent weight: Weight: 95.8 kg       Palliative Assessment/Data: PPS: 10%        Palliative Care Assessment & Plan    Assessment/Recommendations/Plan  Family continues to endorse full scope treatment and will accept all offered medical interventions They are aware of his poor prognosis Family has Palliative team contact information and will contact us if they have more questions I completed FMLA papers for patient's spouse and faxed them to number on cover sheet   Code Status: DNR  Prognosis:  Unable to determine  Discharge Planning: To Be Determined  Care plan was discussed with patient's family.   Thank you for allowing the Palliative Medicine Team to assist in the care of this patient.  Total time:  90 minutes      Greater than 50%  of this time was spent counseling and coordinating care related to the above assessment and plan.  Mariana Kaufman, AGNP-C Palliative Medicine   Please contact Palliative Medicine Team phone at [phone] for questions and concerns.

## 2021-01-21 NOTE — Progress Notes (Signed)
PROGRESS NOTE  Stephen Delgado  DOB: [birth year]  PCP: Patient, [name] JJO:[id number]  DOA: 12/30/2020  LOS: 22 days  Hospital Day: 23  Chief Complaint  Patient presents with   Altered Mental Status    Brief narrative: Stephen Delgado is a 55 y.o. male with PMH significant for DM2, HTN, CKD with questionable compliance to medications Patient presented to the ED on 12/30/2020 with complaint of headache, altered mental status  In the ED, he was hypertensive to 238/160, agitated, required restraints CT head showed large amount of intraventricular hemorrhage involving lateral/third/fourth ventricle, ICH and left basal ganglia as well as chronic microvascular ischemic changes. He was started on Cleviprex drip and admitted to neuro ICU  He subsequently had CT head repeated on 11/30, 12/1 and 12/3, all demonstrated unchanged intraparenchymal hemorrhage and intraventricular hemorrhage 12/3, MRI brain from also showed numerous small acute infarcts in both cerebral hemisphere with minimal improvement in the right superior cerebellum 12/3, intubated 12/13, tracheostomy 12/15, wean from vent to trach collar 12/16, PEG tube placement 12/16, repeat MRI showed an increase in the size and number of acute infarcts in the bilateral hemispheres with increased involvement in the cerebellum bilaterally.   12/18, transferred from neuro service to [facility]   His hospital course has been complicated by significant neurological impairment, aspiration events, AKI, hypernatremia, urinary retention. Palliative care was consulted at 12/11.  Per note, family wanted to continue aggressive care as full CODE STATUS.  Subjective: Patient was seen and examined this morning.  No change in status in last 48 hours.  Remains comatose.  Tracheostomy tube and PEG tube in place. Noted the plan from palliative for family meeting today.  Assessment/Plan: Acute intraparenchymal and intraventricular  hemorrhage Bilateral multifocal ischemic infarcts -Ischemic and hemorrhagic stroke likely from combination of uncontrolled hypertension and small vessel disease.  -no improvement in multiple repeat imagings.  The MRI from 12/16 actually showed an increase in size and number of acute infarcts. -No evidence of intracranial vascular stenosis or occlusion. -Neurology following.  Prior to admission, patient was on aspirin 81 mg daily.  Currently on no blood thinners because of intracranial hemorrhage.  Acute respiratory failure with hypoxia MSSA and serratia penumonia  -s/p tracheostomy -Trach collar in place -Currently on IV Zosyn since 12/15. -Routine trach care.   -PCCM following  Essential hypertension -Blood pressure on admission was elevated to over 200 systolic. -Currently controlled on Coreg and clonidine.  Continue to monitor blood pressure.  AKI on CKD 3b -For the last few days, creatinine has been stable close to 2.  Continue to monitor.  Recent Labs    01/12/21 1301 01/13/21 0712 01/14/21 0358 01/15/21 0706 01/15/21 1116 01/16/21 0402 01/17/21 0342 01/18/21 0456 01/19/21 0343 01/20/21 0246  BUN 74* 79* 82* 69* 67* 62* 61* 56* 54* 47*  CREATININE 2.45* 2.66* 2.65* 2.11* 2.15* 2.03* 2.02* 2.04* 2.08* 1.89*    Hypernatremia -Sodium level was significantly elevated up to 159 at the peak.  Partly responsible for altered mentation. -Gradually improving sodium level, 146 on last check on 12/20., remains on dextrose drip. Recent Labs  Lab 01/15/21 1116 01/15/21 2258 01/16/21 0402 01/16/21 1215 01/17/21 0342 01/18/21 0456 01/18/21 1230 01/18/21 1807 01/19/21 0343 01/20/21 0246  NA 156* 154* 155* 159* 156* 155* 154* 152* 148* 146*    Type 2 diabetes mellitus -A1c 6.3 in 12/31/2020 -Currently on Lantus 20 units daily, aspart 5 units every 4 hours with sliding scale insulin. -Blood sugar level controlled. Recent  Labs  Lab 01/20/21 2322 01/21/21 0325  01/21/21 0514 01/21/21 0833 01/21/21 1141  GLUCAP 142* 121* 141* 110* 133*    Protein calorie malnutrition -Continue dietary supplements   Acute urinary retention  -Continue Urecholine -Currently has Foley catheter.   Goals of care -Poor prognosis.  No neurological recovery in last several days.  In fact repeat MRI on 12/16 shows worsening stroke compared to scan on admission.  -Last seen by palliative on 12/11. -I had a long discussion with patient's son on 12/19.  Palliative care follow-up appreciated.  Noted a plan for family meeting today.  Mobility: Bedbound, Living condition: Was at home prior to this admission Goals of care:   Code Status: Full Code  Nutritional status: Body mass index is 28.64 kg/m.  Nutrition Problem: Inadequate oral intake Etiology: lethargy/confusion, dysphagia Signs/Symptoms: NPO status Diet:  Diet Order             Diet NPO time specified  Diet effective midnight                  DVT prophylaxis:  heparin injection 5,000 Units Start: 01/05/21 1400 SCD's Start: 12/30/20 1908   Antimicrobials: Zosyn IV till 12/21 Fluid: Dextrose 75 mL/h Consultants: Neurology, PCCM, palliative Family Communication: Family meeting planned for today.  Status is: Inpatient  Continue in-hospital care because: Not waking up Level of care: Progressive   Dispo: The patient is from: Home              Anticipated d/c is to: Unclear at this time.  Hospice has been recommended.              Patient currently is not medically stable to d/c.   Difficult to place patient No     Infusions:   dextrose 75 mL/hr at 01/21/21 1300   feeding supplement (GLUCERNA 1.2 CAL) 1,000 mL (01/21/21 0714)   piperacillin-tazobactam (ZOSYN)  IV Stopped (01/21/21 1017)    Scheduled Meds:  bethanechol  25 mg Per Tube TID   busPIRone  10 mg Per Tube TID   carvedilol  25 mg Per Tube BID WC   chlorhexidine  15 mL Mouth Rinse BID   Chlorhexidine Gluconate Cloth  6 each  Topical Q0600   cloNIDine  0.1 mg Per Tube Q8H   feeding supplement (PROSource TF)  45 mL Per Tube Daily   free water  200 mL Per Tube Q4H   glycopyrrolate  1 mg Per Tube TID   heparin injection (subcutaneous)  5,000 Units Subcutaneous Q8H   insulin aspart  0-20 Units Subcutaneous Q4H   insulin aspart  5 Units Subcutaneous Q4H   insulin detemir  20 Units Subcutaneous BID   mouth rinse  15 mL Mouth Rinse q12n4p   pantoprazole sodium  40 mg Per Tube Daily    PRN meds: acetaminophen **OR** acetaminophen (TYLENOL) oral liquid 160 mg/5 mL **OR** acetaminophen, albuterol, docusate, fentaNYL (SUBLIMAZE) injection, hydrALAZINE, influenza vac split quadrivalent PF, labetalol, ondansetron (ZOFRAN) IV, polyethylene glycol   Antimicrobials: Anti-infectives (From admission, onward)    Start     Dose/Rate Route Frequency Ordered Stop   01/15/21 1630  piperacillin-tazobactam (ZOSYN) IVPB 3.375 g        3.375 g 12.5 mL/hr over 240 Minutes Intravenous Every 8 hours 01/15/21 0943 01/21/21 2359   01/15/21 1030  piperacillin-tazobactam (ZOSYN) IVPB 3.375 g        3.375 g 100 mL/hr over 30 Minutes Intravenous  Once 01/15/21 0943 01/15/21 1035   01/05/21  1600  ceFAZolin (ANCEF) IVPB 2g/100 mL premix        2 g 200 mL/hr over 30 Minutes Intravenous Every 8 hours 01/05/21 0853 01/10/21 0848   01/03/21 0830  piperacillin-tazobactam (ZOSYN) IVPB 3.375 g  Status:  Discontinued        3.375 g 12.5 mL/hr over 240 Minutes Intravenous Every 8 hours 01/03/21 0800 01/05/21 0853       Objective: Vitals:   01/21/21 1110 01/21/21 1143  BP: 136/73 137/73  Pulse: 89 80  Resp: (!) 33 20  Temp:  99.3 F (37.4 C)  SpO2: 95% 99%    Intake/Output Summary (Last 24 hours) at 01/21/2021 1349 Last data filed at 01/21/2021 1300 Gross per 24 hour  Intake 4924.74 ml  Output 2750 ml  Net 2174.74 ml    Filed Weights   01/19/21 0500 01/20/21 0341 01/21/21 0500  Weight: 91 kg 91.8 kg 95.8 kg   Weight change: 4  kg Body mass index is 28.64 kg/m.   Physical Exam: General exam: Middle-aged African-American male.  Remains comatose.  Has a trach and PEG tube Skin: No rashes, lesions or ulcers. HEENT: Atraumatic, normocephalic, no obvious bleeding Lungs: Diminished air entry on both bases CVS: Regular rate and rhythm, no murmur GI/Abd soft, nontender, nondistended, bowel sound present CNS: Comatose, unable to open eyes on command.  Spontaneous movements seen Psychiatry: Unable to examine because of altered mental status Extremities: No pedal edema, no tenderness  Data Review: I have personally reviewed the laboratory data and studies available.  F/u labs ordered Unresulted Labs (From admission, onward)     Start     Ordered   01/07/21 0500  CBC with Differential/Platelet  Daily,   R     Question:  Specimen collection method  Answer:  Lab=Lab collect   01/06/21 0806   12/30/20 1658  CBC with Differential  Once,   R        12/30/20 1657            Signed, Terrilee Croak, MD [facility] 01/21/2021

## 2021-01-22 DIAGNOSIS — I610 Nontraumatic intracerebral hemorrhage in hemisphere, subcortical: Secondary | ICD-10-CM | POA: Diagnosis not present

## 2021-01-22 LAB — CBC WITH DIFFERENTIAL/PLATELET
Abs Immature Granulocytes: 0.22 10*3/uL — ABNORMAL HIGH (ref 0.00–0.07)
Basophils Absolute: 0 10*3/uL (ref 0.0–0.1)
Basophils Relative: 0 %
Eosinophils Absolute: 0.5 10*3/uL (ref 0.0–0.5)
Eosinophils Relative: 5 %
HCT: 32.9 % — ABNORMAL LOW (ref 39.0–52.0)
Hemoglobin: 10.4 g/dL — ABNORMAL LOW (ref 13.0–17.0)
Immature Granulocytes: 2 %
Lymphocytes Relative: 14 %
Lymphs Abs: 1.4 10*3/uL (ref 0.7–4.0)
MCH: 23.8 pg — ABNORMAL LOW (ref 26.0–34.0)
MCHC: 31.6 g/dL (ref 30.0–36.0)
MCV: 75.3 fL — ABNORMAL LOW (ref 80.0–100.0)
Monocytes Absolute: 0.6 10*3/uL (ref 0.1–1.0)
Monocytes Relative: 6 %
Neutro Abs: 7.4 10*3/uL (ref 1.7–7.7)
Neutrophils Relative %: 73 %
Platelets: 339 10*3/uL (ref 150–400)
RBC: 4.37 MIL/uL (ref 4.22–5.81)
RDW: 14.4 % (ref 11.5–15.5)
WBC: 10.1 10*3/uL (ref 4.0–10.5)
nRBC: 0 % (ref 0.0–0.2)

## 2021-01-22 LAB — GLUCOSE, CAPILLARY
Glucose-Capillary: 118 mg/dL — ABNORMAL HIGH (ref 70–99)
Glucose-Capillary: 119 mg/dL — ABNORMAL HIGH (ref 70–99)
Glucose-Capillary: 125 mg/dL — ABNORMAL HIGH (ref 70–99)
Glucose-Capillary: 130 mg/dL — ABNORMAL HIGH (ref 70–99)
Glucose-Capillary: 132 mg/dL — ABNORMAL HIGH (ref 70–99)
Glucose-Capillary: 133 mg/dL — ABNORMAL HIGH (ref 70–99)
Glucose-Capillary: 140 mg/dL — ABNORMAL HIGH (ref 70–99)

## 2021-01-22 MED ORDER — MORPHINE SULFATE (PF) 2 MG/ML IV SOLN
1.0000 mg | INTRAVENOUS | Status: DC | PRN
  Administered 2021-01-23 – 2021-01-24 (×2): 1 mg via INTRAVENOUS
  Filled 2021-01-22 (×2): qty 1

## 2021-01-22 MED ORDER — FREE WATER
200.0000 mL | Status: DC
Start: 2021-01-23 — End: 2021-01-23
  Administered 2021-01-23 (×3): 200 mL

## 2021-01-22 NOTE — Progress Notes (Signed)
Palliative-   Called patient's son for followup from family meeting yesterday. Left message with return number and also informed him that I completed and faxed his mother's FMLA papers yesterday evening.   Ocie Bob, AGNP-C Palliative Medicine  No charge

## 2021-01-22 NOTE — Progress Notes (Signed)
STROKE TEAM PROGRESS NOTE   INTERVAL HISTORY Patient neurological exam remains unchanged.Marland Kitchen  He is tolerating trach collar well..  He has been hemodynamically stable.     His wife wants to continue aggressive care and full CODE STATUS despite meeting with me and critical care yesterday as well as palliative care team.  Neurological exam unchanged.  Vital signs stable.   Vitals:   01/22/21 0835 01/22/21 1142 01/22/21 1210 01/22/21 1219  BP:  (!) 169/95  (!) 146/78  Pulse:  91 88 90  Resp: (!) 24 (!) 32 (!) 26 (!) 22  Temp:  99.4 F (37.4 C)  99.2 F (37.3 C)  TempSrc:  Axillary    SpO2:  97% 98% 98%  Weight:      Height:       CBC:  Recent Labs  Lab 01/21/21 0232 01/22/21 0206  WBC 9.7 10.1  NEUTROABS 7.2 7.4  HGB 9.4* 10.4*  HCT 29.8* 32.9*  MCV 75.3* 75.3*  PLT 331 99991111   Basic Metabolic Panel:  Recent Labs  Lab 01/19/21 0343 01/20/21 0246  NA 148* 146*  K 3.9 4.1  CL 117* 114*  CO2 21* 24  GLUCOSE 159* 123*  BUN 54* 47*  CREATININE 2.08* 1.89*  CALCIUM 8.2* 8.5*   Lipid Panel:  No results for input(s): CHOL, TRIG, HDL, CHOLHDL, VLDL, LDLCALC in the last 168 hours.   IMAGING past 24 hours No results found.  PHYSICAL EXAM General:  Patient is a well-developed, well-nourished middle-aged African-American male with a tracheostomy.  Respiratory: Patient with tracheostomy, on ventilator   Neurological:Patient is lying in bed unresponsive with eyes open eyes are slightly deviated tongue with left eye deviated down and out.  Does not respond to voice or sternal rub. PERRL, doll's eyes reflex present, corneal reflexes present bilaterally but weak. Cough and gag reflexes present.  Will flicker LUE to noxious stimuli but no response in other extremities.   ASSESSMENT/PLAN Stephen Delgado is a 55 y.o. male with history of HTN (questionable compliance with medications), DM and CKD 3 presenting with headache and altered mental status. He was taken to the ED and found  to have a left basal ganglia ICH with IVH.  He was hypertensive on arrival to 238/160 and was given Cleviprex for BP control.  He was also agitated and required restraints.  Repeat head CT 11/30 shows unchanged IPH with IVH  Head CTs done on 12/1 and 12/3 also demonstrates unchanged IPH with IVH.  MRI/MRA completed on 12/4.patient's neurological exam continues to remain the same.  Goals of care conversation happened 12/11. Tracheostomy performed 12/13.  PEG tube placed 12/16.  Repeat MRI 12/16.  ICH score 2  ICH with IVH - left basal ganglia ICH with IVH likely secondary to hypertensive emergency  bilateral lacunar infarcts - likely small vessel disease vs. Strict BP control in the setting of ICH CT head Large basal ganglia IPH with IVH involving the lateral, third and fourth ventricles with no significant mass effect or midline shift.  Repeat CT 11/30 0116 Unchanged IPH with IVH Repeat CT head 11/30 1629  Stable IPH with IVH, stable mild hydrocephalus Repeat CT Head 12/6 - Unchanged IVH and left caudate head intraparenchymal component. No new site of hemorrhage. 2D Echo EF 99991111, grade 1 diastolic dysfunction, no atrial level shunt MRI-12/3-Numerous small acute infarcts in bilateral cerebral hemispheres. Minimal involvement of the right superior cerebellum.  Left caudate hemorrhage, intraventricular hemorrhage, and scattered sulcal subarachnoid hemorrhage as seen on  recent CT imaging. Associated mild communicating hydrocephalus. Numerous chronic microhemorrhages probably secondary to hypertension. Chronic microvascular ischemic changes MRI-12/16-increase in the size and number of acute infarcts in the bilateral hemispheres with increased involvement in the cerebellum bilaterally. MRA head and neck 10/4- Mildly tortuous cervical carotid and vertebral arteries without stenosis. Intracranial hemorrhage and ventricles appears stable from the MRI yesterday. CUS unremarkable LDL 111.4 HgbA1c 6.3 VTE  prophylaxis - SCDs aspirin 81 mg daily prior to admission, now on No antithrombotic secondary to IPH Therapy recommendations:  SNF Disposition:  pending  Respiratory distress S/p tracheostomy CCM on board Tachypnea- PRN fentanyl and Versed ETT exchanged 12/4 due to a cuff leak Precedex for sedation currently running. Prop is on hold  Hypertensive emergency- improving Home meds:  Amlodipine 10 mg daily, metoprolol succinate 25 mg daily,  Unstable No longer requiring Cardene to maintain BP at goal.  SBP goal <160 Currently On coreg 25mg , Clonidine 0.3->0.2->0.1 mg q8h and minoxidil 10mg  daily Long-term BP goal normotensive  Hyperlipidemia Home meds:  none LDL 111.4, goal < 70 Hold off statin for now due to acute ICH Consider statin at discharge         AKI on CKD Cre 2.98->2.45-> 2.65-> 2.11-> 2.15 On TF Close monitoring I/O CCM on board  Hyperglycemia CBG monitoring SSI On Levemir 20 units twice daily On NovoLog 5 units q4      Dysphagia Did not pass swallow Coretrak in place with tube feed PEG tube placement 12/16  Other Stroke Risk Factors Questionable compliance with medications at home        Other Active Problems Code status - full code Hypokalemia 3.1 -> 3.4 -> 3.5-> 4.5->3.6-> 3.1-> 3.6-> 3.9->3.7 Resolved Hypernatremia- 153->152->149->148-> 144->142->143-> 145-> 156->154  - free water 200 ml Q 2 hours  - D5W at 25 mL/hr Ileus seen on KUB- fleet enema given 12/1; bowel regimen in place Aspiration pneumonia  Zosyn started 12/15 per CCM  Now afebrile WBCs  15.1->12.6 Anemia - Hb 10.1  Hospital day # 23  Patient neurological exam remains 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  His prognosis for making meaningful improvement is not good but wife wants to continue aggressive care at present time..  . Stroke team will sign off    Stroke team will be available to answer any questions that the family may have if needed.  Discussed with Dr. 14/1.  Long  discussion with patient's wife at the bedside and answered questions about his condition.  Greater than 50% time during this 25-minute visit was spent in counseling and coordination of care and discussion with care team and answering questions.    1/16, MD Medical Director [facility] Pager: [phone] 01/22/2021 1:43 PM    To contact Stroke Continuity provider, please refer to [phone]. After hours, contact General Neurology

## 2021-01-22 NOTE — Progress Notes (Signed)
°PROGRESS NOTE ° °Stephen Delgado  °DOB: 09/21/1965  °PCP: Patient, [name] °MRN:[id number]  °DOA: 12/30/2020 ° LOS: 23 days  °Hospital Day: 24 ° °Chief Complaint  °Patient presents with  ° Altered Mental Status  °  °Brief narrative: °Stephen Delgado is a 55 y.o. male with PMH significant for DM2, HTN, CKD with questionable compliance to medications °Patient presented to the ED on 12/30/2020 with complaint of headache, altered mental status ° °In the ED, he was hypertensive to 238/160, agitated, required restraints °CT head showed large amount of intraventricular hemorrhage involving lateral/third/fourth ventricle, ICH and left basal ganglia as well as chronic microvascular ischemic changes. °He was started on Cleviprex drip and admitted to neuro ICU ° °He subsequently had CT head repeated on 11/30, 12/1 and 12/3, all demonstrated unchanged intraparenchymal hemorrhage and intraventricular hemorrhage °12/3, MRI brain from also showed numerous small acute infarcts in both cerebral hemisphere with minimal improvement in the right superior cerebellum °12/3, intubated °12/13, tracheostomy °12/15, wean from vent to trach collar °12/16, PEG tube placement °12/16, repeat MRI showed an increase in the size and number of acute infarcts in the bilateral hemispheres with increased involvement in the cerebellum bilaterally.   °12/18, transferred from neuro service to [facility]  °12/21, palliative care consulted.  Family wanted to continue aggressive care as full CODE STATUS. ° °His hospital course has been complicated by significant neurological impairment, aspiration events, AKI, hypernatremia, urinary retention. ° °Subjective: °Patient was seen and examined this morning.  °No change in status since I last started seeing him on 12/19.  Remains comatose.   °Tracheostomy tube and PEG tube in place. ° °Assessment/Plan: °Acute intraparenchymal and intraventricular hemorrhage °Bilateral multifocal ischemic infarcts °-Ischemic  and hemorrhagic stroke likely from combination of uncontrolled hypertension and small vessel disease.  °-no improvement in multiple repeat imagings.  The MRI from 12/16 actually showed an increase in size and number of acute infarcts. °-No evidence of intracranial vascular stenosis or occlusion. °-Neurology following.  Prior to admission, patient was on aspirin 81 mg daily.  Currently on no blood thinners because of intracranial hemorrhage. ° °Acute respiratory failure with hypoxia °MSSA and serratia penumonia  °-s/p tracheostomy °-Trach collar in place °-Currently on IV Zosyn since 12/15. °-Routine trach care.   °-PCCM following ° °Essential hypertension °-Blood pressure on admission was elevated to over 200 systolic. °-Currently controlled on Coreg and clonidine.  Continue to monitor blood pressure. ° °AKI on CKD 3b °-For the last few days, creatinine has been stable close to 2.  Continue to monitor.  °Recent Labs  °  01/12/21 °1301 01/13/21 °0712 01/14/21 °0358 01/15/21 °0706 01/15/21 °1116 01/16/21 °0402 01/17/21 °0342 01/18/21 °0456 01/19/21 °0343 01/20/21 °0246  °BUN 74* 79* 82* 69* 67* 62* 61* 56* 54* 47*  °CREATININE 2.45* 2.66* 2.65* 2.11* 2.15* 2.03* 2.02* 2.04* 2.08* 1.89*  ° °Hypernatremia °-Sodium level was significantly elevated up to 159 at the peak.  Partly responsible for altered mentation. °-Gradually improving sodium level, 146 on last check on 12/20., remains on dextrose drip. °Recent Labs  °Lab 01/15/21 °2258 01/16/21 °0402 01/16/21 °1215 01/17/21 °0342 01/18/21 °0456 01/18/21 °1230 01/18/21 °1807 01/19/21 °0343 01/20/21 °0246  °NA 154* 155* 159* 156* 155* 154* 152* 148* 146*  ° °Type 2 diabetes mellitus °-A1c 6.3 in 12/31/2020 °-Currently on Lantus 20 units twice daily.  I will stop dextrose drip and scheduled Premeal aspart today.  -Continue sliding scale insulin every 4 hours. °Recent Labs  °Lab 01/21/21 °1955 01/21/21 °2339   01/22/21 °0328 01/22/21 °0823 01/22/21 °1144  °GLUCAP 102* 118* 119*  133* 130*  ° °Protein calorie malnutrition °-Continue dietary supplements °  °Acute urinary retention  °-Continue Urecholine °-Currently has Foley catheter. °  °Goals of care °-Poor prognosis.  No neurological recovery in last several days.  In fact repeat MRI on 12/16 shows worsening stroke compared to scan on admission.  °-Last seen by palliative on 12/11. °-I had a long discussion with patient's son on 12/19.  Palliative care met with patient's son and wife on 12/21.  They want to continue aggressive care with full CODE STATUS at this time.   ° °Mobility: Bedbound, °Living condition: Was at home prior to this admission °Goals of care:   Code Status: Full Code  °Nutritional status: °Body mass index is 27.81 kg/m².  °Nutrition Problem: Inadequate oral intake °Etiology: lethargy/confusion, dysphagia °Signs/Symptoms: NPO status °Diet:  °Diet Order   ° °       °  Diet NPO time specified  Diet effective midnight       °  ° °  °  ° °  ° °DVT prophylaxis:  °heparin injection 5,000 Units Start: 01/05/21 1400 °SCD's Start: 12/30/20 1908 °  °Antimicrobials: Zosyn IV till 12/21 °Fluid: Stop dextrose. °Consultants: Neurology, PCCM, palliative °Family Communication: Family meeting with palliative yesterday.  No family at bedside today ° °Status is: Inpatient ° °Continue in-hospital care because: Not waking up °Level of care: Progressive  ° °Dispo: The patient is from: Home °             Anticipated d/c is to: Needs SNF °             Patient currently is medically stable to d/c. °  Difficult to place patient No ° ° ° ° °Infusions:  ° feeding supplement (GLUCERNA 1.2 CAL) 1,000 mL (01/22/21 0041)  ° ° °Scheduled Meds: ° bethanechol  25 mg Per Tube TID  ° carvedilol  25 mg Per Tube BID WC  ° chlorhexidine  15 mL Mouth Rinse BID  ° Chlorhexidine Gluconate Cloth  6 each Topical Q0600  ° cloNIDine  0.1 mg Per Tube Q8H  ° feeding supplement (PROSource TF)  45 mL Per Tube Daily  ° free water  200 mL Per Tube Q4H  ° glycopyrrolate  1  mg Per Tube TID  ° heparin injection (subcutaneous)  5,000 Units Subcutaneous Q8H  ° insulin aspart  0-20 Units Subcutaneous Q4H  ° insulin aspart  5 Units Subcutaneous Q4H  ° insulin detemir  20 Units Subcutaneous BID  ° mouth rinse  15 mL Mouth Rinse q12n4p  ° pantoprazole sodium  40 mg Per Tube Daily  ° ° °PRN meds: °acetaminophen **OR** acetaminophen (TYLENOL) oral liquid 160 mg/5 mL **OR** acetaminophen, albuterol, docusate, hydrALAZINE, influenza vac split quadrivalent PF, labetalol, morphine injection, ondansetron (ZOFRAN) IV, polyethylene glycol  ° °Antimicrobials: °Anti-infectives (From admission, onward)  ° ° Start     Dose/Rate Route Frequency Ordered Stop  ° 01/15/21 1630  piperacillin-tazobactam (ZOSYN) IVPB 3.375 g       ° 3.375 g °12.5 mL/hr over 240 Minutes Intravenous Every 8 hours 01/15/21 0943 01/22/21 0400  ° 01/15/21 1030  piperacillin-tazobactam (ZOSYN) IVPB 3.375 g       ° 3.375 g °100 mL/hr over 30 Minutes Intravenous  Once 01/15/21 0943 01/15/21 1035  ° 01/05/21 1600  ceFAZolin (ANCEF) IVPB 2g/100 mL premix       ° 2 g °200 mL/hr over 30 Minutes Intravenous Every   Every 8 hours 01/05/21 0853 01/10/21 0848   01/03/21 0830  piperacillin-tazobactam (ZOSYN) IVPB 3.375 g  Status:  Discontinued        3.375 g 12.5 mL/hr over 240 Minutes Intravenous Every 8 hours 01/03/21 0800 01/05/21 0853       Objective: Vitals:   01/22/21 1210 01/22/21 1219  BP:  (!) 146/78  Pulse: 88 90  Resp: (!) 26 (!) 22  Temp:  99.2 F (37.3 C)  SpO2: 98% 98%    Intake/Output Summary (Last 24 hours) at 01/22/2021 1253 Last data filed at 01/22/2021 1031 Gross per 24 hour  Intake 1764.13 ml  Output 2650 ml  Net -885.87 ml   Filed Weights   01/20/21 0341 01/21/21 0500 01/22/21 0436  Weight: 91.8 kg 95.8 kg 93 kg   Weight change: -2.8 kg Body mass index is 27.81 kg/m.   Physical Exam: General exam: Middle-aged African-American male.  Remains comatose.  Has a trach and PEG tube Skin: No rashes,  lesions or ulcers. HEENT: Atraumatic, normocephalic, no obvious bleeding Lungs: Diminished air entry on both bases CVS: Regular rate and rhythm, no murmur GI/Abd soft, nontender, nondistended, bowel sound present CNS: Comatose, unable to open eyes on command.  Spontaneous movements seen Psychiatry: Unable to examine because of altered mental status Extremities: No pedal edema, no tenderness  Data Review: I have personally reviewed the laboratory data and studies available.  F/u labs ordered Unresulted Labs (From admission, onward)     Start     Ordered   01/07/21 0500  CBC with Differential/Platelet  Daily,   R,   Status:  Canceled     Question:  Specimen collection method  Answer:  Lab=Lab collect   01/06/21 0806   12/30/20 1658  CBC with Differential  Once,   R        12/30/20 1657   Unscheduled  CBC with Differential/Platelet  Tomorrow morning,   R       Question:  Specimen collection method  Answer:  Lab=Lab collect   01/22/21 1253   Unscheduled  Basic metabolic panel  Tomorrow morning,   R       Question:  Specimen collection method  Answer:  Lab=Lab collect   01/22/21 1253            Signed, Terrilee Croak, MD [facility] 01/22/2021

## 2021-01-23 ENCOUNTER — Inpatient Hospital Stay (HOSPITAL_COMMUNITY): Payer: 59

## 2021-01-23 DIAGNOSIS — J15211 Pneumonia due to Methicillin susceptible Staphylococcus aureus: Secondary | ICD-10-CM | POA: Clinically undetermined

## 2021-01-23 DIAGNOSIS — I69391 Dysphagia following cerebral infarction: Secondary | ICD-10-CM | POA: Diagnosis not present

## 2021-01-23 DIAGNOSIS — I615 Nontraumatic intracerebral hemorrhage, intraventricular: Secondary | ICD-10-CM | POA: Diagnosis not present

## 2021-01-23 DIAGNOSIS — N179 Acute kidney failure, unspecified: Secondary | ICD-10-CM

## 2021-01-23 DIAGNOSIS — R338 Other retention of urine: Secondary | ICD-10-CM | POA: Diagnosis not present

## 2021-01-23 DIAGNOSIS — Z66 Do not resuscitate: Secondary | ICD-10-CM | POA: Diagnosis not present

## 2021-01-23 DIAGNOSIS — E870 Hyperosmolality and hypernatremia: Secondary | ICD-10-CM | POA: Diagnosis not present

## 2021-01-23 DIAGNOSIS — I6320 Cerebral infarction due to unspecified occlusion or stenosis of unspecified precerebral arteries: Secondary | ICD-10-CM

## 2021-01-23 DIAGNOSIS — E1169 Type 2 diabetes mellitus with other specified complication: Secondary | ICD-10-CM

## 2021-01-23 DIAGNOSIS — L8990 Pressure ulcer of unspecified site, unspecified stage: Secondary | ICD-10-CM | POA: Insufficient documentation

## 2021-01-23 DIAGNOSIS — Z20822 Contact with and (suspected) exposure to covid-19: Secondary | ICD-10-CM | POA: Diagnosis not present

## 2021-01-23 DIAGNOSIS — I611 Nontraumatic intracerebral hemorrhage in hemisphere, cortical: Secondary | ICD-10-CM | POA: Diagnosis not present

## 2021-01-23 DIAGNOSIS — J9601 Acute respiratory failure with hypoxia: Secondary | ICD-10-CM | POA: Diagnosis not present

## 2021-01-23 DIAGNOSIS — J156 Pneumonia due to other aerobic Gram-negative bacteria: Secondary | ICD-10-CM | POA: Clinically undetermined

## 2021-01-23 DIAGNOSIS — I610 Nontraumatic intracerebral hemorrhage in hemisphere, subcortical: Secondary | ICD-10-CM | POA: Diagnosis not present

## 2021-01-23 DIAGNOSIS — Z515 Encounter for palliative care: Secondary | ICD-10-CM | POA: Diagnosis not present

## 2021-01-23 DIAGNOSIS — R069 Unspecified abnormalities of breathing: Secondary | ICD-10-CM | POA: Insufficient documentation

## 2021-01-23 DIAGNOSIS — E669 Obesity, unspecified: Secondary | ICD-10-CM

## 2021-01-23 DIAGNOSIS — N1832 Chronic kidney disease, stage 3b: Secondary | ICD-10-CM | POA: Insufficient documentation

## 2021-01-23 DIAGNOSIS — Z930 Tracheostomy status: Secondary | ICD-10-CM | POA: Insufficient documentation

## 2021-01-23 DIAGNOSIS — J1569 Pneumonia due to other gram-negative bacteria: Secondary | ICD-10-CM | POA: Clinically undetermined

## 2021-01-23 HISTORY — DX: Cerebral infarction due to unspecified occlusion or stenosis of unspecified precerebral arteries: I63.20

## 2021-01-23 HISTORY — DX: Acute kidney failure, unspecified: N17.9

## 2021-01-23 HISTORY — DX: Dysphagia following cerebral infarction: I69.391

## 2021-01-23 LAB — BLOOD GAS, ARTERIAL
Acid-base deficit: 3.4 mmol/L — ABNORMAL HIGH (ref 0.0–2.0)
Bicarbonate: 19.2 mmol/L — ABNORMAL LOW (ref 20.0–28.0)
Drawn by: 13761
FIO2: 21
O2 Saturation: 98.2 %
Patient temperature: 37
pCO2 arterial: 24.6 mmHg — ABNORMAL LOW (ref 32.0–48.0)
pH, Arterial: 7.505 — ABNORMAL HIGH (ref 7.350–7.450)
pO2, Arterial: 94 mmHg (ref 83.0–108.0)

## 2021-01-23 LAB — GLUCOSE, CAPILLARY
Glucose-Capillary: 117 mg/dL — ABNORMAL HIGH (ref 70–99)
Glucose-Capillary: 118 mg/dL — ABNORMAL HIGH (ref 70–99)
Glucose-Capillary: 121 mg/dL — ABNORMAL HIGH (ref 70–99)
Glucose-Capillary: 148 mg/dL — ABNORMAL HIGH (ref 70–99)
Glucose-Capillary: 160 mg/dL — ABNORMAL HIGH (ref 70–99)
Glucose-Capillary: 94 mg/dL (ref 70–99)

## 2021-01-23 LAB — CBC WITH DIFFERENTIAL/PLATELET
Abs Immature Granulocytes: 0.31 10*3/uL — ABNORMAL HIGH (ref 0.00–0.07)
Basophils Absolute: 0 10*3/uL (ref 0.0–0.1)
Basophils Relative: 0 %
Eosinophils Absolute: 0.4 10*3/uL (ref 0.0–0.5)
Eosinophils Relative: 4 %
HCT: 32.1 % — ABNORMAL LOW (ref 39.0–52.0)
Hemoglobin: 10.4 g/dL — ABNORMAL LOW (ref 13.0–17.0)
Immature Granulocytes: 3 %
Lymphocytes Relative: 15 %
Lymphs Abs: 1.5 10*3/uL (ref 0.7–4.0)
MCH: 24.1 pg — ABNORMAL LOW (ref 26.0–34.0)
MCHC: 32.4 g/dL (ref 30.0–36.0)
MCV: 74.3 fL — ABNORMAL LOW (ref 80.0–100.0)
Monocytes Absolute: 0.7 10*3/uL (ref 0.1–1.0)
Monocytes Relative: 6 %
Neutro Abs: 7.3 10*3/uL (ref 1.7–7.7)
Neutrophils Relative %: 72 %
Platelets: 314 10*3/uL (ref 150–400)
RBC: 4.32 MIL/uL (ref 4.22–5.81)
RDW: 14.5 % (ref 11.5–15.5)
WBC: 10.2 10*3/uL (ref 4.0–10.5)
nRBC: 0 % (ref 0.0–0.2)

## 2021-01-23 LAB — BASIC METABOLIC PANEL
Anion gap: 7 (ref 5–15)
BUN: 35 mg/dL — ABNORMAL HIGH (ref 6–20)
CO2: 20 mmol/L — ABNORMAL LOW (ref 22–32)
Calcium: 8.6 mg/dL — ABNORMAL LOW (ref 8.9–10.3)
Chloride: 112 mmol/L — ABNORMAL HIGH (ref 98–111)
Creatinine, Ser: 1.49 mg/dL — ABNORMAL HIGH (ref 0.61–1.24)
GFR, Estimated: 54 mL/min — ABNORMAL LOW (ref >60)
Glucose, Bld: 127 mg/dL — ABNORMAL HIGH (ref 70–99)
Potassium: 3.7 mmol/L (ref 3.5–5.1)
Sodium: 139 mmol/L (ref 135–145)

## 2021-01-23 MED ORDER — CHLORHEXIDINE GLUCONATE 0.12% ORAL RINSE (MEDLINE KIT)
15.0000 mL | Freq: Two times a day (BID) | OROMUCOSAL | Status: DC
  Administered 2021-01-23 – 2021-03-04 (×80): 15 mL via OROMUCOSAL

## 2021-01-23 MED ORDER — ORAL CARE MOUTH RINSE
15.0000 mL | OROMUCOSAL | Status: DC
  Administered 2021-01-23 – 2021-03-04 (×374): 15 mL via OROMUCOSAL

## 2021-01-23 MED ORDER — FREE WATER
100.0000 mL | Status: DC
  Administered 2021-01-23 – 2021-02-02 (×58): 100 mL

## 2021-01-23 NOTE — Progress Notes (Addendum)
1300: Patient arrived to [facility] with no patient belongings. See documentation below for new pressure injury found on arrival.    01/23/21 1300  Pressure Injury 01/23/21 Anus Medial Stage 2 -  Partial thickness loss of dermis presenting as a shallow open injury with a red, pink wound bed without slough. Two red open areas approximately 0.5x1cm related to flexiseal  Date First Assessed/Time First Assessed: 01/23/21 1300   Location: Anus  Location Orientation: Medial  Staging: Stage 2 -  Partial thickness loss of dermis presenting as a shallow open injury with a red, pink wound bed without slough.  Wound Descripti...  Dressing Type Foam - Lift dressing to assess site every shift  Dressing Changed;Clean;Dry;Intact  Dressing Change Frequency Every 3 days  Site / Wound Assessment Bleeding;Red  Peri-wound Assessment Intact  Wound Length (cm) 0.5 cm  Wound Width (cm) 1 cm  Wound Depth (cm) 0 cm  Wound Surface Area (cm^2) 0.5 cm^2  Wound Volume (cm^3) 0 cm^3  Tunneling (cm) 0  Margins Unattached edges (unapproximated)  Drainage Amount Minimal  Drainage Description Sanguineous  Treatment Cleansed;Other (Comment) (New foam applied)   Update: 1700: Foley catheter noted to have been in place since 01/16/21. RN spoke with Dr. Wynona Neat regarding continued foley need. Order to replace foley catheter given. Foley catheter replaced and continue foley order given.

## 2021-01-23 NOTE — Progress Notes (Addendum)
[facility] PROGRESS NOTE  Stephen Delgado Jose OEH:[id number] DOB: 11-17-1965 DOA: 12/30/2020 PCP: Patient, [name]  Status: Remains inpatient appropriate because:  Unsafe discharge plan.  Patient in persistent vegetative state per stroke needs placement at skilled nursing facility that is trach capable  Barriers to discharge: Social: Current insurance coverage about to expire and he otherwise has no funding for long-term care.  Clarifying regarding application for disability and Medicaid  Clinical: Tracheostomy and PEG dependent  Level of care:  Progressive but transferring back to ICU for mechanical ventilation and stat CT of head due to neurological change as of 12/23   Code Status: Full code Family Communication: Updated wife at bedside as to rationale why patient needed to transfer back to ICU and undergo a CT scan.  Made her aware that change in respiratory pattern could be indicative of worsening status that could lead to brain herniation and eventual brain death/body death DVT prophylaxis: SCDs COVID vaccination status: Unknown  Foley catheter: 31 French inserted on 12/17 POA: No  HPI: 55 y.o. male with PMH significant for DM2, HTN, CKD with questionable compliance to medications Patient presented to the ED on 12/30/2020 with complaint of headache, altered mental status   In the ED, he was hypertensive to 238/160, agitated, required restraints CT head showed large amount of intraventricular hemorrhage involving lateral/third/fourth ventricle, ICH and left basal ganglia as well as chronic microvascular ischemic changes. He was started on Cleviprex drip and admitted to neuro ICU   He subsequently had CT head repeated on 11/30, 12/1 and 12/3, all demonstrated unchanged intraparenchymal hemorrhage and intraventricular hemorrhage 12/3, MRI brain from also showed numerous small acute infarcts in both cerebral hemisphere with minimal improvement in the right  superior cerebellum 12/3, intubated 12/13, tracheostomy 12/15, wean from vent to trach collar 12/16, PEG tube placement 12/16, repeat MRI showed an increase in the size and number of acute infarcts in the bilateral hemispheres with increased involvement in the cerebellum bilaterally.   12/18, transferred from neuro service to [facility]  12/21, palliative care consulted.  Family wanted to continue aggressive care as full CODE STATUS. 12/23 developed Cheyne-Stokes respiratory pattern with respiratory alkalosis.  Since patient remains full code discussed with pulmonary medicine and decision was made to transfer back to the ICU so mechanical ventilation can be instituted and patient can undergo stat CT of the head   His hospital course has been complicated by significant neurological impairment, aspiration events, AKI, hypernatremia, urinary retention.  Subjective: Unresponsive  Objective: Vitals:   01/23/21 0840 01/23/21 1052  BP: 133/72   Pulse: 97 93  Resp: 16 (!) 30  Temp:    SpO2: 98% 100%    Intake/Output Summary (Last 24 hours) at 01/23/2021 1119 Last data filed at 01/23/2021 0530 Gross per 24 hour  Intake 1875.83 ml  Output 2335 ml  Net -459.17 ml   Filed Weights   01/21/21 0500 01/22/21 0436 01/23/21 0527  Weight: 95.8 kg 93 kg 89.3 kg    Exam:  Constitutional: Remains unresponsive Respiratory: #8.0 Shiley cuffed trach, neurogenic cyclic respiratory pattern consistent with Cheyne-Stokes, bilateral coarse lung sounds.  Room air Cardiovascular: Regular, sinus rhythm, no murmurs / rubs / gallops. No extremity edema. 2+ pedal pulses.  Abdomen: no tenderness, no masses palpated.  PEG tube for feedings bowel sounds positive. LBM 12/22-rectal Foley in place with brown drainage Neurologic: Unable to accurately test cranial nerves.  Eyes closed.  No blink reflex when tested and both eyes noted deviated downward  and mostly medial.  Unresponsive even to painful stimulus Psychiatric:  Unresponsive   Assessment/Plan: Acute problems: Acute intraparenchymal and intraventricular hemorrhage Bilateral multifocal ischemic infarcts -Ischemic and hemorrhagic stroke likely from combination of uncontrolled hypertension and small vessel disease.  -no improvement in multiple repeat imagings.  The MRI from 12/16 actually showed an increase in size and number of acute infarcts. -No evidence of intracranial vascular stenosis or occlusion. -Neurology has signed off as of 12/22 as they have nothing further to offer.  Currently on no blood thinners because of intracranial hemorrhage. -12/23 having neurogenic Cheyne-Stokes respiratory pattern and seems less responsive than previous reported in now not responding to any pain stimulus and eyes will not open even the pain.  As a precaution pulmonary medicine will be obtaining an ABG. @'@ABG'  consistent with respiratory alkalosis with a pH of 7.505 and a PCO2 of 24.6, PaO2 normal.  Elevated ABG at 3.4 with slightly low bicarbonate 19.2--discussed with PCCM and decision made to transfer back to ICU for mechanical ventilation and stat CT of the head given the patient remains full code and aggressive management requested per family based on recent palliative care meeting     Acute respiratory failure with hypoxia MSSA and serratia pneumonia  -Remains with cuffed trach to room air trach collar -Completed Zosyn on 12/21 -Routine trach care.   -PCCM following -As above-no leukocytosis or fever   Essential hypertension -Blood pressure on admission was elevated to over 771 systolic. -Currently controlled on Coreg and clonidine.   -Has IV Apresoline and Normodyne available prn   AKI on CKD 3b -Baseline renal function: January 2022 BUN was 18 and creatinine 1.49 -Current creatinine 1.49 with a BUN of 35   Hypernatremia -Na+ peak 154 with current reading 139 water -We will decrease free water from 200 every 4 hours to 100 every 4 hours to avoid  rebound hyponatremia -Repeat labs on 12/25   Type 2 diabetes mellitus -A1c 6.3 in 12/31/2020 -Continue Levemir 20 units BID along with SSI every 4 hours   Protein calorie malnutrition/dysphagia Nutrition Problem: Inadequate oral intake Etiology: lethargy/confusion, dysphagia Signs/Symptoms: NPO status Interventions: Tube feeding via PEG Body mass index is 26.7 kg/m.    Acute urinary retention  -Continue Urecholine -Currently has Foley catheter.   Goals of care -Poor prognosis.  No neurological recovery in last several days.  In fact repeat MRI on 12/16 shows worsening stroke compared to scan on admission.  -Last seen by palliative on 12/11. -I had a long discussion with patient's son on 12/19.  Palliative care met with patient's son and wife on 12/21.  They want to continue aggressive care with FULL CODE STATUS at this time.   -On 12/23 noted with Cheyne-Stokes respiratory pattern which appears to be new.  PCCM aware and obtaining CBC and ABG      Data Reviewed: Basic Metabolic Panel: Recent Labs  Lab 01/17/21 0342 01/18/21 0456 01/18/21 1230 01/18/21 1807 01/19/21 0343 01/20/21 0246 01/23/21 0159  NA 156* 155* 154* 152* 148* 146* 139  K 5.0 4.0  --   --  3.9 4.1 3.7  CL 121* 121*  --   --  117* 114* 112*  CO2 23 24  --   --  21* 24 20*  GLUCOSE 129* 160*  --   --  159* 123* 127*  BUN 61* 56*  --   --  54* 47* 35*  CREATININE 2.02* 2.04*  --   --  2.08* 1.89* 1.49*  CALCIUM 8.8*  8.8*  --   --  8.2* 8.5* 8.6*    CBC: Recent Labs  Lab 01/19/21 0343 01/20/21 0246 01/21/21 0232 01/22/21 0206 01/23/21 0159  WBC 10.0 10.0 9.7 10.1 10.2  NEUTROABS 7.4 7.5 7.2 7.4 7.3  HGB 8.9* 9.8* 9.4* 10.4* 10.4*  HCT 28.9* 31.2* 29.8* 32.9* 32.1*  MCV 77.3* 75.7* 75.3* 75.3* 74.3*  PLT 381 360 331 339 314     CBG: Recent Labs  Lab 01/22/21 1144 01/22/21 1640 01/22/21 2024 01/22/21 2338 01/23/21 0513  GLUCAP 130* 118* 132* 125* 118*    Recent Results (from the  past 240 hour(s))  Culture, blood (Routine X 2) w Reflex to ID Panel     Status: None   Collection Time: 01/14/21  7:33 PM   Specimen: BLOOD LEFT HAND  Result Value Ref Range Status   Specimen Description BLOOD LEFT HAND  Final   Special Requests   Final    BOTTLES DRAWN AEROBIC ONLY Blood Culture adequate volume   Culture   Final    NO GROWTH 5 DAYS Performed at [facility], [place] [address], [place], [place] [address]    Report Status 01/19/2021 FINAL  Final  Culture, blood (Routine X 2) w Reflex to ID Panel     Status: None   Collection Time: 01/14/21  7:33 PM   Specimen: BLOOD RIGHT HAND  Result Value Ref Range Status   Specimen Description BLOOD RIGHT HAND  Final   Special Requests   Final    BOTTLES DRAWN AEROBIC ONLY Blood Culture results may not be optimal due to an inadequate volume of blood received in culture bottles   Culture   Final    NO GROWTH 5 DAYS Performed at [facility], [place] [address], [place], [place] [address]    Report Status 01/19/2021 FINAL  Final  Culture, Respiratory w Gram Stain     Status: None   Collection Time: 01/15/21  1:09 PM   Specimen: Tracheal Aspirate; Respiratory  Result Value Ref Range Status   Specimen Description TRACHEAL ASPIRATE  Final   Special Requests NONE  Final   Gram Stain   Final    NO WBC SEEN NO ORGANISMS SEEN Performed at [facility], [address] [place], [place] [address]    Culture   Final    RARE SERRATIA MARCESCENS RARE STAPHYLOCOCCUS AUREUS    Report Status 01/18/2021 FINAL  Final   Organism ID, Bacteria SERRATIA MARCESCENS  Final   Organism ID, Bacteria STAPHYLOCOCCUS AUREUS  Final      Susceptibility   Staphylococcus aureus - MIC*    CIPROFLOXACIN <=0.5 SENSITIVE Sensitive     ERYTHROMYCIN RESISTANT Resistant     GENTAMICIN <=0.5 SENSITIVE Sensitive     OXACILLIN 0.5 SENSITIVE Sensitive     TETRACYCLINE <=1 SENSITIVE Sensitive     VANCOMYCIN <=0.5 SENSITIVE Sensitive      TRIMETH/SULFA <=10 SENSITIVE Sensitive     CLINDAMYCIN RESISTANT Resistant     RIFAMPIN <=0.5 SENSITIVE Sensitive     Inducible Clindamycin POSITIVE Resistant     * RARE STAPHYLOCOCCUS AUREUS   Serratia marcescens - MIC*    CEFAZOLIN >=64 RESISTANT Resistant     CEFEPIME <=0.12 SENSITIVE Sensitive     CEFTAZIDIME <=1 SENSITIVE Sensitive     CEFTRIAXONE <=0.25 SENSITIVE Sensitive     CIPROFLOXACIN <=0.25 SENSITIVE Sensitive     GENTAMICIN <=1 SENSITIVE Sensitive     TRIMETH/SULFA <=20 SENSITIVE Sensitive     *  RARE SERRATIA MARCESCENS     Studies: No results found.  Scheduled Meds:  bethanechol  25 mg Per Tube TID   carvedilol  25 mg Per Tube BID WC   chlorhexidine  15 mL Mouth Rinse BID   Chlorhexidine Gluconate Cloth  6 each Topical Q0600   cloNIDine  0.1 mg Per Tube Q8H   feeding supplement (PROSource TF)  45 mL Per Tube Daily   free water  100 mL Per Tube Q4H   heparin injection (subcutaneous)  5,000 Units Subcutaneous Q8H   insulin aspart  0-20 Units Subcutaneous Q4H   insulin detemir  20 Units Subcutaneous BID   mouth rinse  15 mL Mouth Rinse q12n4p   pantoprazole sodium  40 mg Per Tube Daily   Continuous Infusions:  feeding supplement (GLUCERNA 1.2 CAL) 1,000 mL (01/22/21 1749)    Principal Problem:   ICH (intracerebral hemorrhage) ([place]) Active Problems:   Malignant hypertension   Diabetes mellitus type 2 in obese ([place])   Acute respiratory failure ([place])   Tracheostomy dependence ([place])   Pneumonia due to Serratia marcescens ([place])   MSSA (methicillin susceptible Staphylococcus aureus) pneumonia ([place])   Stage 3b chronic kidney disease (CKD) ([place])   Acute kidney injury ([place])   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 ([place])   Acute hypernatremia   Dysphagia due to recent stroke   Acute urinary retention   Consultants: Neurology PCCM Medicine  Procedures: Echocardiogram EEG Cortrack PEG tube placement Tracheostomy tube  placement  Antibiotics: Zosyn 12/15 through 12/21   Time spent: 45 minutes    Erin Hearing ANP  [facility] 7 am - 330 pm/M-F for direct patient care and secure chat Please refer to Amion for contact info 24  days

## 2021-01-23 NOTE — Progress Notes (Signed)
NAME:  Stephen Delgado, MRN:  [id number], DOB:  Mar 12, 1965, LOS: 24 ADMISSION DATE:  12/30/2020, CONSULTATION DATE:  12/19 REFERRING MD:  Roda Shutters, CHIEF COMPLAINT:  Dyspnea   History of Present Illness:  55 yo male presented with headache, nausea and altered mental status.  CT head showed acute Lt ICH with IVH likely related to hypertensive emergency (BP 195/116).  Had persistent hypertension and concern for airway protection, and PCCM asked to assist with ICU management.  Tracheostomy 12/13.  Moved out of ICU.   Pertinent  Medical History  HTN, DM type 2, CKD 3a, OSA  Significant Hospital Events: Including procedures, antibiotic start and stop dates in addition to other pertinent events   11/29 Admit, start cleviprex 12/01 PCCM consulted; changed from cleviprex to cardene due to elevated triglycerides 12/03 intubated; episode of vomiting >> tube feeds held 12/04 resume trickle tube feeds 12/06 remains minimally responsive. Tolerating SBT.  12/12 no acute events overnight, T-max 102 point 12/13 bedside trach planned midmorning  12/14 ATC. Lasix --> 5L UOP  12/15 cont on trach collar. Na to 154 from 145. Adding low rate d5  12/16 scheduled for PEG. Cont D5. Continues on trach collar Out of ICU 12/19 12/23 Cheyne-Stokes respirations noted  Imaging/Studies: CT head 11/29 >> large amount of IVH involving lateral/3rd/4th ventricles, ICH in Lt BG, chronic microvascular ischemic changes Echo 11/30 >> EF 50 to 55%, mod LVH, grade 1 DD, ascending aorta 37 mm EEG 12/01 >> continuous generalized slowing MRI brain 12/03 >> numerous small acute infarcts in b/l cerebral hemispheres, Lt caudate hemorrhage, IVH, scattered sulcal SAH, mild communicating hydrocephalus 12/5 Started on free water for hypernatremia. Cefazolin for MSSA pneumonia.  Interim History / Subjective:   No overnight events Objective   Blood pressure 133/72, pulse 93, temperature 99.4 F (37.4 C), temperature source Axillary,  resp. rate (!) 30, height 6' (1.829 m), weight 89.3 kg, SpO2 100 %.    FiO2 (%):  [21 %] 21 %   Intake/Output Summary (Last 24 hours) at 01/23/2021 1151 Last data filed at 01/23/2021 0530 Gross per 24 hour  Intake 1875.83 ml  Output 2335 ml  Net -459.17 ml   Filed Weights   01/21/21 0500 01/22/21 0436 01/23/21 0527  Weight: 95.8 kg 93 kg 89.3 kg    Examination:  General: On trach collar HENT: NCAT tracheostomy in place PULM: Rhonchi bilaterally CV: S1-S2 appreciated GI: BS+, soft, nontender MSK: normal bulk and tone Neuro: Cheyne-Stokes respirations noted   Resolved Hospital Problem list     Assessment & Plan:   Acute basal left ganglia intraparenchymal hemorrhage with intraventricular hemorrhage in the setting of hypertensive emergency Encephalopathy 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 Pearlean Brownie previously Discussed with spouse  Patient noted to have Cheyne-Stokes respirations today ABG noted with respiratory alkalosis  Since patient is a still full code Transfer to 4 N. Placed on vent  Obtain CT head to look for herniation   Best Practice (right click and "Reselect all SmartList Selections" daily)   Per HPI  Labs   CBC: Recent Labs  Lab 01/19/21 0343 01/20/21 0246 01/21/21 0232 01/22/21 0206 01/23/21 0159  WBC 10.0 10.0 9.7 10.1 10.2  NEUTROABS 7.4 7.5 7.2 7.4 7.3  HGB 8.9* 9.8* 9.4* 10.4* 10.4*  HCT 28.9* 31.2* 29.8* 32.9* 32.1*  MCV 77.3*  75.7* 75.3* 75.3* 74.3*  PLT 381 360 331 339 [id number]    Basic Metabolic Panel: Recent Labs  Lab 01/17/21 0342 01/18/21 0456 01/18/21 1230 01/18/21 1807 01/19/21 0343 01/20/21 0246 01/23/21 0159  NA 156* 155* 154* 152* 148* 146* 139  K 5.0 4.0  --   --  3.9 4.1  3.7  CL 121* 121*  --   --  117* 114* 112*  CO2 23 24  --   --  21* 24 20*  GLUCOSE 129* 160*  --   --  159* 123* 127*  BUN 61* 56*  --   --  54* 47* 35*  CREATININE 2.02* 2.04*  --   --  2.08* 1.89* 1.49*  CALCIUM 8.8* 8.8*  --   --  8.2* 8.5* 8.6*   GFR: Estimated Creatinine Clearance: 60 mL/min (A) (by C-G formula based on SCr of 1.49 mg/dL (H)). Recent Labs  Lab 01/20/21 0246 01/21/21 0232 01/22/21 0206 01/23/21 0159  WBC 10.0 9.7 10.1 10.2    Liver Function Tests: No results for input(s): AST, ALT, ALKPHOS, BILITOT, PROT, ALBUMIN in the last 168 hours.  No results for input(s): LIPASE, AMYLASE in the last 168 hours. No results for input(s): AMMONIA in the last 168 hours.  ABG    Component Value Date/Time   PHART 7.505 (H) 01/23/2021 1050   PCO2ART 24.6 (L) 01/23/2021 1050   PO2ART 94.0 01/23/2021 1050   HCO3 19.2 (L) 01/23/2021 1050   TCO2 29 01/16/2021 1215   ACIDBASEDEF 3.4 (H) 01/23/2021 1050   O2SAT 98.2 01/23/2021 1050     Coagulation Profile: No results for input(s): INR, PROTIME in the last 168 hours.  Cardiac Enzymes: No results for input(s): CKTOTAL, CKMB, CKMBINDEX, TROPONINI in the last 168 hours.  HbA1C: Hemoglobin A1C  Date/Time Value Ref Range Status  08/18/2017 03:42 PM 5.7 (A) 4.0 - 5.6 % Final    Comment:    CBG 134  06/10/2016 02:58 PM 6.4  Final   Hgb A1c MFr Bld  Date/Time Value Ref Range Status  12/31/2020 02:40 AM 6.3 (H) 4.8 - 5.6 % Final    Comment:    (NOTE)         Prediabetes: 5.7 - 6.4         Diabetes: >6.4         Glycemic control for adults with diabetes: <7.0   04/19/2015 06:22 PM 6.0 (H) 4.8 - 5.6 % Final    Comment:    (NOTE)         Pre-diabetes: 5.7 - 6.4         Diabetes: >6.4         Glycemic control for adults with diabetes: <7.0     CBG: Recent Labs  Lab 01/22/21 1144 01/22/21 1640 01/22/21 2024 01/22/21 2338 01/23/21 0513  GLUCAP 130* 118* 132* 125* 118*   Sherrilyn Rist, MD [place]  PCCM Pager: See Shea Evans

## 2021-01-23 NOTE — Progress Notes (Signed)
Pt transported to CT and back to 4N30 without complications.

## 2021-01-23 NOTE — Plan of Care (Signed)
°  Problem: Education: Goal: Knowledge of General Education information will improve Description: Including pain rating scale, medication(s)/side effects and non-pharmacologic comfort measures Outcome: Not Progressing   Problem: Clinical Measurements: Goal: Respiratory complications will improve Outcome: Not Progressing   Problem: Education: Goal: Knowledge of disease or condition will improve Outcome: Not Progressing

## 2021-01-24 DIAGNOSIS — J9601 Acute respiratory failure with hypoxia: Secondary | ICD-10-CM | POA: Diagnosis not present

## 2021-01-24 LAB — CBC
HCT: 30.8 % — ABNORMAL LOW (ref 39.0–52.0)
Hemoglobin: 9.7 g/dL — ABNORMAL LOW (ref 13.0–17.0)
MCH: 23.8 pg — ABNORMAL LOW (ref 26.0–34.0)
MCHC: 31.5 g/dL (ref 30.0–36.0)
MCV: 75.7 fL — ABNORMAL LOW (ref 80.0–100.0)
Platelets: 279 10*3/uL (ref 150–400)
RBC: 4.07 MIL/uL — ABNORMAL LOW (ref 4.22–5.81)
RDW: 15.2 % (ref 11.5–15.5)
WBC: 12.7 10*3/uL — ABNORMAL HIGH (ref 4.0–10.5)
nRBC: 0 % (ref 0.0–0.2)

## 2021-01-24 LAB — BASIC METABOLIC PANEL
Anion gap: 8 (ref 5–15)
BUN: 37 mg/dL — ABNORMAL HIGH (ref 6–20)
CO2: 20 mmol/L — ABNORMAL LOW (ref 22–32)
Calcium: 8.8 mg/dL — ABNORMAL LOW (ref 8.9–10.3)
Chloride: 112 mmol/L — ABNORMAL HIGH (ref 98–111)
Creatinine, Ser: 1.36 mg/dL — ABNORMAL HIGH (ref 0.61–1.24)
GFR, Estimated: >60 mL/min (ref >60)
Glucose, Bld: 141 mg/dL — ABNORMAL HIGH (ref 70–99)
Potassium: 4.3 mmol/L (ref 3.5–5.1)
Sodium: 140 mmol/L (ref 135–145)

## 2021-01-24 LAB — GLUCOSE, CAPILLARY
Glucose-Capillary: 128 mg/dL — ABNORMAL HIGH (ref 70–99)
Glucose-Capillary: 151 mg/dL — ABNORMAL HIGH (ref 70–99)
Glucose-Capillary: 146 mg/dL — ABNORMAL HIGH (ref 70–99)
Glucose-Capillary: 108 mg/dL — ABNORMAL HIGH (ref 70–99)

## 2021-01-24 MED ORDER — SODIUM CHLORIDE 0.9 % IV SOLN
2.0000 g | Freq: Three times a day (TID) | INTRAVENOUS | Status: DC
  Administered 2021-01-24 – 2021-01-27 (×10): 2 g via INTRAVENOUS
  Filled 2021-01-24 (×10): qty 2

## 2021-01-24 NOTE — Progress Notes (Addendum)
Pharmacy Antibiotic Note Stephen Delgado is a 55 y.o. male admitted on 12/30/2020 with headache, nausea and altered mental status.  CT head showed acute Lt ICH with IVH likely related to hypertensive emergency.  Overnight, patient developed respiratory failure. Pharmacy has been consulted for Cefepime dosing for concerns of HCAP  Plan: Cefepime 2g Q8H F/u trach cultures, clinical improvement   Height: 6' (182.9 cm) Weight: 88.3 kg (194 lb 10.7 oz) IBW/kg (Calculated) : 77.6  Temp (24hrs), Avg:99.8 F (37.7 C), Min:98 F (36.7 C), Max:101.1 F (38.4 C)  Recent Labs  Lab 01/18/21 0456 01/19/21 0343 01/20/21 0246 01/21/21 0232 01/22/21 0206 01/23/21 0159  WBC 8.6 10.0 10.0 9.7 10.1 10.2  CREATININE 2.04* 2.08* 1.89*  --   --  1.49*    Estimated Creatinine Clearance: 60 mL/min (A) (by C-G formula based on SCr of 1.49 mg/dL (H)).    No Known Allergies  Antimicrobials this admission: Zosyn 12/03 >> 1205, 12/15 >> 12/21 Cefazolin 12/05 >> 12/10  Microbiology results: 12/14 BCx: negative 12/15 Trach Asp: serratia marcescens, MSSA  12/24 Trach Asp: pending   Thank you for allowing pharmacy to be a part of this patients care.  Thelma Barge, PharmD Clinical Pharmacist

## 2021-01-24 NOTE — Progress Notes (Signed)
NAME:  Stephen Delgado, MRN:  [id number], DOB:  September 29, 1965, LOS: 25 ADMISSION DATE:  12/30/2020, CONSULTATION DATE:  12/19 REFERRING MD:  Erlinda Hong, CHIEF COMPLAINT:  Dyspnea   History of Present Illness:  55 yo male presented with headache, nausea and altered mental status.  CT head showed acute Lt ICH with IVH likely related to hypertensive emergency (BP 195/116).  Had persistent hypertension and concern for airway protection, and PCCM asked to assist with ICU management.  Tracheostomy 12/13.  Moved out of ICU.   Pertinent  Medical History  HTN, DM type 2, CKD 3a, OSA  Significant Hospital Events: Including procedures, antibiotic start and stop dates in addition to other pertinent events   11/29 Admit, start cleviprex 12/01 PCCM consulted; changed from cleviprex to cardene due to elevated triglycerides 12/03 intubated; episode of vomiting >> tube feeds held 12/04 resume trickle tube feeds 12/06 remains minimally responsive. Tolerating SBT.  12/12 no acute events overnight, T-max 102 point 12/13 bedside trach planned midmorning  12/14 ATC. Lasix --> 5L UOP  12/15 cont on trach collar. Na to 154 from 145. Adding low rate d5  12/16 scheduled for PEG. Cont D5. Continues on trach collar Out of ICU 12/19 12/23 Cheyne-Stokes respirations noted  Imaging/Studies: CT head 11/29 >> large amount of IVH involving lateral/3rd/4th ventricles, ICH in Lt BG, chronic microvascular ischemic changes Echo 11/30 >> EF 50 to 55%, mod LVH, grade 1 DD, ascending aorta 37 mm EEG 12/01 >> continuous generalized slowing MRI brain 12/03 >> numerous small acute infarcts in b/l cerebral hemispheres, Lt caudate hemorrhage, IVH, scattered sulcal SAH, mild communicating hydrocephalus 12/5 Started on free water for hypernatremia. Cefazolin for MSSA pneumonia.  Interim History / Subjective:   No overnight events Objective   Blood pressure (!) 166/88, pulse (!) 103, temperature (!) 101.1 F (38.4 C), temperature source  Oral, resp. rate (!) 27, height 6' (1.829 m), weight 88.3 kg, SpO2 100 %.    Vent Mode: PSV;CPAP FiO2 (%):  [21 %-40 %] 40 % Set Rate:  [16 bmp-18 bmp] 18 bmp Vt Set:  [620 mL] 620 mL PEEP:  [5 cmH20] 5 cmH20 Pressure Support:  [5 cmH20-10 cmH20] 5 cmH20 Plateau Pressure:  [15 cmH20-17 cmH20] 15 cmH20   Intake/Output Summary (Last 24 hours) at 01/24/2021 0911 Last data filed at 01/24/2021 0800 Gross per 24 hour  Intake 1137.17 ml  Output 2015 ml  Net -877.83 ml   Filed Weights   01/22/21 0436 01/23/21 0527 01/24/21 0359  Weight: 93 kg 89.3 kg 88.3 kg    Examination:  General: On the vent HENT: NCAT 8-0 shiley tracheostomy in place PULM: Rhonchi bilaterally, loads of secretions in-line and oral CV: S1-S2 appreciated GI: BS+, soft, nontender Ext: edema over feet, warm MSK: normal bulk and tone Neuro: +gag, corneals   Resolved Hospital Problem list     Assessment & Plan:   # Acute basal left ganglia intraparenchymal hemorrhage with intraventricular hemorrhage in the setting of hypertensive emergency # 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 Leonie Man previously, spouse - continue clonidine, coreg - SSI   Best Practices:  Diet/type: tubefeeds DVT prophylaxis: ppx heparin GI prophylaxis: PPI Lines: N/A Foley:  N/A Code Status:  full code Last date of multidisciplinary goals of care discussion [12/21, palliative care ]  Critical care time: 32 minutes         Laroy Apple Pulmonary/Critical Care  [place]  PCCM Pager: See Loretha Stapler

## 2021-01-24 NOTE — Progress Notes (Signed)
Pt was placed on 21%/5L trach collar. Pt is tolerating well at this time.

## 2021-01-25 DIAGNOSIS — Z66 Do not resuscitate: Secondary | ICD-10-CM | POA: Diagnosis not present

## 2021-01-25 DIAGNOSIS — Z930 Tracheostomy status: Secondary | ICD-10-CM | POA: Diagnosis not present

## 2021-01-25 DIAGNOSIS — I615 Nontraumatic intracerebral hemorrhage, intraventricular: Secondary | ICD-10-CM | POA: Diagnosis not present

## 2021-01-25 DIAGNOSIS — Z515 Encounter for palliative care: Secondary | ICD-10-CM | POA: Diagnosis not present

## 2021-01-25 DIAGNOSIS — Z20822 Contact with and (suspected) exposure to covid-19: Secondary | ICD-10-CM | POA: Diagnosis not present

## 2021-01-25 LAB — CBC
HCT: 29.7 % — ABNORMAL LOW (ref 39.0–52.0)
Hemoglobin: 9.6 g/dL — ABNORMAL LOW (ref 13.0–17.0)
MCH: 24.1 pg — ABNORMAL LOW (ref 26.0–34.0)
MCHC: 32.3 g/dL (ref 30.0–36.0)
MCV: 74.4 fL — ABNORMAL LOW (ref 80.0–100.0)
Platelets: 253 10*3/uL (ref 150–400)
RBC: 3.99 MIL/uL — ABNORMAL LOW (ref 4.22–5.81)
RDW: 15.3 % (ref 11.5–15.5)
WBC: 11.9 10*3/uL — ABNORMAL HIGH (ref 4.0–10.5)
nRBC: 0 % (ref 0.0–0.2)

## 2021-01-25 LAB — BASIC METABOLIC PANEL
Anion gap: 8 (ref 5–15)
BUN: 35 mg/dL — ABNORMAL HIGH (ref 6–20)
CO2: 20 mmol/L — ABNORMAL LOW (ref 22–32)
Calcium: 8.8 mg/dL — ABNORMAL LOW (ref 8.9–10.3)
Chloride: 111 mmol/L (ref 98–111)
Creatinine, Ser: 1.28 mg/dL — ABNORMAL HIGH (ref 0.61–1.24)
GFR, Estimated: >60 mL/min (ref >60)
Glucose, Bld: 161 mg/dL — ABNORMAL HIGH (ref 70–99)
Potassium: 4.1 mmol/L (ref 3.5–5.1)
Sodium: 139 mmol/L (ref 135–145)

## 2021-01-25 LAB — GLUCOSE, CAPILLARY
Glucose-Capillary: 116 mg/dL — ABNORMAL HIGH (ref 70–99)
Glucose-Capillary: 125 mg/dL — ABNORMAL HIGH (ref 70–99)
Glucose-Capillary: 126 mg/dL — ABNORMAL HIGH (ref 70–99)
Glucose-Capillary: 128 mg/dL — ABNORMAL HIGH (ref 70–99)
Glucose-Capillary: 157 mg/dL — ABNORMAL HIGH (ref 70–99)

## 2021-01-25 MED ORDER — NUTRISOURCE FIBER PO PACK
1.0000 | PACK | Freq: Two times a day (BID) | ORAL | Status: DC
  Administered 2021-01-25 – 2021-03-21 (×111): 1
  Filled 2021-01-25 (×115): qty 1

## 2021-01-25 NOTE — Progress Notes (Addendum)
Dr.Meier ordered 12lead EKG and came to bedside to assess; continue monitoring

## 2021-01-25 NOTE — Progress Notes (Signed)
NAME:  Stephen Delgado, MRN:  [id number], DOB:  [birth year], LOS: 26 ADMISSION DATE:  12/30/2020, CONSULTATION DATE:  12/19 REFERRING MD:  Roda Shutters, CHIEF COMPLAINT:  Dyspnea   History of Present Illness:  55 yo male presented with headache, nausea and altered mental status.  CT head showed acute Lt ICH with IVH likely related to hypertensive emergency (BP 195/116).  Had persistent hypertension and concern for airway protection, and PCCM asked to assist with ICU management.  Tracheostomy 12/13.  Moved out of ICU.   Pertinent  Medical History  HTN, DM type 2, CKD 3a, OSA  Significant Hospital Events: Including procedures, antibiotic start and stop dates in addition to other pertinent events   11/29 Admit, start cleviprex 12/01 PCCM consulted; changed from cleviprex to cardene due to elevated triglycerides 12/03 intubated; episode of vomiting >> tube feeds held 12/04 resume trickle tube feeds 12/06 remains minimally responsive. Tolerating SBT.  12/12 no acute events overnight, T-max 102 point 12/13 bedside trach planned midmorning  12/14 ATC. Lasix --> 5L UOP  12/15 cont on trach collar. Na to 154 from 145. Adding low rate d5  12/16 scheduled for PEG. Cont D5. Continues on trach collar Out of ICU 12/19 12/23 Cheyne-Stokes respirations noted  Imaging/Studies: CT head 11/29 >> large amount of IVH involving lateral/3rd/4th ventricles, ICH in Lt BG, chronic microvascular ischemic changes Echo 11/30 >> EF 50 to 55%, mod LVH, grade 1 DD, ascending aorta 37 mm EEG 12/01 >> continuous generalized slowing MRI brain 12/03 >> numerous small acute infarcts in b/l cerebral hemispheres, Lt caudate hemorrhage, IVH, scattered sulcal SAH, mild communicating hydrocephalus 12/5 Started on free water for hypernatremia. Cefazolin for MSSA pneumonia.  Interim History / Subjective:   No acute issues. Tolerated humidified medical air overnight well. Still has cheyne stokes vs ataxic breathing  pattern.  Objective   Blood pressure 136/89, pulse 83, temperature 98.6 F (37 C), temperature source Axillary, resp. rate 18, height 6' (1.829 m), weight 88.3 kg, SpO2 100 %.    FiO2 (%):  [21 %] 21 %   Intake/Output Summary (Last 24 hours) at 01/25/2021 1052 Last data filed at 01/25/2021 1000 Gross per 24 hour  Intake 2983.7 ml  Output 1700 ml  Net 1283.7 ml   Filed Weights   01/22/21 0436 01/23/21 0527 01/24/21 0359  Weight: 93 kg 89.3 kg 88.3 kg    Examination:  General: On the vent HENT: NCAT 8-0 shiley tracheostomy in place PULM: Rhonchi bilaterally, fewer but still significant secretions in-line and oral CV: S1-S2 appreciated GI: BS+, soft, nontender Ext: edema over feet, warm MSK: normal bulk and tone Neuro: +gag, corneals  Tracheal aspirate with serratia, klebsiella reincubated  Resolved Hospital Problem list     Assessment & Plan:   # Acute basal left ganglia intraparenchymal hemorrhage with intraventricular hemorrhage in the setting of hypertensive emergency # 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 Pearlean Brownie previously, spouse - continue clonidine, coreg - SSI  Best Practices:  Diet/type: tubefeeds DVT prophylaxis: ppx heparin GI prophylaxis: PPI Lines: N/A Foley:  N/A Code Status:  full code Last date of multidisciplinary goals of care discussion [12/21, palliative care  ]  Critical care time: 30 minutes         [place] PCCM Pager: See Shea Evans

## 2021-01-25 NOTE — Progress Notes (Signed)
Telemetry monitoring reporting ST elevation at 3.2; notifying MD

## 2021-01-25 NOTE — Progress Notes (Signed)
04 CBG not transferring over. Result per machine : 110

## 2021-01-26 ENCOUNTER — Other Ambulatory Visit (HOSPITAL_COMMUNITY): Payer: 59

## 2021-01-26 ENCOUNTER — Inpatient Hospital Stay (HOSPITAL_COMMUNITY): Payer: 59

## 2021-01-26 ENCOUNTER — Encounter (HOSPITAL_COMMUNITY): Payer: Self-pay | Admitting: Neurology

## 2021-01-26 ENCOUNTER — Telehealth: Payer: Self-pay | Admitting: Nurse Practitioner

## 2021-01-26 DIAGNOSIS — R140 Abdominal distension (gaseous): Secondary | ICD-10-CM | POA: Insufficient documentation

## 2021-01-26 DIAGNOSIS — I619 Nontraumatic intracerebral hemorrhage, unspecified: Secondary | ICD-10-CM | POA: Diagnosis not present

## 2021-01-26 DIAGNOSIS — R9431 Abnormal electrocardiogram [ECG] [EKG]: Secondary | ICD-10-CM

## 2021-01-26 DIAGNOSIS — I610 Nontraumatic intracerebral hemorrhage in hemisphere, subcortical: Secondary | ICD-10-CM | POA: Diagnosis not present

## 2021-01-26 DIAGNOSIS — R338 Other retention of urine: Secondary | ICD-10-CM | POA: Diagnosis not present

## 2021-01-26 LAB — CULTURE, RESPIRATORY W GRAM STAIN

## 2021-01-26 LAB — TROPONIN I (HIGH SENSITIVITY)
Troponin I (High Sensitivity): 42 ng/L — ABNORMAL HIGH (ref <18)
Troponin I (High Sensitivity): 52 ng/L — ABNORMAL HIGH (ref <18)

## 2021-01-26 LAB — BASIC METABOLIC PANEL
Anion gap: 9 (ref 5–15)
BUN: 36 mg/dL — ABNORMAL HIGH (ref 6–20)
CO2: 19 mmol/L — ABNORMAL LOW (ref 22–32)
Calcium: 8.8 mg/dL — ABNORMAL LOW (ref 8.9–10.3)
Chloride: 112 mmol/L — ABNORMAL HIGH (ref 98–111)
Creatinine, Ser: 1.34 mg/dL — ABNORMAL HIGH (ref 0.61–1.24)
GFR, Estimated: >60 mL/min (ref >60)
Glucose, Bld: 125 mg/dL — ABNORMAL HIGH (ref 70–99)
Potassium: 4 mmol/L (ref 3.5–5.1)
Sodium: 140 mmol/L (ref 135–145)

## 2021-01-26 LAB — GLUCOSE, CAPILLARY
Glucose-Capillary: 121 mg/dL — ABNORMAL HIGH (ref 70–99)
Glucose-Capillary: 121 mg/dL — ABNORMAL HIGH (ref 70–99)
Glucose-Capillary: 144 mg/dL — ABNORMAL HIGH (ref 70–99)
Glucose-Capillary: 149 mg/dL — ABNORMAL HIGH (ref 70–99)
Glucose-Capillary: 149 mg/dL — ABNORMAL HIGH (ref 70–99)
Glucose-Capillary: 155 mg/dL — ABNORMAL HIGH (ref 70–99)

## 2021-01-26 LAB — CBC
HCT: 28.9 % — ABNORMAL LOW (ref 39.0–52.0)
Hemoglobin: 9.3 g/dL — ABNORMAL LOW (ref 13.0–17.0)
MCH: 24.2 pg — ABNORMAL LOW (ref 26.0–34.0)
MCHC: 32.2 g/dL (ref 30.0–36.0)
MCV: 75.1 fL — ABNORMAL LOW (ref 80.0–100.0)
Platelets: 234 10*3/uL (ref 150–400)
RBC: 3.85 MIL/uL — ABNORMAL LOW (ref 4.22–5.81)
RDW: 15.5 % (ref 11.5–15.5)
WBC: 11.4 10*3/uL — ABNORMAL HIGH (ref 4.0–10.5)
nRBC: 0 % (ref 0.0–0.2)

## 2021-01-26 MED ORDER — HYDRALAZINE HCL 25 MG PO TABS
25.0000 mg | ORAL_TABLET | Freq: Three times a day (TID) | ORAL | Status: DC
  Administered 2021-01-26 – 2021-02-04 (×27): 25 mg
  Filled 2021-01-26 (×27): qty 1

## 2021-01-26 NOTE — Progress Notes (Addendum)
[facility] PROGRESS NOTE  Rishit Burkhalter Fear ONG:[id number] DOB: July 06, 1965 DOA: 12/30/2020 PCP: Patient, [name]  Status: Remains inpatient appropriate because:  Unsafe discharge plan.  Patient in persistent vegetative state per stroke needs placement at skilled nursing facility that is trach capable  Barriers to discharge: Social: Current insurance coverage about to expire and he otherwise has no funding for long-term care.  Clarifying regarding application for disability and Medicaid  Clinical: Tracheostomy and PEG dependent  Level of care:  Med-Surg but transferring back to ICU for mechanical ventilation and stat CT of head due to neurological change as of 12/23   Code Status: Full code Family Communication: Updated wife at bedside as to rationale why patient needed to transfer back to ICU and undergo a CT scan.  Made her aware that change in respiratory pattern could be indicative of worsening status that could lead to brain herniation and eventual brain death/body death DVT prophylaxis: SCDs COVID vaccination status: Unknown  Foley catheter: 47 French inserted on 12/17 POA: No  HPI: 55 y.o. male with PMH significant for DM2, HTN, CKD with questionable compliance to medications Patient presented to the ED on 12/30/2020 with complaint of headache, altered mental status   In the ED, he was hypertensive to 238/160, agitated, required restraints CT head showed large amount of intraventricular hemorrhage involving lateral/third/fourth ventricle, ICH and left basal ganglia as well as chronic microvascular ischemic changes. He was started on Cleviprex drip and admitted to neuro ICU   He subsequently had CT head repeated on 11/30, 12/1 and 12/3, all demonstrated unchanged intraparenchymal hemorrhage and intraventricular hemorrhage 12/3, MRI brain from also showed numerous small acute infarcts in both cerebral hemisphere with minimal improvement in the right  superior cerebellum 12/3, intubated 12/13, tracheostomy 12/15, wean from vent to trach collar 12/16, PEG tube placement 12/16, repeat MRI showed an increase in the size and number of acute infarcts in the bilateral hemispheres with increased involvement in the cerebellum bilaterally.   12/18, transferred from neuro service to [facility]  12/21, palliative care consulted.  Family wanted to continue aggressive care as full CODE STATUS. 12/23 developed Cheyne-Stokes respiratory pattern with respiratory alkalosis.  Transferred to ICU for short-term mechanical ventilation and was found to have new tracheobronchitis now on cefepime   His hospital course has been complicated by significant neurological impairment, aspiration events, AKI, hypernatremia, urinary retention.  Subjective: Unresponsive but respiratory pattern has improved significantly when compared with on 12/20  Objective: Vitals:   01/25/21 2342 01/26/21 0356  BP: (!) 156/88 (!) 154/94  Pulse: 90 96  Resp: 20 20  Temp: 99.2 F (37.3 C) 99.4 F (37.4 C)  SpO2: 98% 96%    Intake/Output Summary (Last 24 hours) at 01/26/2021 0755 Last data filed at 01/26/2021 0100 Gross per 24 hour  Intake 844.83 ml  Output 1025 ml  Net -180.17 ml   Filed Weights   01/23/21 0527 01/24/21 0359 01/26/21 0500  Weight: 89.3 kg 88.3 kg 89.4 kg    Exam:  Constitutional: Remains unresponsive Respiratory: #8.0 Shiley cuffed trach, tachypneic respiratory pattern similar to [place], no further apnea.  Thick tracheal secretions noted.  Bilateral expiratory rhonchi Cardiovascular: S1-S2, sinus tachycardia, normotensive, no peripheral edema Abdomen: no tenderness, no masses palpated.  Abdomen slightly distended but soft.  PEG tube for feedings bowel sounds positive. LBM 12/26-rectal Foley in place with brown drainage.  KUB on 1226 with nonspecific bowel gas pattern with gas present in colon and rectum. Neurologic: Unable to accurately  test cranial  nerves.  Eyes closed.  No blink reflex when tested.  Both eyes are midline pupils slow to respond bilaterally.  Unresponsive even to painful stimulus Psychiatric: Unresponsive   Assessment/Plan: Acute problems: Acute intraparenchymal and intraventricular hemorrhage Bilateral multifocal ischemic infarcts -Imaging and admission reviewed ischemic and hemorrhagic stroke likely from combination of uncontrolled hypertension and small vessel disease.  -no improvement in multiple repeat imagings.  Follow-up MRI on 12/16 demonstrated increase in size and number of acute infarcts  -Neurology has signed off as of 12/22 as they have nothing further to offer.  Currently on no blood thinners because of intracranial hemorrhage. -12/23 recent Cheyne-Stokes respiratory pattern combination of underlying neurological compromise as well as acute respiratory infection  Acute respiratory failure with hypoxia: 1) recent MSSA and serratia pneumonia  2) acute Klebsiella and Serratia tracheobronchitis -Completed Zosyn on 12/21 -Transferred to ICU on 12/23 as noted above and was found to have acute tracheobronchitis.  Required short-term mechanical ventilation but now back on trach collar -Klebsiella resistant to ampicillin and Serratia resistant to cefazolin.  Continue cefepime-duration of course of antibiotics at discretion of pulmonary medicine  Abnormal EKG -Initially read as new/worsened ST segment elevation compared to prior. -Cardiology consulted and upon further review felt EKG changes were not new and related to LVH -Troponin stable and otherwise no additional work-up indicated especially given patient is not a candidate for aggressive intervention such as cardiac catheterization given recent intracranial bleed  Essential hypertension -Blood pressure on admission was elevated to over 030 systolic. -Currently controlled on max dose Coreg and clonidine -Add hydralazine per tube given inadequately controlled  diastolic hypertension and known LVH -Has IV Apresoline and Normodyne available prn   AKI on CKD 3b -Baseline renal function: January 2022 BUN was 18 and creatinine 1.49 -Current creatinine 1.34 with a BUN of 36   Hypernatremia Resolved -Na+ peak 154 with current reading 140   Type 2 diabetes mellitus -A1c 6.3 in 12/31/2020 -Continue Levemir 20 units BID along with SSI every 4 hours   Protein calorie malnutrition/dysphagia Nutrition Problem: Inadequate oral intake Etiology: lethargy/confusion, dysphagia Signs/Symptoms: NPO status Interventions: Tube feeding via PEG Body mass index is 26.73 kg/m.    Acute urinary retention  -Continue Urecholine -Currently has Foley catheter. -Once has recovered from current episode of tracheobronchitis can begin Foley clamping trials to see if can remove Foley   Goals of care -Poor prognosis.  No neurological recovery in last several days.  In fact repeat MRI on 12/16 shows worsening stroke compared to scan on admission.  -Multiple discussions held with family per pulmonary medicine team as well as palliative medicine. They want to continue aggressive care with FULL CODE STATUS at this time.         Data Reviewed: Basic Metabolic Panel: Recent Labs  Lab 01/20/21 0246 01/23/21 0159 01/24/21 1027 01/25/21 0836 01/26/21 0107  NA 146* 139 140 139 140  K 4.1 3.7 4.3 4.1 4.0  CL 114* 112* 112* 111 112*  CO2 24 20* 20* 20* 19*  GLUCOSE 123* 127* 141* 161* 125*  BUN 47* 35* 37* 35* 36*  CREATININE 1.89* 1.49* 1.36* 1.28* 1.34*  CALCIUM 8.5* 8.6* 8.8* 8.8* 8.8*    CBC: Recent Labs  Lab 01/20/21 0246 01/21/21 0232 01/22/21 0206 01/23/21 0159 01/24/21 1027 01/25/21 0836 01/26/21 0107  WBC 10.0 9.7 10.1 10.2 12.7* 11.9* 11.4*  NEUTROABS 7.5 7.2 7.4 7.3  --   --   --   HGB 9.8* 9.4* 10.4* 10.4*  9.7* 9.6* 9.3*  HCT 31.2* 29.8* 32.9* 32.1* 30.8* 29.7* 28.9*  MCV 75.7* 75.3* 75.3* 74.3* 75.7* 74.4* 75.1*  PLT 360 331 339 314 279  253 234     CBG: Recent Labs  Lab 01/25/21 1106 01/25/21 1549 01/25/21 2056 01/26/21 0003 01/26/21 0334  GLUCAP 128* 126* 125* 121* 144*    Recent Results (from the past 240 hour(s))  Culture, blood (routine x 2)     Status: None (Preliminary result)   Collection Time: 01/24/21 10:27 AM   Specimen: BLOOD  Result Value Ref Range Status   Specimen Description BLOOD SITE NOT SPECIFIED  Final   Special Requests   Final    BOTTLES DRAWN AEROBIC ONLY Blood Culture results may not be optimal due to an inadequate volume of blood received in culture bottles   Culture   Final    NO GROWTH 2 DAYS Performed at [facility] [address], [place], [place] [address]    Report Status PENDING  Incomplete  Culture, blood (routine x 2)     Status: None (Preliminary result)   Collection Time: 01/24/21 10:27 AM   Specimen: BLOOD  Result Value Ref Range Status   Specimen Description BLOOD SITE NOT SPECIFIED  Final   Special Requests   Final    BOTTLES DRAWN AEROBIC ONLY Blood Culture results may not be optimal due to an inadequate volume of blood received in culture bottles   Culture   Final    NO GROWTH 2 DAYS Performed at [facility], [place] [address] [place], [place] [address]    Report Status PENDING  Incomplete  Culture, Respiratory w Gram Stain     Status: None (Preliminary result)   Collection Time: 01/24/21 10:54 AM   Specimen: Tracheal Aspirate  Result Value Ref Range Status   Specimen Description TRACHEAL ASPIRATE  Final   Special Requests NONE  Final   Gram Stain   Final    MODERATE WBC PRESENT,BOTH PMN AND MONONUCLEAR RARE GRAM POSITIVE COCCI RARE GRAM NEGATIVE RODS    Culture   Final    FEW SERRATIA MARCESCENS FEW KLEBSIELLA PNEUMONIAE CULTURE REINCUBATED FOR BETTER GROWTH SUSCEPTIBILITIES TO FOLLOW Performed at [facility], [place] [address] [place], [place] [address]    Report Status PENDING  Incomplete     Studies: No results  found.  Scheduled Meds:  bethanechol  25 mg Per Tube TID   carvedilol  25 mg Per Tube BID WC   chlorhexidine gluconate (MEDLINE KIT)  15 mL Mouth Rinse BID   Chlorhexidine Gluconate Cloth  6 each Topical Q0600   cloNIDine  0.1 mg Per Tube Q8H   feeding supplement (PROSource TF)  45 mL Per Tube Daily   fiber  1 packet Per Tube BID   free water  100 mL Per Tube Q4H   heparin injection (subcutaneous)  5,000 Units Subcutaneous Q8H   insulin aspart  0-20 Units Subcutaneous Q4H   insulin detemir  20 Units Subcutaneous BID   mouth rinse  15 mL Mouth Rinse 10 times per day   pantoprazole sodium  40 mg Per Tube Daily   Continuous Infusions:  ceFEPime (MAXIPIME) IV 2 g (01/26/21 0352)   feeding supplement (GLUCERNA 1.2 CAL) 1,000 mL (01/25/21 0208)    Principal Problem:   ICH (intracerebral hemorrhage) ([place]) Active Problems:   Malignant hypertension   Diabetes mellitus type 2 in obese ([place])   Acute respiratory failure ([place])   Tracheostomy dependence ([place])  Pneumonia due to Serratia marcescens ([facility])   MSSA (methicillin susceptible Staphylococcus aureus) pneumonia ([facility])   Stage 3b chronic kidney disease (CKD) ([place])   Acute kidney injury ([place])   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 ([facility])   Acute hypernatremia   Dysphagia due to recent stroke   Acute urinary retention   Respiration abnormal   Pressure injury of skin   Consultants: Neurology PCCM Medicine  Procedures: Echocardiogram EEG Cortrack PEG tube placement Tracheostomy tube placement  Antibiotics: Zosyn 12/15 through 12/21   Time spent: 45 minutes    Erin Hearing ANP  [facility] 7 am - 330 pm/M-F for direct patient care and secure chat Please refer to Amion for contact info 27  days

## 2021-01-26 NOTE — Consult Note (Addendum)
Cardiology Consult    Patient ID: FODAY CONE MRN: [id number], DOB/AGE: 1965/04/04   Admit date: 12/30/2020 Date of Consult: 01/26/2021  Primary Physician: Patient, [name] Primary Cardiologist: None - prev seen by Lendell Caprice, MD  Requesting Provider: Raliegh Ip. Starla Link, MD  Patient Profile    Edward Guthmiller Nakayama is a 55 y.o. male with a history of HTN, poorly controlled DM, and CKDIII, who was admitted 11/29 w/ headache, hypertensive urgency, large intraventricular hemorrhage/intracranial hemorrhage, small bilateral acute cerebral infarcts, and resp failure req intubation and subsequent trach who is being seen today for the evaluation of abnormal ECG  at the request of Dr. Starla Link.  Past Medical History   Past Medical History:  Diagnosis Date   Acute respiratory failure ([place])    a. 12/2020 in setting of IVH/strokes-->s/p trach.   Allergy    Bilateral Embolic strokes ([place])    a. 01/2021 MRI: numerous sm acute infarcts in bilat cerebral hemispheres, Lt caudate hemorrhage, IVH, scattered sulcal SAH, mild communicating hydrocephalus.   CKD (chronic kidney disease), stage III (HCC)    Diastolic dysfunction    a. 04/2015 Echo: EF 50-55%, mod LVH, no rwma, GrI DD, triv AI, + ASD; b. 12/2020 Echo: EF 50-55%, no rwma, grIDD, nl RV fxn, triv MR, mild AI, Asc Ao 102m.   Hypertension    Intraventricular hemorrhage ([place]    a. 12/2020 Aucte L basal ganglia intraparenchymal hemorrhage w/ IVH in the setting of hypertensive emergency.   Type II diabetes mellitus ([facility]     Past Surgical History:  Procedure Laterality Date   ESOPHAGOGASTRODUODENOSCOPY N/A 01/16/2021   Procedure: ESOPHAGOGASTRODUODENOSCOPY (EGD);  Surgeon: TGeorganna Skeans MD;  [facility]  Service: Endoscopy;  Laterality: N/A;   NO PAST SURGERIES     PEG PLACEMENT N/A 01/16/2021   Procedure: PERCUTANEOUS ENDOSCOPIC GASTROSTOMY (PEG) PLACEMENT;  Surgeon: TGeorganna Skeans MD;  [facility]  Service:  Endoscopy;  Laterality: N/A;     Allergies  No Known Allergies  History of Present Illness    55y.o. male with a history of HTN, poorly controlled DM, and CKDIII.  Pt is unresponsive and unable to provide any history.  All history obtained from the chart.  No known prior cardiac history.  He presented to the ED on 11/29 w/ c/o headache, n, v, and altered mental status w/ agitation.  [facility] L caudate/basal ganglia hemorrhage w/ intraventricular hemorrhage.  He was transferred to neuro ICU @ [facility].  ECG on admission showed sinus tachycardia with anterior ST segment elevation and inferolateral ST depression and T wave inversion.  HsTrop 45  63 on 11/29.  An echocardiogram on November 29 showed an EF of 50 to 55% with no regional wall motion abnormalities, grade 1 diastolic dysfunction, normal RV function, trivial MR, and a trivial circumferential pericardial effusion.  He developed resp failure and required intubation on 12/3.  MRI brain 12/3 showed small acute infarcts in bilateral cerebral hemispheres, left caudate hemorrhage, intraventricular hemorrhage, scattered sulcal subarachnoid hemorrhage, and mild communicating hydrocephalus.  He has req abx for asp pna.  He underwent trach on 12/13 and transitioned to trach collar on 12/15.  He was tx to the floor on 12/19.  On the evening of 12/25, nsg staff was alerted to ST elevated noted on telemetry.  F/u ECG again showed anterior ST elevation w/ inferolateral ST depression and TWI.  HsTroponins have been ordered and are pending.  Pt  is unresponsive except to pain.  Inpatient Medications     bethanechol  25 mg Per Tube TID   carvedilol  25 mg Per Tube BID WC   chlorhexidine gluconate (MEDLINE KIT)  15 mL Mouth Rinse BID   Chlorhexidine Gluconate Cloth  6 each Topical Q0600   cloNIDine  0.1 mg Per Tube Q8H   feeding supplement (PROSource TF)  45 mL Per Tube Daily   fiber  1 packet Per Tube BID   free  water  100 mL Per Tube Q4H   heparin injection (subcutaneous)  5,000 Units Subcutaneous Q8H   insulin aspart  0-20 Units Subcutaneous Q4H   insulin detemir  20 Units Subcutaneous BID   mouth rinse  15 mL Mouth Rinse 10 times per day   pantoprazole sodium  40 mg Per Tube Daily    Family History - obtained from [facility] - pt unresponsive and unable to provide    Family History  Problem Relation Age of Onset   Stroke Paternal Uncle    He indicated that his mother is alive. He indicated that his father is deceased. He indicated that the status of his paternal uncle is unknown.   Social History - obtained from [facility] - pt unresponsive and unable to provide    Social History   Socioeconomic History   Marital status: Married    Spouse name: Not on file   Number of children: Not on file   Years of education: 16   Highest education level: Not on file  Occupational History   Not on file  Tobacco Use   Smoking status: Never   Smokeless tobacco: Never  Substance and Sexual Activity   Alcohol use: Yes    Alcohol/week: 0.0 standard drinks    Comment: Rarely.   Drug use: No   Sexual activity: Not on file  Other Topics Concern   Not on file  Social History Narrative   Not on file   Social Determinants of Health   Financial Resource Strain: Not on file  Food Insecurity: Not on file  Transportation Needs: Not on file  Physical Activity: Not on file  Stress: Not on file  Social Connections: Not on file  Intimate Partner Violence: Not on file     Review of Systems - pt is unresponsive and unable to provide any history at this time.    --------  Physical Exam    Blood pressure (!) 157/107, pulse 96, temperature 99.8 F (37.7 C), temperature source Axillary, resp. rate (!) 21, height 6' (1.829 m), weight 89.4 kg, SpO2 100 %.  General: Unresponsive, mild diffuse edema. Psych: Unresponsive Neuro: Unresponsive HEENT: Eyes closed.  No gross deformity.  Neck: Supple without bruits or  JVD. Lungs:  Resp regular and unlabored, diminished breath sounds bilat. Heart: RRR no s3, s4, or murmurs. Abdomen: Soft, non-tender, non-distended, BS + x 4.  Extremities: No clubbing, cyanosis.  Trace to 1+ bilat upper and lower ext edema. DP/PT2+, Radials 2+ and equal bilaterally.  Labs    Cardiac Enzymes Recent Labs  Lab 12/30/20 1730 12/30/20 2250 01/08/21 1833  TROPONINIHS 45* 63* 95*      Lab Results  Component Value Date   WBC 11.4 (H) 01/26/2021   HGB 9.3 (L) 01/26/2021   HCT 28.9 (L) 01/26/2021   MCV 75.1 (L) 01/26/2021   PLT 234 01/26/2021    Recent Labs  Lab 01/26/21 0107  NA 140  K 4.0  CL 112*  CO2 19*  BUN 36*  CREATININE 1.34*  CALCIUM 8.8*  GLUCOSE 125*   Lab Results  Component Value Date   CHOL 175 12/31/2020   HDL 45 12/31/2020   LDLCALC UNABLE TO CALCULATE IF TRIGLYCERIDE OVER 400 mg/dL 12/31/2020   TRIG 113 01/15/2021   Lab Results  Component Value Date   DDIMER 2.13 (H) 01/08/2021     Radiology Studies    CT ABDOMEN PELVIS WO CONTRAST  Result Date: 01/23/2021 CLINICAL DATA:  Peg tube placement EXAM: CT ABDOMEN AND PELVIS WITHOUT CONTRAST TECHNIQUE: Multidetector CT imaging of the abdomen and pelvis was performed following the standard protocol without IV contrast. COMPARISON:  None. FINDINGS: Lower chest: Patchy airspace consolidations within the dependent portions of the bilateral lower lobes, right worse than left. Heart size within normal limits. Hepatobiliary: Unremarkable unenhanced appearance of the liver. No focal liver lesion identified. Gallbladder within normal limits. No hyperdense gallstone. No biliary dilatation. Pancreas: Unremarkable. No pancreatic ductal dilatation or surrounding inflammatory changes. Spleen: Normal in size without focal abnormality. Adrenals/Urinary Tract: Adrenal glands are unremarkable. Kidneys are normal, without renal calculi, focal lesion, or hydronephrosis. Urinary bladder decompressed by Foley  catheter. Stomach/Bowel: Percutaneous gastrostomy tube is appropriately positioned within the gastric body. No fluid collections along the tube tract. Stomach is otherwise within normal limits. No dilated loops of bowel. No focal bowel wall thickening or inflammatory changes. Vascular/Lymphatic: No significant vascular findings are present. No enlarged abdominal or pelvic lymph nodes. Reproductive: Prostate is unremarkable. Other: Trace free fluid within the pelvis. No organized abdominopelvic fluid collection. No pneumoperitoneum. No abdominal wall hernia. Mild anasarca. Musculoskeletal: No acute or significant osseous findings. Scoliotic thoracolumbar curvature with lower lumbar facet arthropathy. IMPRESSION: 1. Percutaneous gastrostomy tube is appropriately positioned within the gastric body without evidence of complication. 2. Patchy airspace consolidations within the dependent portions of the bilateral lower lobes, right worse than left, suspicious for aspiration and/or pneumonia. 3. Trace free fluid within the pelvis. 4. Mild anasarca. Electronically Signed   By: Davina Poke D.O.   On: 01/23/2021 15:40   DG Chest 1 View  Result Date: 12/31/2020 CLINICAL DATA:  Cerebral hemorrhage. EXAM: CHEST  1 VIEW COMPARISON:  PA and lateral chest 04/19/2015. FINDINGS: There is mild-to-moderate cardiomegaly without evidence of CHF. The mediastinum is normally outlined. Both lungs are clear. The visualized skeletal structures are unremarkable. IMPRESSION: No evidence of acute chest disease or interval changes. Stable chest with cardiomegaly. Electronically Signed   By: Telford Nab M.D.   On: 12/31/2020 03:58   CT HEAD WO CONTRAST (5MM)  Result Date: 01/23/2021 CLINICAL DATA:  Altered mental status.  Stroke. EXAM: CT HEAD WITHOUT CONTRAST TECHNIQUE: Contiguous axial images were obtained from the base of the skull through the vertex without intravenous contrast. COMPARISON:  MRI brain dated January 16, 2021.  CT head dated January 08, 2021. FINDINGS: Brain: Expected evolution of the bilateral cerebral white matter and cerebellar infarcts, as well as the left caudate infarct. Resolved left caudate intraparenchymal hemorrhage. Decreased intraventricular hemorrhage with trace residual layering hemorrhage in the left occipital horn. No midline shift or herniation. No extra-axial collection or mass lesion. Vascular: No hyperdense vessel or unexpected calcification. Skull: Normal. Negative for fracture or focal lesion. Sinuses/Orbits: Decreasing paranasal sinus fluid. Unchanged trace fluid in the left mastoid air cells. Unchanged left maxillary sinus mucous retention cyst. The orbits are unremarkable. Other: None. IMPRESSION: 1. Expected evolution of the bilateral cerebral white matter and cerebellar infarcts, as well as the left caudate infarct. Resolved left caudate intraparenchymal hemorrhage. 2.  Decreased intraventricular hemorrhage with trace residual layering hemorrhage in the left occipital horn. 3. No midline shift or herniation. Electronically Signed   By: Titus Dubin M.D.   On: 01/23/2021 15:43   CT HEAD WO CONTRAST (5MM)  Result Date: 01/08/2021 CLINICAL DATA:  Follow-up examination for acute stroke, intracranial hemorrhage. EXAM: CT HEAD WITHOUT CONTRAST TECHNIQUE: Contiguous axial images were obtained from the base of the skull through the vertex without intravenous contrast. COMPARISON:  Prior CT from 01/06/2021. FINDINGS: Brain: Previously identified intraparenchymal hemorrhage involving the left caudate is decreased in size now measuring up to 12 mm. Associated intraventricular extension with blood in the lateral ventricles is also decreased. Associated ventricular size is not significantly changed or worsened without progressive hydrocephalus or trapping. No new intracranial hemorrhage or large vessel territory infarct. Underlying atrophy with chronic microvascular ischemic disease noted. No mass  lesion or extra-axial fluid collection. No midline shift. Vascular: No hyperdense vessel. Skull: Scalp soft tissues and calvarium demonstrate no new finding. Sinuses/Orbits: Globes orbital soft tissues demonstrate no acute finding. Nasogastric and enteric tubes in place. Scattered mucosal thickening noted within the sphenoid ethmoidal and maxillary sinuses. Small bilateral mastoid effusions. Other: None. IMPRESSION: 1. Interval decrease in size of intraparenchymal hemorrhage involving the left caudate, with interval decrease in associated intraventricular hemorrhage as well. Ventricular size is not significantly changed or worsened with no evidence of progressive hydrocephalus or trapping. 2. No other new acute intracranial abnormality. Electronically Signed   By: Jeannine Boga M.D.   On: 01/08/2021 20:59   CT HEAD WO CONTRAST (5MM)  Result Date: 01/06/2021 CLINICAL DATA:  Hemorrhage follow-up EXAM: CT HEAD WITHOUT CONTRAST TECHNIQUE: Contiguous axial images were obtained from the base of the skull through the vertex without intravenous contrast. COMPARISON:  None. FINDINGS: Brain: Intraventricular hemorrhage is unchanged. Intraparenchymal component in the left caudate head is also unchanged. No new site of hemorrhage. No mass effect. There is periventricular hypoattenuation compatible with chronic microvascular disease. Vascular: No abnormal hyperdensity of the major intracranial arteries or dural venous sinuses. No intracranial atherosclerosis. Skull: The visualized skull base, calvarium and extracranial soft tissues are normal. Sinuses/Orbits: Moderate paranasal sinus mucosal thickening. The orbits are normal. IMPRESSION: Unchanged intraventricular hemorrhage and left caudate head intraparenchymal component. No new site of hemorrhage. Electronically Signed   By: Ulyses Jarred M.D.   On: 01/06/2021 20:58   CT HEAD WO CONTRAST (5MM)  Result Date: 01/03/2021 CLINICAL DATA:  Stroke follow-up EXAM: CT  HEAD WITHOUT CONTRAST TECHNIQUE: Contiguous axial images were obtained from the base of the skull through the vertex without intravenous contrast. COMPARISON:  01/01/2021 FINDINGS: Brain: Unchanged bilateral intraventricular hemorrhage. Previously seen subarachnoid hemorrhage has nearly completely resolved. The intraparenchymal hematoma in the left caudate head is unchanged. There is periventricular hypoattenuation compatible with chronic microvascular disease. Size and configuration of the ventricles are unchanged. Vascular: No abnormal hyperdensity of the major intracranial arteries or dural venous sinuses. No intracranial atherosclerosis. Skull: The visualized skull base, calvarium and extracranial soft tissues are normal. Sinuses/Orbits: No fluid levels or advanced mucosal thickening of the visualized paranasal sinuses. No mastoid or middle ear effusion. The orbits are normal. IMPRESSION: 1. Unchanged bilateral intraventricular hemorrhage and left caudate head intraparenchymal hematoma. 2. Near complete resolution of previously seen subarachnoid hemorrhage. 3. Unchanged size and configuration of the ventricles. Electronically Signed   By: Ulyses Jarred M.D.   On: 01/03/2021 21:08   CT HEAD WO CONTRAST (5MM)  Result Date: 01/01/2021 CLINICAL DATA:  Intracranial hemorrhage follow up  EXAM: CT HEAD WITHOUT CONTRAST TECHNIQUE: Contiguous axial images were obtained from the base of the skull through the vertex without intravenous contrast. COMPARISON:  12/31/2020 FINDINGS: Brain: Unchanged left caudate intraparenchymal hemorrhage with intraventricular extension and moderate communicating hydrocephalus. Subarachnoid blood over both hemispheres is unchanged allowing for redistribution. No midline shift or other mass effect. Vascular: No abnormal hyperdensity of the major intracranial arteries or dural venous sinuses. No intracranial atherosclerosis. Skull: The visualized skull base, calvarium and extracranial soft  tissues are normal. Sinuses/Orbits: No fluid levels or advanced mucosal thickening of the visualized paranasal sinuses. No mastoid or middle ear effusion. The orbits are normal. IMPRESSION: 1. Unchanged left caudate intraparenchymal hemorrhage with intraventricular extension and moderate communicating hydrocephalus. 2. Unchanged subarachnoid blood over both hemispheres. Electronically Signed   By: Ulyses Jarred M.D.   On: 01/01/2021 01:24   CT HEAD WO CONTRAST (5MM)  Result Date: 12/31/2020 CLINICAL DATA:  Intracranial hemorrhage, follow-up EXAM: CT HEAD WITHOUT CONTRAST TECHNIQUE: Contiguous axial images were obtained from the base of the skull through the vertex without intravenous contrast. COMPARISON:  Earlier same day FINDINGS: Brain: As before, there is hemorrhage involving the left caudate with significant intraventricular extension. Appearance is similar apart from some interval redistribution. There is no new hemorrhage. Remain similar enlargement of the ventricles. No new loss of gray-white differentiation. Stable findings of probable chronic microvascular ischemic changes in the cerebral white matter. Vascular: No new finding. Skull: Calvarium is unremarkable. Sinuses/Orbits: No acute finding. Other: None. IMPRESSION: Similar left caudate hemorrhage with significant intraventricular extension. No new hemorrhage or worsening mass effect. Stable probable mild hydrocephalus. Electronically Signed   By: Macy Mis M.D.   On: 12/31/2020 16:36   CT HEAD WO CONTRAST (5MM)  Result Date: 12/31/2020 CLINICAL DATA:  Stroke follow-up EXAM: CT HEAD WITHOUT CONTRAST TECHNIQUE: Contiguous axial images were obtained from the base of the skull through the vertex without intravenous contrast. COMPARISON:  12/30/2020 FINDINGS: Brain: Unchanged distribution of intraventricular hemorrhage with unchanged mild communicating hydrocephalus. Intraparenchymal hemorrhage component in the left caudate head is unchanged.  There is periventricular hypoattenuation compatible with chronic microvascular disease. Vascular: No abnormal hyperdensity of the major intracranial arteries or dural venous sinuses. No intracranial atherosclerosis. Skull: The visualized skull base, calvarium and extracranial soft tissues are normal. Sinuses/Orbits: No fluid levels or advanced mucosal thickening of the visualized paranasal sinuses. No mastoid or middle ear effusion. The orbits are normal. IMPRESSION: 1. Unchanged distribution of intraventricular hemorrhage with unchanged mild communicating hydrocephalus. 2. Unchanged left caudate head intraparenchymal hemorrhage. Electronically Signed   By: Ulyses Jarred M.D.   On: 12/31/2020 01:21   CT Head Wo Contrast  Result Date: 12/30/2020 CLINICAL DATA:  Headache.  Concern for intracranial hemorrhage. EXAM: CT HEAD WITHOUT CONTRAST TECHNIQUE: Contiguous axial images were obtained from the base of the skull through the vertex without intravenous contrast. COMPARISON:  None. FINDINGS: Brain: There is a large amount of intraventricular hemorrhage involving the lateral, third, and fourth ventricle. There is intraparenchymal hemorrhage within the left basal ganglia. No significant hydrocephalus. There is moderate periventricular and deep white matter chronic microvascular ischemic changes. No significant mass effect or midline shift. Vascular: No hyperdense vessel or unexpected calcification. Skull: Normal. Negative for fracture or focal lesion. Sinuses/Orbits: Partially visualized 2 cm left maxillary sinus retention cyst or polyp. The remainder of the visualized paranasal sinuses and mastoid air cells are clear. Other: None IMPRESSION: 1. Left basal ganglia intraparenchymal hemorrhage with extension of a large amount of blood into the  ventricular system. No significant mass effect or midline shift. 2. Moderate chronic microvascular ischemic changes. These results were called by telephone at the time of  interpretation on 12/30/2020 at 5:45 pm to Dr. Eula Fried , who verbally acknowledged these results. Electronically Signed   By: Anner Crete M.D.   On: 12/30/2020 17:49   MR ANGIO HEAD WO CONTRAST  Result Date: 01/04/2021 CLINICAL DATA:  56 year old male with acute intracranial hemorrhage, numerous scattered small acute infarcts in both cerebral hemispheres. EXAM: MRA HEAD WITHOUT CONTRAST TECHNIQUE: Angiographic images of the Circle of Willis were acquired using MRA technique without intravenous contrast. COMPARISON:  Neck MRA today.  Brain MRI 01/03/2021. FINDINGS: Posterior circulation: Antegrade flow in the posterior circulation with codominant distal vertebral arteries. No distal vertebral or basilar stenosis. Faintly visible patent PICA. Patent AICA origins, SCA and PCA origins. Mildly tortuous left P1 segment. Small posterior communicating arteries. Bilateral PCA branches are otherwise within normal limits. Anterior circulation: Antegrade flow in both ICA siphons. Ophthalmic and posterior communicating artery origins appear normal. No siphon stenosis. Normal MCA and ACA origins. Diminutive anterior communicating artery. Visible bilateral ACA branches are within normal limits. MCA M1 segments, bifurcations, and visible bilateral MCA branches are within normal limits. Anatomic variants: None. Other: Intraventricular hemorrhage. Ventricle size and configuration stable since yesterday. No midline shift. Basilar cisterns appear patent. Scattered parenchymal susceptibility in keeping with intra-axial blood products. IMPRESSION: 1.  Negative intracranial MRA. 2. Intracranial hemorrhage and ventricles appears stable from the MRI yesterday. Electronically Signed   By: Genevie Ann M.D.   On: 01/04/2021 10:24   MR ANGIO NECK WO CONTRAST  Result Date: 01/04/2021 CLINICAL DATA:  55 year old male with acute intracranial hemorrhage, numerous scattered small acute infarcts in both cerebral hemispheres. EXAM: MRA NECK  WITHOUT CONTRAST TECHNIQUE: Angiographic images of the neck were acquired using MRA technique without intravenous contrast. Carotid stenosis measurements (when applicable) are obtained utilizing NASCET criteria, using the distal internal carotid diameter as the denominator. COMPARISON:  Brain MRI yesterday. FINDINGS: Time-of-flight neck MRA imaging. Four vessel arch configuration suspected with probable separate origin of the left vertebral artery from the arch. Tortuous brachiocephalic artery and proximal right CCA. Antegrade flow in the right carotid with patent right carotid bifurcation and cervical right ICA with no evidence of hemodynamically significant stenosis. Similar tortuosity of the left CCA. No evidence of hemodynamically significant left carotid stenosis. Proximal subclavian and vertebral artery origins are patent. Antegrade flow in could dominant vertebral arteries to the skull base without evidence of significant stenosis. Mild vertebral artery tortuosity. Four vessel antegrade flow continues to the skull base. IMPRESSION: Mildly tortuous cervical carotid and vertebral arteries without evidence of stenosis. Electronically Signed   By: Genevie Ann M.D.   On: 01/04/2021 10:21   MR BRAIN WO CONTRAST  Result Date: 01/16/2021 CLINICAL DATA:  Stroke suspected patient not waking after being off sedation for several days, follow-up EXAM: MRI HEAD WITHOUT CONTRAST TECHNIQUE: Multiplanar, multiecho pulse sequences of the brain and surrounding structures were obtained without intravenous contrast. COMPARISON:  01/04/2019. FINDINGS: Brain: Multifocal restricted diffusion, throughout the bilateral cerebral hemispheres, which have increased in size and number compared to 01/03/2021. These are primarily in the white matter. The size discrepancy is most prominent adjacent to body of the lateral ventricles, where a large confluent area of abnormal signal in the left periventricular white matter measures up to 5.0 x  1.6 cm in the axial plane (series 2, image 36), previously multiple small foci, which were not confluent.  Numerous foci are also now seen in the bilateral cerebellar hemispheres (series 2, image 13). These areas of diffusion restriction demonstrate ADC and are felt to be acute. Redemonstrated intraventricular hemorrhage, now primarily in the frontal horns and layering in the occipital horns. Overall unchanged size and configuration of the ventricles. Redemonstrated parenchymal hemorrhage involving the left caudate head. Additional foci of susceptibility in the cerebellum, pons, medulla right putamen, and subcortical white matter, without definite new focus of hemorrhage. No significant mass effect or midline shift. Vascular: Normal flow voids. Skull and upper cervical spine: Normal marrow signal. Sinuses/Orbits: Mucous retention cyst in the left maxillary sinus. Mucosal thickening in the bilateral sphenoid sinuses and ethmoid air cells. Layering fluid in the right maxillary sinus. The orbits are unremarkable. Other: Fluid in the bilateral mastoid air cells. IMPRESSION: 1. Interval increase in the size and number of acute infarcts in the bilateral cerebral hemispheres, now with increased involvement in the cerebellum bilaterally. 2. Redemonstrated intraventricular hemorrhage, which is now primarily in the frontal horns and layering in the occipital horns. Overall unchanged size and configuration of the ventricles. 3. Redemonstrated parenchymal hemorrhage involving the left caudate head, with additional foci of hemosiderin deposition bilaterally. No definite new foci of intraparenchymal hemorrhage. These results will be called to the ordering clinician or representative by the Radiologist Assistant, and communication documented in the PACS or [employer]. Electronically Signed   By: Merilyn Baba M.D.   On: 01/16/2021 21:34   MR BRAIN WO CONTRAST  Result Date: 01/03/2021 CLINICAL DATA:  Neuro deficit, acute,  stroke suspected; intracranial hemorrhage EXAM: MRI HEAD WITHOUT CONTRAST TECHNIQUE: Multiplanar, multiecho pulse sequences of the brain and surrounding structures were obtained without intravenous contrast. COMPARISON:  Correlation made with recent CT imaging FINDINGS: Brain: As seen on recent CT imaging, there is hemorrhage within the ventricular system and parenchymal hemorrhage involving the inferior left caudate head. There is also T2 FLAIR hyperintensity in scattered cortical sulci reflecting subarachnoid hemorrhage also seen on CT. Resulting communicating hydrocephalus is present. There are additionally scattered foci of susceptibility involving the cerebellum, brainstem, right putamen, and subcortical white matter. These likely represent chronic microhemorrhages other than an inferior left frontal focus defect corresponds to mineralization on CT. Numerous small foci of restricted diffusion are present in bilateral cerebral hemispheres. There is minimal involvement of the superior right cerebellum as well. Patchy and confluent areas of T2 hyperintensity in the supratentorial white matter are nonspecific but probably reflect combination of chronic microvascular ischemic changes as well as interstitial edema related to hydrocephalus. There is no mass lesion or mass effect. Vascular: Major vessel flow voids at the skull base are preserved. Skull and upper cervical spine: Normal marrow signal is preserved. Sinuses/Orbits: Minor mucosal thickening.  Orbits are unremarkable. Other: Sella is unremarkable.  Mastoid air cells are clear. IMPRESSION: Numerous small acute infarcts in bilateral cerebral hemispheres. Minimal involvement of the right superior cerebellum. Left caudate hemorrhage, intraventricular hemorrhage, and scattered sulcal subarachnoid hemorrhage as seen on recent CT imaging. Associated mild communicating hydrocephalus. Numerous chronic microhemorrhages probably secondary to hypertension. Chronic  microvascular ischemic changes. Electronically Signed   By: Macy Mis M.D.   On: 01/03/2021 15:33   DG CHEST PORT 1 VIEW  Result Date: 01/14/2021 CLINICAL DATA:  History of fever EXAM: PORTABLE CHEST 1 VIEW COMPARISON:  01/13/2021, 01/11/2021 FINDINGS: Tracheostomy tube tip about 4 cm superior to carina. Esophageal tube tip below the diaphragm but incompletely visualized. Airspace disease at left base. Stable cardiomediastinal silhouette. No pneumothorax. IMPRESSION:  Airspace disease at the left lung base which may be due to atelectasis, pneumonia, or aspiration. Electronically Signed   By: Donavan Foil M.D.   On: 01/14/2021 19:56   DG CHEST PORT 1 VIEW  Result Date: 01/13/2021 CLINICAL DATA:  Tracheostomy placement EXAM: PORTABLE CHEST 1 VIEW COMPARISON:  Chest x-ray dated January 11, 2021 FINDINGS: Tracheostomy tube place. Feeding tube partially visualized coursing below the diaphragm. Cardiac and mediastinal contours are unchanged when accounting for patient rotation. Elevation of the right hemidiaphragm similar to prior. No large pleural effusion or pneumothorax. Mild bibasilar opacities, likely due to atelectasis. IMPRESSION: Interval removal of ET tube and placement of tracheostomy tube. Electronically Signed   By: Yetta Glassman M.D.   On: 01/13/2021 13:27   DG Chest Port 1 View  Result Date: 01/11/2021 CLINICAL DATA:  55 year old male currently intubated EXAM: PORTABLE CHEST 1 VIEW COMPARISON:  Prior chest x-ray 01/08/2021 FINDINGS: Tip of the endotracheal tube is 4.6 cm above the carina. A feeding tube is present, the tip is not visible and lies off the field of view, presumably within the stomach or small intestine. Cardiac and mediastinal contours are within normal limits. Perhaps mild right lower lobe atelectasis. Otherwise, the lungs are clear. No acute osseous abnormality. IMPRESSION: Improved aeration with decreased pulmonary vascular congestion and improving bibasilar  atelectasis. Stable and satisfactory support apparatus. Electronically Signed   By: Jacqulynn Cadet M.D.   On: 01/11/2021 07:55   DG CHEST PORT 1 VIEW  Result Date: 01/08/2021 CLINICAL DATA:  AMS, SOB EXAM: PORTABLE CHEST - 1 VIEW COMPARISON:  01/05/2021 FINDINGS: Endotracheal tube has been repositioned, tip approximately 5 cm above carina. Feeding tube in place, tip not seen. Relatively low lung volumes. Lungs are clear. Heart size upper limits normal. No effusion. Visualized bones unremarkable. IMPRESSION: Support hardware in good position.  No acute findings. Electronically Signed   By: Lucrezia Europe M.D.   On: 01/08/2021 18:16   DG Chest Port 1 View  Result Date: 01/05/2021 CLINICAL DATA:  Respiratory failure EXAM: PORTABLE CHEST 1 VIEW COMPARISON:  Chest radiograph 01/04/2021 FINDINGS: The enteric catheter tip is approximately 3.1 cm from the carina. The enteric catheter tip is off the field of view. The cardiomediastinal silhouette is stable. Aeration of the lungs is not significantly changed. There is no new or worsening focal airspace disease. There is no significant pleural effusion. There is no pneumothorax. The bones are stable. IMPRESSION: No significant interval change in lung aeration since 01/04/2021. No new or worsening focal airspace disease. Electronically Signed   By: Valetta Mole M.D.   On: 01/05/2021 08:48   DG CHEST PORT 1 VIEW  Result Date: 01/04/2021 CLINICAL DATA:  Respiratory failure in a 55 year old male. EXAM: PORTABLE CHEST 1 VIEW COMPARISON:  January 04, 2021. FINDINGS: Endotracheal tube terminates approximately 3.2 cm from the carina, tip between clavicular heads, perhaps advanced slightly since previous imaging. Feeding tube courses through in off the field of the radiograph into the upper abdomen. EKG leads project over the chest. No sign of lobar consolidative process. Developing airspace disease may be present in the LEFT lung base, mild patchy airspace disease.  Cardiomediastinal contours are stable. No acute skeletal process on limited assessment. IMPRESSION: Endotracheal tube between clavicular heads. Potentially advanced slightly or appearance secondary to position, remains approximately 3.2 cm above the carina. Atelectasis versus developing infection since previous chest x-ray. Lung volumes are slightly diminished. Attention on follow-up. Electronically Signed   By: Jewel Baize.D.  On: 01/04/2021 13:35   DG Chest Port 1 View  Result Date: 01/04/2021 CLINICAL DATA:  Respiratory failure EXAM: PORTABLE CHEST 1 VIEW COMPARISON:  Chest x-rays dated 01/03/2021. FINDINGS: 0404 hours. Endotracheal tube appears adequately positioned with tip approximately 5 cm above the carina. Enteric tube passes below the diaphragm. Heart size and mediastinal contours are stable. Lungs are clear. No pleural effusion or pneumothorax is seen. IMPRESSION: 1. Endotracheal tube appears adequately positioned with tip approximately 5 cm above the carina. 2. Lungs are clear. Electronically Signed   By: Franki Cabot M.D.   On: 01/04/2021 08:00   DG CHEST PORT 1 VIEW  Result Date: 01/03/2021 CLINICAL DATA:  Endotracheal tube EXAM: PORTABLE CHEST 1 VIEW COMPARISON:  Chest x-ray from earlier same day. FINDINGS: Endotracheal tube is adequately positioned with tip at the level of the clavicles and approximately 5 cm above the carina. Enteric tube passes below the diaphragm. Lungs are clear. No pleural effusion or pneumothorax is seen. Heart size and mediastinal contours are within normal limits. IMPRESSION: 1. Endotracheal tube adequately positioned with tip at the level of the clavicles and approximately 5 cm above the carina. 2. Enteric tube passes below the diaphragm. 3. Lungs are clear. Electronically Signed   By: Franki Cabot M.D.   On: 01/03/2021 15:41   DG CHEST PORT 1 VIEW  Result Date: 01/03/2021 CLINICAL DATA:  55 year old male intubated. EXAM: PORTABLE CHEST 1 VIEW COMPARISON:   Portable chest 01/01/2021. FINDINGS: Portable AP semi upright view at 0819 hours. Endotracheal tube tip in place at the level of the superior clavicles. Enteric tube courses to the abdomen as before. Tip not included. Larger, improved lung volumes. Normal cardiac size and mediastinal contours. Allowing for portable technique the lungs are clear. No pneumothorax or pleural effusion. Paucity of bowel gas in the upper abdomen. No acute osseous abnormality identified. IMPRESSION: 1. Endotracheal tube tip in place at the level of the superior clavicles. Advance 1 cm for more optimal placement. 2. No acute cardiopulmonary abnormality. Electronically Signed   By: Genevie Ann M.D.   On: 01/03/2021 08:48   DG CHEST PORT 1 VIEW  Result Date: 01/01/2021 CLINICAL DATA:  Respiratory failure. EXAM: PORTABLE CHEST 1 VIEW COMPARISON:  Chest x-ray 12/31/2020. FINDINGS: Enteric tube extends below the diaphragm with distal tip near the gastric antrum. The cardiomediastinal silhouette appears prominent in size similar to the prior study. There is no lung consolidation, pleural effusion or pneumothorax. No acute fractures are seen. IMPRESSION: 1. Cardiomegaly. 2. No evidence for pneumonia or edema. 3. Enteric tube tip projects over the gastric antrum. Electronically Signed   By: Ronney Asters M.D.   On: 01/01/2021 21:39   DG Abd Portable 1V  Result Date: 01/03/2021 CLINICAL DATA:  55 year old male intubated. EXAM: PORTABLE ABDOMEN - 1 VIEW COMPARISON:  12/31/2020. FINDINGS: Portable AP supine view at 0636 hours. Enteric tube courses through the stomach terminating just to the right of midline at either the gastric antrum or duodenal bulb. Stable gas-filled but nondilated large bowel loops. No dilated small bowel. Scoliosis. No acute osseous abnormality identified. IMPRESSION: Enteric tube tip at the gastric antrum versus duodenal bulb. Advance several centimeters to ensure post pyloric placement. Electronically Signed   By: Genevie Ann  M.D.   On: 01/03/2021 08:50   DG Abd Portable 1V  Result Date: 12/31/2020 CLINICAL DATA:  Feeding tube placement EXAM: PORTABLE ABDOMEN - 1 VIEW COMPARISON:  None. FINDINGS: Feeding tube enters the stomach with the tip the gastric antrum.  Distended colon compatible with ileus. IMPRESSION: Feeding tube in the gastric antrum Adynamic ileus. Electronically Signed   By: Franchot Gallo M.D.   On: 12/31/2020 19:04   EEG adult  Result Date: 01/01/2021 Lora Havens, MD     01/01/2021  4:31 PM Patient Name: ESTON HESLIN MRN: [id number] Epilepsy Attending: Lora Havens Referring Physician/Provider: Dr Rosalin Hawking Date: 01/01/2021 Duration: 19.32 mins Patient history: 56 year old male with ICH and IVH.  EEG to evaluate for seizure. Level of alertness:  lethargic AEDs during EEG study: None Technical aspects: This EEG study was done with scalp electrodes positioned according to the 10-20 International system of electrode placement. Electrical activity was acquired at a sampling rate of _0  and reviewed with a high frequency filter of _1  and a low frequency filter of _2 . EEG data were recorded continuously and digitally stored. Description: EEG showed continuous generalized 3 to 6 Hz theta-delta slowing.  Hyperventilation and photic stimulation were not performed.   Of note, EEG was technically difficult due to significant EKG artifact. ABNORMALITY - Continuous slow, generalized IMPRESSION: This technically difficult study is suggestive of moderate diffuse encephalopathy, nonspecific etiology. No seizures or epileptiform discharges were seen throughout the recording. Priyanka Barbra Sarks   VAS US CAROTID  Result Date: 01/02/2021 Carotid Arterial Duplex Study Patient Name:  KWAME RYLAND [facility]  Date of Exam:   01/01/2021 Medical Rec #: [id number]       Accession #:    [id number] Date of Birth: Aug 23, 1965      Patient Gender: M Patient Age:   46 years Exam Location:  [facility] Procedure:      VAS US CAROTID  Referring Phys: Cornelius Moras XU --------------------------------------------------------------------------------  Indications:       CVA. Risk Factors:      Hypertension, Diabetes. Limitations        Today's exam was limited due to the patient's inability or                    unwillingness to cooperate and the patient's respiratory                    variation. Comparison Study:  no prior Performing Technologist: Archie Patten RVS  Examination Guidelines: A complete evaluation includes B-mode imaging, spectral Doppler, color Doppler, and power Doppler as needed of all accessible portions of each vessel. Bilateral testing is considered an integral part of a complete examination. Limited examinations for reoccurring indications may be performed as noted.  Right Carotid Findings: +----------+--------+--------+--------+------------------+--------+             PSV cm/s EDV cm/s Stenosis Plaque Description Comments  +----------+--------+--------+--------+------------------+--------+  CCA Prox   93       15                heterogenous                 +----------+--------+--------+--------+------------------+--------+  CCA Distal 98       16                heterogenous                 +----------+--------+--------+--------+------------------+--------+  ICA Prox   58       12       1-39%    heterogenous                 +----------+--------+--------+--------+------------------+--------+  ICA Distal 61       9                                              +----------+--------+--------+--------+------------------+--------+  ECA        147      20                                             +----------+--------+--------+--------+------------------+--------+ +----------+--------+-------+--------+-------------------+             PSV cm/s EDV cms Describe Arm Pressure (mmHG)  +----------+--------+-------+--------+-------------------+  Subclavian 123                                             +----------+--------+-------+--------+-------------------+ +---------+--------+--+--------+--+---------+  Vertebral PSV cm/s 60 EDV cm/s 11 Antegrade  +---------+--------+--+--------+--+---------+  Left Carotid Findings: +----------+--------+--------+--------+------------------+--------------+             PSV cm/s EDV cm/s Stenosis Plaque Description Comments        +----------+--------+--------+--------+------------------+--------------+  CCA Prox   198      26                heterogenous                       +----------+--------+--------+--------+------------------+--------------+  CCA Distal 81       12                heterogenous                       +----------+--------+--------+--------+------------------+--------------+  ICA Prox   33       7        1-39%    heterogenous                       +----------+--------+--------+--------+------------------+--------------+  ICA Distal 39       10                                                   +----------+--------+--------+--------+------------------+--------------+  ECA                                                      Not visualized  +----------+--------+--------+--------+------------------+--------------+ +----------+--------+--------+--------+-------------------+             PSV cm/s EDV cm/s Describe Arm Pressure (mmHG)  +----------+--------+--------+--------+-------------------+  Subclavian 166                                             +----------+--------+--------+--------+-------------------+ +---------+--------+--------+--------------+  Vertebral PSV cm/s EDV cm/s Not identified  +---------+--------+--------+--------------+   Summary: Right Carotid: Velocities in the right ICA are consistent with a 1-39% stenosis. Left Carotid: Velocities in the left ICA are consistent with a 1-39% stenosis. Vertebrals: Right vertebral artery demonstrates antegrade flow. Left vertebral             artery was not visualized. *See table(s) above for measurements and  observations.  Electronically signed by Antony Contras MD on 01/02/2021 at  9:55:10 AM.    Final     ECG & Cardiac Imaging    12/8 sinus tachycardia, 101, ant ST elev w/ inflat ST dep and TWI - personally reviewed. 12/25 RSR, 84, ant ST elev w/ inflat ST dep and TWI - similar to both 12/8 and also 12/23 ECG - personally reviewed  Telemetry: RSR - personally reviewed  Assessment & Plan    1.  Abnormal ECG/elevated troponin:  Pt admitted 11/29 w/ h/a, n, v, AMS/agitation, HTN emergency, and finding of intraventricular hemorrhage.  Admission ECG w/ Ant ST elevation and inferolateral ST depression.  HsTrop on admission 45  63.  Echo 11/29 w/ nl LV fxn w/o rwma.  He had a f/u HsTrop on 12/8, which was only sl higher @ 95.  Prolonged hosp course complicated by resp failure req intubation  Trach, asp pna, malnutrition, persistent unresponsiveness.  Nsg staff alerted ST segment elevation noted on telemetry on the evening of December 25.  Follow-up ECG again showed anterior ST segment elevation with inferolateral ST depression T wave inversion.  Repeat troponin this morning mildly elevated at 42.  Patient remains unresponsive and hemodynamically stable.  ECG December 25 not significantly different from ECGs dated December 23 or November 29.  Suspect mild troponin elevation is nonspecific in the setting of intracranial hemorrhage and strokes, and does not represent acute coronary syndrome.  At this time, would not pursue any additional cardiac imaging unless a significant troponin elevation is noted on subsequent assay, at which point we will consider a repeat limited echocardiogram to evaluate LV function.  In the setting of recent intracranial hemorrhage and poor prognosis, he is not a candidate for any invasive evaluation, anticoagulation, or antiplatelet therapy.  Continue beta-blocker.  2.  Intraventricular hemorrhage/strokes: Per neurology.  3.  Hypertension: Antihypertensive management per neuro hospitalist  team in the setting of strokes and intracranial hemorrhage.  4.  Stage III chronic kidney disease: Creatinine has been stable.  Signed, Murray Hodgkins, NP 01/26/2021, 8:44 AM  For questions or updates, please contact   Please consult [email or web address] for contact info under Cardiology/STEMI.  As above, patient seen and examined.  Briefly he is a 55 year old male with past medical history of diabetes mellitus, hypertension, chronic stage III kidney disease admitted on November 29 with hypertensive urgency, intracranial hemorrhage ultimately quiring intubation followed by tracheostomy who we are asked to evaluate for abnormal electrocardiogram.  Note patient is unresponsive at the time of my evaluation.  Echocardiogram earlier this admission showed ejection fraction 50 to 32%, grade 1 diastolic dysfunction.  Last evening the patient was noted to have possible ST elevation on telemetry.  A follow-up electrocardiogram was ordered which showed sinus rhythm, left ventricular hypertrophy with strain which was similar to previous.  Cardiology asked to evaluate. Most recent head CT December 23 showed cerebellar and cerebral infarcts and decreasing size of intracranial hemorrhage.  Creatinine 1.34.  Hemoglobin 9.3.  Troponin 42 (note previous troponins were 95, 63 and 45).  Hemoglobin 9.3.  Electrocardiogram shows sinus rhythm, left ventricular hypertrophy with repolarization abnormality.  I do not think it is significantly changed compared to December 23. 1 abnormal electrocardiogram-I have personally reviewed the patient's ECG.  It demonstrates normal sinus rhythm, left ventricular hypertrophy with repolarization abnormality unchanged compared to previous.  The patient is unresponsive due to his recent intracranial hemorrhage/CVA and therefore cannot provide a history.  His LV function was normal on echocardiogram earlier this admission.  Troponin minimally elevated but unchanged compared to previous.  I do not  believe further cardiac evaluation is indicated.  Also note he would not be a candidate for cardiac catheterization given recent intracranial hemorrhage.  2 hypertension-blood pressure elevated.  Continue clonidine and carvedilol.  Could add amlodipine or hydralazine if needed.  3 status post intracranial hemorrhage/CVA-prognosis appears to be poor.  4 status post tracheostomy-followed by critical care medicine.  Cardiology will sign off.  Please call with questions.  Kirk Ruths, MD

## 2021-01-26 NOTE — Telephone Encounter (Signed)
° °  error   Nicolasa Ducking, NP 01/26/2021, 9:55 AM

## 2021-01-27 DIAGNOSIS — I6320 Cerebral infarction due to unspecified occlusion or stenosis of unspecified precerebral arteries: Secondary | ICD-10-CM | POA: Diagnosis not present

## 2021-01-27 DIAGNOSIS — I610 Nontraumatic intracerebral hemorrhage in hemisphere, subcortical: Secondary | ICD-10-CM | POA: Diagnosis not present

## 2021-01-27 DIAGNOSIS — I619 Nontraumatic intracerebral hemorrhage, unspecified: Secondary | ICD-10-CM | POA: Diagnosis not present

## 2021-01-27 DIAGNOSIS — R338 Other retention of urine: Secondary | ICD-10-CM | POA: Diagnosis not present

## 2021-01-27 LAB — GLUCOSE, CAPILLARY
Glucose-Capillary: 120 mg/dL — ABNORMAL HIGH (ref 70–99)
Glucose-Capillary: 123 mg/dL — ABNORMAL HIGH (ref 70–99)
Glucose-Capillary: 124 mg/dL — ABNORMAL HIGH (ref 70–99)
Glucose-Capillary: 140 mg/dL — ABNORMAL HIGH (ref 70–99)
Glucose-Capillary: 141 mg/dL — ABNORMAL HIGH (ref 70–99)
Glucose-Capillary: 144 mg/dL — ABNORMAL HIGH (ref 70–99)
Glucose-Capillary: 151 mg/dL — ABNORMAL HIGH (ref 70–99)
Glucose-Capillary: 156 mg/dL — ABNORMAL HIGH (ref 70–99)

## 2021-01-27 MED ORDER — SULFAMETHOXAZOLE-TRIMETHOPRIM 800-160 MG PO TABS
1.0000 | ORAL_TABLET | Freq: Two times a day (BID) | ORAL | Status: DC
  Administered 2021-01-27 – 2021-01-29 (×5): 1
  Filled 2021-01-27 (×5): qty 1

## 2021-01-27 NOTE — Progress Notes (Signed)
Nutrition Follow-up  DOCUMENTATION CODES:   Not applicable  INTERVENTION:  Continue TF via PEG: Glucerna 1.2 @ 70 ml/hr (1680 ml/day) ProSource TF 45 ml daily 158m free water  Q4H   Tube feeding regimen provides 2056 kcal, 111 grams of protein, and 1362 ml of H2O (19661mtotal free water).  NUTRITION DIAGNOSIS:   Inadequate oral intake related to lethargy/confusion, dysphagia as evidenced by NPO status.  ongoing  GOAL:   Patient will meet greater than or equal to 90% of their needs  Met with TF  MONITOR:   Diet advancement, Labs, Weight trends  REASON FOR ASSESSMENT:   Ventilator, Consult Enteral/tube feeding initiation and management  ASSESSMENT:   [age]ear old male who presented to the ED on 11/29 with AMS. PMH of HTN, T2DM, CKD stage III. 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  11/30 cortrak placed; tip gastric 12/2 trickle TF 12/3 pt intubated due to increased WOB, +emesis, TF held 12/4 trickle TF restarted 12/5 advancement orders placed 12/9 TF adjusted 12/13 s/p tracheostomy 12/16 s/p EGD and PEG placement 12[facility] developed Cheyne-Stokes respiratory pattern with respiratory alkalosis.  Transferred to ICU for short-term mechanical ventilation and was found to have new tracheobronchitis now on cefepime  Per MD, pt's hospital course has been complicated by significant neurological impairment, aspiration events, AKI, hypernatremia, urinary retention.  Pt unresponsive at time of RD visit. Tolerating TF via PEG --Glu 1.2 @ 70104mr w/ 27m43mosource daily and 100ml64me water Q4H. Discussed pt with RN who reports no issues with tolerance, but does report pt having hiccups. MD aware. Pt does not appear to be having tolerance issues 2/2 hiccups. Recommend continue current nutrition plan of care.   UOP: 2000ml 13mhours I/O: +17695ml s54m admit  Current weight: 89.4 kg Admit  weight: 85.2 kg   Edema: moderate pitting to BUE and RUE  Medications: Scheduled Meds:  bethanechol  25 mg Per Tube TID   carvedilol  25 mg Per Tube BID WC   chlorhexidine gluconate (MEDLINE KIT)  15 mL Mouth Rinse BID   Chlorhexidine Gluconate Cloth  6 each Topical Q0600   cloNIDine  0.1 mg Per Tube Q8H   feeding supplement (PROSource TF)  45 mL Per Tube Daily   fiber  1 packet Per Tube BID   free water  100 mL Per Tube Q4H   heparin injection (subcutaneous)  5,000 Units Subcutaneous Q8H   hydrALAZINE  25 mg Per Tube Q8H   insulin aspart  0-20 Units Subcutaneous Q4H   insulin detemir  20 Units Subcutaneous BID   mouth rinse  15 mL Mouth Rinse 10 times per day   pantoprazole sodium  40 mg Per Tube Daily   sulfamethoxazole-trimethoprim  1 tablet Per Tube Q12H  Continuous Infusions:  feeding supplement (GLUCERNA 1.2 CAL) 70 mL/hr at 01/27/21 0509   Labs: Recent Labs  Lab 01/24/21 1027 01/25/21 0836 01/26/21 0107  NA 140 139 140  K 4.3 4.1 4.0  CL 112* 111 112*  CO2 20* 20* 19*  BUN 37* 35* 36*  CREATININE 1.36* 1.28* 1.34*  CALCIUM 8.8* 8.8* 8.8*  GLUCOSE 141* 161* 125*  CBGs: 121-156 x24 hours  Diet Order:   Diet Order             Diet NPO time specified  Diet effective midnight  EDUCATION NEEDS:   Not appropriate for education at this time  Skin:  Skin Assessment: Skin Integrity Issues: Skin Integrity Issues:: Stage II Stage II: anus  Last BM:  12/26  Height:   Ht Readings from Last 1 Encounters:  01/23/21 6' (1.829 m)    Weight:   Wt Readings from Last 1 Encounters:  01/26/21 89.4 kg   BMI:  Body mass index is 26.73 kg/m.  Estimated Nutritional Needs:   Kcal:  2000-2200  Protein:  100-115 grams  Fluid:  >/= 2.0 L     Moira Umholtz A., MS, RD, LDN (she/her/hers) RD pager number and weekend/on-call pager number located in [place].

## 2021-01-27 NOTE — Progress Notes (Signed)
[facility] PROGRESS NOTE  Elmond Poehlman Larivee XBM:[id number] DOB: 10-21-1965 DOA: 12/30/2020 PCP: Patient, [name]  Status: Remains inpatient appropriate because:  Unsafe discharge plan.  Patient in persistent vegetative state per stroke needs placement at skilled nursing facility that is trach capable  Barriers to discharge: Social: Current insurance coverage about to expire and he otherwise has no funding for long-term care.  Clarifying regarding application for disability and Medicaid  Clinical: Tracheostomy and PEG dependent  Level of care:  Med-Surg but transferring back to ICU for mechanical ventilation and stat CT of head due to neurological change as of 12/23   Code Status: Full code Family Communication: Updated wife at bedside as to rationale why patient needed to transfer back to ICU and undergo a CT scan.  Made her aware that change in respiratory pattern could be indicative of worsening status that could lead to brain herniation and eventual brain death/body death DVT prophylaxis: SCDs COVID vaccination status: Unknown  Foley catheter: 42 French inserted on 12/17 POA: No  HPI: 55 y.o. male with PMH significant for DM2, HTN, CKD with questionable compliance to medications Patient presented to the ED on 12/30/2020 with complaint of headache, altered mental status   In the ED, he was hypertensive to 238/160, agitated, required restraints CT head showed large amount of intraventricular hemorrhage involving lateral/third/fourth ventricle, ICH and left basal ganglia as well as chronic microvascular ischemic changes. He was started on Cleviprex drip and admitted to neuro ICU   He subsequently had CT head repeated on 11/30, 12/1 and 12/3, all demonstrated unchanged intraparenchymal hemorrhage and intraventricular hemorrhage 12/3, MRI brain from also showed numerous small acute infarcts in both cerebral hemisphere with minimal improvement in the right  superior cerebellum 12/3, intubated 12/13, tracheostomy 12/15, wean from vent to trach collar 12/16, PEG tube placement 12/16, repeat MRI showed an increase in the size and number of acute infarcts in the bilateral hemispheres with increased involvement in the cerebellum bilaterally.   12/18, transferred from neuro service to [facility]  12/21, palliative care consulted.  Family wanted to continue aggressive care as full CODE STATUS. 12/23 developed Cheyne-Stokes respiratory pattern with respiratory alkalosis.  Transferred to ICU for short-term mechanical ventilation and was found to have new tracheobronchitis now on cefepime   His hospital course has been complicated by significant neurological impairment, aspiration events, AKI, hypernatremia, urinary retention.  Subjective: Unresponsive  Objective: Vitals:   01/27/21 0010 01/27/21 0501  BP: (!) 144/89 (!) 173/114  Pulse: 89 90  Resp: 20 20  Temp: 98.9 F (37.2 C) 98.9 F (37.2 C)  SpO2: 97% 100%    Intake/Output Summary (Last 24 hours) at 01/27/2021 0740 Last data filed at 01/27/2021 0509 Gross per 24 hour  Intake 2340 ml  Output 2000 ml  Net 340 ml   Filed Weights   01/23/21 0527 01/24/21 0359 01/26/21 0500  Weight: 89.3 kg 88.3 kg 89.4 kg    Exam:  Constitutional: Remains unresponsive Respiratory: #8.0 Shiley cuffed trach, tachypneic respiratory pattern similar to [place], no further apnea. Bilateral expiratory rhonchi which are less coarse than yesterday Cardiovascular: S1-S2, sinus tachycardia, normotensive, no peripheral edema Abdomen: no tenderness, no masses palpated.  Abdomen slightly distended but soft.  PEG tube for feedings bowel sounds positive. LBM 12/26-rectal Foley in place with brown drainage.   Neurologic: Unable to accurately test cranial nerves.  Eyes closed.  No blink reflex when tested.  Manually opened both eyes with eye positioning deviated slightly downward into the  right bilaterally.  Unresponsive  even to painful stimulus Psychiatric: Unresponsive   Assessment/Plan: Acute problems: Acute intraparenchymal and intraventricular hemorrhage Bilateral multifocal ischemic infarcts -Imaging and admission reviewed ischemic and hemorrhagic stroke likely from combination of uncontrolled hypertension and small vessel disease.  -no improvement in multiple repeat imagings.  Follow-up MRI on 12/16 demonstrated increase in size and number of acute infarcts  -Neurology has signed off as of 12/22 as they have nothing further to offer.  Currently on no blood thinners because of intracranial hemorrhage. -12/23 recent Cheyne-Stokes respiratory pattern combination of underlying neurological compromise as well as acute respiratory infection  Acute respiratory failure with hypoxia: 1) recent MSSA and serratia pneumonia  2) acute Klebsiella and Serratia tracheobronchitis -Completed Zosyn on 12/21 -Transferred to ICU on 12/23 as noted above and was found to have acute tracheobronchitis.  Required short-term mechanical ventilation but now back on trach collar -Klebsiella resistant to ampicillin and Serratia resistant to cefazolin.  Continue cefepime-duration of course of antibiotics at discretion of pulmonary medicine  Essential hypertension/EKG with LVH strain -Blood pressure on admission was elevated to over 979 systolic. -Currently controlled on max dose Coreg and clonidine -Add hydralazine per tube given inadequately controlled diastolic hypertension and known LVH -Has IV Apresoline and Normodyne available prn   AKI on CKD 3b -Baseline renal function: January 2022 BUN was 18 and creatinine 1.49 -Current creatinine 1.34 with a BUN of 36   Hypernatremia Resolved -Na+ peak 154 with current reading 140   Type 2 diabetes mellitus -A1c 6.3 in 12/31/2020 -Continue Levemir 20 units BID along with SSI every 4 hours   Protein calorie malnutrition/dysphagia Nutrition Problem: Inadequate oral  intake Etiology: lethargy/confusion, dysphagia Signs/Symptoms: NPO status Interventions: Tube feeding via PEG Body mass index is 26.73 kg/m.    Acute urinary retention  -Continue Urecholine -Currently has Foley catheter. -Once has recovered from current episode of tracheobronchitis can begin Foley clamping trials to see if can remove Foley   Goals of care -Poor prognosis.  No neurological recovery in last several days.  In fact repeat MRI on 12/16 shows worsening stroke compared to scan on admission.  -Multiple discussions held with family per pulmonary medicine team as well as palliative medicine. They want to continue aggressive care with FULL CODE STATUS at this time.         Data Reviewed: Basic Metabolic Panel: Recent Labs  Lab 01/23/21 0159 01/24/21 1027 01/25/21 0836 01/26/21 0107  NA 139 140 139 140  K 3.7 4.3 4.1 4.0  CL 112* 112* 111 112*  CO2 20* 20* 20* 19*  GLUCOSE 127* 141* 161* 125*  BUN 35* 37* 35* 36*  CREATININE 1.49* 1.36* 1.28* 1.34*  CALCIUM 8.6* 8.8* 8.8* 8.8*    CBC: Recent Labs  Lab 01/21/21 0232 01/22/21 0206 01/23/21 0159 01/24/21 1027 01/25/21 0836 01/26/21 0107  WBC 9.7 10.1 10.2 12.7* 11.9* 11.4*  NEUTROABS 7.2 7.4 7.3  --   --   --   HGB 9.4* 10.4* 10.4* 9.7* 9.6* 9.3*  HCT 29.8* 32.9* 32.1* 30.8* 29.7* 28.9*  MCV 75.3* 75.3* 74.3* 75.7* 74.4* 75.1*  PLT 331 339 314 279 253 234     CBG: Recent Labs  Lab 01/26/21 1144 01/26/21 1740 01/26/21 1940 01/27/21 0002 01/27/21 0456  GLUCAP 149* 155* 149* 141* 144*    Recent Results (from the past 240 hour(s))  Culture, blood (routine x 2)     Status: None (Preliminary result)   Collection Time: 01/24/21 10:27 AM   Specimen:  BLOOD  Result Value Ref Range Status   Specimen Description BLOOD SITE NOT SPECIFIED  Final   Special Requests   Final    BOTTLES DRAWN AEROBIC ONLY Blood Culture results may not be optimal due to an inadequate volume of blood received in culture  bottles   Culture   Final    NO GROWTH 3 DAYS Performed at [facility], [place] [address] [place], [place] [address]    Report Status PENDING  Incomplete  Culture, blood (routine x 2)     Status: None (Preliminary result)   Collection Time: 01/24/21 10:27 AM   Specimen: BLOOD  Result Value Ref Range Status   Specimen Description BLOOD SITE NOT SPECIFIED  Final   Special Requests   Final    BOTTLES DRAWN AEROBIC ONLY Blood Culture results may not be optimal due to an inadequate volume of blood received in culture bottles   Culture   Final    NO GROWTH 3 DAYS Performed at [facility], [place] [address] [place], [place] [address]    Report Status PENDING  Incomplete  Culture, Respiratory w Gram Stain     Status: None   Collection Time: 01/24/21 10:54 AM   Specimen: Tracheal Aspirate  Result Value Ref Range Status   Specimen Description TRACHEAL ASPIRATE  Final   Special Requests NONE  Final   Gram Stain   Final    MODERATE WBC PRESENT,BOTH PMN AND MONONUCLEAR RARE GRAM POSITIVE COCCI RARE GRAM NEGATIVE RODS Performed at [facility], [address] [place], [place] [address]    Culture   Final    FEW SERRATIA MARCESCENS FEW KLEBSIELLA PNEUMONIAE    Report Status 01/26/2021 FINAL  Final   Organism ID, Bacteria SERRATIA MARCESCENS  Final   Organism ID, Bacteria KLEBSIELLA PNEUMONIAE  Final      Susceptibility   Klebsiella pneumoniae - MIC*    AMPICILLIN >=32 RESISTANT Resistant     CEFAZOLIN <=4 SENSITIVE Sensitive     CEFEPIME <=0.12 SENSITIVE Sensitive     CEFTAZIDIME <=1 SENSITIVE Sensitive     CEFTRIAXONE <=0.25 SENSITIVE Sensitive     CIPROFLOXACIN <=0.25 SENSITIVE Sensitive     GENTAMICIN <=1 SENSITIVE Sensitive     IMIPENEM <=0.25 SENSITIVE Sensitive     TRIMETH/SULFA <=20 SENSITIVE Sensitive     AMPICILLIN/SULBACTAM 4 SENSITIVE Sensitive     PIP/TAZO <=4 SENSITIVE Sensitive     * FEW KLEBSIELLA PNEUMONIAE   Serratia marcescens - MIC*     CEFAZOLIN >=64 RESISTANT Resistant     CEFEPIME <=0.12 SENSITIVE Sensitive     CEFTAZIDIME <=1 SENSITIVE Sensitive     CEFTRIAXONE <=0.25 SENSITIVE Sensitive     CIPROFLOXACIN <=0.25 SENSITIVE Sensitive     GENTAMICIN <=1 SENSITIVE Sensitive     TRIMETH/SULFA <=20 SENSITIVE Sensitive     * FEW SERRATIA MARCESCENS     Studies: DG Abd Portable 1V  Result Date: 01/26/2021 CLINICAL DATA:  Abdominal distention EXAM: PORTABLE ABDOMEN - 1 VIEW COMPARISON:  01/03/2021 FINDINGS: Bowel gas pattern is nonspecific. Gastrostomy tube is noted in place. Gas is present in colon and rectum. IMPRESSION: Nonspecific bowel gas pattern.  Gastrostomy tube is noted in place. Electronically Signed   By: Elmer Picker M.D.   On: 01/26/2021 10:13    Scheduled Meds:  bethanechol  25 mg Per Tube TID   carvedilol  25 mg Per Tube BID WC   chlorhexidine gluconate (MEDLINE KIT)  15 mL Mouth Rinse BID  Chlorhexidine Gluconate Cloth  6 each Topical Q0600   cloNIDine  0.1 mg Per Tube Q8H   feeding supplement (PROSource TF)  45 mL Per Tube Daily   fiber  1 packet Per Tube BID   free water  100 mL Per Tube Q4H   heparin injection (subcutaneous)  5,000 Units Subcutaneous Q8H   hydrALAZINE  25 mg Per Tube Q8H   insulin aspart  0-20 Units Subcutaneous Q4H   insulin detemir  20 Units Subcutaneous BID   mouth rinse  15 mL Mouth Rinse 10 times per day   pantoprazole sodium  40 mg Per Tube Daily   Continuous Infusions:  ceFEPime (MAXIPIME) IV 2 g (01/27/21 0310)   feeding supplement (GLUCERNA 1.2 CAL) 70 mL/hr at 01/27/21 0509    Principal Problem:   ICH (intracerebral hemorrhage) ([place]) Active Problems:   Malignant hypertension   Diabetes mellitus type 2 in obese ([place])   Acute respiratory failure ([place])   Tracheostomy dependence ([place])   Pneumonia due to Serratia marcescens ([place])   MSSA (methicillin susceptible Staphylococcus aureus) pneumonia ([place])   Stage 3b chronic kidney disease (CKD) ([place])   Acute  kidney injury ([place])   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

## 2021-01-27 NOTE — Progress Notes (Signed)
Physical Therapy Treatment Patient Details Name: Stephen Delgado MRN: [id number] DOB: 10/06/1965 Today's Date: 01/27/2021   History of Present Illness 55 y/o presented to [facility] ED on 11/29 for AMS, lethargy, and vomiting. Transferred to [facility]. CTH showed L caudate/BG hemorrhage with IVH. Declining respiratory status with snoring respirations. Intubated 12/3. Trach placed 12/13.on 12/16, repeat MRI showed an increase in the size and number of acute infarcts in the bilateral hemispheres with increased involvement in the cerebellum bilaterally. On 12/23, pt transferred to ICU for worsening respiratory status due to tracheobronchitis, requiring short term mechanical ventilation. PMH: HTN, sleep apnea, DM, CKD 3    PT Comments    PT reassessed pt following stay in ICU. Pt continues to not follow any commands, no response to noxious stimuli any extremity observed. Pt with significant edema all extremities R>L, PT utilizing ROM and pillow/blanket roll propping to alleviate edema. PT encouraged RN to place order for air mattress and prevalon boots to maintain pt skin integrity. PT to sign off at this time, please re-consult if appropriate.     Recommendations for follow up therapy are one component of a multi-disciplinary discharge planning process, led by the attending physician.  Recommendations may be updated based on patient status, additional functional criteria and insurance authorization.  Follow Up Recommendations  PT at Long-term acute care hospital (vs SNF pending family wishes)     Assistance Recommended at Discharge Frequent or constant Supervision/Assistance  Equipment Recommendations  Other (comment) (tbd)    Recommendations for Other Services       Precautions / Restrictions Precautions Precautions: Fall Precaution Comments: G-tube, trach collar, flexiseal, foley, BUE/pedal edema Restrictions Weight Bearing Restrictions: No     Mobility  Bed Mobility               General  bed mobility comments: Pt is total care. placement in chair positioning to promote pulmonary health and change pressure distribution, UEs R>L propped with pillows and blanket rolls with edema control    Transfers                   General transfer comment: not attempted    Ambulation/Gait                   Stairs             Wheelchair Mobility    Modified Rankin (Stroke Patients Only) Modified Rankin (Stroke Patients Only) Pre-Morbid Rankin Score: No significant disability Modified Rankin: Severe disability     Balance                                            Cognition Arousal/Alertness: Lethargic Behavior During Therapy: Flat affect Overall Cognitive Status: Difficult to assess                                 General Comments: x2 yawns and x1 bout of productive coughing, pt with eyes fluttering open but no engagement with PT        Exercises General Exercises - Upper Extremity Elbow Flexion: PROM;Both;10 reps;Supine Elbow Extension: PROM;Both;10 reps;Supine Wrist Flexion: PROM;Both;10 reps;Supine Wrist Extension: PROM;Both;10 reps;Supine Composite Extension: PROM;Both;Supine;5 reps General Exercises - Lower Extremity Heel Slides: PROM;Both;10 reps;Supine    General Comments        Pertinent Vitals/Pain Pain Assessment:  Faces Faces Pain Scale: No hurt Pain Location: no response to noxious stimuli    Home Living                          Prior Function            PT Goals (current goals can now be found in the care plan section) Acute Rehab PT Goals Patient Stated Goal: unable PT Goal Formulation: Patient unable to participate in goal setting Time For Goal Achievement: 01/28/21 Potential to Achieve Goals: Poor Progress towards PT goals: Not progressing toward goals - comment (no interaction with PT, lethargic, no command following)    Frequency    Other (Comment) (PT to sign  off)      PT Plan Other (comment) (PT to sign off)    Co-evaluation              AM-PAC PT "6 Clicks" Mobility   Outcome Measure  Help needed turning from your back to your side while in a flat bed without using bedrails?: Total Help needed moving from lying on your back to sitting on the side of a flat bed without using bedrails?: Total Help needed moving to and from a bed to a chair (including a wheelchair)?: Total Help needed standing up from a chair using your arms (e.g., wheelchair or bedside chair)?: Total Help needed to walk in hospital room?: Total Help needed climbing 3-5 steps with a railing? : Total 6 Click Score: 6    End of Session Equipment Utilized During Treatment: Oxygen (via trach) Activity Tolerance: Patient limited by lethargy Patient left: in bed;with call bell/phone within reach;with bed alarm set;with nursing/sitter in room Nurse Communication: Mobility status PT Visit Diagnosis: Muscle weakness (generalized) (M62.81);Other symptoms and signs involving the nervous system (R29.898)     Time: [id number] PT Time Calculation (min) (ACUTE ONLY): 14 min  Charges:  $Therapeutic Activity: 8-22 mins                     Stacie Glaze, PT DPT Acute Rehabilitation Services Pager [phone]  Office [phone]    [place] E Ruffin Pyo 01/27/2021, 4:00 PM

## 2021-01-27 NOTE — TOC Progression Note (Signed)
Transition of Care [facility]) - Progression Note    Patient Details  Name: Stephen Delgado MRN: [id number] Date of Birth: Sep 30, 1965  [facility] CM/SW Contact  [name], RN Phone Number: 01/27/2021, 2:29 PM  Clinical Narrative:    CM and MSW assumed care of the patient for discharge planning and communication with family for likely SNF placement.  I called the patient's wife, Stephen Delgado, and left a voice mail on her phone at this time.  The patient's current insurance carrier is [employer] and I will follow up with family to confirm coverage and needed SNF placement. The patient had a tracheostomy placed on 01/13/2021 and will need to be 5 days old before SNF placement is available to the patient for rehabilitation.  The patient did not receive bed offers for LTAC.  CM and MSW with DTP Team will continue to explore options for SNF placement.   Expected Discharge Plan: Skilled Nursing Facility Barriers to Discharge: Continued Medical Work up  Expected Discharge Plan and Services Expected Discharge Plan: Skilled Nursing Facility   Discharge Planning Services: CM Consult Post Acute Care Choice: Skilled Nursing Facility Living arrangements for the past 2 months: Single Family Home                                       Social Determinants of Health (SDOH) Interventions    Readmission Risk Interventions No flowsheet data found.

## 2021-01-28 ENCOUNTER — Inpatient Hospital Stay (HOSPITAL_COMMUNITY): Payer: 59

## 2021-01-28 DIAGNOSIS — I610 Nontraumatic intracerebral hemorrhage in hemisphere, subcortical: Secondary | ICD-10-CM | POA: Diagnosis not present

## 2021-01-28 DIAGNOSIS — R066 Hiccough: Secondary | ICD-10-CM | POA: Insufficient documentation

## 2021-01-28 DIAGNOSIS — R140 Abdominal distension (gaseous): Secondary | ICD-10-CM | POA: Diagnosis not present

## 2021-01-28 DIAGNOSIS — I6320 Cerebral infarction due to unspecified occlusion or stenosis of unspecified precerebral arteries: Secondary | ICD-10-CM | POA: Diagnosis not present

## 2021-01-28 DIAGNOSIS — R338 Other retention of urine: Secondary | ICD-10-CM | POA: Diagnosis not present

## 2021-01-28 LAB — GLUCOSE, CAPILLARY
Glucose-Capillary: 122 mg/dL — ABNORMAL HIGH (ref 70–99)
Glucose-Capillary: 135 mg/dL — ABNORMAL HIGH (ref 70–99)
Glucose-Capillary: 128 mg/dL — ABNORMAL HIGH (ref 70–99)
Glucose-Capillary: 132 mg/dL — ABNORMAL HIGH (ref 70–99)
Glucose-Capillary: 120 mg/dL — ABNORMAL HIGH (ref 70–99)

## 2021-01-28 MED ORDER — CHLORPROMAZINE HCL 25 MG/ML IJ SOLN
50.0000 mg | Freq: Once | INTRAMUSCULAR | Status: AC
  Administered 2021-01-28: 09:00:00 50 mg via INTRAMUSCULAR
  Filled 2021-01-28 (×2): qty 2

## 2021-01-28 MED ORDER — POLYETHYLENE GLYCOL 3350 17 G PO PACK
17.0000 g | PACK | Freq: Every day | ORAL | Status: DC
  Administered 2021-01-28 – 2021-02-09 (×13): 17 g
  Filled 2021-01-28 (×13): qty 1

## 2021-01-28 NOTE — Plan of Care (Signed)
°  Problem: Education: Goal: Knowledge of disease or condition will improve Outcome: Progressing Goal: Knowledge of secondary prevention will improve (SELECT ALL) Outcome: Progressing   Problem: Coping: Goal: Will verbalize positive feelings about self Outcome: Progressing   Problem: Health Behavior/Discharge Planning: Goal: Ability to manage health-related needs will improve Outcome: Progressing   Problem: Self-Care: Goal: Ability to participate in self-care as condition permits will improve Outcome: Progressing

## 2021-01-28 NOTE — Progress Notes (Signed)
Occupational Therapy Treatment Patient Details Name: Stephen Delgado MRN: [id number] DOB: 02/28/1965 Today's Date: 01/28/2021   History of present illness 55 y/o presented to [facility] ED on 11/29 for AMS, lethargy, and vomiting. Transferred to [facility]. CTH showed L caudate/BG hemorrhage with IVH. Declining respiratory status with snoring respirations. Intubated 12/3. Trach placed 12/13.on 12/16, repeat MRI showed an increase in the size and number of acute infarcts in the bilateral hemispheres with increased involvement in the cerebellum bilaterally. On 12/23, pt transferred to ICU for worsening respiratory status due to tracheobronchitis, requiring short term mechanical ventilation. PMH: HTN, sleep apnea, DM, CKD 3   OT comments  Patient received in bed and was unresponsive to noxious stimuli.  PROM performed to BUE to address edema and tone and positioned in bed to provided elevation.  Acute OT to continue to follow to address following directions.    Recommendations for follow up therapy are one component of a multi-disciplinary discharge planning process, led by the attending physician.  Recommendations may be updated based on patient status, additional functional criteria and insurance authorization.    Follow Up Recommendations  Skilled nursing-short term rehab (<3 hours/day)    Assistance Recommended at Discharge Frequent or constant Supervision/Assistance  Equipment Recommendations  Other (comment)    Recommendations for Other Services      Precautions / Restrictions Precautions Precautions: Fall Precaution Comments: G-tube, trach collar, flexiseal, foley, BUE/pedal edema Restrictions Weight Bearing Restrictions: No       Mobility Bed Mobility                    Transfers                         Balance                                           ADL either performed or assessed with clinical judgement   ADL                                               Extremity/Trunk Assessment              Vision       Perception     Praxis      Cognition Arousal/Alertness: Lethargic Behavior During Therapy: Flat affect Overall Cognitive Status: Difficult to assess                                 General Comments: slight head movement during ROM of UEs          Exercises Exercises: Other exercises General Exercises - Upper Extremity Shoulder Flexion: PROM;Both;10 reps;Supine Shoulder Extension: PROM;10 reps;Both;Supine Shoulder ABduction: PROM;Both;10 reps;Supine Shoulder ADduction: PROM;Both;10 reps;Supine Elbow Flexion: PROM;Both;10 reps;Supine Elbow Extension: PROM;Both;10 reps;Supine Wrist Flexion: PROM;Both;10 reps;Supine Wrist Extension: PROM;Both;10 reps;Supine Digit Composite Flexion: PROM;Both;10 reps;Supine Composite Extension: PROM;Both;Supine;5 reps   Shoulder Instructions       General Comments      Pertinent Vitals/ Pain       Pain Assessment: Faces Faces Pain Scale: No hurt  Home Living  Prior Functioning/Environment              Frequency  Min 1X/week        Progress Toward Goals  OT Goals(current goals can now be found in the care plan section)  Progress towards OT goals: Not progressing toward goals - comment (due to level of arousal)  Acute Rehab OT Goals OT Goal Formulation: Patient unable to participate in goal setting Time For Goal Achievement: 01/30/21 Potential to Achieve Goals: Fair ADL Goals Additional ADL Goal #1: Pt will follow 1 step commands 1/4 times as precursor for ADL tasks  Plan Discharge plan remains appropriate    Co-evaluation                 AM-PAC OT "6 Clicks" Daily Activity     Outcome Measure   Help from another person eating meals?: Total Help from another person taking care of personal grooming?: Total Help from another person toileting,  which includes using toliet, bedpan, or urinal?: Total Help from another person bathing (including washing, rinsing, drying)?: Total Help from another person to put on and taking off regular upper body clothing?: Total Help from another person to put on and taking off regular lower body clothing?: Total 6 Click Score: 6    End of Session    OT Visit Diagnosis: Other abnormalities of gait and mobility (R26.89);Muscle weakness (generalized) (M62.81);Low vision, both eyes (H54.2);Other symptoms and signs involving the nervous system (R29.898);Other symptoms and signs involving cognitive function;Cognitive communication deficit (R41.841) Symptoms and signs involving cognitive functions: Nontraumatic SAH;Other Nontraumatic ICH   Activity Tolerance Patient limited by lethargy   Patient Left in bed;with call bell/phone within reach;with bed alarm set   Nurse Communication Other (comment) (level of participation)        Time: 3614-4315 OT Time Calculation (min): 14 min  Charges: OT General Charges $OT Visit: 1 Visit OT Treatments $Therapeutic Activity: 8-22 mins  Alfonse Flavors, OTA Acute Rehabilitation Services  Pager [phone] Office [phone]   Dewain Penning 01/28/2021, 3:14 PM

## 2021-01-28 NOTE — Progress Notes (Signed)
[facility] PROGRESS NOTE  Stephen Delgado MGN:[id number] DOB: [birth year] DOA: 12/30/2020 PCP: Patient, [name]  Status: Remains inpatient appropriate because:  Unsafe discharge plan.  Patient in persistent vegetative state per stroke needs placement at skilled nursing facility that is trach capable  Barriers to discharge: Social: Current insurance coverage about to expire and he otherwise has no funding for long-term care.  Clarifying regarding application for disability and Medicaid  Clinical: Tracheostomy and PEG dependent  Level of care:  Med-Surg but transferring back to ICU for mechanical ventilation and stat CT of head due to neurological change as of 12/23   Code Status: Full code Family Communication: Updated wife at bedside as to rationale why patient needed to transfer back to ICU and undergo a CT scan.  Made her aware that change in respiratory pattern could be indicative of worsening status that could lead to brain herniation and eventual brain death/body death DVT prophylaxis: SCDs COVID vaccination status: Unknown  Foley catheter: 31 French inserted on 12/17 POA: No  HPI: 55 y.o. male with PMH significant for DM2, HTN, CKD with questionable compliance to medications Patient presented to the ED on 12/30/2020 with complaint of headache, altered mental status   In the ED, he was hypertensive to 238/160, agitated, required restraints CT head showed large amount of intraventricular hemorrhage involving lateral/third/fourth ventricle, ICH and left basal ganglia as well as chronic microvascular ischemic changes. He was started on Cleviprex drip and admitted to neuro ICU   He subsequently had CT head repeated on 11/30, 12/1 and 12/3, all demonstrated unchanged intraparenchymal hemorrhage and intraventricular hemorrhage 12/3, MRI brain from also showed numerous small acute infarcts in both cerebral hemisphere with minimal improvement in the right  superior cerebellum 12/3, intubated 12/13, tracheostomy 12/15, wean from vent to trach collar 12/16, PEG tube placement 12/16, repeat MRI showed an increase in the size and number of acute infarcts in the bilateral hemispheres with increased involvement in the cerebellum bilaterally.   12/18, transferred from neuro service to [facility]  12/21, palliative care consulted.  Family wanted to continue aggressive care as full CODE STATUS. 12/23 developed Cheyne-Stokes respiratory pattern with respiratory alkalosis.  Transferred to ICU for short-term mechanical ventilation and was found to have new tracheobronchitis now on cefepime   His hospital course has been complicated by significant neurological impairment, aspiration events, AKI, hypernatremia, urinary retention.  Subjective: Unresponsive  Objective: Vitals:   01/28/21 0509 01/28/21 0510  BP: (!) 178/101 (!) 177/85  Pulse: 92 95  Resp:  18  Temp:  100 F (37.8 C)  SpO2:  98%    Intake/Output Summary (Last 24 hours) at 01/28/2021 0734 Last data filed at 01/28/2021 0519 Gross per 24 hour  Intake 9439 ml  Output 1700 ml  Net 7739 ml   Filed Weights   01/24/21 0359 01/26/21 0500 01/28/21 0408  Weight: 88.3 kg 89.4 kg 87.7 kg    Exam:  Constitutional: Remains unresponsive Respiratory: #8.0 Shiley cuffed trach, tachypneic respiratory pattern similar to [place], no further apnea. Bilateral expiratory rhonchi which are less coarse than yesterday Cardiovascular: S1-S2, sinus tachycardia, normotensive, no peripheral edema Abdomen: no tenderness, no masses palpated.  Abdomen slightly distended but soft.  PEG tube for feedings bowel sounds positive. LBM 12/26-rectal Foley in place with brown drainage.   Neurologic: Unable to accurately test cranial nerves.  Eyes closed.  No blink reflex when tested.  Manually opened both eyes with eye positioning midline- unresponsive even to painful stimulus Psychiatric:  Unresponsive   Assessment/Plan: Acute problems: Acute intraparenchymal and intraventricular hemorrhage Bilateral multifocal ischemic infarcts -Imaging and admission reviewed ischemic and hemorrhagic stroke likely from combination of uncontrolled hypertension and small vessel disease.  -no improvement in multiple repeat imagings.  Follow-up MRI on 12/16 demonstrated increase in size and number of acute infarcts  -Neurology has signed off as of 12/22 as they have nothing further to offer.  Currently on no blood thinners because of intracranial hemorrhage. -12/23 recent Cheyne-Stokes respiratory pattern combination of underlying neurological compromise as well as acute respiratory infection  Acute respiratory failure with hypoxia: 1) recent MSSA and serratia pneumonia  2) acute Klebsiella and Serratia tracheobronchitis -Completed Zosyn on 12/21 -Transferred to ICU on 12/23 as noted above and was found to have acute tracheobronchitis.  Required short-term mechanical ventilation but now back on trach collar -Klebsiella resistant to ampicillin and Serratia resistant to cefazolin.  After consultation with pharmacist de-escalated to Bactrim  Essential hypertension/EKG with LVH strain -Blood pressure on admission was elevated to over 774 systolic. -Currently controlled on max dose Coreg and clonidine -Add hydralazine per tube given inadequately controlled diastolic hypertension and known LVH -Has IV Apresoline and Normodyne available prn  Intractable hiccups -Thorazine 50 mg IM x1 dose  Mild abdominal distention/?  Constipation -Nursing reports no output in rectal Foley for several days -Continues to demonstrate mild abdominal distention that is soft and appears to be nontender -Normal films completed on 12/26 demonstrate nonspecific bowel gas pattern with gas present in colon and rectum.  Because of lack of stool output in continued distention abdominal films repeated today that are unchanged  and are without any significant stool burden or evidence of this obstruction -As a precaution attending MD has initiated scheduled MiraLAX   AKI on CKD 3b -Baseline renal function: January 2022 BUN was 18 and creatinine 1.49 -Current creatinine 1.34 with a BUN of 36   Hypernatremia Resolved -Na+ peak 154 with current reading 140   Type 2 diabetes mellitus -A1c 6.3 in 12/31/2020 -Continue Levemir 20 units BID along with SSI every 4 hours   Protein calorie malnutrition/dysphagia Nutrition Problem: Inadequate oral intake Etiology: lethargy/confusion, dysphagia Signs/Symptoms: NPO status Interventions: Tube feeding via PEG Body mass index is 26.22 kg/m.    Acute urinary retention  -Continue Urecholine -Currently has Foley catheter. -Once has recovered from current episode of tracheobronchitis can begin Foley clamping trials to see if can remove Foley   Goals of care -Poor prognosis.  No neurological recovery in last several days.  In fact repeat MRI on 12/16 shows worsening stroke compared to scan on admission.  -Multiple discussions held with family per pulmonary medicine team as well as palliative medicine. They want to continue aggressive care with FULL CODE STATUS at this time.         Data Reviewed: Basic Metabolic Panel: Recent Labs  Lab 01/23/21 0159 01/24/21 1027 01/25/21 0836 01/26/21 0107  NA 139 140 139 140  K 3.7 4.3 4.1 4.0  CL 112* 112* 111 112*  CO2 20* 20* 20* 19*  GLUCOSE 127* 141* 161* 125*  BUN 35* 37* 35* 36*  CREATININE 1.49* 1.36* 1.28* 1.34*  CALCIUM 8.6* 8.8* 8.8* 8.8*    CBC: Recent Labs  Lab 01/22/21 0206 01/23/21 0159 01/24/21 1027 01/25/21 0836 01/26/21 0107  WBC 10.1 10.2 12.7* 11.9* 11.4*  NEUTROABS 7.4 7.3  --   --   --   HGB 10.4* 10.4* 9.7* 9.6* 9.3*  HCT 32.9* 32.1* 30.8* 29.7* 28.9*  MCV  75.3* 74.3* 75.7* 74.4* 75.1*  PLT 339 314 279 253 234     CBG: Recent Labs  Lab 01/27/21 1723 01/27/21 1954 01/27/21 2142  01/27/21 2343 01/28/21 0316  GLUCAP 124* 123* 120* 140* 122*    Recent Results (from the past 240 hour(s))  Culture, blood (routine x 2)     Status: None (Preliminary result)   Collection Time: 01/24/21 10:27 AM   Specimen: BLOOD  Result Value Ref Range Status   Specimen Description BLOOD SITE NOT SPECIFIED  Final   Special Requests   Final    BOTTLES DRAWN AEROBIC ONLY Blood Culture results may not be optimal due to an inadequate volume of blood received in culture bottles   Culture   Final    NO GROWTH 4 DAYS Performed at [facility], [place] [address], [place], [place] [address]    Report Status PENDING  Incomplete  Culture, blood (routine x 2)     Status: None (Preliminary result)   Collection Time: 01/24/21 10:27 AM   Specimen: BLOOD  Result Value Ref Range Status   Specimen Description BLOOD SITE NOT SPECIFIED  Final   Special Requests   Final    BOTTLES DRAWN AEROBIC ONLY Blood Culture results may not be optimal due to an inadequate volume of blood received in culture bottles   Culture   Final    NO GROWTH 4 DAYS Performed at [facility], [place] [address], [place], [place] [address]    Report Status PENDING  Incomplete  Culture, Respiratory w Gram Stain     Status: None   Collection Time: 01/24/21 10:54 AM   Specimen: Tracheal Aspirate  Result Value Ref Range Status   Specimen Description TRACHEAL ASPIRATE  Final   Special Requests NONE  Final   Gram Stain   Final    MODERATE WBC PRESENT,BOTH PMN AND MONONUCLEAR RARE GRAM POSITIVE COCCI RARE GRAM NEGATIVE RODS Performed at [facility], [address], [place], [place] [address]    Culture   Final    FEW SERRATIA MARCESCENS FEW KLEBSIELLA PNEUMONIAE    Report Status 01/26/2021 FINAL  Final   Organism ID, Bacteria SERRATIA MARCESCENS  Final   Organism ID, Bacteria KLEBSIELLA PNEUMONIAE  Final      Susceptibility   Klebsiella pneumoniae - MIC*    AMPICILLIN >=32 RESISTANT Resistant      CEFAZOLIN <=4 SENSITIVE Sensitive     CEFEPIME <=0.12 SENSITIVE Sensitive     CEFTAZIDIME <=1 SENSITIVE Sensitive     CEFTRIAXONE <=0.25 SENSITIVE Sensitive     CIPROFLOXACIN <=0.25 SENSITIVE Sensitive     GENTAMICIN <=1 SENSITIVE Sensitive     IMIPENEM <=0.25 SENSITIVE Sensitive     TRIMETH/SULFA <=20 SENSITIVE Sensitive     AMPICILLIN/SULBACTAM 4 SENSITIVE Sensitive     PIP/TAZO <=4 SENSITIVE Sensitive     * FEW KLEBSIELLA PNEUMONIAE   Serratia marcescens - MIC*    CEFAZOLIN >=64 RESISTANT Resistant     CEFEPIME <=0.12 SENSITIVE Sensitive     CEFTAZIDIME <=1 SENSITIVE Sensitive     CEFTRIAXONE <=0.25 SENSITIVE Sensitive     CIPROFLOXACIN <=0.25 SENSITIVE Sensitive     GENTAMICIN <=1 SENSITIVE Sensitive     TRIMETH/SULFA <=20 SENSITIVE Sensitive     * FEW SERRATIA MARCESCENS     Studies: DG Abd Portable 1V  Result Date: 01/26/2021 CLINICAL DATA:  Abdominal distention EXAM: PORTABLE ABDOMEN - 1 VIEW COMPARISON:  01/03/2021 FINDINGS: Bowel gas pattern is nonspecific. Gastrostomy tube  is noted in place. Gas is present in colon and rectum. IMPRESSION: Nonspecific bowel gas pattern.  Gastrostomy tube is noted in place. Electronically Signed   By: Elmer Picker M.D.   On: 01/26/2021 10:13    Scheduled Meds:  bethanechol  25 mg Per Tube TID   carvedilol  25 mg Per Tube BID WC   chlorhexidine gluconate (MEDLINE KIT)  15 mL Mouth Rinse BID   Chlorhexidine Gluconate Cloth  6 each Topical Q0600   cloNIDine  0.1 mg Per Tube Q8H   feeding supplement (PROSource TF)  45 mL Per Tube Daily   fiber  1 packet Per Tube BID   free water  100 mL Per Tube Q4H   heparin injection (subcutaneous)  5,000 Units Subcutaneous Q8H   hydrALAZINE  25 mg Per Tube Q8H   insulin aspart  0-20 Units Subcutaneous Q4H   insulin detemir  20 Units Subcutaneous BID   mouth rinse  15 mL Mouth Rinse 10 times per day   pantoprazole sodium  40 mg Per Tube Daily   sulfamethoxazole-trimethoprim  1 tablet Per Tube  Q12H   Continuous Infusions:  feeding supplement (GLUCERNA 1.2 CAL) 1,000 mL (01/28/21 0520)    Principal Problem:   ICH (intracerebral hemorrhage) ([place]) Active Problems:   Malignant hypertension   Diabetes mellitus type 2 in obese (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

## 2021-01-28 NOTE — Progress Notes (Signed)
Tele called to say ST elevation greater than 3 in v lead and 2.9 in MCL.  He is resting and remains unchanged.  No signs of distress. Paged Dr Leafy Half and he said the last EKG showed elevation and they are aware and since there are no clinical changes, he just noted that and said to call if any changes. Appears to be resting.

## 2021-01-29 ENCOUNTER — Inpatient Hospital Stay (HOSPITAL_COMMUNITY): Payer: 59

## 2021-01-29 DIAGNOSIS — R220 Localized swelling, mass and lump, head: Secondary | ICD-10-CM | POA: Diagnosis not present

## 2021-01-29 DIAGNOSIS — J150 Pneumonia due to Klebsiella pneumoniae: Secondary | ICD-10-CM

## 2021-01-29 DIAGNOSIS — I610 Nontraumatic intracerebral hemorrhage in hemisphere, subcortical: Secondary | ICD-10-CM | POA: Diagnosis not present

## 2021-01-29 DIAGNOSIS — R338 Other retention of urine: Secondary | ICD-10-CM | POA: Diagnosis not present

## 2021-01-29 DIAGNOSIS — R569 Unspecified convulsions: Secondary | ICD-10-CM | POA: Diagnosis not present

## 2021-01-29 DIAGNOSIS — I6320 Cerebral infarction due to unspecified occlusion or stenosis of unspecified precerebral arteries: Secondary | ICD-10-CM | POA: Diagnosis not present

## 2021-01-29 DIAGNOSIS — N179 Acute kidney failure, unspecified: Secondary | ICD-10-CM | POA: Diagnosis not present

## 2021-01-29 LAB — CBC WITH DIFFERENTIAL/PLATELET
Abs Immature Granulocytes: 0.14 10*3/uL — ABNORMAL HIGH (ref 0.00–0.07)
Basophils Absolute: 0 10*3/uL (ref 0.0–0.1)
Basophils Relative: 0 %
Eosinophils Absolute: 0.4 10*3/uL (ref 0.0–0.5)
Eosinophils Relative: 5 %
HCT: 31.9 % — ABNORMAL LOW (ref 39.0–52.0)
Hemoglobin: 10.2 g/dL — ABNORMAL LOW (ref 13.0–17.0)
Immature Granulocytes: 2 %
Lymphocytes Relative: 15 %
Lymphs Abs: 1.2 10*3/uL (ref 0.7–4.0)
MCH: 23.9 pg — ABNORMAL LOW (ref 26.0–34.0)
MCHC: 32 g/dL (ref 30.0–36.0)
MCV: 74.9 fL — ABNORMAL LOW (ref 80.0–100.0)
Monocytes Absolute: 0.7 10*3/uL (ref 0.1–1.0)
Monocytes Relative: 8 %
Neutro Abs: 6 10*3/uL (ref 1.7–7.7)
Neutrophils Relative %: 70 %
Platelets: 299 10*3/uL (ref 150–400)
RBC: 4.26 MIL/uL (ref 4.22–5.81)
RDW: 15.4 % (ref 11.5–15.5)
WBC: 8.4 10*3/uL (ref 4.0–10.5)
nRBC: 0 % (ref 0.0–0.2)

## 2021-01-29 LAB — COMPREHENSIVE METABOLIC PANEL
ALT: 79 U/L — ABNORMAL HIGH (ref 0–44)
AST: 43 U/L — ABNORMAL HIGH (ref 15–41)
Albumin: 2 g/dL — ABNORMAL LOW (ref 3.5–5.0)
Alkaline Phosphatase: 58 U/L (ref 38–126)
Anion gap: 9 (ref 5–15)
BUN: 39 mg/dL — ABNORMAL HIGH (ref 6–20)
CO2: 20 mmol/L — ABNORMAL LOW (ref 22–32)
Calcium: 9.1 mg/dL (ref 8.9–10.3)
Chloride: 109 mmol/L (ref 98–111)
Creatinine, Ser: 1.57 mg/dL — ABNORMAL HIGH (ref 0.61–1.24)
GFR, Estimated: 51 mL/min — ABNORMAL LOW (ref >60)
Glucose, Bld: 153 mg/dL — ABNORMAL HIGH (ref 70–99)
Potassium: 4.1 mmol/L (ref 3.5–5.1)
Sodium: 138 mmol/L (ref 135–145)
Total Bilirubin: 0.6 mg/dL (ref 0.3–1.2)
Total Protein: 6.4 g/dL — ABNORMAL LOW (ref 6.5–8.1)

## 2021-01-29 LAB — CULTURE, BLOOD (ROUTINE X 2)
Culture: NO GROWTH
Culture: NO GROWTH

## 2021-01-29 LAB — HEPATITIS PANEL, ACUTE
HCV Ab: NONREACTIVE
Hep A IgM: NONREACTIVE
Hep B C IgM: NONREACTIVE
Hepatitis B Surface Ag: NONREACTIVE

## 2021-01-29 LAB — GLUCOSE, CAPILLARY
Glucose-Capillary: 117 mg/dL — ABNORMAL HIGH (ref 70–99)
Glucose-Capillary: 140 mg/dL — ABNORMAL HIGH (ref 70–99)
Glucose-Capillary: 156 mg/dL — ABNORMAL HIGH (ref 70–99)
Glucose-Capillary: 156 mg/dL — ABNORMAL HIGH (ref 70–99)
Glucose-Capillary: 157 mg/dL — ABNORMAL HIGH (ref 70–99)
Glucose-Capillary: 161 mg/dL — ABNORMAL HIGH (ref 70–99)

## 2021-01-29 LAB — PROCALCITONIN: Procalcitonin: 0.15 ng/mL

## 2021-01-29 LAB — SEDIMENTATION RATE: Sed Rate: 78 mm/hr — ABNORMAL HIGH (ref 0–16)

## 2021-01-29 LAB — C-REACTIVE PROTEIN: CRP: 1.8 mg/dL — ABNORMAL HIGH (ref <1.0)

## 2021-01-29 LAB — CK: Total CK: 176 U/L (ref 49–397)

## 2021-01-29 MED ORDER — SCOPOLAMINE 1 MG/3DAYS TD PT72
1.0000 | MEDICATED_PATCH | TRANSDERMAL | Status: DC
  Administered 2021-01-29 – 2021-04-17 (×27): 1.5 mg via TRANSDERMAL
  Filled 2021-01-29 (×27): qty 1

## 2021-01-29 NOTE — Plan of Care (Signed)
  Problem: Clinical Measurements: Goal: Respiratory complications will improve Outcome: Progressing   Problem: Activity: Goal: Risk for activity intolerance will decrease Outcome: Progressing   Problem: Nutrition: Goal: Adequate nutrition will be maintained Outcome: Progressing   

## 2021-01-29 NOTE — Procedures (Signed)
Patient Name: Stephen Delgado  MRN: [id number]  Epilepsy Attending: Charlsie Quest  Referring Physician/Provider: Russella Dar, NP Date: 01/29/2021 Duration: 21.26 mins  Patient history: 56 year old male with ICH and IVH.  Noted to have a swollen lip with concern for unwitnessed seizure.  EEG to evaluate for seizure.   Level of alertness:  lethargic    AEDs during EEG study: None   Technical aspects: This EEG study was done with scalp electrodes positioned according to the 10-20 International system of electrode placement. Electrical activity was acquired at a sampling rate of 500Hz  and reviewed with a high frequency filter of 70Hz  and a low frequency filter of 1Hz . EEG data were recorded continuously and digitally stored.    Description: EEG showed continuous generalized 3 to 6 Hz theta-delta slowing.  Hyperventilation and photic stimulation were not performed.       ABNORMALITY - Continuous slow, generalized   IMPRESSION: This study is suggestive of moderate diffuse encephalopathy, nonspecific etiology. No seizures or epileptiform discharges were seen throughout the recording.   [name] 

## 2021-01-29 NOTE — Progress Notes (Signed)
EEG done at bedside. No skin breakdown noted. High Body temp =100.5, ? Sepsis/ protocol started, and high Blood Sugar level; in early am noted. Results pending.

## 2021-01-29 NOTE — Progress Notes (Addendum)
[facility] PROGRESS NOTE  Stephen Delgado VHQ:[id number] DOB: September 23, 1965 DOA: 12/30/2020 PCP: Patient, [name]   01/29/2021   01/29/2021  Status: Remains inpatient appropriate because:  Unsafe discharge plan.  Patient in persistent vegetative state per stroke needs placement at skilled nursing facility that is trach capable  Barriers to discharge: Social: Current insurance coverage about to expire and he otherwise has no funding for long-term care.  Clarifying regarding application for disability and Medicaid  Clinical: Tracheostomy and PEG dependent  Level of care:  Med-Surg but transferring back to ICU for mechanical ventilation and stat CT of head due to neurological change as of 12/23   Code Status: Full code Family Communication: Wife at bedside 12/23 DVT prophylaxis: SCDs COVID vaccination status: Unknown  Foley catheter: 43 French inserted on 12/17; changed on 12/24 POA: No  HPI: 55 y.o. male with PMH significant for DM2, HTN, CKD with questionable compliance to medications Patient presented to the ED on 12/30/2020 with complaint of headache, altered mental status   In the ED, he was hypertensive to 238/160, agitated, required restraints CT head showed large amount of intraventricular hemorrhage involving lateral/third/fourth ventricle, ICH and left basal ganglia as well as chronic microvascular ischemic changes. He was started on Cleviprex drip and admitted to neuro ICU   He subsequently had CT head repeated on 11/30, 12/1 and 12/3, all demonstrated unchanged intraparenchymal hemorrhage and intraventricular hemorrhage 12/3, MRI brain from also showed numerous small acute infarcts in both cerebral hemisphere with minimal improvement in the right superior cerebellum 12/3, intubated 12/13, tracheostomy 12/15, wean from vent to trach collar 12/16, PEG tube placement 12/16, repeat MRI showed an increase in the size and number of acute infarcts  in the bilateral hemispheres with increased involvement in the cerebellum bilaterally.   12/18, transferred from neuro service to [facility]  12/21, palliative care consulted.  Family wanted to continue aggressive care as full CODE STATUS. 12/23 developed Cheyne-Stokes respiratory pattern with respiratory alkalosis.  Transferred to ICU for short-term mechanical ventilation and was found to have new tracheobronchitis now on cefepime 12/25 back to progressive floor and to [facility]   His hospital course has been complicated by significant neurological impairment, aspiration events, AKI, hypernatremia, urinary retention.  Subjective: Unresponsive but did withdraw head when swollen bruised lip examined  Objective: Vitals:   01/29/21 0450 01/29/21 0721  BP: (!) 148/78   Pulse:  (!) 105  Resp:  18  Temp:    SpO2:  93%    Intake/Output Summary (Last 24 hours) at 01/29/2021 0736 Last data filed at 01/28/2021 1500 Gross per 24 hour  Intake --  Output 2200 ml  Net -2200 ml   Filed Weights   01/26/21 0500 01/28/21 0408 01/29/21 0500  Weight: 89.4 kg 87.7 kg 87.5 kg    Exam:  Constitutional: Remains unresponsive, diaphoretic ENT: New finding of swollen bruised lip-not actively bleeding, excessive oral secretions Respiratory: #8.0 Shiley cuffed trach, tachypneic respiratory pattern similar to [place], no further apnea. Bilateral expiratory rhonchi which are less coarse than yesterday Cardiovascular: S1-S2, sinus tachycardia, normotensive, no peripheral edema Abdomen: no tenderness, no masses palpated.  Abdomen slightly distended but soft.  PEG tube -hypoactive bowel sounds positive. LBM 12/28-rectal Foley in place with brown drainage and large volume of flatus in bag.   Neurologic: Unable to accurately test cranial nerves.  Eyes closed.  No blink reflex when tested.  Manually opened both eyes with eye positioning midline- unresponsive  Psychiatric: Unresponsive   Assessment/Plan: Acute  problems: Acute intraparenchymal and intraventricular hemorrhage Bilateral multifocal ischemic infarcts -Imaging and admission reviewed ischemic and hemorrhagic stroke likely from combination of uncontrolled hypertension and small vessel disease.  -no improvement in multiple repeat imagings.  Follow-up MRI on 12/16 demonstrated increase in size and number of acute infarcts  -Neurology has signed off as of 12/22 as they have nothing further to offer.  Currently on no blood thinners because of intracranial hemorrhage. -12/23 recent Cheyne-Stokes respiratory pattern combination of underlying neurological compromise as well as acute respiratory infection  Fever -TM 100.5 ax (101.5 PO equivalent) -Sepsis WU initiated (CBC (WBC normal), CMET, ESR (78) , CRP (1.8) , PCT (0.15) ,PCXR (Neg) , UA/urine cx, blood cxs -Noted with excessive oral secretions that patient is unable to manage.  This could be causing recurrent aspiration pneumonitis.  Scopolamine patch initiated  Swollen lip -concern for unwitnessed seizure -ck EEG consistent with moderate diffuse encephalopathy without evidence of seizures or epileptiform discharges.  CK 176 -significant brain injury 2/2 IVH and currently not on AEDs  Acute respiratory failure with hypoxia: 1) recent MSSA and serratia pneumonia  2) acute Klebsiella and Serratia tracheobronchitis -Completed Zosyn on 12/21 -Transferred to ICU on 12/23 as noted above and was found to have acute tracheobronchitis.  Required short-term mechanical ventilation but now back on trach collar -Klebsiella resistant to ampicillin and Serratia resistant to cefazolin.  After consultation with pharmacist de-escalated to Bactrim  Essential hypertension/EKG with LVH strain -Blood pressure on admission was elevated to over 701 systolic. -Currently controlled on max dose Coreg and clonidine -Add hydralazine per tube given inadequately controlled diastolic hypertension and known LVH -Has IV  Apresoline and Normodyne available prn  Intractable hiccups -Thorazine 50 mg IM x1 dose with resolution of sx's  Mild abdominal distention/?  Constipation -Lots of flatus with small amount thick liquid brown stool in RF bag -Normal films completed on 12/26 demonstrate nonspecific bowel gas pattern with gas present in colon and rectum.  Because of lack of stool output in continued distention abdominal films repeated today that are unchanged and are without any significant stool burden or evidence of this obstruction -As a precaution attending MD has initiated scheduled MiraLAX   AKI on CKD 3b -Baseline renal function: January 2022 BUN was 18 and creatinine 1.49 -Current creatinine 1.57 with a BUN of 39-watch closely on Bactrim   Hypernatremia Resolved -Na+ peak 154 with current reading 138  Non obstructive transaminitis -Recurrent issue throughout hospitalization -Peak AST 163 and ALT 133 on 12/14.  On 12/16 had decreased to 58 and 76. -Today on 12/29 AST 43 and ALT 79 -CT abdomen and pelvis 12/23 unremarkable no evidence of cirrhosis or other biliary tree issues -Follow closely on Tylenol and Bactrim; carvedilol can cause ALT or AST elevation as well -Hep C antibody within normal limit -Check complete hepatitis panel   Type 2 diabetes mellitus -A1c 6.3 in 12/31/2020 -Continue Levemir 20 units BID along with SSI every 4 hours   Protein calorie malnutrition/dysphagia Nutrition Problem: Inadequate oral intake Etiology: lethargy/confusion, dysphagia Signs/Symptoms: NPO status Interventions: Tube feeding via PEG Body mass index is 26.16 kg/m.    Acute urinary retention  -Continue Urecholine -Currently has Foley catheter.  Last exchanged on 12/23 -Once has recovered from current episode of tracheobronchitis can begin Foley clamping trials to see if can remove Foley   Goals of care -Poor prognosis.  No neurological recovery in last several days.  In fact repeat MRI on 12/16  shows worsening stroke compared to scan  on admission.  -Multiple discussions held with family per pulmonary medicine team as well as palliative medicine. They want to continue aggressive care with FULL CODE STATUS at this time.         Data Reviewed: Basic Metabolic Panel: Recent Labs  Lab 01/23/21 0159 01/24/21 1027 01/25/21 0836 01/26/21 0107  NA 139 140 139 140  K 3.7 4.3 4.1 4.0  CL 112* 112* 111 112*  CO2 20* 20* 20* 19*  GLUCOSE 127* 141* 161* 125*  BUN 35* 37* 35* 36*  CREATININE 1.49* 1.36* 1.28* 1.34*  CALCIUM 8.6* 8.8* 8.8* 8.8*    CBC: Recent Labs  Lab 01/23/21 0159 01/24/21 1027 01/25/21 0836 01/26/21 0107  WBC 10.2 12.7* 11.9* 11.4*  NEUTROABS 7.3  --   --   --   HGB 10.4* 9.7* 9.6* 9.3*  HCT 32.1* 30.8* 29.7* 28.9*  MCV 74.3* 75.7* 74.4* 75.1*  PLT 314 279 253 234     CBG: Recent Labs  Lab 01/28/21 1134 01/28/21 1531 01/28/21 2046 01/29/21 0009 01/29/21 0409  GLUCAP 128* 132* 120* 117* 161*    Recent Results (from the past 240 hour(s))  Culture, blood (routine x 2)     Status: None (Preliminary result)   Collection Time: 01/24/21 10:27 AM   Specimen: BLOOD  Result Value Ref Range Status   Specimen Description BLOOD SITE NOT SPECIFIED  Final   Special Requests   Final    BOTTLES DRAWN AEROBIC ONLY Blood Culture results may not be optimal due to an inadequate volume of blood received in culture bottles   Culture   Final    NO GROWTH 4 DAYS Performed at [facility], [place] [address], [place], [place] [address]    Report Status PENDING  Incomplete  Culture, blood (routine x 2)     Status: None (Preliminary result)   Collection Time: 01/24/21 10:27 AM   Specimen: BLOOD  Result Value Ref Range Status   Specimen Description BLOOD SITE NOT SPECIFIED  Final   Special Requests   Final    BOTTLES DRAWN AEROBIC ONLY Blood Culture results may not be optimal due to an inadequate volume of blood received in culture bottles   Culture   Final     NO GROWTH 4 DAYS Performed at [facility], [place] [address], [place], [place] [address]    Report Status PENDING  Incomplete  Culture, Respiratory w Gram Stain     Status: None   Collection Time: 01/24/21 10:54 AM   Specimen: Tracheal Aspirate  Result Value Ref Range Status   Specimen Description TRACHEAL ASPIRATE  Final   Special Requests NONE  Final   Gram Stain   Final    MODERATE WBC PRESENT,BOTH PMN AND MONONUCLEAR RARE GRAM POSITIVE COCCI RARE GRAM NEGATIVE RODS Performed at [facility], [address] [place], [place] [address]    Culture   Final    FEW SERRATIA MARCESCENS FEW KLEBSIELLA PNEUMONIAE    Report Status 01/26/2021 FINAL  Final   Organism ID, Bacteria SERRATIA MARCESCENS  Final   Organism ID, Bacteria KLEBSIELLA PNEUMONIAE  Final      Susceptibility   Klebsiella pneumoniae - MIC*    AMPICILLIN >=32 RESISTANT Resistant     CEFAZOLIN <=4 SENSITIVE Sensitive     CEFEPIME <=0.12 SENSITIVE Sensitive     CEFTAZIDIME <=1 SENSITIVE Sensitive     CEFTRIAXONE <=0.25 SENSITIVE Sensitive     CIPROFLOXACIN <=0.25 SENSITIVE Sensitive  GENTAMICIN <=1 SENSITIVE Sensitive     IMIPENEM <=0.25 SENSITIVE Sensitive     TRIMETH/SULFA <=20 SENSITIVE Sensitive     AMPICILLIN/SULBACTAM 4 SENSITIVE Sensitive     PIP/TAZO <=4 SENSITIVE Sensitive     * FEW KLEBSIELLA PNEUMONIAE   Serratia marcescens - MIC*    CEFAZOLIN >=64 RESISTANT Resistant     CEFEPIME <=0.12 SENSITIVE Sensitive     CEFTAZIDIME <=1 SENSITIVE Sensitive     CEFTRIAXONE <=0.25 SENSITIVE Sensitive     CIPROFLOXACIN <=0.25 SENSITIVE Sensitive     GENTAMICIN <=1 SENSITIVE Sensitive     TRIMETH/SULFA <=20 SENSITIVE Sensitive     * FEW SERRATIA MARCESCENS     Studies: DG Abd Portable 1V  Result Date: 01/28/2021 CLINICAL DATA:  Constipation EXAM: PORTABLE ABDOMEN - 1 VIEW COMPARISON:  01/26/2021 FINDINGS: Bowel gas is seen throughout the colon as well as small bowel loops without distension.  No significant stool burden. Gastrostomy tube is present. IMPRESSION: Unremarkable bowel gas pattern.  No significant stool burden. Electronically Signed   By: Macy Mis M.D.   On: 01/28/2021 08:32    Scheduled Meds:  bethanechol  25 mg Per Tube TID   carvedilol  25 mg Per Tube BID WC   chlorhexidine gluconate (MEDLINE KIT)  15 mL Mouth Rinse BID   Chlorhexidine Gluconate Cloth  6 each Topical Q0600   cloNIDine  0.1 mg Per Tube Q8H   feeding supplement (PROSource TF)  45 mL Per Tube Daily   fiber  1 packet Per Tube BID   free water  100 mL Per Tube Q4H   heparin injection (subcutaneous)  5,000 Units Subcutaneous Q8H   hydrALAZINE  25 mg Per Tube Q8H   insulin aspart  0-20 Units Subcutaneous Q4H   insulin detemir  20 Units Subcutaneous BID   mouth rinse  15 mL Mouth Rinse 10 times per day   pantoprazole sodium  40 mg Per Tube Daily   polyethylene glycol  17 g Per Tube Daily   sulfamethoxazole-trimethoprim  1 tablet Per Tube Q12H   Continuous Infusions:  feeding supplement (GLUCERNA 1.2 CAL) 1,000 mL (01/28/21 0520)    Principal Problem:   ICH (intracerebral hemorrhage) ([place]) Active Problems:   Malignant hypertension   Diabetes mellitus type 2 in obese ([place])   Acute respiratory failure ([place])   Tracheostomy dependence ([place])   Pneumonia due to Serratia marcescens ([place])   MSSA (methicillin susceptible Staphylococcus aureus) pneumonia ([place])   Stage 3b chronic kidney disease (CKD) ([place])   Acute kidney injury ([place])   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 ([place])   Acute hypernatremia   Dysphagia due to recent stroke   Acute urinary retention   Respiration abnormal   Pressure injury of skin   Abdominal distention   Intractable hiccups   Consultants: Neurology PCCM Medicine  Procedures: Echocardiogram EEG Cortrack PEG tube placement Tracheostomy tube placement  Antibiotics: Zosyn 12/15 through 12/21 Cefepime 12/24 through  12/27 Bactrim 12/27 >>   Time spent: 45 minutes    Erin Hearing ANP  [facility] 7 am - 330 pm/M-F for direct patient care and secure chat Please refer to Amion for contact info 30  days

## 2021-01-29 NOTE — Plan of Care (Signed)
?  Problem: Education: ?Goal: Knowledge of General Education information will improve ?Description: Including pain rating scale, medication(s)/side effects and non-pharmacologic comfort measures ?Outcome: Progressing ?  ?Problem: Health Behavior/Discharge Planning: ?Goal: Ability to manage health-related needs will improve ?Outcome: Progressing ?  ?Problem: Clinical Measurements: ?Goal: Ability to maintain clinical measurements within normal limits will improve ?Outcome: Progressing ?Goal: Will remain free from infection ?Outcome: Progressing ?Goal: Diagnostic test results will improve ?Outcome: Progressing ?Goal: Respiratory complications will improve ?Outcome: Progressing ?Goal: Cardiovascular complication will be avoided ?Outcome: Progressing ?  ?Problem: Activity: ?Goal: Risk for activity intolerance will decrease ?Outcome: Progressing ?  ?Problem: Nutrition: ?Goal: Adequate nutrition will be maintained ?Outcome: Progressing ?  ?Problem: Coping: ?Goal: Level of anxiety will decrease ?Outcome: Progressing ?  ?Problem: Elimination: ?Goal: Will not experience complications related to bowel motility ?Outcome: Progressing ?Goal: Will not experience complications related to urinary retention ?Outcome: Progressing ?  ?Problem: Pain Managment: ?Goal: General experience of comfort will improve ?Outcome: Progressing ?  ?Problem: Safety: ?Goal: Ability to remain free from injury will improve ?Outcome: Progressing ?  ?Problem: Skin Integrity: ?Goal: Risk for impaired skin integrity will decrease ?Outcome: Progressing ?  ?Problem: Education: ?Goal: Knowledge of disease or condition will improve ?Outcome: Progressing ?Goal: Knowledge of secondary prevention will improve (SELECT ALL) ?Outcome: Progressing ?Goal: Knowledge of patient specific risk factors will improve (INDIVIDUALIZE FOR PATIENT) ?Outcome: Progressing ?Goal: Individualized Educational Video(s) ?Outcome: Progressing ?  ?Problem: Coping: ?Goal: Will verbalize  positive feelings about self ?Outcome: Progressing ?Goal: Will identify appropriate support needs ?Outcome: Progressing ?  ?Problem: Health Behavior/Discharge Planning: ?Goal: Ability to manage health-related needs will improve ?Outcome: Progressing ?  ?Problem: Self-Care: ?Goal: Ability to participate in self-care as condition permits will improve ?Outcome: Progressing ?Goal: Verbalization of feelings and concerns over difficulty with self-care will improve ?Outcome: Progressing ?Goal: Ability to communicate needs accurately will improve ?Outcome: Progressing ?  ?Problem: Nutrition: ?Goal: Risk of aspiration will decrease ?Outcome: Progressing ?Goal: Dietary intake will improve ?Outcome: Progressing ?  ?Problem: Intracerebral Hemorrhage Tissue Perfusion: ?Goal: Complications of Intracerebral Hemorrhage will be minimized ?Outcome: Progressing ?  ?

## 2021-01-30 ENCOUNTER — Inpatient Hospital Stay (HOSPITAL_COMMUNITY): Payer: 59

## 2021-01-30 DIAGNOSIS — I611 Nontraumatic intracerebral hemorrhage in hemisphere, cortical: Secondary | ICD-10-CM | POA: Diagnosis not present

## 2021-01-30 DIAGNOSIS — I610 Nontraumatic intracerebral hemorrhage in hemisphere, subcortical: Secondary | ICD-10-CM | POA: Diagnosis not present

## 2021-01-30 DIAGNOSIS — N179 Acute kidney failure, unspecified: Secondary | ICD-10-CM | POA: Diagnosis not present

## 2021-01-30 DIAGNOSIS — R5081 Fever presenting with conditions classified elsewhere: Secondary | ICD-10-CM

## 2021-01-30 DIAGNOSIS — I6320 Cerebral infarction due to unspecified occlusion or stenosis of unspecified precerebral arteries: Secondary | ICD-10-CM | POA: Diagnosis not present

## 2021-01-30 DIAGNOSIS — R509 Fever, unspecified: Secondary | ICD-10-CM | POA: Diagnosis present

## 2021-01-30 DIAGNOSIS — R338 Other retention of urine: Secondary | ICD-10-CM | POA: Diagnosis not present

## 2021-01-30 LAB — URINE CULTURE: Culture: NO GROWTH

## 2021-01-30 LAB — GLUCOSE, CAPILLARY
Glucose-Capillary: 149 mg/dL — ABNORMAL HIGH (ref 70–99)
Glucose-Capillary: 168 mg/dL — ABNORMAL HIGH (ref 70–99)
Glucose-Capillary: 140 mg/dL — ABNORMAL HIGH (ref 70–99)
Glucose-Capillary: 152 mg/dL — ABNORMAL HIGH (ref 70–99)
Glucose-Capillary: 111 mg/dL — ABNORMAL HIGH (ref 70–99)
Glucose-Capillary: 133 mg/dL — ABNORMAL HIGH (ref 70–99)

## 2021-01-30 MED ORDER — SODIUM CHLORIDE 0.9 % IV SOLN
2.0000 g | Freq: Two times a day (BID) | INTRAVENOUS | Status: DC
  Administered 2021-01-30 – 2021-02-03 (×9): 2 g via INTRAVENOUS
  Filled 2021-01-30 (×9): qty 2

## 2021-01-30 NOTE — Progress Notes (Signed)
Pharmacy Antibiotic Note  Stephen Delgado is a 55 y.o. male admitted on 12/30/2020 with ICH likely 2/2 to htn emergency and now concern for hospital acquired pneumonia. Extended hospitalization complicated by ICU admission with new Trach (12/13) and PEG (12/26). WBC 12.7 and afebrile. Pharmacy has been consulted for cefepime dosing.  Plan: Cefepime 2g q12h F/u renal function, length of therapy and culture results  Height: 6' (182.9 cm) Weight: 88.9 kg (195 lb 15.8 oz) IBW/kg (Calculated) : 77.6  Temp (24hrs), Avg:99.4 F (37.4 C), Min:98.6 F (37 C), Max:100.5 F (38.1 C)  Recent Labs  Lab 01/24/21 1027 01/25/21 0836 01/26/21 0107 01/29/21 1009  WBC 12.7* 11.9* 11.4* 8.4  CREATININE 1.36* 1.28* 1.34* 1.57*    Estimated Creatinine Clearance: 57 mL/min (A) (by C-G formula based on SCr of 1.57 mg/dL (H)).    No Known Allergies  Antimicrobials this admission: Zosyn 12/3 > 12/5, 12/15 > 12/21 Cefazolin 12/5 > 12/10 Cefepime 12/24 >12/27, 12/30> Bactrim 12/27> 12/29  Microbiology results: 12/24 Resp Cx: serratia marcescens (R. Cefazolin), K. Pneumoniae (R amp)  12/15 Resp cx: serratia marcescens (R. Cefazolin), s. Aureus (R erythromycin, clinda)  12/3 Resp cx: S. Aureus (R erythromycin, clinda)  12/14 Bcx: negative 12/24 Bcx: negative 12/29 Bcx: sent 12/29 Ucx: sent   Thank you for allowing pharmacy to participate in this patient's care.  Marja Kays, PharmD PGY1 Acute Care Resident  01/30/2021,9:02 AM

## 2021-01-30 NOTE — Progress Notes (Signed)
NAME:  Stephen Delgado, MRN:  [id number], DOB:  [birth year], LOS: 49 ADMISSION DATE:  12/30/2020, CONSULTATION DATE:  12/19 REFERRING MD:  Erlinda Hong, CHIEF COMPLAINT:  Dyspnea   History of Present Illness:  55 yo male presented with headache, nausea and altered mental status.  CT head showed acute Lt ICH with IVH likely related to hypertensive emergency (BP 195/116).  Had persistent hypertension and concern for airway protection, and PCCM asked to assist with ICU management.  Tracheostomy 12/13.  Moved out of ICU.   Pertinent  Medical History  HTN, DM type 2, CKD 3a, OSA  Significant Hospital Events: Including procedures, antibiotic start and stop dates in addition to other pertinent events   11/29 Admit, start cleviprex 12/01 PCCM consulted; changed from cleviprex to cardene due to elevated triglycerides 12/03 intubated; episode of vomiting >> tube feeds held 12/04 resume trickle tube feeds 12/06 remains minimally responsive. Tolerating SBT.  12/12 no acute events overnight, T-max 102 point 12/13 bedside trach planned midmorning  12/14 ATC. Lasix --> 5L UOP  12/15 cont on trach collar. Na to 154 from 145. Adding low rate d5  12/16 scheduled for PEG. Cont D5. Continues on trach collar Out of ICU 12/19 12/23 Cheyne-Stokes respirations noted and was transferred to ICU for short-term ventilation.  12/25 Returned to floor  Imaging/Studies: CT head 11/29 >> large amount of IVH involving lateral/3rd/4th ventricles, ICH in Lt BG, chronic microvascular ischemic changes Echo 11/30 >> EF 50 to 55%, mod LVH, grade 1 DD, ascending aorta 37 mm EEG 12/01 >> continuous generalized slowing MRI brain 12/03 >> numerous small acute infarcts in b/l cerebral hemispheres, Lt caudate hemorrhage, IVH, scattered sulcal SAH, mild communicating hydrocephalus CT head 12/23 Expected evolution of strokes, no shift or herniation   Interim History / Subjective:   Unresponsive On 5L Worsening thick  secretions  Objective   Blood pressure (!) 142/85, pulse 91, temperature 99.2 F (37.3 C), temperature source Oral, resp. rate (!) 22, height 6' (1.829 m), weight 88.9 kg, SpO2 97 %.    FiO2 (%):  [21 %] 21 %   Intake/Output Summary (Last 24 hours) at 01/30/2021 0716 Last data filed at 01/30/2021 0536 Gross per 24 hour  Intake --  Output 2325 ml  Net -2325 ml   Filed Weights   01/28/21 0408 01/29/21 0500 01/30/21 0500  Weight: 87.7 kg 87.5 kg 88.9 kg   Physical Exam: General: Unresponsive, chronically ill-appearing, diaphoresis HENT: [place], AT, OP clear, MMM, swollen bottom lip Neck: Trach in place, c/d/i Eyes: EOMI, no scleral icterus Respiratory: Coarse breath sounds bilaterally. No crackles, wheezing or rales Cardiovascular: RRR, -M/R/G, no JVD Extremities:-Edema,-tenderness Neuro: Unresponsive, no withdrawal  Tracheal aspirate 01/24/21 Serratia, klebsiella   Resolved Hospital Problem list     Assessment & Plan:   # Acute basal left ganglia intraparenchymal hemorrhage with intraventricular hemorrhage in the setting of hypertensive emergency # Bilateral multiple ischemic strokes likely embolic in nature # Acute respiratory failure with hypoxemia, resolved # Cheyne stokes vs ataxic breathing  # VAP # Hypertension # Acute kidney injury on chronic kidney disease stage IIIb # Poorly controlled diabetes # Moderate protein calorie malnutrition  - Routine trach care - Maintain size 8 cuffed shiley - Broaden abx for pneumonia - Obtain trach aspirate - Pulmonary hygiene: CPT, albuterol - Consider mucomyst PRN if secretions become difficult to expel - Neurological prognosis is poor, discussed with Dr. Leonie Man previously, spouse  Code Status:  full code Last date of multidisciplinary goals of care  discussion [12/21, palliative care ]  Critical care time: N/A    Discussed plan with primary team and RT Care Time: 35 min  Mechele Collin, M.D. [facility] Pulmonary/Critical  Care Medicine 01/30/2021 7:16 AM   Please see Amion for pager number to reach on-call Pulmonary and Critical Care Team.

## 2021-01-30 NOTE — Progress Notes (Addendum)
[facility] PROGRESS NOTE  Stephen Delgado FBP:[id number] DOB: May 21, 1965 DOA: 12/30/2020 PCP: Patient, [name]   01/29/2021   01/29/2021  Status: Remains inpatient appropriate because:  Unsafe discharge plan.  Patient in persistent vegetative state per stroke needs placement at skilled nursing facility that is trach capable  Barriers to discharge: Social: Current insurance coverage about to expire and he otherwise has no funding for long-term care.  Clarifying regarding application for disability and Medicaid  Clinical: Tracheostomy and PEG dependent with recurrent tracheobronchitis; remains a full code  Level of care:  Med-Surg but transferring back to ICU for mechanical ventilation and stat CT of head due to neurological change as of 12/23   Code Status: Full code Family Communication: Wife at bedside 12/23 DVT prophylaxis: SCDs COVID vaccination status: Unknown  Foley catheter: 31 French inserted on 12/17; changed on 12/24 POA: No  HPI: 55 y.o. male with PMH significant for DM2, HTN, CKD with questionable compliance to medications Patient presented to the ED on 12/30/2020 with complaint of headache, altered mental status   In the ED, he was hypertensive to 238/160, agitated, required restraints CT head showed large amount of intraventricular hemorrhage involving lateral/third/fourth ventricle, ICH and left basal ganglia as well as chronic microvascular ischemic changes. He was started on Cleviprex drip and admitted to neuro ICU   He subsequently had CT head repeated on 11/30, 12/1 and 12/3, all demonstrated unchanged intraparenchymal hemorrhage and intraventricular hemorrhage 12/3, MRI brain from also showed numerous small acute infarcts in both cerebral hemisphere with minimal improvement in the right superior cerebellum 12/3, intubated 12/13, tracheostomy 12/15, wean from vent to trach collar 12/16, PEG tube placement 12/16, repeat MRI showed  an increase in the size and number of acute infarcts in the bilateral hemispheres with increased involvement in the cerebellum bilaterally.   12/18, transferred from neuro service to [facility]  12/21, palliative care consulted.  Family wanted to continue aggressive care as full CODE STATUS. 12/23 developed Cheyne-Stokes respiratory pattern with respiratory alkalosis.  Transferred to ICU for short-term mechanical ventilation and was found to have new tracheobronchitis now on cefepime 12/25 back to progressive floor and to [facility]   His hospital course has been complicated by significant neurological impairment, aspiration events, AKI, hypernatremia, urinary retention.  Subjective: Unresponsive.  Did have brief eye-opening to tactile and verbal stimulation but no eye contact made  Objective: Vitals:   01/30/21 0341 01/30/21 0717  BP: (!) 142/85   Pulse: 91 (!) 3  Resp: (!) 22 (!) 22  Temp: 99.2 F (37.3 C)   SpO2: 97% 93%    Intake/Output Summary (Last 24 hours) at 01/30/2021 0740 Last data filed at 01/30/2021 0536 Gross per 24 hour  Intake --  Output 2325 ml  Net -2325 ml   Filed Weights   01/28/21 0408 01/29/21 0500 01/30/21 0500  Weight: 87.7 kg 87.5 kg 88.9 kg    Exam:  Constitutional: Remains unresponsive, diaphoretic ENT: New finding of swollen bruised lip-not actively bleeding, excessive oral secretions Respiratory: #8.0 Shiley cuffed trach, no further apnea. Bilateral expiratory rhonchi which are less coarse than yesterday-thick tan blood-tinged secretions Cardiovascular: S1-S2, sinus tachycardia, normotensive, no peripheral edema Abdomen: no tenderness, no masses palpated.  Abdomen slightly distended but soft.  PEG tube -hypoactive bowel sounds positive. LBM 12/29-rectal Foley in place with liquid brown drainage Neurologic: Unable to accurately test cranial nerves.  Eyes closed.  No blink reflex when tested.  Briefly opened both eyes to voice and tactile stimulation  but remains  with disconjugate gaze and does not focus eyes to person and room when they are briefly open.  Cheyne-Stokes respiratory pattern today with 9 to 10-second periods of apnea Psychiatric: Unresponsive   Assessment/Plan: Acute problems: Acute intraparenchymal and intraventricular hemorrhage Bilateral multifocal ischemic infarcts -Imaging and admission reviewed ischemic and hemorrhagic stroke likely from combination of uncontrolled hypertension and small vessel disease.  -no improvement in multiple repeat imagings.  Follow-up MRI on 12/16 demonstrated increase in size and number of acute infarcts  -Neurology has signed off as of 12/22 as they have nothing further to offer.  Currently on no blood thinners because of intracranial hemorrhage. -12/23 recent Cheyne-Stokes respiratory pattern combination of underlying neurological compromise as well as acute respiratory infection -Continues to have various neurogenic patterns of breathing alternating between Cheyne-Stokes and [place]-recent CT scan without evidence of shift or herniation.  Monitor for sudden onset severe hypertension and/or bradycardia  Fever -TM 100.5 ax (101.5 PO equivalent) on 12/29-continues to have low-grade fevers in the 99 range -Sepsis WU initiated (CBC (WBC normal), CMET, ESR (78) , CRP (1.8) , PCT (0.15) ,PCXR (Neg) , UA/urine cx, blood cxs remain pending -Repeat labs in a.m. -PCCM contacted by attending who recommended obtaining another respiratory culture with increase in secretion  Swollen lip -concern for unwitnessed seizure -ck EEG consistent with moderate diffuse encephalopathy without evidence of seizures or epileptiform discharges.  CK 176 -Discussed with attending and will consult neurology to evaluate for possible continuous EEG  Acute respiratory failure with hypoxia: 1) recent MSSA and serratia pneumonia  2) acute Klebsiella and Serratia tracheobronchitis -Completed Zosyn on 12/21 -Transferred to ICU on 12/23  as noted above and was found to have acute tracheobronchitis.  Required short-term mechanical ventilation but now back on trach collar -Recent sputum culture with Klebsiella resistant to ampicillin and Serratia resistant to cefazolin.  12/28 After consultation with pharmacist de-escalated to Bactrim but given recurrent fevers and increased and pulmonary secretions pulmonary medicine recommended changing to Cefepime and obtaining sputum culture  Essential hypertension/EKG with LVH strain -Blood pressure on admission was elevated to over 630 systolic. -Currently controlled on max dose Coreg and clonidine -Add hydralazine per tube given inadequately controlled diastolic hypertension and known LVH -Has IV Apresoline and Normodyne available prn  Intractable hiccups -Thorazine 50 mg IM x1 dose with resolution of sx's  Mild abdominal distention/?  Constipation -Recent imaging unremarkable -Continue scheduled MiraLAX   AKI on CKD 3b -Baseline renal function: January 2022 BUN was 18 and creatinine 1.49 -Current creatinine 1.57 with a BUN of 39   Hypernatremia Resolved -Na+ peak 154 with current reading 138  Non obstructive transaminitis -Recurrent issue throughout hospitalization -Peak AST 163 and ALT 133 on 12/14.  On 12/16 had decreased to 58 and 76. -Today on 12/29 AST 43 and ALT 79 -CT abdomen and pelvis 12/23 unremarkable no evidence of cirrhosis or other biliary tree issues -Follow closely on Tylenol; carvedilol can cause ALT or AST elevation as well -Hep C antibody within normal limit -12/29 hepatitis panel unremarkable   Type 2 diabetes mellitus -A1c 6.3 in 12/31/2020 -Continue Levemir 20 units BID along with SSI every 4 hours   Protein calorie malnutrition/dysphagia Nutrition Problem: Inadequate oral intake Etiology: lethargy/confusion, dysphagia Signs/Symptoms: NPO status Interventions: Tube feeding via PEG Body mass index is 26.58 kg/m.    Acute urinary retention   -Continue Urecholine -Currently has Foley catheter.  Last exchanged on 12/23 -Once has recovered from current episode of tracheobronchitis can begin Foley clamping  trials to see if can remove Foley   Goals of care -Poor prognosis.  No neurological recovery in last several days.  In fact repeat MRI on 12/16 shows worsening stroke compared to scan on admission.  -Multiple discussions held with family per pulmonary medicine team as well as palliative medicine. They want to continue aggressive care with FULL CODE STATUS at this time.         Data Reviewed: Basic Metabolic Panel: Recent Labs  Lab 01/24/21 1027 01/25/21 0836 01/26/21 0107 01/29/21 1009  NA 140 139 140 138  K 4.3 4.1 4.0 4.1  CL 112* 111 112* 109  CO2 20* 20* 19* 20*  GLUCOSE 141* 161* 125* 153*  BUN 37* 35* 36* 39*  CREATININE 1.36* 1.28* 1.34* 1.57*  CALCIUM 8.8* 8.8* 8.8* 9.1    CBC: Recent Labs  Lab 01/24/21 1027 01/25/21 0836 01/26/21 0107 01/29/21 1009  WBC 12.7* 11.9* 11.4* 8.4  NEUTROABS  --   --   --  6.0  HGB 9.7* 9.6* 9.3* 10.2*  HCT 30.8* 29.7* 28.9* 31.9*  MCV 75.7* 74.4* 75.1* 74.9*  PLT 279 253 234 299     CBG: Recent Labs  Lab 01/29/21 1046 01/29/21 1739 01/29/21 1959 01/29/21 2343 01/30/21 0342  GLUCAP 156* 157* 140* 156* 149*    Recent Results (from the past 240 hour(s))  Culture, blood (routine x 2)     Status: None   Collection Time: 01/24/21 10:27 AM   Specimen: BLOOD  Result Value Ref Range Status   Specimen Description BLOOD SITE NOT SPECIFIED  Final   Special Requests   Final    BOTTLES DRAWN AEROBIC ONLY Blood Culture results may not be optimal due to an inadequate volume of blood received in culture bottles   Culture   Final    NO GROWTH 5 DAYS Performed at [facility] [address] [place], [place] [address]    Report Status 01/29/2021 FINAL  Final  Culture, blood (routine x 2)     Status: None   Collection Time: 01/24/21 10:27 AM   Specimen:  BLOOD  Result Value Ref Range Status   Specimen Description BLOOD SITE NOT SPECIFIED  Final   Special Requests   Final    BOTTLES DRAWN AEROBIC ONLY Blood Culture results may not be optimal due to an inadequate volume of blood received in culture bottles   Culture   Final    NO GROWTH 5 DAYS Performed at [facility], [place] [address] [place], [place] [address]    Report Status 01/29/2021 FINAL  Final  Culture, Respiratory w Gram Stain     Status: None   Collection Time: 01/24/21 10:54 AM   Specimen: Tracheal Aspirate  Result Value Ref Range Status   Specimen Description TRACHEAL ASPIRATE  Final   Special Requests NONE  Final   Gram Stain   Final    MODERATE WBC PRESENT,BOTH PMN AND MONONUCLEAR RARE GRAM POSITIVE COCCI RARE GRAM NEGATIVE RODS Performed at [facility], [place] [address], [place], [place] [address]    Culture   Final    FEW SERRATIA MARCESCENS FEW KLEBSIELLA PNEUMONIAE    Report Status 01/26/2021 FINAL  Final   Organism ID, Bacteria SERRATIA MARCESCENS  Final   Organism ID, Bacteria KLEBSIELLA PNEUMONIAE  Final      Susceptibility   Klebsiella pneumoniae - MIC*    AMPICILLIN >=32 RESISTANT Resistant     CEFAZOLIN <=4 SENSITIVE Sensitive  CEFEPIME <=0.12 SENSITIVE Sensitive     CEFTAZIDIME <=1 SENSITIVE Sensitive     CEFTRIAXONE <=0.25 SENSITIVE Sensitive     CIPROFLOXACIN <=0.25 SENSITIVE Sensitive     GENTAMICIN <=1 SENSITIVE Sensitive     IMIPENEM <=0.25 SENSITIVE Sensitive     TRIMETH/SULFA <=20 SENSITIVE Sensitive     AMPICILLIN/SULBACTAM 4 SENSITIVE Sensitive     PIP/TAZO <=4 SENSITIVE Sensitive     * FEW KLEBSIELLA PNEUMONIAE   Serratia marcescens - MIC*    CEFAZOLIN >=64 RESISTANT Resistant     CEFEPIME <=0.12 SENSITIVE Sensitive     CEFTAZIDIME <=1 SENSITIVE Sensitive     CEFTRIAXONE <=0.25 SENSITIVE Sensitive     CIPROFLOXACIN <=0.25 SENSITIVE Sensitive     GENTAMICIN <=1 SENSITIVE Sensitive     TRIMETH/SULFA <=20 SENSITIVE  Sensitive     * FEW SERRATIA MARCESCENS     Studies: DG CHEST PORT 1 VIEW  Result Date: 01/29/2021 CLINICAL DATA:  Fever. EXAM: PORTABLE CHEST 1 VIEW COMPARISON:  Radiographs 01/14/2021 and 01/13/2021. FINDINGS: 0956 hours. The tracheostomy appears unchanged. The feeding tube has been removed in the interval. The heart remains mildly enlarged. Mild left basilar airspace opacity appears unchanged, probably atelectasis. There is no consolidation, pneumothorax or significant pleural effusion. The bones appear stable. Multiple telemetry leads overlie the chest. IMPRESSION: Stable mild left basilar opacity, favoring atelectasis. No consolidation or significant pleural effusion. Electronically Signed   By: Richardean Sale M.D.   On: 01/29/2021 12:13   DG Abd Portable 1V  Result Date: 01/28/2021 CLINICAL DATA:  Constipation EXAM: PORTABLE ABDOMEN - 1 VIEW COMPARISON:  01/26/2021 FINDINGS: Bowel gas is seen throughout the colon as well as small bowel loops without distension. No significant stool burden. Gastrostomy tube is present. IMPRESSION: Unremarkable bowel gas pattern.  No significant stool burden. Electronically Signed   By: Macy Mis M.D.   On: 01/28/2021 08:32   EEG adult  Result Date: 01/29/2021 Lora Havens, MD     01/29/2021 12:03 PM Patient Name: BETTY BROOKS MRN: [id number] Epilepsy Attending: Lora Havens Referring Physician/Provider: Samella Parr, NP Date: 01/29/2021 Duration: 21.26 mins Patient history: 55 year old male with ICH and IVH.  Noted to have a swollen lip with concern for unwitnessed seizure.  EEG to evaluate for seizure.  Level of alertness:  lethargic  AEDs during EEG study: None  Technical aspects: This EEG study was done with scalp electrodes positioned according to the 10-20 International system of electrode placement. Electrical activity was acquired at a sampling rate of '500Hz'  and reviewed with a high frequency filter of '70Hz'  and a low frequency filter  of '1Hz' . EEG data were recorded continuously and digitally stored.  Description: EEG showed continuous generalized 3 to 6 Hz theta-delta slowing.  Hyperventilation and photic stimulation were not performed.     ABNORMALITY - Continuous slow, generalized  IMPRESSION: This study is suggestive of moderate diffuse encephalopathy, nonspecific etiology. No seizures or epileptiform discharges were seen throughout the recording.  Priyanka Barbra Sarks    Scheduled Meds:  bethanechol  25 mg Per Tube TID   carvedilol  25 mg Per Tube BID WC   chlorhexidine gluconate (MEDLINE KIT)  15 mL Mouth Rinse BID   Chlorhexidine Gluconate Cloth  6 each Topical Q0600   cloNIDine  0.1 mg Per Tube Q8H   feeding supplement (PROSource TF)  45 mL Per Tube Daily   fiber  1 packet Per Tube BID   free water  100 mL Per Tube Q4H  heparin injection (subcutaneous)  5,000 Units Subcutaneous Q8H   hydrALAZINE  25 mg Per Tube Q8H   insulin aspart  0-20 Units Subcutaneous Q4H   insulin detemir  20 Units Subcutaneous BID   mouth rinse  15 mL Mouth Rinse 10 times per day   pantoprazole sodium  40 mg Per Tube Daily   polyethylene glycol  17 g Per Tube Daily   scopolamine  1 patch Transdermal Q72H   sulfamethoxazole-trimethoprim  1 tablet Per Tube Q12H   Continuous Infusions:  feeding supplement (GLUCERNA 1.2 CAL) 1,000 mL (01/30/21 0528)    Principal Problem:   ICH (intracerebral hemorrhage) ([place]) Active Problems:   Malignant hypertension   Diabetes mellitus type 2 in obese ([place])   Acute respiratory failure ([place])   Tracheostomy dependence ([place])   Pneumonia due to Serratia marcescens ([place])   MSSA (methicillin susceptible Staphylococcus aureus) pneumonia ([place])   Stage 3b chronic kidney disease (CKD) ([place])   Acute kidney injury ([place])   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 ([place])   Acute hypernatremia   Dysphagia due to recent stroke   Acute urinary retention   Respiration abnormal   Pressure  injury of skin   Abdominal distention   Intractable hiccups   Pneumonia due to Klebsiella pneumoniae ([place])   Swollen lip   Consultants: Neurology PCCM Medicine  Procedures: Echocardiogram EEG Cortrack PEG tube placement Tracheostomy tube placement  Antibiotics: Zosyn 12/15 through 12/21 Cefepime 12/24 through 12/27 Bactrim 12/27 >>   Time spent: 45 minutes    Erin Hearing ANP  [facility] 7 am - 330 pm/M-F for direct patient care and secure chat Please refer to Amion for contact info 31  days

## 2021-01-30 NOTE — Plan of Care (Signed)
?  Problem: Education: ?Goal: Knowledge of General Education information will improve ?Description: Including pain rating scale, medication(s)/side effects and non-pharmacologic comfort measures ?Outcome: Progressing ?  ?Problem: Health Behavior/Discharge Planning: ?Goal: Ability to manage health-related needs will improve ?Outcome: Progressing ?  ?Problem: Clinical Measurements: ?Goal: Ability to maintain clinical measurements within normal limits will improve ?Outcome: Progressing ?Goal: Will remain free from infection ?Outcome: Progressing ?Goal: Diagnostic test results will improve ?Outcome: Progressing ?Goal: Respiratory complications will improve ?Outcome: Progressing ?Goal: Cardiovascular complication will be avoided ?Outcome: Progressing ?  ?Problem: Activity: ?Goal: Risk for activity intolerance will decrease ?Outcome: Progressing ?  ?Problem: Nutrition: ?Goal: Adequate nutrition will be maintained ?Outcome: Progressing ?  ?Problem: Coping: ?Goal: Level of anxiety will decrease ?Outcome: Progressing ?  ?Problem: Elimination: ?Goal: Will not experience complications related to bowel motility ?Outcome: Progressing ?Goal: Will not experience complications related to urinary retention ?Outcome: Progressing ?  ?Problem: Pain Managment: ?Goal: General experience of comfort will improve ?Outcome: Progressing ?  ?Problem: Safety: ?Goal: Ability to remain free from injury will improve ?Outcome: Progressing ?  ?Problem: Skin Integrity: ?Goal: Risk for impaired skin integrity will decrease ?Outcome: Progressing ?  ?Problem: Education: ?Goal: Knowledge of disease or condition will improve ?Outcome: Progressing ?Goal: Knowledge of secondary prevention will improve (SELECT ALL) ?Outcome: Progressing ?Goal: Knowledge of patient specific risk factors will improve (INDIVIDUALIZE FOR PATIENT) ?Outcome: Progressing ?Goal: Individualized Educational Video(s) ?Outcome: Progressing ?  ?Problem: Coping: ?Goal: Will verbalize  positive feelings about self ?Outcome: Progressing ?Goal: Will identify appropriate support needs ?Outcome: Progressing ?  ?Problem: Health Behavior/Discharge Planning: ?Goal: Ability to manage health-related needs will improve ?Outcome: Progressing ?  ?Problem: Self-Care: ?Goal: Ability to participate in self-care as condition permits will improve ?Outcome: Progressing ?Goal: Verbalization of feelings and concerns over difficulty with self-care will improve ?Outcome: Progressing ?Goal: Ability to communicate needs accurately will improve ?Outcome: Progressing ?  ?Problem: Nutrition: ?Goal: Risk of aspiration will decrease ?Outcome: Progressing ?Goal: Dietary intake will improve ?Outcome: Progressing ?  ?Problem: Intracerebral Hemorrhage Tissue Perfusion: ?Goal: Complications of Intracerebral Hemorrhage will be minimized ?Outcome: Progressing ?  ?

## 2021-01-31 DIAGNOSIS — R569 Unspecified convulsions: Secondary | ICD-10-CM | POA: Diagnosis not present

## 2021-01-31 DIAGNOSIS — E870 Hyperosmolality and hypernatremia: Secondary | ICD-10-CM | POA: Diagnosis not present

## 2021-01-31 DIAGNOSIS — N179 Acute kidney failure, unspecified: Secondary | ICD-10-CM | POA: Diagnosis not present

## 2021-01-31 DIAGNOSIS — R220 Localized swelling, mass and lump, head: Secondary | ICD-10-CM | POA: Diagnosis not present

## 2021-01-31 DIAGNOSIS — R140 Abdominal distension (gaseous): Secondary | ICD-10-CM | POA: Diagnosis not present

## 2021-01-31 LAB — COMPREHENSIVE METABOLIC PANEL
ALT: 76 U/L — ABNORMAL HIGH (ref 0–44)
AST: 40 U/L (ref 15–41)
Albumin: 2 g/dL — ABNORMAL LOW (ref 3.5–5.0)
Alkaline Phosphatase: 54 U/L (ref 38–126)
Anion gap: 9 (ref 5–15)
BUN: 40 mg/dL — ABNORMAL HIGH (ref 6–20)
CO2: 21 mmol/L — ABNORMAL LOW (ref 22–32)
Calcium: 8.8 mg/dL — ABNORMAL LOW (ref 8.9–10.3)
Chloride: 105 mmol/L (ref 98–111)
Creatinine, Ser: 1.52 mg/dL — ABNORMAL HIGH (ref 0.61–1.24)
GFR, Estimated: 53 mL/min — ABNORMAL LOW (ref >60)
Glucose, Bld: 139 mg/dL — ABNORMAL HIGH (ref 70–99)
Potassium: 4.1 mmol/L (ref 3.5–5.1)
Sodium: 135 mmol/L (ref 135–145)
Total Bilirubin: 0.4 mg/dL (ref 0.3–1.2)
Total Protein: 6.4 g/dL — ABNORMAL LOW (ref 6.5–8.1)

## 2021-01-31 LAB — CBC WITH DIFFERENTIAL/PLATELET
Abs Immature Granulocytes: 0.19 10*3/uL — ABNORMAL HIGH (ref 0.00–0.07)
Basophils Absolute: 0 10*3/uL (ref 0.0–0.1)
Basophils Relative: 0 %
Eosinophils Absolute: 0.5 10*3/uL (ref 0.0–0.5)
Eosinophils Relative: 6 %
HCT: 31.2 % — ABNORMAL LOW (ref 39.0–52.0)
Hemoglobin: 9.9 g/dL — ABNORMAL LOW (ref 13.0–17.0)
Immature Granulocytes: 2 %
Lymphocytes Relative: 19 %
Lymphs Abs: 1.7 10*3/uL (ref 0.7–4.0)
MCH: 23.8 pg — ABNORMAL LOW (ref 26.0–34.0)
MCHC: 31.7 g/dL (ref 30.0–36.0)
MCV: 75 fL — ABNORMAL LOW (ref 80.0–100.0)
Monocytes Absolute: 0.7 10*3/uL (ref 0.1–1.0)
Monocytes Relative: 8 %
Neutro Abs: 5.7 10*3/uL (ref 1.7–7.7)
Neutrophils Relative %: 65 %
Platelets: 275 10*3/uL (ref 150–400)
RBC: 4.16 MIL/uL — ABNORMAL LOW (ref 4.22–5.81)
RDW: 15.6 % — ABNORMAL HIGH (ref 11.5–15.5)
WBC: 8.9 10*3/uL (ref 4.0–10.5)
nRBC: 0 % (ref 0.0–0.2)

## 2021-01-31 LAB — GLUCOSE, CAPILLARY
Glucose-Capillary: 134 mg/dL — ABNORMAL HIGH (ref 70–99)
Glucose-Capillary: 142 mg/dL — ABNORMAL HIGH (ref 70–99)
Glucose-Capillary: 125 mg/dL — ABNORMAL HIGH (ref 70–99)
Glucose-Capillary: 145 mg/dL — ABNORMAL HIGH (ref 70–99)
Glucose-Capillary: 130 mg/dL — ABNORMAL HIGH (ref 70–99)
Glucose-Capillary: 138 mg/dL — ABNORMAL HIGH (ref 70–99)

## 2021-01-31 NOTE — Progress Notes (Signed)
LTM EEG hooked up and running - no initial skin breakdown - push button tested - neuro notified. [facility] monitoring.  

## 2021-01-31 NOTE — Progress Notes (Signed)
EEG discontinued at bedside. No skin breakdown noted. Results pending. °

## 2021-01-31 NOTE — Procedures (Addendum)
Patient Name: TERELL KINCY  MRN: [id number]  Epilepsy Attending: Charlsie Quest  Referring Physician/Provider: Dr Caryl Pina Duration: 01/31/2021  0035 to 01/31/2021 1045   Patient history: 55 year old male with ICH and IVH.  Noted to have a swollen lip with concern for unwitnessed seizure.  EEG to evaluate for seizure.   Level of alertness:  lethargic    AEDs during EEG study: None   Technical aspects: This EEG study was done with scalp electrodes positioned according to the 10-20 International system of electrode placement. Electrical activity was acquired at a sampling rate of 500Hz  and reviewed with a high frequency filter of 70Hz  and a low frequency filter of 1Hz . EEG data were recorded continuously and digitally stored.    Description: EEG showed continuous generalized 3 to 6 Hz theta-delta slowing.  Hyperventilation and photic stimulation were not performed.     EKG artifact was seen throughout the study.     ABNORMALITY - Continuous slow, generalized   IMPRESSION: This study is suggestive of moderate diffuse encephalopathy, nonspecific etiology. No seizures or epileptiform discharges were seen throughout the recording.   Mekhi Lascola 

## 2021-01-31 NOTE — Progress Notes (Signed)
Neurology Progress Note  Brief HPI: 55 y.o. male with a PMHx of HTN, questionable medication compliance, DM, and CKD 3 who initially presented to the ED 11/30 when he was difficult to arouse from sleeping with confusion, headache, and nausea. CTH revealed left caudate/basal ganglia hemorrhage with IVH with hypertension on arrival. MRI on 12/3 noted numerous small acute infarcts in bilateral cerebral hemispheres. Patient has had a poor neurologic examination with a prolonged hospitalization and required tracheostomy placemen and the neurology team signed off without further recommendations. On 12/30, neurology was contacted regarding concern for possible seizure activity and LTM monitoring after patient was found with a swollen and bloody lip noted on 12/29. EEG on 12/29 revealed moderate diffuse encephalopathy without seizures or epileptiform discharges.   Subjective: EEG overnight is suggestive of moderate diffuse encephalopathy, nonspecific etiology. No seizures or epileptiform discharges were seen.   Exam: Vitals:   01/31/21 1210 01/31/21 1221  BP:  (!) 151/94  Pulse: 97 93  Resp: 18 20  Temp:  98.8 F (37.1 C)  SpO2: 97% 100%   Gen: Laying in hospital bed, in no acute distress Resp: tracheostomy in place with trach collar, SpO2 100%  Abd: soft, non-distended  Neuro: Mental Status: Laying in bed, unresponsive. Intermittently opens eyes during the assessment but does not fixate or track. Does not respond to voice or pain.  Cranial Nerves: PERRL, VOR intact, does not cough spontaneously, does not protrude tongue, head is grossly midline.  Motor: BUE flaccid, will intermittently flicker BLE to noxious stimuli.  Sensory: As above.  Gait: Deferred  Pertinent Labs: CBC    Component Value Date/Time   WBC 8.9 01/31/2021 0202   RBC 4.16 (L) 01/31/2021 0202   HGB 9.9 (L) 01/31/2021 0202   HCT 31.2 (L) 01/31/2021 0202   PLT 275 01/31/2021 0202   MCV 75.0 (L) 01/31/2021 0202   MCH 23.8  (L) 01/31/2021 0202   MCHC 31.7 01/31/2021 0202   RDW 15.6 (H) 01/31/2021 0202   LYMPHSABS 1.7 01/31/2021 0202   MONOABS 0.7 01/31/2021 0202   EOSABS 0.5 01/31/2021 0202   BASOSABS 0.0 01/31/2021 0202   CMP     Component Value Date/Time   NA 135 01/31/2021 0202   NA 145 (H) 05/31/2018 1352   K 4.1 01/31/2021 0202   CL 105 01/31/2021 0202   CO2 21 (L) 01/31/2021 0202   GLUCOSE 139 (H) 01/31/2021 0202   BUN 40 (H) 01/31/2021 0202   BUN 22 05/31/2018 1352   CREATININE 1.52 (H) 01/31/2021 0202   CALCIUM 8.8 (L) 01/31/2021 0202   PROT 6.4 (L) 01/31/2021 0202   ALBUMIN 2.0 (L) 01/31/2021 0202   AST 40 01/31/2021 0202   ALT 76 (H) 01/31/2021 0202   ALKPHOS 54 01/31/2021 0202   BILITOT 0.4 01/31/2021 0202   GFRNONAA 53 (L) 01/31/2021 0202   GFRAA 70 05/31/2018 1352   Imaging Reviewed:  EEG 12/31: "This study is suggestive of moderate diffuse encephalopathy, nonspecific etiology. No seizures or epileptiform discharges were seen throughout the recording."  CT head wo contrast 12/23: 1. Expected evolution of the bilateral cerebral white matter and cerebellar infarcts, as well as the left caudate infarct. Resolved left caudate intraparenchymal hemorrhage. 2. Decreased intraventricular hemorrhage with trace residual layering hemorrhage in the left occipital horn. 3. No midline shift or herniation.  Assessment: 55 y.o. male with history as above who has had a prolonged hospitalization following an initial IPH with IVH and multiple small acute bilateral cerebral hemisphere infarctions. Patient has  had ongoing poor neurologic examinations with near vegetative state with a poor prognosis and subsequently required a tracheostomy. Patient was found on 12/29 with a swollen and bloody lip with EEG evidence without epileptiform discharges or seizures and neurology was consulted for seizure evaluation and LTM monitoring.  - Examination is consistent with previous neurology examinations with patient  in a 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 - At this time, there is not enough objective evidence to support seizures or the need for AED initiation with no witnessed seizure and EEG without evidence of epileptiform discharges or seizures.   Recommendations: - Discontinue LTM monitoring  - Will hold on AED initiation at this time, as above - No further neurology recommendations. Please call for further questions or concerns.   Lanae Boast, AGACNP-BC [facility] [phone]

## 2021-01-31 NOTE — Plan of Care (Signed)

## 2021-01-31 NOTE — Progress Notes (Signed)
PROGRESS NOTE  Stephen Delgado BSW:[id number] DOB: 1965/09/04 DOA: 12/30/2020 PCP: Patient, [name]   LOS: 32 days   Brief Narrative / Interim history: 55 y.o. male with PMH significant for DM2, HTN, CKD with questionable compliance to medications, presented to the ED on 12/30/2020 with complaint of headache, altered mental status. In the ED, he was hypertensive to 238/160, agitated, required restraints. CT head showed large amount of intraventricular hemorrhage involving lateral/third/fourth ventricle, ICH and left basal ganglia as well as chronic microvascular ischemic changes. He was started on Cleviprex drip and admitted to neuro ICU. He subsequently had CT head repeated on 11/30, 12/1 and 12/3, all demonstrated unchanged intraparenchymal hemorrhage and intraventricular hemorrhage  Significant events 12/3, MRI brain from also showed numerous small acute infarcts in both cerebral hemisphere with minimal improvement in the right superior cerebellum 12/3, intubated 12/13, tracheostomy 12/15, wean from vent to trach collar 12/16, PEG tube placement 12/16, repeat MRI showed an increase in the size and number of acute infarcts in the bilateral hemispheres with increased involvement in the cerebellum bilaterally.   12/18, transferred from neuro service to [facility]  12/21, palliative care consulted.  Family wanted to continue aggressive care as full CODE STATUS. 12/23 developed Cheyne-Stokes respiratory pattern with respiratory alkalosis.  Transferred to ICU for short-term mechanical ventilation and was found to have new tracheobronchitis now on cefepime 12/25 back to progressive floor and to [facility] 12/29 concern for seizure as his lip appears to be bleeding   His hospital course has been complicated by significant neurological impairment, aspiration events, AKI, hypernatremia, urinary retention.  Subjective / 24h Interval events: unresponsive  Assessment & Plan: Principal  Problem Acute intraparenchymal and intraventricular hemorrhage, bilateral multifocal ischemic infarcts -Imaging and admission reviewed ischemic and hemorrhagic stroke likely from combination of uncontrolled hypertension and small vessel disease. No improvement in multiple repeat imagings.  Follow-up MRI on 12/16 demonstrated increase in size and number of acute infarcts. Neurology has signed off as of 12/22 as they have nothing further to offer.  Currently on no blood thinners because of intracranial hemorrhage.  -12/23 recent Cheyne-Stokes respiratory pattern combination of underlying neurological compromise as well as acute respiratory infection -Continues to have various neurogenic patterns of breathing alternating between Cheyne-Stokes and [place]-recent CT scan without evidence of shift or herniation.  Monitor for sudden onset severe hypertension and/or bradycardia  Active Problems Fever -TM 100.5 ax (101.5 PO equivalent) on 12/29-continues to have low-grade fevers in the 99 range. Sepsis WU initiated (CBC (WBC normal), CMET, ESR (78) , CRP (1.8) , PCT (0.15) ,PCXR (Neg) , UA/urine cx, blood cxs remain pending.  Discussed with PCCM who is seeing patient for a tracheostomy, broaden to cefepime and reculture   Swollen lip -concern for unwitnessed seizure. ck EEG consistent with moderate diffuse encephalopathy without evidence of seizures or epileptiform discharges.  There is still clinical concern for seizure, currently on continuous EEG   Acute respiratory failure with hypoxia, recent MSSA and serratia pneumonia, acute Klebsiella and Serratia tracheobronchitis -Completed Zosyn on 12/21. Transferred to ICU on 12/23 as noted above and was found to have acute tracheobronchitis.  Required short-term mechanical ventilation but now back on trach collar. Recent sputum culture with Klebsiella resistant to ampicillin and Serratia resistant to cefazolin.  Currently on Cefepime   Essential hypertension/EKG  with LVH strain -Blood pressure on admission was elevated to over 466 systolic.   Intractable hiccups -Thorazine 50 mg IM x1 dose with resolution of sx's   Mild abdominal  distention/?  Constipation -Recent imaging unremarkable. Continue scheduled MiraLAX   AKI on CKD 3b -Baseline renal function: January 2022 BUN was 18 and creatinine 1.49.  Currently at baseline   Hypernatremia -Resolved   Non obstructive transaminitis -Recurrent issue throughout hospitalization. CT abdomen and pelvis 12/23 unremarkable no evidence of cirrhosis or other biliary tree issues   Type 2 diabetes mellitus -A1c 6.3 in 12/31/2020  CBG (last 3)  Recent Labs    01/30/21 1945 01/30/21 2309 01/31/21 0302  GLUCAP 111* 133* 134*    Protein calorie malnutrition/dysphagia -Nutrition Problem: Inadequate oral intake. PEG currently   Acute urinary retention  -Continue Urecholine, currently has Foley catheter.  Last exchanged on 12/23   Goals of care -Poor prognosis.  No neurological recovery in last several days.  In fact repeat MRI on 12/16 shows worsening stroke compared to scan on admission. Multiple discussions held with family per pulmonary medicine team as well as palliative medicine. They want to continue aggressive care with FULL CODE STATUS at this time.    Scheduled Meds:  bethanechol  25 mg Per Tube TID   carvedilol  25 mg Per Tube BID WC   chlorhexidine gluconate (MEDLINE KIT)  15 mL Mouth Rinse BID   Chlorhexidine Gluconate Cloth  6 each Topical Q0600   cloNIDine  0.1 mg Per Tube Q8H   feeding supplement (PROSource TF)  45 mL Per Tube Daily   fiber  1 packet Per Tube BID   free water  100 mL Per Tube Q4H   heparin injection (subcutaneous)  5,000 Units Subcutaneous Q8H   hydrALAZINE  25 mg Per Tube Q8H   insulin aspart  0-20 Units Subcutaneous Q4H   insulin detemir  20 Units Subcutaneous BID   mouth rinse  15 mL Mouth Rinse 10 times per day   pantoprazole sodium  40 mg Per Tube Daily    polyethylene glycol  17 g Per Tube Daily   scopolamine  1 patch Transdermal Q72H   Continuous Infusions:  ceFEPime (MAXIPIME) IV 2 g (01/30/21 2251)   feeding supplement (GLUCERNA 1.2 CAL) 1,000 mL (01/30/21 0528)   PRN Meds:.acetaminophen **OR** acetaminophen (TYLENOL) oral liquid 160 mg/5 mL **OR** acetaminophen, albuterol, docusate, hydrALAZINE, influenza vac split quadrivalent PF, labetalol, morphine injection, ondansetron (ZOFRAN) IV  Diet Orders (From admission, onward)     Start     Ordered   01/16/21 1346  Diet NPO time specified  Diet effective midnight       Comments: May have meds via g-tube starting immediately   01/16/21 1346            DVT prophylaxis: heparin injection 5,000 Units Start: 01/05/21 1400 SCD's Start: 12/30/20 1908     Code Status: Full Code  Family Communication: no family at bedside   Status is: Inpatient  Level of care: Med-Surg  Consultants:  PCCM Neurology   Objective: Vitals:   01/31/21 0032 01/31/21 0304 01/31/21 0321 01/31/21 0322  BP: 138/90 (!) 152/98  (!) 152/98  Pulse: 84 88 88 88  Resp: _0 Temp: 98.8 F (37.1 C) 98.9 F (37.2 C)  98.9 F (37.2 C)  TempSrc: Axillary   Axillary  SpO2: 93% 98% 95% 98%  Weight:      Height:        Intake/Output Summary (Last 24 hours) at 01/31/2021 0705 Last data filed at 01/30/2021 1856 Gross per 24 hour  Intake --  Output 1300 ml  Net -1300 ml   Filed  Weights   01/28/21 0408 01/29/21 0500 01/30/21 0500  Weight: 87.7 kg 87.5 kg 88.9 kg    Examination:  Constitutional: NAD, unresponsive Eyes: no scleral icterus ENMT: Mucous membranes are moist.  Neck: normal, supple Respiratory: clear to auscultation bilaterally, no wheezing, no crackles. Normal respiratory effort.  Cardiovascular: Regular rate and rhythm, no murmurs / rubs / gallops. Trace edema Abdomen: non distended, no tenderness. Bowel sounds positive.  Musculoskeletal: no clubbing / cyanosis.  Skin: no  rashes Neurologic: unresponsive  Data Reviewed: I have independently reviewed following labs and imaging studies   CBC: Recent Labs  Lab 01/24/21 1027 01/25/21 0836 01/26/21 0107 01/29/21 1009 01/31/21 0202  WBC 12.7* 11.9* 11.4* 8.4 8.9  NEUTROABS  --   --   --  6.0 5.7  HGB 9.7* 9.6* 9.3* 10.2* 9.9*  HCT 30.8* 29.7* 28.9* 31.9* 31.2*  MCV 75.7* 74.4* 75.1* 74.9* 75.0*  PLT 279 253 234 299 209   Basic Metabolic Panel: Recent Labs  Lab 01/24/21 1027 01/25/21 0836 01/26/21 0107 01/29/21 1009 01/31/21 0202  NA 140 139 140 138 135  K 4.3 4.1 4.0 4.1 4.1  CL 112* 111 112* 109 105  CO2 20* 20* 19* 20* 21*  GLUCOSE 141* 161* 125* 153* 139*  BUN 37* 35* 36* 39* 40*  CREATININE 1.36* 1.28* 1.34* 1.57* 1.52*  CALCIUM 8.8* 8.8* 8.8* 9.1 8.8*   Liver Function Tests: Recent Labs  Lab 01/29/21 1009 01/31/21 0202  AST 43* 40  ALT 79* 76*  ALKPHOS 58 54  BILITOT 0.6 0.4  PROT 6.4* 6.4*  ALBUMIN 2.0* 2.0*   Coagulation Profile: No results for input(s): INR, PROTIME in the last 168 hours. HbA1C: No results for input(s): HGBA1C in the last 72 hours. CBG: Recent Labs  Lab 01/30/21 1208 01/30/21 1651 01/30/21 1945 01/30/21 2309 01/31/21 0302  GLUCAP 140* 152* 111* 133* 134*    Recent Results (from the past 240 hour(s))  Culture, blood (routine x 2)     Status: None   Collection Time: 01/24/21 10:27 AM   Specimen: BLOOD  Result Value Ref Range Status   Specimen Description BLOOD SITE NOT SPECIFIED  Final   Special Requests   Final    BOTTLES DRAWN AEROBIC ONLY Blood Culture results may not be optimal due to an inadequate volume of blood received in culture bottles   Culture   Final    NO GROWTH 5 DAYS Performed at [facility], [place] [address], [place], [place] [address]    Report Status 01/29/2021 FINAL  Final  Culture, blood (routine x 2)     Status: None   Collection Time: 01/24/21 10:27 AM   Specimen: BLOOD  Result Value Ref Range Status   Specimen  Description BLOOD SITE NOT SPECIFIED  Final   Special Requests   Final    BOTTLES DRAWN AEROBIC ONLY Blood Culture results may not be optimal due to an inadequate volume of blood received in culture bottles   Culture   Final    NO GROWTH 5 DAYS Performed at [facility], [place] [address], [place], [place] [address]    Report Status 01/29/2021 FINAL  Final  Culture, Respiratory w Gram Stain     Status: None   Collection Time: 01/24/21 10:54 AM   Specimen: Tracheal Aspirate  Result Value Ref Range Status   Specimen Description TRACHEAL ASPIRATE  Final   Special Requests NONE  Final   Gram Stain   Final  MODERATE WBC PRESENT,BOTH PMN AND MONONUCLEAR RARE GRAM POSITIVE COCCI RARE GRAM NEGATIVE RODS Performed at [facility], [place] [address], [place], [place] [address]    Culture   Final    FEW SERRATIA MARCESCENS FEW KLEBSIELLA PNEUMONIAE    Report Status 01/26/2021 FINAL  Final   Organism ID, Bacteria SERRATIA MARCESCENS  Final   Organism ID, Bacteria KLEBSIELLA PNEUMONIAE  Final      Susceptibility   Klebsiella pneumoniae - MIC*    AMPICILLIN >=32 RESISTANT Resistant     CEFAZOLIN <=4 SENSITIVE Sensitive     CEFEPIME <=0.12 SENSITIVE Sensitive     CEFTAZIDIME <=1 SENSITIVE Sensitive     CEFTRIAXONE <=0.25 SENSITIVE Sensitive     CIPROFLOXACIN <=0.25 SENSITIVE Sensitive     GENTAMICIN <=1 SENSITIVE Sensitive     IMIPENEM <=0.25 SENSITIVE Sensitive     TRIMETH/SULFA <=20 SENSITIVE Sensitive     AMPICILLIN/SULBACTAM 4 SENSITIVE Sensitive     PIP/TAZO <=4 SENSITIVE Sensitive     * FEW KLEBSIELLA PNEUMONIAE   Serratia marcescens - MIC*    CEFAZOLIN >=64 RESISTANT Resistant     CEFEPIME <=0.12 SENSITIVE Sensitive     CEFTAZIDIME <=1 SENSITIVE Sensitive     CEFTRIAXONE <=0.25 SENSITIVE Sensitive     CIPROFLOXACIN <=0.25 SENSITIVE Sensitive     GENTAMICIN <=1 SENSITIVE Sensitive     TRIMETH/SULFA <=20 SENSITIVE Sensitive     * FEW SERRATIA MARCESCENS  Culture, blood  (routine x 2)     Status: None (Preliminary result)   Collection Time: 01/29/21 10:09 AM   Specimen: Left Antecubital; Blood  Result Value Ref Range Status   Specimen Description LEFT ANTECUBITAL  Final   Special Requests   Final    BOTTLES DRAWN AEROBIC AND ANAEROBIC Blood Culture adequate volume   Culture   Final    NO GROWTH < 24 HOURS Performed at [facility], [address] [place], [place] [address]    Report Status PENDING  Incomplete  Culture, blood (routine x 2)     Status: None (Preliminary result)   Collection Time: 01/29/21 10:31 AM   Specimen: BLOOD LEFT HAND  Result Value Ref Range Status   Specimen Description BLOOD LEFT HAND  Final   Special Requests   Final    BOTTLES DRAWN AEROBIC AND ANAEROBIC Blood Culture adequate volume   Culture   Final    NO GROWTH < 24 HOURS Performed at [facility], [place] [address] [place], [place] [address]    Report Status PENDING  Incomplete  Urine Culture     Status: None   Collection Time: 01/29/21  3:56 PM   Specimen: Urine, Catheterized  Result Value Ref Range Status   Specimen Description URINE, CATHETERIZED  Final   Special Requests NONE  Final   Culture   Final    NO GROWTH Performed at [facility], [address] [place], [place] [address]    Report Status 01/30/2021 FINAL  Final  Culture, Respiratory w Gram Stain     Status: None (Preliminary result)   Collection Time: 01/30/21 11:18 AM   Specimen: Tracheal Aspirate; Respiratory  Result Value Ref Range Status   Specimen Description TRACHEAL ASPIRATE  Final   Special Requests NONE  Final   Gram Stain   Final    ABUNDANT WBC PRESENT,BOTH PMN AND MONONUCLEAR MODERATE GRAM POSITIVE COCCI RARE GRAM NEGATIVE RODS RARE GRAM VARIABLE ROD Performed at [facility], [place] [address],  [place], [place] [address]    Culture PENDING  Incomplete   Report Status PENDING  Incomplete     Radiology Studies: No results found.  Marzetta Board, MD,  PhD [facility]  Between 7 am - 7 pm I am available, please contact me via Amion (for emergencies) or Securechat (non urgent messages)  Between 7 pm - 7 am I am not available, please contact night coverage MD/APP via Amion

## 2021-02-01 LAB — GLUCOSE, CAPILLARY
Glucose-Capillary: 126 mg/dL — ABNORMAL HIGH (ref 70–99)
Glucose-Capillary: 129 mg/dL — ABNORMAL HIGH (ref 70–99)
Glucose-Capillary: 133 mg/dL — ABNORMAL HIGH (ref 70–99)
Glucose-Capillary: 136 mg/dL — ABNORMAL HIGH (ref 70–99)
Glucose-Capillary: 149 mg/dL — ABNORMAL HIGH (ref 70–99)
Glucose-Capillary: 153 mg/dL — ABNORMAL HIGH (ref 70–99)

## 2021-02-01 LAB — BLOOD GAS, VENOUS
Acid-Base Excess: 1 mmol/L (ref 0.0–2.0)
Bicarbonate: 25 mmol/L (ref 20.0–28.0)
O2 Saturation: 63.4 %
Patient temperature: 37
pCO2, Ven: 38.9 mmHg — ABNORMAL LOW (ref 44.0–60.0)
pH, Ven: 7.424 (ref 7.250–7.430)
pO2, Ven: 34.7 mmHg (ref 32.0–45.0)

## 2021-02-01 NOTE — Plan of Care (Signed)
  Problem: Clinical Measurements: Goal: Diagnostic test results will improve Outcome: Progressing Goal: Respiratory complications will improve Outcome: Progressing Goal: Cardiovascular complication will be avoided Outcome: Progressing   

## 2021-02-01 NOTE — Progress Notes (Signed)
Patient ID: Stephen Delgado, male   DOB: [birth year], 56 y.o.   MRN: [id number]  PROGRESS NOTE    Stephen Delgado  GNO:[id number] DOB: [birth year] DOA: 12/30/2020 PCP: Patient, [name]   Brief Narrative:  56 y.o. male with PMH significant for DM2, HTN, CKD with questionable compliance to medications, presented to the ED on 12/30/2020 with complaint of headache, altered mental status. In the ED, he was hypertensive to 238/160, agitated, required restraints. CT head showed large amount of intraventricular hemorrhage involving lateral/third/fourth ventricle, ICH and left basal ganglia as well as chronic microvascular ischemic changes. He was started on Cleviprex drip and admitted to neuro ICU. He subsequently had CT head repeated on 11/30, 12/1 and 12/3, all demonstrated unchanged intraparenchymal hemorrhage and intraventricular hemorrhage.  Significant events 12/3, MRI brain from also showed numerous small acute infarcts in both cerebral hemisphere with minimal improvement in the right superior cerebellum 12/3, intubated 12/13, tracheostomy 12/15, wean from vent to trach collar 12/16, PEG tube placement 12/16, repeat MRI showed an increase in the size and number of acute infarcts in the bilateral hemispheres with increased involvement in the cerebellum bilaterally.   12/18, transferred from neuro service to [facility]  12/21, palliative care consulted.  Family wanted to continue aggressive care as full CODE STATUS. 12/23 developed Cheyne-Stokes respiratory pattern with respiratory alkalosis.  Transferred to ICU for short-term mechanical ventilation and was found to have new tracheobronchitis requiring IV antibiotics 12/25 back to progressive floor and to [facility] 12/29 concern for seizure as his lip appeared to be bleeding   His hospital course has been complicated by significant neurological impairment, aspiration events, AKI, hypernatremia, urinary retention.  Assessment & Plan:   Acute  intraparenchymal and intraventricular hemorrhage, bilateral multifocal ischemic infarcts -Imaging and admission reviewed ischemic and hemorrhagic stroke likely from combination of uncontrolled hypertension and small vessel disease. No improvement in multiple repeat imagings.  Follow-up MRI on 12/16 demonstrated increase in size and number of acute infarcts. -Currently on no blood thinners because of intracranial hemorrhage.  -12/23: recent Cheyne-Stokes respiratory pattern combination of underlying neurological compromise as well as acute respiratory infection -Continues to have various neurogenic patterns of breathing alternating between Cheyne-Stokes and [place]-recent CT scan without evidence of shift or herniation.  Monitor for sudden onset severe hypertension and/or bradycardia  Fever -T-max 100.5 on 01/29/2021: Trach cultures growing Serratia and Klebsiella pneumonia.  Blood cultures have been negative so far.  Currently on cefepime.  T-max of 100 over the last 24 hours.  Swelling of lip -Concern for unwitnessed seizure.  EEG did not show any evidence of seizures.  Neurology did not recommend to start any antiseizure medications and significant on 01/31/2021.  Acute respiratory failure with hypoxia Recent MSSA and Serratia pneumonia, acute Klebsiella and Serratia tracheobronchitis -Completed Zosyn on 12/21. Transferred to ICU on 12/23 as noted above and was found to have acute tracheobronchitis.  Required short-term mechanical ventilation but now back on trach collar. Recent sputum culture with Klebsiella resistant to ampicillin and Serratia resistant to cefazolin.  Currently on Cefepime -PCCM following intermittently  Essential hypertension -Blood pressure currently intermittently elevated.  Continue Coreg, clonidine, hydralazine.    Constipation -Continue MiraLAX  AKI on CKD stage IIIb -Creatinine currently at baseline.  Mild transaminitis -Recurrent issue throughout  hospitalization. CT abdomen and pelvis 12/23 unremarkable no evidence of cirrhosis or other biliary tree issues  Diabetes mellitus type 2 -A1c 6.3 on 11th-22.  Continue Levemir and CBGs with SSI.    Dysphagia  Protein calorie malnutrition -Continue PEG tube feeding  Acute urinary retention -Continue Urecholine.  Currently has Foley catheter.  Last exchanged on 01/23/2021.  Outpatient follow-up with urology  Goals of care -Poor prognosis -No neurological recovery in last several days.  In fact, repeat MRI on 12/16 showed worsening stroke compared to scan on admission. Multiple discussions held with family per pulmonary medicine team as well as palliative medicine. They want to continue aggressive care with Full code status.  DVT prophylaxis: Heparin Code Status: Full Family Communication: Sister at bedside Disposition Plan: Status is: Inpatient  Remains inpatient appropriate because: Of severity of illness  Consultants: PCCM/neurology/palliative care/cardiology  Procedures: As above  Antimicrobials:  Anti-infectives (From admission, onward)    Start     Dose/Rate Route Frequency Ordered Stop   01/30/21 1000  ceFEPIme (MAXIPIME) 2 g in sodium chloride 0.9 % 100 mL IVPB        2 g 200 mL/hr over 30 Minutes Intravenous Every 12 hours 01/30/21 0832     01/27/21 1900  sulfamethoxazole-trimethoprim (BACTRIM DS) 800-160 MG per tablet 1 tablet  Status:  Discontinued        1 tablet Per Tube Every 12 hours 01/27/21 1403 01/30/21 0808   01/24/21 1100  ceFEPIme (MAXIPIME) 2 g in sodium chloride 0.9 % 100 mL IVPB  Status:  Discontinued        2 g 200 mL/hr over 30 Minutes Intravenous Every 8 hours 01/24/21 1025 01/27/21 1403   01/15/21 1630  piperacillin-tazobactam (ZOSYN) IVPB 3.375 g        3.375 g 12.5 mL/hr over 240 Minutes Intravenous Every 8 hours 01/15/21 0943 01/22/21 0400   01/15/21 1030  piperacillin-tazobactam (ZOSYN) IVPB 3.375 g        3.375 g 100 mL/hr over 30 Minutes  Intravenous  Once 01/15/21 0943 01/15/21 1035   01/05/21 1600  ceFAZolin (ANCEF) IVPB 2g/100 mL premix        2 g 200 mL/hr over 30 Minutes Intravenous Every 8 hours 01/05/21 0853 01/10/21 0848   01/03/21 0830  piperacillin-tazobactam (ZOSYN) IVPB 3.375 g  Status:  Discontinued        3.375 g 12.5 mL/hr over 240 Minutes Intravenous Every 8 hours 01/03/21 0800 01/05/21 7342        Subjective: Patient seen and examined at bedside.  Unresponsive.  No seizures, vomiting reported.  Objective: Vitals:   02/01/21 0417 02/01/21 0630 02/01/21 0738 02/01/21 0741  BP:  (!) 168/94 (!) 148/79 (!) 148/79  Pulse:   88 85  Resp:  _0 Temp: 100 F (37.8 C) 100 F (37.8 C)  98.7 F (37.1 C)  TempSrc: Axillary Oral  Axillary  SpO2:  96%  100%  Weight:      Height:        Intake/Output Summary (Last 24 hours) at 02/01/2021 1120 Last data filed at 02/01/2021 1110 Gross per 24 hour  Intake 890 ml  Output 3250 ml  Net -2360 ml   Filed Weights   01/28/21 0408 01/29/21 0500 01/30/21 0500  Weight: 87.7 kg 87.5 kg 88.9 kg    Examination:  General exam: No distress.  Unresponsive. ENT: Trach in place Respiratory system: Bilateral decreased breath sounds at bases with scattered crackles Cardiovascular system: S1 & S2 heard, Rate controlled Gastrointestinal system: Abdomen is distended, soft and nontender. Normal bowel sounds heard.  PEG tube present. Extremities: No cyanosis, clubbing; trace lower extremity edema present  Central nervous system: Unresponsive.   Skin: No  obvious ecchymosis/petechiae  psychiatry: Could not be assessed because of mental status.   Data Reviewed: I have personally reviewed following labs and imaging studies  CBC: Recent Labs  Lab 01/26/21 0107 01/29/21 1009 01/31/21 0202  WBC 11.4* 8.4 8.9  NEUTROABS  --  6.0 5.7  HGB 9.3* 10.2* 9.9*  HCT 28.9* 31.9* 31.2*  MCV 75.1* 74.9* 75.0*  PLT 234 299 734   Basic Metabolic Panel: Recent Labs  Lab  01/26/21 0107 01/29/21 1009 01/31/21 0202  NA 140 138 135  K 4.0 4.1 4.1  CL 112* 109 105  CO2 19* 20* 21*  GLUCOSE 125* 153* 139*  BUN 36* 39* 40*  CREATININE 1.34* 1.57* 1.52*  CALCIUM 8.8* 9.1 8.8*   GFR: Estimated Creatinine Clearance: 58.9 mL/min (A) (by C-G formula based on SCr of 1.52 mg/dL (H)). Liver Function Tests: Recent Labs  Lab 01/29/21 1009 01/31/21 0202  AST 43* 40  ALT 79* 76*  ALKPHOS 58 54  BILITOT 0.6 0.4  PROT 6.4* 6.4*  ALBUMIN 2.0* 2.0*   No results for input(s): LIPASE, AMYLASE in the last 168 hours. No results for input(s): AMMONIA in the last 168 hours. Coagulation Profile: No results for input(s): INR, PROTIME in the last 168 hours. Cardiac Enzymes: Recent Labs  Lab 01/29/21 1009  CKTOTAL 176   BNP (last 3 results) No results for input(s): PROBNP in the last 8760 hours. HbA1C: No results for input(s): HGBA1C in the last 72 hours. CBG: Recent Labs  Lab 01/31/21 1542 01/31/21 2036 01/31/21 2323 02/01/21 0346 02/01/21 0802  GLUCAP 145* 130* 138* 126* 129*   Lipid Profile: No results for input(s): CHOL, HDL, LDLCALC, TRIG, CHOLHDL, LDLDIRECT in the last 72 hours. Thyroid Function Tests: No results for input(s): TSH, T4TOTAL, FREET4, T3FREE, THYROIDAB in the last 72 hours. Anemia Panel: No results for input(s): VITAMINB12, FOLATE, FERRITIN, TIBC, IRON, RETICCTPCT in the last 72 hours. Sepsis Labs: Recent Labs  Lab 01/29/21 1009  PROCALCITON 0.15    Recent Results (from the past 240 hour(s))  Culture, blood (routine x 2)     Status: None   Collection Time: 01/24/21 10:27 AM   Specimen: BLOOD  Result Value Ref Range Status   Specimen Description BLOOD SITE NOT SPECIFIED  Final   Special Requests   Final    BOTTLES DRAWN AEROBIC ONLY Blood Culture results may not be optimal due to an inadequate volume of blood received in culture bottles   Culture   Final    NO GROWTH 5 DAYS Performed at [facility], [place] [address] [place], [place] [address]    Report Status 01/29/2021 FINAL  Final  Culture, blood (routine x 2)     Status: None   Collection Time: 01/24/21 10:27 AM   Specimen: BLOOD  Result Value Ref Range Status   Specimen Description BLOOD SITE NOT SPECIFIED  Final   Special Requests   Final    BOTTLES DRAWN AEROBIC ONLY Blood Culture results may not be optimal due to an inadequate volume of blood received in culture bottles   Culture   Final    NO GROWTH 5 DAYS Performed at [facility], [place] [address], [place], [place] [address]    Report Status 01/29/2021 FINAL  Final  Culture, Respiratory w Gram Stain     Status: None   Collection Time: 01/24/21 10:54 AM   Specimen: Tracheal Aspirate  Result Value Ref Range Status   Specimen Description TRACHEAL ASPIRATE  Final  Special Requests NONE  Final   Gram Stain   Final    MODERATE WBC PRESENT,BOTH PMN AND MONONUCLEAR RARE GRAM POSITIVE COCCI RARE GRAM NEGATIVE RODS Performed at [facility], [place] [address], [place], [place] [address]    Culture   Final    FEW SERRATIA MARCESCENS FEW KLEBSIELLA PNEUMONIAE    Report Status 01/26/2021 FINAL  Final   Organism ID, Bacteria SERRATIA MARCESCENS  Final   Organism ID, Bacteria KLEBSIELLA PNEUMONIAE  Final      Susceptibility   Klebsiella pneumoniae - MIC*    AMPICILLIN >=32 RESISTANT Resistant     CEFAZOLIN <=4 SENSITIVE Sensitive     CEFEPIME <=0.12 SENSITIVE Sensitive     CEFTAZIDIME <=1 SENSITIVE Sensitive     CEFTRIAXONE <=0.25 SENSITIVE Sensitive     CIPROFLOXACIN <=0.25 SENSITIVE Sensitive     GENTAMICIN <=1 SENSITIVE Sensitive     IMIPENEM <=0.25 SENSITIVE Sensitive     TRIMETH/SULFA <=20 SENSITIVE Sensitive     AMPICILLIN/SULBACTAM 4 SENSITIVE Sensitive     PIP/TAZO <=4 SENSITIVE Sensitive     * FEW KLEBSIELLA PNEUMONIAE   Serratia marcescens - MIC*    CEFAZOLIN >=64 RESISTANT Resistant     CEFEPIME <=0.12 SENSITIVE Sensitive     CEFTAZIDIME <=1 SENSITIVE Sensitive      CEFTRIAXONE <=0.25 SENSITIVE Sensitive     CIPROFLOXACIN <=0.25 SENSITIVE Sensitive     GENTAMICIN <=1 SENSITIVE Sensitive     TRIMETH/SULFA <=20 SENSITIVE Sensitive     * FEW SERRATIA MARCESCENS  Culture, blood (routine x 2)     Status: None (Preliminary result)   Collection Time: 01/29/21 10:09 AM   Specimen: Left Antecubital; Blood  Result Value Ref Range Status   Specimen Description LEFT ANTECUBITAL  Final   Special Requests   Final    BOTTLES DRAWN AEROBIC AND ANAEROBIC Blood Culture adequate volume   Culture   Final    NO GROWTH 2 DAYS Performed at [facility], [address], [place], [place] [address]    Report Status PENDING  Incomplete  Culture, blood (routine x 2)     Status: None (Preliminary result)   Collection Time: 01/29/21 10:31 AM   Specimen: BLOOD LEFT HAND  Result Value Ref Range Status   Specimen Description BLOOD LEFT HAND  Final   Special Requests   Final    BOTTLES DRAWN AEROBIC AND ANAEROBIC Blood Culture adequate volume   Culture   Final    NO GROWTH 2 DAYS Performed at [facility], [place] [address], [place], [place] [address]    Report Status PENDING  Incomplete  Urine Culture     Status: None   Collection Time: 01/29/21  3:56 PM   Specimen: Urine, Catheterized  Result Value Ref Range Status   Specimen Description URINE, CATHETERIZED  Final   Special Requests NONE  Final   Culture   Final    NO GROWTH Performed at [facility], [address] [place], [place] [address]    Report Status 01/30/2021 FINAL  Final  Culture, Respiratory w Gram Stain     Status: None (Preliminary result)   Collection Time: 01/30/21 11:18 AM   Specimen: Tracheal Aspirate; Respiratory  Result Value Ref Range Status   Specimen Description TRACHEAL ASPIRATE  Final   Special Requests NONE  Final   Gram Stain   Final    ABUNDANT WBC PRESENT,BOTH PMN AND MONONUCLEAR MODERATE GRAM POSITIVE COCCI RARE GRAM NEGATIVE RODS RARE  GRAM VARIABLE ROD     Culture   Final    ABUNDANT SERRATIA MARCESCENS FEW KLEBSIELLA PNEUMONIAE SUSCEPTIBILITIES TO FOLLOW Performed at [facility], [place] [address] [place], [place] [address]    Report Status PENDING  Incomplete         Radiology Studies: Overnight EEG with video  Result Date: 01/31/2021 Lora Havens, MD     01/31/2021  2:05 PM Patient Name: Stephen Delgado MRN: [id number] Epilepsy Attending: Lora Havens Referring Physician/Provider: Dr Kerney Elbe Duration: 01/31/2021  0035 to 01/31/2021 1045  Patient history: 56 year old male with ICH and IVH.  Noted to have a swollen lip with concern for unwitnessed seizure.  EEG to evaluate for seizure.  Level of alertness:  lethargic  AEDs during EEG study: None  Technical aspects: This EEG study was done with scalp electrodes positioned according to the 10-20 International system of electrode placement. Electrical activity was acquired at a sampling rate of _0  and reviewed with a high frequency filter of _1  and a low frequency filter of _2 . EEG data were recorded continuously and digitally stored.  Description: EEG showed continuous generalized 3 to 6 Hz theta-delta slowing.  Hyperventilation and photic stimulation were not performed.   EKG artifact was seen throughout the study.   ABNORMALITY - Continuous slow, generalized  IMPRESSION: This study is suggestive of moderate diffuse encephalopathy, nonspecific etiology. No seizures or epileptiform discharges were seen throughout the recording.  Priyanka Barbra Sarks        Scheduled Meds:  bethanechol  25 mg Per Tube TID   carvedilol  25 mg Per Tube BID WC   chlorhexidine gluconate (MEDLINE KIT)  15 mL Mouth Rinse BID   Chlorhexidine Gluconate Cloth  6 each Topical Q0600   cloNIDine  0.1 mg Per Tube Q8H   feeding supplement (PROSource TF)  45 mL Per Tube Daily   fiber  1 packet Per Tube BID   free water  100 mL Per Tube Q4H   heparin injection (subcutaneous)  5,000 Units Subcutaneous  Q8H   hydrALAZINE  25 mg Per Tube Q8H   insulin aspart  0-20 Units Subcutaneous Q4H   insulin detemir  20 Units Subcutaneous BID   mouth rinse  15 mL Mouth Rinse 10 times per day   pantoprazole sodium  40 mg Per Tube Daily   polyethylene glycol  17 g Per Tube Daily   scopolamine  1 patch Transdermal Q72H   Continuous Infusions:  ceFEPime (MAXIPIME) IV 2 g (02/01/21 0939)   feeding supplement (GLUCERNA 1.2 CAL) 1,000 mL (02/01/21 0906)          Aline August, MD [facility] 02/01/2021, 11:20 AM

## 2021-02-02 DIAGNOSIS — M6289 Other specified disorders of muscle: Secondary | ICD-10-CM | POA: Insufficient documentation

## 2021-02-02 DIAGNOSIS — R338 Other retention of urine: Secondary | ICD-10-CM

## 2021-02-02 DIAGNOSIS — I6320 Cerebral infarction due to unspecified occlusion or stenosis of unspecified precerebral arteries: Secondary | ICD-10-CM

## 2021-02-02 LAB — GLUCOSE, CAPILLARY
Glucose-Capillary: 132 mg/dL — ABNORMAL HIGH (ref 70–99)
Glucose-Capillary: 137 mg/dL — ABNORMAL HIGH (ref 70–99)
Glucose-Capillary: 151 mg/dL — ABNORMAL HIGH (ref 70–99)
Glucose-Capillary: 141 mg/dL — ABNORMAL HIGH (ref 70–99)
Glucose-Capillary: 150 mg/dL — ABNORMAL HIGH (ref 70–99)

## 2021-02-02 LAB — CULTURE, RESPIRATORY W GRAM STAIN

## 2021-02-02 LAB — CBC WITH DIFFERENTIAL/PLATELET
Abs Immature Granulocytes: 0.13 10*3/uL — ABNORMAL HIGH (ref 0.00–0.07)
Basophils Absolute: 0 10*3/uL (ref 0.0–0.1)
Basophils Relative: 1 %
Eosinophils Absolute: 0.3 10*3/uL (ref 0.0–0.5)
Eosinophils Relative: 4 %
HCT: 33.2 % — ABNORMAL LOW (ref 39.0–52.0)
Hemoglobin: 10.6 g/dL — ABNORMAL LOW (ref 13.0–17.0)
Immature Granulocytes: 2 %
Lymphocytes Relative: 20 %
Lymphs Abs: 1.5 10*3/uL (ref 0.7–4.0)
MCH: 24.1 pg — ABNORMAL LOW (ref 26.0–34.0)
MCHC: 31.9 g/dL (ref 30.0–36.0)
MCV: 75.5 fL — ABNORMAL LOW (ref 80.0–100.0)
Monocytes Absolute: 0.7 10*3/uL (ref 0.1–1.0)
Monocytes Relative: 9 %
Neutro Abs: 5 10*3/uL (ref 1.7–7.7)
Neutrophils Relative %: 64 %
Platelets: 264 10*3/uL (ref 150–400)
RBC: 4.4 MIL/uL (ref 4.22–5.81)
RDW: 15.4 % (ref 11.5–15.5)
WBC: 7.7 10*3/uL (ref 4.0–10.5)
nRBC: 0 % (ref 0.0–0.2)

## 2021-02-02 LAB — BASIC METABOLIC PANEL
Anion gap: 10 (ref 5–15)
BUN: 37 mg/dL — ABNORMAL HIGH (ref 6–20)
CO2: 24 mmol/L (ref 22–32)
Calcium: 9.3 mg/dL (ref 8.9–10.3)
Chloride: 106 mmol/L (ref 98–111)
Creatinine, Ser: 1.18 mg/dL (ref 0.61–1.24)
GFR, Estimated: >60 mL/min (ref >60)
Glucose, Bld: 128 mg/dL — ABNORMAL HIGH (ref 70–99)
Potassium: 4.3 mmol/L (ref 3.5–5.1)
Sodium: 140 mmol/L (ref 135–145)

## 2021-02-02 LAB — MAGNESIUM: Magnesium: 2.2 mg/dL (ref 1.7–2.4)

## 2021-02-02 MED ORDER — FREE WATER
100.0000 mL | Freq: Four times a day (QID) | Status: DC
  Administered 2021-02-02 – 2021-02-17 (×57): 100 mL

## 2021-02-02 MED ORDER — FUROSEMIDE 10 MG/ML IJ SOLN
40.0000 mg | Freq: Once | INTRAMUSCULAR | Status: AC
  Administered 2021-02-02: 40 mg via INTRAVENOUS
  Filled 2021-02-02: qty 4

## 2021-02-02 MED ORDER — JUVEN PO PACK
1.0000 | PACK | Freq: Two times a day (BID) | ORAL | Status: DC
  Administered 2021-02-02 – 2021-04-29 (×169): 1
  Filled 2021-02-02 (×173): qty 1

## 2021-02-02 MED ORDER — GLUCERNA 1.5 CAL PO LIQD
1000.0000 mL | ORAL | Status: DC
  Administered 2021-02-02 – 2021-03-18 (×44): 1000 mL
  Filled 2021-02-02 (×70): qty 1000

## 2021-02-02 MED ORDER — BACLOFEN 5 MG HALF TABLET
5.0000 mg | ORAL_TABLET | Freq: Three times a day (TID) | ORAL | Status: DC
  Administered 2021-02-02 – 2021-02-12 (×40): 5 mg
  Filled 2021-02-02 (×44): qty 1

## 2021-02-02 NOTE — Progress Notes (Signed)
°   02/01/21 1950  Vitals  Temp 98.7 F (37.1 C)  Temp Source Oral  BP (!) 177/93  MAP (mmHg) 117  BP Location Left Arm  BP Method Automatic  Patient Position (if appropriate) Lying  ECG Heart Rate 84  Resp 12  Level of Consciousness  Level of Consciousness Responds to Voice  MEWS COLOR  MEWS Score Color Yellow  Oxygen Therapy  SpO2 95 %  O2 Device Room Air  O2 Flow Rate (L/min) 5 L/min  FiO2 (%) 21 %  Patient Activity (if Appropriate) In bed  MEWS Score  MEWS Temp 0  MEWS Systolic 0  MEWS Pulse 0  MEWS RR 1  MEWS LOC 1  MEWS Score 2  Provider Notification  Provider Name/Title Dr. Myra Gianotti  Date Provider Notified 02/01/21  Time Provider Notified 0830  Notification Type  (secure chat)  Notification Reason Other (Comment) (periods of apnea and then RR 12-20/min. had cheyne stokes on 12/23 also - see notes then)  Provider response See new orders (order for VBG)  Date of Provider Response 02/01/21  Time of Provider Response 0830   VBG result reviewed by MD and per MD no further orders at this time and notify if any changes.

## 2021-02-02 NOTE — TOC Progression Note (Addendum)
Transition of Care [facility]) - Progression Note    Patient Details  Name: Stephen Delgado MRN: [id number] Date of Birth: 1965/02/06  [facility] CM/SW Contact  [name], RN Phone Number: 02/02/2021, 11:46 AM  Clinical Narrative:    CM called and was unable to reach the patient's wife, [name], by phone but left a secure message on her voicemail to return my call to discuss transitions of care needs with the family and likely SNF placement once medically stable for discharge.  CM called and spoke with the patient's son, [name] on the phone and the son states that the patient's wife is likely covering the patient under her insurance.  [name] works at [employer] in the kitchen at this time.  The son states that he works during the day and the younger son is in school at this time so the family is in agreement that the patient would need LTC nursing home placement for care.  The son states that he will speak with the patient's wife and update her regarding discharge planning through DTP Team.  The son states that he will speak with the patient's wife and will assist with update of the patient's insurance provider for placement.  CM and MSW with DTP Team will continue to follow the patient for SNF placement.   Expected Discharge Plan: Skilled Nursing Facility Barriers to Discharge: Continued Medical Work up  Expected Discharge Plan and Services Expected Discharge Plan: Skilled Nursing Facility   Discharge Planning Services: CM Consult Post Acute Care Choice: Skilled Nursing Facility Living arrangements for the past 2 months: Single Family Home                                       Social Determinants of Health (SDOH) Interventions    Readmission Risk Interventions No flowsheet data found.

## 2021-02-02 NOTE — Progress Notes (Addendum)
Nutrition Follow-up  DOCUMENTATION CODES:   Not applicable  INTERVENTION:  Continue TF via PEG: Transition to Glucerna 1.5 @ 71m/hr (14429mday) Free water per MD, currently 10010mree water Q6H   Tube feeding regimen provides 2160 kcal, 118 grams of protein, and 1092 ml of H2O (1492m92mtal free water).  Will also order the following given stage II pressure injury: -1 packet Juven BID, each packet provides 95 calories, 2.5 grams of protein (collagen), and 9.8 grams of carbohydrate (3 grams sugar); also contains 7 grams of L-arginine and L-glutamine, 300 mg vitamin C, 15 mg vitamin E, 1.2 mcg vitamin B-12, 9.5 mg zinc, 200 mg calcium, and 1.5 g  Calcium Beta-hydroxy-Beta-methylbutyrate to support wound healing  NUTRITION DIAGNOSIS:   Inadequate oral intake related to lethargy/confusion, dysphagia as evidenced by NPO status.  ongoing  GOAL:   Patient will meet greater than or equal to 90% of their needs  Met with TF  MONITOR:   Diet advancement, Labs, Weight trends  REASON FOR ASSESSMENT:   Ventilator, Consult Enteral/tube feeding initiation and management  ASSESSMENT:   55 y29r old male who presented to the ED on 11/29 with AMS. PMH of HTN, T2DM, CKD stage III. 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  11/30 cortrak placed; tip gastric 12/2 trickle TF 12/3 pt intubated due to increased WOB, +emesis, TF held 12/4 trickle TF restarted 12/5 advancement orders placed 12/9 TF adjusted 12/13 s/p tracheostomy 12/16 s/p EGD and PEG placement 12/19 tx [facility] of ICU [facility]/23 developed Cheyne-Stokes respiratory pattern with respiratory alkalosis.Transferred to ICU for short-term mechanical ventilation and was found to have new tracheobronchitis now on cefepime 12/[facility], pt's hospital course has been complicated by significant neurological impairment, aspiration events, AKI, hypernatremia,  urinary retention. Pt now medically stable for discharge and awaiting placement in trach capable facility. 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 Continues to receive TF via PEG. Current orders are Glucerna 1.2 @ 70ml40mw/ 45ml 108mource TF daily and 100ml f21mwater Q6H. No issues with tolerance per RN. Plan to adjust regimen given adjusted energy needs.  UOP: 1900ml x262murs I/O: +17602ml sin47mdmit  Current weight: 88.9 kg Admit weight: 85.2 kg   Edema: mild pitting edema to BUE and RUE per RN assessment  Medications: Scheduled Meds:  baclofen  5 mg Per Tube TID AC & HS   bethanechol  25 mg Per Tube TID   carvedilol  25 mg Per Tube BID WC   chlorhexidine gluconate (MEDLINE KIT)  15 mL Mouth Rinse BID   Chlorhexidine Gluconate Cloth  6 each Topical Q0600   cloNIDine  0.1 mg Per Tube Q8H   feeding supplement (PROSource TF)  45 mL Per Tube Daily   fiber  1 packet Per Tube BID   free water  100 mL Per Tube Q6H   furosemide  40 mg Intravenous Once   heparin injection (subcutaneous)  5,000 Units Subcutaneous Q8H   hydrALAZINE  25 mg Per Tube Q8H   insulin aspart  0-20 Units Subcutaneous Q4H   insulin detemir  20 Units Subcutaneous BID   mouth rinse  15 mL Mouth Rinse 10 times per day   pantoprazole sodium  40 mg Per Tube Daily   polyethylene glycol  17 g Per Tube Daily   scopolamine  1 patch Transdermal Q72H  Continuous Infusions:  ceFEPime (MAXIPIME) IV 2  g (02/01/21 2102)   feeding supplement (GLUCERNA 1.2 CAL) 1,000 mL (02/02/21 0051)   Labs: Recent Labs  Lab 01/29/21 1009 01/31/21 0202 02/02/21 0232  NA 138 135 140  K 4.1 4.1 4.3  CL 109 105 106  CO2 20* 21* 24  BUN 39* 40* 37*  CREATININE 1.57* 1.52* 1.18  CALCIUM 9.1 8.8* 9.3  MG  --   --  2.2  GLUCOSE 153* 139* 128*  CBGs: 132-153 x24 hours  Diet Order:   Diet Order             Diet NPO time specified  Diet effective midnight                    EDUCATION NEEDS:   Not appropriate for education at this time  Skin:  Skin Assessment: Skin Integrity Issues: Skin Integrity Issues:: Stage II Stage II: anus  Last BM:  1/01  Height:   Ht Readings from Last 1 Encounters:  01/23/21 6' (1.829 m)    Weight:   Wt Readings from Last 1 Encounters:  01/30/21 88.9 kg   BMI:  Body mass index is 26.58 kg/m.  Estimated Nutritional Needs:   Kcal:  2150-2350  Protein:  105-120 grams  Fluid:  >2L     [name]., MS, RD, LDN (she/her/hers) RD pager number and weekend/on-call pager number located in [place].

## 2021-02-02 NOTE — Progress Notes (Addendum)
[facility] PROGRESS NOTE  Emma Schupp Register WCB:[id number] DOB: Sep 24, 1965 DOA: 12/30/2020 PCP: Patient, [name]   01/29/2021   01/29/2021  Status: Remains inpatient appropriate because:  Unsafe discharge plan.  Patient in persistent vegetative state per stroke needs placement at skilled nursing facility that is trach capable  Barriers to discharge: Social: Current insurance coverage about to expire and he otherwise has no funding for long-term care.  Clarifying regarding application for disability and Medicaid  Clinical: Tracheostomy and PEG dependent with recurrent tracheobronchitis; remains a full code  Level of care:  Med-Surg but transferring back to ICU for mechanical ventilation and stat CT of head due to neurological change as of 12/23   Code Status: Full code Family Communication: Wife at bedside 12/23 DVT prophylaxis: SCDs COVID vaccination status: Unknown  Foley catheter: 45 French inserted on 12/17; changed on 12/24 POA: No  HPI: 56 y.o. male with PMH significant for DM2, HTN, CKD with questionable compliance to medications Patient presented to the ED on 12/30/2020 with complaint of headache, altered mental status   In the ED, he was hypertensive to 238/160, agitated, required restraints CT head showed large amount of intraventricular hemorrhage involving lateral/third/fourth ventricle, ICH and left basal ganglia as well as chronic microvascular ischemic changes. He was started on Cleviprex drip and admitted to neuro ICU   He subsequently had CT head repeated on 11/30, 12/1 and 12/3, all demonstrated unchanged intraparenchymal hemorrhage and intraventricular hemorrhage 12/3, MRI brain from also showed numerous small acute infarcts in both cerebral hemisphere with minimal improvement in the right superior cerebellum 12/3, intubated 12/13, tracheostomy 12/15, wean from vent to trach collar 12/16, PEG tube placement 12/16, repeat MRI showed  an increase in the size and number of acute infarcts in the bilateral hemispheres with increased involvement in the cerebellum bilaterally.   12/18, transferred from neuro service to [facility]  12/21, palliative care consulted.  Family wanted to continue aggressive care as full CODE STATUS. 12/23 developed Cheyne-Stokes respiratory pattern with respiratory alkalosis.  Transferred to ICU for short-term mechanical ventilation and was found to have new tracheobronchitis now on cefepime 12/25 back to progressive floor and to [facility]   His hospital course has been complicated by significant neurological impairment, aspiration events, AKI, hypernatremia, urinary retention.  Subjective: Remains unresponsive including to painful stimulus  Objective: Vitals:   02/02/21 0619 02/02/21 0721  BP: 140/77 (!) 185/101  Pulse:  95  Resp: 17 (!) 23  Temp:    SpO2: 95% 97%    Intake/Output Summary (Last 24 hours) at 02/02/2021 0741 Last data filed at 02/01/2021 1909 Gross per 24 hour  Intake --  Output 1900 ml  Net -1900 ml   Filed Weights   01/28/21 0408 01/29/21 0500 01/30/21 0500  Weight: 87.7 kg 87.5 kg 88.9 kg    Exam:  Constitutional: Remains unresponsive ENT: Swelling in bottom lip has resolved Respiratory: #8.0 Shiley cuffed trach, no further apnea. Bilateral expiratory rhonchi which are less coarse than yesterday-thick tan foul-smelling secretions noted Cardiovascular: S1-S2, sinus tachycardia, normotensive, bilateral lower extremity edema involving the feet up to the knees 1-2+. Abdomen: no tenderness, no masses palpated.  Abdomen slightly distended but soft.  PEG tube -hypoactive bowel sounds positive. LBM 12/31-rectal Foley in place with liquid brown drainage Neurologic: Unable to accurately test cranial nerves.  Eyes closed.  No blink reflex when tested.  Appears to have a more normal respiratory pattern today.  Upper extremities are flaccid to PROM.  Bilateral lower extremities  are hypertonic  with difficulty in flexing at knee and hip with PROM bilaterally. Psychiatric: Unresponsive   Assessment/Plan: Acute problems: Acute intraparenchymal and intraventricular hemorrhage Bilateral multifocal ischemic infarcts -Imaging and admission reviewed ischemic and hemorrhagic stroke likely from combination of uncontrolled hypertension and small vessel disease.  -no improvement in multiple repeat imagings.  Follow-up MRI on 12/16 demonstrated increase in size and number of acute infarcts  -Neurology has signed off as of 12/22 as they have nothing further to offer.  Currently on no blood thinners because of intracranial hemorrhage. -Baseline has episodes of [place] and Cheyne-Stokes respiratory patterns alternating with normal respiratory pattern -1/2 start Baclofen for hypertonicity tx/prophylaxis in brain injury pt-pt with intermittent episodes of HTN that could indicate underlying pain  Swollen lip -concern for unwitnessed seizure -EEG consistent with moderate diffuse encephalopathy without evidence of seizures or epileptiform discharges.  CK 176 -Neurology consulted and no indications for LTM or AED  Acute respiratory failure with hypoxia 2/2 recurrent Serratia tracheobronchitis (recent MSSA and serratia pneumonia; recent Klebsiella tracheobronchitis); -Completed Zosyn on 12/21 -12/23  acute tracheobronchitis.  Required short-term mechanical ventilation -12/28 After consultation with pharmacist de-escalated to Bactrim but given recurrent fevers and increased and pulmonary secretions pulmonary medicine recommended changing to Cefepime and obtaining sputum culture -12/30 Rpt resp cx c/w abundant Serratia and minimal amount of Klebsiella-sensitivities demonstrate both organisms sensitive to cefepime so we will can continue for now.  At this juncture given recent emergence of Serratia dominant pneumonia after de-escalating antibiotics would likely not do so early in course of  treatment  Bilateral lower extremity edema -Decrease free water to 100 cc every 6 hours -Give one-time dose of IV Lasix 40 mg  Essential hypertension/EKG with LVH strain -Blood pressure on admission was elevated to over 882 systolic. -Continue max dose Coreg and clonidine; continue recent addition of hydralazine -Has IV Apresoline and Normodyne available prn -Suspect hypertonicity and associated pain contributing-see above regarding treatment  Mild abdominal distention/?  Constipation -Recent imaging unremarkable -Continue scheduled MiraLAX   AKI on CKD 3b -Baseline renal function: January 2022 BUN was 18 and creatinine 1.49 -Current creatinine 1.18 with a BUN of 37   Hypernatremia Resolved -Na+ peak 154 with current reading 140  Non obstructive transaminitis -Recurrent issue throughout hospitalization -Peak AST 163 and ALT 133 on 12/14.  On 12/16 had decreased to 58 and 76. -Today on 12/29 AST 43 and ALT 79 -CT abdomen and pelvis 12/23 unremarkable no evidence of cirrhosis or other biliary tree issues -Follow closely on Tylenol; carvedilol can cause ALT or AST elevation as well -Hep C antibody within normal limit -12/29 hepatitis panel unremarkable   Type 2 diabetes mellitus -A1c 6.3 in 12/31/2020 -Continue Levemir 20 units BID along with SSI every 4 hours   Protein calorie malnutrition/dysphagia Nutrition Problem: Inadequate oral intake Etiology: lethargy/confusion, dysphagia Signs/Symptoms: NPO status Interventions: Tube feeding via PEG Body mass index is 26.58 kg/m.    Acute urinary retention  -Continue Urecholine -Currently has Foley catheter.  Last exchanged on 12/23 -Once has recovered from current episode of tracheobronchitis can begin Foley clamping trials to see if can remove Foley   Goals of care -Poor prognosis.  No neurological recovery in last several days.  In fact repeat MRI on 12/16 shows worsening stroke compared to scan on admission.  -Multiple  discussions held with family per pulmonary medicine team as well as palliative medicine. They want to continue aggressive care with FULL CODE STATUS at this time.  Data Reviewed: Basic Metabolic Panel: Recent Labs  Lab 01/29/21 1009 01/31/21 0202 02/02/21 0232  NA 138 135 140  K 4.1 4.1 4.3  CL 109 105 106  CO2 20* 21* 24  GLUCOSE 153* 139* 128*  BUN 39* 40* 37*  CREATININE 1.57* 1.52* 1.18  CALCIUM 9.1 8.8* 9.3  MG  --   --  2.2    CBC: Recent Labs  Lab 01/29/21 1009 01/31/21 0202 02/02/21 0232  WBC 8.4 8.9 7.7  NEUTROABS 6.0 5.7 5.0  HGB 10.2* 9.9* 10.6*  HCT 31.9* 31.2* 33.2*  MCV 74.9* 75.0* 75.5*  PLT 299 275 264     CBG: Recent Labs  Lab 02/01/21 1151 02/01/21 1615 02/01/21 1950 02/01/21 2343 02/02/21 0459  GLUCAP 149* 133* 136* 153* 132*    Recent Results (from the past 240 hour(s))  Culture, blood (routine x 2)     Status: None   Collection Time: 01/24/21 10:27 AM   Specimen: BLOOD  Result Value Ref Range Status   Specimen Description BLOOD SITE NOT SPECIFIED  Final   Special Requests   Final    BOTTLES DRAWN AEROBIC ONLY Blood Culture results may not be optimal due to an inadequate volume of blood received in culture bottles   Culture   Final    NO GROWTH 5 DAYS Performed at [facility], [place] [address], [place], [place] [address]    Report Status 01/29/2021 FINAL  Final  Culture, blood (routine x 2)     Status: None   Collection Time: 01/24/21 10:27 AM   Specimen: BLOOD  Result Value Ref Range Status   Specimen Description BLOOD SITE NOT SPECIFIED  Final   Special Requests   Final    BOTTLES DRAWN AEROBIC ONLY Blood Culture results may not be optimal due to an inadequate volume of blood received in culture bottles   Culture   Final    NO GROWTH 5 DAYS Performed at [facility], [place] [address] [place], [place] [address]    Report Status 01/29/2021 FINAL  Final  Culture, Respiratory w Gram Stain     Status: None    Collection Time: 01/24/21 10:54 AM   Specimen: Tracheal Aspirate  Result Value Ref Range Status   Specimen Description TRACHEAL ASPIRATE  Final   Special Requests NONE  Final   Gram Stain   Final    MODERATE WBC PRESENT,BOTH PMN AND MONONUCLEAR RARE GRAM POSITIVE COCCI RARE GRAM NEGATIVE RODS Performed at [facility], [place] [address] [place], [place] [address]    Culture   Final    FEW SERRATIA MARCESCENS FEW KLEBSIELLA PNEUMONIAE    Report Status 01/26/2021 FINAL  Final   Organism ID, Bacteria SERRATIA MARCESCENS  Final   Organism ID, Bacteria KLEBSIELLA PNEUMONIAE  Final      Susceptibility   Klebsiella pneumoniae - MIC*    AMPICILLIN >=32 RESISTANT Resistant     CEFAZOLIN <=4 SENSITIVE Sensitive     CEFEPIME <=0.12 SENSITIVE Sensitive     CEFTAZIDIME <=1 SENSITIVE Sensitive     CEFTRIAXONE <=0.25 SENSITIVE Sensitive     CIPROFLOXACIN <=0.25 SENSITIVE Sensitive     GENTAMICIN <=1 SENSITIVE Sensitive     IMIPENEM <=0.25 SENSITIVE Sensitive     TRIMETH/SULFA <=20 SENSITIVE Sensitive     AMPICILLIN/SULBACTAM 4 SENSITIVE Sensitive     PIP/TAZO <=4 SENSITIVE Sensitive     * FEW KLEBSIELLA PNEUMONIAE   Serratia marcescens - MIC*    CEFAZOLIN >=64  RESISTANT Resistant     CEFEPIME <=0.12 SENSITIVE Sensitive     CEFTAZIDIME <=1 SENSITIVE Sensitive     CEFTRIAXONE <=0.25 SENSITIVE Sensitive     CIPROFLOXACIN <=0.25 SENSITIVE Sensitive     GENTAMICIN <=1 SENSITIVE Sensitive     TRIMETH/SULFA <=20 SENSITIVE Sensitive     * FEW SERRATIA MARCESCENS  Culture, blood (routine x 2)     Status: None (Preliminary result)   Collection Time: 01/29/21 10:09 AM   Specimen: Left Antecubital; Blood  Result Value Ref Range Status   Specimen Description LEFT ANTECUBITAL  Final   Special Requests   Final    BOTTLES DRAWN AEROBIC AND ANAEROBIC Blood Culture adequate volume   Culture   Final    NO GROWTH 3 DAYS Performed at [facility], [place] [address] [place], [place]  [address]    Report Status PENDING  Incomplete  Culture, blood (routine x 2)     Status: None (Preliminary result)   Collection Time: 01/29/21 10:31 AM   Specimen: BLOOD LEFT HAND  Result Value Ref Range Status   Specimen Description BLOOD LEFT HAND  Final   Special Requests   Final    BOTTLES DRAWN AEROBIC AND ANAEROBIC Blood Culture adequate volume   Culture   Final    NO GROWTH 3 DAYS Performed at [facility], [place] [address], [place], [place] [address]    Report Status PENDING  Incomplete  Urine Culture     Status: None   Collection Time: 01/29/21  3:56 PM   Specimen: Urine, Catheterized  Result Value Ref Range Status   Specimen Description URINE, CATHETERIZED  Final   Special Requests NONE  Final   Culture   Final    NO GROWTH Performed at [facility], [address] [place], [place] [address]    Report Status 01/30/2021 FINAL  Final  Culture, Respiratory w Gram Stain     Status: None (Preliminary result)   Collection Time: 01/30/21 11:18 AM   Specimen: Tracheal Aspirate; Respiratory  Result Value Ref Range Status   Specimen Description TRACHEAL ASPIRATE  Final   Special Requests NONE  Final   Gram Stain   Final    ABUNDANT WBC PRESENT,BOTH PMN AND MONONUCLEAR MODERATE GRAM POSITIVE COCCI RARE GRAM NEGATIVE RODS RARE GRAM VARIABLE ROD    Culture   Final    ABUNDANT SERRATIA MARCESCENS FEW KLEBSIELLA PNEUMONIAE SUSCEPTIBILITIES TO FOLLOW Performed at [facility], [place] [address] [place], [place] [address]    Report Status PENDING  Incomplete     Studies: Overnight EEG with video  Result Date: 01/31/2021 Lora Havens, MD     01/31/2021  2:05 PM Patient Name: Stephen Delgado MRN: [id number] Epilepsy Attending: Lora Havens Referring Physician/Provider: Dr Kerney Elbe Duration: 01/31/2021  0035 to 01/31/2021 1045  Patient history: 56 year old male with ICH and IVH.  Noted to have a swollen lip with concern for unwitnessed seizure.  EEG to  evaluate for seizure.  Level of alertness:  lethargic  AEDs during EEG study: None  Technical aspects: This EEG study was done with scalp electrodes positioned according to the 10-20 International system of electrode placement. Electrical activity was acquired at a sampling rate of 500Hz and reviewed with a high frequency filter of 70Hz and a low frequency filter of 1Hz. EEG data were recorded continuously and digitally stored.  Description: EEG showed continuous generalized 3 to 6 Hz theta-delta slowing.  Hyperventilation and photic  stimulation were not performed.   EKG artifact was seen throughout the study.   ABNORMALITY - Continuous slow, generalized  IMPRESSION: This study is suggestive of moderate diffuse encephalopathy, nonspecific etiology. No seizures or epileptiform discharges were seen throughout the recording.  Priyanka Barbra Sarks    Scheduled Meds:  baclofen  5 mg Per Tube QID   bethanechol  25 mg Per Tube TID   carvedilol  25 mg Per Tube BID WC   chlorhexidine gluconate (MEDLINE KIT)  15 mL Mouth Rinse BID   Chlorhexidine Gluconate Cloth  6 each Topical Q0600   cloNIDine  0.1 mg Per Tube Q8H   feeding supplement (PROSource TF)  45 mL Per Tube Daily   fiber  1 packet Per Tube BID   free water  100 mL Per Tube Q4H   heparin injection (subcutaneous)  5,000 Units Subcutaneous Q8H   hydrALAZINE  25 mg Per Tube Q8H   insulin aspart  0-20 Units Subcutaneous Q4H   insulin detemir  20 Units Subcutaneous BID   mouth rinse  15 mL Mouth Rinse 10 times per day   pantoprazole sodium  40 mg Per Tube Daily   polyethylene glycol  17 g Per Tube Daily   scopolamine  1 patch Transdermal Q72H   Continuous Infusions:  ceFEPime (MAXIPIME) IV 2 g (02/01/21 2102)   feeding supplement (GLUCERNA 1.2 CAL) 1,000 mL (02/02/21 0051)    Principal Problem:   ICH (intracerebral hemorrhage) ([place]) Active Problems:   Malignant hypertension   Diabetes mellitus type 2 in obese ([place])   Acute respiratory failure  ([place])   Tracheostomy dependence ([place])   Pneumonia due to Serratia marcescens ([place])   MSSA (methicillin susceptible Staphylococcus aureus) pneumonia ([place])   Stage 3b chronic kidney disease (CKD) ([place])   Acute kidney injury ([place])   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 ([place])   Acute hypernatremia   Dysphagia due to recent stroke   Acute urinary retention   Respiration abnormal   Pressure injury of skin   Abdominal distention   Intractable hiccups   Pneumonia due to Klebsiella pneumoniae ([place])   Swollen lip   Fever   Consultants: Neurology PCCM Medicine  Procedures: Echocardiogram EEG Cortrack PEG tube placement Tracheostomy tube placement  Antibiotics: Zosyn 12/15 through 12/21 Cefepime 12/24 through 12/27 Bactrim 12/27 >>   Time spent: 45 minutes    Erin Hearing ANP  [facility] 7 am - 330 pm/M-F for direct patient care and secure chat Please refer to Amion for contact info 34  days

## 2021-02-03 DIAGNOSIS — I10 Essential (primary) hypertension: Secondary | ICD-10-CM

## 2021-02-03 DIAGNOSIS — J156 Pneumonia due to other aerobic Gram-negative bacteria: Secondary | ICD-10-CM

## 2021-02-03 DIAGNOSIS — Z930 Tracheostomy status: Secondary | ICD-10-CM

## 2021-02-03 DIAGNOSIS — J150 Pneumonia due to Klebsiella pneumoniae: Secondary | ICD-10-CM

## 2021-02-03 LAB — GLUCOSE, CAPILLARY
Glucose-Capillary: 133 mg/dL — ABNORMAL HIGH (ref 70–99)
Glucose-Capillary: 145 mg/dL — ABNORMAL HIGH (ref 70–99)
Glucose-Capillary: 146 mg/dL — ABNORMAL HIGH (ref 70–99)
Glucose-Capillary: 148 mg/dL — ABNORMAL HIGH (ref 70–99)
Glucose-Capillary: 148 mg/dL — ABNORMAL HIGH (ref 70–99)
Glucose-Capillary: 149 mg/dL — ABNORMAL HIGH (ref 70–99)
Glucose-Capillary: 178 mg/dL — ABNORMAL HIGH (ref 70–99)

## 2021-02-03 LAB — CULTURE, BLOOD (ROUTINE X 2)
Culture: NO GROWTH
Culture: NO GROWTH
Special Requests: ADEQUATE
Special Requests: ADEQUATE

## 2021-02-03 LAB — CBC WITH DIFFERENTIAL/PLATELET
Abs Immature Granulocytes: 0.14 10*3/uL — ABNORMAL HIGH (ref 0.00–0.07)
Basophils Absolute: 0 10*3/uL (ref 0.0–0.1)
Basophils Relative: 0 %
Eosinophils Absolute: 0.3 10*3/uL (ref 0.0–0.5)
Eosinophils Relative: 4 %
HCT: 32.8 % — ABNORMAL LOW (ref 39.0–52.0)
Hemoglobin: 10.6 g/dL — ABNORMAL LOW (ref 13.0–17.0)
Immature Granulocytes: 2 %
Lymphocytes Relative: 20 %
Lymphs Abs: 1.6 10*3/uL (ref 0.7–4.0)
MCH: 24.3 pg — ABNORMAL LOW (ref 26.0–34.0)
MCHC: 32.3 g/dL (ref 30.0–36.0)
MCV: 75.2 fL — ABNORMAL LOW (ref 80.0–100.0)
Monocytes Absolute: 0.7 10*3/uL (ref 0.1–1.0)
Monocytes Relative: 9 %
Neutro Abs: 5.4 10*3/uL (ref 1.7–7.7)
Neutrophils Relative %: 65 %
Platelets: 262 10*3/uL (ref 150–400)
RBC: 4.36 MIL/uL (ref 4.22–5.81)
RDW: 15.6 % — ABNORMAL HIGH (ref 11.5–15.5)
WBC: 8.3 10*3/uL (ref 4.0–10.5)
nRBC: 0 % (ref 0.0–0.2)

## 2021-02-03 LAB — BASIC METABOLIC PANEL
Anion gap: 8 (ref 5–15)
BUN: 44 mg/dL — ABNORMAL HIGH (ref 6–20)
CO2: 25 mmol/L (ref 22–32)
Calcium: 8.9 mg/dL (ref 8.9–10.3)
Chloride: 105 mmol/L (ref 98–111)
Creatinine, Ser: 1.16 mg/dL (ref 0.61–1.24)
GFR, Estimated: >60 mL/min (ref >60)
Glucose, Bld: 139 mg/dL — ABNORMAL HIGH (ref 70–99)
Potassium: 4 mmol/L (ref 3.5–5.1)
Sodium: 138 mmol/L (ref 135–145)

## 2021-02-03 LAB — SEDIMENTATION RATE: Sed Rate: 57 mm/hr — ABNORMAL HIGH (ref 0–16)

## 2021-02-03 MED ORDER — SODIUM CHLORIDE 0.9 % IV SOLN
2.0000 g | Freq: Three times a day (TID) | INTRAVENOUS | Status: AC
  Administered 2021-02-03 – 2021-02-05 (×7): 2 g via INTRAVENOUS
  Filled 2021-02-03 (×7): qty 2

## 2021-02-03 NOTE — TOC Progression Note (Signed)
Transition of Care [facility]) - Progression Note    Patient Details  Name: Stephen Delgado MRN: [id number] Date of Birth: 02/14/1965  Transition of Care [facility]) CM/SW Contact  [name], RN Phone Number: 02/03/2021, 11:27 AM  Clinical Narrative:    CM returned a call to [place], CM at [facility] at [phone] ext [phone] and the patient is no longer covered under [employer] benefits.  I spoke with the patient's son yesterday and he states that the patient's wife has applied for insurance under her current employment and he will follow up regarding patient's insurance coverage.  [name], MSW called and left a message with the patient's wife for follow up.  Patient will likely need SNF/LTC placement for care since the patient's family is unable to safely care for the patient at home per the son at this time.  CM and MSW with DTP Team will continue to follow the patient for SNF/LTC placement.   Expected Discharge Plan: Skilled Nursing Facility Barriers to Discharge: Continued Medical Work up  Expected Discharge Plan and Services Expected Discharge Plan: Skilled Nursing Facility   Discharge Planning Services: CM Consult Post Acute Care Choice: Skilled Nursing Facility Living arrangements for the past 2 months: Single Family Home                                       Social Determinants of Health (SDOH) Interventions    Readmission Risk Interventions No flowsheet data found.

## 2021-02-03 NOTE — Progress Notes (Signed)
Pharmacy Antibiotic Note  Stephen Delgado is a 56 y.o. male admitted on 12/30/2020 with ICH likely 2/2 to HTN emergency and now concern for hospital acquired pneumonia / tracheobronchitis. Extended hospitalization complicated by ICU admission with new trach (12/13) and PEG (12/26). WBC wnl and Tm 100.4 (1/3 at 0200). Patient continues on Cefepime for klebsiella and serratia tracheobronchitis.  Note, renal function has improved with CrCl ~ 75.  Today is day #5 of therapy.  Plan: Increase Cefepime 2g q8h F/u renal function, length of therapy and culture results  Height: 6' (182.9 cm) Weight: 88.9 kg (195 lb 15.8 oz) IBW/kg (Calculated) : 77.6  Temp (24hrs), Avg:99.3 F (37.4 C), Min:98 F (36.7 C), Max:100.4 F (38 C)  Recent Labs  Lab 01/29/21 1009 01/31/21 0202 02/02/21 0232 02/03/21 0751  WBC 8.4 8.9 7.7 8.3  CREATININE 1.57* 1.52* 1.18 1.16     Estimated Creatinine Clearance: 77.1 mL/min (by C-G formula based on SCr of 1.16 mg/dL).    No Known Allergies  Antimicrobials this admission: Zosyn 12/3 > 12/5, 12/15 > 12/21 Cefazolin 12/5 > 12/10 Cefepime 12/24 >12/27, 12/30> Bactrim 12/27> 12/29  Microbiology results: 12/24 Resp Cx: serratia marcescens (R. Cefazolin), K. Pneumoniae (R amp)  12/15 Resp cx: serratia marcescens (R. Cefazolin), s. Aureus (R erythromycin, clinda)  12/3 Resp cx: S. Aureus (R erythromycin, clinda)  12/14 Bcx: negative 12/24 Bcx: negative 12/29 Bcx: ngtd 12/29 Ucx: NG 12/30 TAcx: GPC, GNR, GVR > Serratia and Kleb PNA S:CTX, Cefepime, Cipro, Bactrim  Thank you for allowing pharmacy to participate in this patient's care.  [employer], Pharm.D., BCPS Clinical Pharmacist  **Pharmacist phone directory can be found on [email or web address] listed under [facility].  02/03/2021 11:52 AM

## 2021-02-03 NOTE — Progress Notes (Signed)
CSW attempted to reach patient's wife [name] without success - a voicemail was left requesting a return call.  [name], MSW, LCSW Transitions of Care   Clinical Social Worker II [phone]

## 2021-02-03 NOTE — Progress Notes (Signed)
[facility] PROGRESS NOTE  Cameran Pettey Hunnicutt KCL:[id number] DOB: 06/21/1965 DOA: 12/30/2020 PCP: Patient, [name]   01/29/2021    01/29/2021                 02/04/2020  Status: Remains inpatient appropriate because:  Unsafe discharge plan.  Patient in persistent vegetative state per stroke needs placement at skilled nursing facility that is trach capable  Barriers to discharge: Social: Current insurance coverage about to expire and he otherwise has no funding for long-term care.  Clarifying regarding application for disability and Medicaid  Clinical: Tracheostomy and PEG dependent with recurrent tracheobronchitis; remains a full code  Level of care:  Med-Surg but transferring back to ICU for mechanical ventilation and stat CT of head due to neurological change as of 12/23   Code Status: Full code Family Communication: Wife at bedside 12/23 DVT prophylaxis: SCDs COVID vaccination status: Unknown  Foley catheter: 92 French inserted on 12/17; changed on 12/24 POA: No  HPI: 56 y.o. male with PMH significant for DM2, HTN, CKD with questionable compliance to medications Patient presented to the ED on 12/30/2020 with complaint of headache, altered mental status   In the ED, he was hypertensive to 238/160, agitated, required restraints CT head showed large amount of intraventricular hemorrhage involving lateral/third/fourth ventricle, ICH and left basal ganglia as well as chronic microvascular ischemic changes. He was started on Cleviprex drip and admitted to neuro ICU   He subsequently had CT head repeated on 11/30, 12/1 and 12/3, all demonstrated unchanged intraparenchymal hemorrhage and intraventricular hemorrhage 12/3, MRI brain from also showed numerous small acute infarcts in both cerebral hemisphere with minimal improvement in the right superior cerebellum 12/3, intubated 12/13, tracheostomy 12/15, wean from vent to trach collar 12/16, PEG tube  placement 12/16, repeat MRI showed an increase in the size and number of acute infarcts in the bilateral hemispheres with increased involvement in the cerebellum bilaterally.   12/18, transferred from neuro service to [facility]  12/21, palliative care consulted.  Family wanted to continue aggressive care as full CODE STATUS. 12/23 developed Cheyne-Stokes respiratory pattern with respiratory alkalosis.  Transferred to ICU for short-term mechanical ventilation and was found to have new tracheobronchitis now on cefepime 12/25 back to progressive floor and to [facility]   His hospital course has been complicated by significant neurological impairment, aspiration events, AKI, hypernatremia, urinary retention.  Subjective: Remains unresponsive  Objective: Vitals:   02/03/21 0439 02/03/21 0622  BP: (!) 154/85 140/89  Pulse:    Resp: 17 (!) 24  Temp: 99.7 F (37.6 C)   SpO2: 96% 94%    Intake/Output Summary (Last 24 hours) at 02/03/2021 0735 Last data filed at 02/03/2021 4944 Gross per 24 hour  Intake 1335 ml  Output 6650 ml  Net -5315 ml   Filed Weights   01/28/21 0408 01/29/21 0500 01/30/21 0500  Weight: 87.7 kg 87.5 kg 88.9 kg    Exam:  Constitutional: Remains unresponsive ENT: Swelling in bottom lip has resolved-previous facial swelling has resolved after IV Lasix Respiratory: #8.0 Shiley cuffed trach, no further apnea. Bilateral fine expiratory rhonchi, trach dressing clean and dry and no secretions noted Cardiovascular: S1-S2, NSR, normotensive, bilateral lower extremity edema essentially resolved with only trace pedal edema noted Abdomen: no tenderness, no masses palpated.  Abdomen slightly distended but soft.  PEG tube -hypoactive bowel sounds positive. rectal Foley in place with liquid brown drainage Neurologic: Unable to accurately test cranial nerves.  Eyes closed.  No blink reflex  when tested.  Appears to have a more normal respiratory pattern today.  Upper extremities are flaccid to  PROM.  Bilateral lower extremities less hypertonic with improved flexion with PROM bilaterally. Psychiatric: Unresponsive   Assessment/Plan: Acute problems: Acute intraparenchymal and intraventricular hemorrhage Bilateral multifocal ischemic infarcts -Imaging and admission reviewed ischemic and hemorrhagic stroke likely from combination of uncontrolled hypertension and small vessel disease.  -no improvement in multiple repeat imagings.  Follow-up MRI on 12/16 demonstrated increase in size and number of acute infarcts  -Neurology has signed off as of 12/22 as they have nothing further to offer.  Currently on no blood thinners because of intracranial hemorrhage. -Baseline has episodes of [place] and Cheyne-Stokes respiratory patterns alternating with normal respiratory pattern -1/2 started Baclofen for hypertonicity tx with improvement in bilateral lower extremity hypertonicity -pt with intermittent episodes of HTN/tachycardia that could indicate underlying pain  Swollen lip -Resolved -concern for unwitnessed seizure -EEG consistent with moderate diffuse encephalopathy without evidence of seizures or epileptiform discharges.  CK 176 -Neurology consulted and no indications for LTM or AED  Acute respiratory failure with hypoxia 2/2 recurrent Serratia tracheobronchitis (recent MSSA and serratia pneumonia; recent Klebsiella tracheobronchitis); -Completed Zosyn on 12/21 -12/23  acute tracheobronchitis.  Required short-term mechanical ventilation -12/28 After consultation with pharmacist de-escalated to Bactrim but given recurrent fevers and increased and pulmonary secretions pulmonary medicine recommended changing to Cefepime and obtaining sputum culture -12/30 Rpt resp cx c/w abundant Serratia and minimal amount of Klebsiella-sensitivities demonstrate both organisms sensitive to cefepime so we will can continue for now.  At this juncture given recent emergence of Serratia dominant pneumonia after  de-escalating antibiotics would likely not do so early in course of treatment -ESR 57 from 78 on 12/29  Bilateral lower extremity edema -1/2 decreased free water to 100 cc every 6 hours -Significant improvement in overall edema including lower extremity and facial edema after Lasix IV 40 mg on 1/2. UOP past 24 hrs 7850 cc -BUN 44 with creatinine 1.16 which is stable  Essential hypertension/EKG with LVH strain -Blood pressure on admission was elevated to over 295 systolic. -Current blood pressure ranging between 121/80 and 154/85 -Continue max dose Coreg and clonidine; continue recent addition of hydralazine -Has IV Apresoline and Normodyne available prn -Suspect hypertonicity and associated pain contributing-see above regarding treatment  Mild abdominal distention/?  Constipation -Recent imaging unremarkable -Continue scheduled MiraLAX   AKI on CKD 3b -Baseline renal function: January 2022 BUN was 18 and creatinine 1.49 -Current creatinine 1.18 with a BUN of 37   Hypernatremia Resolved -Na+ peak 154 with current reading 140  Non obstructive transaminitis -Recurrent issue throughout hospitalization -Peak AST 163 and ALT 133 on 12/14.  On 12/16 had decreased to 58 and 76. -Today on 12/29 AST 43 and ALT 79 -CT abdomen and pelvis 12/23 unremarkable no evidence of cirrhosis or other biliary tree issues -Follow closely on Tylenol; carvedilol can cause ALT or AST elevation as well -Hep C antibody within normal limit -12/29 hepatitis panel unremarkable   Type 2 diabetes mellitus -A1c 6.3 in 12/31/2020 -Continue Levemir 20 units BID along with SSI every 4 hours   Protein calorie malnutrition/dysphagia Nutrition Problem: Inadequate oral intake Etiology: lethargy/confusion, dysphagia Signs/Symptoms: NPO status Interventions: Tube feeding via PEG Body mass index is 26.58 kg/m.    Acute urinary retention  -Continue Urecholine -Currently has Foley catheter.  Last exchanged on  12/23 -Once has recovered from current episode of tracheobronchitis can begin Foley clamping trials to see if can remove Foley  Goals of care -Poor prognosis.  No neurological recovery in last several days.  In fact repeat MRI on 12/16 shows worsening stroke compared to scan on admission.  -Multiple discussions held with family per pulmonary medicine team as well as palliative medicine. They want to continue aggressive care with FULL CODE STATUS at this time.         Data Reviewed: Basic Metabolic Panel: Recent Labs  Lab 01/29/21 1009 01/31/21 0202 02/02/21 0232  NA 138 135 140  K 4.1 4.1 4.3  CL 109 105 106  CO2 20* 21* 24  GLUCOSE 153* 139* 128*  BUN 39* 40* 37*  CREATININE 1.57* 1.52* 1.18  CALCIUM 9.1 8.8* 9.3  MG  --   --  2.2    CBC: Recent Labs  Lab 01/29/21 1009 01/31/21 0202 02/02/21 0232  WBC 8.4 8.9 7.7  NEUTROABS 6.0 5.7 5.0  HGB 10.2* 9.9* 10.6*  HCT 31.9* 31.2* 33.2*  MCV 74.9* 75.0* 75.5*  PLT 299 275 264     CBG: Recent Labs  Lab 02/02/21 1207 02/02/21 1614 02/02/21 2036 02/03/21 0025 02/03/21 0439  GLUCAP 151* 141* 150* 133* 149*    Recent Results (from the past 240 hour(s))  Culture, blood (routine x 2)     Status: None   Collection Time: 01/24/21 10:27 AM   Specimen: BLOOD  Result Value Ref Range Status   Specimen Description BLOOD SITE NOT SPECIFIED  Final   Special Requests   Final    BOTTLES DRAWN AEROBIC ONLY Blood Culture results may not be optimal due to an inadequate volume of blood received in culture bottles   Culture   Final    NO GROWTH 5 DAYS Performed at [facility], [place] [address], [place], [place] [address]    Report Status 01/29/2021 FINAL  Final  Culture, blood (routine x 2)     Status: None   Collection Time: 01/24/21 10:27 AM   Specimen: BLOOD  Result Value Ref Range Status   Specimen Description BLOOD SITE NOT SPECIFIED  Final   Special Requests   Final    BOTTLES DRAWN AEROBIC ONLY Blood Culture  results may not be optimal due to an inadequate volume of blood received in culture bottles   Culture   Final    NO GROWTH 5 DAYS Performed at [facility], [place] [address], [place], [place] [address]    Report Status 01/29/2021 FINAL  Final  Culture, Respiratory w Gram Stain     Status: None   Collection Time: 01/24/21 10:54 AM   Specimen: Tracheal Aspirate  Result Value Ref Range Status   Specimen Description TRACHEAL ASPIRATE  Final   Special Requests NONE  Final   Gram Stain   Final    MODERATE WBC PRESENT,BOTH PMN AND MONONUCLEAR RARE GRAM POSITIVE COCCI RARE GRAM NEGATIVE RODS Performed at [facility], [place] [address] [place], [place] [address]    Culture   Final    FEW SERRATIA MARCESCENS FEW KLEBSIELLA PNEUMONIAE    Report Status 01/26/2021 FINAL  Final   Organism ID, Bacteria SERRATIA MARCESCENS  Final   Organism ID, Bacteria KLEBSIELLA PNEUMONIAE  Final      Susceptibility   Klebsiella pneumoniae - MIC*    AMPICILLIN >=32 RESISTANT Resistant     CEFAZOLIN <=4 SENSITIVE Sensitive     CEFEPIME <=0.12 SENSITIVE Sensitive     CEFTAZIDIME <=1 SENSITIVE Sensitive     CEFTRIAXONE <=0.25 SENSITIVE Sensitive  CIPROFLOXACIN <=0.25 SENSITIVE Sensitive     GENTAMICIN <=1 SENSITIVE Sensitive     IMIPENEM <=0.25 SENSITIVE Sensitive     TRIMETH/SULFA <=20 SENSITIVE Sensitive     AMPICILLIN/SULBACTAM 4 SENSITIVE Sensitive     PIP/TAZO <=4 SENSITIVE Sensitive     * FEW KLEBSIELLA PNEUMONIAE   Serratia marcescens - MIC*    CEFAZOLIN >=64 RESISTANT Resistant     CEFEPIME <=0.12 SENSITIVE Sensitive     CEFTAZIDIME <=1 SENSITIVE Sensitive     CEFTRIAXONE <=0.25 SENSITIVE Sensitive     CIPROFLOXACIN <=0.25 SENSITIVE Sensitive     GENTAMICIN <=1 SENSITIVE Sensitive     TRIMETH/SULFA <=20 SENSITIVE Sensitive     * FEW SERRATIA MARCESCENS  Culture, blood (routine x 2)     Status: None (Preliminary result)   Collection Time: 01/29/21 10:09 AM   Specimen: Left  Antecubital; Blood  Result Value Ref Range Status   Specimen Description LEFT ANTECUBITAL  Final   Special Requests   Final    BOTTLES DRAWN AEROBIC AND ANAEROBIC Blood Culture adequate volume   Culture   Final    NO GROWTH 4 DAYS Performed at [facility], [address], [place], [place] [address]    Report Status PENDING  Incomplete  Culture, blood (routine x 2)     Status: None (Preliminary result)   Collection Time: 01/29/21 10:31 AM   Specimen: BLOOD LEFT HAND  Result Value Ref Range Status   Specimen Description BLOOD LEFT HAND  Final   Special Requests   Final    BOTTLES DRAWN AEROBIC AND ANAEROBIC Blood Culture adequate volume   Culture   Final    NO GROWTH 4 DAYS Performed at [facility], [place] [address], [place], [place] [address]    Report Status PENDING  Incomplete  Urine Culture     Status: None   Collection Time: 01/29/21  3:56 PM   Specimen: Urine, Catheterized  Result Value Ref Range Status   Specimen Description URINE, CATHETERIZED  Final   Special Requests NONE  Final   Culture   Final    NO GROWTH Performed at [facility], [address] [place], [place] [address]    Report Status 01/30/2021 FINAL  Final  Culture, Respiratory w Gram Stain     Status: None   Collection Time: 01/30/21 11:18 AM   Specimen: Tracheal Aspirate; Respiratory  Result Value Ref Range Status   Specimen Description TRACHEAL ASPIRATE  Final   Special Requests NONE  Final   Gram Stain   Final    ABUNDANT WBC PRESENT,BOTH PMN AND MONONUCLEAR MODERATE GRAM POSITIVE COCCI RARE GRAM NEGATIVE RODS RARE GRAM VARIABLE ROD    Culture   Final    ABUNDANT SERRATIA MARCESCENS FEW KLEBSIELLA PNEUMONIAE MODERATE STREPTOCOCCUS GROUP F Beta hemolytic streptococci are predictably susceptible to penicillin and other beta lactams. Susceptibility testing not routinely performed. Performed at [facility], [place] [address] [place], [place] [address]    Report Status  02/02/2021 FINAL  Final   Organism ID, Bacteria SERRATIA MARCESCENS  Final   Organism ID, Bacteria KLEBSIELLA PNEUMONIAE  Final      Susceptibility   Klebsiella pneumoniae - MIC*    AMPICILLIN >=32 RESISTANT Resistant     CEFAZOLIN <=4 SENSITIVE Sensitive     CEFEPIME <=0.12 SENSITIVE Sensitive     CEFTAZIDIME <=1 SENSITIVE Sensitive     CEFTRIAXONE <=0.25 SENSITIVE Sensitive     CIPROFLOXACIN <=0.25 SENSITIVE Sensitive  GENTAMICIN <=1 SENSITIVE Sensitive     IMIPENEM <=0.25 SENSITIVE Sensitive     TRIMETH/SULFA <=20 SENSITIVE Sensitive     AMPICILLIN/SULBACTAM 8 SENSITIVE Sensitive     PIP/TAZO <=4 SENSITIVE Sensitive     * FEW KLEBSIELLA PNEUMONIAE   Serratia marcescens - MIC*    CEFAZOLIN >=64 RESISTANT Resistant     CEFEPIME <=0.12 SENSITIVE Sensitive     CEFTAZIDIME <=1 SENSITIVE Sensitive     CEFTRIAXONE <=0.25 SENSITIVE Sensitive     CIPROFLOXACIN <=0.25 SENSITIVE Sensitive     GENTAMICIN <=1 SENSITIVE Sensitive     TRIMETH/SULFA <=20 SENSITIVE Sensitive     * ABUNDANT SERRATIA MARCESCENS     Studies: No results found.  Scheduled Meds:  baclofen  5 mg Per Tube TID AC & HS   bethanechol  25 mg Per Tube TID   carvedilol  25 mg Per Tube BID WC   chlorhexidine gluconate (MEDLINE KIT)  15 mL Mouth Rinse BID   Chlorhexidine Gluconate Cloth  6 each Topical Q0600   cloNIDine  0.1 mg Per Tube Q8H   fiber  1 packet Per Tube BID   free water  100 mL Per Tube Q6H   heparin injection (subcutaneous)  5,000 Units Subcutaneous Q8H   hydrALAZINE  25 mg Per Tube Q8H   insulin aspart  0-20 Units Subcutaneous Q4H   insulin detemir  20 Units Subcutaneous BID   mouth rinse  15 mL Mouth Rinse 10 times per day   nutrition supplement (JUVEN)  1 packet Per Tube BID BM   pantoprazole sodium  40 mg Per Tube Daily   polyethylene glycol  17 g Per Tube Daily   scopolamine  1 patch Transdermal Q72H   Continuous Infusions:  ceFEPime (MAXIPIME) IV 2 g (02/02/21 2100)   feeding supplement  (GLUCERNA 1.5 CAL) 1,000 mL (02/02/21 1316)    Principal Problem:   ICH (intracerebral hemorrhage) ([place]) Active Problems:   Malignant hypertension   Diabetes mellitus type 2 in obese ([place])   Acute respiratory failure ([place])   Tracheostomy dependence ([place])   Pneumonia due to Serratia marcescens ([place])   MSSA (methicillin susceptible Staphylococcus aureus) pneumonia ([place])   Stage 3b chronic kidney disease (CKD) ([place])   Acute kidney injury ([place])   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 ([place])   Acute hypernatremia   Dysphagia due to recent stroke   Acute urinary retention   Respiration abnormal   Pressure injury of skin   Abdominal distention   Intractable hiccups   Pneumonia due to Klebsiella pneumoniae ([place])   Swollen lip   Fever   Muscular hypertonicity   Consultants: Neurology PCCM Medicine  Procedures: Echocardiogram EEG Cortrack PEG tube placement Tracheostomy tube placement  Antibiotics: Zosyn 12/15 through 12/21 Cefepime 12/24 through 12/27 Bactrim 12/27 >>   Time spent: 45 minutes    Erin Hearing ANP  [facility] 7 am - 330 pm/M-F for direct patient care and secure chat Please refer to Amion for contact info 35  days

## 2021-02-04 LAB — GLUCOSE, CAPILLARY
Glucose-Capillary: 164 mg/dL — ABNORMAL HIGH (ref 70–99)
Glucose-Capillary: 159 mg/dL — ABNORMAL HIGH (ref 70–99)
Glucose-Capillary: 134 mg/dL — ABNORMAL HIGH (ref 70–99)
Glucose-Capillary: 150 mg/dL — ABNORMAL HIGH (ref 70–99)
Glucose-Capillary: 143 mg/dL — ABNORMAL HIGH (ref 70–99)

## 2021-02-04 MED ORDER — OLANZAPINE 2.5 MG PO TABS
5.0000 mg | ORAL_TABLET | Freq: Every day | ORAL | Status: DC
  Administered 2021-02-04 – 2021-02-24 (×21): 5 mg
  Filled 2021-02-04 (×21): qty 2

## 2021-02-04 MED ORDER — OXYCODONE HCL 5 MG/5ML PO SOLN
2.5000 mg | Freq: Three times a day (TID) | ORAL | Status: DC
  Administered 2021-02-04 – 2021-03-22 (×140): 2.5 mg
  Filled 2021-02-04 (×140): qty 5

## 2021-02-04 MED ORDER — HYDRALAZINE HCL 50 MG PO TABS
50.0000 mg | ORAL_TABLET | Freq: Three times a day (TID) | ORAL | Status: DC
  Administered 2021-02-04 – 2021-02-13 (×27): 50 mg
  Filled 2021-02-04 (×27): qty 1

## 2021-02-04 NOTE — Progress Notes (Signed)
Occupational Therapy Treatment Patient Details Name: Stephen Delgado MRN: [id number] DOB: 02-17-1965 Today's Date: 02/04/2021   History of present illness 56 y/o presented to [facility] ED on 11/29 for AMS, lethargy, and vomiting. Transferred to [facility]. CTH showed L caudate/BG hemorrhage with IVH. Declining respiratory status with snoring respirations. Intubated 12/3. Trach placed 12/13.on 12/16, repeat MRI showed an increase in the size and number of acute infarcts in the bilateral hemispheres with increased involvement in the cerebellum bilaterally. On 12/23, pt transferred to ICU for worsening respiratory status due to tracheobronchitis, requiring short term mechanical ventilation. PMH: HTN, sleep apnea, DM, CKD 3   OT comments  Pt continues to demonstrate no response to noxious stimuli, even with PROM and rolling in bed. PROM and positioning to address edema. OT will continue to follow acutely to address participation/command following.    Recommendations for follow up therapy are one component of a multi-disciplinary discharge planning process, led by the attending physician.  Recommendations may be updated based on patient status, additional functional criteria and insurance authorization.    Follow Up Recommendations  Skilled nursing-short term rehab (<3 hours/day)    Assistance Recommended at Discharge Frequent or constant Supervision/Assistance  Patient can return home with the following      Equipment Recommendations  Other (comment)    Recommendations for Other Services Other (comment)    Precautions / Restrictions Precautions Precautions: Fall Precaution Comments: G-tube, trach collar, flexiseal, foley, BUE/pedal edema Restrictions Weight Bearing Restrictions: No       Mobility Bed Mobility Overal bed mobility: Needs Assistance Bed Mobility: Rolling Rolling: Total assist;+2 for physical assistance;+2 for safety/equipment         General bed mobility comments: attempted to  roll to reposition, and change bed pads, requiring total A +2 for partial rolls to each side    Transfers                         Balance                                           ADL either performed or assessed with clinical judgement   ADL Overall ADL's : Needs assistance/impaired                                       General ADL Comments: total A    Extremity/Trunk Assessment              Vision       Perception     Praxis      Cognition Arousal/Alertness: Lethargic Behavior During Therapy: Flat affect Overall Cognitive Status: Difficult to assess                                 General Comments: Pt opened his eyes x2 with PROM of BUE, quickly shuts them back          Exercises Exercises: Other exercises Other Exercises Other Exercises: retro grade massage to BUE hands due to edema Other Exercises: B UE elevated to reduce dependent edema Other Exercises: BUE PROM: Wrist ext/flex, elbow ext/flex, shoulder flex/abduction, straightening all digits   Shoulder Instructions       General Comments VSS  Pertinent Vitals/ Pain       Pain Assessment: PAINAD Breathing: normal Negative Vocalization: none Facial Expression: smiling or inexpressive Body Language: relaxed Consolability: no need to console PAINAD Score: 0  Home Living                                          Prior Functioning/Environment              Frequency  Min 1X/week        Progress Toward Goals  OT Goals(current goals can now be found in the care plan section)  Progress towards OT goals: Not progressing toward goals - comment  Acute Rehab OT Goals Patient Stated Goal: Unable to state OT Goal Formulation: Patient unable to participate in goal setting Time For Goal Achievement: 02/18/21 Potential to Achieve Goals: Poor ADL Goals Additional ADL Goal #1: Pt will follow 1 step commands 1/4  times as precursor for ADL tasks  Plan Discharge plan remains appropriate    Co-evaluation                 AM-PAC OT "6 Clicks" Daily Activity     Outcome Measure   Help from another person eating meals?: Total Help from another person taking care of personal grooming?: Total Help from another person toileting, which includes using toliet, bedpan, or urinal?: Total Help from another person bathing (including washing, rinsing, drying)?: Total Help from another person to put on and taking off regular upper body clothing?: Total Help from another person to put on and taking off regular lower body clothing?: Total 6 Click Score: 6    End of Session Equipment Utilized During Treatment: Oxygen  OT Visit Diagnosis: Other abnormalities of gait and mobility (R26.89);Muscle weakness (generalized) (M62.81);Low vision, both eyes (H54.2);Other symptoms and signs involving the nervous system (R29.898);Other symptoms and signs involving cognitive function;Cognitive communication deficit (R41.841) Symptoms and signs involving cognitive functions: Nontraumatic SAH;Other Nontraumatic ICH   Activity Tolerance Patient limited by lethargy   Patient Left in bed;with call bell/phone within reach;with bed alarm set   Nurse Communication Other (comment) (level of arousal)        Time: 1137-1150 OT Time Calculation (min): 13 min  Charges: OT General Charges $OT Visit: 1 Visit OT Treatments $Therapeutic Activity: 8-22 mins  Amaiah Cristiano H., OTR/L Acute Rehabilitation  Regine Christian Elane Meta Kroenke 02/04/2021, 4:53 PM

## 2021-02-04 NOTE — Progress Notes (Signed)
While giving patient a bath with Mimi-NA's help, upon asking him " where is my beautiful smile" Patient smile, I asked him again and he smile again. And asked him if you can here me please blink for me, he close his eyes, took long to open back his eyes but seems like he was trying to blink.

## 2021-02-04 NOTE — Progress Notes (Signed)
°   02/04/21 0640  Assess: MEWS Score  Temp 99 F (37.2 C)  BP (!) 157/86  ECG Heart Rate (!) 104  Resp 12  SpO2 98 %  O2 Device Tracheostomy Collar  Patient Activity (if Appropriate) In bed  FiO2 (%) 28 %  Assess: MEWS Score  MEWS Temp 0  MEWS Systolic 0  MEWS Pulse 1  MEWS RR 1  MEWS LOC 1  MEWS Score 3  MEWS Score Color Yellow  Assess: if the MEWS score is Yellow or Red  Were vital signs taken at a resting state? Yes  Focused Assessment Change from prior assessment (see assessment flowsheet)  Treat  MEWS Interventions Escalated (See documentation below)  Pain Scale PAINAD  Pain Score 0  Faces Pain Scale 0  Escalate  MEWS: Escalate Yellow: discuss with charge nurse/RN and consider discussing with provider and RRT  Notify: Charge Nurse/RN  Name of Charge Nurse/RN Notified Philomena, RN  Date Charge Nurse/RN Notified 02/04/21  Time Charge Nurse/RN Notified [phone]

## 2021-02-04 NOTE — Progress Notes (Addendum)
[facility] PROGRESS NOTE  Dom Haverland Mazzola BSW:[id number] DOB: Nov 26, 1965 DOA: 12/30/2020 PCP: Patient, [name]   01/29/2021    01/29/2021                 02/04/2020  Status: Remains inpatient appropriate because:  Unsafe discharge plan.  Patient in persistent vegetative state per stroke needs placement at skilled nursing facility that is trach capable  Barriers to discharge: Social: Current insurance coverage about to expire and he otherwise has no funding for long-term care.  Clarifying regarding application for disability and Medicaid  Clinical: Tracheostomy and PEG dependent with recurrent tracheobronchitis; remains a full code  Level of care:  Med-Surg but transferring back to ICU for mechanical ventilation and stat CT of head due to neurological change as of 12/23   Code Status: Full code Family Communication: Wife at bedside 12/23 DVT prophylaxis: SCDs COVID vaccination status: Unknown  Foley catheter: 59 French inserted on 12/17; changed on 12/24 POA: No  HPI: 56 y.o. male with PMH significant for DM2, HTN, CKD with questionable compliance to medications Patient presented to the ED on 12/30/2020 with complaint of headache, altered mental status   In the ED, he was hypertensive to 238/160, agitated, required restraints CT head showed large amount of intraventricular hemorrhage involving lateral/third/fourth ventricle, ICH and left basal ganglia as well as chronic microvascular ischemic changes. He was started on Cleviprex drip and admitted to neuro ICU   He subsequently had CT head repeated on 11/30, 12/1 and 12/3, all demonstrated unchanged intraparenchymal hemorrhage and intraventricular hemorrhage 12/3, MRI brain from also showed numerous small acute infarcts in both cerebral hemisphere with minimal improvement in the right superior cerebellum 12/3, intubated 12/13, tracheostomy 12/15, wean from vent to trach collar 12/16, PEG tube  placement 12/16, repeat MRI showed an increase in the size and number of acute infarcts in the bilateral hemispheres with increased involvement in the cerebellum bilaterally.   12/18, transferred from neuro service to [facility]  12/21, palliative care consulted.  Family wanted to continue aggressive care as full CODE STATUS. 12/23 developed Cheyne-Stokes respiratory pattern with respiratory alkalosis.  Transferred to ICU for short-term mechanical ventilation and was found to have new tracheobronchitis now on cefepime 12/25 back to progressive floor and to [facility]   His hospital course has been complicated by significant neurological impairment, aspiration events, AKI, hypernatremia, urinary retention.  Subjective: Essentially unresponsive but did open eyes briefly to voice.  Did not make any definitive eye contact and closes eyes quickly.  Objective: Vitals:   02/04/21 0731 02/04/21 0745  BP: (!) 157/86 (!) 154/83  Pulse: 100 (!) 105  Resp: 12 20  Temp:    SpO2: 96% 98%    Intake/Output Summary (Last 24 hours) at 02/04/2021 0749 Last data filed at 02/04/2021 0600 Gross per 24 hour  Intake 100 ml  Output 1850 ml  Net -1750 ml   Filed Weights   01/28/21 0408 01/29/21 0500 01/30/21 0500  Weight: 87.7 kg 87.5 kg 88.9 kg    Exam:  Constitutional: Remains unresponsive, in no acute distress Respiratory: #8.0 Shiley cuffed trach, no further apnea. Bilateral fine expiratory rhonchi, trach dressing clean and dry and no secretions noted Cardiovascular: S1-S2, NSR, normotensive, no bilateral lower extremity edema  Abdomen: no tenderness, no masses palpated.  Abdomen soft.  PEG tube -hypoactive bowel sounds positive. rectal Foley in place with liquid brown drainage Genitourinary: Foley catheter in place draining clear yellow urine to bedside bag Neurologic: Unable to accurately  test cranial nerves.  Eyes closed.  No blink reflex when tested.  Appears to have a more normal respiratory pattern today.   Upper extremities are flaccid to PROM.  Bilateral lower extremities less hypertonic with improved flexion with PROM bilaterally. Psychiatric: Unresponsive   Assessment/Plan: Acute problems: Acute intraparenchymal and intraventricular hemorrhage Bilateral multifocal ischemic infarcts -Imaging and admission reviewed ischemic and hemorrhagic stroke likely from combination of uncontrolled hypertension and small vessel disease.  -no improvement in multiple repeat imagings.  Follow-up MRI on 12/16 demonstrated increase in size and number of acute infarcts  -Neurology has signed off as of 12/22 as they have nothing further to offer.  Currently on no blood thinners because of intracranial hemorrhage. -Baseline has episodes of [place] and Cheyne-Stokes respiratory patterns alternating with normal respiratory pattern -1/2 started Baclofen for hypertonicity tx with improvement in bilateral lower extremity hypertonicity -pt with intermittent episodes of HTN/tachycardia that could indicate underlying pain for as of 1/4 very low-dose Oxy IR added for pain -Add low-dose Zyprexa at HS to treat sequela of significant brain injury  Swollen lip -Resolved -concern for unwitnessed seizure -EEG consistent with moderate diffuse encephalopathy without evidence of seizures or epileptiform discharges.  CK 176 -Neurology consulted and no indications for LTM or AED  Acute respiratory failure with hypoxia 2/2 recurrent Serratia tracheobronchitis (recent MSSA and serratia pneumonia; recent Klebsiella tracheobronchitis); -Completed Zosyn on 12/21 -12/23  acute tracheobronchitis.  Required short-term mechanical ventilation -12/28 After consultation with pharmacist de-escalated to Bactrim but given recurrent fevers and increased and pulmonary secretions pulmonary medicine recommended changing to Cefepime and obtaining sputum culture -12/30 Rpt resp cx c/w abundant Serratia and minimal amount of Klebsiella-sensitivities  demonstrate both organisms sensitive to cefepime so we will can continue for now.  At this juncture given recent emergence of Serratia dominant pneumonia after de-escalating antibiotics would likely not do so early in course of treatment -ESR 57 down from 78 on 12/29  Bilateral lower extremity edema -Diuresed 7850 cc UOP after one-time dose of 40 mg IV Lasix -BUN 44 with creatinine 1.16 which is stable  Essential hypertension/EKG with LVH strain -Blood pressure on admission was elevated to over 144 systolic. -SBP  128-163 as of 1/4 -Continue max dose Coreg and clonidine; 1/4 increase hydralazine -Has IV Apresoline and Normodyne available prn -Suspect hypertonicity and associated pain contributing-see above regarding treatment  Mild abdominal distention/?  Constipation -Recent imaging unremarkable -Continue scheduled MiraLAX   AKI on CKD 3b -Baseline renal function: January 2022 BUN was 18 and creatinine 1.49 -Current creatinine 1.18 with a BUN of 37   Hypernatremia Resolved -Na+ peak 154 with current reading 140  Non obstructive transaminitis -Recurrent issue throughout hospitalization -Peak AST 163 and ALT 133 on 12/14.  On 12/16 had decreased to 58 and 76. -Today on 12/29 AST 43 and ALT 79 -CT abdomen and pelvis 12/23 unremarkable no evidence of cirrhosis or other biliary tree issues -Follow closely on Tylenol; carvedilol can cause ALT or AST elevation as well -Hep C antibody within normal limit -12/29 hepatitis panel unremarkable   Type 2 diabetes mellitus -A1c 6.3 in 12/31/2020 -Continue Levemir 20 units BID along with SSI every 4 hours -CBGs well controlled   Protein calorie malnutrition/dysphagia Nutrition Problem: Inadequate oral intake Etiology: lethargy/confusion, dysphagia Signs/Symptoms: NPO status Interventions: Tube feeding via PEG Body mass index is 26.58 kg/m.    Acute urinary retention  -Continue Urecholine -Currently has Foley catheter.  Last  exchanged on 12/23 -Once has recovered from current episode of tracheobronchitis  can begin Foley clamping trials to see if can remove Foley   Goals of care -Poor prognosis.  No neurological recovery in last several days.  In fact repeat MRI on 12/16 shows worsening stroke compared to scan on admission.  -Multiple discussions held with family per pulmonary medicine team as well as palliative medicine. They want to continue aggressive care with FULL CODE STATUS at this time.         Data Reviewed: Basic Metabolic Panel: Recent Labs  Lab 01/29/21 1009 01/31/21 0202 02/02/21 0232 02/03/21 0751  NA 138 135 140 138  K 4.1 4.1 4.3 4.0  CL 109 105 106 105  CO2 20* 21* 24 25  GLUCOSE 153* 139* 128* 139*  BUN 39* 40* 37* 44*  CREATININE 1.57* 1.52* 1.18 1.16  CALCIUM 9.1 8.8* 9.3 8.9  MG  --   --  2.2  --     CBC: Recent Labs  Lab 01/29/21 1009 01/31/21 0202 02/02/21 0232 02/03/21 0751  WBC 8.4 8.9 7.7 8.3  NEUTROABS 6.0 5.7 5.0 5.4  HGB 10.2* 9.9* 10.6* 10.6*  HCT 31.9* 31.2* 33.2* 32.8*  MCV 74.9* 75.0* 75.5* 75.2*  PLT 299 275 264 262     CBG: Recent Labs  Lab 02/03/21 1216 02/03/21 1610 02/03/21 2031 02/03/21 2347 02/04/21 0357  GLUCAP 178* 146* 148* 148* 164*    Recent Results (from the past 240 hour(s))  Culture, blood (routine x 2)     Status: None   Collection Time: 01/29/21 10:09 AM   Specimen: Left Antecubital; Blood  Result Value Ref Range Status   Specimen Description LEFT ANTECUBITAL  Final   Special Requests   Final    BOTTLES DRAWN AEROBIC AND ANAEROBIC Blood Culture adequate volume   Culture   Final    NO GROWTH 5 DAYS Performed at [facility], [place] [address] [place], [place] [address]    Report Status 02/03/2021 FINAL  Final  Culture, blood (routine x 2)     Status: None   Collection Time: 01/29/21 10:31 AM   Specimen: BLOOD LEFT HAND  Result Value Ref Range Status   Specimen Description BLOOD LEFT HAND  Final   Special  Requests   Final    BOTTLES DRAWN AEROBIC AND ANAEROBIC Blood Culture adequate volume   Culture   Final    NO GROWTH 5 DAYS Performed at [facility], [place] [address], [place], [place] [address]    Report Status 02/03/2021 FINAL  Final  Urine Culture     Status: None   Collection Time: 01/29/21  3:56 PM   Specimen: Urine, Catheterized  Result Value Ref Range Status   Specimen Description URINE, CATHETERIZED  Final   Special Requests NONE  Final   Culture   Final    NO GROWTH Performed at [facility], [place] [address], [place], [place] [address]    Report Status 01/30/2021 FINAL  Final  Culture, Respiratory w Gram Stain     Status: None   Collection Time: 01/30/21 11:18 AM   Specimen: Tracheal Aspirate; Respiratory  Result Value Ref Range Status   Specimen Description TRACHEAL ASPIRATE  Final   Special Requests NONE  Final   Gram Stain   Final    ABUNDANT WBC PRESENT,BOTH PMN AND MONONUCLEAR MODERATE GRAM POSITIVE COCCI RARE GRAM NEGATIVE RODS RARE GRAM VARIABLE ROD    Culture   Final    ABUNDANT SERRATIA MARCESCENS FEW KLEBSIELLA PNEUMONIAE MODERATE STREPTOCOCCUS GROUP F  Beta hemolytic streptococci are predictably susceptible to penicillin and other beta lactams. Susceptibility testing not routinely performed. Performed at [facility], [place] [address] [place], [place] [address]    Report Status 02/02/2021 FINAL  Final   Organism ID, Bacteria SERRATIA MARCESCENS  Final   Organism ID, Bacteria KLEBSIELLA PNEUMONIAE  Final      Susceptibility   Klebsiella pneumoniae - MIC*    AMPICILLIN >=32 RESISTANT Resistant     CEFAZOLIN <=4 SENSITIVE Sensitive     CEFEPIME <=0.12 SENSITIVE Sensitive     CEFTAZIDIME <=1 SENSITIVE Sensitive     CEFTRIAXONE <=0.25 SENSITIVE Sensitive     CIPROFLOXACIN <=0.25 SENSITIVE Sensitive     GENTAMICIN <=1 SENSITIVE Sensitive     IMIPENEM <=0.25 SENSITIVE Sensitive     TRIMETH/SULFA <=20 SENSITIVE Sensitive      AMPICILLIN/SULBACTAM 8 SENSITIVE Sensitive     PIP/TAZO <=4 SENSITIVE Sensitive     * FEW KLEBSIELLA PNEUMONIAE   Serratia marcescens - MIC*    CEFAZOLIN >=64 RESISTANT Resistant     CEFEPIME <=0.12 SENSITIVE Sensitive     CEFTAZIDIME <=1 SENSITIVE Sensitive     CEFTRIAXONE <=0.25 SENSITIVE Sensitive     CIPROFLOXACIN <=0.25 SENSITIVE Sensitive     GENTAMICIN <=1 SENSITIVE Sensitive     TRIMETH/SULFA <=20 SENSITIVE Sensitive     * ABUNDANT SERRATIA MARCESCENS     Studies: No results found.  Scheduled Meds:  baclofen  5 mg Per Tube TID AC & HS   bethanechol  25 mg Per Tube TID   carvedilol  25 mg Per Tube BID WC   chlorhexidine gluconate (MEDLINE KIT)  15 mL Mouth Rinse BID   Chlorhexidine Gluconate Cloth  6 each Topical Q0600   cloNIDine  0.1 mg Per Tube Q8H   fiber  1 packet Per Tube BID   free water  100 mL Per Tube Q6H   heparin injection (subcutaneous)  5,000 Units Subcutaneous Q8H   hydrALAZINE  25 mg Per Tube Q8H   insulin aspart  0-20 Units Subcutaneous Q4H   insulin detemir  20 Units Subcutaneous BID   mouth rinse  15 mL Mouth Rinse 10 times per day   nutrition supplement (JUVEN)  1 packet Per Tube BID BM   pantoprazole sodium  40 mg Per Tube Daily   polyethylene glycol  17 g Per Tube Daily   scopolamine  1 patch Transdermal Q72H   Continuous Infusions:  ceFEPime (MAXIPIME) IV 2 g (02/04/21 0610)   feeding supplement (GLUCERNA 1.5 CAL) 1,000 mL (02/02/21 1316)    Principal Problem:   ICH (intracerebral hemorrhage) ([place]) Active Problems:   Malignant hypertension   Diabetes mellitus type 2 in obese ([place])   Acute respiratory failure ([place])   Tracheostomy dependence ([place])   Pneumonia due to Serratia marcescens ([place])   MSSA (methicillin susceptible Staphylococcus aureus) pneumonia ([place])   Stage 3b chronic kidney disease (CKD) ([place])   Acute kidney injury ([place])   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 ([place])   Acute hypernatremia    Dysphagia due to recent stroke   Acute urinary retention   Respiration abnormal   Pressure injury of skin   Abdominal distention   Intractable hiccups   Pneumonia due to Klebsiella pneumoniae ([place])   Swollen lip   Fever   Muscular hypertonicity   Consultants: Neurology PCCM Medicine  Procedures: Echocardiogram EEG Cortrack PEG tube placement Tracheostomy tube placement  Antibiotics: Zosyn 12/15 through 12/21 Cefepime 12/24 through 12/27  Bactrim 12/27 >>   Time spent: 45 minutes    Erin Hearing ANP  [facility] 7 am - 330 pm/M-F for direct patient care and secure chat Please refer to Amion for contact info 36  days

## 2021-02-05 LAB — GLUCOSE, CAPILLARY
Glucose-Capillary: 140 mg/dL — ABNORMAL HIGH (ref 70–99)
Glucose-Capillary: 141 mg/dL — ABNORMAL HIGH (ref 70–99)
Glucose-Capillary: 142 mg/dL — ABNORMAL HIGH (ref 70–99)
Glucose-Capillary: 145 mg/dL — ABNORMAL HIGH (ref 70–99)
Glucose-Capillary: 150 mg/dL — ABNORMAL HIGH (ref 70–99)
Glucose-Capillary: 153 mg/dL — ABNORMAL HIGH (ref 70–99)
Glucose-Capillary: 158 mg/dL — ABNORMAL HIGH (ref 70–99)

## 2021-02-05 MED ORDER — AMANTADINE HCL 50 MG/5ML PO SOLN
100.0000 mg | Freq: Two times a day (BID) | ORAL | Status: DC
  Administered 2021-02-05 – 2021-04-29 (×168): 100 mg
  Filled 2021-02-05 (×173): qty 10

## 2021-02-05 NOTE — TOC Progression Note (Addendum)
Transition of Care (TOC) - Progression Note  ° ° °Patient Details  °Name: Stephen Delgado °MRN: [id number] °Date of Birth: 03/30/1965 ° °Transition of Care (TOC) CM/SW Contact  ° R Stubbldfield, RN °Phone Number: °02/05/2021, 9:24 AM ° °Clinical Narrative:    °CM called and left a voicemail message with the patient's wife and son, Joseph Dorman to obtain updated insurance coverage information for needed SNF placement for the patient. ° °CM and MSW with DTP Team will continue to follow the patient for placement. ° °02/05/2021 1152 - CM spoke with Joseph Spindle on the phone in depth about discussion regarding patient's plan of care.  The patient's son states that he does not believe that the patient's wife has signed the patient up for healthcare coverage for this year not that the [employer] insurance coverage has likely ended.  I spoke with the patient and plan to meet with him in person to provide assistance in having the family apply for healthcare coverage likely under the wife's insurance plan along with application for Medicaid.  Financial counseling has been notified to reach out to the family for Medicaid application and assistance.  The patient will need SNF placement and likely LTC placement due to patient's extensive nursing care needs.  The patient's son states that the members of the family living in the home at this time are unable to provide 24 hour care to the patient. ° °CM and MSW with DTP Team continue to follow the patient for SNF placement. ° °02/05/2021 1400 - CM met with the patient's son in person to discuss patient's need for insurance coverage for SNF placement.  The son states that he will speak to the patient's wife about placing the patient on her current UMR insurance at [place] and assisting financial counseling with Medicaid application.  The patient's son states that he is aware that the patient will need SNF placement and likely LTC placement.  The son also states that the  patient has receives COVID vaccines in the past and will get a copy to place on the chart. ° °The patient's son is aware and CM and MSW will continue to explore nursing home options for placement. ° ° °Expected Discharge Plan: Skilled Nursing Facility °Barriers to Discharge: Continued Medical Work up ° °Expected Discharge Plan and Services °Expected Discharge Plan: Skilled Nursing Facility °  °Discharge Planning Services: CM Consult °Post Acute Care Choice: Skilled Nursing Facility °Living arrangements for the past 2 months: Single Family Home °                °  °  °  °  °  °  °  °  °  °  ° ° °Social Determinants of Health (SDOH) Interventions °  ° °Readmission Risk Interventions °No flowsheet data found. ° °

## 2021-02-05 NOTE — Progress Notes (Addendum)
[facility] PROGRESS NOTE  Alfonse Garringer Urizar VHQ:[id number] DOB: 1965-03-23 DOA: 12/30/2020 PCP: Patient, [name]   01/29/2021    01/29/2021                 02/04/2020  Status: Remains inpatient appropriate because:  Unsafe discharge plan.  Patient in persistent vegetative state per stroke needs placement at skilled nursing facility that is trach capable  Barriers to discharge: Social: Current insurance coverage about to expire and he otherwise has no funding for long-term care.  Clarifying regarding application for disability and Medicaid  Clinical: Tracheostomy and PEG dependent with recurrent tracheobronchitis; remains a full code  Level of care:  Med-Surg but transferring back to ICU for mechanical ventilation and stat CT of head due to neurological change as of 12/23   Code Status: Full code Family Communication: Wife at bedside 12/23; son on 1/5.  Updated on clinical changes including introduction of medications to treat brain injury sequela and hypertonicity.  Introduced Teacher, adult education of DNR to son and stated this should be considered by family but a decision was not required today since his father was otherwise medically stable.  Son aware that myself and palliative medicine team would like to meet with son and hopefully mother next week DVT prophylaxis: SCDs COVID vaccination status: Unknown  Foley catheter: 66 French inserted on 12/17; changed on 12/24 POA: No  HPI: 56 y.o. male with PMH significant for DM2, HTN, CKD with questionable compliance to medications Patient presented to the ED on 12/30/2020 with complaint of headache, altered mental status   In the ED, he was hypertensive to 238/160, agitated, required restraints CT head showed large amount of intraventricular hemorrhage involving lateral/third/fourth ventricle, ICH and left basal ganglia as well as chronic microvascular ischemic changes. He was started on Cleviprex drip and admitted to neuro  ICU   He subsequently had CT head repeated on 11/30, 12/1 and 12/3, all demonstrated unchanged intraparenchymal hemorrhage and intraventricular hemorrhage 12/3, MRI brain from also showed numerous small acute infarcts in both cerebral hemisphere with minimal improvement in the right superior cerebellum 12/3, intubated 12/13, tracheostomy 12/15, wean from vent to trach collar 12/16, PEG tube placement 12/16, repeat MRI showed an increase in the size and number of acute infarcts in the bilateral hemispheres with increased involvement in the cerebellum bilaterally.   12/18, transferred from neuro service to [facility]  12/21, palliative care consulted.  Family wanted to continue aggressive care as full CODE STATUS. 12/23 developed Cheyne-Stokes respiratory pattern with respiratory alkalosis.  Transferred to ICU for short-term mechanical ventilation and was found to have new tracheobronchitis now on cefepime 12/25 back to progressive floor and to [facility]   His hospital course has been complicated by significant neurological impairment, aspiration events, AKI, hypernatremia, urinary retention.  Subjective: Unresponsive and did not open eyes to voice or tactile stimulation today.  When I opened his eyes they were deviated downward and no blink reflex was noted.  Objective: Vitals:   02/05/21 0345 02/05/21 0727  BP: (!) 134/96 133/75  Pulse:    Resp: 14 17  Temp: 99.7 F (37.6 C)   SpO2: 96% 95%    Intake/Output Summary (Last 24 hours) at 02/05/2021 0812 Last data filed at 02/04/2021 2345 Gross per 24 hour  Intake 60 ml  Output 1900 ml  Net -1840 ml   Filed Weights   01/29/21 0500 01/30/21 0500 02/05/21 0421  Weight: 87.5 kg 88.9 kg 89 kg    Exam:  Constitutional: Remains  unresponsive, in no acute distress Respiratory: #8.0 Shiley cuffed trach, no further apnea.  Lung sounds more coarse but clear without rhonchi today, trach dressing with some thick grayish-tan secretions noted on  dressing Cardiovascular: S1-S2, NSR, normotensive, no bilateral lower extremity edema -no further tachycardia Abdomen: no tenderness, no masses palpated.  Abdomen soft.  PEG tube -normoactive bowels rectal Foley in place with liquid brown drainage Genitourinary: Foley catheter in place draining clear yellow urine to bedside bag Neurologic: Unable to accurately test cranial nerves.  Eyes closed.  No blink reflex when tested.  Appears to have a more normal respiratory pattern today.  Upper extremities are flaccid to PROM.  Bilateral lower extremities less hypertonic with improved flexion with PROM bilaterally. Psychiatric: Unresponsive   Assessment/Plan: Acute problems: Acute intraparenchymal and intraventricular hemorrhage Bilateral multifocal ischemic infarcts -Imaging and admission reviewed ischemic and hemorrhagic stroke likely from combination of uncontrolled hypertension and small vessel disease.  -no improvement in multiple repeat imagings.  Follow-up MRI on 12/16 demonstrated increase in size and number of acute infarcts  -Neurology has signed off as of 12/22 as they have nothing further to offer.  Currently on no blood thinners because of intracranial hemorrhage. -Baseline has episodes of [place] and Cheyne-Stokes respiratory patterns alternating with normal respiratory pattern - continue baclofen for hypertonicity and low-dose scheduled Oxy IR for suspected underlying pain  -1/5 add Symmetrel -Continue low-dose Zyprexa at HS to treat sequela of significant brain injury  Swollen lip -Resolved -concern for unwitnessed seizure -EEG consistent with moderate diffuse encephalopathy without evidence of seizures or epileptiform discharges.  CK 176 -Neurology consulted and no indications for LTM or AED  Acute respiratory failure with hypoxia 2/2 recurrent Serratia tracheobronchitis (recent MSSA and serratia pneumonia; recent Klebsiella tracheobronchitis); -Completed Zosyn on 12/21 -12/23   acute tracheobronchitis.  Required short-term mechanical ventilation -12/28 After consultation with pharmacist de-escalated to Bactrim but given recurrent fevers and increased and pulmonary secretions pulmonary medicine recommended changing to Cefepime and obtaining sputum culture -12/30 Rpt resp cx c/w abundant Serratia and minimal amount of Klebsiella-sensitivities demonstrate both organisms sensitive to cefepime which we will continue for total of 7 days  Bilateral lower extremity edema -Diuresed 7850 cc UOP after one-time dose of 40 mg IV Lasix -BUN 44 with creatinine 1.16 which is stable  Essential hypertension/EKG with LVH strain -Blood pressure on admission was elevated to over 016 systolic. -SBP  128-163 as of 1/4 -Continue max dose Coreg and clonidine; 1/4 increased hydralazine with significant improvement in BP reading -Has IV Apresoline and Normodyne available prn -Suspect hypertonicity and associated pain contributing-see above regarding treatment  Mild abdominal distention/?  Constipation -Recent imaging unremarkable -Continue scheduled MiraLAX   AKI on CKD 3b -Baseline renal function: January 2022 BUN was 18 and creatinine 1.49 -Current creatinine 1.18 with a BUN of 37   Hypernatremia Resolved -Na+ peak 154 with current reading 140  Non obstructive transaminitis -Recurrent issue throughout hospitalization -CT abdomen and pelvis 12/23 unremarkable no evidence of cirrhosis or other biliary tree issues -Follow closely on Tylenol -Hep C antibody within normal limit -12/29 hepatitis panel unremarkable   Type 2 diabetes mellitus -A1c 6.3 in 12/31/2020 -Continue Levemir 20 units BID along with SSI every 4 hours -CBGs well controlled   Protein calorie malnutrition/dysphagia Nutrition Problem: Inadequate oral intake Etiology: lethargy/confusion, dysphagia Signs/Symptoms: NPO status Interventions: Tube feeding via PEG Body mass index is 26.61 kg/m.    Acute urinary  retention  -Continue Urecholine -Currently has Foley catheter.  Last exchanged on  12/23.  As of 1/5 plan is to clamp catheter for 4 hours alternating with unclamping   Goals of care -Poor prognosis.  No neurological recovery in last several days.  In fact repeat MRI on 12/16 shows worsening stroke compared to scan on admission.  -Multiple discussions held with family per pulmonary medicine team as well as palliative medicine. They want to continue aggressive care with FULL CODE STATUS at this time.      Data Reviewed: Basic Metabolic Panel: Recent Labs  Lab 01/29/21 1009 01/31/21 0202 02/02/21 0232 02/03/21 0751  NA 138 135 140 138  K 4.1 4.1 4.3 4.0  CL 109 105 106 105  CO2 20* 21* 24 25  GLUCOSE 153* 139* 128* 139*  BUN 39* 40* 37* 44*  CREATININE 1.57* 1.52* 1.18 1.16  CALCIUM 9.1 8.8* 9.3 8.9  MG  --   --  2.2  --     CBC: Recent Labs  Lab 01/29/21 1009 01/31/21 0202 02/02/21 0232 02/03/21 0751  WBC 8.4 8.9 7.7 8.3  NEUTROABS 6.0 5.7 5.0 5.4  HGB 10.2* 9.9* 10.6* 10.6*  HCT 31.9* 31.2* 33.2* 32.8*  MCV 74.9* 75.0* 75.5* 75.2*  PLT 299 275 264 262     CBG: Recent Labs  Lab 02/04/21 1626 02/04/21 2112 02/05/21 0007 02/05/21 0410 02/05/21 0728  GLUCAP 150* 143* 158* 140* 153*    Recent Results (from the past 240 hour(s))  Culture, blood (routine x 2)     Status: None   Collection Time: 01/29/21 10:09 AM   Specimen: Left Antecubital; Blood  Result Value Ref Range Status   Specimen Description LEFT ANTECUBITAL  Final   Special Requests   Final    BOTTLES DRAWN AEROBIC AND ANAEROBIC Blood Culture adequate volume   Culture   Final    NO GROWTH 5 DAYS Performed at [facility], [place] [address] [place], [place] [address]    Report Status 02/03/2021 FINAL  Final  Culture, blood (routine x 2)     Status: None   Collection Time: 01/29/21 10:31 AM   Specimen: BLOOD LEFT HAND  Result Value Ref Range Status   Specimen Description BLOOD LEFT HAND   Final   Special Requests   Final    BOTTLES DRAWN AEROBIC AND ANAEROBIC Blood Culture adequate volume   Culture   Final    NO GROWTH 5 DAYS Performed at [facility], [place] [address], [place], [place] [address]    Report Status 02/03/2021 FINAL  Final  Urine Culture     Status: None   Collection Time: 01/29/21  3:56 PM   Specimen: Urine, Catheterized  Result Value Ref Range Status   Specimen Description URINE, CATHETERIZED  Final   Special Requests NONE  Final   Culture   Final    NO GROWTH Performed at [facility], [place] [address] [place], [place] [address]    Report Status 01/30/2021 FINAL  Final  Culture, Respiratory w Gram Stain     Status: None   Collection Time: 01/30/21 11:18 AM   Specimen: Tracheal Aspirate; Respiratory  Result Value Ref Range Status   Specimen Description TRACHEAL ASPIRATE  Final   Special Requests NONE  Final   Gram Stain   Final    ABUNDANT WBC PRESENT,BOTH PMN AND MONONUCLEAR MODERATE GRAM POSITIVE COCCI RARE GRAM NEGATIVE RODS RARE GRAM VARIABLE ROD    Culture   Final    ABUNDANT SERRATIA MARCESCENS FEW KLEBSIELLA PNEUMONIAE MODERATE STREPTOCOCCUS  GROUP F Beta hemolytic streptococci are predictably susceptible to penicillin and other beta lactams. Susceptibility testing not routinely performed. Performed at [facility], [place] [address] [place],  [address]    Report Status 02/02/2021 FINAL  Final   Organism ID, Bacteria SERRATIA MARCESCENS  Final   Organism ID, Bacteria KLEBSIELLA PNEUMONIAE  Final      Susceptibility   Klebsiella pneumoniae - MIC*    AMPICILLIN >=32 RESISTANT Resistant     CEFAZOLIN <=4 SENSITIVE Sensitive     CEFEPIME <=0.12 SENSITIVE Sensitive     CEFTAZIDIME <=1 SENSITIVE Sensitive     CEFTRIAXONE <=0.25 SENSITIVE Sensitive     CIPROFLOXACIN <=0.25 SENSITIVE Sensitive     GENTAMICIN <=1 SENSITIVE Sensitive     IMIPENEM <=0.25 SENSITIVE Sensitive     TRIMETH/SULFA <=20 SENSITIVE Sensitive      AMPICILLIN/SULBACTAM 8 SENSITIVE Sensitive     PIP/TAZO <=4 SENSITIVE Sensitive     * FEW KLEBSIELLA PNEUMONIAE   Serratia marcescens - MIC*    CEFAZOLIN >=64 RESISTANT Resistant     CEFEPIME <=0.12 SENSITIVE Sensitive     CEFTAZIDIME <=1 SENSITIVE Sensitive     CEFTRIAXONE <=0.25 SENSITIVE Sensitive     CIPROFLOXACIN <=0.25 SENSITIVE Sensitive     GENTAMICIN <=1 SENSITIVE Sensitive     TRIMETH/SULFA <=20 SENSITIVE Sensitive     * ABUNDANT SERRATIA MARCESCENS     Studies: No results found.  Scheduled Meds:  baclofen  5 mg Per Tube TID AC & HS   bethanechol  25 mg Per Tube TID   carvedilol  25 mg Per Tube BID WC   chlorhexidine gluconate (MEDLINE KIT)  15 mL Mouth Rinse BID   Chlorhexidine Gluconate Cloth  6 each Topical Q0600   cloNIDine  0.1 mg Per Tube Q8H   fiber  1 packet Per Tube BID   free water  100 mL Per Tube Q6H   heparin injection (subcutaneous)  5,000 Units Subcutaneous Q8H   hydrALAZINE  50 mg Per Tube Q8H   insulin aspart  0-20 Units Subcutaneous Q4H   insulin detemir  20 Units Subcutaneous BID   mouth rinse  15 mL Mouth Rinse 10 times per day   nutrition supplement (JUVEN)  1 packet Per Tube BID BM   OLANZapine  5 mg Per Tube QHS   oxyCODONE  2.5 mg Per Tube Q8H   pantoprazole sodium  40 mg Per Tube Daily   polyethylene glycol  17 g Per Tube Daily   scopolamine  1 patch Transdermal Q72H   Continuous Infusions:  ceFEPime (MAXIPIME) IV 2 g (02/05/21 0617)   feeding supplement (GLUCERNA 1.5 CAL) 60 mL/hr at 02/04/21 1714    Principal Problem:   ICH (intracerebral hemorrhage) ([place]) Active Problems:   Malignant hypertension   Diabetes mellitus type 2 in obese ([place])   Acute respiratory failure ([place])   Tracheostomy dependence ([place])   Pneumonia due to Serratia marcescens ([place])   MSSA (methicillin susceptible Staphylococcus aureus) pneumonia ([place])   Stage 3b chronic kidney disease (CKD) ([place])   Acute kidney injury ([place])   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 ([place])   Acute hypernatremia   Dysphagia due to recent stroke   Acute urinary retention   Respiration abnormal   Pressure injury of skin   Abdominal distention   Intractable hiccups   Pneumonia due to Klebsiella pneumoniae ([place])   Swollen lip   Fever   Muscular hypertonicity   Consultants: Neurology PCCM Medicine  Procedures: Echocardiogram EEG Cortrack PEG tube placement Tracheostomy tube placement  Antibiotics: Zosyn 12/15 through 12/21 Cefepime 12/24 through 12/27 Bactrim 12/27 >>   Time spent: 45 minutes    Erin Hearing ANP  [facility] 7 am - 330 pm/M-F for direct patient care and secure chat Please refer to Amion for contact info 37  days

## 2021-02-05 NOTE — Plan of Care (Signed)
°  Problem: Clinical Measurements: Goal: Will remain free from infection Outcome: Progressing Goal: Respiratory complications will improve Outcome: Progressing Goal: Cardiovascular complication will be avoided Outcome: Progressing   Problem: Activity: Goal: Risk for activity intolerance will decrease Outcome: Progressing   Problem: Coping: Goal: Level of anxiety will decrease Outcome: Progressing   Problem: Elimination: Goal: Will not experience complications related to urinary retention Outcome: Progressing   Problem: Safety: Goal: Ability to remain free from injury will improve Outcome: Progressing

## 2021-02-05 NOTE — NC FL2 (Signed)
[place] LEVEL OF CARE SCREENING TOOL     IDENTIFICATION  Patient Name: Stephen Delgado Birthdate: October 08, 1965 Sex: male Admission Date (Current Location): 12/30/2020  [facility] and [place] Number:  Herbalist and Address:  [facility], [place] [address], [place], [place] [address]      Provider Number: [id number]  Attending Physician Name and Address:  Bonnielee Haff, MD  Relative Name and Phone Number:  [name] Taber - spouse - [phone]    Current Level of Care: Hospital Recommended Level of Care: [place] Prior Approval Number:    Date Approved/Denied:   PASRR Number: pending  Discharge Plan: SNF    Current Diagnoses: Patient Active Problem List   Diagnosis Date Noted   Muscular hypertonicity    Fever    Pneumonia due to Klebsiella pneumoniae ([place])    Swollen lip    Intractable hiccups    Abdominal distention    Tracheostomy dependence ([place]) 01/23/2021   Pneumonia due to Serratia marcescens ([place]) 01/23/2021   MSSA (methicillin susceptible Staphylococcus aureus) pneumonia ([place]) 01/23/2021   Stage 3b chronic kidney disease (CKD) ([place]) 01/23/2021   Acute kidney injury ([place]) 01/23/2021   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 ([place]) 01/23/2021   Acute hypernatremia 01/23/2021   Dysphagia due to recent stroke 01/23/2021   Acute urinary retention 01/23/2021   Pressure injury of skin 01/23/2021   Respiration abnormal    Acute respiratory failure ([place])    ICH (intracerebral hemorrhage) ([place]) 12/30/2020   Diabetes mellitus type 2 in obese ([place]) 02/28/2016   Healthcare maintenance 02/08/2016   Sinus congestion 04/28/2015   Diabetes mellitus ([place]) 04/28/2015   Malignant hypertension 04/19/2015   Abnormal EKG 04/19/2015   Chronic kidney disease (CKD) 04/19/2015   Microcytosis 04/19/2015    Orientation RESPIRATION BLADDER Height & Weight      (unresponsive)   Tracheostomy (8 mm cuffed Shiley trach at present - 21% humified) Indwelling catheter Weight: 89 kg Height:  6' (182.9 cm)  BEHAVIORAL SYMPTOMS/MOOD NEUROLOGICAL BOWEL NUTRITION STATUS      Incontinent Feeding tube  AMBULATORY STATUS COMMUNICATION OF NEEDS Skin   Total Care Does not communicate PU Stage and Appropriate Care (stage 2 - medial anus - 2 red open areas)   PU Stage 2 Dressing: No Dressing                   Personal Care Assistance Level of Assistance  Bathing, Feeding, Dressing, Total care Bathing Assistance: Maximum assistance Feeding assistance: Maximum assistance (Tube feedings via gastrostomy tube) Dressing Assistance: Maximum assistance Total Care Assistance: Maximum assistance   Functional Limitations Info  Sight, Hearing, Speech Sight Info: Impaired (unresponsive) Hearing Info: Impaired (unresponsive) Speech Info: Impaired (unresponsive)    SPECIAL CARE FACTORS FREQUENCY  PT (By licensed PT), OT (By licensed OT)     PT Frequency: 2-3 times per week OT Frequency: 2-3 times per week            Contractures Contractures Info: Not present    Additional Factors Info  Code Status, Allergies, Psychotropic, Insulin Sliding Scale Code Status Info: Full code Allergies Info: NKDA Psychotropic Info: Zyprexa Insulin Sliding Scale Info: see discharge summary       Current Medications (02/05/2021):  This is the current hospital active medication list Current Facility-Administered Medications  Medication Dose Route Frequency Provider Last Rate Last Admin   acetaminophen (TYLENOL) tablet 650 mg  650 mg Oral Q4H PRN Lindzen,  Randall Hiss, MD   650 mg at 01/20/21 7829   Or   acetaminophen (TYLENOL) 160 MG/5ML solution 650 mg  650 mg Per Tube Q4H PRN Kerney Elbe, MD   650 mg at 02/03/21 5621   Or   acetaminophen (TYLENOL) suppository 650 mg  650 mg Rectal Q4H PRN Kerney Elbe, MD   650 mg at 01/13/21 0430   albuterol (PROVENTIL) (2.5 MG/3ML) 0.083% nebulizer solution  2.5 mg  2.5 mg Nebulization Q6H PRN Spero Geralds, MD       amantadine (SYMMETREL) 50 MG/5ML solution 100 mg  100 mg Per Tube BID Samella Parr, NP   100 mg at 02/05/21 1019   baclofen (LIORESAL) tablet 5 mg  5 mg Per Tube TID AC & HS Samella Parr, NP   5 mg at 02/05/21 1020   bethanechol (URECHOLINE) tablet 25 mg  25 mg Per Tube TID Cristal Generous, NP   25 mg at 02/05/21 0853   carvedilol (COREG) tablet 25 mg  25 mg Per Tube BID WC Agarwala, Einar Grad, MD   25 mg at 02/05/21 0853   ceFEPIme (MAXIPIME) 2 g in sodium chloride 0.9 % 100 mL IVPB  2 g Intravenous Q8H Hammons, Kimberly B, RPH 200 mL/hr at 02/05/21 0617 2 g at 02/05/21 0617   chlorhexidine gluconate (MEDLINE KIT) (PERIDEX) 0.12 % solution 15 mL  15 mL Mouth Rinse BID Olalere, Adewale A, MD   15 mL at 02/05/21 0855   Chlorhexidine Gluconate Cloth 2 % PADS 6 each  6 each Topical Q0600 Amie Portland, MD   6 each at 02/05/21 0855   cloNIDine (CATAPRES) tablet 0.1 mg  0.1 mg Per Tube Lina Sar, MD   0.1 mg at 02/05/21 0617   docusate (COLACE) 50 MG/5ML liquid 100 mg  100 mg Per Tube Daily PRN Chesley Mires, MD       feeding supplement (GLUCERNA 1.5 CAL) liquid 1,000 mL  1,000 mL Per Tube Continuous Alekh, Kshitiz, MD 60 mL/hr at 02/05/21 1020 1,000 mL at 02/05/21 1020   fiber (NUTRISOURCE FIBER) 1 packet  1 packet Per Tube BID Maryjane Hurter, MD   1 packet at 02/05/21 0853   free water 100 mL  100 mL Per Tube Q6H Samella Parr, NP   100 mL at 02/05/21 0618   heparin injection 5,000 Units  5,000 Units Subcutaneous Q8H Agarwala, Einar Grad, MD   5,000 Units at 02/05/21 0618   hydrALAZINE (APRESOLINE) injection 10-40 mg  10-40 mg Intravenous Q6H PRN Cristal Generous, NP   20 mg at 02/04/21 0454   hydrALAZINE (APRESOLINE) tablet 50 mg  50 mg Per Tube Q8H Samella Parr, NP   50 mg at 02/05/21 0616   influenza vac split quadrivalent PF (FLUARIX) injection 0.5 mL  0.5 mL Intramuscular Prior to discharge Dahal, Marlowe Aschoff, MD       insulin  aspart (novoLOG) injection 0-20 Units  0-20 Units Subcutaneous Q4H Bowser, Laurel Dimmer, NP   4 Units at 02/05/21 0801   insulin detemir (LEVEMIR) injection 20 Units  20 Units Subcutaneous BID Jacky Kindle, MD   20 Units at 02/05/21 0853   labetalol (NORMODYNE) injection 5-20 mg  5-20 mg Intravenous Q10 min PRN Rosalin Hawking, MD   20 mg at 01/25/21 0544   MEDLINE mouth rinse  15 mL Mouth Rinse 10 times per day Sherrilyn Rist A, MD   15 mL at 02/05/21 0909   morphine 2 MG/ML injection 1 mg  1 mg Intravenous Q4H PRN Terrilee Croak, MD   1 mg at 01/24/21 0235   nutrition supplement (JUVEN) (JUVEN) powder packet 1 packet  1 packet Per Tube BID BM Alekh, Kshitiz, MD   1 packet at 02/05/21 0853   OLANZapine (ZYPREXA) tablet 5 mg  5 mg Per Tube QHS Samella Parr, NP   5 mg at 02/04/21 2135   ondansetron (ZOFRAN) injection 4 mg  4 mg Intravenous Q6H PRN Chesley Mires, MD       oxyCODONE (ROXICODONE) 5 MG/5ML solution 2.5 mg  2.5 mg Per Tube Q8H Samella Parr, NP   2.5 mg at 02/05/21 0616   pantoprazole sodium (PROTONIX) 40 mg/20 mL oral suspension 40 mg  40 mg Per Tube Daily Rosalin Hawking, MD   40 mg at 02/05/21 0852   polyethylene glycol (MIRALAX / GLYCOLAX) packet 17 g  17 g Per Tube Daily Caren Griffins, MD   17 g at 02/05/21 0853   scopolamine (TRANSDERM-SCOP) 1 MG/3DAYS 1.5 mg  1 patch Transdermal Q72H Samella Parr, NP   1.5 mg at 02/04/21 2392     Discharge Medications: Please see discharge summary for a list of discharge medications.  Relevant Imaging Results:  Relevant Lab Results:   Additional Information SS# [id number]  Curlene Labrum, RN

## 2021-02-06 LAB — COMPREHENSIVE METABOLIC PANEL
ALT: 53 U/L — ABNORMAL HIGH (ref 0–44)
AST: 29 U/L (ref 15–41)
Albumin: 2.1 g/dL — ABNORMAL LOW (ref 3.5–5.0)
Alkaline Phosphatase: 45 U/L (ref 38–126)
Anion gap: 5 (ref 5–15)
BUN: 52 mg/dL — ABNORMAL HIGH (ref 6–20)
CO2: 26 mmol/L (ref 22–32)
Calcium: 8.8 mg/dL — ABNORMAL LOW (ref 8.9–10.3)
Chloride: 108 mmol/L (ref 98–111)
Creatinine, Ser: 1.05 mg/dL (ref 0.61–1.24)
GFR, Estimated: >60 mL/min (ref >60)
Glucose, Bld: 136 mg/dL — ABNORMAL HIGH (ref 70–99)
Potassium: 4 mmol/L (ref 3.5–5.1)
Sodium: 139 mmol/L (ref 135–145)
Total Bilirubin: 0.3 mg/dL (ref 0.3–1.2)
Total Protein: 6 g/dL — ABNORMAL LOW (ref 6.5–8.1)

## 2021-02-06 LAB — GLUCOSE, CAPILLARY
Glucose-Capillary: 128 mg/dL — ABNORMAL HIGH (ref 70–99)
Glucose-Capillary: 147 mg/dL — ABNORMAL HIGH (ref 70–99)
Glucose-Capillary: 130 mg/dL — ABNORMAL HIGH (ref 70–99)
Glucose-Capillary: 143 mg/dL — ABNORMAL HIGH (ref 70–99)
Glucose-Capillary: 137 mg/dL — ABNORMAL HIGH (ref 70–99)

## 2021-02-06 NOTE — Progress Notes (Signed)
Nutrition Follow-up  DOCUMENTATION CODES:   Not applicable  INTERVENTION:  Continue TF via PEG: Glucerna 1.5 @ 22m/hr (14430mday) Free water per MD, currently 10030mree water Q6H   Tube feeding regimen provides 2160 kcal, 118 grams of protein, and 1092 ml of H2O (1492m35mtal free water).  -1 packet Juven BID, each packet provides 95 calories, 2.5 grams of protein (collagen), and 9.8 grams of carbohydrate (3 grams sugar); also contains 7 grams of L-arginine and L-glutamine, 300 mg vitamin C, 15 mg vitamin E, 1.2 mcg vitamin B-12, 9.5 mg zinc, 200 mg calcium, and 1.5 g  Calcium Beta-hydroxy-Beta-methylbutyrate to support wound healing  NUTRITION DIAGNOSIS:   Inadequate oral intake related to lethargy/confusion, dysphagia as evidenced by NPO status.  ongoing  GOAL:   Patient will meet greater than or equal to 90% of their needs  Met with TF  MONITOR:   Diet advancement, Labs, Weight trends  REASON FOR ASSESSMENT:   Ventilator, Consult Enteral/tube feeding initiation and management  ASSESSMENT:   55 y14r old male who presented to the ED on 11/29 with AMS. PMH of HTN, T2DM, CKD stage III. 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  11/30 cortrak placed; tip gastric 12/2 trickle TF 12/3 pt intubated due to increased WOB, +emesis, TF held 12/4 trickle TF restarted 12/5 advancement orders placed 12/9 TF adjusted 12/13 s/p tracheostomy 12/16 s/p EGD and PEG placement 12/19 tx [facility] of ICU [facility]/23 developed Cheyne-Stokes respiratory pattern with respiratory alkalosis.Transferred to ICU for short-term mechanical ventilation and was found to have new tracheobronchitis now on cefepime 12/[facility] 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 Continues to require TF via PEG and tolerating w/o issue.  Continue to awaiting placement, possible upcoming involvement of PMT.    Current TF: Glucerna 1.5 @ 60ml26mw/ 100ml 20m water Q6H  UOP: 2300ml x44mours I/O: +6457ml si103madmit  Current weight: 80.1 kg Admit weight: 85.2 kg   Edema: mild pitting edema to BUE per RN assessment  Medications: Scheduled Meds:  amantadine  100 mg Per Tube BID   baclofen  5 mg Per Tube TID AC & HS   bethanechol  25 mg Per Tube TID   carvedilol  25 mg Per Tube BID WC   chlorhexidine gluconate (MEDLINE KIT)  15 mL Mouth Rinse BID   Chlorhexidine Gluconate Cloth  6 each Topical Q0600   cloNIDine  0.1 mg Per Tube Q8H   fiber  1 packet Per Tube BID   free water  100 mL Per Tube Q6H   heparin injection (subcutaneous)  5,000 Units Subcutaneous Q8H   hydrALAZINE  50 mg Per Tube Q8H   insulin aspart  0-20 Units Subcutaneous Q4H   insulin detemir  20 Units Subcutaneous BID   mouth rinse  15 mL Mouth Rinse 10 times per day   nutrition supplement (JUVEN)  1 packet Per Tube BID BM   OLANZapine  5 mg Per Tube QHS   oxyCODONE  2.5 mg Per Tube Q8H   pantoprazole sodium  40 mg Per Tube Daily   polyethylene glycol  17 g Per Tube Daily   scopolamine  1 patch Transdermal Q72H  Continuous Infusions:  feeding supplement (GLUCERNA 1.5 CAL) 1,000 mL (02/06/21 0527)   Labs: Recent Labs  Lab 02/02/21 0232 02/03/21 0751 02/06/21 0056  NA 140 138 139  K 4.3 4.0 4.0  CL 106 105  108  CO2 _0 BUN 37* 44* 52*  CREATININE 1.18 1.16 1.05  CALCIUM 9.3 8.9 8.8*  MG 2.2  --   --   GLUCOSE 128* 139* 136*  CBGs: 128-150 x24 hours  Diet Order:   Diet Order             Diet NPO time specified  Diet effective midnight                   EDUCATION NEEDS:   Not appropriate for education at this time  Skin:  Skin Assessment: Skin Integrity Issues: Skin Integrity Issues:: Stage II Stage II: anus  Last BM:  1/05 616m via rectal tube  Height:   Ht Readings from Last 1 Encounters:  01/23/21 6' (1.829 m)    Weight:   Wt Readings from Last 1 Encounters:  02/06/21  80.1 kg   BMI:  Body mass index is 23.95 kg/m.  Estimated Nutritional Needs:   Kcal:  2150-2350  Protein:  105-120 grams  Fluid:  >2L     Stephen Stanley, MS, RD, LDN (she/her/hers) RD pager number and weekend/on-call pager number located in [place]

## 2021-02-06 NOTE — Plan of Care (Signed)
  Problem: Clinical Measurements: Goal: Respiratory complications will improve Outcome: Progressing Goal: Cardiovascular complication will be avoided Outcome: Progressing   Problem: Activity: Goal: Risk for activity intolerance will decrease Outcome: Progressing   Problem: Nutrition: Goal: Adequate nutrition will be maintained Outcome: Progressing   

## 2021-02-06 NOTE — Progress Notes (Addendum)
NAME:  SILVER WISNER, MRN:  [id number], DOB:  1965/09/29, LOS: 38 ADMISSION DATE:  12/30/2020, CONSULTATION DATE:  12/19 REFERRING MD:  Roda Shutters, CHIEF COMPLAINT:  Dyspnea   History of Present Illness:  56 yo male presented with headache, nausea and altered mental status.  CT head showed acute Lt ICH with IVH likely related to hypertensive emergency (BP 195/116).  Had persistent hypertension and concern for airway protection, and PCCM asked to assist with ICU management.  Tracheostomy 12/13.  Moved out of ICU.   Pertinent  Medical History  HTN, DM type 2, CKD 3a, OSA  Significant Hospital Events: Including procedures, antibiotic start and stop dates in addition to other pertinent events   11/29 Admit, start cleviprex 12/01 PCCM consulted; changed from cleviprex to cardene due to elevated triglycerides 12/03 intubated; episode of vomiting >> tube feeds held 12/04 resume trickle tube feeds 12/06 remains minimally responsive. Tolerating SBT.  12/12 no acute events overnight, T-max 102 point 12/13 bedside trach planned midmorning  12/14 ATC. Lasix --> 5L UOP  12/15 cont on trach collar. Na to 154 from 145. Adding low rate d5  12/16 scheduled for PEG. Cont D5. Continues on trach collar Out of ICU 12/19 12/23 Cheyne-Stokes respirations noted and was transferred to ICU for short-term ventilation.  12/25 Returned to floor  Imaging/Studies: CT head 11/29 >> large amount of IVH involving lateral/3rd/4th ventricles, ICH in Lt BG, chronic microvascular ischemic changes Echo 11/30 >> EF 50 to 55%, mod LVH, grade 1 DD, ascending aorta 37 mm EEG 12/01 >> continuous generalized slowing MRI brain 12/03 >> numerous small acute infarcts in b/l cerebral hemispheres, Lt caudate hemorrhage, IVH, scattered sulcal SAH, mild communicating hydrocephalus CT head 12/23 Expected evolution of strokes, no shift or herniation   Interim History / Subjective:   Unchaged on 5L 28%  Objective   Blood pressure (!)  141/75, pulse 89, temperature 99.2 F (37.3 C), temperature source Oral, resp. rate 13, height 6' (1.829 m), weight 80.1 kg, SpO2 97 %.    FiO2 (%):  [21 %] 21 %   Intake/Output Summary (Last 24 hours) at 02/06/2021 1336 Last data filed at 02/06/2021 0503 Gross per 24 hour  Intake 660 ml  Output 2900 ml  Net -2240 ml    Filed Weights   01/30/21 0500 02/05/21 0421 02/06/21 0500  Weight: 88.9 kg 89 kg 80.1 kg     General:  unresponsive, chronically ill-appearing M trach dependent  HEENT: MM pink/dry, swollen lower lip, sz 8 cuffed trach in place Neuro: unresponsive off sedation CV: s1s2 rrr, no m/r/g PULM:  course breath sounds bilaterally, on 5L 28% trach collar  GI: soft, bsx4 active, mildly distended Extremities: warm/dry, no edema  Skin: no rashes or lesions    Resolved Hospital Problem list     Assessment & Plan:   # Acute basal left ganglia intraparenchymal hemorrhage with intraventricular hemorrhage in the setting of hypertensive emergency # 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 Pearlean Brownie previously, spouse -awaiting  SNF  Code Status:  full code Last date of multidisciplinary goals of care discussion [12/21, palliative care ]  Critical care time: N/A    Otilio Carpen Phelan Schadt, PA-C [facility] See Amion for pager If no response to pager , please call [phone] until 7pm After 7:00  pm call Elink  [phone]?[place]

## 2021-02-06 NOTE — Progress Notes (Signed)
[facility] PROGRESS NOTE  Manly Nestle Fish CNO:[id number] DOB: February 11, 1965 DOA: 12/30/2020 PCP: Patient, [name]   01/29/2021    01/29/2021                 02/04/2020  Status: Remains inpatient appropriate because:  Unsafe discharge plan.  Patient in persistent vegetative state per stroke needs placement at skilled nursing facility that is trach capable  Barriers to discharge: Social: Current insurance coverage about to expire and he otherwise has no funding for long-term care.  Clarifying regarding application for disability and Medicaid  Clinical: Tracheostomy and PEG dependent with recurrent tracheobronchitis; remains a full code  Level of care:  Med-Surg but transferring back to ICU for mechanical ventilation and stat CT of head due to neurological change as of 12/23   Code Status: Full code Family Communication: Wife at bedside 12/23; son on 1/5.  Updated on clinical changes including introduction of medications to treat brain injury sequela and hypertonicity.  Introduced Teacher, adult education of DNR to son and stated this should be considered by family but a decision was not required today since his father was otherwise medically stable.  Son aware that myself and palliative medicine team would like to meet with son and hopefully mother next week DVT prophylaxis: SCDs COVID vaccination status: Unknown  Foley catheter: 35 French inserted on 12/17; changed on 12/24 POA: No  HPI: 56 y.o. male with PMH significant for DM2, HTN, CKD with questionable compliance to medications Patient presented to the ED on 12/30/2020 with complaint of headache, altered mental status   In the ED, he was hypertensive to 238/160, agitated, required restraints CT head showed large amount of intraventricular hemorrhage involving lateral/third/fourth ventricle, ICH and left basal ganglia as well as chronic microvascular ischemic changes. He was started on Cleviprex drip and admitted to neuro  ICU   He subsequently had CT head repeated on 11/30, 12/1 and 12/3, all demonstrated unchanged intraparenchymal hemorrhage and intraventricular hemorrhage 12/3, MRI brain from also showed numerous small acute infarcts in both cerebral hemisphere with minimal improvement in the right superior cerebellum 12/3, intubated 12/13, tracheostomy 12/15, wean from vent to trach collar 12/16, PEG tube placement 12/16, repeat MRI showed an increase in the size and number of acute infarcts in the bilateral hemispheres with increased involvement in the cerebellum bilaterally.   12/18, [facility] neuro service [facility]  12/21, palliative care consulted.  Family wanted to continue aggressive care as full CODE STATUS. 12/23 developed Cheyne-Stokes respiratory pattern with respiratory alkalosis.  Transferred to ICU for short-term mechanical ventilation and was found to have new tracheobronchitis now on cefepime 12/25 back to progressive floor and to [facility]   His hospital course has been complicated by significant neurological impairment, aspiration events, AKI, hypernatremia, urinary retention.  Subjective: Unresponsive and having Cheyne-Stokes respiration pattern with episodes of apnea lasting 10 seconds  Objective: Vitals:   02/06/21 0344 02/06/21 0727  BP: (!) 147/82   Pulse: 85 89  Resp: 14   Temp:  99.1 F (37.3 C)  SpO2: 97%     Intake/Output Summary (Last 24 hours) at 02/06/2021 0745 Last data filed at 02/06/2021 0503 Gross per 24 hour  Intake 660 ml  Output 2900 ml  Net -2240 ml   Filed Weights   01/30/21 0500 02/05/21 0421 02/06/21 0500  Weight: 88.9 kg 89 kg 80.1 kg    Exam:  Constitutional: Remains unresponsive, in no acute distress Respiratory: #8.0 Shiley cuffed trach, no further apnea.  Lung  sounds more coarse but clear without rhonchi today, trach dressing with some thick grayish-tan secretions noted on dressing; having Cheyne-Stokes respirations again today with episodes of  apnea lasting 10 seconds Cardiovascular: S1-S2, NSR, normotensive, no bilateral lower extremity edema -no further tachycardia Abdomen: no tenderness, no masses palpated.  Abdomen soft.  PEG tube -normoactive bowels rectal Foley in place with liquid brown drainage Genitourinary: Foley catheter in place draining clear yellow urine to bedside bag Neurologic: Unable to accurately test cranial nerves.  Eyes closed.  No blink reflex when tested.  Appears to have a more normal respiratory pattern today.  Upper extremities are flaccid to PROM.  Bilateral lower extremities less hypertonic with improved flexion with PROM bilaterally. Psychiatric: Unresponsive   Assessment/Plan: Acute problems: Acute intraparenchymal and intraventricular hemorrhage Bilateral multifocal ischemic infarcts -Imaging and admission reviewed ischemic and hemorrhagic stroke likely from combination of uncontrolled hypertension and small vessel disease.  -no improvement in multiple repeat imagings.  Follow-up MRI on 12/16 demonstrated increase in size and number of acute infarcts  -Neurology has signed off as of 12/22 as they have nothing further to offer.  Currently on no blood thinners because of intracranial hemorrhage. -Baseline has episodes of [place] and Cheyne-Stokes respiratory patterns alternating with normal respiratory pattern - continue baclofen for hypertonicity and low-dose scheduled Oxy IR for suspected underlying pain  -1/5 added Symmetrel -Continue low-dose Zyprexa at HS to treat sequela of significant brain injury  Swollen lip -Resolved -concern for unwitnessed seizure -EEG consistent with moderate diffuse encephalopathy without evidence of seizures or epileptiform discharges.  CK 176 -Neurology consulted and no indications for LTM or AED  Acute respiratory failure with hypoxia 2/2 recurrent Serratia tracheobronchitis (recent MSSA and serratia pneumonia; recent Klebsiella tracheobronchitis); -Completed Zosyn  on 12/21 -12/23  acute tracheobronchitis.  Required short-term mechanical ventilation -12/28 After consultation with pharmacist de-escalated to Bactrim but given recurrent fevers and increased and pulmonary secretions pulmonary medicine recommended changing to Cefepime and obtaining sputum culture -12/30 Rpt resp cx c/w abundant Serratia and minimal amount of Klebsiella-sensitivities demonstrate both organisms sensitive to cefepime which we will continue for total of 7 days  Bilateral lower extremity edema -Diuresed 7850 cc UOP after one-time dose of 40 mg IV Lasix -BUN 44 with creatinine 1.16 which is stable  Essential hypertension/EKG with LVH strain -Blood pressure on admission was elevated to over 161 systolic. -SBP  128-163 as of 1/4 -Continue max dose Coreg and clonidine; 1/4 increased hydralazine with significant improvement in BP reading -Has IV Apresoline and Normodyne available prn -Suspect hypertonicity and associated pain contributing-see above regarding treatment  Mild abdominal distention/?  Constipation -Recent imaging unremarkable -Continue scheduled MiraLAX   AKI on CKD 3b -Baseline renal function: January 2022 BUN was 18 and creatinine 1.49 -Current creatinine 1.18 with a BUN of 37   Hypernatremia Resolved -Na+ peak 154 with current reading 140  Non obstructive transaminitis -Recurrent issue throughout hospitalization -CT abdomen and pelvis 12/23 unremarkable no evidence of cirrhosis or other biliary tree issues -Follow closely on Tylenol -Hep C antibody within normal limit -12/29 hepatitis panel unremarkable   Type 2 diabetes mellitus -A1c 6.3 in 12/31/2020 -Continue Levemir 20 units BID along with SSI every 4 hours -CBGs well controlled   Protein calorie malnutrition/dysphagia Nutrition Problem: Inadequate oral intake Etiology: lethargy/confusion, dysphagia Signs/Symptoms: NPO status Interventions: Tube feeding via PEG Body mass index is 23.95 kg/m.     Acute urinary retention  -Continue Urecholine -Currently has Foley catheter.  Last exchanged on 12/23.  As  of 1/5 plan is to clamp catheter for 4 hours alternating with unclamping   Goals of care -Poor prognosis.  No neurological recovery in last several days.  In fact repeat MRI on 12/16 shows worsening stroke compared to scan on admission.  -Multiple discussions held with family per pulmonary medicine team as well as palliative medicine. They want to continue aggressive care with FULL CODE STATUS at this time.      Data Reviewed: Basic Metabolic Panel: Recent Labs  Lab 01/31/21 0202 02/02/21 0232 02/03/21 0751 02/06/21 0056  NA 135 140 138 139  K 4.1 4.3 4.0 4.0  CL 105 106 105 108  CO2 21* '24 25 26  ' GLUCOSE 139* 128* 139* 136*  BUN 40* 37* 44* 52*  CREATININE 1.52* 1.18 1.16 1.05  CALCIUM 8.8* 9.3 8.9 8.8*  MG  --  2.2  --   --     CBC: Recent Labs  Lab 01/31/21 0202 02/02/21 0232 02/03/21 0751  WBC 8.9 7.7 8.3  NEUTROABS 5.7 5.0 5.4  HGB 9.9* 10.6* 10.6*  HCT 31.2* 33.2* 32.8*  MCV 75.0* 75.5* 75.2*  PLT 275 264 262     CBG: Recent Labs  Lab 02/05/21 1623 02/05/21 2050 02/05/21 2333 02/06/21 0445 02/06/21 0728  GLUCAP 150* 145* 141* 128* 147*    Recent Results (from the past 240 hour(s))  Culture, blood (routine x 2)     Status: None   Collection Time: 01/29/21 10:09 AM   Specimen: Left Antecubital; Blood  Result Value Ref Range Status   Specimen Description LEFT ANTECUBITAL  Final   Special Requests   Final    BOTTLES DRAWN AEROBIC AND ANAEROBIC Blood Culture adequate volume   Culture   Final    NO GROWTH 5 DAYS Performed at [facility], [place] [address] [place], [place] [address]    Report Status 02/03/2021 FINAL  Final  Culture, blood (routine x 2)     Status: None   Collection Time: 01/29/21 10:31 AM   Specimen: BLOOD LEFT HAND  Result Value Ref Range Status   Specimen Description BLOOD LEFT HAND  Final   Special Requests    Final    BOTTLES DRAWN AEROBIC AND ANAEROBIC Blood Culture adequate volume   Culture   Final    NO GROWTH 5 DAYS Performed at [facility], [place] [address] [place], [place] [address]    Report Status 02/03/2021 FINAL  Final  Urine Culture     Status: None   Collection Time: 01/29/21  3:56 PM   Specimen: Urine, Catheterized  Result Value Ref Range Status   Specimen Description URINE, CATHETERIZED  Final   Special Requests NONE  Final   Culture   Final    NO GROWTH Performed at [facility], [place] [address], [place], [place] [address]    Report Status 01/30/2021 FINAL  Final  Culture, Respiratory w Gram Stain     Status: None   Collection Time: 01/30/21 11:18 AM   Specimen: Tracheal Aspirate; Respiratory  Result Value Ref Range Status   Specimen Description TRACHEAL ASPIRATE  Final   Special Requests NONE  Final   Gram Stain   Final    ABUNDANT WBC PRESENT,BOTH PMN AND MONONUCLEAR MODERATE GRAM POSITIVE COCCI RARE GRAM NEGATIVE RODS RARE GRAM VARIABLE ROD    Culture   Final    ABUNDANT SERRATIA MARCESCENS FEW KLEBSIELLA PNEUMONIAE MODERATE STREPTOCOCCUS GROUP F Beta hemolytic streptococci are predictably susceptible to penicillin and  other beta lactams. Susceptibility testing not routinely performed. Performed at [facility], [place] [address] [place], [place] [address]    Report Status 02/02/2021 FINAL  Final   Organism ID, Bacteria SERRATIA MARCESCENS  Final   Organism ID, Bacteria KLEBSIELLA PNEUMONIAE  Final      Susceptibility   Klebsiella pneumoniae - MIC*    AMPICILLIN >=32 RESISTANT Resistant     CEFAZOLIN <=4 SENSITIVE Sensitive     CEFEPIME <=0.12 SENSITIVE Sensitive     CEFTAZIDIME <=1 SENSITIVE Sensitive     CEFTRIAXONE <=0.25 SENSITIVE Sensitive     CIPROFLOXACIN <=0.25 SENSITIVE Sensitive     GENTAMICIN <=1 SENSITIVE Sensitive     IMIPENEM <=0.25 SENSITIVE Sensitive     TRIMETH/SULFA <=20 SENSITIVE Sensitive     AMPICILLIN/SULBACTAM 8  SENSITIVE Sensitive     PIP/TAZO <=4 SENSITIVE Sensitive     * FEW KLEBSIELLA PNEUMONIAE   Serratia marcescens - MIC*    CEFAZOLIN >=64 RESISTANT Resistant     CEFEPIME <=0.12 SENSITIVE Sensitive     CEFTAZIDIME <=1 SENSITIVE Sensitive     CEFTRIAXONE <=0.25 SENSITIVE Sensitive     CIPROFLOXACIN <=0.25 SENSITIVE Sensitive     GENTAMICIN <=1 SENSITIVE Sensitive     TRIMETH/SULFA <=20 SENSITIVE Sensitive     * ABUNDANT SERRATIA MARCESCENS     Studies: No results found.  Scheduled Meds:  amantadine  100 mg Per Tube BID   baclofen  5 mg Per Tube TID AC & HS   bethanechol  25 mg Per Tube TID   carvedilol  25 mg Per Tube BID WC   chlorhexidine gluconate (MEDLINE KIT)  15 mL Mouth Rinse BID   Chlorhexidine Gluconate Cloth  6 each Topical Q0600   cloNIDine  0.1 mg Per Tube Q8H   fiber  1 packet Per Tube BID   free water  100 mL Per Tube Q6H   heparin injection (subcutaneous)  5,000 Units Subcutaneous Q8H   hydrALAZINE  50 mg Per Tube Q8H   insulin aspart  0-20 Units Subcutaneous Q4H   insulin detemir  20 Units Subcutaneous BID   mouth rinse  15 mL Mouth Rinse 10 times per day   nutrition supplement (JUVEN)  1 packet Per Tube BID BM   OLANZapine  5 mg Per Tube QHS   oxyCODONE  2.5 mg Per Tube Q8H   pantoprazole sodium  40 mg Per Tube Daily   polyethylene glycol  17 g Per Tube Daily   scopolamine  1 patch Transdermal Q72H   Continuous Infusions:  feeding supplement (GLUCERNA 1.5 CAL) 1,000 mL (02/06/21 0527)    Principal Problem:   ICH (intracerebral hemorrhage) ([place]) Active Problems:   Malignant hypertension   Diabetes mellitus type 2 in obese ([place])   Acute respiratory failure ([place])   Tracheostomy dependence ([place])   Pneumonia due to Serratia marcescens ([place])   MSSA (methicillin susceptible Staphylococcus aureus) pneumonia ([place])   Stage 3b chronic kidney disease (CKD) ([place])   Acute kidney injury ([place])   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  ([place])   Acute hypernatremia   Dysphagia due to recent stroke   Acute urinary retention   Respiration abnormal   Pressure injury of skin   Abdominal distention   Intractable hiccups   Pneumonia due to Klebsiella pneumoniae ([place])   Swollen lip   Fever   Muscular hypertonicity   Consultants: Neurology PCCM Medicine  Procedures: Echocardiogram EEG Cortrack PEG tube placement Tracheostomy tube placement  Antibiotics: Zosyn 12/15 through 12/21 Cefepime 12/24 through 12/27 Bactrim 12/27 >>   Time spent: 45 minutes    Erin Hearing ANP  [facility] 7 am - 330 pm/M-F for direct patient care and secure chat Please refer to Amion for contact info 38  days

## 2021-02-06 NOTE — Progress Notes (Signed)
Palliative Medicine Inpatient Follow Up Note  Consulting Provider: August Albino, NP   Reason for consult:   [place]  Reason for Consult? GOC/EOL decision making with family    HPI:  Per intake H&P --> 56 yo male presented with headache, nausea and altered mental status.  CT head showed acute Lt ICH with IVH likely related to hypertensive emergency (BP 195/116).  Had persistent hypertension and concern for airway protection, intubated.    Neurology shared with patients son and spouse that his neurological exam remains to be poor and his likelihood of recovery is dismal. Palliative care was asked to follow up with additional goals of care conversations.   Today's Discussion (02/06/2021):   *Please note that this is a verbal dictation therefore any spelling or grammatical errors are due to the "[place]" system interpretation.   Chart reviewed.  Progress notes, imaging studies, laboratory results have been evaluated.  I spoke to [facility] nurse, [place] who shares with me that Naazir is in a which she describes as " vegetative state" and that he is not doing or saying anything.  She shares she has no nursing concerns at this time.  I assessed [place] at bedside he was not able to follow any commands and remains on a tracheostomy collar.  He did not appear to have any nonverbal indicators of distress.  I called patient's spouse, [name].  I asked [name] if we may arrange for a family meeting in the setting of Koron's ongoing decline state. [name] shares that her son, [name] is more keen to what is going on with Sonia Side.  She goes on to share with me that Christina's brother and mother are still alive. They are in [place] presently. [name] expresses the difficulty associated with making decisions for Vincente is sharing his clinical state with them and everyone within the family hoping for a "miracle".   [name] is aware of the devastating  injury that Abhiraj has endured.  She is aware that he will never be able to work again or lead a life of productivity.  She is aware that he will require care in the bed for the rest of his life.  She shares the conflict she has with his present state in the hope that she continues to have for a miracle.    We reviewed what the word miracle went in the context of Toribio's illness.  I shared that sometimes a miracle is allowing patients to pass in the most natural state as opposed to aggressive modalities of care to prolong life and potential suffering. [name] heard this and was thankful for the definition though she is apprehensive to make any decisions moving forward.  I reviewed that at the very least it would be worthwhile to consider Braelon's CODE STATUS.  We reviewed that he has been through a tremendous amount during his 37-day hospitalization and if anything considering a DO NOT RESUSCITATE would likely be within his best interest.  We reviewed that he would be subject to a tremendous amount of force if he suffered a cardiac arrest with little to no benefit.  [name] would like to talk about this with her son(s).  We reviewed that we will meet at some point for further conversation though she could not commit to a time at this moment   Objective Assessment: Vital Signs     Vitals:    01/11/21 1300 01/11/21 1511  BP: (!) 115/59    Pulse:  81 76  Resp: (!) 21 17  Temp:      SpO2: 94% 96%      Intake/Output Summary (Last 24 hours) at 01/11/2021 1630 Last data filed at 01/11/2021 1300    Gross per 24 hour  Intake 2225.08 ml  Output 1450 ml  Net 775.08 ml    Last Weight  Most recent update: 01/07/2021  5:27 AM      Weight  85.2 kg (187 lb 13.3 oz)                   Gen:  Exceptionally ill Middle aged AA M HEENT: Tracheostomy, dry mucous membranes CV: Regular rate and rhythm  PULM: On Trach collar ABD: (+) G-Tube EXT: Generalized edema  Neuro: Sedated   Summary and  Recommendations: Full Code/ Full Scope of care-> Discussed with patients spouse the concept of DNR   S/P Trach/PEG  Patients spouse, [name] is trying to arrange a meeting time with her family for further conversations  Ongoing goals of care discussions in the setting of severe debility   Incremental Palliative Support   MDM - High in the setting of decision regarding future escalation or de-escalation of care  ______________________________________________________________________________________ [place] Team Team Cell Phone: [phone] Please utilize secure chat with additional questions, if there is no response within 30 minutes please call the above phone number   Palliative Medicine Team providers are available by phone from 7am to 7pm daily and can be reached through the team cell phone.  Should this patient require assistance outside of these hours, please call the patient's attending physician.

## 2021-02-07 LAB — GLUCOSE, CAPILLARY
Glucose-Capillary: 128 mg/dL — ABNORMAL HIGH (ref 70–99)
Glucose-Capillary: 133 mg/dL — ABNORMAL HIGH (ref 70–99)
Glucose-Capillary: 143 mg/dL — ABNORMAL HIGH (ref 70–99)
Glucose-Capillary: 144 mg/dL — ABNORMAL HIGH (ref 70–99)
Glucose-Capillary: 144 mg/dL — ABNORMAL HIGH (ref 70–99)
Glucose-Capillary: 147 mg/dL — ABNORMAL HIGH (ref 70–99)

## 2021-02-07 NOTE — Progress Notes (Signed)
° °  Palliative Medicine Inpatient Follow Up Note  Patients son, [name] called the Palliative Medicine Team this morning.   [name] expressed a desire to meet tomorrow at 4PM. He will extend the invitation to his entire family.  ____________________  Plan for PMT to meet with patients family tomorrow at 4PM.  No Charge ______________________________________________________________________________________ Lamarr Lulas [facility] Palliative Medicine Team Team Cell Phone: [phone] Please utilize secure chat with additional questions, if there is no response within 30 minutes please call the above phone number   Palliative Medicine Team providers are available by phone from 7am to 7pm daily and can be reached through the team cell phone.  Should this patient require assistance outside of these hours, please call the patient's attending physician.

## 2021-02-07 NOTE — Progress Notes (Signed)
Please review progress note by Junious Silk, NP for details regarding patient's hospital stay and active issues.  Patient remains unresponsive is has been the case during this hospitalization.  His sister-in-law is at the bedside.  Noted to be afebrile.  Heart rate is normal.  Blood pressure is reasonably well controlled.  Saturations in the 90s on trach collar at 5 L/min.  Opens his eyes but then goes right back to sleep Diminished air entry at the bases.  No wheezing or rhonchi appreciated.  Few crackles. Tracheostomy noted. S1-S2 is normal regular Abdomen is soft.  PEG tube is noted. Foley catheter is noted.  Patient was being treated for tracheobronchitis as he was febrile few days ago.  He initially completed course of Zosyn in December.  Then was placed on cefepime on 12/31 and he has completed a 7-day course of that as well.  Noted to be afebrile.  WBC was normal a few days ago.  Patient's respiratory status is stable.  He is currently on a trach collar.  Pulmonology is following intermittently.  Tube was downsized to #6 cuffless on 1/6.  Continue antihypertensives.  Blood pressures are noted to be stable.  Monitor CBGs.  Continue Levemir.  Will continue to monitor.  [name] 02/07/2021

## 2021-02-07 NOTE — Plan of Care (Signed)
  Problem: Clinical Measurements: Goal: Respiratory complications will improve Outcome: Progressing Goal: Cardiovascular complication will be avoided Outcome: Progressing   Problem: Activity: Goal: Risk for activity intolerance will decrease Outcome: Progressing   Problem: Safety: Goal: Ability to remain free from injury will improve Outcome: Progressing   Problem: Skin Integrity: Goal: Risk for impaired skin integrity will decrease Outcome: Progressing   

## 2021-02-08 LAB — GLUCOSE, CAPILLARY
Glucose-Capillary: 126 mg/dL — ABNORMAL HIGH (ref 70–99)
Glucose-Capillary: 127 mg/dL — ABNORMAL HIGH (ref 70–99)
Glucose-Capillary: 127 mg/dL — ABNORMAL HIGH (ref 70–99)
Glucose-Capillary: 132 mg/dL — ABNORMAL HIGH (ref 70–99)
Glucose-Capillary: 148 mg/dL — ABNORMAL HIGH (ref 70–99)
Glucose-Capillary: 158 mg/dL — ABNORMAL HIGH (ref 70–99)

## 2021-02-08 NOTE — Progress Notes (Signed)
Palliative Medicine Inpatient Follow Up Note  Consulting Provider: August Albino, NP   Reason for consult:   [place]  Reason for Consult? GOC/EOL decision making with family    HPI:  Per intake H&P --> 56 yo male presented with headache, nausea and altered mental status.  CT head showed acute Lt ICH with IVH likely related to hypertensive emergency (BP 195/116).  Had persistent hypertension and concern for airway protection, intubated.    Neurology shared with patients son and spouse that his neurological exam remains to be poor and his likelihood of recovery is dismal. Palliative care was asked to follow up with additional goals of care conversations.   Today's Discussion (02/08/2021):   *Please note that this is a verbal dictation therefore any spelling or grammatical errors are due to the "[place] One" system interpretation.   Chart reviewed.  Progress notes, imaging studies, laboratory results have been evaluated.  I met with Stephen Delgado's two sons, wife, brother, and nephew this afternoon.   Reviewed his present clinical state.  Per family they have seen improvements in the past three weeks. They feel he can open his eyes when his name is called. They review that he is no longer supported by the ventilator. These are steps in the right direction per family.   They would wish in an ideal world for Stephen Delgado to be able to open his eyes, recognize people, and track. They share that these would be their goals for him as they realize he will never be able to get up, walk, or care for himself again. They realize he will need long term care for the rest of his life and do believe this is an acceptable quality of life for him.  A comprehensive review of code status was held inclusive of CPR, antiarrythmic's, pressors, and intubation with mechanical ventilation.   I reviewed that in a person with > 2 chronic conditions outcomes are  exceptionally poor. Discussed that in Stephen Delgado's case he would be very unlikely to survive such an event and we would cause his body tremendous harm and trauma in the setting of these life saving interventions with a worse quality of life than he has presently if he were to survive. They all acknowledge this. Despite this knowledge his family is very assertive in their desire to continue all interventions in the setting of his continued clinical stability.   Reviewed patients families strong faith in 21 and miracles. Reviewed that to them him still being on earth is enough.   Discussed if Stephen Delgado should decline further or suffer recurrent infectious events that they would be willing to have additional conversations but for the time being they would like to do what is being done.  Patients family endorse the need for assistance completing medicaid application for transition to skilled nursing. I shared that I will request social work provides them with information to the financial office.   Objective Assessment: Vital Signs     Vitals:    01/11/21 1300 01/11/21 1511  BP: (!) 115/59    Pulse: 81 76  Resp: (!) 21 17  Temp:      SpO2: 94% 96%      Intake/Output Summary (Last 24 hours) at 01/11/2021 1630 Last data filed at 01/11/2021 1300    Gross per 24 hour  Intake 2225.08 ml  Output 1450 ml  Net 775.08 ml    Last Weight  Most recent update: 01/07/2021  5:27 AM  Weight  85.2 kg (187 lb 13.3 oz)                   Gen:  Exceptionally ill Middle aged AA M HEENT: Tracheostomy, dry mucous membranes CV: Regular rate and rhythm  PULM: On Trach collar ABD: (+) G-Tube EXT: Generalized edema  Neuro: Sedated   Summary and Recommendations: Full Code/ Full Scope of care-> Discussed with patients family the concept of DNR in great detail they choose to continue to pursue any and all measures for life preservation   S/P Trach/PEG  Goal for long term placement in skilled care -->  Appreciate TOC helping guide the family on medicaid application  Incremental Palliative Support - Goals are clear   MDM - High in the setting of decision regarding future escalation or de-escalation of care   Total Time: 72 ______________________________________________________________________________________ [place] Team Team Cell Phone: [phone] Please utilize secure chat with additional questions, if there is no response within 30 minutes please call the above phone number   Palliative Medicine Team providers are available by phone from 7am to 7pm daily and can be reached through the team cell phone.  Should this patient require assistance outside of these hours, please call the patient's attending physician.

## 2021-02-08 NOTE — Plan of Care (Signed)
  Problem: Clinical Measurements: Goal: Respiratory complications will improve Outcome: Progressing Goal: Cardiovascular complication will be avoided Outcome: Progressing   Problem: Nutrition: Goal: Adequate nutrition will be maintained Outcome: Progressing   Problem: Elimination: Goal: Will not experience complications related to bowel motility Outcome: Progressing   

## 2021-02-08 NOTE — Progress Notes (Signed)
Please review progress notes by Junious Silk, NP from 1/7 for details regarding patient's hospital stay and active issues.  Patient remains unresponsive.  His sister-in-law is at the bedside.  Apparently he did open his eyes earlier today.  Vital signs are stable.  Noted to be afebrile.  Saturating in the 90s with trach collar at 5 L/min. Coarse breath sounds are heard.  No wheezing or rhonchi. S1-S2 is normal regular.  No S3-S4. Abdomen is soft.  PEG tube is noted. Foley catheter is noted.  Unresponsive for the most part.  Patient has been treated for tracheobronchitis initially with Zosyn and more recently with cefepime.  He has completed antibiotic courses.  Noted to be afebrile.  WBC was normal when last checked.  We will repeat labs tomorrow.  Respiratory status is stable otherwise.  Remains on trach collar.  Tracheostomy tube was downsized to #6 cuffless on 02/06/2021.  Palliative care to meet with family later today.  Continue to monitor.  Stephen Delgado 02/08/2021

## 2021-02-09 LAB — CBC
HCT: 34.6 % — ABNORMAL LOW (ref 39.0–52.0)
Hemoglobin: 10.9 g/dL — ABNORMAL LOW (ref 13.0–17.0)
MCH: 24.1 pg — ABNORMAL LOW (ref 26.0–34.0)
MCHC: 31.5 g/dL (ref 30.0–36.0)
MCV: 76.5 fL — ABNORMAL LOW (ref 80.0–100.0)
Platelets: 206 10*3/uL (ref 150–400)
RBC: 4.52 MIL/uL (ref 4.22–5.81)
RDW: 16.6 % — ABNORMAL HIGH (ref 11.5–15.5)
WBC: 6.3 10*3/uL (ref 4.0–10.5)
nRBC: 0 % (ref 0.0–0.2)

## 2021-02-09 LAB — COMPREHENSIVE METABOLIC PANEL
ALT: 49 U/L — ABNORMAL HIGH (ref 0–44)
AST: 27 U/L (ref 15–41)
Albumin: 2.3 g/dL — ABNORMAL LOW (ref 3.5–5.0)
Alkaline Phosphatase: 48 U/L (ref 38–126)
Anion gap: 8 (ref 5–15)
BUN: 45 mg/dL — ABNORMAL HIGH (ref 6–20)
CO2: 27 mmol/L (ref 22–32)
Calcium: 9.5 mg/dL (ref 8.9–10.3)
Chloride: 106 mmol/L (ref 98–111)
Creatinine, Ser: 1 mg/dL (ref 0.61–1.24)
GFR, Estimated: >60 mL/min (ref >60)
Glucose, Bld: 138 mg/dL — ABNORMAL HIGH (ref 70–99)
Potassium: 4.3 mmol/L (ref 3.5–5.1)
Sodium: 141 mmol/L (ref 135–145)
Total Bilirubin: 0.4 mg/dL (ref 0.3–1.2)
Total Protein: 6.5 g/dL (ref 6.5–8.1)

## 2021-02-09 LAB — GLUCOSE, CAPILLARY
Glucose-Capillary: 123 mg/dL — ABNORMAL HIGH (ref 70–99)
Glucose-Capillary: 143 mg/dL — ABNORMAL HIGH (ref 70–99)
Glucose-Capillary: 144 mg/dL — ABNORMAL HIGH (ref 70–99)
Glucose-Capillary: 144 mg/dL — ABNORMAL HIGH (ref 70–99)
Glucose-Capillary: 154 mg/dL — ABNORMAL HIGH (ref 70–99)
Glucose-Capillary: 156 mg/dL — ABNORMAL HIGH (ref 70–99)
Glucose-Capillary: 164 mg/dL — ABNORMAL HIGH (ref 70–99)

## 2021-02-09 MED ORDER — POLYETHYLENE GLYCOL 3350 17 G PO PACK: 17.0000 g | PACK | Freq: Every day | ORAL | Status: DC | PRN

## 2021-02-09 MED ORDER — OLANZAPINE 2.5 MG PO TABS
2.5000 mg | ORAL_TABLET | Freq: Every day | ORAL | Status: DC
  Administered 2021-02-09 – 2021-02-25 (×17): 2.5 mg
  Filled 2021-02-09 (×17): qty 1

## 2021-02-09 NOTE — Progress Notes (Signed)
[facility] PROGRESS NOTE  Stephen Delgado [id number]:[id number] DOB: 01/10/1966 DOA: 12/30/2020 PCP: Patient, [name]   01/29/2021    01/29/2021                 02/04/2020  Status: Remains inpatient appropriate because:  Unsafe discharge plan.  Patient in persistent vegetative state per stroke needs placement at skilled nursing facility that is trach capable  Barriers to discharge: Social: Current insurance coverage about to expire and he otherwise has no funding for long-term care.  Clarifying regarding application for disability and Medicaid  Clinical: Tracheostomy and PEG dependent with recurrent tracheobronchitis; remains a full code  Level of care:  Med-Surg but transferring back to ICU for mechanical ventilation and stat CT of head due to neurological change as of 12/23   Code Status: Full code Family Communication: Wife at bedside 12/23; son on 1/5.  Updated on clinical changes including introduction of medications to treat brain injury sequela and hypertonicity.  Introduced Teacher, adult education of DNR to son and stated this should be considered by family but a decision was not required today since his father was otherwise medically stable.  Son aware that myself and palliative medicine team would like to meet with son and hopefully mother next week DVT prophylaxis: SCDs COVID vaccination status: Unknown  Foley catheter: 4 French inserted on 12/17; changed on 12/24 POA: No  HPI: 56 y.o. male with PMH significant for DM2, HTN, CKD with questionable compliance to medications Patient presented to the ED on 12/30/2020 with complaint of headache, altered mental status   In the ED, he was hypertensive to 238/160, agitated, required restraints CT head showed large amount of intraventricular hemorrhage involving lateral/third/fourth ventricle, ICH and left basal ganglia as well as chronic microvascular ischemic changes. He was started on Cleviprex drip and admitted to neuro  ICU   He subsequently had CT head repeated on 11/30, 12/1 and 12/3, all demonstrated unchanged intraparenchymal hemorrhage and intraventricular hemorrhage 12/3, MRI brain from also showed numerous small acute infarcts in both cerebral hemisphere with minimal improvement in the right superior cerebellum 12/3, intubated 12/13, tracheostomy 12/15, wean from vent to trach collar 12/16, PEG tube placement 12/16, repeat MRI showed an increase in the size and number of acute infarcts in the bilateral hemispheres with increased involvement in the cerebellum bilaterally.   12/18, transferred [facility] neuro service to [facility]  12/21, palliative care consulted.  Family wanted to continue aggressive care as full CODE STATUS. 12/23 developed Cheyne-Stokes respiratory pattern with respiratory alkalosis.  Transferred to ICU for short-term mechanical ventilation and was found to have new tracheobronchitis now on cefepime 12/25 back to progressive floor and to [facility]   His hospital course has been complicated by significant neurological impairment, aspiration events, AKI, hypernatremia, urinary retention.  Subjective: Essentially unresponsive.  After I manually opened his eyes they immediately closed back.  With significant tactile and verbal stimulation patient briefly and partially opened left eye but did not make any eye contact or other meaningful response.  Objective: Vitals:   02/08/21 2325 02/09/21 0330  BP: (!) 148/80 (!) 137/99  Pulse: 84 82  Resp: 16 15  Temp: 99 F (37.2 C) 98.9 F (37.2 C)  SpO2: 97% 98%    Intake/Output Summary (Last 24 hours) at 02/09/2021 0741 Last data filed at 02/09/2021 0600 Gross per 24 hour  Intake 4377 ml  Output 3825 ml  Net 552 ml   Filed Weights   02/06/21 0500 02/07/21 0500 02/09/21 0500  Weight: 80.1 kg 80.2 kg 80.1 kg    Exam:  Constitutional: Unresponsive, in no acute distress Respiratory: #8.0 Shiley cuffed trach,  Lung sounds more coarse but clear without  rhonchi today, trach dressing dry and intact.  Normal nonneurogenic respiratory pattern Cardiovascular: S1-S2, NSR, normotensive, no bilateral lower extremity edema -no further tachycardia Abdomen: no tenderness, no masses palpated.  Abdomen soft.  PEG tube -normoactive bowels rectal Foley in place with liquid brown drainage Genitourinary: Foley catheter in place draining clear yellow urine to bedside bag Neurologic: Unable to accurately test cranial nerves.  Eyes closed.  No blink reflex when tested.  Appears to have a more normal respiratory pattern today.  Upper extremities are flaccid to PROM.  Bilateral lower extremities less hypertonic with improved flexion with PROM bilaterally. Psychiatric: Unresponsive Skin: As of 12/23 patient has developed a stage II perianal wound likely from the rectal tube   Assessment/Plan: Acute problems: Acute intraparenchymal and intraventricular hemorrhage Bilateral multifocal ischemic infarcts -Imaging and admission reviewed ischemic and hemorrhagic stroke likely from combination of uncontrolled hypertension and small vessel disease.  -no improvement in multiple repeat imagings.  Follow-up MRI on 12/16 demonstrated increase in size and number of acute infarcts  -Neurology has signed off as of 12/22 as they have nothing further to offer.  Currently on no blood thinners because of intracranial hemorrhage. -Baseline has episodes of [place] and Cheyne-Stokes respiratory patterns alternating with normal respiratory pattern - continue baclofen for hypertonicity and low-dose scheduled Oxy IR for suspected underlying pain  -1/5 added Symmetrel -Continue low-dose Zyprexa at HS to treat sequela of significant brain injury -recent EEG demonstrates persistent encephalopathy but no seizures  EOL discussion -per PMT 1/8: Family reports that they have seen improvements in the past 3 weeks (details documented in PMT note) and since he is breathing without the assistance  of a ventilator they wish to continue current therapies.  Their faith is guiding them and they believe God will heal him.  They wish to continue full CODE STATUS but are agreeable to additional discussions regarding goals of care if their family member should decline further or suffer recurrent infectious events  Acute respiratory failure with hypoxia 2/2 recurrent Serratia tracheobronchitis (recent MSSA and serratia pneumonia; recent Klebsiella tracheobronchitis); -Completed Zosyn on 12/21 -12/23  acute tracheobronchitis.  Required short-term mechanical ventilation -12/28 After consultation with pharmacist de-escalated to Bactrim but given recurrent fevers and increased and pulmonary secretions pulmonary medicine recommended changing to Cefepime and obtaining sputum culture -12/30 Rpt resp cx c/w abundant Serratia and minimal amount of Klebsiella-sensitivities demonstrate both organisms sensitive to cefepime which we will continue for total of 7 days -Continue scopolamine for excessive oral secretions to help minimize risk for aspiration  Stage II perirectal pressure wound -Likely from rectal tube -Consider discontinuation of rectal tube once volume of liquid stool has decreased -Change MiraLAX to as needed from scheduled since this may be contributing to liquid stool -Continue Neutra source fiber pack  Bilateral lower extremity edema -Diuresed 7850 cc UOP after one-time dose of 40 mg IV Lasix -BUN 44 with creatinine 1.16 which is stable  Essential hypertension/EKG with LVH strain -Blood pressure on admission was elevated to over 625 systolic. -Current blood pressure much better controlled -Continue max dose Coreg, hydralazine and clonidine -Has IV Apresoline and Normodyne available prn -Suspect hypertonicity and associated pain contributing-see above regarding treatment   AKI on CKD 3b -Baseline renal function: January 2022 BUN was 18 and creatinine 1.49 -Current creatinine 1.18 with a  BUN of 37   Hypernatremia Resolved  Non obstructive transaminitis -Recurrent issue throughout hospitalization -CT abdomen and pelvis 12/23 unremarkable no evidence of cirrhosis or other biliary tree issues -LFTs improved with mild elevation in ALT of 49 as of 1/9 -Hep C antibody within normal limit -12/29 hepatitis panel unremarkable   Type 2 diabetes mellitus -A1c 6.3 in 12/31/2020 -Continue Levemir 20 units BID along with SSI every 4 hours -CBGs well controlled   Protein calorie malnutrition/dysphagia Nutrition Problem: Inadequate oral intake Etiology: lethargy/confusion, dysphagia Signs/Symptoms: NPO status Interventions: Tube feeding via PEG Body mass index is 23.95 kg/m.    Acute urinary retention  -Continue Urecholine -Currently has Foley catheter.  Last exchanged on 12/23.   -1/5 needed every 4 hours on/off clamping schedule.  On 1/9 will increase to clamp every 6 hours alternating with unclamp for 2 hours   Goals of care -Poor prognosis.  No neurological recovery in last several days.  In fact repeat MRI on 12/16 shows worsening stroke compared to scan on admission.  -Multiple discussions held with family per pulmonary medicine team as well as palliative medicine. They want to continue aggressive care with FULL CODE STATUS at this time.      Data Reviewed: Basic Metabolic Panel: Recent Labs  Lab 02/03/21 0751 02/06/21 0056 02/09/21 0434  NA 138 139 141  K 4.0 4.0 4.3  CL 105 108 106  CO2 _0 GLUCOSE 139* 136* 138*  BUN 44* 52* 45*  CREATININE 1.16 1.05 1.00  CALCIUM 8.9 8.8* 9.5    CBC: Recent Labs  Lab 02/03/21 0751 02/09/21 0434  WBC 8.3 6.3  NEUTROABS 5.4  --   HGB 10.6* 10.9*  HCT 32.8* 34.6*  MCV 75.2* 76.5*  PLT 262 206     CBG: Recent Labs  Lab 02/08/21 1159 02/08/21 1542 02/08/21 1924 02/09/21 0000 02/09/21 0404  GLUCAP 148* 158* 132* 144* 123*    Recent Results (from the past 240 hour(s))  Culture, Respiratory w  Gram Stain     Status: None   Collection Time: 01/30/21 11:18 AM   Specimen: Tracheal Aspirate; Respiratory  Result Value Ref Range Status   Specimen Description TRACHEAL ASPIRATE  Final   Special Requests NONE  Final   Gram Stain   Final    ABUNDANT WBC PRESENT,BOTH PMN AND MONONUCLEAR MODERATE GRAM POSITIVE COCCI RARE GRAM NEGATIVE RODS RARE GRAM VARIABLE ROD    Culture   Final    ABUNDANT SERRATIA MARCESCENS FEW KLEBSIELLA PNEUMONIAE MODERATE STREPTOCOCCUS GROUP F Beta hemolytic streptococci are predictably susceptible to penicillin and other beta lactams. Susceptibility testing not routinely performed. Performed at [facility], [place] [address] [place], [place] [address]    Report Status 02/02/2021 FINAL  Final   Organism ID, Bacteria SERRATIA MARCESCENS  Final   Organism ID, Bacteria KLEBSIELLA PNEUMONIAE  Final      Susceptibility   Klebsiella pneumoniae - MIC*    AMPICILLIN >=32 RESISTANT Resistant     CEFAZOLIN <=4 SENSITIVE Sensitive     CEFEPIME <=0.12 SENSITIVE Sensitive     CEFTAZIDIME <=1 SENSITIVE Sensitive     CEFTRIAXONE <=0.25 SENSITIVE Sensitive     CIPROFLOXACIN <=0.25 SENSITIVE Sensitive     GENTAMICIN <=1 SENSITIVE Sensitive     IMIPENEM <=0.25 SENSITIVE Sensitive     TRIMETH/SULFA <=20 SENSITIVE Sensitive     AMPICILLIN/SULBACTAM 8 SENSITIVE Sensitive     PIP/TAZO <=4 SENSITIVE Sensitive     * FEW KLEBSIELLA PNEUMONIAE  Serratia marcescens - MIC*    CEFAZOLIN >=64 RESISTANT Resistant     CEFEPIME <=0.12 SENSITIVE Sensitive     CEFTAZIDIME <=1 SENSITIVE Sensitive     CEFTRIAXONE <=0.25 SENSITIVE Sensitive     CIPROFLOXACIN <=0.25 SENSITIVE Sensitive     GENTAMICIN <=1 SENSITIVE Sensitive     TRIMETH/SULFA <=20 SENSITIVE Sensitive     * ABUNDANT SERRATIA MARCESCENS     Studies: No results found.  Scheduled Meds:  amantadine  100 mg Per Tube BID   baclofen  5 mg Per Tube TID AC & HS   bethanechol  25 mg Per Tube TID   carvedilol  25 mg  Per Tube BID WC   chlorhexidine gluconate (MEDLINE KIT)  15 mL Mouth Rinse BID   Chlorhexidine Gluconate Cloth  6 each Topical Q0600   cloNIDine  0.1 mg Per Tube Q8H   fiber  1 packet Per Tube BID   free water  100 mL Per Tube Q6H   heparin injection (subcutaneous)  5,000 Units Subcutaneous Q8H   hydrALAZINE  50 mg Per Tube Q8H   insulin aspart  0-20 Units Subcutaneous Q4H   insulin detemir  20 Units Subcutaneous BID   mouth rinse  15 mL Mouth Rinse 10 times per day   nutrition supplement (JUVEN)  1 packet Per Tube BID BM   OLANZapine  5 mg Per Tube QHS   oxyCODONE  2.5 mg Per Tube Q8H   pantoprazole sodium  40 mg Per Tube Daily   polyethylene glycol  17 g Per Tube Daily   scopolamine  1 patch Transdermal Q72H   Continuous Infusions:  feeding supplement (GLUCERNA 1.5 CAL) 60 mL/hr at 02/09/21 0600    Principal Problem:   ICH (intracerebral hemorrhage) ([place]) Active Problems:   Malignant hypertension   Diabetes mellitus type 2 in obese ([place])   Acute respiratory failure ([place])   Tracheostomy dependence ([place])   Pneumonia due to Serratia marcescens ([place])   MSSA (methicillin susceptible Staphylococcus aureus) pneumonia ([place])   Stage 3b chronic kidney disease (CKD) ([place])   Acute kidney injury ([place])   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 ([place])   Acute hypernatremia   Dysphagia due to recent stroke   Acute urinary retention   Respiration abnormal   Pressure injury of skin   Abdominal distention   Intractable hiccups   Pneumonia due to Klebsiella pneumoniae ([place])   Swollen lip   Fever   Muscular hypertonicity   Consultants: Neurology PCCM Medicine  Procedures: Echocardiogram EEG Cortrack PEG tube placement Tracheostomy tube placement  Antibiotics: Zosyn 12/15 through 12/21 Cefepime 12/24 through 12/27 Bactrim 12/27 >>   Time spent: 45 minutes    Erin Hearing ANP  [facility] 7 am - 330 pm/M-F for direct patient care  and secure chat Please refer to Amion for contact info 41  days

## 2021-02-09 NOTE — Progress Notes (Signed)
NAME:  Stephen Delgado, MRN:  [id number], DOB:  May 30, 1965, LOS: 54 ADMISSION DATE:  12/30/2020, CONSULTATION DATE:  12/19 REFERRING MD:  Erlinda Hong, CHIEF COMPLAINT:  Dyspnea   History of Present Illness:  56 yo male presented with headache, nausea and altered mental status.  CT head showed acute Lt ICH with IVH likely related to hypertensive emergency (BP 195/116).  Had persistent hypertension and concern for airway protection, and PCCM asked to assist with ICU management.  Tracheostomy 12/13.  Moved out of ICU.   Pertinent  Medical History  HTN, DM type 2, CKD 3a, OSA  Significant Hospital Events: Including procedures, antibiotic start and stop dates in addition to other pertinent events   11/29 Admit, start cleviprex 12/01 PCCM consulted; changed from cleviprex to cardene due to elevated triglycerides 12/03 intubated; episode of vomiting >> tube feeds held 12/04 resume trickle tube feeds 12/06 remains minimally responsive. Tolerating SBT.  12/12 no acute events overnight, T-max 102 point 12/13 bedside trach planned midmorning  12/14 ATC. Lasix --> 5L UOP  12/15 cont on trach collar. Na to 154 from 145. Adding low rate d5  12/16 scheduled for PEG. Cont D5. Continues on trach collar Out of ICU 12/19 12/23 Cheyne-Stokes respirations noted and was transferred to ICU for short-term ventilation.  12/25 Returned to floor  Imaging/Studies: CT head 11/29 >> large amount of IVH involving lateral/3rd/4th ventricles, ICH in Lt BG, chronic microvascular ischemic changes Echo 11/30 >> EF 50 to 55%, mod LVH, grade 1 DD, ascending aorta 37 mm EEG 12/01 >> continuous generalized slowing MRI brain 12/03 >> numerous small acute infarcts in b/l cerebral hemispheres, Lt caudate hemorrhage, IVH, scattered sulcal SAH, mild communicating hydrocephalus CT head 12/23 Expected evolution of strokes, no shift or herniation   Interim History / Subjective:   Remains on 21% trach collar Afebrile  Objective    Blood pressure (!) 148/86, pulse 83, temperature 98.7 F (37.1 C), temperature source Axillary, resp. rate 16, height 6' (1.829 m), weight 80.1 kg, SpO2 96 %.    FiO2 (%):  [21 %-28 %] 21 %   Intake/Output Summary (Last 24 hours) at 02/09/2021 1331 Last data filed at 02/09/2021 0600 Gross per 24 hour  Intake 4377 ml  Output 2825 ml  Net 1552 ml    Filed Weights   02/06/21 0500 02/07/21 0500 02/09/21 0500  Weight: 80.1 kg 80.2 kg 80.1 kg     General:  unresponsive, chronically ill-appearing M trach dependent  HEENT: MM pink/dry, swollen lower lip, sz 8 cuffed trach in place Neuro: Comatose, does not respond to deep pain stimulus CV: s1s2 rrr, no m/r/g PULM: Decreased breath sounds bilateral, no accessory muscle use GI: soft, bsx4 active, mildly distended Extremities: warm/dry, no edema  Skin: no rashes or lesions  Labs show normal electrolytes, no leukocytosis Chest x-ray 12/26 shows stable mid left basal opacity favoring atelectasis   Resolved Hospital Problem list     Assessment & Plan:   #Persistent vegetative state # Acute basal left ganglia intraparenchymal hemorrhage with intraventricular hemorrhage in the setting of hypertensive emergency # Bilateral multiple ischemic strokes likely embolic in nature # Hypertension # Poorly controlled diabetes # Moderate protein calorie malnutrition  -Poor prognosis has been conveyed to family and palliative conversations ongoing   # Acute respiratory failure with hypoxemia, resolved # VAP , Serratia, Klebsiella and strep , completed course of cefepime - Continue Routine trach care -Trach downsized to #6 cuffless trach - Pulmonary hygiene: CPT, albuterol -On scopolamine patch  Code Status:  full code Last date of multidisciplinary goals of care discussion [1/8, palliative care ]  PCCM will see once a week  Critical care time: N/A    Kara Mead MD. FCCP. [facility] Pager : [phone]  If  no response to pager , please call [phone] until 7 pm After 7:00 pm call Elink  [phone]   02/09/2021

## 2021-02-09 NOTE — Progress Notes (Addendum)
RE: Stephen Delgado, Stephen Delgado  Date of Birth: 10/12/1965  Date: 02/09/2021  To Whom It May Concern:  Please be advised that the above-named patient will require a short-term nursing home stay - anticipated 30 days or less for rehabilitation and strengthening. The plan is for return home.  MD signature:

## 2021-02-09 NOTE — Procedures (Signed)
Tracheostomy Change Note  Patient Details:   Name: Stephen Delgado DOB: Oct 19, 1965 MRN: [id number]    Airway Documentation:     Evaluation  O2 sats: stable throughout Complications: No apparent complications Patient did tolerate procedure well. Bilateral Breath Sounds: Diminished, Rhonchi  Patient's trach was downsized to #6 cuffless shiley per CCM orders without any complications with 2 RT assisting. Positive color change noted on CO2 detector. Lurline Idol was secured with trach ties.     Claretta Fraise 02/09/2021, 3:34 PM

## 2021-02-09 NOTE — TOC Progression Note (Addendum)
Transition of Care [facility]) - Progression Note    Patient Details  Name: Stephen Delgado MRN: [id number] Date of Birth: 03-12-1965  [facility] CM/SW Contact  Curlene Labrum, RN Phone Number: 02/09/2021, 9:16 AM  Clinical Narrative:    CM left a secure email for [facility] financially counseling to assist the patient's family with Medicaid screening and application.  I called and was unable to reach the patient's wife, [name] Guyette, by phone and left a voicemail for follow up regarding assistance with financial counseling, establishment of insurance through wife's employer and discussion regarding SNF placement and/or LTC options.  I called and left a voicemail with the patient's son, Neilan Rizzo and asked for return phone call to follow up on Medicaid screening though financial counseling.  Palliative Care met with the patient's family this weekend and family is requesting assistance with Medicaid and SNF/LTC placement.  02/10/2020 1422 - CM completed 30-day note with physician signature and all required clinicals uploaded for needed PASRR approval for placement.  CM spoke with [place], CM with financial counseling and she states that she will speak with the family today to assist with Medicaid screening and application.  Voicemail was left with the patient's son, Daymion Nazaire for follow up.  I left a message with Gayla Medicus, Admission Director with [facility] but she has no available bed at this time.  CM and MSW will continue to follow the patient for SNF placement.   Expected Discharge Plan: [place] Barriers to Discharge: Continued Medical Work up  Expected Discharge Plan and Services Expected Discharge Plan: [place]   Discharge Planning Services: CM Consult Post Acute Care [facility]: [place] Living arrangements for the past 2 months: Single Family Home                                       Social  Determinants of Health (SDOH) Interventions    Readmission Risk Interventions No flowsheet data found.

## 2021-02-09 NOTE — Plan of Care (Signed)
?  Problem: Clinical Measurements: ?Goal: Will remain free from infection ?Outcome: Progressing ?Goal: Diagnostic test results will improve ?Outcome: Progressing ?Goal: Respiratory complications will improve ?Outcome: Progressing ?  ?

## 2021-02-10 LAB — GLUCOSE, CAPILLARY
Glucose-Capillary: 140 mg/dL — ABNORMAL HIGH (ref 70–99)
Glucose-Capillary: 143 mg/dL — ABNORMAL HIGH (ref 70–99)
Glucose-Capillary: 159 mg/dL — ABNORMAL HIGH (ref 70–99)
Glucose-Capillary: 114 mg/dL — ABNORMAL HIGH (ref 70–99)

## 2021-02-10 NOTE — Progress Notes (Signed)
[facility] PROGRESS NOTE  Stephen Delgado QJJ:[id number] DOB: 02-17-1965 DOA: 12/30/2020 PCP: Patient, [name]   01/29/2021    01/29/2021                 02/04/2020  Status: Remains inpatient appropriate because:  Unsafe discharge plan.  Patient in persistent vegetative state per stroke needs placement at skilled nursing facility that is trach capable-medically stable for discharge noting trach has been in place now for 30 days  Barriers to discharge: Social: Current insurance coverage about to expire and he otherwise has no funding for long-term care.  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  PMT held extensive conversation with family regarding goals of care on 1/8.  Wife updated regarding discharge planning per [facility] team on 1/10. DVT prophylaxis: SCDs COVID vaccination status: Unknown  Foley catheter: 50 French inserted on 12/17; changed on 12/24 POA: No  HPI: 56 y.o. male with PMH significant for DM2, HTN, CKD with questionable compliance to medications Patient presented to the ED on 12/30/2020 with complaint of headache, altered mental status   In the ED, he was hypertensive to 238/160, agitated, required restraints CT head showed large amount of intraventricular hemorrhage involving lateral/third/fourth ventricle, ICH and left basal ganglia as well as chronic microvascular ischemic changes. He was started on Cleviprex drip and admitted to neuro ICU   He subsequently had CT head repeated on 11/30, 12/1 and 12/3, all demonstrated unchanged intraparenchymal hemorrhage and intraventricular hemorrhage 12/3, MRI brain from also showed numerous small acute infarcts in both cerebral hemisphere with minimal improvement  in the right superior cerebellum 12/3, intubated 12/13, tracheostomy 12/15, wean from vent to trach collar 12/16, PEG tube placement 12/16, repeat MRI showed an increase in the size and number of acute infarcts in the bilateral hemispheres with increased involvement in the cerebellum bilaterally.   12/18, transferred from neuro service to [facility]  12/21, palliative care consulted.  Family wanted to continue aggressive care as full CODE STATUS. 12/23 developed Cheyne-Stokes respiratory pattern with respiratory alkalosis.  Transferred to ICU for short-term mechanical ventilation and was found to have new tracheobronchitis now on cefepime 12/25 back to progressive floor and to [facility]   His hospital course has been complicated by significant neurological impairment, aspiration events, AKI, hypernatremia, urinary retention. 1/9 trach changed to #6.0 Shiley cuffless--1/10 > 30 days since trach inserted.  Subjective: Unresponsive.  After extensive verbal and tactile stimulation patient briefly opened eyes but blink was absent sent did not attempt to track persons in room and quickly closes eyes back.  Objective: Vitals:   02/10/21 0400 02/10/21 0745  BP: 132/84   Pulse:  90  Resp: 16   Temp: 98.8 F (37.1 C)   SpO2: 96%     Intake/Output Summary (Last 24 hours) at 02/10/2021 0746 Last data filed at 02/10/2021 0200 Gross per 24 hour  Intake 3100 ml  Output 1250 ml  Net 1850 ml   Filed Weights   02/06/21 0500 02/07/21 0500 02/09/21 0500  Weight: 80.1 kg 80.2 kg 80.1 kg    Exam:  Constitutional: Unresponsive, NAD Respiratory: #6.0 Shiley cuffless trach,  Lung sounds more coarse but clear without rhonchi today, trach with foamy tan foul-smelling expectorant noted and trach collar apparatus, normal nonneurogenic respiratory pattern  Cardiovascular: S1-S2, NSR, normotensive, no bilateral lower extremity edema -no further tachycardia Abdomen:  Abdomen soft and nontender.  PEG tube -normoactive  bowels -rectal Foley in place with liquid brown drainage Genitourinary: Foley catheter in place draining clear yellow urine to bedside bag-clamping trial in progress Neurologic: Unable to accurately test cranial nerves.  Eyes closed.  No blink reflex when tested.  Appears to have a more normal respiratory pattern today.  Upper extremities are flaccid to PROM.  Bilateral lower extremities less hypertonic with improved flexion with PROM bilaterally. Psychiatric: Unresponsive   Assessment/Plan: Acute problems: Brain injury 2/2 acute intraparenchymal and intraventricular hemorrhage/Bilateral multifocal ischemic infarcts -Imaging at admission revealed ischemic and hemorrhagic stroke likely from combination of uncontrolled hypertension and small vessel disease.  -Follow-up MRI on 12/16 demonstrated increase in size and number of acute infarcts  -Neurology signed off -currently not on blood thinners because of intracranial hemorrhage. -Has episodes of [place] and Cheyne-Stokes respiratory patterns alternating with normal respiratory pattern -Continue baclofen for hypertonicity and low-dose scheduled Oxy IR for suspected underlying pain  - continue Symmetrel and Zyprexa -recent EEG demonstrates persistent encephalopathy but no seizures  EOL discussion -per PMT 1/8: Family reports that they have seen improvements in the past 3 weeks (details documented in PMT note) and since he is breathing without the assistance of a ventilator they wish to continue current therapies.  Their faith is guiding them and they believe God will heal him.  They wish to continue full CODE STATUS but are agreeable to additional discussions regarding goals of care if their family member should decline further or suffer recurrent infectious events  Acute respiratory failure with hypoxia 2/2 recurrent Serratia tracheobronchitis (recent MSSA and serratia pneumonia; recent Klebsiella tracheobronchitis); -Completed Zosyn on  12/21 -12/23 recurrent acute tracheobronchitis-completed 7 days of Cefepime as rec by PCCM based on cxs -Continue scopolamine for excessive oral secretions to help minimize risk for aspiration  Stage II perirectal pressure wound -Likely from rectal tube -Consider discontinuation of rectal tube once volume of liquid stool has decreased -Change MiraLAX to as needed from scheduled since this may be contributing to liquid stool -Continue Neutra source fiber pack  Bilateral lower extremity edema -Diuresed 7850 cc UOP after one-time dose of 40 mg IV Lasix -BUN 44 with creatinine 1.16 which is stable  Essential hypertension/EKG with LVH strain -Blood pressure on admission was elevated to over 696 systolic. -Current blood pressure much better controlled -Continue max dose Coreg, hydralazine and clonidine -Has IV Apresoline and Normodyne available prn -Suspect hypertonicity and associated pain contributing-see above regarding treatment   AKI on CKD 3b -Baseline renal function: January 2022 BUN was 18 and creatinine 1.49 -Current creatinine 1.18 with a BUN of 37   Hypernatremia Resolved  Non obstructive transaminitis -Recurrent issue throughout hospitalization -CT abdomen and pelvis 12/23 unremarkable no evidence of cirrhosis or other biliary tree issues -LFTs improved with mild elevation in ALT of 49 as of 1/9 -Hep C antibody within normal limit -12/29 hepatitis panel unremarkable   Type 2 diabetes mellitus -A1c 6.3 in 12/31/2020 -Continue Levemir 20 units BID along with SSI every 4 hours -CBGs well controlled   Protein calorie malnutrition/dysphagia Nutrition Problem: Inadequate oral intake Etiology: lethargy/confusion, dysphagia Signs/Symptoms: NPO status Interventions: Tube feeding via PEG Body mass index is 23.95 kg/m.    Acute urinary retention  -Continue Urecholine -Currently has Foley catheter.  Last exchanged on 12/23.   -1/5 needed every 4 hours on/off clamping  schedule.  On 1/9 will increase to  clamp every 6 hours alternating with unclamp for 2 hours   Goals of care -Poor prognosis.  No neurological recovery in last several days.  In fact repeat MRI on 12/16 shows worsening stroke compared to scan on admission.  -Multiple discussions held with family per pulmonary medicine team as well as palliative medicine. They want to continue aggressive care with FULL CODE STATUS at this time.      Data Reviewed: Basic Metabolic Panel: Recent Labs  Lab 02/03/21 0751 02/06/21 0056 02/09/21 0434  NA 138 139 141  K 4.0 4.0 4.3  CL 105 108 106  CO2 '25 26 27  ' GLUCOSE 139* 136* 138*  BUN 44* 52* 45*  CREATININE 1.16 1.05 1.00  CALCIUM 8.9 8.8* 9.5    CBC: Recent Labs  Lab 02/03/21 0751 02/09/21 0434  WBC 8.3 6.3  NEUTROABS 5.4  --   HGB 10.6* 10.9*  HCT 32.8* 34.6*  MCV 75.2* 76.5*  PLT 262 206     CBG: Recent Labs  Lab 02/09/21 1203 02/09/21 1644 02/09/21 2020 02/09/21 2333 02/10/21 0340  GLUCAP 156* 143* 164* 144* 140*    No results found for this or any previous visit (from the past 240 hour(s)).    Studies: No results found.  Scheduled Meds:  amantadine  100 mg Per Tube BID   baclofen  5 mg Per Tube TID AC & HS   bethanechol  25 mg Per Tube TID   carvedilol  25 mg Per Tube BID WC   chlorhexidine gluconate (MEDLINE KIT)  15 mL Mouth Rinse BID   Chlorhexidine Gluconate Cloth  6 each Topical Q0600   cloNIDine  0.1 mg Per Tube Q8H   fiber  1 packet Per Tube BID   free water  100 mL Per Tube Q6H   heparin injection (subcutaneous)  5,000 Units Subcutaneous Q8H   hydrALAZINE  50 mg Per Tube Q8H   insulin aspart  0-20 Units Subcutaneous Q4H   insulin detemir  20 Units Subcutaneous BID   mouth rinse  15 mL Mouth Rinse 10 times per day   nutrition supplement (JUVEN)  1 packet Per Tube BID BM   OLANZapine  2.5 mg Per Tube Daily   OLANZapine  5 mg Per Tube QHS   oxyCODONE  2.5 mg Per Tube Q8H   pantoprazole sodium  40 mg  Per Tube Daily   scopolamine  1 patch Transdermal Q72H   Continuous Infusions:  feeding supplement (GLUCERNA 1.5 CAL) 1,000 mL (02/09/21 1657)    Principal Problem:   ICH (intracerebral hemorrhage) ([place]) Active Problems:   Malignant hypertension   Diabetes mellitus type 2 in obese ([place])   Acute respiratory failure ([place])   Tracheostomy dependence ([place])   Pneumonia due to Serratia marcescens ([place])   MSSA (methicillin susceptible Staphylococcus aureus) pneumonia ([place])   Stage 3b chronic kidney disease (CKD) ([place])   Acute kidney injury ([place])   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 ([place])   Acute hypernatremia   Dysphagia due to recent stroke   Acute urinary retention   Respiration abnormal   Pressure injury of skin   Abdominal distention   Intractable hiccups   Pneumonia due to Klebsiella pneumoniae ([place])   Swollen lip   Fever   Muscular hypertonicity   Consultants: Neurology PCCM Medicine  Procedures: Echocardiogram EEG Cortrack PEG tube placement Tracheostomy tube placement  Antibiotics: Zosyn 12/15 through 12/21 Cefepime 12/24 through 12/27 Bactrim 12/27 >>   Time spent: 45 minutes  Erin Hearing ANP  [facility] 7 am - 330 pm/M-F for direct patient care and secure chat Please refer to Amion for contact info 42  days

## 2021-02-10 NOTE — NC FL2 (Signed)
[place] LEVEL OF CARE SCREENING TOOL     IDENTIFICATION  Patient Name: Stephen Delgado Birthdate: 1965/11/28 Sex: male Admission Date (Current Location): 12/30/2020  [facility] and [place] Number:  Herbalist and Address:  [facility] [place] [place] [address], [place], [place] [address]      Provider Number: [id number]  Attending Physician Name and Address:  Bonnielee Haff, MD  Relative Name and Phone Number:  [name] Fusaro - spouse - [phone]    Current Level of Care: Hospital Recommended Level of Care: [place] Prior Approval Number:    Date Approved/Denied:   PASRR Number: [id number]  Discharge Plan: SNF    Current Diagnoses: Patient Active Problem List   Diagnosis Date Noted   Muscular hypertonicity    Fever    Pneumonia due to Klebsiella pneumoniae ([place])    Swollen lip    Intractable hiccups    Abdominal distention    Tracheostomy dependence ([place]) 01/23/2021   Pneumonia due to Serratia marcescens ([place]) 01/23/2021   MSSA (methicillin susceptible Staphylococcus aureus) pneumonia ([place]) 01/23/2021   Stage 3b chronic kidney disease (CKD) ([place]) 01/23/2021   Acute kidney injury ([place]) 01/23/2021   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) 02/28/2016   Healthcare maintenance 02/08/2016   Sinus congestion 04/28/2015   Diabetes mellitus ([place]) 04/28/2015   Malignant hypertension 04/19/2015   Abnormal EKG 04/19/2015   Chronic kidney disease (CKD) 04/19/2015   Microcytosis 04/19/2015    Orientation RESPIRATION BLADDER Height & Weight      (unresponsive)   Tracheostomy (8 mm cuffed Shiley trach at present - 21% humified) Indwelling catheter Weight: 80.1 kg Height:  6' (182.9 cm)  BEHAVIORAL SYMPTOMS/MOOD NEUROLOGICAL BOWEL NUTRITION STATUS      Incontinent Feeding tube  AMBULATORY STATUS COMMUNICATION OF NEEDS Skin   Total Care Does not communicate PU Stage and Appropriate Care (stage 2 - medial anus - 2 red open areas)   PU Stage 2 Dressing: No Dressing                   Personal Care Assistance Level of Assistance  Bathing, Feeding, Dressing, Total care Bathing Assistance: Maximum assistance Feeding assistance: Maximum assistance (Tube feedings via gastrostomy tube) Dressing Assistance: Maximum assistance Total Care Assistance: Maximum assistance   Functional Limitations Info  Sight, Hearing, Speech Sight Info: Impaired (unresponsive) Hearing Info: Impaired (unresponsive) Speech Info: Impaired (unresponsive)    SPECIAL CARE FACTORS FREQUENCY  PT (By licensed PT), OT (By licensed OT)     PT Frequency: 2-3 times per week OT Frequency: 2-3 times per week            Contractures Contractures Info: Not present    Additional Factors Info  Code Status, Allergies, Psychotropic, Insulin Sliding Scale Code Status Info: Full code Allergies Info: NKDA Psychotropic Info: Zyprexa Insulin Sliding Scale Info: see discharge summary       Current Medications (02/10/2021):  This is the current hospital active medication list Current Facility-Administered Medications  Medication Dose Route Frequency Provider Last Rate Last Admin   acetaminophen (TYLENOL) tablet 650 mg  650 mg Oral Q4H PRN Lindzen,  Randall Hiss, MD   650 mg at 01/20/21 8119   Or   acetaminophen (TYLENOL) 160 MG/5ML solution 650 mg  650 mg Per Tube Q4H PRN Kerney Elbe, MD   650 mg at 02/03/21 1478   Or   acetaminophen (TYLENOL) suppository 650 mg  650 mg Rectal Q4H PRN Kerney Elbe, MD   650 mg at 01/13/21 0430   albuterol (PROVENTIL) (2.5 MG/3ML) 0.083% nebulizer  solution 2.5 mg  2.5 mg Nebulization Q6H PRN Spero Geralds, MD       amantadine (SYMMETREL) 50 MG/5ML solution 100 mg  100 mg Per Tube BID Samella Parr, NP   100 mg at 02/10/21 0925   baclofen (LIORESAL) tablet 5 mg  5 mg Per Tube TID AC & HS Samella Parr, NP   5 mg at 02/10/21 2956   bethanechol (URECHOLINE) tablet 25 mg  25 mg Per Tube TID Cristal Generous, NP   25 mg at 02/10/21 0926   carvedilol (COREG) tablet 25 mg  25 mg Per Tube BID WC Agarwala, Einar Grad, MD   25 mg at 02/10/21 2130   chlorhexidine gluconate (MEDLINE KIT) (PERIDEX) 0.12 % solution 15 mL  15 mL Mouth Rinse BID Olalere, Adewale A, MD   15 mL at 02/10/21 0930   Chlorhexidine Gluconate Cloth 2 % PADS 6 each  6 each Topical Q0600 Amie Portland, MD   6 each at 02/10/21 0930   cloNIDine (CATAPRES) tablet 0.1 mg  0.1 mg Per Tube Lina Sar, MD   0.1 mg at 02/10/21 0520   docusate (COLACE) 50 MG/5ML liquid 100 mg  100 mg Per Tube Daily PRN Chesley Mires, MD       feeding supplement (GLUCERNA 1.5 CAL) liquid 1,000 mL  1,000 mL Per Tube Continuous Starla Link, Kshitiz, MD 60 mL/hr at 02/09/21 1657 1,000 mL at 02/09/21 1657   fiber (NUTRISOURCE FIBER) 1 packet  1 packet Per Tube BID Maryjane Hurter, MD   1 packet at 02/10/21 0926   free water 100 mL  100 mL Per Tube Q6H Samella Parr, NP   100 mL at 02/10/21 0520   heparin injection 5,000 Units  5,000 Units Subcutaneous Q8H Agarwala, Einar Grad, MD   5,000 Units at 02/10/21 0519   hydrALAZINE (APRESOLINE) injection 10-40 mg  10-40 mg Intravenous Q6H PRN Cristal Generous, NP   20 mg at 02/04/21 0454   hydrALAZINE (APRESOLINE) tablet 50 mg  50 mg Per Tube Q8H Samella Parr, NP   50 mg at 02/10/21 0520   influenza vac split quadrivalent PF (FLUARIX) injection 0.5 mL  0.5 mL Intramuscular Prior to discharge Dahal, Marlowe Aschoff, MD       insulin aspart (novoLOG) injection 0-20 Units  0-20 Units Subcutaneous Q4H Bowser, Laurel Dimmer, NP   3 Units at 02/10/21 0930   insulin detemir (LEVEMIR)  injection 20 Units  20 Units Subcutaneous BID Jacky Kindle, MD   20 Units at 02/10/21 0931   labetalol (NORMODYNE) injection 5-20 mg  5-20 mg Intravenous Q10 min PRN Rosalin Hawking, MD   20 mg at 01/25/21 0544   MEDLINE mouth rinse  15 mL Mouth Rinse 10 times per day Sherrilyn Rist A, MD   15 mL at 02/10/21 8657   nutrition supplement (JUVEN) (JUVEN) powder packet 1 packet  1 packet Per Tube BID BM Aline August, MD   1 packet at 02/10/21 0926   OLANZapine (ZYPREXA) tablet 2.5 mg  2.5 mg Per Tube Daily Erin Hearing  L, NP   2.5 mg at 02/10/21 0927   OLANZapine (ZYPREXA) tablet 5 mg  5 mg Per Tube QHS Samella Parr, NP   5 mg at 02/09/21 2115   ondansetron (ZOFRAN) injection 4 mg  4 mg Intravenous Q6H PRN Chesley Mires, MD       oxyCODONE (ROXICODONE) 5 MG/5ML solution 2.5 mg  2.5 mg Per Tube Q8H Samella Parr, NP   2.5 mg at 02/10/21 0520   pantoprazole sodium (PROTONIX) 40 mg/20 mL oral suspension 40 mg  40 mg Per Tube Daily Rosalin Hawking, MD   40 mg at 02/10/21 0924   polyethylene glycol (MIRALAX / GLYCOLAX) packet 17 g  17 g Per Tube Daily PRN Samella Parr, NP       scopolamine (TRANSDERM-SCOP) 1 MG/3DAYS 1.5 mg  1 patch Transdermal Q72H Samella Parr, NP   1.5 mg at 02/10/21 0932     Discharge Medications: Please see discharge summary for a list of discharge medications.  Relevant Imaging Results:  Relevant Lab Results:   Additional Information SS# [id number]  Curlene Labrum, RN

## 2021-02-10 NOTE — TOC Progression Note (Addendum)
Transition of Care [facility]) - Progression Note    Patient Details  Name: Stephen Delgado MRN: [id number] Date of Birth: 1965-08-04  [facility]) CM/SW Contact  [name], RN Phone Number: 02/10/2021, 10:52 AM  Clinical Narrative:    CM called and left a voicemail message with the patient's son, Stephen Delgado.  The patient's son states that he was able to speak with financial counseling last night and provided the patient's wife with the forms to follow up with financial counseling for Medicaid screening and application.  I called and spoke with the patient's wife, Stephen Delgado on the phone and she is currently on medical leave from [employer] hospital, her current employer.  [name] was able to verbalize that she would like the patient placed in a nursing home for care.  I asked that the wife follow up to provide the patient with insurance through her current insurance provider.  I explained that it might be helpful for her to speak with her supervisor of her department with assistance in speaking with Human resources to add the patient to her policy.  I explained to the wife that the patient currently does not have an active insurance provider to pay for his care in the hospital or disposition to a skilled nursing home once discharged.  I explained that the patient is medically clear and that [facility] Team will be exploring SNF facilities for admission.  [name] verbalizes understanding that we will attempt to place the patient locally but without a payor source that the patient may need placement outside the immediate area for care.  The wife explained that she plans to follow up with financial counseling.  She states that "I believe my husband is disabled and should be able to receive disability and Medicaid".  I provided support that financial counseling would provider her with the resources to screen for Medicaid at this time once she is able to provide the documents  to financial counseling.  I again encouraged her to add the patient to her insurance provider.  [name] states that she owns her home and that her husband has worked for the past 20 years.  She states that she is unable to afford to pay for privately for SNF placement.  I offered that the patient might be able to return home under care of the family as another option.  CM called and spoke with [facility] and [facility] in [place], [place] and the facilities are full and unable to review the clinicals for possible placement at either facility.  I called and left a message with Mellissa, CM at [facility].  CM and MSW with [facility] will continue to follow the patient for SNF placement.  02/10/2021 1230 - CM spoke with Domingo Madeira, CM at [facility] in [place], [place] and the facility has bed availability for management of patient's complex care needs.  I sent the patient's clinicals to [email or web address] - including MAR, H&P, FL2, progress notes, RT notes and neurology notes.  Kathrine Cords, CM at the facility states that the facility is aware that LOG to be provided until the patient's insurance or [place] Medicaid is applied and approved at a later date.  I will update the family if the facility is able to offer bed offer to the family for placement.   Expected Discharge Plan: Skilled Nursing Facility Barriers to Discharge: Continued Medical Work up  Expected Discharge Plan and Services Expected Discharge Plan: Skilled Nursing  Facility   Discharge Planning Services: CM Consult Post Acute Care Choice: Skilled Nursing Facility Living arrangements for the past 2 months: Single Family Home                                       Social Determinants of Health (SDOH) Interventions    Readmission Risk Interventions No flowsheet data found.

## 2021-02-10 NOTE — Plan of Care (Signed)
  Problem: Clinical Measurements: Goal: Will remain free from infection Outcome: Progressing Goal: Diagnostic test results will improve Outcome: Progressing Goal: Respiratory complications will improve Outcome: Progressing Goal: Cardiovascular complication will be avoided Outcome: Progressing   

## 2021-02-11 LAB — GLUCOSE, CAPILLARY
Glucose-Capillary: 134 mg/dL — ABNORMAL HIGH (ref 70–99)
Glucose-Capillary: 137 mg/dL — ABNORMAL HIGH (ref 70–99)
Glucose-Capillary: 141 mg/dL — ABNORMAL HIGH (ref 70–99)
Glucose-Capillary: 158 mg/dL — ABNORMAL HIGH (ref 70–99)
Glucose-Capillary: 177 mg/dL — ABNORMAL HIGH (ref 70–99)
Glucose-Capillary: 185 mg/dL — ABNORMAL HIGH (ref 70–99)

## 2021-02-11 NOTE — Progress Notes (Signed)
[facility] PROGRESS NOTE  Stephen Delgado GHW:[id number] DOB: [birth year] DOA: 12/30/2020 PCP: Patient, [name]   01/29/2021    01/29/2021                 02/04/2020  Status: Remains inpatient appropriate because:  Unsafe discharge plan.  Patient in persistent vegetative state per stroke needs placement at skilled nursing facility that is trach capable-medically stable for discharge noting trach has been in place now for 30 days  Barriers to discharge: Social: Current insurance coverage about to expire and he otherwise has no funding for long-term care.  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  PMT held extensive conversation with family regarding goals of care on 1/8.  Wife updated regarding discharge planning per [facility] team on 1/10. DVT prophylaxis: SCDs COVID vaccination status: Unknown  Foley catheter: 50 French inserted on 12/17; changed on 12/24 POA: No  HPI: 56 y.o. male with PMH significant for DM2, HTN, CKD with questionable compliance to medications Patient presented to the ED on 12/30/2020 with complaint of headache, altered mental status   In the ED, he was hypertensive to 238/160, agitated, required restraints CT head showed large amount of intraventricular hemorrhage involving lateral/third/fourth ventricle, ICH and left basal ganglia as well as chronic microvascular ischemic changes. He was started on Cleviprex drip and admitted to neuro ICU   He subsequently had CT head repeated on 11/30, 12/1 and 12/3, all demonstrated unchanged intraparenchymal hemorrhage and intraventricular hemorrhage 12/3, MRI brain from also showed numerous small acute infarcts in both cerebral hemisphere with minimal improvement  in the right superior cerebellum 12/3, intubated 12/13, tracheostomy 12/15, wean from vent to trach collar 12/16, PEG tube placement 12/16, repeat MRI showed an increase in the size and number of acute infarcts in the bilateral hemispheres with increased involvement in the cerebellum bilaterally.   12/18, transferred from neuro service to [facility]  12/21, palliative care consulted.  Family wanted to continue aggressive care as full CODE STATUS. 12/23 developed Cheyne-Stokes respiratory pattern with respiratory alkalosis.  Transferred to ICU for short-term mechanical ventilation and was found to have new tracheobronchitis now on cefepime 12/25 back to progressive floor and to [facility]   His hospital course has been complicated by significant neurological impairment, aspiration events, AKI, hypernatremia, urinary retention. 1/9 trach changed to #6.0 Shiley cuffless--1/10 > 30 days since trach inserted.  Subjective: Unresponsive today.  Objective: Vitals:   02/11/21 0429 02/11/21 0806  BP: 133/85   Pulse: 89   Resp: (!) 21 12  Temp:  99.1 F (37.3 C)  SpO2: 98% 100%    Intake/Output Summary (Last 24 hours) at 02/11/2021 0810 Last data filed at 02/11/2021 0357 Gross per 24 hour  Intake 2000 ml  Output 1800 ml  Net 200 ml   Filed Weights   02/06/21 0500 02/07/21 0500 02/09/21 0500  Weight: 80.1 kg 80.2 kg 80.1 kg    Exam:  Constitutional: Unresponsive, NAD Respiratory: #6.0 Shiley cuffless trach,  Lung sounds more coarse but clear without rhonchi today, trach dressing clean and dry without sputum noted Cardiovascular: S1-S2, NSR, normotensive, no bilateral lower extremity edema  Abdomen:  Abdomen soft and nontender.  PEG tube -normoactive bowels -rectal Foley in place with liquid brown drainage Genitourinary: Foley catheter in place  draining clear yellow urine to bedside bag-clamping trial in progress Neurologic: Unable to accurately test cranial nerves.  Eyes closed.  No blink reflex  when tested.  Appears to have a more normal respiratory pattern today.  Upper extremities are flaccid to PROM.  Bilateral lower extremities less hypertonic with improved flexion with PROM bilaterally. Psychiatric: Unresponsive   Assessment/Plan: Acute problems: Brain injury 2/2 acute intraparenchymal and intraventricular hemorrhage/Bilateral multifocal ischemic infarcts  -Follow-up MRI on 12/16 demonstrated increase in size and number of acute infarcts  -Neurology signed off -currently not on blood thinners because of intracranial hemorrhage. -Has intermittent episodes of [place] and Cheyne-Stokes respiratory patterns alternating with normal respiratory pattern -Continue baclofen for hypertonicity and low-dose scheduled Oxy IR for suspected underlying pain  - continue Symmetrel and Zyprexa -recent EEG demonstrates persistent encephalopathy but no seizures  EOL discussion -per PMT 1/8 family requests that current level of care be continued including full CODE STATUS  Acute respiratory failure with hypoxia 2/2 recurrent Serratia tracheobronchitis (recent MSSA and serratia pneumonia; recent Klebsiella tracheobronchitis); -Completed Zosyn on 12/21 -12/23 recurrent acute tracheobronchitis-completed 7 days of Cefepime as rec by PCCM based on cxs -Continue scopolamine for excessive oral secretions to help minimize risk for aspiration  Stage II perirectal pressure wound (resolved) -Consider discontinuation of rectal tube once volume of liquid stool has decreased -Scheduled MiraLAX recently changed to prn any significant change in stool consistency -Continue Neutrisource fiber pack  Bilateral lower extremity edema -Diuresed 7850 cc UOP after one-time dose of 40 mg IV Lasix -BUN 44 with creatinine 1.16 which is stable  Essential hypertension/EKG with LVH strain -Blood pressure on admission was elevated to over 834 systolic. -Continue max dose Coreg, hydralazine and clonidine -BP now  controlled   AKI on CKD 3b -Baseline renal function: January 2022 BUN was 18 and creatinine 1.49 -Current creatinine 1.18 with a BUN of 37   Hypernatremia Resolved  Non obstructive transaminitis -Recurrent issue throughout hospitalization -CT abdomen and pelvis 12/23 unremarkable no evidence of cirrhosis or other biliary tree issues -LFTs improved with mild elevation in ALT of 49 as of 1/9 -Hepatitis panel unremarkable  Type 2 diabetes mellitus -A1c 6.3 in 12/31/2020 -Continue Levemir 20 units BID along with SSI every 4 hours -CBGs well controlled   Protein calorie malnutrition/dysphagia Nutrition Problem: Inadequate oral intake Etiology: lethargy/confusion, dysphagia Signs/Symptoms: NPO status Interventions: Tube feeding via PEG Body mass index is 23.95 kg/m.    Acute urinary retention  -Continue Urecholine -Currently has Foley catheter.  Last exchanged on 12/23.   - recently on clamping trial.  As of 1/11 we will discontinue Foley check bladder scans every shift      Data Reviewed: Basic Metabolic Panel: Recent Labs  Lab 02/06/21 0056 02/09/21 0434  NA 139 141  K 4.0 4.3  CL 108 106  CO2 26 27  GLUCOSE 136* 138*  BUN 52* 45*  CREATININE 1.05 1.00  CALCIUM 8.8* 9.5    CBC: Recent Labs  Lab 02/09/21 0434  WBC 6.3  HGB 10.9*  HCT 34.6*  MCV 76.5*  PLT 206     CBG: Recent Labs  Lab 02/10/21 1719 02/10/21 2034 02/11/21 0011 02/11/21 0355 02/11/21 0802  GLUCAP 159* 114* 158* 141* 134*    No results found for this or any previous visit (from the past 240 hour(s)).    Studies: No results found.  Scheduled Meds:  amantadine  100 mg Per Tube BID   baclofen  5 mg Per Tube TID AC & HS  bethanechol  25 mg Per Tube TID   carvedilol  25 mg Per Tube BID WC   chlorhexidine gluconate (MEDLINE KIT)  15 mL Mouth Rinse BID   Chlorhexidine Gluconate Cloth  6 each Topical Q0600   cloNIDine  0.1 mg Per Tube Q8H   fiber  1 packet Per Tube BID   free  water  100 mL Per Tube Q6H   heparin injection (subcutaneous)  5,000 Units Subcutaneous Q8H   hydrALAZINE  50 mg Per Tube Q8H   insulin aspart  0-20 Units Subcutaneous Q4H   insulin detemir  20 Units Subcutaneous BID   mouth rinse  15 mL Mouth Rinse 10 times per day   nutrition supplement (JUVEN)  1 packet Per Tube BID BM   OLANZapine  2.5 mg Per Tube Daily   OLANZapine  5 mg Per Tube QHS   oxyCODONE  2.5 mg Per Tube Q8H   pantoprazole sodium  40 mg Per Tube Daily   scopolamine  1 patch Transdermal Q72H   Continuous Infusions:  feeding supplement (GLUCERNA 1.5 CAL) 1,000 mL (02/10/21 2218)    Principal Problem:   ICH (intracerebral hemorrhage) ([place]) Active Problems:   Malignant hypertension   Diabetes mellitus type 2 in obese ([place])   Acute respiratory failure ([place])   Tracheostomy dependence ([place])   Pneumonia due to Serratia marcescens ([place])   MSSA (methicillin susceptible Staphylococcus aureus) pneumonia ([place])   Stage 3b chronic kidney disease (CKD) ([place])   Acute kidney injury ([place])   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)   Swollen lip   Fever   Muscular hypertonicity   Consultants: Neurology PCCM Medicine  Procedures: Echocardiogram EEG Cortrack PEG tube placement Tracheostomy tube placement  Antibiotics: Zosyn 12/15 through 12/21 Cefepime 12/24 through 12/27 Bactrim 12/27 >>   Time spent: 45 minutes    Erin Hearing ANP  [facility] 7 am - 330 pm/M-F for direct patient care and secure chat Please refer to Amion for contact info 43  days

## 2021-02-11 NOTE — Progress Notes (Signed)
MEWS score yellow (3) based off of mental status and RR. No change from prior assessments. PT with known past hemorraghic stroke with persistent decreased mental status driving MEWS score. Will Medical illustrator per policy.    02/11/21 0812  Assess: MEWS Score  Temp 99.1 F (37.3 C)  BP (!) 134/96  Pulse Rate 77  ECG Heart Rate 78  Resp 13  SpO2 100 %  Assess: MEWS Score  MEWS Temp 0  MEWS Systolic 0  MEWS Pulse 0  MEWS RR 1  MEWS LOC 2  MEWS Score 3  MEWS Score Color Yellow  Assess: if the MEWS score is Yellow or Red  Were vital signs taken at a resting state? Yes  Focused Assessment No change from prior assessment  Early Detection of Sepsis Score *See Row Information* Low  Take Vital Signs  Increase Vital Sign Frequency  Yellow: Q 2hr X 2 then Q 4hr X 2, if remains yellow, continue Q 4hrs

## 2021-02-11 NOTE — Progress Notes (Signed)
Occupational Therapy Treatment Patient Details Name: Stephen Delgado MRN: [id number] DOB: [birth year] Today's Date: 02/11/2021   History of present illness 56 y/o presented to [facility] ED on 11/29 for AMS, lethargy, and vomiting. Transferred to [facility]. CTH showed L caudate/BG hemorrhage with IVH. Declining respiratory status with snoring respirations. Intubated 12/3. Trach placed 12/13.on 12/16, repeat MRI showed an increase in the size and number of acute infarcts in the bilateral hemispheres with increased involvement in the cerebellum bilaterally. On 12/23, pt transferred to ICU for worsening respiratory status due to tracheobronchitis, requiring short term mechanical ventilation. PMH: HTN, sleep apnea, DM, CKD 3   OT comments  Patient seen by skilled OT to address BUE ROM with PROM and edema management. Patient did not make any responses to any type of stimuli or during ROM. Acute OT to continue to follow to address participation and following commands.    Recommendations for follow up therapy are one component of a multi-disciplinary discharge planning process, led by the attending physician.  Recommendations may be updated based on patient status, additional functional criteria and insurance authorization.    Follow Up Recommendations  Skilled nursing-short term rehab (<3 hours/day)    Assistance Recommended at Discharge Frequent or constant Supervision/Assistance  Patient can return home with the following  Two people to help with walking and/or transfers;Two people to help with bathing/dressing/bathroom;Assistance with feeding;Assistance with cooking/housework;Direct supervision/assist for medications management;Direct supervision/assist for financial management;Assist for transportation;Help with stairs or ramp for entrance;Other (comment) (dependent in all levels of care)   Equipment Recommendations  Other (comment) (TBD)    Recommendations for Other Services      Precautions / Restrictions  Precautions Precautions: Fall Precaution Comments: G-tube, trach collar, flexiseal, foley, BUE/pedal edema       Mobility Bed Mobility                    Transfers                         Balance                                           ADL either performed or assessed with clinical judgement   ADL                                              Extremity/Trunk Assessment              Vision       Perception     Praxis      Cognition Arousal/Alertness: Lethargic Behavior During Therapy: Flat affect Overall Cognitive Status: Difficult to assess                                 General Comments: patient did not respond to any stimuli during session          Exercises Exercises: Other exercises;General Upper Extremity General Exercises - Upper Extremity Shoulder Flexion: PROM;Both;10 reps;Supine Shoulder Extension: PROM;10 reps;Both;Supine Shoulder ABduction: PROM;Both;10 reps;Supine Shoulder ADduction: PROM;Both;10 reps;Supine Elbow Flexion: PROM;Both;10 reps;Supine Elbow Extension: PROM;Both;10 reps;Supine Wrist Flexion: PROM;Both;10 reps;Supine Wrist Extension: PROM;Both;10 reps;Supine Digit Composite Flexion: PROM;Both;10 reps;Supine Other Exercises Other Exercises:  retro grade message to BUE Other Exercises: BUE positioned to address edema   Shoulder Instructions       General Comments      Pertinent Vitals/ Pain       Pain Assessment: Faces Faces Pain Scale: No hurt  Home Living                                          Prior Functioning/Environment              Frequency  Min 1X/week        Progress Toward Goals  OT Goals(current goals can now be found in the care plan section)  Progress towards OT goals: Not progressing toward goals - comment (due to level of arousal patient has not been able to follow commands)  Acute Rehab OT Goals OT  Goal Formulation: Patient unable to participate in goal setting Time For Goal Achievement: 02/18/21 Potential to Achieve Goals: Poor ADL Goals Additional ADL Goal #1: Pt will follow 1 step commands 1/4 times as precursor for ADL tasks  Plan Discharge plan remains appropriate    Co-evaluation                 AM-PAC OT "6 Clicks" Daily Activity     Outcome Measure   Help from another person eating meals?: Total Help from another person taking care of personal grooming?: Total Help from another person toileting, which includes using toliet, bedpan, or urinal?: Total Help from another person bathing (including washing, rinsing, drying)?: Total Help from another person to put on and taking off regular upper body clothing?: Total Help from another person to put on and taking off regular lower body clothing?: Total 6 Click Score: 6    End of Session Equipment Utilized During Treatment: Oxygen  OT Visit Diagnosis: Other abnormalities of gait and mobility (R26.89);Muscle weakness (generalized) (M62.81);Low vision, both eyes (H54.2);Other symptoms and signs involving the nervous system (R29.898);Other symptoms and signs involving cognitive function;Cognitive communication deficit (R41.841) Symptoms and signs involving cognitive functions: Nontraumatic SAH;Other Nontraumatic ICH   Activity Tolerance Patient limited by lethargy   Patient Left in bed;with call bell/phone within reach;with bed alarm set   Nurse Communication Other (comment) (level of arousal, positioning of BUEs)        Time: 1300-1314 OT Time Calculation (min): 14 min  Charges: OT General Charges $OT Visit: 1 Visit OT Treatments $Therapeutic Activity: 8-22 mins  Lodema Hong, [place]  Pager [phone] Office [place] 02/11/2021, 3:23 PM

## 2021-02-11 NOTE — TOC Progression Note (Signed)
Transition of Care [facility]) - Progression Note    Patient Details  Name: Stephen Delgado MRN: [id number] Date of Birth: 08-Jul-1965  Transition of [facility]) CM/SW Contact  [name], RN Phone Number: 02/11/2021, 11:03 AM  Clinical Narrative:    CM faxed the patient out in the hub to multiple SNF facilities in the area for review for placement.  I called and left a voicemail message with [facility] and [facility] at this time for request to review the patient's clinicals for needed placement.  [name] has not added the patient to her [facility] insurance policy at this time.  [name] is currently working with Financial counseling to provide financial documents to screen the patient for needed disability and Medicaid determination.  I left voicemail message with Gaetana Michaelis, CM at [facility] in [place], [place].  The facility has bed availability at this time for complex care needs.  The facility will review the patient's clinicals emailed yesterday and will made determination as to available bed offer.  If accepted, the family will need to apply to [place].  The patient has no current bed offers for placement at this time.  The patient is being followed by the [facility] Team at this time since the patient has complex care needs, lacks insurance provider and family is requesting SNF placement for care.   Expected Discharge Plan: Skilled Nursing Facility Barriers to Discharge: Continued Medical Work up  Expected Discharge Plan and Services Expected Discharge Plan: Skilled Nursing Facility   Discharge Planning Services: CM Consult Post Acute Care Choice: Skilled Nursing Facility Living arrangements for the past 2 months: Single Family Home                                       Social Determinants of Health (SDOH) Interventions    Readmission Risk Interventions No flowsheet data found.

## 2021-02-12 ENCOUNTER — Inpatient Hospital Stay (HOSPITAL_COMMUNITY): Payer: 59

## 2021-02-12 LAB — GLUCOSE, CAPILLARY
Glucose-Capillary: 133 mg/dL — ABNORMAL HIGH (ref 70–99)
Glucose-Capillary: 137 mg/dL — ABNORMAL HIGH (ref 70–99)
Glucose-Capillary: 176 mg/dL — ABNORMAL HIGH (ref 70–99)
Glucose-Capillary: 177 mg/dL — ABNORMAL HIGH (ref 70–99)
Glucose-Capillary: 179 mg/dL — ABNORMAL HIGH (ref 70–99)
Glucose-Capillary: 182 mg/dL — ABNORMAL HIGH (ref 70–99)

## 2021-02-12 MED ORDER — AMLODIPINE BESYLATE 2.5 MG PO TABS
2.5000 mg | ORAL_TABLET | Freq: Every day | ORAL | Status: DC
  Administered 2021-02-12 – 2021-03-10 (×27): 2.5 mg
  Filled 2021-02-12 (×27): qty 1

## 2021-02-12 MED ORDER — CLONIDINE HCL 0.1 MG PO TABS
0.2000 mg | ORAL_TABLET | Freq: Two times a day (BID) | ORAL | Status: DC
  Administered 2021-02-12 – 2021-03-21 (×74): 0.2 mg
  Filled 2021-02-12 (×74): qty 2

## 2021-02-12 MED ORDER — BACLOFEN 10 MG PO TABS
10.0000 mg | ORAL_TABLET | Freq: Three times a day (TID) | ORAL | Status: DC
  Administered 2021-02-12 – 2021-04-30 (×306): 10 mg
  Filled 2021-02-12 (×306): qty 1

## 2021-02-12 NOTE — Progress Notes (Signed)
[facility] PROGRESS NOTE  Stephen Delgado PXT:[id number] DOB: 1965/12/09 DOA: 12/30/2020 PCP: Patient, [name]   01/29/2021    01/29/2021                 02/04/2020  Status: Remains inpatient appropriate because:  Unsafe discharge plan.  Patient in persistent vegetative state per stroke needs placement at skilled nursing facility that is trach capable-medically stable for discharge noting trach has been in place now for 30 days  Barriers to discharge: Social: Current insurance coverage about to expire and he otherwise has no funding for long-term care.  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  PMT held extensive conversation with family regarding goals of care on 1/8.  Wife updated regarding discharge planning per [facility] team on 1/10. DVT prophylaxis: SCDs COVID vaccination status: Unknown  Foley catheter: 49 French inserted on 12/17; changed on 12/24 POA: No  HPI: 56 y.o. male with PMH significant for DM2, HTN, CKD with questionable compliance to medications Patient presented to the ED on 12/30/2020 with complaint of headache, altered mental status   In the ED, he was hypertensive to 238/160, agitated, required restraints CT head showed large amount of intraventricular hemorrhage involving lateral/third/fourth ventricle, ICH and left basal ganglia as well as chronic microvascular ischemic changes. He was started on Cleviprex drip and admitted to neuro ICU   He subsequently had CT head repeated on 11/30, 12/1 and 12/3, all demonstrated unchanged intraparenchymal hemorrhage and intraventricular hemorrhage 12/3, MRI brain from also showed numerous small acute infarcts in both cerebral hemisphere with minimal improvement  in the right superior cerebellum 12/3, intubated 12/13, tracheostomy 12/15, wean from vent to trach collar 12/16, PEG tube placement 12/16, repeat MRI showed an increase in the size and number of acute infarcts in the bilateral hemispheres with increased involvement in the cerebellum bilaterally.   12/18, transferred from neuro service to [facility]  12/21, palliative care consulted.  Family wanted to continue aggressive care as full CODE STATUS. 12/23 developed Cheyne-Stokes respiratory pattern with respiratory alkalosis.  Transferred to ICU for short-term mechanical ventilation and was found to have new tracheobronchitis now on cefepime 12/25 back to progressive floor and to [facility]   His hospital course has been complicated by significant neurological impairment, aspiration events, AKI, hypernatremia, urinary retention. 1/9 trach changed to #6.0 Shiley cuffless--1/10 > 30 days since trach inserted.  Subjective: Upon my entry into the room patient's eyes were open and he was focused and tracking somewhat on the nurse at the bedside working with him.  She states he has been doing this intermittently this morning during her encounters.  Objective: Vitals:   02/12/21 0346 02/12/21 0735  BP: (!) 142/76 (!) 153/112  Pulse: 98 (!) 108  Resp: 14 16  Temp: 98.6 F (37 C) 98.6 F (37 C)  SpO2: 97% 94%    Intake/Output Summary (Last 24 hours) at 02/12/2021 0745 Last data filed at 02/12/2021 0100 Gross per 24 hour  Intake --  Output 1200 ml  Net -1200 ml   Filed Weights   02/06/21 0500 02/07/21 0500 02/09/21 0500  Weight: 80.1 kg 80.2 kg 80.1 kg    Exam:  Constitutional: Eyes open- NAD Respiratory: #6.0 Shiley cuffless trach,  Lung sounds more coarse but clear without rhonchi  today, trach dressing with thick tan secretions noted.  Tachypnea Cardiovascular: S1-S2, NSR, hypertensive and tachycardic in context of being more awake, no bilateral lower extremity edema  Abdomen:  Abdomen soft and  nontender.  PEG tube -normoactive bowels -rectal Foley in place with liquid brown drainage Genitourinary: Foley catheter is continue to 1/11.  Patient voiding large volumes of yellow urine Neurologic: Unable to accurately test cranial nerves.  Eyes open and he appears to be tracking RN at bedside.  Appears to have a more normal respiratory pattern today other than tachypnea.  Upper extremities are flaccid to PROM.  Bilateral lower extremities less hypertonic with improved flexion with PROM bilaterally. Psychiatric: Eyes are open but does not make any attempt to communicate or verbally responsive although does appear to be tracking nurse at bedside   Assessment/Plan: Acute problems: Brain injury 2/2 acute intraparenchymal and intraventricular hemorrhage/Bilateral multifocal ischemic infarcts  -Follow-up MRI on 12/16 demonstrated increase in size and number of acute infarcts  -Neurology signed off -currently not on blood thinners because of intracranial hemorrhage. -Has intermittent episodes of K[place]malls and Cheyne-Stokes respiratory patterns alternating with normal respiratory pattern -1/12 Increase baclofen to 10 mg TID for hypertonicity; continue low-dose scheduled Oxy IR for suspected underlying pain  - continue Symmetrel and Zyprexa -recent EEG demonstrates persistent encephalopathy but no seizures  EOL discussion -per PMT 1/8 family requests that current level of care be continued including full CODE STATUS  Acute respiratory failure with hypoxia 2/2 recurrent Serratia tracheobronchitis (recent MSSA and serratia pneumonia; recent Klebsiella tracheobronchitis); -Completed Zosyn on 12/21 -12/23 recurrent acute tracheobronchitis-completed 7 days of Cefepime as rec by PCCM based on cxs -Continue scopolamine for excessive oral secretions to help minimize risk for aspiration -Tachypneic but likely related to anxiety but given continued secretions will check portable chest x-ray today on  1/12  Stage II perirectal pressure wound (resolved) -Consider discontinuation of rectal tube once volume of liquid stool has decreased -Scheduled MiraLAX recently changed to prn any significant change in stool consistency -Continue Neutrisource fiber pack  Bilateral lower extremity edema -Diuresed 7850 cc UOP after one-time dose of 40 mg IV Lasix -BUN 44 with creatinine 1.16 which is stable  Essential hypertension/EKG with LVH strain -Blood pressure on admission was elevated to over 283 systolic. -Continue max dose Coreg, hydralazine and clonidine -BP poorly controlled in the evenings and overnight so will adjust clonidine from 0.1 mg TID to 0.2 mg BID and add Norvasc 2.5 mg at HS -could be from hypertonicity and ? Awakening? so will increase Baclofen   AKI on CKD 3b -Baseline renal function: January 2022 BUN was 18 and creatinine 1.49 -Current creatinine 1.18 with a BUN of 37   Hypernatremia Resolved  Non obstructive transaminitis -Recurrent issue throughout hospitalization -CT abdomen and pelvis 12/23 unremarkable no evidence of cirrhosis or other biliary tree issues -LFTs improved with mild elevation in ALT of 49 as of 1/9 -Hepatitis panel unremarkable  Type 2 diabetes mellitus -A1c 6.3 in 12/31/2020 -Continue Levemir 20 units BID along with SSI every 4 hours -CBGs well controlled   Protein calorie malnutrition/dysphagia Nutrition Problem: Inadequate oral intake Etiology: lethargy/confusion, dysphagia Signs/Symptoms: NPO status Interventions: Tube feeding via PEG Body mass index is 23.95 kg/m.    Acute urinary retention  -Continue Urecholine -Foley catheter discontinued on 1/11 and patient voiding easily      Data Reviewed: Basic Metabolic Panel: Recent Labs  Lab 02/06/21 0056 02/09/21 0434  NA 139 141  K 4.0 4.3  CL 108 106  CO2 26 27  GLUCOSE 136* 138*  BUN 52* 45*  CREATININE 1.05 1.00  CALCIUM 8.8* 9.5    CBC: Recent Labs  Lab 02/09/21 0434   WBC 6.3  HGB 10.9*  HCT 34.6*  MCV 76.5*  PLT 206     CBG: Recent Labs  Lab 02/11/21 1159 02/11/21 1549 02/11/21 2044 02/12/21 0007 02/12/21 0338  GLUCAP 185* 177* 137* 177* 176*      Scheduled Meds:  amantadine  100 mg Per Tube BID   amLODipine  2.5 mg Oral QHS   baclofen  10 mg Per Tube TID AC & HS   bethanechol  25 mg Per Tube TID   carvedilol  25 mg Per Tube BID WC   chlorhexidine gluconate (MEDLINE KIT)  15 mL Mouth Rinse BID   Chlorhexidine Gluconate Cloth  6 each Topical Q0600   cloNIDine  0.2 mg Per Tube BID   fiber  1 packet Per Tube BID   free water  100 mL Per Tube Q6H   heparin injection (subcutaneous)  5,000 Units Subcutaneous Q8H   hydrALAZINE  50 mg Per Tube Q8H   insulin aspart  0-20 Units Subcutaneous Q4H   insulin detemir  20 Units Subcutaneous BID   mouth rinse  15 mL Mouth Rinse 10 times per day   nutrition supplement (JUVEN)  1 packet Per Tube BID BM   OLANZapine  2.5 mg Per Tube Daily   OLANZapine  5 mg Per Tube QHS   oxyCODONE  2.5 mg Per Tube Q8H   pantoprazole sodium  40 mg Per Tube Daily   scopolamine  1 patch Transdermal Q72H   Continuous Infusions:  feeding supplement (GLUCERNA 1.5 CAL) 1,000 mL (02/11/21 1838)    Principal Problem:   ICH (intracerebral hemorrhage) (HCC) Active Problems:   Malignant hypertension   Diabetes mellitus type 2 in obese (HCC)   Acute respiratory failure (HCC)   Tracheostomy dependence ([place])   Pneumonia due to Serratia marcescens (HCC)   MSSA (methicillin susceptible Staphylococcus aureus) pneumonia ([place])   Stage 3b chronic kidney disease (CKD) ([place])   Acute kidney injury ([place])   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 (HCC)   Acute hypernatremia   Dysphagia due to recent stroke   Acute urinary retention   Respiration abnormal   Pressure injury of skin   Abdominal distention   Intractable hiccups   Pneumonia due to Klebsiella pneumoniae (HCC)   Swollen lip    Fever   Muscular hypertonicity   Consultants: Neurology PCCM Medicine  Procedures: Echocardiogram EEG Cortrack PEG tube placement Tracheostomy tube placement  Antibiotics: Zosyn 12/15 through 12/21 Cefepime 12/24 through 12/27 Bactrim 12/27 >> 12/29 Cefepime 12/31 through 1/5   Time spent: 25 minutes    Erin Hearing ANP  [facility] 7 am - 330 pm/M-F for direct patient care and secure chat Please refer to Amion for contact info 44  days

## 2021-02-13 LAB — GLUCOSE, CAPILLARY
Glucose-Capillary: 153 mg/dL — ABNORMAL HIGH (ref 70–99)
Glucose-Capillary: 155 mg/dL — ABNORMAL HIGH (ref 70–99)
Glucose-Capillary: 156 mg/dL — ABNORMAL HIGH (ref 70–99)
Glucose-Capillary: 165 mg/dL — ABNORMAL HIGH (ref 70–99)
Glucose-Capillary: 170 mg/dL — ABNORMAL HIGH (ref 70–99)
Glucose-Capillary: 173 mg/dL — ABNORMAL HIGH (ref 70–99)

## 2021-02-13 MED ORDER — HYDRALAZINE HCL 50 MG PO TABS
100.0000 mg | ORAL_TABLET | Freq: Three times a day (TID) | ORAL | Status: DC
  Administered 2021-02-13 – 2021-04-30 (×228): 100 mg
  Filled 2021-02-13 (×228): qty 2

## 2021-02-13 NOTE — TOC Progression Note (Addendum)
Transition of Care [facility]) - Progression Note    Patient Details  Name: Stephen Delgado MRN: [id number] Date of Birth: 30-Jul-1965  Transition [facility]) CM/SW Contact  [name], RN Phone Number: 02/13/2021, 7:48 AM  Clinical Narrative:    CM spoke with the patient's son, Jodeci Roarty, on the phone and he plans to forward a copy of the patient's updated insurance coverage information. I asked that the son please provide [place] admitting with the updated Insurance provider.  I forwarded the contact number for admitting to the patient's son to call and update [place] admitting.  CM and MSW with DTP Team will continue to follow the patient for SNF placement.  02/13/2021 1415 - I sent the patient's updated Insurance information to Domingo Madeira, CM at [facility] facility so that she is aware that the patient has updated insurance provider for possible bed offer.  I also left a message with Lester Merrifield, CM at [facility] and asked that she review the patient's clinicals placed int he hub and that Admitting has not updated patient's available active insurance yet.   Expected Discharge Plan: Skilled Nursing Facility Barriers to Discharge: Continued Medical Work up  Expected Discharge Plan and Services Expected Discharge Plan: Skilled Nursing Facility   Discharge Planning Services: CM Consult Post Acute Care Choice: Skilled Nursing Facility Living arrangements for the past 2 months: Single Family Home                                       Social Determinants of Health (SDOH) Interventions    Readmission Risk Interventions No flowsheet data found.

## 2021-02-13 NOTE — Progress Notes (Signed)
[facility] PROGRESS NOTE  Stephen Delgado JOA:[id number] DOB: [birth year] DOA: 12/30/2020 PCP: Patient, [name]   01/29/2021    01/29/2021                 02/04/2020  Status: Remains inpatient appropriate because:  Unsafe discharge plan.  Patient in persistent vegetative state per stroke needs placement at skilled nursing facility that is trach capable-medically stable for discharge noting trach has been in place now for 30 days  Barriers to discharge: Social: Current insurance coverage about to expire and he otherwise has no funding for long-term care.  Financial services clarifying regarding eligibility for disability and Medicaid  Clinical: Tracheostomy and PEG dependent with recurrent tracheobronchitis; remains a full code  Level of care:  Med-Surg    Code Status: Full code Family Communication: 1/13 called son [name].  HIPAA appropriate voicemail left DVT prophylaxis: SCDs COVID vaccination status: Unknown  Foley catheter: 61 French inserted on 12/17; changed on 12/24 POA: No  HPI: 56 y.o. male with PMH significant for DM2, HTN, CKD with questionable compliance to medications Patient presented to the ED on 12/30/2020 with complaint of headache, altered mental status   In the ED, he was hypertensive to 238/160, agitated, required restraints CT head showed large amount of intraventricular hemorrhage involving lateral/third/fourth ventricle, ICH and left basal ganglia as well as chronic microvascular ischemic changes. He was started on Cleviprex drip and admitted to neuro ICU   He subsequently had CT head repeated on 11/30, 12/1 and 12/3, all demonstrated unchanged intraparenchymal hemorrhage and intraventricular hemorrhage 12/3, MRI brain from also showed numerous small acute infarcts in both cerebral hemisphere with minimal improvement in the right superior cerebellum 12/3, intubated 12/13, tracheostomy 12/15, wean from vent to trach collar 12/16,  PEG tube placement 12/16, repeat MRI showed an increase in the size and number of acute infarcts in the bilateral hemispheres with increased involvement in the cerebellum bilaterally.   12/18, transferred from neuro service to [facility]  12/21, palliative care consulted.  Family wanted to continue aggressive care as full CODE STATUS. 12/23 developed Cheyne-Stokes respiratory pattern with respiratory alkalosis.  Transferred to ICU for short-term mechanical ventilation and was found to have new tracheobronchitis now on cefepime 12/25 back to progressive floor and to [facility]   His hospital course has been complicated by significant neurological impairment, aspiration events, AKI, hypernatremia, urinary retention. 1/9 trach changed to #6.0 Shiley cuffless--1/10 > 30 days since trach inserted.  Subjective: Encountered patient with eyes closed.  Said his name and he opened his eyes and kept his eyes open.  Was not tracking.  When I asked him to blink once per yes and twice for know if he understood what I was telling him he blinked once.  Objective: Vitals:   02/13/21 0308 02/13/21 0324  BP:  (!) 144/101  Pulse:  70  Resp: 14 14  Temp:  98.4 F (36.9 C)  SpO2:  100%    Intake/Output Summary (Last 24 hours) at 02/13/2021 0741 Last data filed at 02/13/2021 0429 Gross per 24 hour  Intake 1190 ml  Output 2600 ml  Net -1410 ml   Filed Weights   02/06/21 0500 02/07/21 0500 02/09/21 0500  Weight: 80.1 kg 80.2 kg 80.1 kg    Exam:  Constitutional: Eyes open- NAD Respiratory: #6.0 Shiley cuffless trach,  Lung sounds more coarse but clear without rhonchi today, trach dressing with thick tan secretions noted.  Tachypnea Cardiovascular: S1-S2, NSR, essentially normotensive and previous tachycardia  has resolved, no bilateral lower extremity edema  Abdomen:  Abdomen soft and nontender.  PEG tube -normoactive bowels -rectal Foley in place with liquid brown drainage Neurologic: Unable to accurately test  cranial nerves.  Eyes open and he appears to be tracking RN at bedside.  Appears to have a more normal respiratory pattern today other than tachypnea.  Upper extremities are flaccid to PROM.  Bilateral lower extremities less hypertonic with improved flexion with PROM bilaterally.  Diet is having a Cheyne-Stokes respiratory pattern with apnea duration between 12 and 13 seconds Psychiatric: Opens eyes to voice.  Recognized his own name.  Was able to follow 1 simple command and question answer by blinking.   Assessment/Plan: Acute problems: Brain injury 2/2 acute intraparenchymal and intraventricular hemorrhage/Bilateral multifocal ischemic infarcts  -Follow-up MRI on 12/16 demonstrated increase in size and number of acute infarcts  -Neurology signed off -currently not on blood thinners because of intracranial hemorrhage. -Has intermittent episodes of [place] and Cheyne-Stokes respiratory patterns alternating with normal respiratory pattern -1/12 Increased baclofen to 10 mg TID for hypertonicity; continue low-dose scheduled Oxy IR for suspected underlying pain  - continue Symmetrel and Zyprexa -recent EEG demonstrates persistent encephalopathy but no seizures  EOL discussion -per PMT 1/8 family requests that current level of care be continued including full CODE STATUS  Acute respiratory failure with hypoxia 2/2 recurrent Serratia tracheobronchitis (recent MSSA and serratia pneumonia; recent Klebsiella tracheobronchitis); -Completed Zosyn on 12/21 -12/23 recurrent acute tracheobronchitis-completed 7 days of Cefepime as rec by PCCM based on cxs -Continue scopolamine for excessive oral secretions to help minimize risk for aspiration -Tachypneic but likely related to anxiety but given continued secretions will check portable chest x-ray today on 1/12  Stage II perirectal pressure wound (resolved) -Consider discontinuation of rectal tube once volume of liquid stool has decreased -Scheduled MiraLAX  recently changed to prn any significant change in stool consistency -Continue Neutrisource fiber pack  Bilateral lower extremity edema -Diuresed 7850 cc UOP after one-time dose of 40 mg IV Lasix -BUN 44 with creatinine 1.16 which is stable  Essential hypertension/EKG with LVH strain -Blood pressure on admission was elevated to over 893 systolic. -Continue max dose Coreg, hydralazine and clonidine -BP poorly controlled in the evenings and overnight so will adjust clonidine from 0.1 mg TID to 0.2 mg BID and add Norvasc 2.5 mg at HS -could be from hypertonicity and ? Awakening? so will increase Baclofen   AKI on CKD 3b -Baseline renal function: January 2022 BUN was 18 and creatinine 1.49 -Current creatinine 1.18 with a BUN of 37   Hypernatremia Resolved  Non obstructive transaminitis -Recurrent issue throughout hospitalization -CT abdomen and pelvis 12/23 unremarkable no evidence of cirrhosis or other biliary tree issues -LFTs improved with mild elevation in ALT of 49 as of 1/9 -Hepatitis panel unremarkable  Type 2 diabetes mellitus -A1c 6.3 in 12/31/2020 -Continue Levemir 20 units BID along with SSI every 4 hours -CBGs well controlled   Protein calorie malnutrition/dysphagia Nutrition Problem: Inadequate oral intake Etiology: lethargy/confusion, dysphagia Signs/Symptoms: NPO status Interventions: Tube feeding via PEG Body mass index is 23.95 kg/m.    Acute urinary retention  -Continue Urecholine -Foley catheter discontinued on 1/11 and patient voiding easily      Data Reviewed: Basic Metabolic Panel: Recent Labs  Lab 02/09/21 0434  NA 141  K 4.3  CL 106  CO2 27  GLUCOSE 138*  BUN 45*  CREATININE 1.00  CALCIUM 9.5    CBC: Recent Labs  Lab 02/09/21 0434  WBC 6.3  HGB 10.9*  HCT 34.6*  MCV 76.5*  PLT 206     CBG: Recent Labs  Lab 02/12/21 1131 02/12/21 1547 02/12/21 2043 02/13/21 0047 02/13/21 0427  GLUCAP 182* 137* 133* 165* 156*       Scheduled Meds:  amantadine  100 mg Per Tube BID   amLODipine  2.5 mg Per Tube QHS   baclofen  10 mg Per Tube TID AC & HS   bethanechol  25 mg Per Tube TID   carvedilol  25 mg Per Tube BID WC   chlorhexidine gluconate (MEDLINE KIT)  15 mL Mouth Rinse BID   Chlorhexidine Gluconate Cloth  6 each Topical Q0600   cloNIDine  0.2 mg Per Tube BID   fiber  1 packet Per Tube BID   free water  100 mL Per Tube Q6H   heparin injection (subcutaneous)  5,000 Units Subcutaneous Q8H   hydrALAZINE  100 mg Per Tube Q8H   insulin aspart  0-20 Units Subcutaneous Q4H   insulin detemir  20 Units Subcutaneous BID   mouth rinse  15 mL Mouth Rinse 10 times per day   nutrition supplement (JUVEN)  1 packet Per Tube BID BM   OLANZapine  2.5 mg Per Tube Daily   OLANZapine  5 mg Per Tube QHS   oxyCODONE  2.5 mg Per Tube Q8H   pantoprazole sodium  40 mg Per Tube Daily   scopolamine  1 patch Transdermal Q72H   Continuous Infusions:  feeding supplement (GLUCERNA 1.5 CAL) 1,000 mL (02/12/21 1544)    Principal Problem:   ICH (intracerebral hemorrhage) ([place]) Active Problems:   Malignant hypertension   Diabetes mellitus type 2 in obese (HCC)   Acute respiratory failure (HCC)   Tracheostomy dependence ([place])   Pneumonia due to Serratia marcescens ([place])   MSSA (methicillin susceptible Staphylococcus aureus) pneumonia ([place])   Stage 3b chronic kidney disease (CKD) ([place])   Acute kidney injury ([place])   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 ([place])   Acute hypernatremia   Dysphagia due to recent stroke   Acute urinary retention   Respiration abnormal   Pressure injury of skin   Abdominal distention   Intractable hiccups   Pneumonia due to Klebsiella pneumoniae ([place])   Swollen lip   Fever   Muscular hypertonicity   Consultants: Neurology PCCM Medicine  Procedures: Echocardiogram EEG Cortrack PEG tube placement Tracheostomy tube placement  Antibiotics: Zosyn  12/15 through 12/21 Cefepime 12/24 through 12/27 Bactrim 12/27 >> 12/29 Cefepime 12/31 through 1/5   Time spent: 25 minutes    Erin Hearing ANP  [facility] 7 am - 330 pm/M-F for direct patient care and secure chat Please refer to Amion for contact info 45  days

## 2021-02-13 NOTE — Plan of Care (Signed)
  Problem: Nutrition: Goal: Adequate nutrition will be maintained Outcome: Progressing   Problem: Elimination: Goal: Will not experience complications related to bowel motility Outcome: Progressing Goal: Will not experience complications related to urinary retention Outcome: Progressing   

## 2021-02-13 NOTE — Progress Notes (Signed)
Nutrition Follow-up  DOCUMENTATION CODES:   Not applicable  INTERVENTION:  Continue TF via PEG: Glucerna 1.5 @ 36m/hr (14469mday) Free water per MD, currently 100108mree water Q6H   Tube feeding regimen provides 2160 kcal, 118 grams of protein, and 1092 ml of H2O (1492m7mtal free water).  -1 packet Juven BID, each packet provides 95 calories, 2.5 grams of protein (collagen), and 9.8 grams of carbohydrate (3 grams sugar); also contains 7 grams of L-arginine and L-glutamine, 300 mg vitamin C, 15 mg vitamin E, 1.2 mcg vitamin B-12, 9.5 mg zinc, 200 mg calcium, and 1.5 g  Calcium Beta-hydroxy-Beta-methylbutyrate to support wound healing  NUTRITION DIAGNOSIS:   Inadequate oral intake related to lethargy/confusion, dysphagia as evidenced by NPO status.  ongoing  GOAL:   Patient will meet greater than or equal to 90% of their needs  Met with TF  MONITOR:   Diet advancement, Labs, Weight trends  REASON FOR ASSESSMENT:   Ventilator, Consult Enteral/tube feeding initiation and management  ASSESSMENT:   55 y36r old male who presented to the ED on 11/29 with AMS. PMH of HTN, T2DM, CKD stage III. 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  11/30 cortrak placed; tip gastric 12/2 trickle TF 12/3 pt intubated due to increased WOB, +emesis, TF held 12/4 trickle TF restarted 12/5 advancement orders placed 12/9 TF adjusted 12/13 s/p tracheostomy 12/16 s/p EGD and PEG placement 12/19 tx [facility] of ICU to [facility]/23 developed Cheyne-Stokes respiratory pattern with respiratory alkalosis.Transferred to ICU for short-term mechanical ventilation and was found to have new tracheobronchitis now on cefepime 12/25 tx back to [facility]9 trach changed to #6 shiley cuffless   Pt continues to be in unresponsive, vegetative state. Continues to require TF via PEG and tolerating w/o issue.  Continues to await placement.  Current TF: Glucerna  1.5 @ 60ml32mw/ 100ml 57m water Q6H  UOP: 2200ml x81mours I/O: +1549ml si44madmit  Current weight: 80.1 kg Admit weight: 85.2 kg   Medications: Scheduled Meds:  amantadine  100 mg Per Tube BID   amLODipine  2.5 mg Per Tube QHS   baclofen  10 mg Per Tube TID AC & HS   bethanechol  25 mg Per Tube TID   carvedilol  25 mg Per Tube BID WC   chlorhexidine gluconate (MEDLINE KIT)  15 mL Mouth Rinse BID   Chlorhexidine Gluconate Cloth  6 each Topical Q0600   cloNIDine  0.2 mg Per Tube BID   fiber  1 packet Per Tube BID   free water  100 mL Per Tube Q6H   heparin injection (subcutaneous)  5,000 Units Subcutaneous Q8H   hydrALAZINE  100 mg Per Tube Q8H   insulin aspart  0-20 Units Subcutaneous Q4H   insulin detemir  20 Units Subcutaneous BID   mouth rinse  15 mL Mouth Rinse 10 times per day   nutrition supplement (JUVEN)  1 packet Per Tube BID BM   OLANZapine  2.5 mg Per Tube Daily   OLANZapine  5 mg Per Tube QHS   oxyCODONE  2.5 mg Per Tube Q8H   pantoprazole sodium  40 mg Per Tube Daily   scopolamine  1 patch Transdermal Q72H  Continuous Infusions:  feeding supplement (GLUCERNA 1.5 CAL) 1,000 mL (02/13/21 1036)   Labs: Recent Labs  Lab 02/09/21 0434  NA 141  K 4.3  CL 106  CO2 27  BUN 45*  CREATININE 1.00  CALCIUM 9.5  GLUCOSE 138*  CBGs: [phone]  Diet Order:   Diet Order             Diet NPO time specified  Diet effective midnight                   EDUCATION NEEDS:   Not appropriate for education at this time  Skin:  Skin Assessment: Skin Integrity Issues: Skin Integrity Issues:: Stage II Stage II: anus  Last BM:  1/05 662m via rectal tube  Height:   Ht Readings from Last 1 Encounters:  01/23/21 6' (1.829 m)    Weight:   Wt Readings from Last 1 Encounters:  02/09/21 80.1 kg   BMI:  Body mass index is 23.95 kg/m.  Estimated Nutritional Needs:   Kcal:  2150-2350  Protein:  105-120 grams  Fluid:  >2L     ATheone Stanley, MS,  RD, LDN (she/her/hers) RD pager number and weekend/on-call pager number located in [place]

## 2021-02-14 LAB — CBC WITH DIFFERENTIAL/PLATELET
Abs Immature Granulocytes: 0.1 10*3/uL — ABNORMAL HIGH (ref 0.00–0.07)
Basophils Absolute: 0 10*3/uL (ref 0.0–0.1)
Basophils Relative: 0 %
Eosinophils Absolute: 0.3 10*3/uL (ref 0.0–0.5)
Eosinophils Relative: 5 %
HCT: 34.5 % — ABNORMAL LOW (ref 39.0–52.0)
Hemoglobin: 10.8 g/dL — ABNORMAL LOW (ref 13.0–17.0)
Immature Granulocytes: 1 %
Lymphocytes Relative: 20 %
Lymphs Abs: 1.4 10*3/uL (ref 0.7–4.0)
MCH: 24.4 pg — ABNORMAL LOW (ref 26.0–34.0)
MCHC: 31.3 g/dL (ref 30.0–36.0)
MCV: 78.1 fL — ABNORMAL LOW (ref 80.0–100.0)
Monocytes Absolute: 0.6 10*3/uL (ref 0.1–1.0)
Monocytes Relative: 9 %
Neutro Abs: 4.8 10*3/uL (ref 1.7–7.7)
Neutrophils Relative %: 65 %
Platelets: 196 10*3/uL (ref 150–400)
RBC: 4.42 MIL/uL (ref 4.22–5.81)
RDW: 17.8 % — ABNORMAL HIGH (ref 11.5–15.5)
WBC: 7.3 10*3/uL (ref 4.0–10.5)
nRBC: 0 % (ref 0.0–0.2)

## 2021-02-14 LAB — COMPREHENSIVE METABOLIC PANEL
ALT: 55 U/L — ABNORMAL HIGH (ref 0–44)
AST: 28 U/L (ref 15–41)
Albumin: 2.5 g/dL — ABNORMAL LOW (ref 3.5–5.0)
Alkaline Phosphatase: 46 U/L (ref 38–126)
Anion gap: 10 (ref 5–15)
BUN: 50 mg/dL — ABNORMAL HIGH (ref 6–20)
CO2: 27 mmol/L (ref 22–32)
Calcium: 9.6 mg/dL (ref 8.9–10.3)
Chloride: 108 mmol/L (ref 98–111)
Creatinine, Ser: 1.01 mg/dL (ref 0.61–1.24)
GFR, Estimated: >60 mL/min (ref >60)
Glucose, Bld: 136 mg/dL — ABNORMAL HIGH (ref 70–99)
Potassium: 4.1 mmol/L (ref 3.5–5.1)
Sodium: 145 mmol/L (ref 135–145)
Total Bilirubin: 0.4 mg/dL (ref 0.3–1.2)
Total Protein: 6.4 g/dL — ABNORMAL LOW (ref 6.5–8.1)

## 2021-02-14 LAB — GLUCOSE, CAPILLARY
Glucose-Capillary: 122 mg/dL — ABNORMAL HIGH (ref 70–99)
Glucose-Capillary: 130 mg/dL — ABNORMAL HIGH (ref 70–99)
Glucose-Capillary: 137 mg/dL — ABNORMAL HIGH (ref 70–99)
Glucose-Capillary: 141 mg/dL — ABNORMAL HIGH (ref 70–99)
Glucose-Capillary: 148 mg/dL — ABNORMAL HIGH (ref 70–99)
Glucose-Capillary: 152 mg/dL — ABNORMAL HIGH (ref 70–99)
Glucose-Capillary: 162 mg/dL — ABNORMAL HIGH (ref 70–99)

## 2021-02-14 NOTE — Plan of Care (Signed)
°  Problem: Clinical Measurements: Goal: Will remain free from infection Outcome: Progressing Goal: Diagnostic test results will improve Outcome: Progressing Goal: Respiratory complications will improve Outcome: Progressing Goal: Cardiovascular complication will be avoided Outcome: Progressing   Problem: Nutrition: Goal: Adequate nutrition will be maintained Outcome: Progressing   Problem: Coping: Goal: Level of anxiety will decrease Outcome: Progressing   Problem: Elimination: Goal: Will not experience complications related to bowel motility Outcome: Progressing

## 2021-02-14 NOTE — Plan of Care (Signed)
°  Problem: Education: Goal: Knowledge of General Education information will improve Description: Including pain rating scale, medication(s)/side effects and non-pharmacologic comfort measures Outcome: Not Progressing   Problem: Health Behavior/Discharge Planning: Goal: Ability to manage health-related needs will improve Outcome: Not Progressing   Problem: Clinical Measurements: Goal: Ability to maintain clinical measurements within normal limits will improve Outcome: Not Progressing Goal: Will remain free from infection Outcome: Not Progressing Goal: Diagnostic test results will improve Outcome: Not Progressing Goal: Respiratory complications will improve Outcome: Not Progressing Goal: Cardiovascular complication will be avoided Outcome: Not Progressing   Problem: Activity: Goal: Risk for activity intolerance will decrease Outcome: Not Progressing   Problem: Nutrition: Goal: Adequate nutrition will be maintained Outcome: Not Progressing   Problem: Coping: Goal: Level of anxiety will decrease Outcome: Not Progressing   Problem: Elimination: Goal: Will not experience complications related to bowel motility Outcome: Not Progressing Goal: Will not experience complications related to urinary retention Outcome: Not Progressing   Problem: Pain Managment: Goal: General experience of comfort will improve Outcome: Not Progressing   Problem: Safety: Goal: Ability to remain free from injury will improve Outcome: Not Progressing   Problem: Skin Integrity: Goal: Risk for impaired skin integrity will decrease Outcome: Not Progressing   Problem: Education: Goal: Knowledge of disease or condition will improve Outcome: Not Progressing Goal: Knowledge of secondary prevention will improve (SELECT ALL) Outcome: Not Progressing Goal: Knowledge of patient specific risk factors will improve (INDIVIDUALIZE FOR PATIENT) Outcome: Not Progressing Goal: Individualized Educational  Video(s) Outcome: Not Progressing   Problem: Coping: Goal: Will verbalize positive feelings about self Outcome: Not Progressing Goal: Will identify appropriate support needs Outcome: Not Progressing   Problem: Health Behavior/Discharge Planning: Goal: Ability to manage health-related needs will improve Outcome: Not Progressing   Problem: Self-Care: Goal: Ability to participate in self-care as condition permits will improve Outcome: Not Progressing Goal: Verbalization of feelings and concerns over difficulty with self-care will improve Outcome: Not Progressing Goal: Ability to communicate needs accurately will improve Outcome: Not Progressing   Problem: Nutrition: Goal: Risk of aspiration will decrease Outcome: Not Progressing Goal: Dietary intake will improve Outcome: Not Progressing   Problem: Intracerebral Hemorrhage Tissue Perfusion: Goal: Complications of Intracerebral Hemorrhage will be minimized Outcome: Not Progressing

## 2021-02-14 NOTE — Progress Notes (Signed)
°PROGRESS NOTE ° ° ° °Stephen Delgado  MRN:[id number] DOB: 04/11/1965 DOA: 12/30/2020 °PCP: Patient, [name]  ° °Chief Complain: Headache, altered mental status ° °Brief Narrative: ° °Patient is a 56-year-old male from [place] with history of diabetes type 2, hypertension, CKD who presented here on 11/29 with complaints of headache, altered mental status.  On presentation he was found to be hypertensive, agitated, required restraints.  CT showed intraventricular hemorrhage involving the lateral, third, fourth ventricle, ICH and left basal ganglia.  He was started on Cleviprex drip and was admitted to neuro ICU.  He got intubated which ended up and tracheostomy.  He has been weaned from vent to trach collar.  Status post PEG placement.  Palliative care was also consulted during this hospitalization, family wanted to continue aggressive care, currently full code.  Hospital course remarkable for persistent neurological impairment, aspiration events, AKI, urinary retention.  Waiting for placement. 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  Follow-up MRI on 12/16 showed acute  infarcts. °Remains encephalopathic. ° °Acute respiratory failure with hypoxia: Intubated on admission.  Extubated ,now currently on trach collar,on 5L  Hospital course remarkable for pneumonia.  Completed antibiotics course. ° °Hypertension: Continue Coreg, hydralazine, clonidine.  Monitor blood pressure ° °AKI and CKD stage IIIb: Currently kidney  in close to baseline. ° °Type 2 diabetes: Hemoglobin A1c of  6.3.  Continue current insulin regimen.   ° °Protein calorie malnutrition/dysphagia: On tube feeding via PEG ° °Disposition/goals of care: Remains full code despite having poor quality of life.  Family wants to continue aggressive treatment.  Pursuing surgical nursing facility placement ° °Pressure Injury 01/23/21 Anus Medial Stage 2 -  Partial thickness loss of dermis presenting as a shallow open injury with a red, pink wound bed without slough. Two red open areas approximately 0.5x1cm related to flexiseal (Active)  °01/23/21 1300  °Location: Anus  °Location Orientation: Medial  °Staging: Stage 2 -  Partial thickness loss of dermis presenting as a shallow open injury with a red, pink wound bed without slough.  °Wound Description (Comments): Two red open areas approximately 0.5x1cm related to flexiseal (Foam placed)  °Present on Admission: No (Present on arrival to 4NICU)  ° ° ° ° ° ° °Nutrition Problem: Inadequate oral intake °Etiology: lethargy/confusion, dysphagia ° °  ° ° °DVT prophylaxis:Heparin [place] °Code Status: Full °Family Communication: Family at the bedside °Patient status: ° °Dispo: The patient is from: Home °             Anticipated d/c is to: SNF °             Anticipated d/c date is: Not sure ° °Consultants:   PCCM,neurology ° °Procedures: Intubation, PEG procedure, tracheostomy ° °Antimicrobials:  °Anti-infectives (From admission, onward)  ° ° Start     Dose/Rate Route Frequency Ordered Stop  ° 02/03/21 2200  ceFEPIme (MAXIPIME) 2 g in sodium chloride 0.9 % 100 mL IVPB       ° 2 g °200 mL/hr over 30  Minutes Intravenous Every 8 hours 02/03/21 1153 02/05/21 2142  ° 01/30/21 1000  ceFEPIme (MAXIPIME) 2 g in sodium chloride 0.9 % 100 mL IVPB  Status:  Discontinued       ° 2 g °200 mL/hr over 30 Minutes Intravenous Every 12 hours 01/30/21 0832 02/03/21 1153  ° 01/27/21 1900  sulfamethoxazole-trimethoprim (BACTRIM DS) 800-160 MG per tablet 1 tablet  Status:  Discontinued       ° 1 tablet Per Tube Every 12 hours 01/27/21 1403 01/30/21 0808  ° 01/24/21 1100  ceFEPIme (MAXIPIME) 2 g in sodium chloride 0.9 % 100 mL IVPB  Status:  Discontinued       ° 2 g °200 mL/hr over 30 Minutes Intravenous Every 8 hours 01/24/21 1025 01/27/21 1403  ° 01/15/21 1630  piperacillin-tazobactam (ZOSYN) IVPB 3.375 g       ° 3.375 g °12.5 mL/hr over 240 Minutes Intravenous Every 8 hours 01/15/21 0943 01/22/21 0400  ° 01/15/21 1030  piperacillin-tazobactam (ZOSYN) IVPB 3.375 g       ° 3.375 g °100 mL/hr over 30 Minutes Intravenous  Once 01/15/21 0943 01/15/21 1035  ° 01/05/21 1600  ceFAZolin (ANCEF) IVPB 2g/100 mL premix       ° 2 g °200 mL/hr over 30 Minutes Intravenous Every 8 hours 01/05/21 0853 01/10/21 0848  ° 01/03/21 0830  piperacillin-tazobactam (ZOSYN) IVPB 3.375 g  Status:  Discontinued       ° 3.375 g °12.5 mL/hr over 240 Minutes Intravenous Every 8 hours 01/03/21 0800 01/05/21 0853  ° °  ° ° °Subjective: °Patient seen and examined at the bedside this morning.  Hemodynamically stable.  Lying on bed, sleeping, did not wake up on calling his name.  Family at the bedside.  Looks overall comfortable, not in any kind of distress. ° °Objective: °Vitals:  ° 02/14/21 0005 02/14/21 0012 02/14/21 0327 02/14/21 0409  °BP: 121/87   127/84  °Pulse: 96 94 89 87  °Resp: 18 18 (!) 22 20  °Temp: 98.8 °F (37.1 °C)   (!) 97.5 °F (36.4 °C)  °TempSrc: Axillary   Axillary  °SpO2: 96%  95% 97%  °Weight:      °Height:      ° ° °Intake/Output Summary (Last 24 hours) at 02/14/2021 0748 °Last data filed at 02/13/2021 2159 °Gross per 24 hour  °Intake --  °Output  650 ml  °Net -650 ml  ° °Filed Weights  ° 02/06/21 0500 02/07/21 0500 02/09/21 0500  °Weight: 80.1 kg 80.2 kg 80.1 kg  ° ° °Examination: ° °General exam: Weak, deconditioned °HEENT: Trach °Respiratory system:  no wheezes or crackles  °Cardiovascular system: S1 & S2 heard, RRR.  °Gastrointestinal system: Abdomen is nondistended, soft and nontender.  PEG °Central nervous system: Not alert or oriented  °Extremities: No edema, no clubbing ,no cyanosis °Skin: No rashes, no ulcers,no icterus  °GU: Rectal tube ° ° ° °Data Reviewed: I have personally reviewed following labs and imaging studies ° °CBC: °Recent Labs  °Lab 02/09/21 °0434 02/14/21 °0352  °WBC 6.3 7.3  °NEUTROABS  --  4.8  °HGB 10.9* 10.8*  °HCT 34.6* 34.5*  °MCV 76.5* 78.1*  °PLT   PLT 206 737   Basic Metabolic Panel: Recent Labs  Lab 02/09/21 0434 02/14/21 0352  NA 141 145  K 4.3 4.1  CL 106 108  CO2 27 27  GLUCOSE 138* 136*  BUN 45* 50*  CREATININE 1.00 1.01  CALCIUM 9.5 9.6   GFR: Estimated Creatinine Clearance: 88.6 mL/min (by C-G formula based on SCr of 1.01 mg/dL). Liver Function Tests: Recent Labs  Lab 02/09/21 0434 02/14/21 0352  AST 27 28  ALT 49* 55*  ALKPHOS 48 46  BILITOT 0.4 0.4  PROT 6.5 6.4*  ALBUMIN 2.3* 2.5*   No results for input(s): LIPASE, AMYLASE in the last 168 hours. No results for input(s): AMMONIA in the last 168 hours. Coagulation Profile: No results for input(s): INR, PROTIME in the last 168 hours. Cardiac Enzymes: No results for input(s): CKTOTAL, CKMB, CKMBINDEX, TROPONINI in the last 168 hours. BNP (last 3 results) No results for input(s): PROBNP in the last 8760 hours. HbA1C: No results for input(s): HGBA1C in the last 72 hours. CBG: Recent Labs  Lab 02/13/21 1219 02/13/21 1702 02/13/21 2001 02/14/21 0006 02/14/21 0420  GLUCAP 153* 170* 173* 122* 141*   Lipid Profile: No results for input(s): CHOL, HDL, LDLCALC, TRIG, CHOLHDL, LDLDIRECT in the last 72 hours. Thyroid Function Tests: No  results for input(s): TSH, T4TOTAL, FREET4, T3FREE, THYROIDAB in the last 72 hours. Anemia Panel: No results for input(s): VITAMINB12, FOLATE, FERRITIN, TIBC, IRON, RETICCTPCT in the last 72 hours. Sepsis Labs: No results for input(s): PROCALCITON, LATICACIDVEN in the last 168 hours.  No results found for this or any previous visit (from the past 240 hour(s)).       Radiology Studies: DG CHEST PORT 1 VIEW  Result Date: 02/12/2021 CLINICAL DATA:  Tachypnea, diabetes mellitus, hypertension EXAM: PORTABLE CHEST 1 VIEW COMPARISON:  Portable exam 1153 hours compared to 01/29/2021 FINDINGS: Tracheostomy tube projects over tracheal air column. Enlargement of cardiac silhouette. Mediastinal contours and pulmonary vascularity normal. Minimal RIGHT basilar atelectasis. Lungs otherwise clear. No infiltrate, pleural effusion, or pneumothorax. Gastrostomy tube projects over stomach. Osseous structures unremarkable. IMPRESSION: Enlargement of cardiac silhouette with minimal RIGHT basilar atelectasis. Electronically Signed   By: Lavonia Dana M.D.   On: 02/12/2021 12:29        Scheduled Meds:  amantadine  100 mg Per Tube BID   amLODipine  2.5 mg Per Tube QHS   baclofen  10 mg Per Tube TID AC & HS   bethanechol  25 mg Per Tube TID   carvedilol  25 mg Per Tube BID WC   chlorhexidine gluconate (MEDLINE KIT)  15 mL Mouth Rinse BID   Chlorhexidine Gluconate Cloth  6 each Topical Q0600   cloNIDine  0.2 mg Per Tube BID   fiber  1 packet Per Tube BID   free water  100 mL Per Tube Q6H   heparin injection (subcutaneous)  5,000 Units Subcutaneous Q8H   hydrALAZINE  100 mg Per Tube Q8H   insulin aspart  0-20 Units Subcutaneous Q4H   insulin detemir  20 Units Subcutaneous BID   mouth rinse  15 mL Mouth Rinse 10 times per day   nutrition supplement (JUVEN)  1 packet Per Tube BID BM   OLANZapine  2.5 mg Per Tube Daily   OLANZapine  5 mg Per Tube QHS   oxyCODONE  2.5 mg Per Tube Q8H   pantoprazole sodium  40  mg Per Tube Daily   scopolamine  1 patch Transdermal Q72H   Continuous Infusions:  feeding supplement (GLUCERNA 1.5 CAL) 1,000 mL (02/13/21 1036)  ° ° ° LOS: 46 days  ° ° °Time spent: 35 mins.More than 50% of that time was spent in counseling and/or coordination of care. ° ° ° ° ° ° , MD °[facility] °P1/14/2023, 7:48 AM   °

## 2021-02-15 LAB — GLUCOSE, CAPILLARY
Glucose-Capillary: 148 mg/dL — ABNORMAL HIGH (ref 70–99)
Glucose-Capillary: 150 mg/dL — ABNORMAL HIGH (ref 70–99)
Glucose-Capillary: 155 mg/dL — ABNORMAL HIGH (ref 70–99)
Glucose-Capillary: 169 mg/dL — ABNORMAL HIGH (ref 70–99)
Glucose-Capillary: 177 mg/dL — ABNORMAL HIGH (ref 70–99)
Glucose-Capillary: 196 mg/dL — ABNORMAL HIGH (ref 70–99)

## 2021-02-15 MED ORDER — GUAIFENESIN 100 MG/5ML PO LIQD
5.0000 mL | ORAL | Status: DC | PRN
  Administered 2021-02-15 – 2021-04-29 (×13): 5 mL
  Filled 2021-02-15 (×11): qty 5

## 2021-02-15 MED ORDER — GUAIFENESIN 100 MG/5ML PO LIQD
5.0000 mL | ORAL | Status: DC | PRN
  Filled 2021-02-15: qty 5

## 2021-02-15 NOTE — Progress Notes (Addendum)
PROGRESS NOTE    Stephen Delgado  UVO:[id number] DOB: Jul 28, 1965 DOA: 12/30/2020 PCP: Patient, [name]   Chief Complain: Headache, altered mental status  Brief Narrative:  Patient is a 56 year old male from [place] with history of diabetes type 2, hypertension, CKD who presented here on 11/29 with complaints of headache, altered mental status.  On presentation he was found to be hypertensive, agitated, required restraints.  CT showed intraventricular hemorrhage involving the lateral, third, fourth ventricle, ICH and left basal ganglia.  He was started on Cleviprex drip and was admitted to neuro ICU.  He got intubated which ended up and tracheostomy.  He has been weaned from vent to trach collar.  Status post PEG placement.  Palliative care was also consulted during this hospitalization, family wanted to continue aggressive care, currently full code.  Hospital course remarkable for persistent neurological impairment, aspiration events, AKI, urinary retention.  Waiting for placement.   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  Follow-up MRI on 12/16 showed acute  infarcts. Remains encephalopathic.  Acute respiratory failure with hypoxia: Intubated on admission.  Extubated ,now currently on trach [facility] course remarkable for pneumonia.  Completed antibiotics course.  Hypertension: Continue Coreg, hydralazine, clonidine.  Monitor blood pressure  AKI and CKD stage IIIb: Currently kidney  in close to baseline.  Type 2 diabetes: Hemoglobin A1c of  6.3.  Continue current insulin regimen.    Protein calorie malnutrition/dysphagia: On tube feeding via PEG  Disposition/goals of care: Remains full code despite having poor quality of life.  Family wants to continue aggressive treatment.  Pursuing skilled nursing facility placement  Pressure Injury 01/23/21 Anus Medial Stage 2 -  Partial thickness loss of dermis presenting as a shallow open injury with a red, pink wound bed without slough. Two red open areas approximately 0.5x1cm related to flexiseal (Active)  01/23/21 1300  Location: Anus  Location Orientation: Medial  Staging: Stage 2 -  Partial thickness loss of dermis presenting as a shallow open injury with a red, pink wound bed without slough.  Wound Description (Comments): Two red open areas approximately 0.5x1cm related to flexiseal (Foam placed)  Present on Admission: No (Present on arrival to 4NICU)        Nutrition Problem: Inadequate oral intake Etiology: lethargy/confusion, dysphagia      DVT prophylaxis:Heparin [place] Code Status: Full Family Communication: Family at the bedside on 1/14 Patient status:  Dispo: The patient is from: Home              Anticipated d/c is to: SNF              Anticipated d/c date is: Not sure  Consultants: PCCM,neurology  Procedures: Intubation, PEG procedure, tracheostomy  Antimicrobials:  Anti-infectives (From admission, onward)    Start     Dose/Rate Route Frequency Ordered Stop   02/03/21 2200  ceFEPIme (MAXIPIME) 2 g in sodium chloride 0.9 % 100 mL IVPB        2 g 200 mL/hr  over 30 Minutes Intravenous Every 8 hours 02/03/21 1153 02/05/21 2142   01/30/21 1000  ceFEPIme (MAXIPIME) 2 g in sodium chloride 0.9 % 100 mL IVPB  Status:  Discontinued        2 g 200 mL/hr over 30 Minutes Intravenous Every 12 hours 01/30/21 0832 02/03/21 1153   01/27/21 1900  sulfamethoxazole-trimethoprim (BACTRIM DS) 800-160 MG per tablet 1 tablet  Status:  Discontinued        1 tablet Per Tube Every 12 hours 01/27/21 1403 01/30/21 0808   01/24/21 1100  ceFEPIme (MAXIPIME) 2 g in sodium chloride 0.9 % 100 mL IVPB  Status:  Discontinued        2 g 200 mL/hr over 30 Minutes Intravenous Every 8 hours 01/24/21 1025 01/27/21 1403   01/15/21 1630  piperacillin-tazobactam (ZOSYN) IVPB 3.375 g        3.375 g 12.5 mL/hr over 240 Minutes Intravenous Every 8 hours 01/15/21 0943 01/22/21 0400   01/15/21 1030  piperacillin-tazobactam (ZOSYN) IVPB 3.375 g        3.375 g 100 mL/hr over 30 Minutes Intravenous  Once 01/15/21 0943 01/15/21 1035   01/05/21 1600  ceFAZolin (ANCEF) IVPB 2g/100 mL premix        2 g 200 mL/hr over 30 Minutes Intravenous Every 8 hours 01/05/21 0853 01/10/21 0848   01/03/21 0830  piperacillin-tazobactam (ZOSYN) IVPB 3.375 g  Status:  Discontinued        3.375 g 12.5 mL/hr over 240 Minutes Intravenous Every 8 hours 01/03/21 0800 01/05/21 1610       Subjective: Patient seen and examined at the bedside this morning.  Hemodynamically stable.  Lies on the bed, mostly unresponsive ,sleeping  Objective: Vitals:   02/14/21 2321 02/14/21 2338 02/15/21 0246 02/15/21 0445  BP: (!) 117/46   (!) 142/85  Pulse: 88 87 89 91  Resp: _0 Temp: 98.8 F (37.1 C)   99 F (37.2 C)  TempSrc: Oral   Axillary  SpO2: 96%   98%  Weight:      Height:        Intake/Output Summary (Last 24 hours) at 02/15/2021 0731 Last data filed at 02/15/2021 0445 Gross per 24 hour  Intake --  Output 1650 ml  Net -1650 ml   Filed Weights   02/06/21 0500 02/07/21 0500 02/09/21 0500  Weight:  80.1 kg 80.2 kg 80.1 kg    Examination:   General exam: Deconditioned, ill looking HEENT: Trach, eyes closed  respiratory system:  no wheezes or crackles  Cardiovascular system: S1 & S2 heard, RRR.  Gastrointestinal system: Abdomen is nondistended, soft and nontender. Central nervous system: Not alert or oriented  extremities: No edema, no clubbing ,no cyanosis Skin: No rashes, no ulcers,no icterus   GU: Rectal tube  Data Reviewed: I have personally reviewed following labs and imaging studies  CBC: Recent Labs  Lab 02/09/21 0434 02/14/21 0352  WBC 6.3 7.3  NEUTROABS  --  4.8  HGB 10.9* 10.8*  HCT 34.6* 34.5*  MCV 76.5* 78.1*  PLT 206 960   Basic Metabolic Panel: Recent Labs  Lab 02/09/21 0434 02/14/21 0352  NA  141 145  K 4.3 4.1  CL 106 108  CO2 27 27  GLUCOSE 138* 136*  BUN 45* 50*  CREATININE 1.00 1.01  CALCIUM 9.5 9.6   GFR: Estimated Creatinine Clearance: 88.6 mL/min (by C-G formula based on SCr of 1.01 mg/dL). Liver Function Tests: Recent Labs  Lab 02/09/21 0434 02/14/21 0352  AST 27 28  ALT 49* 55*  ALKPHOS 48 46  BILITOT 0.4 0.4  PROT 6.5 6.4*  ALBUMIN 2.3* 2.5*   No results for input(s): LIPASE, AMYLASE in the last 168 hours. No results for input(s): AMMONIA in the last 168 hours. Coagulation Profile: No results for input(s): INR, PROTIME in the last 168 hours. Cardiac Enzymes: No results for input(s): CKTOTAL, CKMB, CKMBINDEX, TROPONINI in the last 168 hours. BNP (last 3 results) No results for input(s): PROBNP in the last 8760 hours. HbA1C: No results for input(s): HGBA1C in the last 72 hours. CBG: Recent Labs  Lab 02/14/21 1126 02/14/21 1551 02/14/21 2018 02/14/21 2328 02/15/21 0444  GLUCAP 152* 148* 130* 162* 150*   Lipid Profile: No results for input(s): CHOL, HDL, LDLCALC, TRIG, CHOLHDL, LDLDIRECT in the last 72 hours. Thyroid Function Tests: No results for input(s): TSH, T4TOTAL, FREET4, T3FREE, THYROIDAB in the last 72  hours. Anemia Panel: No results for input(s): VITAMINB12, FOLATE, FERRITIN, TIBC, IRON, RETICCTPCT in the last 72 hours. Sepsis Labs: No results for input(s): PROCALCITON, LATICACIDVEN in the last 168 hours.  No results found for this or any previous visit (from the past 240 hour(s)).       Radiology Studies: No results found.      Scheduled Meds:  amantadine  100 mg Per Tube BID   amLODipine  2.5 mg Per Tube QHS   baclofen  10 mg Per Tube TID AC & HS   bethanechol  25 mg Per Tube TID   carvedilol  25 mg Per Tube BID WC   chlorhexidine gluconate (MEDLINE KIT)  15 mL Mouth Rinse BID   Chlorhexidine Gluconate Cloth  6 each Topical Q0600   cloNIDine  0.2 mg Per Tube BID   fiber  1 packet Per Tube BID   free water  100 mL Per Tube Q6H   heparin injection (subcutaneous)  5,000 Units Subcutaneous Q8H   hydrALAZINE  100 mg Per Tube Q8H   insulin aspart  0-20 Units Subcutaneous Q4H   insulin detemir  20 Units Subcutaneous BID   mouth rinse  15 mL Mouth Rinse 10 times per day   nutrition supplement (JUVEN)  1 packet Per Tube BID BM   OLANZapine  2.5 mg Per Tube Daily   OLANZapine  5 mg Per Tube QHS   oxyCODONE  2.5 mg Per Tube Q8H   pantoprazole sodium  40 mg Per Tube Daily   scopolamine  1 patch Transdermal Q72H   Continuous Infusions:  feeding supplement (GLUCERNA 1.5 CAL) 1,000 mL (02/15/21 0414)     LOS: 67 days          Shelly Coss, MD [facility] P1/15/2023, 7:31 AM

## 2021-02-16 DIAGNOSIS — J42 Unspecified chronic bronchitis: Secondary | ICD-10-CM

## 2021-02-16 LAB — GLUCOSE, CAPILLARY
Glucose-Capillary: 169 mg/dL — ABNORMAL HIGH (ref 70–99)
Glucose-Capillary: 176 mg/dL — ABNORMAL HIGH (ref 70–99)
Glucose-Capillary: 193 mg/dL — ABNORMAL HIGH (ref 70–99)
Glucose-Capillary: 215 mg/dL — ABNORMAL HIGH (ref 70–99)
Glucose-Capillary: 169 mg/dL — ABNORMAL HIGH (ref 70–99)
Glucose-Capillary: 195 mg/dL — ABNORMAL HIGH (ref 70–99)

## 2021-02-16 NOTE — Progress Notes (Signed)
Sputum sample collected.  RN labeled sample and states she will walk it to the lab w/ requisition.

## 2021-02-16 NOTE — Progress Notes (Signed)
[facility] PROGRESS NOTE  Grainger Mccarley Nosal VOZ:[id number] DOB: March 14, 1965 DOA: 12/30/2020 PCP: Patient, [name]   01/29/2021    01/29/2021                 02/04/2020  Status: Remains inpatient appropriate because:  Unsafe discharge plan.  Patient in persistent vegetative state per stroke needs placement at skilled nursing facility that is trach capable-medically stable for discharge noting trach has been in place now for 30 days  Barriers to discharge: Social: Current insurance coverage about to expire and he otherwise has no funding for long-term care.  Financial services clarifying regarding eligibility for disability and Medicaid  Clinical: Tracheostomy and PEG dependent with recurrent tracheobronchitis; remains a full code  Level of care:  Med-Surg    Code Status: Full code Family Communication: 1/13 called son [name].  HIPAA appropriate voicemail left DVT prophylaxis: SCDs COVID vaccination status: Unknown  Foley catheter: 85 French inserted on 12/17; changed on 12/24 POA: No  HPI: 56 y.o. male with PMH significant for DM2, HTN, CKD with questionable compliance to medications Patient presented to the ED on 12/30/2020 with complaint of headache, altered mental status   In the ED, he was hypertensive to 238/160, agitated, required restraints CT head showed large amount of intraventricular hemorrhage involving lateral/third/fourth ventricle, ICH and left basal ganglia as well as chronic microvascular ischemic changes. He was started on Cleviprex drip and admitted to neuro ICU   He subsequently had CT head repeated on 11/30, 12/1 and 12/3, all demonstrated unchanged intraparenchymal hemorrhage and intraventricular hemorrhage 12/3, MRI brain from also showed numerous small acute infarcts in both cerebral hemisphere with minimal improvement in the right superior cerebellum 12/3, intubated 12/13, tracheostomy 12/15, wean from vent to trach collar 12/16,  PEG tube placement 12/16, repeat MRI showed an increase in the size and number of acute infarcts in the bilateral hemispheres with increased involvement in the cerebellum bilaterally.   12/18, transferred from neuro service to [facility]  12/21, palliative care consulted.  Family wanted to continue aggressive care as full CODE STATUS. 12/23 developed Cheyne-Stokes respiratory pattern with respiratory alkalosis.  Transferred to ICU for short-term mechanical ventilation and was found to have new tracheobronchitis now on cefepime 12/25 back to progressive floor and to [facility]   His hospital course has been complicated by significant neurological impairment, aspiration events, AKI, hypernatremia, urinary retention. 1/9 trach changed to #6.0 [place] cuffless--1/10 > 30 days since trach inserted.  Subjective: Unresponsive.  Appeared to be attempting to open eyes but was unable to do so.  Objective: Vitals:   02/16/21 0405 02/16/21 0720  BP: (!) 147/83   Pulse: 98 97  Resp: 20 (!) 23  Temp: 98.4 F (36.9 C)   SpO2: 97% 99%    Intake/Output Summary (Last 24 hours) at 02/16/2021 0741 Last data filed at 02/16/2021 0406 Gross per 24 hour  Intake --  Output 2250 ml  Net -2250 ml   Filed Weights   02/06/21 0500 02/07/21 0500 02/09/21 0500  Weight: 80.1 kg 80.2 kg 80.1 kg    Exam:  Constitutional: Unresponsive, NAD Respiratory: #6.0 Shiley cuffless trach,  Lung sounds more coarse but clear without rhonchi today, trach dressing with thick tan secretions once again noted.  Normal respiratory pattern today Cardiovascular: S1-S2, NSR, essentially normotensive and previous tachycardia has resolved, no bilateral lower extremity edema  Abdomen:  Abdomen soft and nontender.  PEG tube -normoactive bowels -rectal Foley in place  Neurologic: Unable to accurately test  cranial nerves.  Appears to have a more normal respiratory pattern today other than tachypnea.  Upper extremities are flaccid to PROM.-No further  hypertonicity of lower extremities elicited with PROM.  Psychiatric: Unresponsive   Assessment/Plan: Acute problems: Brain injury 2/2 acute intraparenchymal and intraventricular hemorrhage/Bilateral multifocal ischemic infarcts  -Follow-up MRI on 12/16 demonstrated increase in size and number of acute infarcts  -Neurology signed off -currently not on blood thinners because of intracranial hemorrhage. -Has intermittent episodes of [place] and Cheyne-Stokes respiratory patterns alternating with normal respiratory pattern -Continue baclofen for hypertonicity; continue scheduled Oxy IR for suspected underlying pain  - continue Symmetrel and Zyprexa  EOL discussion -per PMT 1/8 family requests that current level of care be continued including full CODE STATUS  Acute respiratory failure with hypoxia 2/2 recurrent Serratia tracheobronchitis (recent MSSA and serratia pneumonia; recent Klebsiella tracheobronchitis); -Completed Zosyn on 12/21 -12/23 recurrent acute tracheobronchitis-completed 7 days of Cefepime as rec by PCCM based on cxs -Continue scopolamine for excessive oral secretions to help minimize risk for aspiration -16 continues to have thick moderate to high volume tracheal secretions that are yellow and tan in color and are very purulent.  Recent chest x-ray unremarkable.  Remains afebrile and has not had leukocytosis.  Discussed with PCCM and plan is to reculture tracheal aspirate  Stage II perirectal pressure wound (resolved) -Consider discontinuation of rectal tube once volume of liquid stool has decreased -Scheduled MiraLAX recently changed to prn any significant change in stool consistency -Continue Neutrisource fiber pack  Bilateral lower extremity edema -Diuresed 7850 cc UOP after one-time dose of 40 mg IV Lasix -BUN 44 with creatinine 1.16 which is stable  Essential hypertension/EKG with LVH strain -Blood pressure on admission was elevated to over 509 systolic. -Continue  max dose Coreg, hydralazine and clonidine -BP poorly controlled in the evenings and overnight so will adjust clonidine from 0.1 mg TID to 0.2 mg BID and add Norvasc 2.5 mg at HS -could be from hypertonicity and ? Awakening? so will increase Baclofen   AKI on CKD 3b -Baseline renal function: January 2022 BUN was 18 and creatinine 1.49 -Current creatinine 1.18 with a BUN of 37   Hypernatremia Resolved  Non obstructive transaminitis -Recurrent issue throughout hospitalization -CT abdomen and pelvis 12/23 unremarkable no evidence of cirrhosis or other biliary tree issues -LFTs improved with mild elevation in ALT of 49 as of 1/9 -Hepatitis panel unremarkable  Type 2 diabetes mellitus -A1c 6.3 in 12/31/2020 -Continue Levemir 20 units BID along with SSI every 4 hours -CBGs well controlled   Protein calorie malnutrition/dysphagia Nutrition Problem: Inadequate oral intake Etiology: lethargy/confusion, dysphagia Signs/Symptoms: NPO status Interventions: Tube feeding via PEG Body mass index is 23.95 kg/m.    Acute urinary retention  -Continue Urecholine -Foley catheter discontinued on 1/11 and patient voiding easily      Data Reviewed: Basic Metabolic Panel: Recent Labs  Lab 02/14/21 0352  NA 145  K 4.1  CL 108  CO2 27  GLUCOSE 136*  BUN 50*  CREATININE 1.01  CALCIUM 9.6    CBC: Recent Labs  Lab 02/14/21 0352  WBC 7.3  NEUTROABS 4.8  HGB 10.8*  HCT 34.5*  MCV 78.1*  PLT 196     CBG: Recent Labs  Lab 02/15/21 1213 02/15/21 1608 02/15/21 2016 02/15/21 2347 02/16/21 0401  GLUCAP 177* 196* 155* 169* 169*      Scheduled Meds:  amantadine  100 mg Per Tube BID   amLODipine  2.5 mg Per Tube QHS  baclofen  10 mg Per Tube TID AC & HS   bethanechol  25 mg Per Tube TID   carvedilol  25 mg Per Tube BID WC   chlorhexidine gluconate (MEDLINE KIT)  15 mL Mouth Rinse BID   Chlorhexidine Gluconate Cloth  6 each Topical Q0600   cloNIDine  0.2 mg Per Tube BID    fiber  1 packet Per Tube BID   free water  100 mL Per Tube Q6H   heparin injection (subcutaneous)  5,000 Units Subcutaneous Q8H   hydrALAZINE  100 mg Per Tube Q8H   insulin aspart  0-20 Units Subcutaneous Q4H   insulin detemir  20 Units Subcutaneous BID   mouth rinse  15 mL Mouth Rinse 10 times per day   nutrition supplement (JUVEN)  1 packet Per Tube BID BM   OLANZapine  2.5 mg Per Tube Daily   OLANZapine  5 mg Per Tube QHS   oxyCODONE  2.5 mg Per Tube Q8H   pantoprazole sodium  40 mg Per Tube Daily   scopolamine  1 patch Transdermal Q72H   Continuous Infusions:  feeding supplement (GLUCERNA 1.5 CAL) 1,000 mL (02/16/21 0003)    Principal Problem:   ICH (intracerebral hemorrhage) ([place]) Active Problems:   Malignant hypertension   Diabetes mellitus type 2 in obese ([place])   Acute respiratory failure ([place])   Tracheostomy dependence ([place])   Pneumonia due to Serratia marcescens ([place])   MSSA (methicillin susceptible Staphylococcus aureus) pneumonia ([place])   Stage 3b chronic kidney disease (CKD) ([place])   Acute kidney injury ([place])   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 ([place])   Acute hypernatremia   Dysphagia due to recent stroke   Acute urinary retention   Respiration abnormal   Pressure injury of skin   Abdominal distention   Intractable hiccups   Pneumonia due to Klebsiella pneumoniae ([place])   Swollen lip   Fever   Muscular hypertonicity   Consultants: Neurology PCCM Medicine  Procedures: Echocardiogram EEG Cortrack PEG tube placement Tracheostomy tube placement  Antibiotics: Zosyn 12/15 through 12/21 Cefepime 12/24 through 12/27 Bactrim 12/27 >> 12/29 Cefepime 12/31 through 1/5   Time spent: 25 minutes    Erin Hearing ANP  [facility] 7 am - 330 pm/M-F for direct patient care and secure chat Please refer to Amion for contact info 48  days

## 2021-02-16 NOTE — Progress Notes (Signed)
°   02/16/21 1909  Assess: MEWS Score  BP (!) 155/89  Pulse Rate 99  Resp (!) 29  SpO2 95 %  O2 Device Tracheostomy Collar  O2 Flow Rate (L/min) 5 L/min  FiO2 (%) 21 %  Assess: MEWS Score  MEWS Temp 0  MEWS Systolic 0  MEWS Pulse 0  MEWS RR 2  MEWS LOC 2  MEWS Score 4  MEWS Score Color Red  Assess: if the MEWS score is Yellow or Red  Were vital signs taken at a resting state? Yes  Focused Assessment No change from prior assessment  Early Detection of Sepsis Score *See Row Information* Medium  MEWS guidelines implemented *See Row Information* No, previously red, continue vital signs every 4 hours

## 2021-02-16 NOTE — Progress Notes (Signed)
02/16/2021  I have seen and evaluated the patient for trach management.  S:  Secretions increased Remains in persistent vegetative state, trach collar.  O: Blood pressure (!) 149/88, pulse 96, temperature 98.4 F (36.9 C), resp. rate (!) 28, height 6' (1.829 m), weight 80.1 kg, SpO2 95 %.  No distress Trach with thick mucopurulent secretions Lungs with rhonci Poorly responsive, per nursing occasionally will track Developing contractures  No new chest imaging  A:  Ongoing trach dependence with increased secretion burden Devastating spontaneous intraparenchymal hemorrhage now in a persistent vegetative state  P:  - Check tracheal aspirate, hold abx pending results - Continue TC and usual trach bundle per RT protocol - Placement efforts per primary - Will see again next week, call if something grows  Myrla Halsted MD [facility] Prefer epic messenger for cross cover needs If after hours, please call E-link

## 2021-02-17 LAB — COMPREHENSIVE METABOLIC PANEL
ALT: 63 U/L — ABNORMAL HIGH (ref 0–44)
AST: 31 U/L (ref 15–41)
Albumin: 2.7 g/dL — ABNORMAL LOW (ref 3.5–5.0)
Alkaline Phosphatase: 57 U/L (ref 38–126)
Anion gap: 13 (ref 5–15)
BUN: 61 mg/dL — ABNORMAL HIGH (ref 6–20)
CO2: 25 mmol/L (ref 22–32)
Calcium: 10 mg/dL (ref 8.9–10.3)
Chloride: 109 mmol/L (ref 98–111)
Creatinine, Ser: 1.17 mg/dL (ref 0.61–1.24)
GFR, Estimated: >60 mL/min (ref >60)
Glucose, Bld: 199 mg/dL — ABNORMAL HIGH (ref 70–99)
Potassium: 4.3 mmol/L (ref 3.5–5.1)
Sodium: 147 mmol/L — ABNORMAL HIGH (ref 135–145)
Total Bilirubin: 0.6 mg/dL (ref 0.3–1.2)
Total Protein: 7.2 g/dL (ref 6.5–8.1)

## 2021-02-17 LAB — GLUCOSE, CAPILLARY
Glucose-Capillary: 176 mg/dL — ABNORMAL HIGH (ref 70–99)
Glucose-Capillary: 177 mg/dL — ABNORMAL HIGH (ref 70–99)
Glucose-Capillary: 207 mg/dL — ABNORMAL HIGH (ref 70–99)
Glucose-Capillary: 192 mg/dL — ABNORMAL HIGH (ref 70–99)
Glucose-Capillary: 189 mg/dL — ABNORMAL HIGH (ref 70–99)

## 2021-02-17 LAB — CBC WITH DIFFERENTIAL/PLATELET
Abs Immature Granulocytes: 0.04 10*3/uL (ref 0.00–0.07)
Basophils Absolute: 0 10*3/uL (ref 0.0–0.1)
Basophils Relative: 0 %
Eosinophils Absolute: 0.1 10*3/uL (ref 0.0–0.5)
Eosinophils Relative: 1 %
HCT: 41.4 % (ref 39.0–52.0)
Hemoglobin: 12.3 g/dL — ABNORMAL LOW (ref 13.0–17.0)
Immature Granulocytes: 0 %
Lymphocytes Relative: 13 %
Lymphs Abs: 1.2 10*3/uL (ref 0.7–4.0)
MCH: 23.9 pg — ABNORMAL LOW (ref 26.0–34.0)
MCHC: 29.7 g/dL — ABNORMAL LOW (ref 30.0–36.0)
MCV: 80.5 fL (ref 80.0–100.0)
Monocytes Absolute: 0.7 10*3/uL (ref 0.1–1.0)
Monocytes Relative: 8 %
Neutro Abs: 7.3 10*3/uL (ref 1.7–7.7)
Neutrophils Relative %: 78 %
Platelets: 187 10*3/uL (ref 150–400)
RBC: 5.14 MIL/uL (ref 4.22–5.81)
RDW: 18.4 % — ABNORMAL HIGH (ref 11.5–15.5)
WBC: 9.4 10*3/uL (ref 4.0–10.5)
nRBC: 0 % (ref 0.0–0.2)

## 2021-02-17 LAB — PROCALCITONIN: Procalcitonin: 0.34 ng/mL

## 2021-02-17 MED ORDER — FREE WATER
150.0000 mL | Freq: Four times a day (QID) | Status: DC
  Administered 2021-02-17 – 2021-02-25 (×32): 150 mL

## 2021-02-17 NOTE — TOC Progression Note (Addendum)
Transition of Care [facility]) - Progression Note    Patient Details  Name: MILUS FRITZE MRN: [id number] Date of Birth: 1965/04/05  Transition of [facility] CM/SW Contact  [name], RN Phone Number: 02/17/2021, 11:40 AM  Clinical Narrative:    CM called and spoke with Domingo Madeira, CM at [facility] in [place], [place] and the facility is currently reviewing the patient's clinicals for admission to their LTC facility.  The patient is currently covered under [place] for insurance coverage but patient will likely needs LTC admission to facility with tracheostomy care and complex health issues.  The facility was given updated clinicals and copy updated insurance information.  CM called and left a message with the patient's son, Osmani Kersten regarding updated LTC placement opportunities.  CM and MSW waiting to hear back from [facility] facility as well.  CM and MSW with DTP Team continue to follow the patient for LTC placement at skilled nursing facility.   Expected Discharge Plan: Skilled Nursing Facility Barriers to Discharge: Continued Medical Work up  Expected Discharge Plan and Services Expected Discharge Plan: Skilled Nursing Facility   Discharge Planning Services: CM Consult Post Acute Care Choice: Skilled Nursing Facility Living arrangements for the past 2 months: Single Family Home                                       Social Determinants of Health (SDOH) Interventions    Readmission Risk Interventions No flowsheet data found.

## 2021-02-17 NOTE — Progress Notes (Addendum)
[facility] PROGRESS NOTE  Massie Mees Mesick OTL:[id number] DOB: 1965/11/06 DOA: 12/30/2020 PCP: Patient, [name]   01/29/2021    01/29/2021                 02/04/2020  Status: Remains inpatient appropriate because:  Unsafe discharge plan.  Patient in persistent vegetative state per stroke needs placement at skilled nursing facility that is trach capable-medically stable for discharge noting trach has been in place now for 30 days  Barriers to discharge: Social: Current insurance coverage about to expire and he otherwise has no funding for long-term care.  Financial services clarifying regarding eligibility for disability and Medicaid  Clinical: Tracheostomy and PEG dependent with recurrent tracheobronchitis; remains a full code  Level of care:  Med-Surg    Code Status: Full code Family Communication: 1/13 called son [name].  HIPAA appropriate voicemail left DVT prophylaxis: SCDs COVID vaccination status: Unknown  Foley catheter: 33 French inserted on 12/17; changed on 12/24 POA: No  HPI: 56 y.o. male with PMH significant for DM2, HTN, CKD with questionable compliance to medications Patient presented to the ED on 12/30/2020 with complaint of headache, altered mental status   In the ED, he was hypertensive to 238/160, agitated, required restraints CT head showed large amount of intraventricular hemorrhage involving lateral/third/fourth ventricle, ICH and left basal ganglia as well as chronic microvascular ischemic changes. He was started on Cleviprex drip and admitted to neuro ICU   He subsequently had CT head repeated on 11/30, 12/1 and 12/3, all demonstrated unchanged intraparenchymal hemorrhage and intraventricular hemorrhage 12/3, MRI brain from also showed numerous small acute infarcts in both cerebral hemisphere with minimal improvement in the right superior cerebellum 12/3, intubated 12/13, tracheostomy 12/15, wean from vent to trach collar 12/16,  PEG tube placement 12/16, repeat MRI showed an increase in the size and number of acute infarcts in the bilateral hemispheres with increased involvement in the cerebellum bilaterally.   12/18, transferred from neuro service to [facility]  12/21, palliative care consulted.  Family wanted to continue aggressive care as full CODE STATUS. 12/23 developed Cheyne-Stokes respiratory pattern with respiratory alkalosis.  Transferred to ICU for short-term mechanical ventilation and was found to have new tracheobronchitis now on cefepime 12/25 back to progressive floor and to [facility]   His hospital course has been complicated by significant neurological impairment, aspiration events, AKI, hypernatremia, urinary retention. 1/9 trach changed to #6.0 Shiley cuffless--1/10 > 30 days since trach inserted.  Subjective: Opens eyes to voice and stimulation-uncertain if actually blinks on command when requested  Objective: Vitals:   02/17/21 0325 02/17/21 0753  BP: (!) 143/92 (!) 151/90  Pulse: 97 97  Resp: (!) 21 16  Temp: 98.7 F (37.1 C) 98.5 F (36.9 C)  SpO2: 95% 98%    Intake/Output Summary (Last 24 hours) at 02/17/2021 0756 Last data filed at 02/17/2021 9741 Gross per 24 hour  Intake 3349 ml  Output 1700 ml  Net 1649 ml   Filed Weights   02/06/21 0500 02/07/21 0500 02/09/21 0500  Weight: 80.1 kg 80.2 kg 80.1 kg    Exam:  Constitutional: Unresponsive, NAD Respiratory: #6.0 Shiley cuffless trach,  Lung sounds more coarse but clear without rhonchi today, trach dressing with thick tan secretions .  Normal respiratory pattern today Cardiovascular: S1-S2, NSR, essentially normotensive and previous tachycardia has resolved, no bilateral lower extremity edema  Abdomen:  Abdomen soft and nontender.  PEG tube -normoactive bowels -rectal Foley in place  Neurologic: Upper extremities are  flaccid to PROM.-No further hypertonicity of lower extremities elicited with PROM.  Psychiatric: Eyes open but appears to be  unresponsive, not tracking, uncertain if blink eyes upon request today   Assessment/Plan: Acute problems: Brain injury 2/2 acute intraparenchymal and intraventricular hemorrhage/Bilateral multifocal ischemic infarcts  -Follow-up MRI on 12/16 demonstrated increase in size and number of acute infarcts  -Neurology signed off -currently not on blood thinners because of intracranial hemorrhage. -Has intermittent episodes of K[place]malls and Cheyne-Stokes respiratory patterns alternating with normal respiratory pattern -Continue baclofen for hypertonicity; continue scheduled Oxy IR for suspected underlying pain  - continue Symmetrel and Zyprexa  EOL discussion -per PMT 1/8 family requests that current level of care be continued including full CODE STATUS  Acute respiratory failure with hypoxia 2/2 recurrent Serratia tracheobronchitis (recent MSSA and serratia pneumonia; recent Klebsiella tracheobronchitis); -Completed Zosyn on 12/21 -12/23 recurrent acute tracheobronchitis-completed 7 days of Cefepime as rec by PCCM based on cxs -Continue scopolamine for excessive oral secretions to help minimize risk for aspiration -1/16 tracheal aspirate recultured-preliminary results:ABUNDANT GRAM POSITIVE COCCI MODERATE GRAM NEGATIVE RODS MODERATE GRAM POSITIVE RODS; TM 100.3 in the past 24 hours.  WBC normal with no left shift.  Stage II perirectal pressure wound (resolved) -Consider discontinuation of rectal tube once volume of liquid stool has decreased -Scheduled MiraLAX recently changed to prn any significant change in stool consistency -Continue Neutrisource fiber pack  Bilateral lower extremity edema -Resolved after solitary dose of IV Lasix  Essential hypertension/EKG with LVH strain -Blood pressure on admission was elevated to over 370 systolic. -Continue max dose Coreg, hydralazine and clonidine -BP poorly controlled in the evenings and overnight so will adjust clonidine from 0.1 mg TID to 0.2 mg  BID and add Norvasc 2.5 mg at HS -could be from hypertonicity and ? Awakening? so will increase Baclofen   AKI on CKD 3b/acute azotemia with mild hypernatremia -Baseline renal function: January 2022 BUN was 18 and creatinine 1.49 -Current creatinine 1.18 with a BUN of 37 -1/17 sodium 147 and BUN 61 therefore will need to increase free water to 150 cc every 6 hours   Non obstructive transaminitis -Recurrent issue throughout hospitalization -CT abdomen and pelvis 12/23 unremarkable no evidence of cirrhosis or other biliary tree issues -LFTs improved with mild elevation in ALT of 49 as of 1/9 -Hepatitis panel unremarkable  Type 2 diabetes mellitus -A1c 6.3 in 12/31/2020 -Continue Levemir 20 units BID along with SSI every 4 hours -CBGs well controlled   Protein calorie malnutrition/dysphagia Nutrition Problem: Inadequate oral intake Etiology: lethargy/confusion, dysphagia Signs/Symptoms: NPO status Interventions: Tube feeding via PEG Body mass index is 23.95 kg/m.    Acute urinary retention  -Continue Urecholine -Foley catheter discontinued on 1/11 and patient voiding easily      Data Reviewed: Basic Metabolic Panel: Recent Labs  Lab 02/14/21 0352  NA 145  K 4.1  CL 108  CO2 27  GLUCOSE 136*  BUN 50*  CREATININE 1.01  CALCIUM 9.6    CBC: Recent Labs  Lab 02/14/21 0352  WBC 7.3  NEUTROABS 4.8  HGB 10.8*  HCT 34.5*  MCV 78.1*  PLT 196     CBG: Recent Labs  Lab 02/16/21 1553 02/16/21 2035 02/16/21 2322 02/17/21 0406 02/17/21 0752  GLUCAP 215* 169* 195* 176* 177*      Scheduled Meds:  amantadine  100 mg Per Tube BID   amLODipine  2.5 mg Per Tube QHS   baclofen  10 mg Per Tube TID AC & HS   bethanechol  25 mg Per Tube TID   carvedilol  25 mg Per Tube BID WC   chlorhexidine gluconate (MEDLINE KIT)  15 mL Mouth Rinse BID   Chlorhexidine Gluconate Cloth  6 each Topical Q0600   cloNIDine  0.2 mg Per Tube BID   fiber  1 packet Per Tube BID   free  water  100 mL Per Tube Q6H   heparin injection (subcutaneous)  5,000 Units Subcutaneous Q8H   hydrALAZINE  100 mg Per Tube Q8H   insulin aspart  0-20 Units Subcutaneous Q4H   insulin detemir  20 Units Subcutaneous BID   mouth rinse  15 mL Mouth Rinse 10 times per day   nutrition supplement (JUVEN)  1 packet Per Tube BID BM   OLANZapine  2.5 mg Per Tube Daily   OLANZapine  5 mg Per Tube QHS   oxyCODONE  2.5 mg Per Tube Q8H   pantoprazole sodium  40 mg Per Tube Daily   scopolamine  1 patch Transdermal Q72H   Continuous Infusions:  feeding supplement (GLUCERNA 1.5 CAL) 1,000 mL (02/16/21 1709)    Principal Problem:   ICH (intracerebral hemorrhage) ([place]) Active Problems:   Malignant hypertension   Diabetes mellitus type 2 in obese ([place])   Acute respiratory failure ([place])   Tracheostomy dependence ([place])   Pneumonia due to Serratia marcescens ([place])   MSSA (methicillin susceptible Staphylococcus aureus) pneumonia ([place])   Stage 3b chronic kidney disease (CKD) ([place])   Acute kidney injury ([place])   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 ([place])   Acute hypernatremia   Dysphagia due to recent stroke   Acute urinary retention   Respiration abnormal   Pressure injury of skin   Abdominal distention   Intractable hiccups   Pneumonia due to Klebsiella pneumoniae (HCC)   Swollen lip   Fever   Muscular hypertonicity   Tracheobronchitis, chronic ([place])   Consultants: Neurology PCCM Medicine  Procedures: Echocardiogram EEG Cortrack PEG tube placement Tracheostomy tube placement  Antibiotics: Zosyn 12/15 through 12/21 Cefepime 12/24 through 12/27 Bactrim 12/27 >> 12/29 Cefepime 12/31 through 1/5   Time spent: 25 minutes    Erin Hearing ANP  [facility] 7 am - 330 pm/M-F for direct patient care and secure chat Please refer to Amion for contact info 49  days

## 2021-02-17 NOTE — Progress Notes (Addendum)
CSW spoke with Cassandra at [facility] who states she is willing to review referral - clinicals sent via secure e-mail for review.  [name], MSW, LCSW Transitions of Care   Clinical Social Worker II [phone]

## 2021-02-18 LAB — GLUCOSE, CAPILLARY
Glucose-Capillary: 152 mg/dL — ABNORMAL HIGH (ref 70–99)
Glucose-Capillary: 176 mg/dL — ABNORMAL HIGH (ref 70–99)
Glucose-Capillary: 176 mg/dL — ABNORMAL HIGH (ref 70–99)
Glucose-Capillary: 181 mg/dL — ABNORMAL HIGH (ref 70–99)
Glucose-Capillary: 189 mg/dL — ABNORMAL HIGH (ref 70–99)
Glucose-Capillary: 209 mg/dL — ABNORMAL HIGH (ref 70–99)
Glucose-Capillary: 216 mg/dL — ABNORMAL HIGH (ref 70–99)

## 2021-02-18 LAB — PROCALCITONIN: Procalcitonin: 0.24 ng/mL

## 2021-02-18 NOTE — TOC Progression Note (Signed)
Transition of Care [facility]) - Progression Note    Patient Details  Name: JADON HARBAUGH MRN: [id number] Date of Birth: 11-05-1965  [facility] CM/SW Contact  [name], RN Phone Number: 02/18/2021, 7:51 AM  Clinical Narrative:    Case management called and spoke with Dartha Lodge, RNCM with [facility] and discussed patient's need for SNF placement.  HIscock, RNCM provided list of SNF facilities in network with [facility] and clinicals were uploaded in the hub for potential bed offers.  The barrier for placement at this time includes patients complex care needing including tracheostomy and airway management.  I discussed with Hiscock, RNCM with the patient's clinicals were being reviewed by [facility] in [place], [place] and [facility] in [place], [place].  CM and MSW with [facility] will continue to review the patient for [facility] placement.   Expected Discharge Plan: Skilled Nursing Facility Barriers to Discharge: Continued Medical Work up  Expected Discharge Plan and Services Expected Discharge Plan: Skilled Nursing Facility   Discharge Planning Services: CM Consult Post Acute Care Choice: Skilled Nursing Facility Living arrangements for the past 2 months: Single Family Home                                       Social Determinants of Health (SDOH) Interventions    Readmission Risk Interventions No flowsheet data found.

## 2021-02-18 NOTE — Progress Notes (Signed)
[facility] PROGRESS NOTE  Stephen Delgado ZOX:[id number] DOB: 02/10/1965 DOA: 12/30/2020 PCP: Patient, [name]   01/29/2021    01/29/2021                 02/04/2020  Status: Remains inpatient appropriate because:  Unsafe discharge plan.  Patient in persistent vegetative state per stroke needs placement at skilled nursing facility that is trach capable-medically stable for discharge noting trach has been in place now for 30 days  Barriers to discharge: Social: Current insurance coverage about to expire and he otherwise has no funding for long-term care.  Financial services clarifying regarding eligibility for disability and Medicaid  Clinical: Tracheostomy and PEG dependent with recurrent tracheobronchitis; remains a full code  Level of care:  Med-Surg    Code Status: Full code Family Communication: 1/13 called son [name].  HIPAA appropriate voicemail left DVT prophylaxis: SCDs COVID vaccination status: Unknown  Foley catheter: 4 French inserted on 12/17; changed on 12/24 POA: No  HPI: 56 y.o. male with PMH significant for DM2, HTN, CKD with questionable compliance to medications Patient presented to the ED on 12/30/2020 with complaint of headache, altered mental status   In the ED, he was hypertensive to 238/160, agitated, required restraints CT head showed large amount of intraventricular hemorrhage involving lateral/third/fourth ventricle, ICH and left basal ganglia as well as chronic microvascular ischemic changes. He was started on Cleviprex drip and admitted to neuro ICU   He subsequently had CT head repeated on 11/30, 12/1 and 12/3, all demonstrated unchanged intraparenchymal hemorrhage and intraventricular hemorrhage 12/3, MRI brain from also showed numerous small acute infarcts in both cerebral hemisphere with minimal improvement in the right superior cerebellum 12/3, intubated 12/13, tracheostomy 12/15, wean from vent to trach collar 12/16,  PEG tube placement 12/16, repeat MRI showed an increase in the size and number of acute infarcts in the bilateral hemispheres with increased involvement in the cerebellum bilaterally.   12/18, transferred from neuro service to [facility]  12/21, palliative care consulted.  Family wanted to continue aggressive care as full CODE STATUS. 12/23 developed Cheyne-Stokes respiratory pattern with respiratory alkalosis.  Transferred to ICU for short-term mechanical ventilation and was found to have new tracheobronchitis now on cefepime 12/25 back to progressive floor and to [facility]   His hospital course has been complicated by significant neurological impairment, aspiration events, AKI, hypernatremia, urinary retention. 1/9 trach changed to #6.0 Shiley cuffless--1/10 > 30 days since trach inserted.  Subjective: Unresponsive  Objective: Vitals:   02/18/21 0317 02/18/21 0350  BP: 125/88 (!) 149/83  Pulse: 90 87  Resp: 12 16  Temp:  98.6 F (37 C)  SpO2: 100% 100%    Intake/Output Summary (Last 24 hours) at 02/18/2021 0751 Last data filed at 02/17/2021 1716 Gross per 24 hour  Intake 924 ml  Output 800 ml  Net 124 ml   Filed Weights   02/06/21 0500 02/07/21 0500 02/09/21 0500  Weight: 80.1 kg 80.2 kg 80.1 kg    Exam:  Constitutional: Unresponsive, NAD Respiratory: #6.0 Shiley cuffless trach,  Lung sounds more coarse but clear without rhonchi today, trach dressing with thick tan secretions .  Normal respiratory pattern today ENT: Crusted tan drainage near Left nares Cardiovascular: S1-S2, NSR, essentially normotensive and previous tachycardia has resolved, no bilateral lower extremity edema  Abdomen:  Abdomen soft and nontender.  PEG tube -normoactive bowels -rectal Foley in place  Neurologic: Upper extremities are flaccid to PROM.-No further hypertonicity of lower extremities elicited with  PROM.  Psychiatric: Unresponsive   Assessment/Plan: Acute problems: Brain injury 2/2 acute  intraparenchymal and intraventricular hemorrhage/Bilateral multifocal ischemic infarcts  -Follow-up MRI on 12/16 demonstrated increase in size and number of acute infarcts  -Neurology signed off -currently not on blood thinners because of intracranial hemorrhage. -Has intermittent episodes of [place] and Cheyne-Stokes respiratory patterns alternating with normal respiratory pattern -Continue baclofen for hypertonicity; continue scheduled Oxy IR for suspected underlying pain  - continue Symmetrel and Zyprexa  EOL discussion -per PMT 1/8 family requests that current level of care be continued including full CODE STATUS  Acute respiratory failure with hypoxia 2/2 recurrent Serratia tracheobronchitis (recent MSSA and serratia pneumonia; recent Klebsiella tracheobronchitis); -Completed Zosyn on 12/21 -12/23 recurrent acute tracheobronchitis-completed 7 days of Cefepime as rec by PCCM based on cxs -Continue scopolamine for excessive oral secretions to help minimize risk for aspiration -1/16 tracheal aspirate recultured-preliminary results:ABUNDANT GRAM POSITIVE COCCI MODERATE GRAM NEGATIVE RODS MODERATE GRAM POSITIVE RODS; TM 100.3 in the past 24 hours.  WBC normal with no left shift. -Ck CT sinuses to see if contributing to recurrent tracheobronchitis  Stage II perirectal pressure wound (resolved) -Consider discontinuation of rectal tube once volume of liquid stool has decreased -Scheduled MiraLAX recently changed to prn any significant change in stool consistency -Continue Neutrisource fiber pack  Bilateral lower extremity edema -Resolved after solitary dose of IV Lasix  Essential hypertension/EKG with LVH strain -Blood pressure on admission was elevated to over 259 systolic. -Continue max dose Coreg, hydralazine and clonidine -BP poorly controlled in the evenings and overnight so will adjust clonidine from 0.1 mg TID to 0.2 mg BID and add Norvasc 2.5 mg at HS -could be from hypertonicity  and ? Awakening? so will increase Baclofen   AKI on CKD 3b/acute azotemia with mild hypernatremia -Baseline renal function: January 2022 BUN was 18 and creatinine 1.49 -Current creatinine 1.18 with a BUN of 37 -1/17 sodium 147 and BUN 61 therefore will need to increase free water to 150 cc every 6 hours   Non obstructive transaminitis -Recurrent issue throughout hospitalization -CT abdomen and pelvis 12/23 unremarkable no evidence of cirrhosis or other biliary tree issues -LFTs improved with mild elevation in ALT of 49 as of 1/9 -Hepatitis panel unremarkable  Type 2 diabetes mellitus -A1c 6.3 in 12/31/2020 -Continue Levemir 20 units BID along with SSI every 4 hours -CBGs well controlled   Protein calorie malnutrition/dysphagia Nutrition Problem: Inadequate oral intake Etiology: lethargy/confusion, dysphagia Signs/Symptoms: NPO status Interventions: Tube feeding via PEG Body mass index is 23.95 kg/m.    Acute urinary retention  -Continue Urecholine -Foley catheter discontinued on 1/11 and patient voiding easily      Data Reviewed: Basic Metabolic Panel: Recent Labs  Lab 02/14/21 0352 02/17/21 0805  NA 145 147*  K 4.1 4.3  CL 108 109  CO2 27 25  GLUCOSE 136* 199*  BUN 50* 61*  CREATININE 1.01 1.17  CALCIUM 9.6 10.0    CBC: Recent Labs  Lab 02/14/21 0352 02/17/21 0805  WBC 7.3 9.4  NEUTROABS 4.8 7.3  HGB 10.8* 12.3*  HCT 34.5* 41.4  MCV 78.1* 80.5  PLT 196 187     CBG: Recent Labs  Lab 02/17/21 1228 02/17/21 1608 02/17/21 2026 02/18/21 0007 02/18/21 0400  GLUCAP 207* 192* 189* 209* 176*      Scheduled Meds:  amantadine  100 mg Per Tube BID   amLODipine  2.5 mg Per Tube QHS   baclofen  10 mg Per Tube TID AC & HS  bethanechol  25 mg Per Tube TID   carvedilol  25 mg Per Tube BID WC   chlorhexidine gluconate (MEDLINE KIT)  15 mL Mouth Rinse BID   Chlorhexidine Gluconate Cloth  6 each Topical Q0600   cloNIDine  0.2 mg Per Tube BID   fiber   1 packet Per Tube BID   free water  150 mL Per Tube Q6H   heparin injection (subcutaneous)  5,000 Units Subcutaneous Q8H   hydrALAZINE  100 mg Per Tube Q8H   insulin aspart  0-20 Units Subcutaneous Q4H   insulin detemir  20 Units Subcutaneous BID   mouth rinse  15 mL Mouth Rinse 10 times per day   nutrition supplement (JUVEN)  1 packet Per Tube BID BM   OLANZapine  2.5 mg Per Tube Daily   OLANZapine  5 mg Per Tube QHS   oxyCODONE  2.5 mg Per Tube Q8H   pantoprazole sodium  40 mg Per Tube Daily   scopolamine  1 patch Transdermal Q72H   Continuous Infusions:  feeding supplement (GLUCERNA 1.5 CAL) 1,000 mL (02/18/21 0427)    Principal Problem:   ICH (intracerebral hemorrhage) ([place]) Active Problems:   Malignant hypertension   Diabetes mellitus type 2 in obese ([place])   Acute respiratory failure ([place])   Tracheostomy dependence ([place])   Pneumonia due to Serratia marcescens ([place])   MSSA (methicillin susceptible Staphylococcus aureus) pneumonia ([place])   Stage 3b chronic kidney disease (CKD) ([place])   Acute kidney injury ([place])   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 ([place])   Acute hypernatremia   Dysphagia due to recent stroke   Acute urinary retention   Respiration abnormal   Pressure injury of skin   Abdominal distention   Intractable hiccups   Pneumonia due to Klebsiella pneumoniae (HCC)   Swollen lip   Fever   Muscular hypertonicity   Tracheobronchitis, chronic ([place])   Consultants: Neurology PCCM Medicine  Procedures: Echocardiogram EEG Cortrack PEG tube placement Tracheostomy tube placement  Antibiotics: Zosyn 12/15 through 12/21 Cefepime 12/24 through 12/27 Bactrim 12/27 >> 12/29 Cefepime 12/31 through 1/5   Time spent: 25 minutes    Erin Hearing ANP  [facility] 7 am - 330 pm/M-F for direct patient care and secure chat Please refer to Amion for contact info 50  days

## 2021-02-18 NOTE — Progress Notes (Signed)
Nutrition Follow-up  DOCUMENTATION CODES:   Not applicable  INTERVENTION:  Continue TF via PEG: Glucerna 1.5 @ 41m/hr (14449mday) Free water per MD, currently 1503mree water Q6H   Tube feeding regimen provides 2160 kcal, 118 grams of protein, and 1092 ml of H2O (1692m22mtal free water).  -1 packet Juven BID, each packet provides 95 calories, 2.5 grams of protein (collagen), and 9.8 grams of carbohydrate (3 grams sugar); also contains 7 grams of L-arginine and L-glutamine, 300 mg vitamin C, 15 mg vitamin E, 1.2 mcg vitamin B-12, 9.5 mg zinc, 200 mg calcium, and 1.5 g  Calcium Beta-hydroxy-Beta-methylbutyrate to support wound healing  NUTRITION DIAGNOSIS:   Inadequate oral intake related to lethargy/confusion, dysphagia as evidenced by NPO status.  ongoing  GOAL:   Patient will meet greater than or equal to 90% of their needs  Met with TF  MONITOR:   Diet advancement, Labs, Weight trends  REASON FOR ASSESSMENT:   Ventilator, Consult Enteral/tube feeding initiation and management  ASSESSMENT:   55 y62r old male who presented to the ED on 11/29 with AMS. PMH of HTN, T2DM, CKD stage III. 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  11/30 cortrak placed; tip gastric 12/2 trickle TF 12/3 pt intubated due to increased WOB, +emesis, TF held 12/4 trickle TF restarted 12/5 advancement orders placed 12/9 TF adjusted 12/13 s/p tracheostomy 12/16 s/p EGD and PEG placement 12/19 tx out of ICU to [facility]/23 developed Cheyne-Stokes respiratory pattern with respiratory alkalosis.Transferred to ICU for short-term mechanical ventilation and was found to have new tracheobronchitis now on cefepime 12/25 tx back to [facility]9 trach changed to #6 shiley cuffless   Pt continues to be in unresponsive, vegetative state. Continues to require TF via PEG and tolerating w/o issue.  Per MD, pt is medically stable for discharge noting  trach has been in place for 30 days as of 1/10.   Current TF: Glucerna 1.5 @ 60ml50mw/ 150ml 27m water Q6H  UOP: 800ml x49mours I/O: -1828ml si9madmit  Medications: Scheduled Meds:  amantadine  100 mg Per Tube BID   amLODipine  2.5 mg Per Tube QHS   baclofen  10 mg Per Tube TID AC & HS   bethanechol  25 mg Per Tube TID   carvedilol  25 mg Per Tube BID WC   chlorhexidine gluconate (MEDLINE KIT)  15 mL Mouth Rinse BID   Chlorhexidine Gluconate Cloth  6 each Topical Q0600   cloNIDine  0.2 mg Per Tube BID   fiber  1 packet Per Tube BID   free water  150 mL Per Tube Q6H   heparin injection (subcutaneous)  5,000 Units Subcutaneous Q8H   hydrALAZINE  100 mg Per Tube Q8H   insulin aspart  0-20 Units Subcutaneous Q4H   insulin detemir  20 Units Subcutaneous BID   mouth rinse  15 mL Mouth Rinse 10 times per day   nutrition supplement (JUVEN)  1 packet Per Tube BID BM   OLANZapine  2.5 mg Per Tube Daily   OLANZapine  5 mg Per Tube QHS   oxyCODONE  2.5 mg Per Tube Q8H   pantoprazole sodium  40 mg Per Tube Daily   scopolamine  1 patch Transdermal Q72H  Continuous Infusions:  feeding supplement (GLUCERNA 1.5 CAL) 1,000 mL (02/18/21 0427)   Labs: Recent Labs  Lab 02/14/21 0352 02/17/21 0805  NA 145 147*  K 4.1 4.3  CL 108 109  CO2 27 25  BUN 50* 61*  CREATININE 1.01 1.17  CALCIUM 9.6 10.0  GLUCOSE 136* 199*  CBGs: [phone]  Diet Order:   Diet Order             Diet NPO time specified  Diet effective midnight                   EDUCATION NEEDS:   Not appropriate for education at this time  Skin:  Skin Assessment: Skin Integrity Issues: Skin Integrity Issues:: Stage II Stage II: anus  Last BM:  1/17 via rectal tube  Height:   Ht Readings from Last 1 Encounters:  01/23/21 6' (1.829 m)    Weight:   Wt Readings from Last 1 Encounters:  02/09/21 80.1 kg   BMI:  Body mass index is 23.95 kg/m.  Estimated Nutritional Needs:   Kcal:   2150-2350  Protein:  105-120 grams  Fluid:  >2L     [name]., MS, RD, LDN (she/her/hers) RD pager number and weekend/on-call pager number located in [place].

## 2021-02-18 NOTE — Progress Notes (Signed)
Occupational Therapy Treatment Patient Details Name: Stephen Delgado MRN: [id number] DOB: 05/17/1965 Today's Date: 02/18/2021   History of present illness 56 y/o presented to [facility] ED on 11/29 for AMS, lethargy, and vomiting. Transferred to [facility]. CTH showed L caudate/BG hemorrhage with IVH. Declining respiratory status with snoring respirations. Intubated 12/3. Trach placed 12/13.on 12/16, repeat MRI showed an increase in the size and number of acute infarcts in the bilateral hemispheres with increased involvement in the cerebellum bilaterally. On 12/23, pt transferred to ICU for worsening respiratory status due to tracheobronchitis, requiring short term mechanical ventilation. PMH: HTN, sleep apnea, DM, CKD 3   OT comments  Stephen Delgado did not demonstrate a response to anxious stimuli this session. His eyes were open 25% of the session, did not blink to threat and no pupillary reaction to light. He sustained a L gaze, even with rolling. Pt is flaccid in all extremities with mild edema. No skin integrity impairment noted. PROM of extremities completed, full PROM available. OT care plan updated. Plan to assess pt's tolerance for sitting EOB as well as provide family education for safe positioning. OT to continue to follow acutely.    Recommendations for follow up therapy are one component of a multi-disciplinary discharge planning process, led by the attending physician.  Recommendations may be updated based on patient status, additional functional criteria and insurance authorization.    Follow Up Recommendations  Skilled nursing-short term rehab (<3 hours/day)    Assistance Recommended at Discharge Frequent or constant Supervision/Assistance  Patient can return home with the following  Other (comment) (total A for all aspects of care +air mattress hospital bed and hoyer.)   Equipment Recommendations  Other (comment)    Recommendations for Other Services Other (comment)    Precautions / Restrictions  Precautions Precautions: Fall Precaution Comments: G-tube, trach collar, flexiseal, foley, BUE/pedal edema Restrictions Weight Bearing Restrictions: No       Mobility Bed Mobility Overal bed mobility: Needs Assistance Bed Mobility: Rolling Rolling: Total assist              Transfers                   General transfer comment: not attempted - will required +2 skilled therapist and RN to manage lines         ADL either performed or assessed with clinical judgement   ADL Overall ADL's : Needs assistance/impaired           General ADL Comments: Pt continues to require total A for all aspects of care.    Extremity/Trunk Assessment Upper Extremity Assessment Upper Extremity Assessment: Difficult to assess due to impaired cognition (Flaccid. mild edema noted in BUEs. Full PROM.)   Lower Extremity Assessment Lower Extremity Assessment: Defer to PT evaluation        Vision   Vision Assessment?: Vision impaired- to be further tested in functional context Additional Comments: L gaze, even with rolling to the R. No blinking to threat. No pupillary response.   Perception Perception Perception: Not tested   Praxis Praxis Praxis: Not tested    Cognition Arousal/Alertness: Lethargic Behavior During Therapy: Flat affect Overall Cognitive Status: Difficult to assess         General Comments: patient did not respond to any stimuli during session - eye were open 25% of session, did not blink to treat & no pupillary response to light        Exercises Exercises: Other exercises, General Upper Extremity General Exercises - Upper  Extremity Shoulder Flexion: PROM, Both, 10 reps, Supine Shoulder Extension: PROM, 10 reps, Both, Supine Elbow Flexion: PROM, Both, 10 reps, Supine Elbow Extension: PROM, Both, 10 reps, Supine Wrist Flexion: PROM, Both, 10 reps, Supine Wrist Extension: PROM, Both, 10 reps, Supine Digit Composite Flexion: PROM, Both, 10 reps,  Supine Composite Extension: PROM, Both, Supine, 5 reps General Exercises - Lower Extremity Heel Slides: PROM, Both, 10 reps, Supine Other Exercises Other Exercises: retro grade message to BUE Other Exercises: BUE positioned to address edema       General Comments VSS on RA. No adverse signs or symptoms.    Pertinent Vitals/ Pain       Pain Assessment Pain Assessment: Faces Faces Pain Scale: No hurt Pain Location: no response to noxious stimuli Pain Intervention(s): Limited activity within patient's tolerance, Monitored during session   Frequency  Min 1X/week        Progress Toward Goals  OT Goals(current goals can now be found in the care plan section)  Progress towards OT goals: Goals drowngraded-see care plan  Acute Rehab OT Goals Patient Stated Goal: unable to state OT Goal Formulation: Patient unable to participate in goal setting Time For Goal Achievement: 03/04/21 Potential to Achieve Goals: Poor ADL Goals Additional ADL Goal #1: Pt will follow 1 step commands 25% of the time as a precursor for ADLs Additional ADL Goal #2: pt will tolerate sitting EOB for at least 10 minutes with max A +2 with no adverse signs or symptoms Additional ADL Goal #3: Pt's family will demonstrate appropriate pt positioning for skin integrity and edema control given verbal cues  Plan Discharge plan remains appropriate       AM-PAC OT "6 Clicks" Daily Activity     Outcome Measure   Help from another person eating meals?: Total Help from another person taking care of personal grooming?: Total Help from another person toileting, which includes using toliet, bedpan, or urinal?: Total Help from another person bathing (including washing, rinsing, drying)?: Total Help from another person to put on and taking off regular upper body clothing?: Total Help from another person to put on and taking off regular lower body clothing?: Total 6 Click Score: 6    End of Session Equipment Utilized  During Treatment: Oxygen  OT Visit Diagnosis: Other abnormalities of gait and mobility (R26.89);Muscle weakness (generalized) (M62.81);Low vision, both eyes (H54.2);Other symptoms and signs involving the nervous system (R29.898);Other symptoms and signs involving cognitive function;Cognitive communication deficit (R41.841) Symptoms and signs involving cognitive functions: Nontraumatic SAH;Other Nontraumatic ICH   Activity Tolerance Patient limited by lethargy   Patient Left in bed;with call bell/phone within reach;with bed alarm set   Nurse Communication Mobility status        Time: [id number] OT Time Calculation (min): 21 min  Charges: OT General Charges $OT Visit: 1 Visit OT Treatments $Therapeutic Exercise: 8-22 mins   Voncile Schwarz A Georgean Spainhower 02/18/2021, 3:45 PM

## 2021-02-19 ENCOUNTER — Inpatient Hospital Stay (HOSPITAL_COMMUNITY): Payer: 59

## 2021-02-19 DIAGNOSIS — B9689 Other specified bacterial agents as the cause of diseases classified elsewhere: Secondary | ICD-10-CM | POA: Insufficient documentation

## 2021-02-19 DIAGNOSIS — I610 Nontraumatic intracerebral hemorrhage in hemisphere, subcortical: Secondary | ICD-10-CM | POA: Diagnosis not present

## 2021-02-19 DIAGNOSIS — J019 Acute sinusitis, unspecified: Secondary | ICD-10-CM | POA: Insufficient documentation

## 2021-02-19 LAB — BASIC METABOLIC PANEL
Anion gap: 12 (ref 5–15)
BUN: 63 mg/dL — ABNORMAL HIGH (ref 6–20)
CO2: 25 mmol/L (ref 22–32)
Calcium: 9.6 mg/dL (ref 8.9–10.3)
Chloride: 110 mmol/L (ref 98–111)
Creatinine, Ser: 1.06 mg/dL (ref 0.61–1.24)
GFR, Estimated: >60 mL/min (ref >60)
Glucose, Bld: 150 mg/dL — ABNORMAL HIGH (ref 70–99)
Potassium: 4.2 mmol/L (ref 3.5–5.1)
Sodium: 147 mmol/L — ABNORMAL HIGH (ref 135–145)

## 2021-02-19 LAB — GLUCOSE, CAPILLARY
Glucose-Capillary: 155 mg/dL — ABNORMAL HIGH (ref 70–99)
Glucose-Capillary: 148 mg/dL — ABNORMAL HIGH (ref 70–99)
Glucose-Capillary: 167 mg/dL — ABNORMAL HIGH (ref 70–99)
Glucose-Capillary: 192 mg/dL — ABNORMAL HIGH (ref 70–99)
Glucose-Capillary: 204 mg/dL — ABNORMAL HIGH (ref 70–99)
Glucose-Capillary: 238 mg/dL — ABNORMAL HIGH (ref 70–99)

## 2021-02-19 LAB — CULTURE, RESPIRATORY W GRAM STAIN

## 2021-02-19 LAB — PROCALCITONIN: Procalcitonin: 0.28 ng/mL

## 2021-02-19 MED ORDER — IOHEXOL 300 MG/ML  SOLN
75.0000 mL | Freq: Once | INTRAMUSCULAR | Status: AC | PRN
  Administered 2021-02-19: 75 mL via INTRAVENOUS

## 2021-02-19 NOTE — Consult Note (Signed)
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 [facility] cource complicated by acute respiratory failure with MSSA pneumonia treated with  pip-tazo->cefazolin x 7 days, then MSSA + serratia pneumonia treated with pip-tazo x 7 days(EOT 12/21). Found to have tracheobronchitis with Resp Cx+ Serratia and Klebsiella(12/24), treated with almost 2 week of antibiotics, ending on 02/05/21. ID engaged per thick secretion from tracheal aspiration.   #Hx of Serratia marcescens and Klebsiella pneumonia SP almost 2 weeks of antibiotics -CT maxillofacial on 02/19/21  showed rhinosinusitis in left maxillary sinus, mild b/l mastoid effusions, carious left mandible dentition -At bedside received report of  thick secretion retrieved from aspiration of mouth and trach. -Pt has remained afebrile without increase in O2 demand. He had been treated  with cefepime x 11 days and bactrim x2 days till 02/05/21(13  days total). Ordered CXR on 02/19/21 which did not show focal consolidation. Thick secretions by themselves are not an indication of active infection and may continue during his recovery. Plan to hold antibiotics.  Recommendations:  -Hold antibiotics -Consider ENT consult per rhinosinusitis -ID will sign off.  Microbiology:   Antibiotics: 12/4-12/5,12/15-12/21 pip-tazo 12/5-12/10 cefazolin 12/24-12/27, 12/30-1/5 cefepime Bactrim 12/27-12/29 Cultures:  Other 12/3 Endotracheal Resp Cx+ MSSA 12/15 tracheal asp Serratia marcescens and MSSA 12/24 Tracheal asp serratia marcescens and klebsiella pneumoniae 12/30 Tracheal asp serratia marcescens and klebsiella pneumoniae  HPI: Stephen Delgado is a 56 y.o. male HTN, with non-adherence on medication DM A1C 6.3 on 12/31/20, CKD Stage III admitted for subcortical ICH. Pt started having headaches on Monday 12/29/21. Difficult to arouse the next day with continued HA,N,V. CT on admission showed left caudate/basal ganglia hemorrhage. Started on cleviprex drip and admitted to neuro ICU. Intubated on 12/3 and underwent MRI which showed numerous small acute infarcts.  Developed respiratory failure and found to have MSSA pneumonia (tracheal asp on 12/3) treated with pip-tazo->cefazolin x 7 days.   Underwent trach on 12/13, PEG on 12/16. Tracheal aspirate(12/15) collected as pt continued to spike fevers which grew serratia and MSSA , pt treated with pip-tazo x 7 days. Found to have tracheobronchitis on 12/23 with aspirate Cx + Serratia and klebsiella pt received cefepime for about 2 weeks( 11 days of cefazolin and bactrim x2days). ID engaged as pt continued to have thick secretion on aspiration. CT showed rhinosinusitis. Today, pt is resting in bed unable to provide history.    Review  of Systems: Review of Systems  Unable to perform ROS: Critical illness   Past Medical History:  Diagnosis Date   Acute respiratory failure ([place])    a. 12/2020 in setting of  IVH/strokes-->s/p trach.   Allergy    Bilateral Embolic strokes ([place])    a. 01/2021 MRI: numerous sm acute infarcts in bilat cerebral hemispheres, Lt caudate hemorrhage, IVH, scattered sulcal SAH, mild communicating hydrocephalus.   CKD (chronic kidney disease), stage III (HCC)    Diastolic dysfunction    a. 04/2015 Echo: EF 50-55%, mod LVH, no rwma, GrI DD, triv AI, + ASD; b. 12/2020 Echo: EF 50-55%, no rwma, grIDD, nl RV fxn, triv MR, mild AI, Asc Ao 84m.   Hypertension    Intraventricular hemorrhage ([place]    a. 12/2020 Aucte L basal ganglia intraparenchymal hemorrhage w/ IVH in the setting of hypertensive emergency.   Type II diabetes mellitus (HCC)     Social History   Tobacco Use   Smoking status: Never   Smokeless tobacco: Never  Substance Use Topics   Alcohol use: Yes    Alcohol/week: 0.0 standard drinks    Comment: Rarely.   Drug use: No    Family History  Problem Relation Age of Onset   Stroke Paternal Uncle    Scheduled Meds:  amantadine  100 mg Per Tube BID   amLODipine  2.5 mg Per Tube QHS   baclofen  10 mg Per Tube TID AC & HS   bethanechol  25 mg Per Tube TID   carvedilol  25 mg Per Tube BID WC   chlorhexidine gluconate (MEDLINE KIT)  15 mL Mouth Rinse BID   Chlorhexidine Gluconate Cloth  6 each Topical Q0600   cloNIDine  0.2 mg Per Tube BID   fiber  1 packet Per Tube BID   free water  150 mL Per Tube Q6H   heparin injection (subcutaneous)  5,000 Units Subcutaneous Q8H   hydrALAZINE  100 mg Per Tube Q8H   insulin aspart  0-20 Units Subcutaneous Q4H   insulin detemir  20 Units Subcutaneous BID   mouth rinse  15 mL Mouth Rinse 10 times per day   nutrition supplement (JUVEN)  1 packet Per Tube BID BM   OLANZapine  2.5 mg Per Tube Daily   OLANZapine  5 mg Per Tube QHS   oxyCODONE  2.5 mg Per Tube Q8H   pantoprazole sodium  40 mg Per Tube Daily   scopolamine  1 patch Transdermal Q72H   Continuous Infusions:  feeding supplement (GLUCERNA 1.5 CAL) 1,000 mL  (02/18/21 0427)   PRN Meds:.acetaminophen **OR** acetaminophen (TYLENOL) oral liquid 160 mg/5 mL **OR** acetaminophen, albuterol, docusate, guaiFENesin, hydrALAZINE, influenza vac split quadrivalent PF, labetalol, ondansetron (ZOFRAN) IV, polyethylene glycol No Known Allergies  OBJECTIVE: Blood pressure (!) (P) 149/87, pulse (P) 92, temperature (P) 98.3 F (36.8 C), temperature source (P) Axillary, resp. rate (P) 15, height 6' (1.829 m), weight 80.1 kg, SpO2 95 %.  Physical Exam Constitutional:      General: He is not in acute distress.    Appearance: He is normal weight. He is not toxic-appearing.     Comments: Non-verbal  HENT:     Head: Normocephalic and atraumatic.     Right Ear: External ear normal.     Left Ear: External ear normal.     Nose: No congestion or rhinorrhea.     Mouth/Throat:     Mouth: Mucous membranes are moist.     Pharynx: Oropharynx  is clear.  Eyes:     Extraocular Movements: Extraocular movements intact.     Conjunctiva/sclera: Conjunctivae normal.     Pupils: Pupils are equal, round, and reactive to light.  Neck:     Comments: Trach  Cardiovascular:     Rate and Rhythm: Normal rate and regular rhythm.     Heart sounds: No murmur heard.   No friction rub. No gallop.  Pulmonary:     Effort: Pulmonary effort is normal.     Breath sounds: Normal breath sounds.  Abdominal:     General: Abdomen is flat. Bowel sounds are normal.     Palpations: Abdomen is soft.  Musculoskeletal:        General: No swelling.     Cervical back: Neck supple.  Skin:    General: Skin is dry.  Psychiatric:        Mood and Affect: Mood normal.    Lab Results Lab Results  Component Value Date   WBC 9.4 02/17/2021   HGB 12.3 (L) 02/17/2021   HCT 41.4 02/17/2021   MCV 80.5 02/17/2021   PLT 187 02/17/2021    Lab Results  Component Value Date   CREATININE 1.06 02/19/2021   BUN 63 (H) 02/19/2021   NA 147 (H) 02/19/2021   K 4.2 02/19/2021   CL 110 02/19/2021   CO2  25 02/19/2021    Lab Results  Component Value Date   ALT 63 (H) 02/17/2021   AST 31 02/17/2021   ALKPHOS 57 02/17/2021   BILITOT 0.6 02/17/2021       Laurice Record, [facility] 02/19/2021, 10:27 AM

## 2021-02-19 NOTE — Progress Notes (Signed)
[facility] PROGRESS NOTE  Keo Schirmer Collister WNI:[id number] DOB: 1965/03/12 DOA: 12/30/2020 PCP: Patient, [name]   01/29/2021    01/29/2021                 02/04/2020  Status: Remains inpatient appropriate because:  Unsafe discharge plan.  Patient in persistent vegetative state per stroke needs placement at skilled nursing facility that is trach capable-medically stable for discharge noting trach has been in place now for 30 days  Barriers to discharge: Social: Current insurance coverage about to expire and he otherwise has no funding for long-term care.  Financial services clarifying regarding eligibility for disability and Medicaid  Clinical: Tracheostomy and PEG dependent with recurrent tracheobronchitis; remains a full code  Level of care:  Med-Surg    Code Status: Full code Family Communication: 1/13 called son [name].  HIPAA appropriate voicemail left DVT prophylaxis: SCDs COVID vaccination status: Unknown  Foley catheter: 52 French inserted on 12/17; changed on 12/24 POA: No  HPI: 56 y.o. male with PMH significant for DM2, HTN, CKD with questionable compliance to medications Patient presented to the ED on 12/30/2020 with complaint of headache, altered mental status   In the ED, he was hypertensive to 238/160, agitated, required restraints CT head showed large amount of intraventricular hemorrhage involving lateral/third/fourth ventricle, ICH and left basal ganglia as well as chronic microvascular ischemic changes. He was started on Cleviprex drip and admitted to neuro ICU   He subsequently had CT head repeated on 11/30, 12/1 and 12/3, all demonstrated unchanged intraparenchymal hemorrhage and intraventricular hemorrhage 12/3, MRI brain from also showed numerous small acute infarcts in both cerebral hemisphere with minimal improvement in the right superior cerebellum 12/3, intubated 12/13, tracheostomy 12/15, wean from vent to trach collar 12/16,  PEG tube placement 12/16, repeat MRI showed an increase in the size and number of acute infarcts in the bilateral hemispheres with increased involvement in the cerebellum bilaterally.   12/18, transferred from neuro service to [facility]  12/21, palliative care consulted.  Family wanted to continue aggressive care as full CODE STATUS. 12/23 developed Cheyne-Stokes respiratory pattern with respiratory alkalosis.  Transferred to ICU for short-term mechanical ventilation and was found to have new tracheobronchitis now on cefepime 12/25 back to progressive floor and to [facility]   His hospital course has been complicated by significant neurological impairment, aspiration events, AKI, hypernatremia, urinary retention. 1/9 trach changed to #6.0 Shiley cuffless--1/10 > 30 days since trach inserted.  Subjective: Unresponsive with Cheyne-Stokes respiratory pattern with apnea episodes lasting between 10 and 13 seconds  Objective: Vitals:   02/19/21 0700 02/19/21 0739  BP: 136/84   Pulse: 90   Resp:  12  Temp:    SpO2: 96%     Intake/Output Summary (Last 24 hours) at 02/19/2021 0804 Last data filed at 02/19/2021 3818 Gross per 24 hour  Intake 600 ml  Output 1850 ml  Net -1250 ml   Filed Weights   02/06/21 0500 02/07/21 0500 02/09/21 0500  Weight: 80.1 kg 80.2 kg 80.1 kg    Exam:  Constitutional: Unresponsive, NAD Respiratory: #6.0 Shiley cuffless trach,  Lung sounds more coarse but clear without rhonchi today, trach dressing with thick tan secretions .  Cheyne-Stokes respiratory pattern today Cardiovascular: S1-S2, NSR, essentially normotensive and previous tachycardia has resolved, no bilateral lower extremity edema  Abdomen:  Abdomen soft and nontender.  PEG tube -normoactive bowels -rectal Foley in place  Neurologic: Upper extremities are flaccid to PROM.-No further hypertonicity of lower extremities  elicited with PROM.  Psychiatric: Unresponsive   Assessment/Plan: Acute problems: Brain  injury 2/2 acute intraparenchymal and intraventricular hemorrhage/Bilateral multifocal ischemic infarcts  -Follow-up MRI on 12/16 demonstrated increase in size and number of acute infarcts  -Neurology signed off -currently not on blood thinners because of intracranial hemorrhage. -Has intermittent episodes of Kusmalls and Cheyne-Stokes respiratory patterns alternating with normal respiratory pattern -Continue baclofen for hypertonicity; continue scheduled Oxy IR for suspected underlying pain  - continue Symmetrel and Zyprexa  EOL discussion -per PMT 1/8 family requests that current level of care be continued including full CODE STATUS  Acute respiratory failure with hypoxia 2/2 recurrent Serratia tracheobronchitis (recent MSSA and serratia pneumonia; recent Klebsiella tracheobronchitis); -Completed Zosyn on 12/21 -12/23 recurrent acute tracheobronchitis-completed 7 days of Cefepime as rec by PCCM based on cxs -Continue scopolamine for excessive oral secretions to help minimize risk for aspiration -1/16 tracheal aspirate recultured-few Serratia sent to cefazolin, MODERATE STREPTOCOCCUS CONSTELLATUS (Beta hemolytic streptococci are predictably susceptible to penicillin and other beta lactams. Susceptibility testing not routinely performed. ) -Ck CT sinuses reveals rhinosinusitis worse on the left -Given recurrent issues now likely precipitated by sinus issues have asked ID for their input  Stage II perirectal pressure wound (resolved) -Consider discontinuation of rectal tube once volume of liquid stool has decreased -Scheduled MiraLAX recently changed to prn any significant change in stool consistency -Continue Neutrisource fiber pack  Bilateral lower extremity edema -Resolved after solitary dose of IV Lasix  Essential hypertension/EKG with LVH strain -Blood pressure on admission was elevated to over 759 systolic. -Continue max dose Coreg, hydralazine and clonidine -BP poorly controlled in  the evenings and overnight so will adjust clonidine from 0.1 mg TID to 0.2 mg BID and add Norvasc 2.5 mg at HS -could be from hypertonicity and ? Awakening? so will increase Baclofen   AKI on CKD 3b/acute azotemia with mild hypernatremia -Baseline renal function: January 2022 BUN was 18 and creatinine 1.49 -Current creatinine 1.18 with a BUN of 37 -1/17 sodium 147 and BUN 61 therefore will need to increase free water to 150 cc every 6 hours   Non obstructive transaminitis -Recurrent issue throughout hospitalization -CT abdomen and pelvis 12/23 unremarkable no evidence of cirrhosis or other biliary tree issues -LFTs improved with mild elevation in ALT of 49 as of 1/9 -Hepatitis panel unremarkable  Type 2 diabetes mellitus -A1c 6.3 in 12/31/2020 -Continue Levemir 20 units BID along with SSI every 4 hours -CBGs well controlled   Protein calorie malnutrition/dysphagia Nutrition Problem: Inadequate oral intake Etiology: lethargy/confusion, dysphagia Signs/Symptoms: NPO status Interventions: Tube feeding via PEG Body mass index is 23.95 kg/m.    Acute urinary retention  -Continue Urecholine -Foley catheter discontinued on 1/11 and patient voiding easily      Data Reviewed: Basic Metabolic Panel: Recent Labs  Lab 02/14/21 0352 02/17/21 0805 02/19/21 0341  NA 145 147* 147*  K 4.1 4.3 4.2  CL 108 109 110  CO2 _0 GLUCOSE 136* 199* 150*  BUN 50* 61* 63*  CREATININE 1.01 1.17 1.06  CALCIUM 9.6 10.0 9.6    CBC: Recent Labs  Lab 02/14/21 0352 02/17/21 0805  WBC 7.3 9.4  NEUTROABS 4.8 7.3  HGB 10.8* 12.3*  HCT 34.5* 41.4  MCV 78.1* 80.5  PLT 196 187     CBG: Recent Labs  Lab 02/18/21 1612 02/18/21 2135 02/18/21 2315 02/19/21 0330 02/19/21 0754  GLUCAP 176* 189* 181* 155* 148*      Scheduled Meds:  amantadine  100  mg Per Tube BID   amLODipine  2.5 mg Per Tube QHS   baclofen  10 mg Per Tube TID AC & HS   bethanechol  25 mg Per Tube TID    carvedilol  25 mg Per Tube BID WC   chlorhexidine gluconate (MEDLINE KIT)  15 mL Mouth Rinse BID   Chlorhexidine Gluconate Cloth  6 each Topical Q0600   cloNIDine  0.2 mg Per Tube BID   fiber  1 packet Per Tube BID   free water  150 mL Per Tube Q6H   heparin injection (subcutaneous)  5,000 Units Subcutaneous Q8H   hydrALAZINE  100 mg Per Tube Q8H   insulin aspart  0-20 Units Subcutaneous Q4H   insulin detemir  20 Units Subcutaneous BID   mouth rinse  15 mL Mouth Rinse 10 times per day   nutrition supplement (JUVEN)  1 packet Per Tube BID BM   OLANZapine  2.5 mg Per Tube Daily   OLANZapine  5 mg Per Tube QHS   oxyCODONE  2.5 mg Per Tube Q8H   pantoprazole sodium  40 mg Per Tube Daily   scopolamine  1 patch Transdermal Q72H   Continuous Infusions:  feeding supplement (GLUCERNA 1.5 CAL) 1,000 mL (02/18/21 0427)    Principal Problem:   ICH (intracerebral hemorrhage) ([place]) Active Problems:   Malignant hypertension   Diabetes mellitus type 2 in obese ([place])   Acute respiratory failure ([place])   Tracheostomy dependence ([place])   Pneumonia due to Serratia marcescens ([place])   MSSA (methicillin susceptible Staphylococcus aureus) pneumonia ([place])   Stage 3b chronic kidney disease (CKD) ([place])   Acute kidney injury ([place])   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 ([place])   Acute hypernatremia   Dysphagia due to recent stroke   Acute urinary retention   Respiration abnormal   Pressure injury of skin   Abdominal distention   Intractable hiccups   Pneumonia due to Klebsiella pneumoniae (HCC)   Swollen lip   Fever   Muscular hypertonicity   Tracheobronchitis, chronic ([place])   Consultants: Neurology PCCM Medicine  Procedures: Echocardiogram EEG Cortrack PEG tube placement Tracheostomy tube placement  Antibiotics: Zosyn 12/15 through 12/21 Cefepime 12/24 through 12/27 Bactrim 12/27 >> 12/29 Cefepime 12/31 through 1/5   Time spent: 25  minutes    Erin Hearing ANP  [facility] 7 am - 330 pm/M-F for direct patient care and secure chat Please refer to Amion for contact info 51  days

## 2021-02-20 LAB — GLUCOSE, CAPILLARY
Glucose-Capillary: 168 mg/dL — ABNORMAL HIGH (ref 70–99)
Glucose-Capillary: 174 mg/dL — ABNORMAL HIGH (ref 70–99)
Glucose-Capillary: 199 mg/dL — ABNORMAL HIGH (ref 70–99)
Glucose-Capillary: 231 mg/dL — ABNORMAL HIGH (ref 70–99)
Glucose-Capillary: 264 mg/dL — ABNORMAL HIGH (ref 70–99)
Glucose-Capillary: 275 mg/dL — ABNORMAL HIGH (ref 70–99)

## 2021-02-20 NOTE — Progress Notes (Signed)
CSW faxed patient's clinicals to [facility] in [place], [place] for review.  Madilyn Fireman, MSW, LCSW Transitions of Care   Clinical Social Worker II [phone]

## 2021-02-20 NOTE — Procedures (Signed)
Tracheostomy Change Note  Patient Details:   Name: MYLEN MANGAN DOB: 05/08/1965 MRN: [id number]    Airway Documentation:     Evaluation  O2 sats: stable throughout Complications: No apparent complications Patient did tolerate procedure well. Bilateral Breath Sounds: Rhonchi, Diminished Pt's trach changed per order. No complications.     Karey Stucki 02/20/2021, 1:59 PM

## 2021-02-20 NOTE — Progress Notes (Signed)
Palliative-   Chart reviewed. Patient discussed in team rounds. There is no role for Palliative at this time. We will sign off.   Thank you for this consult.   Ocie Bob, AGNP-C Palliative Medicine  No charge

## 2021-02-20 NOTE — TOC Progression Note (Addendum)
Transition of Care [facility]) - Progression Note    Patient Details  Name: Stephen Delgado MRN: [id number] Date of Birth: [birth year]  Transition of Care [facility]) CM/SW Contact  [name], RN Phone Number: 02/20/2021, 9:32 AM  Clinical Narrative:    CM and MSW with the patient wife, [name], and spoke with her in person about exploration of SNF facilities - including [facility] in [place], [place] and [address] in [place], [place].  No bed offers are available in the area at this time and patient may need placement outside of the area to support the patient's complex care needs including care for his tracheostomy and airway needs.  [name] is aware and is agreeable to discuss placement at facilities outside of the area if not other local facilities are available.  02/20/2021 - CM called and left a message with Domingo Madeira, CM at [facility] in [place], [place] to follow up regarding possible bed offer at the facility.  Patient's clinicals were faxed to [facility] in [place], [place] and [facility] in [place], [place].   Expected Discharge Plan: Skilled Nursing Facility Barriers to Discharge: Continued Medical Work up  Expected Discharge Plan and Services Expected Discharge Plan: Skilled Nursing Facility   Discharge Planning Services: CM Consult Post Acute Care Choice: Skilled Nursing Facility Living arrangements for the past 2 months: Single Family Home                                       Social Determinants of Health (SDOH) Interventions    Readmission Risk Interventions Readmission Risk Prevention Plan 02/20/2021  Transportation Screening Complete  Medication Review (RN Care Manager) Complete  PCP or Specialist appointment within 3-5 days of discharge Not Complete  PCP/Specialist Appt Not Complete comments Patient will need LTC placement at [facility] facility.  HRI or Home Care Consult Complete  SW Recovery  Care/Counseling Consult Complete  Palliative Care Screening Complete  Skilled Nursing Facility Complete  Some recent data might be hidden

## 2021-02-20 NOTE — Progress Notes (Signed)
[facility] °PROGRESS NOTE ° °Kai N Thomaston MRN:[id number] DOB: 09/03/1965 DOA: 12/30/2020 °PCP: Patient, [name] ° ° °01/29/2021 ° °  °01/29/2021                 02/04/2020 ° °Status: °Remains inpatient appropriate because:  °Unsafe discharge plan.  Patient in persistent vegetative state per stroke needs placement at skilled nursing facility that is trach capable-medically stable for discharge noting trach has been in place now for 30 days ° °Barriers to discharge: °Social: °Current insurance coverage about to expire and he otherwise has no funding for long-term care.  Financial services clarifying regarding eligibility for disability and Medicaid ° °Clinical: °Tracheostomy and PEG dependent with recurrent tracheobronchitis; remains a full code ° °Level of care:  °Med-Surg  ° ° °Code Status: Full code °Family Communication: 1/13 called son Joseph.  HIPAA appropriate voicemail left °DVT prophylaxis: SCDs °COVID vaccination status: Unknown ° °Foley catheter: 16 French inserted on 12/17; changed on 12/24 °POA: No ° °HPI: °56 y.o. male with PMH significant for DM2, HTN, CKD with questionable compliance to medications °Patient presented to the ED on 12/30/2020 with complaint of headache, altered mental status °  °In the ED, he was hypertensive to 238/160, agitated, required restraints °CT head showed large amount of intraventricular hemorrhage involving lateral/third/fourth ventricle, ICH and left basal ganglia as well as chronic microvascular ischemic changes. °He was started on Cleviprex drip and admitted to neuro ICU °  °He subsequently had CT head repeated on 11/30, 12/1 and 12/3, all demonstrated unchanged intraparenchymal hemorrhage and intraventricular hemorrhage °12/3, MRI brain from also showed numerous small acute infarcts in both cerebral hemisphere with minimal improvement in the right superior cerebellum °12/3, intubated °12/13, tracheostomy °12/15, wean from vent to trach collar °12/16,  PEG tube placement °12/16, repeat MRI showed an increase in the size and number of acute infarcts in the bilateral hemispheres with increased involvement in the cerebellum bilaterally.   °12/18, transferred from neuro service to [facility]  °12/21, palliative care consulted.  Family wanted to continue aggressive care as full CODE STATUS. °12/23 developed Cheyne-Stokes respiratory pattern with respiratory alkalosis.  Transferred to ICU for short-term mechanical ventilation and was found to have new tracheobronchitis now on cefepime °12/25 back to progressive floor and to [facility] °  °His hospital course has been complicated by significant neurological impairment, aspiration events, AKI, hypernatremia, urinary retention. °1/9 trach changed to #6.0 Shiley cuffless--1/10 > 30 days since trach inserted. ° °Subjective: °Upon entry into the room nurse giving patient a bath.  His eyes are open but he is overall unresponsive and not tracking.  Continues to have Cheyne-Stokes respiratory pattern ° °Objective: °Vitals:  ° 02/19/21 2330 02/20/21 0315  °BP: 131/75 (!) 164/90  °Pulse: 89 92  °Resp: 15 18  °Temp: 98.7 °F (37.1 °C) 99.5 °F (37.5 °C)  °SpO2: 97% 96%  ° ° °Intake/Output Summary (Last 24 hours) at 02/20/2021 0736 °Last data filed at 02/19/2021 2330 °Gross per 24 hour  °Intake 3044 ml  °Output 1150 ml  °Net 1894 ml  ° °Filed Weights  ° 02/07/21 0500 02/09/21 0500 02/20/21 0347  °Weight: 80.2 kg 80.1 kg 79.8 kg  ° ° °Exam: ° °Constitutional: Unresponsive, NAD-TM 101.6 axillary past 24 hrs °Respiratory: #6.0 Shiley cuffless trach,  Lung sounds more coarse but clear without rhonchi today, trach with thick tan secretions .  Cheyne-Stokes respiratory pattern today °Cardiovascular: S1-S2, NSR, essentially normotensive and previous tachycardia has resolved, no bilateral lower extremity edema  °Abdomen:    Abdomen soft and nontender.  PEG tube -normoactive bowels -rectal Foley in place  °Neurologic: Upper extremities are flaccid to PROM.-No  further hypertonicity of lower extremities elicited with PROM.  °Psychiatric: Unresponsive ° ° °Assessment/Plan: °Acute problems: °Brain injury 2/2 acute intraparenchymal and intraventricular hemorrhage/Bilateral multifocal ischemic infarcts  °-Follow-up MRI on 12/16 demonstrated increase in size and number of acute infarcts  °-Neurology signed off -currently not on blood thinners because of intracranial hemorrhage. °-Has intermittent episodes of K[place]malls and Cheyne-Stokes respiratory patterns alternating with normal respiratory pattern °-Continue baclofen for hypertonicity; continue scheduled Oxy IR for suspected underlying pain  °- continue Symmetrel and Zyprexa ° °EOL discussion °-per PMT 1/8 family requests that current level of care be continued including full CODE STATUS ° °Acute respiratory failure with hypoxia 2/2 recurrent Serratia tracheobronchitis (recent MSSA and serratia pneumonia; recent Klebsiella tracheobronchitis); °-Completed Zosyn on 12/21 °-12/23 recurrent acute tracheobronchitis-completed 7 days of Cefepime as rec by PCCM based on cxs °-Continue scopolamine for excessive oral secretions to help minimize risk for aspiration °-1/16 tracheal aspirate recultured-few Serratia sent to cefazolin, MODERATE STREPTOCOCCUS CONSTELLATUS (Beta hemolytic streptococci are predictably susceptible to penicillin and other beta lactams. Susceptibility testing not routinely performed. ) °-Ck CT sinuses reveals rhinosinusitis worse on the left °-ID has seen in consultation-no indication for antibiotics at this time but they are aware of temperature spike overnight recommend consult ENT and continue monitoring vital signs/fever °-ENT Dr. Skotnicki has been consulted as of 1/20 ° °Stage II perirectal pressure wound (resolved) °-Consider discontinuation of rectal tube once volume of liquid stool has decreased °-Scheduled MiraLAX recently changed to prn any significant change in stool consistency °-Continue Neutrisource  fiber pack ° °Bilateral lower extremity edema °-Resolved after solitary dose of IV Lasix ° °Essential hypertension/EKG with LVH strain °-Blood pressure on admission was elevated to over 200 systolic. °-Continue max dose Coreg, hydralazine and clonidine °-BP poorly controlled in the evenings and overnight so will adjust clonidine from 0.1 mg TID to 0.2 mg BID and add Norvasc 2.5 mg at HS °-could be from hypertonicity and ? Awakening? so will increase Baclofen °  °AKI on CKD 3b/acute azotemia with mild hypernatremia °-Baseline renal function: January 2022 BUN was 18 and creatinine 1.49 °-Current creatinine 1.18 with a BUN of 37 °-1/17 sodium 147 and BUN 61 therefore will need to increase free water to 150 cc every 6 hours °  °Non obstructive transaminitis °-Recurrent issue throughout hospitalization °-CT abdomen and pelvis 12/23 unremarkable no evidence of cirrhosis or other biliary tree issues °-LFTs improved with mild elevation in ALT of 49 as of 1/9 °-Hepatitis panel unremarkable ° °Type 2 diabetes mellitus °-A1c 6.3 in 12/31/2020 °-Continue Levemir 20 units BID along with SSI every 4 hours °-CBGs well controlled °  °Protein calorie malnutrition/dysphagia °Nutrition Problem: Inadequate oral intake °Etiology: lethargy/confusion, dysphagia °Signs/Symptoms: NPO status °Interventions: Tube feeding via PEG °Body mass index is 23.86 kg/m².  °  °Acute urinary retention  °-Continue Urecholine °-Foley catheter discontinued on 1/11 and patient voiding easily °  ° ° ° °Data Reviewed: °Basic Metabolic Panel: °Recent Labs  °Lab 02/14/21 °0352 02/17/21 °0805 02/19/21 °0341  °NA 145 147* 147*  °K 4.1 4.3 4.2  °CL 108 109 110  °CO2 27 25 25  °GLUCOSE 136* 199* 150*  °BUN 50* 61* 63*  °CREATININE 1.01 1.17 1.06  °CALCIUM 9.6 10.0 9.6  ° ° °CBC: °Recent Labs  °Lab 02/14/21 °0352 02/17/21 °0805  °WBC 7.3 9.4  °NEUTROABS 4.8 7.3  °HGB 10.8* 12.3*  °HCT   34.5* 41.4  MCV 78.1* 80.5  PLT 196 187     CBG: Recent Labs  Lab  02/19/21 1146 02/19/21 1527 02/19/21 1940 02/19/21 2329 02/20/21 0314  GLUCAP 167* 192* 204* 238* 168*      Scheduled Meds:  amantadine  100 mg Per Tube BID   amLODipine  2.5 mg Per Tube QHS   baclofen  10 mg Per Tube TID AC & HS   bethanechol  25 mg Per Tube TID   carvedilol  25 mg Per Tube BID WC   chlorhexidine gluconate (MEDLINE KIT)  15 mL Mouth Rinse BID   Chlorhexidine Gluconate Cloth  6 each Topical Q0600   cloNIDine  0.2 mg Per Tube BID   fiber  1 packet Per Tube BID   free water  150 mL Per Tube Q6H   heparin injection (subcutaneous)  5,000 Units Subcutaneous Q8H   hydrALAZINE  100 mg Per Tube Q8H   insulin aspart  0-20 Units Subcutaneous Q4H   insulin detemir  20 Units Subcutaneous BID   mouth rinse  15 mL Mouth Rinse 10 times per day   nutrition supplement (JUVEN)  1 packet Per Tube BID BM   OLANZapine  2.5 mg Per Tube Daily   OLANZapine  5 mg Per Tube QHS   oxyCODONE  2.5 mg Per Tube Q8H   pantoprazole sodium  40 mg Per Tube Daily   scopolamine  1 patch Transdermal Q72H   Continuous Infusions:  feeding supplement (GLUCERNA 1.5 CAL) 1,000 mL (02/19/21 1703)    Principal Problem:   ICH (intracerebral hemorrhage) (HCC) Active Problems:   Malignant hypertension   Diabetes mellitus type 2 in obese (HCC)   Acute respiratory failure (H[facility])   Tracheostomy dependence ([employer])   Pneumonia due to Serratia marcescens ([place])   MSSA (methicillin susceptible Staphylococcus aureus) pneumonia ([place])   Stage 3b chronic kidney disease (CKD) ([place])   Acute kidney injury ([place])   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 ([place])   Acute hypernatremia   Dysphagia due to recent stroke   Acute urinary retention   Respiration abnormal   Pressure injury of skin   Abdominal distention   Intractable hiccups   Pneumonia due to Klebsiella pneumoniae (HCC)   Swollen lip   Fever   Muscular hypertonicity   Tracheobronchitis, chronic ([place])   Acute bacterial  sinusitis   Consultants: Neurology PCCM Medicine  Procedures: Echocardiogram EEG Cortrack PEG tube placement Tracheostomy tube placement  Antibiotics: Zosyn 12/15 through 12/21 Cefepime 12/24 through 12/27 Bactrim 12/27 >> 12/29 Cefepime 12/31 through 1/5   Time spent: 25 minutes    Erin Hearing ANP  [facility] 7 am - 330 pm/M-F for direct patient care and secure chat Please refer to Amion for contact info 52  days

## 2021-02-21 LAB — CBC WITH DIFFERENTIAL/PLATELET
Abs Immature Granulocytes: 0.2 10*3/uL — ABNORMAL HIGH (ref 0.00–0.07)
Basophils Absolute: 0.1 10*3/uL (ref 0.0–0.1)
Basophils Relative: 1 %
Eosinophils Absolute: 0.5 10*3/uL (ref 0.0–0.5)
Eosinophils Relative: 4 %
HCT: 38.7 % — ABNORMAL LOW (ref 39.0–52.0)
Hemoglobin: 11.7 g/dL — ABNORMAL LOW (ref 13.0–17.0)
Lymphocytes Relative: 11 %
Lymphs Abs: 1.3 10*3/uL (ref 0.7–4.0)
MCH: 24 pg — ABNORMAL LOW (ref 26.0–34.0)
MCHC: 30.2 g/dL (ref 30.0–36.0)
MCV: 79.5 fL — ABNORMAL LOW (ref 80.0–100.0)
Metamyelocytes Relative: 1 %
Monocytes Absolute: 0.5 10*3/uL (ref 0.1–1.0)
Monocytes Relative: 4 %
Myelocytes: 1 %
Neutro Abs: 8.9 10*3/uL — ABNORMAL HIGH (ref 1.7–7.7)
Neutrophils Relative %: 78 %
Platelets: 214 10*3/uL (ref 150–400)
RBC: 4.87 MIL/uL (ref 4.22–5.81)
RDW: 17.4 % — ABNORMAL HIGH (ref 11.5–15.5)
WBC: 11.4 10*3/uL — ABNORMAL HIGH (ref 4.0–10.5)
nRBC: 0 % (ref 0.0–0.2)
nRBC: 0 /100 WBC

## 2021-02-21 LAB — COMPREHENSIVE METABOLIC PANEL
ALT: 130 U/L — ABNORMAL HIGH (ref 0–44)
AST: 36 U/L (ref 15–41)
Albumin: 2.5 g/dL — ABNORMAL LOW (ref 3.5–5.0)
Alkaline Phosphatase: 77 U/L (ref 38–126)
Anion gap: 9 (ref 5–15)
BUN: 58 mg/dL — ABNORMAL HIGH (ref 6–20)
CO2: 28 mmol/L (ref 22–32)
Calcium: 10.1 mg/dL (ref 8.9–10.3)
Chloride: 111 mmol/L (ref 98–111)
Creatinine, Ser: 1.05 mg/dL (ref 0.61–1.24)
GFR, Estimated: >60 mL/min (ref >60)
Glucose, Bld: 302 mg/dL — ABNORMAL HIGH (ref 70–99)
Potassium: 4.4 mmol/L (ref 3.5–5.1)
Sodium: 148 mmol/L — ABNORMAL HIGH (ref 135–145)
Total Bilirubin: 0.3 mg/dL (ref 0.3–1.2)
Total Protein: 7.8 g/dL (ref 6.5–8.1)

## 2021-02-21 LAB — GLUCOSE, CAPILLARY
Glucose-Capillary: 178 mg/dL — ABNORMAL HIGH (ref 70–99)
Glucose-Capillary: 179 mg/dL — ABNORMAL HIGH (ref 70–99)
Glucose-Capillary: 186 mg/dL — ABNORMAL HIGH (ref 70–99)
Glucose-Capillary: 200 mg/dL — ABNORMAL HIGH (ref 70–99)
Glucose-Capillary: 214 mg/dL — ABNORMAL HIGH (ref 70–99)
Glucose-Capillary: 271 mg/dL — ABNORMAL HIGH (ref 70–99)

## 2021-02-21 NOTE — Progress Notes (Signed)
[facility] PROGRESS NOTE  Savino Whisenant Dukes  ZOX:[id number] DOB: Apr 19, 1965 DOA: 12/30/2020 PCP: Patient, [name]  Brief Narrative: Soham Hollett Albornoz is a 56 y.o. male with a history of T2DM, HTN, CKD, limited medication adherence who presented to the ED 12/30/2020 with AMS, headache found to be hypertensive, agitated with large ICH. Cleviprex gtt initiated on admission to neuro ICU, ultimately required intubation 12/3, tracheostomy 12/13, PEG placement 12/16. Unfortunately, neuroimaging repeat 12/16 revealed increased size and quantity of bilateral infarcts. He appeared stable for transfer out of ICU but developed respiratory distress requiring brief mechanical ventilation due to tracheobronchitis. He was transferred back to medical floor 12/25 on cefepime and has remained relatively stable awaiting placement. Palliative care has been involved, though the patient's family has continued to desire all aggressive measures performed.   Subjective: Nonverbal, no overnight events per RN.  Objective: BP 134/84    Pulse 89    Temp 98.5 F (36.9 C) (Oral)    Resp 18    Ht 6' (1.829 m)    Wt 79.8 kg    SpO2 97%    BMI 23.86 kg/m   Gen: No acute distress Neck: Cuffless trach in place with minimal secretions at this time.  Pulm: Upper airway sounds without lower wheezes or crackles. Normal RR and effort. CV: RRR, no murmur, no JVD, no edema GU: Male purewick in place. GI: Soft, NT, ND, +BS. Rectal tube noted. PEG site c/d/i Neuro: Does not respond to verbal or tactile stimuli, pupils reactive but not tracking. Ext: Warm, dry  Assessment & Plan: 55yo gentleman admitted in late Nov 2022 suffering severe ICH, intraventricular hemorrhage and resultant profound debility. Now s/p trach and PEG with stable exam. Recent developments include fevers for which ID was consulted recommending holding antibiotics for now. ENT was consulted for rhinosinusitis (appears that this is pending). Pt has  remained afebrile over the past 24 hours with otherwise stable vital signs. Will continue monitoring closely, and continue with current plan of care otherwise. Discussed with RN this AM.  Tyrone Nine, MD [facility] [email or web address] 02/21/2021, 11:11 AM

## 2021-02-21 NOTE — Plan of Care (Signed)
?  Problem: Education: ?Goal: Knowledge of General Education information will improve ?Description: Including pain rating scale, medication(s)/side effects and non-pharmacologic comfort measures ?Outcome: Progressing ?  ?Problem: Health Behavior/Discharge Planning: ?Goal: Ability to manage health-related needs will improve ?Outcome: Progressing ?  ?Problem: Clinical Measurements: ?Goal: Ability to maintain clinical measurements within normal limits will improve ?Outcome: Progressing ?Goal: Will remain free from infection ?Outcome: Progressing ?Goal: Diagnostic test results will improve ?Outcome: Progressing ?Goal: Respiratory complications will improve ?Outcome: Progressing ?Goal: Cardiovascular complication will be avoided ?Outcome: Progressing ?  ?Problem: Activity: ?Goal: Risk for activity intolerance will decrease ?Outcome: Progressing ?  ?Problem: Nutrition: ?Goal: Adequate nutrition will be maintained ?Outcome: Progressing ?  ?Problem: Coping: ?Goal: Level of anxiety will decrease ?Outcome: Progressing ?  ?Problem: Elimination: ?Goal: Will not experience complications related to bowel motility ?Outcome: Progressing ?Goal: Will not experience complications related to urinary retention ?Outcome: Progressing ?  ?Problem: Pain Managment: ?Goal: General experience of comfort will improve ?Outcome: Progressing ?  ?Problem: Safety: ?Goal: Ability to remain free from injury will improve ?Outcome: Progressing ?  ?Problem: Skin Integrity: ?Goal: Risk for impaired skin integrity will decrease ?Outcome: Progressing ?  ?Problem: Education: ?Goal: Knowledge of disease or condition will improve ?Outcome: Progressing ?Goal: Knowledge of secondary prevention will improve (SELECT ALL) ?Outcome: Progressing ?Goal: Knowledge of patient specific risk factors will improve (INDIVIDUALIZE FOR PATIENT) ?Outcome: Progressing ?Goal: Individualized Educational Video(s) ?Outcome: Progressing ?  ?Problem: Coping: ?Goal: Will verbalize  positive feelings about self ?Outcome: Progressing ?Goal: Will identify appropriate support needs ?Outcome: Progressing ?  ?Problem: Health Behavior/Discharge Planning: ?Goal: Ability to manage health-related needs will improve ?Outcome: Progressing ?  ?Problem: Self-Care: ?Goal: Ability to participate in self-care as condition permits will improve ?Outcome: Progressing ?Goal: Verbalization of feelings and concerns over difficulty with self-care will improve ?Outcome: Progressing ?Goal: Ability to communicate needs accurately will improve ?Outcome: Progressing ?  ?Problem: Nutrition: ?Goal: Risk of aspiration will decrease ?Outcome: Progressing ?Goal: Dietary intake will improve ?Outcome: Progressing ?  ?Problem: Intracerebral Hemorrhage Tissue Perfusion: ?Goal: Complications of Intracerebral Hemorrhage will be minimized ?Outcome: Progressing ?  ?

## 2021-02-22 LAB — GLUCOSE, CAPILLARY
Glucose-Capillary: 160 mg/dL — ABNORMAL HIGH (ref 70–99)
Glucose-Capillary: 169 mg/dL — ABNORMAL HIGH (ref 70–99)
Glucose-Capillary: 185 mg/dL — ABNORMAL HIGH (ref 70–99)
Glucose-Capillary: 193 mg/dL — ABNORMAL HIGH (ref 70–99)
Glucose-Capillary: 184 mg/dL — ABNORMAL HIGH (ref 70–99)

## 2021-02-22 NOTE — Progress Notes (Signed)
[facility] PROGRESS NOTE  Kristion Holifield Lepp  RFF:[id number] DOB: [birth year] DOA: 12/30/2020 PCP: Patient, [name]  Brief Narrative: Donovon Micheletti Breuer is a 56 y.o. male with a history of T2DM, HTN, CKD, limited medication adherence who presented to the ED 12/30/2020 with AMS, headache found to be hypertensive, agitated with large ICH. Cleviprex gtt initiated on admission to neuro ICU, ultimately required intubation 12/3, tracheostomy 12/13, PEG placement 12/16. Unfortunately, neuroimaging repeat 12/16 revealed increased size and quantity of bilateral infarcts. He appeared stable for transfer out of ICU but developed respiratory distress requiring brief mechanical ventilation due to tracheobronchitis. He was transferred back to medical floor 12/25 on cefepime and has remained relatively stable awaiting placement. Palliative care has been involved, though the patient's family has continued to desire all aggressive measures performed.   Subjective: Nonverbal, not meaningfully responsive. No significant changes. Tmax/24hrs is 98.17F.   Objective: BP 133/90 (BP Location: Left Arm)    Pulse 94    Temp 97.7 F (36.5 C) (Axillary)    Resp 16    Ht 6' (1.829 m)    Wt 79.8 kg    SpO2 95%    BMI 23.86 kg/m   Gen: Chronically ill-appearing male unresponsive in no acute distress Pulm: Nonlabored thru cuffless trach without crackles or wheezes. Secretions thick but not voluminous.  CV: Regular rate and rhythm. No murmur, rub, or gallop. No JVD, no dependent edema. GI: Abdomen soft, non-tender, non-distended, with normoactive bowel sounds. PEG site c/d/i. Ext: Warm, no deformities Skin: No new rashes, lesions or ulcers on visualized skin. Neuro: Not meaningfully responsive.  Assessment & Plan: 55yo gentleman admitted in late Nov 2022 suffering severe ICH, intraventricular hemorrhage and resultant profound debility. Now s/p trach and PEG with stable exam.   Recent developments include fevers  for which ID was consulted recommending holding antibiotics for now. ENT was consulted for rhinosinusitis (appears that this is pending). Pt has remained afebrile over the past 48 hours with otherwise stable vital signs. Will continue monitoring closely, and continue with current plan of care otherwise. No family at bedside.  Tyrone Nine, MD [facility] [email or web address] 02/22/2021, 9:21 AM

## 2021-02-23 LAB — GLUCOSE, CAPILLARY
Glucose-Capillary: 165 mg/dL — ABNORMAL HIGH (ref 70–99)
Glucose-Capillary: 168 mg/dL — ABNORMAL HIGH (ref 70–99)
Glucose-Capillary: 175 mg/dL — ABNORMAL HIGH (ref 70–99)
Glucose-Capillary: 192 mg/dL — ABNORMAL HIGH (ref 70–99)
Glucose-Capillary: 203 mg/dL — ABNORMAL HIGH (ref 70–99)
Glucose-Capillary: 212 mg/dL — ABNORMAL HIGH (ref 70–99)

## 2021-02-23 MED ORDER — INSULIN DETEMIR 100 UNIT/ML ~~LOC~~ SOLN
24.0000 [IU] | Freq: Two times a day (BID) | SUBCUTANEOUS | Status: DC
  Administered 2021-02-23 – 2021-03-20 (×51): 24 [IU] via SUBCUTANEOUS
  Filled 2021-02-23 (×56): qty 0.24

## 2021-02-23 NOTE — Progress Notes (Signed)
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 rate 16, height 6' (1.829 m), weight 79.8 kg, SpO2 97 %.  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

## 2021-02-23 NOTE — Progress Notes (Signed)
Kept patient in comfortable position. No respiratory distress. Tolerating his feeding well.

## 2021-02-23 NOTE — Progress Notes (Addendum)
[facility] PROGRESS NOTE  Stephen Delgado GYI:[id number] DOB: 12-06-1965 DOA: 12/30/2020 PCP: Patient, [name]   01/29/2021    01/29/2021                 02/04/2020  Status: Remains inpatient appropriate because:  Unsafe discharge plan.  Patient in persistent vegetative state per stroke needs placement at skilled nursing facility that is trach capable-medically stable for discharge noting trach has been in place now for 30 days  Barriers to discharge: Social: Current insurance coverage about to expire and he otherwise has no funding for long-term care.  Financial services clarifying regarding eligibility for disability and Medicaid  Clinical: Tracheostomy and PEG dependent with recurrent tracheobronchitis; remains a full code  Level of care:  Med-Surg    Code Status: Full code Family Communication:1/23 wife Stephen Delgado at bedside DVT prophylaxis: SCDs COVID vaccination status: Unknown  Foley catheter: 3 French inserted on 12/17; changed on 12/24 POA: No  HPI: 56 y.o. male with PMH significant for DM2, HTN, CKD with questionable compliance to medications Patient presented to the ED on 12/30/2020 with complaint of headache, altered mental status   In the ED, he was hypertensive to 238/160, agitated, required restraints CT head showed large amount of intraventricular hemorrhage involving lateral/third/fourth ventricle, ICH and left basal ganglia as well as chronic microvascular ischemic changes. He was started on Cleviprex drip and admitted to neuro ICU   He subsequently had CT head repeated on 11/30, 12/1 and 12/3, all demonstrated unchanged intraparenchymal hemorrhage and intraventricular hemorrhage 12/3, MRI brain from also showed numerous small acute infarcts in both cerebral hemisphere with minimal improvement in the right superior cerebellum 12/3, intubated 12/13, tracheostomy 12/15, wean from vent to trach collar 12/16, PEG tube placement 12/16,  repeat MRI showed an increase in the size and number of acute infarcts in the bilateral hemispheres with increased involvement in the cerebellum bilaterally.   12/18, transferred from neuro service to [facility]  12/21, palliative care consulted.  Family wanted to continue aggressive care as full CODE STATUS. 12/23 developed Cheyne-Stokes respiratory pattern with respiratory alkalosis.  Transferred to ICU for short-term mechanical ventilation and was found to have new tracheobronchitis now on cefepime 12/25 back to progressive floor and to [facility]   His hospital course has been complicated by significant neurological impairment, aspiration events, AKI, hypernatremia, urinary retention. 1/9 trach changed to #6.0 Shiley cuffless--1/10 > 30 days since trach inserted.  Subjective: Unresponsive.  Nursing staff at bedside performing bath in a.m. care.  Wife Stephen Delgado at bedside and also updated.  Objective: Vitals:   02/23/21 0427 02/23/21 0716  BP:    Pulse: 93 100  Resp: (!) 22 16  Temp:    SpO2: 96% 98%    Intake/Output Summary (Last 24 hours) at 02/23/2021 0735 Last data filed at 02/23/2021 0410 Gross per 24 hour  Intake --  Output 1250 ml  Net -1250 ml   Filed Weights   02/07/21 0500 02/09/21 0500 02/20/21 0347  Weight: 80.2 kg 80.1 kg 79.8 kg    Exam:  Constitutional: Unresponsive, NAD Respiratory: #6.0 Shiley cuffless trach,  Lung sounds more coarse but clear without rhonchi today, trach with thick tan secretions .  Normal respiratory pattern today Cardiovascular: S1-S2, NSR, essentially normotensive and previous tachycardia has resolved, no bilateral lower extremity edema  Abdomen:  Abdomen soft and nontender.  PEG tube -normoactive bowels -rectal Foley in place draining dark thick brown rations to back Neurologic: Upper extremities are flaccid to PROM.-No  further hypertonicity of lower extremities elicited with PROM.  Psychiatric: Unresponsive   Assessment/Plan: Acute  problems: Brain injury 2/2 acute intraparenchymal and intraventricular hemorrhage/Bilateral multifocal ischemic infarcts  -Follow-up MRI on 12/16 demonstrated increase in size and number of acute infarcts  -Neurology signed off -currently not on blood thinners because of intracranial hemorrhage. -Has intermittent episodes of [place] and Cheyne-Stokes respiratory patterns alternating with normal respiratory pattern -Continue baclofen for hypertonicity; continue scheduled Oxy IR for suspected underlying pain  - continue Symmetrel and Zyprexa  EOL discussion -per PMT 1/8 family requests that current level of care be continued including full CODE STATUS  Acute respiratory failure with hypoxia 2/2 recurrent Serratia tracheobronchitis (recent MSSA and serratia pneumonia; recent Klebsiella tracheobronchitis); -Completed Zosyn on 12/21 -12/23 recurrent acute tracheobronchitis-completed 7 days of Cefepime as rec by PCCM based on cxs -Continue scopolamine for excessive oral secretions to help minimize risk for aspiration -1/16 tracheal aspirate recultured-few Serratia sent to cefazolin, MODERATE STREPTOCOCCUS CONSTELLATUS (Beta hemolytic streptococci are predictably susceptible to penicillin and other beta lactams. Susceptibility testing not routinely performed. ) -Ck CT sinuses reveals rhinosinusitis worse on the left -ID has seen in consultation-no indication for antibiotics at this time but recommend consult ENT and continue monitoring vital signs/fever -ENT Dr. Fredric Dine has been consulted as of 1/20 and as of 1/23 consult pending-if no response on 1/24 we will need to contact ENT on-call -1/23 wife states patient has history of chronic sinus issues  Stage II perirectal pressure wound (resolved) -Consider discontinuation of rectal tube once volume of liquid stool has decreased -Scheduled MiraLAX recently changed to prn any significant change in stool consistency -Continue Neutrisource fiber  pack  Bilateral lower extremity edema -Resolved after solitary dose of IV Lasix  Essential hypertension/EKG with LVH strain -Blood pressure on admission was elevated to over 163 systolic. -Continue max dose Coreg, hydralazine and clonidine -BP poorly controlled in the evenings and overnight so will adjust clonidine from 0.1 mg TID to 0.2 mg BID and add Norvasc 2.5 mg at HS -could be from hypertonicity and ? Awakening? so will increase Baclofen   AKI on CKD 3b/acute azotemia with mild hypernatremia -Baseline renal function: January 2022 BUN was 18 and creatinine 1.49 -Current creatinine 1.18 with a BUN of 37 -1/17 sodium 147 and BUN 61 therefore will need to increase free water to 150 cc every 6 hours   Non obstructive transaminitis -Recurrent issue throughout hospitalization -CT abdomen and pelvis 12/23 unremarkable no evidence of cirrhosis or other biliary tree issues -LFTs improved with mild elevation in ALT of 49 as of 1/9 -Hepatitis panel unremarkable  Type 2 diabetes mellitus -A1c 6.3 in 12/31/2020 -Increase Levemir to 24 units BID; continue SSI every 4 hours -CBGs well controlled   Protein calorie malnutrition/dysphagia Nutrition Problem: Inadequate oral intake Etiology: lethargy/confusion, dysphagia Signs/Symptoms: NPO status Interventions: Tube feeding via PEG Body mass index is 23.86 kg/m.    Acute urinary retention  -Continue Urecholine -Foley catheter discontinued on 1/11 and patient voiding easily      Data Reviewed: Basic Metabolic Panel: Recent Labs  Lab 02/17/21 0805 02/19/21 0341 02/21/21 0153  NA 147* 147* 148*  K 4.3 4.2 4.4  CL 109 110 111  CO2 _0 GLUCOSE 199* 150* 302*  BUN 61* 63* 58*  CREATININE 1.17 1.06 1.05  CALCIUM 10.0 9.6 10.1    CBC: Recent Labs  Lab 02/17/21 0805 02/21/21 0153  WBC 9.4 11.4*  NEUTROABS 7.3 8.9*  HGB 12.3* 11.7*  HCT 41.4  38.7*  MCV 80.5 79.5*  PLT 187 214     CBG: Recent Labs  Lab  02/22/21 1215 02/22/21 1602 02/22/21 2005 02/23/21 0016 02/23/21 0358  GLUCAP 185* 193* 184* 203* 212*      Scheduled Meds:  amantadine  100 mg Per Tube BID   amLODipine  2.5 mg Per Tube QHS   baclofen  10 mg Per Tube TID AC & HS   bethanechol  25 mg Per Tube TID   carvedilol  25 mg Per Tube BID WC   chlorhexidine gluconate (MEDLINE KIT)  15 mL Mouth Rinse BID   Chlorhexidine Gluconate Cloth  6 each Topical Q0600   cloNIDine  0.2 mg Per Tube BID   fiber  1 packet Per Tube BID   free water  150 mL Per Tube Q6H   heparin injection (subcutaneous)  5,000 Units Subcutaneous Q8H   hydrALAZINE  100 mg Per Tube Q8H   insulin aspart  0-20 Units Subcutaneous Q4H   insulin detemir  24 Units Subcutaneous BID   mouth rinse  15 mL Mouth Rinse 10 times per day   nutrition supplement (JUVEN)  1 packet Per Tube BID BM   OLANZapine  2.5 mg Per Tube Daily   OLANZapine  5 mg Per Tube QHS   oxyCODONE  2.5 mg Per Tube Q8H   pantoprazole sodium  40 mg Per Tube Daily   scopolamine  1 patch Transdermal Q72H   Continuous Infusions:  feeding supplement (GLUCERNA 1.5 CAL) 1,000 mL (02/22/21 0018)    Principal Problem:   ICH (intracerebral hemorrhage) (HCC) Active Problems:   Malignant hypertension   Diabetes mellitus type 2 in obese (HCC)   Acute respiratory failure (HCC)   Tracheostomy dependence ([place])   Pneumonia due to Serratia marcescens ([place])   MSSA (methicillin susceptible Staphylococcus aureus) pneumonia ([place])   Stage 3b chronic kidney disease (CKD) ([place])   Acute kidney injury ([place])   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 ([place])   Acute hypernatremia   Dysphagia due to recent stroke   Acute urinary retention   Respiration abnormal   Pressure injury of skin   Abdominal distention   Intractable hiccups   Pneumonia due to Klebsiella pneumoniae (HCC)   Swollen lip   Fever   Muscular hypertonicity   Tracheobronchitis, chronic ([place])   Acute bacterial  sinusitis   Consultants: Neurology PCCM Medicine  Procedures: Echocardiogram EEG Cortrack PEG tube placement Tracheostomy tube placement  Antibiotics: Zosyn 12/15 through 12/21 Cefepime 12/24 through 12/27 Bactrim 12/27 >> 12/29 Cefepime 12/31 through 1/5   Time spent: 25 minutes    Erin Hearing ANP  [facility] 7 am - 330 pm/M-F for direct patient care and secure chat Please refer to Amion for contact info 55  days

## 2021-02-23 NOTE — TOC Progression Note (Addendum)
Transition of Care [facility]) - Progression Note    Patient Details  Name: CORRION STIREWALT MRN: [id number] Date of Birth: 1965-08-29  [facility] CM/SW Contact  [name], RN Phone Number: 02/23/2021, 10:17 AM  Clinical Narrative:    Case management called and spoke with Dartha Lodge, CM with [facility] and the patient has no bed offers available for admission at this time.  [facility] was unable to offer an admission bed to the patient.  I called and left a message with Gaetana Michaelis, CM at [facility] for possible bed offer at the facility.  CM called and spoke with Esaw Grandchild, CM at [facility] facility and clinicals were sent to her secure email to review patient's clinicals for possible bed offer ([email or web address]).  02/23/2021 1419 - Respiratory Care notes and MAR sent to [place], CM at [facility] in [place], [place].  CM and MSW with DTP Team continue to follow the patient for SNF/LTC placement at a skilled nursing facility.    Expected Discharge Plan: Skilled Nursing Facility Barriers to Discharge: Continued Medical Work up  Expected Discharge Plan and Services Expected Discharge Plan: Skilled Nursing Facility   Discharge Planning Services: CM Consult Post Acute Care Choice: Skilled Nursing Facility Living arrangements for the past 2 months: Single Family Home                                       Social Determinants of Health (SDOH) Interventions    Readmission Risk Interventions Readmission Risk Prevention Plan 02/20/2021  Transportation Screening Complete  Medication Review (RN Care Manager) Complete  PCP or Specialist appointment within 3-5 days of discharge Not Complete  PCP/Specialist Appt Not Complete comments Patient will need LTC placement at [facility] facility.  HRI or Home Care Consult Complete  SW Recovery Care/Counseling Consult Complete  Palliative Care Screening Complete   Skilled Nursing Facility Complete  Some recent data might be hidden

## 2021-02-23 NOTE — Plan of Care (Signed)
  Problem: Clinical Measurements: Goal: Respiratory complications will improve Outcome: Progressing Goal: Cardiovascular complication will be avoided Outcome: Progressing   Problem: Activity: Goal: Risk for activity intolerance will decrease Outcome: Progressing   Problem: Nutrition: Goal: Adequate nutrition will be maintained Outcome: Progressing   

## 2021-02-24 DIAGNOSIS — R7401 Elevation of levels of liver transaminase levels: Secondary | ICD-10-CM

## 2021-02-24 LAB — GLUCOSE, CAPILLARY
Glucose-Capillary: 131 mg/dL — ABNORMAL HIGH (ref 70–99)
Glucose-Capillary: 154 mg/dL — ABNORMAL HIGH (ref 70–99)
Glucose-Capillary: 161 mg/dL — ABNORMAL HIGH (ref 70–99)
Glucose-Capillary: 166 mg/dL — ABNORMAL HIGH (ref 70–99)
Glucose-Capillary: 188 mg/dL — ABNORMAL HIGH (ref 70–99)
Glucose-Capillary: 207 mg/dL — ABNORMAL HIGH (ref 70–99)

## 2021-02-24 MED ORDER — LOPERAMIDE HCL 1 MG/7.5ML PO SUSP
1.0000 mg | Freq: Every day | ORAL | Status: DC
  Administered 2021-02-24 – 2021-03-19 (×22): 1 mg
  Filled 2021-02-24 (×23): qty 7.5
  Filled 2021-02-24: qty 15
  Filled 2021-02-24 (×3): qty 7.5

## 2021-02-24 NOTE — Progress Notes (Signed)
[facility] PROGRESS NOTE  Branch Pacitti Voyles [id number]:[id number] DOB: 08-26-1965 DOA: 12/30/2020 PCP: Patient, [name]   01/29/2021    01/29/2021                 02/04/2020  Status: Remains inpatient appropriate because:  Unsafe discharge plan.  Patient in persistent vegetative state per stroke needs placement at skilled nursing facility that is trach capable-medically stable for discharge noting trach has been in place now for 30 days  Barriers to discharge: Social: Current insurance coverage about to expire and he otherwise has no funding for long-term care.  Financial services clarifying regarding eligibility for disability and Medicaid  Clinical: Tracheostomy and PEG dependent with recurrent tracheobronchitis; remains a full code  Level of care:  Med-Surg    Code Status: Full code Family Communication:1/23 wife [name] at bedside DVT prophylaxis: SCDs COVID vaccination status: Unknown  Foley catheter: 55 French inserted on 12/17; changed on 12/24 POA: No  HPI: 56 y.o. male with PMH significant for DM2, HTN, CKD with questionable compliance to medications Patient presented to the ED on 12/30/2020 with complaint of headache, altered mental status   In the ED, he was hypertensive to 238/160, agitated, required restraints CT head showed large amount of intraventricular hemorrhage involving lateral/third/fourth ventricle, ICH and left basal ganglia as well as chronic microvascular ischemic changes. He was started on Cleviprex drip and admitted to neuro ICU   He subsequently had CT head repeated on 11/30, 12/1 and 12/3, all demonstrated unchanged intraparenchymal hemorrhage and intraventricular hemorrhage 12/3, MRI brain from also showed numerous small acute infarcts in both cerebral hemisphere with minimal improvement in the right superior cerebellum 12/3, intubated 12/13, tracheostomy 12/15, wean from vent to trach collar 12/16, PEG tube placement 12/16,  repeat MRI showed an increase in the size and number of acute infarcts in the bilateral hemispheres with increased involvement in the cerebellum bilaterally.   12/18, transferred from neuro service to [facility]  12/21, palliative care consulted.  Family wanted to continue aggressive care as full CODE STATUS. 12/23 developed Cheyne-Stokes respiratory pattern with respiratory alkalosis.  Transferred to ICU for short-term mechanical ventilation and was found to have new tracheobronchitis now on cefepime 12/25 back to progressive floor and to [facility]   His hospital course has been complicated by significant neurological impairment, aspiration events, AKI, hypernatremia, urinary retention. 1/9 trach changed to #6.0 Shiley cuffless--1/10 > 30 days since trach inserted.  Subjective: Unresponsive to both verbal and tactile stimulation.  Objective: Vitals:   02/24/21 0438 02/24/21 0726  BP:  (!) 154/82  Pulse:  95  Resp: 16 15  Temp:    SpO2:  98%    Intake/Output Summary (Last 24 hours) at 02/24/2021 0750 Last data filed at 02/24/2021 0540 Gross per 24 hour  Intake --  Output 1375 ml  Net -1375 ml   Filed Weights   02/07/21 0500 02/09/21 0500 02/20/21 0347  Weight: 80.2 kg 80.1 kg 79.8 kg    Exam:  Constitutional: Unresponsive, NAD Respiratory: #6.0 Shiley cuffless trach,  Lung sounds more coarse but clear without rhonchi today, trach with thick tan secretions .  Normal respiratory pattern today-yesterday was having episodes of Cheyne-Stokes respiratory pattern Cardiovascular: S1-S2, NSR, normotensive -no bilateral lower extremity edema  Abdomen:  Abdomen soft and nontender.  PEG tube -normoactive bowels -rectal Foley in place draining dark thick brown rations to back Neurologic: Upper extremities are flaccid to PROM.-No further hypertonicity of lower extremities elicited with PROM.  Psychiatric: Unresponsive  Assessment/Plan: Acute problems: Brain injury 2/2 acute intraparenchymal and  intraventricular hemorrhage/Bilateral multifocal ischemic infarcts  -Follow-up MRI on 12/16 demonstrated increase in size and number of acute infarcts  -Neurology signed off -currently not on blood thinners because of intracranial hemorrhage. -Has intermittent episodes of [place] and Cheyne-Stokes respiratory patterns alternating with normal respiratory pattern -Continue baclofen for hypertonicity; continue scheduled Oxy IR for suspected underlying pain  - continue Symmetrel and Zyprexa for sequelae of brain injury  EOL discussion -per PMT 1/8 family requests that current level of care be continued including full CODE STATUS  Acute respiratory failure with hypoxia 2/2 recurrent Serratia tracheobronchitis (recent MSSA and serratia pneumonia; recent Klebsiella tracheobronchitis); -Completed Zosyn on 12/21 -12/23 recurrent acute tracheobronchitis-completed 7 days of Cefepime  -Continue scopolamine for excessive oral secretions to help minimize risk for aspiration -1/16 tracheal aspirate recultured-few Serratia but no clinical signs of infection so ID rec no anbxs -Ck CT sinuses reveals rhinosinusitis worse on the left -1/23 wife states patient has history of chronic sinus issues  Loose stool/rectal tube -Continue Neutrisource fiber pack -1/24 start imodium  Bilateral lower extremity edema -Resolved after solitary dose of IV Lasix  Known malignant hypertension/EKG with LVH strain -Continue max dose Coreg, hydralazine and clonidine -Cont clonidine and add Norvasc    AKI on CKD 3b/acute azotemia with mild hypernatremia -Baseline renal function: January 2022 BUN was 18 and creatinine 1.49 -Current creatinine 1.18 with a BUN of 37 -1/21 BUN 58, Cr 1.05   Non obstructive transaminitis -Recurrent issue throughout hospitalization -CT abdomen and pelvis 12/23 unremarkable no evidence of cirrhosis or other biliary tree issues -LFTs improved with mild elevation in ALT of 49 as of 1/9 but ALTa  back up now 130 as of 1/21-repeat in am 1/25 (Zyprexa can cause hepatotoxicity) -Hepatitis panel unremarkable  Type 2 diabetes mellitus -A1c 6.3 in 12/31/2020 -Increase Levemir to 24 units BID; continue SSI every 4 hours -CBGs well controlled   Protein calorie malnutrition/dysphagia Nutrition Problem: Inadequate oral intake Etiology: lethargy/confusion, dysphagia Signs/Symptoms: NPO status Interventions: Tube feeding via PEG Body mass index is 23.86 kg/m.    Acute urinary retention  -Continue Urecholine -Foley catheter discontinued on 1/11 and patient voiding easily      Data Reviewed: Basic Metabolic Panel: Recent Labs  Lab 02/17/21 0805 02/19/21 0341 02/21/21 0153  NA 147* 147* 148*  K 4.3 4.2 4.4  CL 109 110 111  CO2 _0 GLUCOSE 199* 150* 302*  BUN 61* 63* 58*  CREATININE 1.17 1.06 1.05  CALCIUM 10.0 9.6 10.1    CBC: Recent Labs  Lab 02/17/21 0805 02/21/21 0153  WBC 9.4 11.4*  NEUTROABS 7.3 8.9*  HGB 12.3* 11.7*  HCT 41.4 38.7*  MCV 80.5 79.5*  PLT 187 214     CBG: Recent Labs  Lab 02/23/21 1134 02/23/21 1633 02/23/21 2042 02/24/21 0001 02/24/21 0413  GLUCAP 168* 175* 165* 188* 154*      Scheduled Meds:  amantadine  100 mg Per Tube BID   amLODipine  2.5 mg Per Tube QHS   baclofen  10 mg Per Tube TID AC & HS   bethanechol  25 mg Per Tube TID   carvedilol  25 mg Per Tube BID WC   chlorhexidine gluconate (MEDLINE KIT)  15 mL Mouth Rinse BID   Chlorhexidine Gluconate Cloth  6 each Topical Q0600   cloNIDine  0.2 mg Per Tube BID   fiber  1 packet Per Tube BID   free water  150  mL Per Tube Q6H   heparin injection (subcutaneous)  5,000 Units Subcutaneous Q8H   hydrALAZINE  100 mg Per Tube Q8H   insulin aspart  0-20 Units Subcutaneous Q4H   insulin detemir  24 Units Subcutaneous BID   mouth rinse  15 mL Mouth Rinse 10 times per day   nutrition supplement (JUVEN)  1 packet Per Tube BID BM   OLANZapine  2.5 mg Per Tube Daily    OLANZapine  5 mg Per Tube QHS   oxyCODONE  2.5 mg Per Tube Q8H   pantoprazole sodium  40 mg Per Tube Daily   scopolamine  1 patch Transdermal Q72H   Continuous Infusions:  feeding supplement (GLUCERNA 1.5 CAL) 1,000 mL (02/23/21 1006)    Principal Problem:   ICH (intracerebral hemorrhage) ([place]) Active Problems:   Malignant hypertension   Diabetes mellitus type 2 in obese ([place])   Acute respiratory failure ([place])   Tracheostomy dependence ([place])   Pneumonia due to Serratia marcescens ([place])   MSSA (methicillin susceptible Staphylococcus aureus) pneumonia ([place])   Stage 3b chronic kidney disease (CKD) ([place])   Acute kidney injury ([place])   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 ([place])   Acute hypernatremia   Dysphagia due to recent stroke   Acute urinary retention   Respiration abnormal   Pressure injury of skin   Abdominal distention   Intractable hiccups   Pneumonia due to Klebsiella pneumoniae (HCC)   Swollen lip   Fever   Muscular hypertonicity   Tracheobronchitis, chronic ([place])   Acute bacterial sinusitis   Consultants: Neurology PCCM Medicine  Procedures: Echocardiogram EEG Cortrack PEG tube placement Tracheostomy tube placement  Antibiotics: Zosyn 12/15 through 12/21 Cefepime 12/24 through 12/27 Bactrim 12/27 >> 12/29 Cefepime 12/31 through 1/5   Time spent: 15 minutes    Erin Hearing ANP  [facility] 7 am - 330 pm/M-F for direct patient care and secure chat Please refer to Amion for contact info 56  days

## 2021-02-24 NOTE — Plan of Care (Signed)
°  Problem: Clinical Measurements: Goal: Ability to maintain clinical measurements within normal limits will improve Outcome: Progressing Goal: Will remain free from infection Outcome: Progressing   Problem: Elimination: Goal: Will not experience complications related to bowel motility Outcome: Progressing   Problem: Safety: Goal: Ability to remain free from injury will improve Outcome: Progressing   Problem: Nutrition: Goal: Risk of aspiration will decrease Outcome: Progressing

## 2021-02-24 NOTE — TOC Progression Note (Signed)
Transition of Care [facility]) - Progression Note    Patient Details  Name: Stephen Delgado MRN: [id number] Date of Birth: 05-15-1965  Transition of Care [facility]) CM/SW Contact  [name], RN Phone Number: 02/24/2021, 12:13 PM  Clinical Narrative:    CM called and left a message with Esaw Grandchild, CM at [facility] in [place], [place] to check on availability of beds and review of patient's clinicals sent yesterday, 02/23/2021 to possible bed offer.  The patient has no current bed offers at this time - barriers to discharge include complex care needs, no bed offers for SNF placement and pending Medicaid screening.  CM and MSW wit DTP Team continue to follow the patient for SNF/LTC placement at a skilled nursing facility with tracheostomy care capability.   Expected Discharge Plan: Skilled Nursing Facility Barriers to Discharge: Continued Medical Work up  Expected Discharge Plan and Services Expected Discharge Plan: Skilled Nursing Facility   Discharge Planning Services: CM Consult Post Acute Care Choice: Skilled Nursing Facility Living arrangements for the past 2 months: Single Family Home                                       Social Determinants of Health (SDOH) Interventions    Readmission Risk Interventions Readmission Risk Prevention Plan 02/20/2021  Transportation Screening Complete  Medication Review (RN Care Manager) Complete  PCP or Specialist appointment within 3-5 days of discharge Not Complete  PCP/Specialist Appt Not Complete comments Patient will need LTC placement at [facility] facility.  HRI or Home Care Consult Complete  SW Recovery Care/Counseling Consult Complete  Palliative Care Screening Complete  Skilled Nursing Facility Complete  Some recent data might be hidden

## 2021-02-25 LAB — COMPREHENSIVE METABOLIC PANEL
ALT: 243 U/L — ABNORMAL HIGH (ref 0–44)
AST: 74 U/L — ABNORMAL HIGH (ref 15–41)
Albumin: 2.5 g/dL — ABNORMAL LOW (ref 3.5–5.0)
Alkaline Phosphatase: 110 U/L (ref 38–126)
Anion gap: 13 (ref 5–15)
BUN: 83 mg/dL — ABNORMAL HIGH (ref 6–20)
CO2: 26 mmol/L (ref 22–32)
Calcium: 10.4 mg/dL — ABNORMAL HIGH (ref 8.9–10.3)
Chloride: 113 mmol/L — ABNORMAL HIGH (ref 98–111)
Creatinine, Ser: 1.14 mg/dL (ref 0.61–1.24)
GFR, Estimated: >60 mL/min (ref >60)
Glucose, Bld: 179 mg/dL — ABNORMAL HIGH (ref 70–99)
Potassium: 4.4 mmol/L (ref 3.5–5.1)
Sodium: 152 mmol/L — ABNORMAL HIGH (ref 135–145)
Total Bilirubin: 0.4 mg/dL (ref 0.3–1.2)
Total Protein: 7.6 g/dL (ref 6.5–8.1)

## 2021-02-25 LAB — GLUCOSE, CAPILLARY
Glucose-Capillary: 135 mg/dL — ABNORMAL HIGH (ref 70–99)
Glucose-Capillary: 141 mg/dL — ABNORMAL HIGH (ref 70–99)
Glucose-Capillary: 156 mg/dL — ABNORMAL HIGH (ref 70–99)
Glucose-Capillary: 162 mg/dL — ABNORMAL HIGH (ref 70–99)
Glucose-Capillary: 170 mg/dL — ABNORMAL HIGH (ref 70–99)
Glucose-Capillary: 197 mg/dL — ABNORMAL HIGH (ref 70–99)
Glucose-Capillary: 222 mg/dL — ABNORMAL HIGH (ref 70–99)

## 2021-02-25 MED ORDER — CHOLESTYRAMINE 4 G PO PACK
4.0000 g | PACK | Freq: Three times a day (TID) | ORAL | Status: DC
  Administered 2021-02-25 – 2021-03-19 (×67): 4 g
  Filled 2021-02-25 (×70): qty 1

## 2021-02-25 MED ORDER — FREE WATER
200.0000 mL | Status: DC
  Administered 2021-02-25 – 2021-02-28 (×18): 200 mL

## 2021-02-25 MED ORDER — OLANZAPINE 2.5 MG PO TABS
2.5000 mg | ORAL_TABLET | Freq: Every day | ORAL | Status: DC
  Administered 2021-02-25 – 2021-04-29 (×64): 2.5 mg
  Filled 2021-02-25 (×65): qty 1

## 2021-02-25 NOTE — TOC Progression Note (Addendum)
Transition of Care [facility]) - Progression Note    Patient Details  Name: Stephen Delgado MRN: [id number] Date of Birth: 1965-09-20  Transition of Care [facility]) CM/SW Contact  [name], RN Phone Number: 02/25/2021, 8:11 AM  Clinical Narrative:    CM spoke with Domingo Madeira, CM at [facility] in [place], [place] and she is reviewing patient's clinicals for possible bed offer.  I also spoke with Mal Amabile, CM with [facility] and asked that the facility review the patient's clinicals for needed SNF placement - clinicals emailed to [email or web address].  CM and MSW with DTP Team will continue to follow the patient for SNF placement.   Expected Discharge Plan: Skilled Nursing Facility Barriers to Discharge: Continued Medical Work up  Expected Discharge Plan and Services Expected Discharge Plan: Skilled Nursing Facility   Discharge Planning Services: CM Consult Post Acute Care Choice: Skilled Nursing Facility Living arrangements for the past 2 months: Single Family Home                                       Social Determinants of Health (SDOH) Interventions    Readmission Risk Interventions Readmission Risk Prevention Plan 02/20/2021  Transportation Screening Complete  Medication Review (RN Care Manager) Complete  PCP or Specialist appointment within 3-5 days of discharge Not Complete  PCP/Specialist Appt Not Complete comments Patient will need LTC placement at [facility] facility.  HRI or Home Care Consult Complete  SW Recovery Care/Counseling Consult Complete  Palliative Care Screening Complete  Skilled Nursing Facility Complete  Some recent data might be hidden

## 2021-02-25 NOTE — Progress Notes (Signed)
[facility] PROGRESS NOTE  Willett Lefeber Keysor [id number]:[id number] DOB: 07-07-1965 DOA: 12/30/2020 PCP: Patient, [name]   01/29/2021    01/29/2021                 02/04/2020  Status: Remains inpatient appropriate because:  Unsafe discharge plan.  Patient in persistent vegetative state per stroke needs placement at skilled nursing facility that is trach capable-medically stable for discharge noting trach has been in place now for 30 days  Barriers to discharge: Social: Current insurance coverage about to expire and he otherwise has no funding for long-term care.  Financial services clarifying regarding eligibility for disability and Medicaid  Clinical: Tracheostomy and PEG dependent with recurrent tracheobronchitis; remains a full code  Level of care:  Med-Surg    Code Status: Full code Family Communication:1/24 wife [name]  DVT prophylaxis: SCDs COVID vaccination status: Unknown  Foley catheter: 105 French inserted on 12/17; changed on 12/24 POA: No  HPI: 56 y.o. male with PMH significant for DM2, HTN, CKD with questionable compliance to medications Patient presented to the ED on 12/30/2020 with complaint of headache, altered mental status   In the ED, he was hypertensive to 238/160, agitated, required restraints CT head showed large amount of intraventricular hemorrhage involving lateral/third/fourth ventricle, ICH and left basal ganglia as well as chronic microvascular ischemic changes. He was started on Cleviprex drip and admitted to neuro ICU   He subsequently had CT head repeated on 11/30, 12/1 and 12/3, all demonstrated unchanged intraparenchymal hemorrhage and intraventricular hemorrhage 12/3, MRI brain from also showed numerous small acute infarcts in both cerebral hemisphere with minimal improvement in the right superior cerebellum 12/3, intubated 12/13, tracheostomy 12/15, wean from vent to trach collar 12/16, PEG tube placement 12/16, repeat MRI  showed an increase in the size and number of acute infarcts in the bilateral hemispheres with increased involvement in the cerebellum bilaterally.   12/18, transferred from neuro service to [facility]  12/21, palliative care consulted.  Family wanted to continue aggressive care as full CODE STATUS. 12/23 developed Cheyne-Stokes respiratory pattern with respiratory alkalosis.  Transferred to ICU for short-term mechanical ventilation and was found to have new tracheobronchitis now on cefepime 12/25 back to progressive floor and to [facility]   His hospital course has been complicated by significant neurological impairment, aspiration events, AKI, hypernatremia, urinary retention. 1/9 trach changed to #6.0 Shiley cuffless--1/10 > 30 days since trach inserted.  Subjective: Unresponsive -no Cheyne-Stokes respiratory pattern with apnea duration 15 seconds-during episodes of apnea O2 sats decreased from 98 to 94% but improved once respirations resumed  Objective: Vitals:   02/25/21 0325 02/25/21 0407  BP: (!) 140/54 (!) 130/94  Pulse: 96 93  Resp: (!) 22 11  Temp:  98.2 F (36.8 C)  SpO2: 97% 99%    Intake/Output Summary (Last 24 hours) at 02/25/2021 0731 Last data filed at 02/24/2021 0800 Gross per 24 hour  Intake --  Output 200 ml  Net -200 ml   Filed Weights   02/07/21 0500 02/09/21 0500 02/20/21 0347  Weight: 80.2 kg 80.1 kg 79.8 kg    Exam:  Constitutional: Unresponsive, NAD Respiratory: #6.0 Shiley cuffless trach,  Lung sounds more coarse bilaterally,, weak wet sounding cough noted, trach with thick tan secretions .  Cheyne-Stokes pattern with 15-second periods of apnea with O2 sats decreasing from 98% to 94% Cardiovascular: S1-S2, NSR, normotensive -no bilateral lower extremity edema  Abdomen:  Abdomen soft and nontender.  PEG tube -normoactive bowels -rectal Foley  in place draining dark thick brown rations to back Neurologic: Upper extremities are flaccid to PROM.-No further hypertonicity  of lower extremities elicited with PROM.  Psychiatric: Unresponsive   Assessment/Plan: Acute problems: Brain injury 2/2 acute intraparenchymal and intraventricular hemorrhage/Bilateral multifocal ischemic infarcts  -Follow-up MRI on 12/16 demonstrated increase in size and number of acute infarcts  -Neurology signed off -currently not on blood thinners because of intracranial hemorrhage. -Has intermittent episodes of [place] and Cheyne-Stokes respiratory patterns alternating with normal respiratory pattern -Continue baclofen for hypertonicity; continue scheduled Oxy IR for suspected underlying pain  - continue Symmetrel -plan to taper and DC Zyprexa given progressive transaminitis  EOL discussion -per PMT 1/8 family requests that current level of care be continued including full CODE STATUS  Acute respiratory failure with hypoxia 2/2 recurrent Serratia tracheobronchitis (recent MSSA and serratia pneumonia; recent Klebsiella tracheobronchitis); -Completed Zosyn on 12/21 -12/23 recurrent acute tracheobronchitis-completed 7 days of Cefepime  -Continue scopolamine for excessive oral secretions to help minimize risk for aspiration -1/16 tracheal aspirate recultured-few Serratia but no clinical signs of infection so ID rec no anbxs -Ck CT sinuses reveals rhinosinusitis worse on the left -1/23 wife states patient has history of chronic sinus issues  Loose stool/rectal tube -Continue Neutrisource fiber pack -1/24 start imodium  Bilateral lower extremity edema -Resolved after solitary dose of IV Lasix  Known malignant hypertension/EKG with LVH strain -Continue max dose Coreg, hydralazine and clonidine -Cont clonidine and add Norvasc    AKI on CKD 3b/acute azotemia with mild hypernatremia -Baseline renal function: January 2022 BUN was 18 and creatinine 1.49 -Current creatinine 1.18 with a BUN of 37 -1/21 BUN 58, Cr 1.05 and as of 1/25 sodium back up to 152, BUN 83, creatinine 1.14 and  calcium 10.4 all consistent with volume depletion therefore free water increased to 200 cc and frequency increased to every 4 hours   Non obstructive transaminitis -Recurrent issue throughout hospitalization -CT abdomen and pelvis 12/23 unremarkable no evidence of cirrhosis or other biliary tree issues -LFTs had improved with mild elevation in ALT of 49 as of 1/9  -New worsening transaminitis pattern as of 1/21 with ALT 30 with AST normal.  By 1/25 ALT up to 243 and AST now 74 therefore will discontinue daytime dose of Zyprexa and decrease p.m. dose to 2.5 mg.  Plan is to continue taper and DC and likely will DC p.m. dosing after Friday's dose. -Follow hepatic function panel daily and monitor for return to baseline -Hepatitis panel unremarkable  Type 2 diabetes mellitus -A1c 6.3 in 12/31/2020 -Increase Levemir to 24 units BID; continue SSI every 4 hours -CBGs well controlled   Protein calorie malnutrition/dysphagia Nutrition Problem: Inadequate oral intake Etiology: lethargy/confusion, dysphagia Signs/Symptoms: NPO status Interventions: Tube feeding via PEG Body mass index is 23.86 kg/m.    Acute urinary retention  -Continue Urecholine -Foley catheter discontinued on 1/11 and patient voiding easily      Data Reviewed: Basic Metabolic Panel: Recent Labs  Lab 02/19/21 0341 02/21/21 0153 02/25/21 0344  NA 147* 148* 152*  K 4.2 4.4 4.4  CL 110 111 113*  CO2 _0 GLUCOSE 150* 302* 179*  BUN 63* 58* 83*  CREATININE 1.06 1.05 1.14  CALCIUM 9.6 10.1 10.4*    CBC: Recent Labs  Lab 02/21/21 0153  WBC 11.4*  NEUTROABS 8.9*  HGB 11.7*  HCT 38.7*  MCV 79.5*  PLT 214     CBG: Recent Labs  Lab 02/24/21 1204 02/24/21 1540 02/24/21 2032 02/25/21 0039  02/25/21 0406  GLUCAP 207* 161* 166* 197* 156*      Scheduled Meds:  amantadine  100 mg Per Tube BID   amLODipine  2.5 mg Per Tube QHS   baclofen  10 mg Per Tube TID AC & HS   bethanechol  25 mg Per Tube  TID   carvedilol  25 mg Per Tube BID WC   chlorhexidine gluconate (MEDLINE KIT)  15 mL Mouth Rinse BID   Chlorhexidine Gluconate Cloth  6 each Topical Q0600   cloNIDine  0.2 mg Per Tube BID   fiber  1 packet Per Tube BID   free water  200 mL Per Tube Q4H   heparin injection (subcutaneous)  5,000 Units Subcutaneous Q8H   hydrALAZINE  100 mg Per Tube Q8H   insulin aspart  0-20 Units Subcutaneous Q4H   insulin detemir  24 Units Subcutaneous BID   loperamide HCl  1 mg Per Tube Daily   mouth rinse  15 mL Mouth Rinse 10 times per day   nutrition supplement (JUVEN)  1 packet Per Tube BID BM   OLANZapine  2.5 mg Per Tube Daily   OLANZapine  5 mg Per Tube QHS   oxyCODONE  2.5 mg Per Tube Q8H   pantoprazole sodium  40 mg Per Tube Daily   scopolamine  1 patch Transdermal Q72H   Continuous Infusions:  feeding supplement (GLUCERNA 1.5 CAL) 1,000 mL (02/23/21 1006)    Principal Problem:   ICH (intracerebral hemorrhage) (HCC) Active Problems:   Malignant hypertension   Diabetes mellitus type 2 in obese (HCC)   Acute respiratory failure ([place])   Tracheostomy dependence ([place])   Pneumonia due to Serratia marcescens ([place])   MSSA (methicillin susceptible Staphylococcus aureus) pneumonia ([place])   Stage 3b chronic kidney disease (CKD) ([place])   Acute kidney injury ([place])   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 ([place])   Acute hypernatremia   Dysphagia due to recent stroke   Acute urinary retention   Respiration abnormal   Pressure injury of skin   Abdominal distention   Intractable hiccups   Pneumonia due to Klebsiella pneumoniae (HCC)   Swollen lip   Fever   Muscular hypertonicity   Tracheobronchitis, chronic ([place])   Acute bacterial sinusitis   Transaminitis   Consultants: Neurology PCCM Medicine  Procedures: Echocardiogram EEG Cortrack PEG tube placement Tracheostomy tube placement  Antibiotics: Zosyn 12/15 through 12/21 Cefepime 12/24 through  12/27 Bactrim 12/27 >> 12/29 Cefepime 12/31 through 1/5   Time spent: 15 minutes    Erin Hearing ANP  [facility] 7 am - 330 pm/M-F for direct patient care and secure chat Please refer to Amion for contact info 57  days

## 2021-02-25 NOTE — Progress Notes (Signed)
Occupational Therapy Treatment Patient Details Name: Stephen Delgado MRN: [id number] DOB: 1965/06/02 Today's Date: 02/25/2021   History of present illness 56 y/o presented to [facility] ED on 11/29 for AMS, lethargy, and vomiting. Transferred to [facility]. CTH showed L caudate/BG hemorrhage with IVH. Declining respiratory status with snoring respirations. Intubated 12/3. Trach placed 12/13.on 12/16, repeat MRI showed an increase in the size and number of acute infarcts in the bilateral hemispheres with increased involvement in the cerebellum bilaterally. On 12/23, pt transferred to ICU for worsening respiratory status due to tracheobronchitis, requiring short term mechanical ventilation. PMH: HTN, sleep apnea, DM, CKD 3   OT comments  Stephen Delgado is not progressing with no response to noxious stimuli, change in position, no righting reactions or visual responses. Session completed at bed level with general PROM of BUE, trunk rotation, scapular manipulation and cervical PROM. Pt transferred to sitting EOB with total A +2, VSS while sitting ~5 minutes. Plan to coordinate family education session for ROM, handling and positioning next visit prior to signing off on pt. D/c plan remains the same.    Recommendations for follow up therapy are one component of a multi-disciplinary discharge planning process, led by the attending physician.  Recommendations may be updated based on patient status, additional functional criteria and insurance authorization.    Follow Up Recommendations  Skilled nursing-short term rehab (<3 hours/day)    Assistance Recommended at Discharge Frequent or constant Supervision/Assistance  Patient can return home with the following  Other (comment)   Equipment Recommendations  Other (comment)    Recommendations for Other Services Other (comment)    Precautions / Restrictions Precautions Precautions: Fall Precaution Comments: G-tube, trach collar, flexiseal, foley, BUE/pedal  edema Restrictions Weight Bearing Restrictions: No       Mobility Bed Mobility Overal bed mobility: Needs Assistance Bed Mobility: Rolling, Sidelying to Sit, Sit to Sidelying Rolling: Total assist Sidelying to sit: Total assist     Sit to sidelying: Total assist General bed mobility comments: total A for all bed mobility, including sitting EOB balance    Transfers Overall transfer level: Needs assistance                 General transfer comment: deferred     Balance Overall balance assessment: Needs assistance Sitting-balance support: Bilateral upper extremity supported, Feet supported Sitting balance-Leahy Scale: Zero Sitting balance - Comments: total A +2 for sitting balacne                                   ADL either performed or assessed with clinical judgement   ADL Overall ADL's : Needs assistance/impaired                                       General ADL Comments: continues to be total A for care at bed level. no activite or purposeful participation.    Extremity/Trunk Assessment Upper Extremity Assessment Upper Extremity Assessment: LUE deficits/detail;RUE deficits/detail RUE Deficits / Details: axillary tone/contacture, tone noted proximal, flaccid distal LUE Deficits / Details: axillary tone/contacture, tone noted proximal, flaccid distal   Lower Extremity Assessment Lower Extremity Assessment:  (flaccid)        Vision   Vision Assessment?: Vision impaired- to be further tested in functional context   Perception Perception Perception: Not tested   Praxis Praxis Praxis: Not tested  Cognition Arousal/Alertness: Lethargic Behavior During Therapy: Flat affect Overall Cognitive Status: Difficult to assess                                 General Comments: patient did not respond to any stimuli during session - eye were open 75% of session, did not blink to treat & no pupillary response to  light        Exercises Other Exercises Other Exercises: General PROM of BUE; scapular retraction & protraction in sidelying, cervical ROM    Shoulder Instructions       General Comments VSS, RR down to 1 for several seconds x2    Pertinent Vitals/ Pain       Pain Assessment Pain Assessment: Faces Faces Pain Scale: No hurt Pain Location: no response to noxious stimuli - VSS Pain Intervention(s): Monitored during session  Home Living                                          Prior Functioning/Environment              Frequency  Min 1X/week        Progress Toward Goals  OT Goals(current goals can now be found in the care plan section)  Progress towards OT goals: Not progressing toward goals - comment  Acute Rehab OT Goals Patient Stated Goal: unable to state OT Goal Formulation: Patient unable to participate in goal setting Time For Goal Achievement: 03/04/21 Potential to Achieve Goals: Poor ADL Goals Additional ADL Goal #1: Pt will follow 1 step commands 25% of the time as a precursor for ADLs Additional ADL Goal #2: pt will tolerate sitting EOB for at least 10 minutes with max A +2 with no adverse signs or symptoms Additional ADL Goal #3: Pt's family will demonstrate appropriate pt positioning for skin integrity and edema control given verbal cues  Plan Discharge plan remains appropriate    Co-evaluation                 AM-PAC OT "6 Clicks" Daily Activity     Outcome Measure   Help from another person eating meals?: Total Help from another person taking care of personal grooming?: Total Help from another person toileting, which includes using toliet, bedpan, or urinal?: Total Help from another person bathing (including washing, rinsing, drying)?: Total Help from another person to put on and taking off regular upper body clothing?: Total Help from another person to put on and taking off regular lower body clothing?: Total 6 Click  Score: 6    End of Session Equipment Utilized During Treatment: Oxygen  OT Visit Diagnosis: Other abnormalities of gait and mobility (R26.89);Muscle weakness (generalized) (M62.81);Low vision, both eyes (H54.2);Other symptoms and signs involving the nervous system (R29.898);Other symptoms and signs involving cognitive function;Cognitive communication deficit (R41.841) Symptoms and signs involving cognitive functions: Nontraumatic SAH;Other Nontraumatic ICH   Activity Tolerance Patient limited by lethargy   Patient Left in bed;with call bell/phone within reach;with bed alarm set   Nurse Communication Mobility status        Time: 4854-6270 OT Time Calculation (min): 41 min  Charges: OT General Charges $OT Visit: 1 Visit OT Treatments $Therapeutic Activity: 38-52 mins   Moo Gravley A Ares Cardozo 02/25/2021, 4:52 PM

## 2021-02-25 NOTE — Progress Notes (Signed)
Nutrition Follow-up  DOCUMENTATION CODES:   Not applicable  INTERVENTION:  Continue TF via PEG: Glucerna 1.5 @ 76m/hr (14475mday) Free water per MD, currently 20094mree water Q4H   Tube feeding regimen provides 2160 kcal, 118 grams of protein, and 1092 ml of H2O (2292m65mtal free water).  -1 packet Juven BID, each packet provides 95 calories, 2.5 grams of protein (collagen), and 9.8 grams of carbohydrate (3 grams sugar); also contains 7 grams of L-arginine and L-glutamine, 300 mg vitamin C, 15 mg vitamin E, 1.2 mcg vitamin B-12, 9.5 mg zinc, 200 mg calcium, and 1.5 g  Calcium Beta-hydroxy-Beta-methylbutyrate to support wound healing  NUTRITION DIAGNOSIS:   Inadequate oral intake related to lethargy/confusion, dysphagia as evidenced by NPO status.  ongoing  GOAL:   Patient will meet greater than or equal to 90% of their needs  Met with TF  MONITOR:   Diet advancement, Labs, Weight trends  REASON FOR ASSESSMENT:   Ventilator, Consult Enteral/tube feeding initiation and management  ASSESSMENT:   55 y57r old male who presented to the ED on 11/29 with AMS. PMH of HTN, T2DM, CKD stage III. 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  11/30 cortrak placed; tip gastric 12/2 trickle TF 12/3 pt intubated due to increased WOB, +emesis, TF held 12/4 trickle TF restarted 12/5 advancement orders placed 12/9 TF adjusted 12/13 s/p tracheostomy 12/16 s/p EGD and PEG placement 12/19 tx out of ICU to [facility]/23 developed Cheyne-Stokes respiratory pattern with respiratory alkalosis.Transferred to ICU for short-term mechanical ventilation and was found to have new tracheobronchitis now on cefepime 12/25 tx back to [facility] trach changed to #6 shiley cuffless  1/10 marks 30 days since trach placed  Pt continues to be in unresponsive, vegetative state. Continues to require TF via PEG and tolerating w/o issue. Per MD, pt  continues to be medically stable for discharge.   Imodium initiated 1/24 due to loose stools  Current TF: Glucerna 1.5 @ 60ml69mw/ 200ml 78m water Q4H  UOP: 200ml x21mours I/O: -4359ml [id number]  Medications: Scheduled Meds:  amantadine  100 mg Per Tube BID   amLODipine  2.5 mg Per Tube QHS   baclofen  10 mg Per Tube TID AC & HS   bethanechol  25 mg Per Tube TID   carvedilol  25 mg Per Tube BID WC   chlorhexidine gluconate (MEDLINE KIT)  15 mL Mouth Rinse BID   Chlorhexidine Gluconate Cloth  6 each Topical Q0600   cloNIDine  0.2 mg Per Tube BID   fiber  1 packet Per Tube BID   free water  200 mL Per Tube Q4H   heparin injection (subcutaneous)  5,000 Units Subcutaneous Q8H   hydrALAZINE  100 mg Per Tube Q8H   insulin aspart  0-20 Units Subcutaneous Q4H   insulin detemir  24 Units Subcutaneous BID   loperamide HCl  1 mg Per Tube Daily   mouth rinse  15 mL Mouth Rinse 10 times per day   nutrition supplement (JUVEN)  1 packet Per Tube BID BM   OLANZapine  2.5 mg Per Tube Daily   OLANZapine  5 mg Per Tube QHS   oxyCODONE  2.5 mg Per Tube Q8H   pantoprazole sodium  40 mg Per Tube Daily   scopolamine  1 patch Transdermal Q72H  Continuous Infusions:  feeding supplement (GLUCERNA 1.5 CAL) 1,000 mL (02/23/21 1006)   Labs: Recent Labs  Lab 02/19/21 0341 02/21/21  0153 02/25/21 0344  NA 147* 148* 152*  K 4.2 4.4 4.4  CL 110 111 113*  CO2 _0 BUN 63* 58* 83*  CREATININE 1.06 1.05 1.14  CALCIUM 9.6 10.1 10.4*  GLUCOSE 150* 302* 179*  CBGs: 170-156-197-166 Elevated CBGs  Diet Order:   Diet Order             Diet NPO time specified  Diet effective midnight                   EDUCATION NEEDS:   Not appropriate for education at this time  Skin:  Skin Assessment: Skin Integrity Issues: Skin Integrity Issues:: Stage II Stage II: anus  Last BM:  1/25 via rectal tube  Height:   Ht Readings from Last 1 Encounters:  01/23/21 6' (1.829 m)    Weight:    Wt Readings from Last 1 Encounters:  02/20/21 79.8 kg   BMI:  Body mass index is 23.86 kg/m.  Estimated Nutritional Needs:   Kcal:  2150-2350  Protein:  105-120 grams  Fluid:  >2L     Stephen Delgado., MS, RD, LDN (she/her/hers) RD pager number and weekend/on-call pager number located in [place].

## 2021-02-26 LAB — HEPATIC FUNCTION PANEL
ALT: 204 U/L — ABNORMAL HIGH (ref 0–44)
AST: 63 U/L — ABNORMAL HIGH (ref 15–41)
Albumin: 2.3 g/dL — ABNORMAL LOW (ref 3.5–5.0)
Alkaline Phosphatase: 119 U/L (ref 38–126)
Bilirubin, Direct: <0.1 mg/dL (ref 0.0–0.2)
Total Bilirubin: 0.3 mg/dL (ref 0.3–1.2)
Total Protein: 7.4 g/dL (ref 6.5–8.1)

## 2021-02-26 LAB — GLUCOSE, CAPILLARY
Glucose-Capillary: 159 mg/dL — ABNORMAL HIGH (ref 70–99)
Glucose-Capillary: 153 mg/dL — ABNORMAL HIGH (ref 70–99)
Glucose-Capillary: 197 mg/dL — ABNORMAL HIGH (ref 70–99)
Glucose-Capillary: 107 mg/dL — ABNORMAL HIGH (ref 70–99)
Glucose-Capillary: 188 mg/dL — ABNORMAL HIGH (ref 70–99)

## 2021-02-26 NOTE — Progress Notes (Signed)
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 rate 12, height 6' (1.829 m), weight 78.5 kg, SpO2 100 %.  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

## 2021-02-26 NOTE — Progress Notes (Signed)
[facility] PROGRESS NOTE  Stephen Delgado ION:[id number] DOB: 03-04-1965 DOA: 12/30/2020 PCP: Patient, [name]   01/29/2021    01/29/2021                 02/04/2020  Status: Remains inpatient appropriate because:  Unsafe discharge plan.  Patient in persistent vegetative state per stroke needs placement at skilled nursing facility that is trach capable-medically stable for discharge noting trach has been in place now for 30 days  Barriers to discharge: Social: Current insurance coverage about to expire and he otherwise has no funding for long-term care.  Financial services clarifying regarding eligibility for disability and Medicaid  Clinical: Tracheostomy and PEG dependent with recurrent tracheobronchitis; remains a full code  Level of care:  Med-Surg    Code Status: Full code Family Communication:1/24 wife [name]  DVT prophylaxis: SCDs COVID vaccination status: Unknown  Foley catheter: 48 French inserted on 12/17; changed on 12/24 POA: No  HPI: 56 y.o. male with PMH significant for DM2, HTN, CKD with questionable compliance to medications Patient presented to the ED on 12/30/2020 with complaint of headache, altered mental status   In the ED, he was hypertensive to 238/160, agitated, required restraints CT head showed large amount of intraventricular hemorrhage involving lateral/third/fourth ventricle, ICH and left basal ganglia as well as chronic microvascular ischemic changes. He was started on Cleviprex drip and admitted to neuro ICU   He subsequently had CT head repeated on 11/30, 12/1 and 12/3, all demonstrated unchanged intraparenchymal hemorrhage and intraventricular hemorrhage 12/3, MRI brain from also showed numerous small acute infarcts in both cerebral hemisphere with minimal improvement in the right superior cerebellum 12/3, intubated 12/13, tracheostomy 12/15, wean from vent to trach collar 12/16, PEG tube placement 12/16, repeat MRI  showed an increase in the size and number of acute infarcts in the bilateral hemispheres with increased involvement in the cerebellum bilaterally.   12/18, transferred from neuro service to [facility]  12/21, palliative care consulted.  Family wanted to continue aggressive care as full CODE STATUS. 12/23 developed Cheyne-Stokes respiratory pattern with respiratory alkalosis.  Transferred to ICU for short-term mechanical ventilation and was found to have new tracheobronchitis now on cefepime 12/25 back to progressive floor and to [facility]   His hospital course has been complicated by significant neurological impairment, aspiration events, AKI, hypernatremia, urinary retention. 1/9 trach changed to #6.0 Shiley cuffless--1/10 > 30 days since trach inserted.  Subjective: Upon entry into the room patient's eyes were open.  During my interaction with him he was blinking repetitively noting previously I had asked him to blink sequentially to answer questions.  Eventually he stopped blinking repetitively.  Closed his eyes but open them back up.  Note I position is moving around but he is not tracking people in the room and is otherwise not responding to interaction.  Objective: Vitals:   02/26/21 0310 02/26/21 0428  BP: (!) 148/88   Pulse: 93 93  Resp: 10 14  Temp: 98.8 F (37.1 C)   SpO2: 96% 100%    Intake/Output Summary (Last 24 hours) at 02/26/2021 0728 Last data filed at 02/26/2021 0645 Gross per 24 hour  Intake --  Output 2750 ml  Net -2750 ml   Filed Weights   02/09/21 0500 02/20/21 0347 02/26/21 0500  Weight: 80.1 kg 79.8 kg 78.5 kg    Exam:  Constitutional: Unresponsive, NAD Respiratory: #6.0 Shiley cuffless trach,  Lung sounds more coarse bilaterally,, weak wet sounding cough noted, trach with thick  tan secretions .  Cheyne-Stokes pattern with 15-second periods of apnea with O2 sats decreasing from 98% to 94% Cardiovascular: S1-S2, NSR, normotensive -no bilateral lower extremity edema   Abdomen:  Abdomen soft and nontender.  PEG tube -normoactive bowels -rectal Foley in place draining dark thick brown rations to back Neurologic: Upper extremities are flaccid to PROM.-No further hypertonicity of lower extremities elicited with PROM.  Psychiatric: Unresponsive   Assessment/Plan: Acute problems: Brain injury 2/2 acute intraparenchymal and intraventricular hemorrhage/Bilateral multifocal ischemic infarcts  -Follow-up MRI on 12/16 demonstrated increase in size and number of acute infarcts  -No a/c intracranial hemorrhage. -Has intermittent episodes of [place] and Cheyne-Stokes respiratory patterns alternating with normal respiratory pattern -Continue baclofen for hypertonicity; continue scheduled Oxy IR for suspected underlying pain  - continue Symmetrel -LFTs were elevated so stopped daytime dose and decrease nighttime dose to 2.5.  Patient more awake and LFTs slightly improving.  As long as LFTs continue to decrease will continue low-dose Zyprexa at bedtime. -No real progress per OT- plan is to have family at bedside for education then sign off  EOL discussion -per PMT 1/8 family requests that current level of care be continued including full CODE STATUS  Acute respiratory failure with hypoxia 2/2 recurrent Serratia tracheobronchitis (recent MSSA and serratia pneumonia; recent Klebsiella tracheobronchitis); -Completed Zosyn on 12/21 -12/23 recurrent acute tracheobronchitis-completed 7 days of Cefepime  -Continue scopolamine for excessive oral secretions to help minimize risk for aspiration -1/16 tracheal aspirate recultured-few Serratia but no clinical signs of infection so ID rec no anbxs -CT sinuses; rhinosinusitis worse on the left -1/23 wife states patient has history of chronic sinus issues  Loose stool/rectal tube -Continue Neutrisource fiber pack AND Imodium   Known malignant hypertension/EKG with LVH strain -Continue max dose Coreg, hydralazine, Norvasc and  clonidine   AKI on CKD 3b/acute azotemia with mild hypernatremia -Baseline renal function: January 2022 BUN was 18 and creatinine 1.49 -Current creatinine 1.18 with a BUN of 37 -Sodium has been trending upward as has BUN and calcium.  On 1/25 increased free water to 200 cc every 4 hours   Non obstructive transaminitis -Recurrent issue throughout hospitalization -CT abdomen and pelvis 12/23 unremarkable no evidence of cirrhosis or other biliary tree issues -LFTs had improved with mild elevation in ALT of 49 as of 1/9  -New worsening transaminitis pattern as of 1/21 with ALT 30 with AST normal.  By 1/25 ALT up to 243 and AST now 74 therefore will discontinue daytime dose of Zyprexa and decrease p.m. dose to 2.5 mg.  Plan is to continue taper and DC and likely will DC p.m. dosing after Friday's dose. -Follow hepatic function panel daily and monitor for return to baseline -Hepatitis panel unremarkable  Type 2 diabetes mellitus -A1c 6.3 in 12/31/2020 -Increase Levemir to 24 units BID; continue SSI every 4 hours -CBGs well controlled   Protein calorie malnutrition/dysphagia Nutrition Problem: Inadequate oral intake Etiology: lethargy/confusion, dysphagia Signs/Symptoms: NPO status Interventions: Tube feeding via PEG Body mass index is 23.46 kg/m.    Acute urinary retention  -Continue Urecholine -Foley catheter discontinued on 1/11 and patient voiding easily      Data Reviewed: Basic Metabolic Panel: Recent Labs  Lab 02/21/21 0153 02/25/21 0344  NA 148* 152*  K 4.4 4.4  CL 111 113*  CO2 28 26  GLUCOSE 302* 179*  BUN 58* 83*  CREATININE 1.05 1.14  CALCIUM 10.1 10.4*    CBC: Recent Labs  Lab 02/21/21 0153  WBC 11.4*  NEUTROABS  8.9*  HGB 11.7*  HCT 38.7*  MCV 79.5*  PLT 214     CBG: Recent Labs  Lab 02/25/21 1136 02/25/21 1550 02/25/21 1934 02/25/21 2317 02/26/21 0321  GLUCAP 135* 162* 141* 222* 159*      Scheduled Meds:  amantadine  100 mg Per Tube  BID   amLODipine  2.5 mg Per Tube QHS   baclofen  10 mg Per Tube TID AC & HS   bethanechol  25 mg Per Tube TID   carvedilol  25 mg Per Tube BID WC   chlorhexidine gluconate (MEDLINE KIT)  15 mL Mouth Rinse BID   Chlorhexidine Gluconate Cloth  6 each Topical Q0600   cholestyramine  4 g Per Tube TID   cloNIDine  0.2 mg Per Tube BID   fiber  1 packet Per Tube BID   free water  200 mL Per Tube Q4H   heparin injection (subcutaneous)  5,000 Units Subcutaneous Q8H   hydrALAZINE  100 mg Per Tube Q8H   insulin aspart  0-20 Units Subcutaneous Q4H   insulin detemir  24 Units Subcutaneous BID   loperamide HCl  1 mg Per Tube Daily   mouth rinse  15 mL Mouth Rinse 10 times per day   nutrition supplement (JUVEN)  1 packet Per Tube BID BM   OLANZapine  2.5 mg Per Tube QHS   oxyCODONE  2.5 mg Per Tube Q8H   pantoprazole sodium  40 mg Per Tube Daily   scopolamine  1 patch Transdermal Q72H   Continuous Infusions:  feeding supplement (GLUCERNA 1.5 CAL) 1,000 mL (02/25/21 2349)    Principal Problem:   ICH (intracerebral hemorrhage) (HCC) Active Problems:   Malignant hypertension   Diabetes mellitus type 2 in obese (HCC)   Acute respiratory failure ([place])   Tracheostomy dependence ([place])   Pneumonia due to Serratia marcescens ([place])   MSSA (methicillin susceptible Staphylococcus aureus) pneumonia ([place])   Stage 3b chronic kidney disease (CKD) ([place])   Acute kidney injury ([place])   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 ([place])   Acute hypernatremia   Dysphagia due to recent stroke   Acute urinary retention   Respiration abnormal   Pressure injury of skin   Abdominal distention   Intractable hiccups   Pneumonia due to Klebsiella pneumoniae (HCC)   Swollen lip   Fever   Muscular hypertonicity   Tracheobronchitis, chronic ([place])   Acute bacterial sinusitis   Transaminitis   Consultants: Neurology PCCM Medicine  Procedures: Echocardiogram EEG Cortrack PEG  tube placement Tracheostomy tube placement  Antibiotics: Zosyn 12/15 through 12/21 Cefepime 12/24 through 12/27 Bactrim 12/27 >> 12/29 Cefepime 12/31 through 1/5   Time spent: 15 minutes    Erin Hearing ANP  [facility] 7 am - 330 pm/M-F for direct patient care and secure chat Please refer to Amion for contact info 58  days

## 2021-02-27 LAB — GLUCOSE, CAPILLARY
Glucose-Capillary: 151 mg/dL — ABNORMAL HIGH (ref 70–99)
Glucose-Capillary: 222 mg/dL — ABNORMAL HIGH (ref 70–99)
Glucose-Capillary: 156 mg/dL — ABNORMAL HIGH (ref 70–99)
Glucose-Capillary: 177 mg/dL — ABNORMAL HIGH (ref 70–99)
Glucose-Capillary: 141 mg/dL — ABNORMAL HIGH (ref 70–99)
Glucose-Capillary: 177 mg/dL — ABNORMAL HIGH (ref 70–99)

## 2021-02-27 LAB — HEPATIC FUNCTION PANEL
ALT: 155 U/L — ABNORMAL HIGH (ref 0–44)
AST: 56 U/L — ABNORMAL HIGH (ref 15–41)
Albumin: 2.2 g/dL — ABNORMAL LOW (ref 3.5–5.0)
Alkaline Phosphatase: 106 U/L (ref 38–126)
Bilirubin, Direct: <0.1 mg/dL (ref 0.0–0.2)
Total Bilirubin: 0.4 mg/dL (ref 0.3–1.2)
Total Protein: 7.6 g/dL (ref 6.5–8.1)

## 2021-02-27 NOTE — Progress Notes (Signed)
°   02/26/21 2008  Assess: MEWS Score  Temp (!) 97.4 F (36.3 C)  BP (!) 137/91  Pulse Rate 88  Resp 15  SpO2 97 %  O2 Device Room Air  Patient Activity (if Appropriate) In bed  O2 Flow Rate (L/min) 5 L/min  FiO2 (%) 21 %  Assess: MEWS Score  MEWS Temp 0  MEWS Systolic 0  MEWS Pulse 0  MEWS RR 0  MEWS LOC 2  MEWS Score 2  MEWS Score Color Yellow  Assess: if the MEWS score is Yellow or Red  Were vital signs taken at a resting state? Yes  Focused Assessment No change from prior assessment  Early Detection of Sepsis Score *See Row Information* Medium  MEWS guidelines implemented *See Row Information* No, previously yellow, continue vital signs every 4 hours

## 2021-02-27 NOTE — Progress Notes (Signed)
[facility] PROGRESS NOTE  Stephen Delgado GEZ:[id number] DOB: 1965/06/04 DOA: 12/30/2020 PCP: Patient, [name]   01/29/2021    01/29/2021                 02/04/2020  Status: Remains inpatient appropriate because:  Unsafe discharge plan.  Patient in persistent vegetative state per stroke needs placement at skilled nursing facility that is trach capable-medically stable for discharge noting trach has been in place now for 30 days  Barriers to discharge: Social: Current insurance coverage about to expire and he otherwise has no funding for long-term care.  Financial services clarifying regarding eligibility for disability and Medicaid  Clinical: Tracheostomy and PEG dependent with recurrent tracheobronchitis; remains a full code  Level of care:  Med-Surg    Code Status: Full code Family Communication:1/27 wife [name] at bedside DVT prophylaxis: SCDs COVID vaccination status: Unknown  Foley catheter: 13 French inserted on 12/17; changed on 12/24 POA: No  HPI: 56 y.o. male with PMH significant for DM2, HTN, CKD with questionable compliance to medications Patient presented to the ED on 12/30/2020 with complaint of headache, altered mental status   In the ED, he was hypertensive to 238/160, agitated, required restraints CT head showed large amount of intraventricular hemorrhage involving lateral/third/fourth ventricle, ICH and left basal ganglia as well as chronic microvascular ischemic changes. He was started on Cleviprex drip and admitted to neuro ICU   He subsequently had CT head repeated on 11/30, 12/1 and 12/3, all demonstrated unchanged intraparenchymal hemorrhage and intraventricular hemorrhage 12/3, MRI brain from also showed numerous small acute infarcts in both cerebral hemisphere with minimal improvement in the right superior cerebellum 12/3, intubated 12/13, tracheostomy 12/15, wean from vent to trach collar 12/16, PEG tube placement 12/16,  repeat MRI showed an increase in the size and number of acute infarcts in the bilateral hemispheres with increased involvement in the cerebellum bilaterally.   12/18, transferred from neuro service to [facility]  12/21, palliative care consulted.  Family wanted to continue aggressive care as full CODE STATUS. 12/23 developed Cheyne-Stokes respiratory pattern with respiratory alkalosis.  Transferred to ICU for short-term mechanical ventilation and was found to have new tracheobronchitis now on cefepime 12/25 back to progressive floor and to [facility]   His hospital course has been complicated by significant neurological impairment, aspiration events, AKI, hypernatremia, urinary retention. 1/9 trach changed to #6.0 Shiley cuffless--1/10 > 30 days since trach inserted.  Subjective: Eyes open.  Not tracking and not otherwise responding.  Wife at bedside.  Objective: Vitals:   02/27/21 0412 02/27/21 0532  BP: 122/87 136/80  Pulse: 87 87  Resp: 16 14  Temp:    SpO2: 96% 98%    Intake/Output Summary (Last 24 hours) at 02/27/2021 0729 Last data filed at 02/27/2021 0600 Gross per 24 hour  Intake 16980 ml  Output 1850 ml  Net 15130 ml   Filed Weights   02/09/21 0500 02/20/21 0347 02/26/21 0500  Weight: 80.1 kg 79.8 kg 78.5 kg    Exam:  Constitutional: Unresponsive, NAD Respiratory: #6.0 Shiley cuffless trach,  Lung sounds more coarse bilaterally,, weak wet sounding cough noted, trach with thick tan secretions .  Normal respiratory pattern today.  O2 sats between 97 and 99% Cardiovascular: S1-S2, NSR, normotensive -no bilateral lower extremity edema  Abdomen:  Abdomen soft and nontender.  PEG tube -normoactive bowels -rectal Foley in place draining dark thick brown rations to back Neurologic: Upper extremities are flaccid to PROM.-No further hypertonicity of lower  extremities elicited with PROM.  Psychiatric: Unresponsive   Assessment/Plan: Acute problems: Brain injury 2/2 acute intraparenchymal  and intraventricular hemorrhage/Bilateral multifocal ischemic infarcts  -Follow-up MRI on 12/16 demonstrated increase in size and number of acute infarcts  -No a/c intracranial hemorrhage. -Has intermittent episodes of [place] and Cheyne-Stokes respiratory patterns alternating with normal respiratory pattern -Continue baclofen for hypertonicity; continue scheduled Oxy IR for suspected underlying pain  - continue Symmetrel -LFTs were elevated so stopped daytime dose and decrease nighttime dose to 2.5.  Patient more awake.  LFTs continue to decrease  -No real progress per OT- plan is to have family at bedside for education then sign off  EOL discussion -per PMT 1/8 family requests that current level of care be continued including full CODE STATUS  Acute respiratory failure with hypoxia 2/2 recurrent Serratia tracheobronchitis (recent MSSA and serratia pneumonia; recent Klebsiella tracheobronchitis); -Completed Zosyn on 12/21 -12/23 recurrent acute tracheobronchitis-completed 7 days of Cefepime  -Continue scopolamine for excessive oral secretions to help minimize risk for aspiration -1/16 tracheal aspirate recultured-few Serratia but no clinical signs of infection so ID rec no anbxs -CT sinuses; rhinosinusitis worse on the left -1/23 wife states patient has history of chronic sinus issues  Loose stool/rectal tube -Continue Neutrisource fiber pack AND Imodium  Known malignant hypertension/EKG with LVH strain -Continue max dose Coreg, hydralazine, Norvasc and clonidine   AKI on CKD 3b/acute azotemia with mild hypernatremia -Baseline renal function: January 2022 BUN was 18 and creatinine 1.49 -Current creatinine 1.18 with a BUN of 37 -Sodium has been trending upward as has BUN and calcium.  On 1/25 increased free water to 200 cc every 4 hours   Non obstructive transaminitis -Recurrent issue throughout hospitalization -CT abdomen and pelvis 12/23 unremarkable no evidence of cirrhosis or  other biliary tree issues -LFTs had improved with mild elevation in ALT of 49 as of 1/9  -New worsening transaminitis with peak AST 74 and ALT 243.  By 1/20 8T down to 56 and ALT 155 after Zyprexa dose lower -Follow hepatic function panel daily and monitor for return to baseline -Hepatitis panel unremarkable  Type 2 diabetes mellitus -A1c 6.3 in 12/31/2020 -Increase Levemir to 24 units BID; continue SSI every 4 hours -CBGs well controlled   Protein calorie malnutrition/dysphagia Nutrition Problem: Inadequate oral intake Etiology: lethargy/confusion, dysphagia Signs/Symptoms: NPO status Interventions: Tube feeding via PEG Body mass index is 23.46 kg/m.    Acute urinary retention  -Continue Urecholine -Foley catheter discontinued on 1/11 and patient voiding easily      Data Reviewed: Basic Metabolic Panel: Recent Labs  Lab 02/21/21 0153 02/25/21 0344  NA 148* 152*  K 4.4 4.4  CL 111 113*  CO2 28 26  GLUCOSE 302* 179*  BUN 58* 83*  CREATININE 1.05 1.14  CALCIUM 10.1 10.4*    CBC: Recent Labs  Lab 02/21/21 0153  WBC 11.4*  NEUTROABS 8.9*  HGB 11.7*  HCT 38.7*  MCV 79.5*  PLT 214     CBG: Recent Labs  Lab 02/26/21 1125 02/26/21 1515 02/26/21 2043 02/27/21 0006 02/27/21 0416  GLUCAP 197* 107* 188* 222* 151*      Scheduled Meds:  amantadine  100 mg Per Tube BID   amLODipine  2.5 mg Per Tube QHS   baclofen  10 mg Per Tube TID AC & HS   bethanechol  25 mg Per Tube TID   carvedilol  25 mg Per Tube BID WC   chlorhexidine gluconate (MEDLINE KIT)  15 mL Mouth Rinse BID  Chlorhexidine Gluconate Cloth  6 each Topical Q0600   cholestyramine  4 g Per Tube TID   cloNIDine  0.2 mg Per Tube BID   fiber  1 packet Per Tube BID   free water  200 mL Per Tube Q4H   heparin injection (subcutaneous)  5,000 Units Subcutaneous Q8H   hydrALAZINE  100 mg Per Tube Q8H   insulin aspart  0-20 Units Subcutaneous Q4H   insulin detemir  24 Units Subcutaneous BID    loperamide HCl  1 mg Per Tube Daily   mouth rinse  15 mL Mouth Rinse 10 times per day   nutrition supplement (JUVEN)  1 packet Per Tube BID BM   OLANZapine  2.5 mg Per Tube QHS   oxyCODONE  2.5 mg Per Tube Q8H   pantoprazole sodium  40 mg Per Tube Daily   scopolamine  1 patch Transdermal Q72H   Continuous Infusions:  feeding supplement (GLUCERNA 1.5 CAL) 1,000 mL (02/25/21 2349)    Principal Problem:   ICH (intracerebral hemorrhage) ([place]) Active Problems:   Malignant hypertension   Diabetes mellitus type 2 in obese ([place])   Acute respiratory failure ([place])   Tracheostomy dependence ([place])   Pneumonia due to Serratia marcescens ([place])   MSSA (methicillin susceptible Staphylococcus aureus) pneumonia ([place])   Stage 3b chronic kidney disease (CKD) ([place])   Acute kidney injury ([place])   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 ([place])   Acute hypernatremia   Dysphagia due to recent stroke   Acute urinary retention   Respiration abnormal   Pressure injury of skin   Abdominal distention   Intractable hiccups   Pneumonia due to Klebsiella pneumoniae (HCC)   Swollen lip   Fever   Muscular hypertonicity   Tracheobronchitis, chronic ([place])   Acute bacterial sinusitis   Transaminitis   Consultants: Neurology PCCM Medicine  Procedures: Echocardiogram EEG Cortrack PEG tube placement Tracheostomy tube placement  Antibiotics: Zosyn 12/15 through 12/21 Cefepime 12/24 through 12/27 Bactrim 12/27 >> 12/29 Cefepime 12/31 through 1/5   Time spent: 15 minutes    Erin Hearing ANP  [facility] 7 am - 330 pm/M-F for direct patient care and secure chat Please refer to Amion for contact info 59  days

## 2021-02-28 LAB — HEPATIC FUNCTION PANEL
ALT: 172 U/L — ABNORMAL HIGH (ref 0–44)
AST: 74 U/L — ABNORMAL HIGH (ref 15–41)
Albumin: 2.2 g/dL — ABNORMAL LOW (ref 3.5–5.0)
Alkaline Phosphatase: 113 U/L (ref 38–126)
Bilirubin, Direct: <0.1 mg/dL (ref 0.0–0.2)
Total Bilirubin: 0.3 mg/dL (ref 0.3–1.2)
Total Protein: 7.4 g/dL (ref 6.5–8.1)

## 2021-02-28 LAB — CBC WITH DIFFERENTIAL/PLATELET
Abs Immature Granulocytes: 0 10*3/uL (ref 0.00–0.07)
Basophils Absolute: 0 10*3/uL (ref 0.0–0.1)
Basophils Relative: 0 %
Eosinophils Absolute: 0.7 10*3/uL — ABNORMAL HIGH (ref 0.0–0.5)
Eosinophils Relative: 4 %
HCT: 35 % — ABNORMAL LOW (ref 39.0–52.0)
Hemoglobin: 10.5 g/dL — ABNORMAL LOW (ref 13.0–17.0)
Lymphocytes Relative: 11 %
Lymphs Abs: 1.8 10*3/uL (ref 0.7–4.0)
MCH: 23.8 pg — ABNORMAL LOW (ref 26.0–34.0)
MCHC: 30 g/dL (ref 30.0–36.0)
MCV: 79.4 fL — ABNORMAL LOW (ref 80.0–100.0)
Monocytes Absolute: 0.7 10*3/uL (ref 0.1–1.0)
Monocytes Relative: 4 %
Neutro Abs: 13.2 10*3/uL — ABNORMAL HIGH (ref 1.7–7.7)
Neutrophils Relative %: 81 %
Platelets: 223 10*3/uL (ref 150–400)
RBC: 4.41 MIL/uL (ref 4.22–5.81)
RDW: 17.4 % — ABNORMAL HIGH (ref 11.5–15.5)
WBC: 16.3 10*3/uL — ABNORMAL HIGH (ref 4.0–10.5)
nRBC: 0 % (ref 0.0–0.2)
nRBC: 0 /100 WBC

## 2021-02-28 LAB — BASIC METABOLIC PANEL
Anion gap: 11 (ref 5–15)
BUN: 70 mg/dL — ABNORMAL HIGH (ref 6–20)
CO2: 26 mmol/L (ref 22–32)
Calcium: 10.2 mg/dL (ref 8.9–10.3)
Chloride: 114 mmol/L — ABNORMAL HIGH (ref 98–111)
Creatinine, Ser: 1.05 mg/dL (ref 0.61–1.24)
GFR, Estimated: >60 mL/min (ref >60)
Glucose, Bld: 161 mg/dL — ABNORMAL HIGH (ref 70–99)
Potassium: 3.9 mmol/L (ref 3.5–5.1)
Sodium: 151 mmol/L — ABNORMAL HIGH (ref 135–145)

## 2021-02-28 LAB — GLUCOSE, CAPILLARY
Glucose-Capillary: 107 mg/dL — ABNORMAL HIGH (ref 70–99)
Glucose-Capillary: 126 mg/dL — ABNORMAL HIGH (ref 70–99)
Glucose-Capillary: 136 mg/dL — ABNORMAL HIGH (ref 70–99)
Glucose-Capillary: 142 mg/dL — ABNORMAL HIGH (ref 70–99)
Glucose-Capillary: 142 mg/dL — ABNORMAL HIGH (ref 70–99)
Glucose-Capillary: 182 mg/dL — ABNORMAL HIGH (ref 70–99)
Glucose-Capillary: 183 mg/dL — ABNORMAL HIGH (ref 70–99)

## 2021-02-28 MED ORDER — BETHANECHOL CHLORIDE 10 MG PO TABS
10.0000 mg | ORAL_TABLET | Freq: Three times a day (TID) | ORAL | Status: DC
  Administered 2021-02-28 – 2021-03-21 (×64): 10 mg
  Filled 2021-02-28 (×66): qty 1

## 2021-02-28 MED ORDER — FREE WATER
250.0000 mL | Status: DC
  Administered 2021-02-28 – 2021-03-19 (×116): 250 mL

## 2021-02-28 NOTE — Progress Notes (Signed)
PROGRESS NOTE  Ketan Renz Ciampi AVW:[id number] DOB: [birth year]   PCP: Patient, [name]  Patient is from: Home  DOA: 12/30/2020 LOS: 67  Chief complaints:  Chief Complaint  Patient presents with   Altered Mental Status     Brief Narrative / Interim history: 56 year old M with PMH of DM-2, HTN and CKD presented to ED on 12/30/2020 with headache and altered mental status and admitted for hypertensive emergency with [place] and left BG stroke.  He was admitted to neuro ICU on Cleviprex drip.  Subsequent CT head on 11/30, 12/1 and 12/3 with unchanged ICH and IVH.  MRI brain on 12/3 showed numerous small acute infarcts in both cerebral hemisphere with minimal improvement in the right superior cerebellum.  Repeat MRI in 12/16 showed an increase in size and number of acute infarcts in the bilateral hemisphere with increased involvement in the cerebellum bilaterally.  Patient was intubated on 12/3 and had tracheostomy on 12/13.  Weaned off vent to trach collar on 12/15.  Had a PEG tube on 12/16.  [facility] on 12/18 but developed Cheyne-Stokes respiratory pattern with respiratory alkalosis requiring short-term mechanical ventilation and ICU stay.  He was found to have new tracheobronchitis now on cefepime.  Transferred back to [facility] service on 12/25.  Hospital course has been complicated by significant neurological impairment, aspiration events, AKI, hypernatremia, urinary retention.  1/9 trach changed to #6.0 Shiley cuffless--1/10 > 30 days since trach inserted.  Subjective: Seen and examined earlier this morning.  No major events overnight of this morning.  Remains in semivegetative state.  Reportedly had 13 beats of NSVT overnight.   Objective: Vitals:   02/28/21 0807 02/28/21 0808 02/28/21 1101 02/28/21 1133  BP: (!) 154/90 (!) 154/90  120/71  Pulse: 91 91  85  Resp: '12 12  16  ' Temp: 98.6 F (37 C) 98.6 F (37 C)  98.6 F (37 C)  TempSrc:  Axillary Oral  Axillary  SpO2: 99% 98% 96% 96%  Weight:      Height:        Examination:  GENERAL: No apparent distress.  Nontoxic. HEENT: MMM.  Seems to be drooling.  PERRL. NECK: Tracheostomy in place. RESP: 98% on 5 L / 21% FiO2.  No IWOB.  Fair aeration bilaterally. CVS:  RRR. Heart sounds normal.  ABD/GI/GU: BS+. Abd soft, NTND.  G-tube in place. MSK/EXT:  Moves extremities. No apparent deformity. No edema.  SKIN: no apparent skin lesion or wound NEURO: Awake but not responsive to voice.  Does not follow command.  PERRL.  Agitated in response to noxious stimuli.  Further exam limited due to patient's mental status. PSYCH: Calm.  No distress or agitation.  Procedures:  See above  Microbiology summarized: 11/30-COVID-19, influenza and MRSA PCR negative. 12/3-respiratory culture with staph auris 12/14-blood cultures NGTD 12/15-respiratory culture with staph auris and Serratia marcescens 12/24 and 12/30-respiratory culture with Serratia marcescens and Klebsiella pneumonia 12/29-urine culture negative 1/16-respiratory culture with Serratia marcescens  Assessment & Plan: Acute encephalopathy in the setting of acute IVH and IPH and bilateral multifocal ischemic CVA in the setting of hypertensive emergency.  BP elevated to 238/160 on arrival. -Patient remains in semivegetative state-only some agitation in response to noxious stimuli. -Palliative care consulted-remains full code with full scope of care per family wishes -Manage hypertension as below -Supportive care  Acute respiratory failure with hypoxia 2/2 recurrent Serratia tracheobronchitis-respiratory culture with persistent Serratia marcescens.  Not sure if this is a true infection  or colonization at this point.  Had multiple rounds of antibiotics.  Now with leukocytosis without clear source.  Not febrile.  Obviously, very high risk for aspiration. -Aspiration precautions and pulmonary hygiene   Hypertensive emergency: BP  elevated to 238/160 on arrival.  Normotensive now. -Continue current regimen-clonidine, amlodipine, Coreg and labetalol as needed  AKI/azotemia: AKI resolved.  BUN remains elevated Recent Labs    02/02/21 0232 02/03/21 0751 02/06/21 0056 02/09/21 0434 02/14/21 0352 02/17/21 0805 02/19/21 0341 02/21/21 0153 02/25/21 0344 02/28/21 0141  BUN 37* 44* 52* 45* 50* 61* 63* 58* 83* 70*  CREATININE 1.18 1.16 1.05 1.00 1.01 1.17 1.06 1.05 1.14 1.05  -Monitor intermittently  Hypernatremia: Na 151. -Increase free water from 200-250cc every 4 hours -Recheck in the morning   Non obstructive transaminitis: Stable. Recent Labs  Lab 02/25/21 0344 02/26/21 0408 02/27/21 0322 02/28/21 0141  AST 74* 63* 56* 74*  ALT 243* 204* 155* 172*  ALKPHOS 110 119 106 113  BILITOT 0.4 0.3 0.4 0.3  PROT 7.6 7.4 7.6 7.4  ALBUMIN 2.5* 2.3* 2.2* 2.2*  -Continue monitoring   Controlled DM-2 with hyperglycemia: A1c 6.3% on 12/31/2020.  Does not seem to be on medication prior to presentation. Recent Labs  Lab 02/27/21 2012 02/28/21 0008 02/28/21 0437 02/28/21 0811 02/28/21 1151  GLUCAP 177* 182* 126* 136* 183*  -Continue Levemir 24 units twice daily -Continue SSI-resistant   Acute urinary retention: Resolved. -Reduce Urecholine  Leukocytosis: Unclear source.  No fever. -Continue monitoring   Dysphagia/inadequate oral intake Body mass index is 23.33 kg/m. Nutrition Problem: Inadequate oral intake Etiology: lethargy/confusion, dysphagia Signs/Symptoms: NPO status Interventions: Tube feeding  Pressure skin injury: Pressure Injury 01/23/21 Anus Medial Stage 2 -  Partial thickness loss of dermis presenting as a shallow open injury with a red, pink wound bed without slough. Two red open areas approximately 0.5x1cm related to flexiseal (Active)  01/23/21 1300  Location: Anus  Location Orientation: Medial  Staging: Stage 2 -  Partial thickness loss of dermis presenting as a shallow open injury  with a red, pink wound bed without slough.  Wound Description (Comments): Two red open areas approximately 0.5x1cm related to flexiseal (Foam placed)  Present on Admission: No (Present on arrival to 4NICU)   DVT prophylaxis:  heparin injection 5,000 Units Start: 01/05/21 1400 SCD's Start: 12/30/20 1908  Code Status: Full code Family Communication: Patient and/or Therapist, sports. Available if any question.  Level of care: Med-Surg Status is: Inpatient  Remains inpatient appropriate because: Lack of safe disposition/SNF       Consultants:  Pulmonology Neurology   Sch Meds:  Scheduled Meds:  amantadine  100 mg Per Tube BID   amLODipine  2.5 mg Per Tube QHS   baclofen  10 mg Per Tube TID AC & HS   bethanechol  10 mg Per Tube TID   carvedilol  25 mg Per Tube BID WC   chlorhexidine gluconate (MEDLINE KIT)  15 mL Mouth Rinse BID   Chlorhexidine Gluconate Cloth  6 each Topical Q0600   cholestyramine  4 g Per Tube TID   cloNIDine  0.2 mg Per Tube BID   fiber  1 packet Per Tube BID   free water  250 mL Per Tube Q4H   heparin injection (subcutaneous)  5,000 Units Subcutaneous Q8H   hydrALAZINE  100 mg Per Tube Q8H   insulin aspart  0-20 Units Subcutaneous Q4H   insulin detemir  24 Units Subcutaneous BID   loperamide HCl  1 mg  Per Tube Daily   mouth rinse  15 mL Mouth Rinse 10 times per day   nutrition supplement (JUVEN)  1 packet Per Tube BID BM   OLANZapine  2.5 mg Per Tube QHS   oxyCODONE  2.5 mg Per Tube Q8H   pantoprazole sodium  40 mg Per Tube Daily   scopolamine  1 patch Transdermal Q72H   Continuous Infusions:  feeding supplement (GLUCERNA 1.5 CAL) 1,000 mL (02/28/21 1006)   PRN Meds:.acetaminophen **OR** acetaminophen (TYLENOL) oral liquid 160 mg/5 mL **OR** acetaminophen, albuterol, docusate, guaiFENesin, hydrALAZINE, influenza vac split quadrivalent PF, labetalol, ondansetron (ZOFRAN) IV, polyethylene glycol  Antimicrobials: Anti-infectives (From admission, onward)     Start     Dose/Rate Route Frequency Ordered Stop   02/03/21 2200  ceFEPIme (MAXIPIME) 2 g in sodium chloride 0.9 % 100 mL IVPB        2 g 200 mL/hr over 30 Minutes Intravenous Every 8 hours 02/03/21 1153 02/05/21 2142   01/30/21 1000  ceFEPIme (MAXIPIME) 2 g in sodium chloride 0.9 % 100 mL IVPB  Status:  Discontinued        2 g 200 mL/hr over 30 Minutes Intravenous Every 12 hours 01/30/21 0832 02/03/21 1153   01/27/21 1900  sulfamethoxazole-trimethoprim (BACTRIM DS) 800-160 MG per tablet 1 tablet  Status:  Discontinued        1 tablet Per Tube Every 12 hours 01/27/21 1403 01/30/21 0808   01/24/21 1100  ceFEPIme (MAXIPIME) 2 g in sodium chloride 0.9 % 100 mL IVPB  Status:  Discontinued        2 g 200 mL/hr over 30 Minutes Intravenous Every 8 hours 01/24/21 1025 01/27/21 1403   01/15/21 1630  piperacillin-tazobactam (ZOSYN) IVPB 3.375 g        3.375 g 12.5 mL/hr over 240 Minutes Intravenous Every 8 hours 01/15/21 0943 01/22/21 0400   01/15/21 1030  piperacillin-tazobactam (ZOSYN) IVPB 3.375 g        3.375 g 100 mL/hr over 30 Minutes Intravenous  Once 01/15/21 0943 01/15/21 1035   01/05/21 1600  ceFAZolin (ANCEF) IVPB 2g/100 mL premix        2 g 200 mL/hr over 30 Minutes Intravenous Every 8 hours 01/05/21 0853 01/10/21 0848   01/03/21 0830  piperacillin-tazobactam (ZOSYN) IVPB 3.375 g  Status:  Discontinued        3.375 g 12.5 mL/hr over 240 Minutes Intravenous Every 8 hours 01/03/21 0800 01/05/21 0853        I have personally reviewed the following labs and images: CBC: Recent Labs  Lab 02/28/21 0141  WBC 16.3*  NEUTROABS 13.2*  HGB 10.5*  HCT 35.0*  MCV 79.4*  PLT 223   BMP &GFR Recent Labs  Lab 02/25/21 0344 02/28/21 0141  NA 152* 151*  K 4.4 3.9  CL 113* 114*  CO2 26 26  GLUCOSE 179* 161*  BUN 83* 70*  CREATININE 1.14 1.05  CALCIUM 10.4* 10.2   Estimated Creatinine Clearance: 85.2 mL/min (by C-G formula based on SCr of 1.05 mg/dL). Liver & Pancreas: Recent  Labs  Lab 02/25/21 0344 02/26/21 0408 02/27/21 0322 02/28/21 0141  AST 74* 63* 56* 74*  ALT 243* 204* 155* 172*  ALKPHOS 110 119 106 113  BILITOT 0.4 0.3 0.4 0.3  PROT 7.6 7.4 7.6 7.4  ALBUMIN 2.5* 2.3* 2.2* 2.2*   No results for input(s): LIPASE, AMYLASE in the last 168 hours. No results for input(s): AMMONIA in the last 168 hours. Diabetic: No results for  input(s): HGBA1C in the last 72 hours. Recent Labs  Lab 02/27/21 2012 02/28/21 0008 02/28/21 0437 02/28/21 0811 02/28/21 1151  GLUCAP 177* 182* 126* 136* 183*   Cardiac Enzymes: No results for input(s): CKTOTAL, CKMB, CKMBINDEX, TROPONINI in the last 168 hours. No results for input(s): PROBNP in the last 8760 hours. Coagulation Profile: No results for input(s): INR, PROTIME in the last 168 hours. Thyroid Function Tests: No results for input(s): TSH, T4TOTAL, FREET4, T3FREE, THYROIDAB in the last 72 hours. Lipid Profile: No results for input(s): CHOL, HDL, LDLCALC, TRIG, CHOLHDL, LDLDIRECT in the last 72 hours. Anemia Panel: No results for input(s): VITAMINB12, FOLATE, FERRITIN, TIBC, IRON, RETICCTPCT in the last 72 hours. Urine analysis:    Component Value Date/Time   COLORURINE YELLOW 01/14/2021 2116   APPEARANCEUR CLEAR 01/14/2021 2116   APPEARANCEUR Clear 02/26/2016 1648   LABSPEC 1.020 01/14/2021 2116   PHURINE 5.5 01/14/2021 2116   GLUCOSEU NEGATIVE 01/14/2021 2116   HGBUR LARGE (A) 01/14/2021 2116   BILIRUBINUR NEGATIVE 01/14/2021 2116   BILIRUBINUR Negative 02/26/2016 [place] 01/14/2021 2116   PROTEINUR 30 (A) 01/14/2021 2116   NITRITE NEGATIVE 01/14/2021 2116   LEUKOCYTESUR NEGATIVE 01/14/2021 2116   Sepsis Labs: Invalid input(s): PROCALCITONIN, [place]  Microbiology: No results found for this or any previous visit (from the past 240 hour(s)).  Radiology Studies: No results found.    Taishawn Smaldone T. [place]  If 7PM-7AM, please contact  night-coverage [email or web address] 02/28/2021, 12:02 PM

## 2021-02-28 NOTE — Progress Notes (Signed)
Patient noted with a 13 beat run of Vtach. Dr. Rachael Darby notified via [employer] communication. New order received for BMP.

## 2021-03-01 LAB — HEPATIC FUNCTION PANEL
ALT: 159 U/L — ABNORMAL HIGH (ref 0–44)
AST: 68 U/L — ABNORMAL HIGH (ref 15–41)
Albumin: 2.2 g/dL — ABNORMAL LOW (ref 3.5–5.0)
Alkaline Phosphatase: 99 U/L (ref 38–126)
Bilirubin, Direct: <0.1 mg/dL (ref 0.0–0.2)
Total Bilirubin: 0.3 mg/dL (ref 0.3–1.2)
Total Protein: 7.1 g/dL (ref 6.5–8.1)

## 2021-03-01 LAB — GLUCOSE, CAPILLARY
Glucose-Capillary: 118 mg/dL — ABNORMAL HIGH (ref 70–99)
Glucose-Capillary: 114 mg/dL — ABNORMAL HIGH (ref 70–99)
Glucose-Capillary: 148 mg/dL — ABNORMAL HIGH (ref 70–99)
Glucose-Capillary: 151 mg/dL — ABNORMAL HIGH (ref 70–99)
Glucose-Capillary: 122 mg/dL — ABNORMAL HIGH (ref 70–99)
Glucose-Capillary: 116 mg/dL — ABNORMAL HIGH (ref 70–99)

## 2021-03-01 NOTE — Progress Notes (Signed)
PROGRESS NOTE  Stephen Delgado MAY:[id number] DOB: 1965-09-12   PCP: Patient, [name]  Patient is from: Home  DOA: 12/30/2020 LOS: 3  Chief complaints:  Chief Complaint  Patient presents with   Altered Mental Status     Brief Narrative / Interim history: 56 year old M with PMH of DM-2, HTN and CKD presented to ED on 12/30/2020 with headache and altered mental status and admitted for hypertensive emergency with [place] and left BG stroke.  He was admitted to neuro ICU on Cleviprex drip.  Subsequent CT head on 11/30, 12/1 and 12/3 with unchanged ICH and IVH.  MRI brain on 12/3 showed numerous small acute infarcts in both cerebral hemisphere with minimal improvement in the right superior cerebellum.  Repeat MRI in 12/16 showed an increase in size and number of acute infarcts in the bilateral hemisphere with increased involvement in the cerebellum bilaterally.  Patient was intubated on 12/3 and had tracheostomy on 12/13.  Weaned off vent to trach collar on 12/15.  Had a PEG tube on 12/16.  Transferred to [facility] hospitalist service on 12/18 but developed Cheyne-Stokes respiratory pattern with respiratory alkalosis requiring short-term mechanical ventilation and ICU stay.  He was found to have new tracheobronchitis now on cefepime.  Transferred back to [facility] service on 12/25.  Hospital course has been complicated by significant neurological impairment, aspiration events, AKI, hypernatremia, urinary retention.  1/9 trach changed to #6.0 [place] cuffless--1/10 > 30 days since trach inserted.  Subjective: Seen and examined earlier this morning.  No major events overnight of this morning.  Remains in semivegetative state.    Objective: Vitals:   03/01/21 0401 03/01/21 0416 03/01/21 0713 03/01/21 0733  BP:    (!) 144/79  Pulse: 85  85 87  Resp: '10 16  14  ' Temp:    98 F (36.7 C)  TempSrc:    Axillary  SpO2: 95%  96% 96%  Weight:      Height:        Examination:  GENERAL:  No apparent distress.  Nontoxic. HEENT: MMM.  PERRL. NECK: Tracheostomy in place. RESP: 96% on 5 L / 21% FiO2.  No IWOB.  Fair aeration bilaterally. CVS:  RRR. Heart sounds normal.  ABD/GI/GU: BS+. Abd soft, NTND.  G-tube in place. MSK/EXT: No apparent deformity.  No edema. SKIN: no apparent skin lesion or wound NEURO: Looks awake.  Does not respond to voice.  Does not follow command.  PERRL.  No facial asymmetry. PSYCH: Calm.  No distress or agitation.  Procedures:  See above  Microbiology summarized: 11/30-COVID-19, influenza and MRSA PCR negative. 12/3-respiratory culture with staph auris 12/14-blood cultures NGTD 12/15-respiratory culture with staph auris and Serratia marcescens 12/24 and 12/30-respiratory culture with Serratia marcescens and Klebsiella pneumonia 12/29-urine culture negative 1/16-respiratory culture with Serratia marcescens  Assessment & Plan: Acute encephalopathy in the setting of acute IVH and IPH and bilateral multifocal ischemic CVA in the setting of hypertensive emergency.  BP elevated to 238/160 on arrival. -Patient remains in semivegetative state-only some agitation in response to noxious stimuli. -Palliative care consulted-remains full code with full scope of care per family wishes -Manage hypertension as below -Supportive care  Acute respiratory failure with hypoxia 2/2 recurrent Serratia tracheobronchitis-respiratory culture with persistent Serratia marcescens.  Not sure if this is a true infection or colonization at this point.  Had multiple rounds of antibiotics.  Now with leukocytosis without clear source.  Not febrile.  Obviously, very high risk for aspiration. -Aspiration precautions and pulmonary  hygiene   Hypertensive emergency: BP elevated to 238/160 on arrival.  Normotensive now. -Continue current regimen-clonidine, amlodipine, Coreg and labetalol as needed  AKI/azotemia: AKI resolved.  BUN remains elevated Recent Labs    02/02/21 0232  02/03/21 0751 02/06/21 0056 02/09/21 0434 02/14/21 0352 02/17/21 0805 02/19/21 0341 02/21/21 0153 02/25/21 0344 02/28/21 0141  BUN 37* 44* 52* 45* 50* 61* 63* 58* 83* 70*  CREATININE 1.18 1.16 1.05 1.00 1.01 1.17 1.06 1.05 1.14 1.05  -Monitor intermittently  Hypernatremia: Na 151. -Increased free water from 200-250cc every 4 hours -Recheck in the morning   Non obstructive transaminitis: Stable.  HIV and acute hepatitis panel negative. Recent Labs  Lab 02/25/21 0344 02/26/21 0408 02/27/21 0322 02/28/21 0141 03/01/21 0048  AST 74* 63* 56* 74* 68*  ALT 243* 204* 155* 172* 159*  ALKPHOS 110 119 106 113 99  BILITOT 0.4 0.3 0.4 0.3 0.3  PROT 7.6 7.4 7.6 7.4 7.1  ALBUMIN 2.5* 2.3* 2.2* 2.2* 2.2*  -Continue monitoring   Controlled DM-2 with hyperglycemia: A1c 6.3% on 12/31/2020.  Does not seem to be on medication prior to presentation. Recent Labs  Lab 02/28/21 1609 02/28/21 1942 02/28/21 2324 03/01/21 0406 03/01/21 0827  GLUCAP 142* 107* 142* 118* 114*  -Continue Levemir 24 units twice daily -Continue SSI-resistant   Acute urinary retention: Resolved. -Reduced Urecholine to 10 mg 3 times daily in the setting of diarrhea  Leukocytosis: Unclear source.  No fever. -Continue monitoring   Dysphagia/inadequate oral intake Body mass index is 23.33 kg/m. Nutrition Problem: Inadequate oral intake Etiology: lethargy/confusion, dysphagia Signs/Symptoms: NPO status Interventions: Tube feeding  Pressure skin injury: Pressure Injury 01/23/21 Anus Medial Stage 2 -  Partial thickness loss of dermis presenting as a shallow open injury with a red, pink wound bed without slough. Two red open areas approximately 0.5x1cm related to flexiseal (Active)  01/23/21 1300  Location: Anus  Location Orientation: Medial  Staging: Stage 2 -  Partial thickness loss of dermis presenting as a shallow open injury with a red, pink wound bed without slough.  Wound Description (Comments): Two red  open areas approximately 0.5x1cm related to flexiseal (Foam placed)  Present on Admission: No (Present on arrival to 4NICU)   DVT prophylaxis:  heparin injection 5,000 Units Start: 01/05/21 1400 SCD's Start: 12/30/20 1908  Code Status: Full code Family Communication: Patient and/or Therapist, sports. Available if any question.  Level of care: Med-Surg Status is: Inpatient  Remains inpatient appropriate because: Lack of safe disposition/SNF       Consultants:  Pulmonology-signed off Neurology-signed off   Sch Meds:  Scheduled Meds:  amantadine  100 mg Per Tube BID   amLODipine  2.5 mg Per Tube QHS   baclofen  10 mg Per Tube TID AC & HS   bethanechol  10 mg Per Tube TID   carvedilol  25 mg Per Tube BID WC   chlorhexidine gluconate (MEDLINE KIT)  15 mL Mouth Rinse BID   Chlorhexidine Gluconate Cloth  6 each Topical Q0600   cholestyramine  4 g Per Tube TID   cloNIDine  0.2 mg Per Tube BID   fiber  1 packet Per Tube BID   free water  250 mL Per Tube Q4H   heparin injection (subcutaneous)  5,000 Units Subcutaneous Q8H   hydrALAZINE  100 mg Per Tube Q8H   insulin aspart  0-20 Units Subcutaneous Q4H   insulin detemir  24 Units Subcutaneous BID   loperamide HCl  1 mg Per Tube Daily  mouth rinse  15 mL Mouth Rinse 10 times per day   nutrition supplement (JUVEN)  1 packet Per Tube BID BM   OLANZapine  2.5 mg Per Tube QHS   oxyCODONE  2.5 mg Per Tube Q8H   pantoprazole sodium  40 mg Per Tube Daily   scopolamine  1 patch Transdermal Q72H   Continuous Infusions:  feeding supplement (GLUCERNA 1.5 CAL) 1,000 mL (02/28/21 1006)   PRN Meds:.acetaminophen **OR** acetaminophen (TYLENOL) oral liquid 160 mg/5 mL **OR** acetaminophen, albuterol, docusate, guaiFENesin, hydrALAZINE, influenza vac split quadrivalent PF, labetalol, ondansetron (ZOFRAN) IV, polyethylene glycol  Antimicrobials: Anti-infectives (From admission, onward)    Start     Dose/Rate Route Frequency Ordered Stop   02/03/21 2200   ceFEPIme (MAXIPIME) 2 g in sodium chloride 0.9 % 100 mL IVPB        2 g 200 mL/hr over 30 Minutes Intravenous Every 8 hours 02/03/21 1153 02/05/21 2142   01/30/21 1000  ceFEPIme (MAXIPIME) 2 g in sodium chloride 0.9 % 100 mL IVPB  Status:  Discontinued        2 g 200 mL/hr over 30 Minutes Intravenous Every 12 hours 01/30/21 0832 02/03/21 1153   01/27/21 1900  sulfamethoxazole-trimethoprim (BACTRIM DS) 800-160 MG per tablet 1 tablet  Status:  Discontinued        1 tablet Per Tube Every 12 hours 01/27/21 1403 01/30/21 0808   01/24/21 1100  ceFEPIme (MAXIPIME) 2 g in sodium chloride 0.9 % 100 mL IVPB  Status:  Discontinued        2 g 200 mL/hr over 30 Minutes Intravenous Every 8 hours 01/24/21 1025 01/27/21 1403   01/15/21 1630  piperacillin-tazobactam (ZOSYN) IVPB 3.375 g        3.375 g 12.5 mL/hr over 240 Minutes Intravenous Every 8 hours 01/15/21 0943 01/22/21 0400   01/15/21 1030  piperacillin-tazobactam (ZOSYN) IVPB 3.375 g        3.375 g 100 mL/hr over 30 Minutes Intravenous  Once 01/15/21 0943 01/15/21 1035   01/05/21 1600  ceFAZolin (ANCEF) IVPB 2g/100 mL premix        2 g 200 mL/hr over 30 Minutes Intravenous Every 8 hours 01/05/21 0853 01/10/21 0848   01/03/21 0830  piperacillin-tazobactam (ZOSYN) IVPB 3.375 g  Status:  Discontinued        3.375 g 12.5 mL/hr over 240 Minutes Intravenous Every 8 hours 01/03/21 0800 01/05/21 0853        I have personally reviewed the following labs and images: CBC: Recent Labs  Lab 02/28/21 0141  WBC 16.3*  NEUTROABS 13.2*  HGB 10.5*  HCT 35.0*  MCV 79.4*  PLT 223   BMP &GFR Recent Labs  Lab 02/25/21 0344 02/28/21 0141  NA 152* 151*  K 4.4 3.9  CL 113* 114*  CO2 26 26  GLUCOSE 179* 161*  BUN 83* 70*  CREATININE 1.14 1.05  CALCIUM 10.4* 10.2   Estimated Creatinine Clearance: 85.2 mL/min (by C-G formula based on SCr of 1.05 mg/dL). Liver & Pancreas: Recent Labs  Lab 02/25/21 0344 02/26/21 0408 02/27/21 0322  02/28/21 0141 03/01/21 0048  AST 74* 63* 56* 74* 68*  ALT 243* 204* 155* 172* 159*  ALKPHOS 110 119 106 113 99  BILITOT 0.4 0.3 0.4 0.3 0.3  PROT 7.6 7.4 7.6 7.4 7.1  ALBUMIN 2.5* 2.3* 2.2* 2.2* 2.2*   No results for input(s): LIPASE, AMYLASE in the last 168 hours. No results for input(s): AMMONIA in the last 168 hours. Diabetic:  No results for input(s): HGBA1C in the last 72 hours. Recent Labs  Lab 02/28/21 1609 02/28/21 1942 02/28/21 2324 03/01/21 0406 03/01/21 0827  GLUCAP 142* 107* 142* 118* 114*   Cardiac Enzymes: No results for input(s): CKTOTAL, CKMB, CKMBINDEX, TROPONINI in the last 168 hours. No results for input(s): PROBNP in the last 8760 hours. Coagulation Profile: No results for input(s): INR, PROTIME in the last 168 hours. Thyroid Function Tests: No results for input(s): TSH, T4TOTAL, FREET4, T3FREE, THYROIDAB in the last 72 hours. Lipid Profile: No results for input(s): CHOL, HDL, LDLCALC, TRIG, CHOLHDL, LDLDIRECT in the last 72 hours. Anemia Panel: No results for input(s): VITAMINB12, FOLATE, FERRITIN, TIBC, IRON, RETICCTPCT in the last 72 hours. Urine analysis:    Component Value Date/Time   COLORURINE YELLOW 01/14/2021 2116   APPEARANCEUR CLEAR 01/14/2021 2116   APPEARANCEUR Clear 02/26/2016 1648   LABSPEC 1.020 01/14/2021 2116   PHURINE 5.5 01/14/2021 2116   GLUCOSEU NEGATIVE 01/14/2021 2116   HGBUR LARGE (A) 01/14/2021 2116   BILIRUBINUR NEGATIVE 01/14/2021 2116   BILIRUBINUR Negative 02/26/2016 [place] 01/14/2021 2116   PROTEINUR 30 (A) 01/14/2021 2116   NITRITE NEGATIVE 01/14/2021 2116   LEUKOCYTESUR NEGATIVE 01/14/2021 2116   Sepsis Labs: Invalid input(s): PROCALCITONIN, [place]  Microbiology: No results found for this or any previous visit (from the past 240 hour(s)).  Radiology Studies: No results found.    Lashawna Poche T. Wentworth  If 7PM-7AM, please contact  night-coverage [email or web address] 03/01/2021, 10:44 AM

## 2021-03-02 LAB — GLUCOSE, CAPILLARY
Glucose-Capillary: 106 mg/dL — ABNORMAL HIGH (ref 70–99)
Glucose-Capillary: 122 mg/dL — ABNORMAL HIGH (ref 70–99)
Glucose-Capillary: 129 mg/dL — ABNORMAL HIGH (ref 70–99)
Glucose-Capillary: 144 mg/dL — ABNORMAL HIGH (ref 70–99)
Glucose-Capillary: 161 mg/dL — ABNORMAL HIGH (ref 70–99)
Glucose-Capillary: 172 mg/dL — ABNORMAL HIGH (ref 70–99)

## 2021-03-02 LAB — HEPATIC FUNCTION PANEL
ALT: 157 U/L — ABNORMAL HIGH (ref 0–44)
AST: 66 U/L — ABNORMAL HIGH (ref 15–41)
Albumin: 2.1 g/dL — ABNORMAL LOW (ref 3.5–5.0)
Alkaline Phosphatase: 108 U/L (ref 38–126)
Bilirubin, Direct: <0.1 mg/dL (ref 0.0–0.2)
Total Bilirubin: 0.4 mg/dL (ref 0.3–1.2)
Total Protein: 6.9 g/dL (ref 6.5–8.1)

## 2021-03-02 NOTE — TOC Progression Note (Signed)
Transition of Care [facility]) - Progression Note    Patient Details  Name: Stephen Delgado MRN: [id number] Date of Birth: 11-Apr-1965  Transition of Care [facility] CM/SW Contact  [name], RN Phone Number: 03/02/2021, 11:23 AM  Clinical Narrative:    CM called and spoke with Esaw Grandchild, CM at [facility] nursing home in [place] and she requests updated respiratory flowsheets, MAR and progress notes be sent to her secure email for review.  Updated clinicals were emailed to [email or web address].  No bed offers have been made at this time.  CM and MSW with DTP Team will continue to follow the patient for SNF placement.  Expected Discharge Plan: Skilled Nursing Facility Barriers to Discharge: Continued Medical Work up  Expected Discharge Plan and Services Expected Discharge Plan: Skilled Nursing Facility   Discharge Planning Services: CM Consult Post Acute Care Choice: Skilled Nursing Facility Living arrangements for the past 2 months: Single Family Home                                       Social Determinants of Health (SDOH) Interventions    Readmission Risk Interventions Readmission Risk Prevention Plan 02/20/2021  Transportation Screening Complete  Medication Review (RN Care Manager) Complete  PCP or Specialist appointment within 3-5 days of discharge Not Complete  PCP/Specialist Appt Not Complete comments Patient will need LTC placement at [facility] facility.  HRI or Home Care Consult Complete  SW Recovery Care/Counseling Consult Complete  Palliative Care Screening Complete  Skilled Nursing Facility Complete  Some recent data might be hidden

## 2021-03-02 NOTE — Progress Notes (Signed)
CCM Progress Note  Patient Details Name: Stephen Delgado MRN: [id number] DOB: [birth year]  LOS: 18  56 year old man who remains in a minimally responsive state following ICH. Following for trach management  On exam: minimally responsive with no respiratory distress. Tracheostomy site is intact. 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  03/02/2021, 3:26 PM

## 2021-03-02 NOTE — Progress Notes (Signed)
[facility] °PROGRESS NOTE ° °Delon N Burridge MRN:[id number] DOB: 12/09/1965 DOA: 12/30/2020 °PCP: Patient, [name] ° ° °01/29/2021 ° °  °01/29/2021                 02/04/2020 ° °Status: °Remains inpatient appropriate because:  °Unsafe discharge plan.  Patient in persistent vegetative state per stroke needs placement at skilled nursing facility that is trach capable-medically stable for discharge noting trach has been in place now for 30 days ° °Barriers to discharge: °Social: °Current insurance coverage about to expire and he otherwise has no funding for long-term care.  Financial services clarifying regarding eligibility for disability and Medicaid ° °Clinical: °Tracheostomy and PEG dependent with recurrent tracheobronchitis; remains a full code ° °Level of care:  °Med-Surg  ° ° °Code Status: Full code °Family Communication:1/30 wife [name]  °DVT prophylaxis: SCDs °COVID vaccination status: Unknown ° °Foley catheter: 16 French inserted on 12/17; changed on 12/24 °POA: No ° °HPI: °55 y.o. male with PMH significant for DM2, HTN, CKD with questionable compliance to medications °Patient presented to the ED on 12/30/2020 with complaint of headache, altered mental status °  °In the ED, he was hypertensive to 238/160, agitated, required restraints °CT head showed large amount of intraventricular hemorrhage involving lateral/third/fourth ventricle, ICH and left basal ganglia as well as chronic microvascular ischemic changes. °He was started on Cleviprex drip and admitted to neuro ICU °  °He subsequently had CT head repeated on 11/30, 12/1 and 12/3, all demonstrated unchanged intraparenchymal hemorrhage and intraventricular hemorrhage °12/3, MRI brain from also showed numerous small acute infarcts in both cerebral hemisphere with minimal improvement in the right superior cerebellum °12/3, intubated °12/13, tracheostomy °12/15, wean from vent to trach collar °12/16, PEG tube placement °12/16, repeat MRI  showed an increase in the size and number of acute infarcts in the bilateral hemispheres with increased involvement in the cerebellum bilaterally.   °12/18, transferred from neuro service to [facility]  °12/21, palliative care consulted.  Family wanted to continue aggressive care as full CODE STATUS. °12/23 developed Cheyne-Stokes respiratory pattern with respiratory alkalosis.  Transferred to ICU for short-term mechanical ventilation and was found to have new tracheobronchitis now on cefepime °12/25 back to progressive floor and to [facility] °  °His hospital course has been complicated by significant neurological impairment, aspiration events, AKI, hypernatremia, urinary retention. °1/9 trach changed to #6.0 Shiley cuffless--1/10 > 30 days since trach inserted. ° °Subjective: °Eyes are open but patient is not tracking. ° °Objective: °Vitals:  ° 03/02/21 0350 03/02/21 0730  °BP: (!) 143/77 (!) 141/77  °Pulse: 89 89  °Resp: 16 20  °Temp: 99.5 °F (37.5 °C) 98.5 °F (36.9 °C)  °SpO2: 96% 93%  ° ° °Intake/Output Summary (Last 24 hours) at 03/02/2021 0733 °Last data filed at 03/01/2021 2300 °Gross per 24 hour  °Intake 1030 ml  °Output 850 ml  °Net 180 ml  ° °Filed Weights  ° 02/20/21 0347 02/26/21 0500 02/28/21 0555  °Weight: 79.8 kg 78.5 kg 78 kg  ° ° °Exam: ° °Constitutional: Unresponsive, NAD °Respiratory: #6.0 Shiley cuffless trach,  Lung sounds more coarse bilaterally,, weak wet sounding cough noted, trach with thick tan secretions .  Cheyne-Stokes respiratory pattern today.  O2 sats between 97 and 99% that dipped to 94% during periods of apnea that lasted as long as 14 seconds °Cardiovascular: S1-S2, NSR, normotensive -no bilateral lower extremity edema  °Abdomen:  Abdomen soft and nontender.  PEG tube -normoactive bowels -rectal Foley in place draining   dark thick brown stool °Neurologic: Upper extremities are flaccid to PROM.-No further hypertonicity of lower extremities elicited with PROM.  °Psychiatric:  Unresponsive ° ° °Assessment/Plan: °Acute problems: °Brain injury 2/2 acute intraparenchymal and intraventricular hemorrhage/Bilateral multifocal ischemic infarcts  °-Follow-up MRI on 12/16 demonstrated increase in size and number of acute infarcts  °-No a/c intracranial hemorrhage. °-Has intermittent episodes of [place] and Cheyne-Stokes respiratory patterns alternating with normal respiratory pattern °-Continue baclofen, Symmetrel and scheduled Oxy IR  °-Zyprexa at bedtime only.  Low-dose given recent elevation in LFTs-currently stable with AST's in the 60s and ALTs in the 150s °-No real progress per OT- plan is to have family at bedside for education then sign off ° °EOL discussion °-per PMT 1/8 family requests that current level of care be continued including full CODE STATUS ° °Acute respiratory failure with hypoxia 2/2 recurrent Serratia tracheobronchitis (recent MSSA and serratia pneumonia; recent Klebsiella tracheobronchitis); °-Has had 2 episodes since admission and has completed antibiotics °-1/16 tracheal aspirate recultured-few Serratia but no clinical signs of infection so ID rec no anbxs °-CT sinuses: rhinosinusitis worse on the left- wife ported patient has history of chronic sinus issues ° °Loose stool/rectal tube °-Continue Neutrisource fiber pack AND Imodium ° °Known malignant hypertension/EKG with LVH strain °-Continue max dose Coreg, hydralazine, Norvasc and clonidine °  °AKI on CKD 3b/acute azotemia with mild hypernatremia °-Baseline renal function: January 2022 BUN was 18 and creatinine 1.49 °-Current creatinine 1.18 with a BUN of 37 °-Sodium has been trending upward as has BUN and calcium.  On 1/25 increased free water to 200 cc every 4 hours °-Check electrolytes in a.m. on 1/31 °  °Non obstructive transaminitis °-Recurrent issue throughout hospitalization °-CT abdomen and pelvis 12/23 unremarkable no evidence of cirrhosis or other biliary tree issues °-LFTs had improved with mild elevation  in ALT of 49 as of 1/9  °-New worsening transaminitis with peak AST 74 and ALT 243.  By 1/20 8T down to 56 and ALT 155 after Zyprexa dose lower °-Follow hepatic function panel daily and monitor for return to baseline °-Hepatitis panel unremarkable ° °Type 2 diabetes mellitus °-A1c 6.3 in 12/31/2020 °-Continue Levemir to 24 units BID; continue SSI every 4 hours °-CBGs well controlled °  °Protein calorie malnutrition/dysphagia °Nutrition Problem: Inadequate oral intake °Etiology: lethargy/confusion, dysphagia °Signs/Symptoms: NPO status °Interventions: Tube feeding via PEG °Body mass index is 23.33 kg/m².  °  °Acute urinary retention  °-Continue Urecholine °-Foley catheter discontinued on 1/11 and patient voiding easily °  ° ° ° °Data Reviewed: °Basic Metabolic Panel: °Recent Labs  °Lab 02/25/21 °0344 02/28/21 °0141  °NA 152* 151*  °K 4.4 3.9  °CL 113* 114*  °CO2 26 26  °GLUCOSE 179* 161*  °BUN 83* 70*  °CREATININE 1.14 1.05  °CALCIUM 10.4* 10.2  ° ° °CBC: °Recent Labs  °Lab 02/28/21 °0141  °WBC 16.3*  °NEUTROABS 13.2*  °HGB 10.5*  °HCT 35.0*  °MCV 79.4*  °PLT 223  ° ° ° °CBG: °Recent Labs  °Lab 03/01/21 °1206 03/01/21 °1614 03/01/21 °2023 03/01/21 °2323 03/02/21 °0404  °GLUCAP 148* 151* 122* 116* 129*  ° ° ° ° °Scheduled Meds: ° amantadine  100 mg Per Tube BID  ° amLODipine  2.5 mg Per Tube QHS  ° baclofen  10 mg Per Tube TID AC & HS  ° bethanechol  10 mg Per Tube TID  ° carvedilol  25 mg Per Tube BID WC  ° chlorhexidine gluconate (MEDLINE KIT)  15 mL Mouth Rinse BID  ° Chlorhexidine Gluconate   Cloth  6 each Topical Q0600   cholestyramine  4 g Per Tube TID   cloNIDine  0.2 mg Per Tube BID   fiber  1 packet Per Tube BID   free water  250 mL Per Tube Q4H   heparin injection (subcutaneous)  5,000 Units Subcutaneous Q8H   hydrALAZINE  100 mg Per Tube Q8H   insulin aspart  0-20 Units Subcutaneous Q4H   insulin detemir  24 Units Subcutaneous BID   loperamide HCl  1 mg Per Tube Daily   mouth rinse  15 mL Mouth  Rinse 10 times per day   nutrition supplement (JUVEN)  1 packet Per Tube BID BM   OLANZapine  2.5 mg Per Tube QHS   oxyCODONE  2.5 mg Per Tube Q8H   pantoprazole sodium  40 mg Per Tube Daily   scopolamine  1 patch Transdermal Q72H   Continuous Infusions:  feeding supplement (GLUCERNA 1.5 CAL) 60 mL/hr at 03/02/21 0158    Principal Problem:   ICH (intracerebral hemorrhage) ([place]) Active Problems:   Malignant hypertension   Diabetes mellitus type 2 in obese ([place])   Acute respiratory failure ([place])   Tracheostomy dependence ([place])   Pneumonia due to Serratia marcescens ([place])   MSSA (methicillin susceptible Staphylococcus aureus) pneumonia ([place])   Stage 3b chronic kidney disease (CKD) ([place])   Acute kidney injury ([place])   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 ([place])   Acute hypernatremia   Dysphagia due to recent stroke   Acute urinary retention   Respiration abnormal   Pressure injury of skin   Abdominal distention   Intractable hiccups   Pneumonia due to Klebsiella pneumoniae ([facility])   Swollen lip   Fever   Muscular hypertonicity   Tracheobronchitis, chronic ([place])   Acute bacterial sinusitis   Transaminitis   Consultants: Neurology PCCM Medicine  Procedures: Echocardiogram EEG Cortrack PEG tube placement Tracheostomy tube placement  Antibiotics: Zosyn 12/15 through 12/21 Cefepime 12/24 through 12/27 Bactrim 12/27 >> 12/29 Cefepime 12/31 through 1/5   Time spent: 15 minutes    Erin Hearing ANP  [facility] 7 am - 330 pm/M-F for direct patient care and secure chat Please refer to Amion for contact info 62  days

## 2021-03-02 NOTE — Progress Notes (Addendum)
2:50pm: CSW spoke with Tammy who states the patient does not meet specialized care criteria for admission into the facility.  10:15am: CSW spoke with Rip Harbour at [facility] who states the facility does not accept trach's.  CSW sent updated clinicals to [place] at [facility] in [place], [place].  Madilyn Fireman, MSW, LCSW Transitions of Care   Clinical Social Worker II [phone]

## 2021-03-02 NOTE — Progress Notes (Signed)
Nutrition Follow-up  DOCUMENTATION CODES:   Not applicable  INTERVENTION:  Continue TF via PEG: Glucerna 1.5 @ 39m/hr (14459mday) Free water per MD, currently 2508mree water Q4H   Tube feeding regimen provides 2160 kcal, 118 grams of protein, and 1092 ml of H2O (2592m77mtal free water).  -1 packet Juven BID, each packet provides 95 calories, 2.5 grams of protein (collagen), and 9.8 grams of carbohydrate (3 grams sugar); also contains 7 grams of L-arginine and L-glutamine, 300 mg vitamin C, 15 mg vitamin E, 1.2 mcg vitamin B-12, 9.5 mg zinc, 200 mg calcium, and 1.5 g  Calcium Beta-hydroxy-Beta-methylbutyrate to support wound healing  NUTRITION DIAGNOSIS:   Inadequate oral intake related to lethargy/confusion, dysphagia as evidenced by NPO status.  ongoing  GOAL:   Patient will meet greater than or equal to 90% of their needs  Met with TF  MONITOR:   Diet advancement, Labs, Weight trends  REASON FOR ASSESSMENT:   Ventilator, Consult Enteral/tube feeding initiation and management  ASSESSMENT:   55 y65r old male who presented to the ED on 11/29 with AMS. PMH of HTN, T2DM, CKD stage III. 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  11/30 cortrak placed; tip gastric 12/2 trickle TF 12/3 pt intubated due to increased WOB, +emesis, TF held 12/4 trickle TF restarted 12/5 advancement orders placed 12/9 TF adjusted 12/13 s/p tracheostomy 12/16 s/p EGD and PEG placement 12/19 tx out of ICU to [facility]/23 developed Cheyne-Stokes respiratory pattern with respiratory alkalosis.Transferred to ICU for short-term mechanical ventilation and was found to have new tracheobronchitis now on cefepime 12/25 tx back to [facility]9 trach changed to #6 shiley cuffless  1/10 marks 30 days since trach placed  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 Continues to require TF via PEG and tolerating w/o issue. Per MD, pt  continues to be medically stable for discharge pending bed offer.   Current TF: Glucerna 1.5 @ 60ml9mw/ 250ml 65m water Q4H  UOP: 850ml x47mours I/O: +8423ml si11madmit  Medications: SSI Q4H, 24 units levemir BID, imodium, nutrisource fiber BID, juven BID, protonix  Labs: Recent Labs  Lab 02/25/21 0344 02/28/21 0141  NA 152* 151*  K 4.4 3.9  CL 113* 114*  CO2 26 26  BUN 83* 70*  CREATININE 1.14 1.05  CALCIUM 10.4* 10.2  GLUCOSE 179* 161*  CBGs: 161-106-129-116 Elevated LFTs  Diet Order:   Diet Order             Diet NPO time specified  Diet effective midnight                   EDUCATION NEEDS:   Not appropriate for education at this time  Skin:  Skin Assessment: Skin Integrity Issues: Skin Integrity Issues:: Stage II Stage II: anus  Last BM:  1/28  Height:   Ht Readings from Last 1 Encounters:  01/23/21 6' (1.829 m)    Weight:   Wt Readings from Last 1 Encounters:  02/28/21 78 kg   BMI:  Body mass index is 23.33 kg/m.  Estimated Nutritional Needs:   Kcal:  2150-2350  Protein:  105-120 grams  Fluid:  >2L     Samaia Iwata ATheone StanleyD, LDN (she/her/hers) RD pager number and weekend/on-call pager number located in [email or web address]

## 2021-03-03 LAB — BASIC METABOLIC PANEL
Anion gap: 9 (ref 5–15)
BUN: 64 mg/dL — ABNORMAL HIGH (ref 6–20)
CO2: 24 mmol/L (ref 22–32)
Calcium: 9.7 mg/dL (ref 8.9–10.3)
Chloride: 110 mmol/L (ref 98–111)
Creatinine, Ser: 1.1 mg/dL (ref 0.61–1.24)
GFR, Estimated: >60 mL/min (ref >60)
Glucose, Bld: 174 mg/dL — ABNORMAL HIGH (ref 70–99)
Potassium: 4.2 mmol/L (ref 3.5–5.1)
Sodium: 143 mmol/L (ref 135–145)

## 2021-03-03 LAB — GLUCOSE, CAPILLARY
Glucose-Capillary: 152 mg/dL — ABNORMAL HIGH (ref 70–99)
Glucose-Capillary: 150 mg/dL — ABNORMAL HIGH (ref 70–99)
Glucose-Capillary: 150 mg/dL — ABNORMAL HIGH (ref 70–99)
Glucose-Capillary: 149 mg/dL — ABNORMAL HIGH (ref 70–99)
Glucose-Capillary: 125 mg/dL — ABNORMAL HIGH (ref 70–99)
Glucose-Capillary: 147 mg/dL — ABNORMAL HIGH (ref 70–99)

## 2021-03-03 NOTE — Progress Notes (Signed)
CSW spoke with Carole at [facility] in [place] who states the facility does not accept trach's. ° °CSW attempted to reach admissions at [facility] in [place] without success - no voicemail option available. ° °CSW spoke with Taneshia at [facility] in [place] who states the facility does accept trach's but there are no beds available right now. Taneshia agreeable to review referral - clinicals sent via secure e-mail. ° °CSW spoke with Cindy at [facility] who states the facility does accept trach's. Cindy agreeable to review referral - clinicals sent via secure e-mail. ° °Glori Machnik, MSW, LCSW °Transitions of Care   Clinical Social Worker II °[phone] ° °

## 2021-03-03 NOTE — Progress Notes (Signed)
Occupational Therapy Treatment Patient Details Name: Stephen Delgado MRN: [id number] DOB: 1965/11/28 Today's Date: 03/03/2021   History of present illness 56 y/o presented to [facility] ED on 11/29 for AMS, lethargy, and vomiting. Transferred to [facility]. CTH showed L caudate/BG hemorrhage with IVH. Declining respiratory status with snoring respirations. Intubated 12/3. Trach placed 12/13.on 12/16, repeat MRI showed an increase in the size and number of acute infarcts in the bilateral hemispheres with increased involvement in the cerebellum bilaterally. On 12/23, pt transferred to ICU for worsening respiratory status due to tracheobronchitis, requiring short term mechanical ventilation. PMH: HTN, sleep apnea, DM, CKD 3   OT comments  Pt continues to be total A for all activity at bed level. No response to stimuli, eyes open about 50% of session and noted to stay at midline or fall with the direction of dependent head turn. This therapist attempted to contact pt's family to schedule family education session, no answer. Will continue efforts to schedule session with family prior to OT signing off acutely. D/c remains appropriate.    Recommendations for follow up therapy are one component of a multi-disciplinary discharge planning process, led by the attending physician.  Recommendations may be updated based on patient status, additional functional criteria and insurance authorization.    Follow Up Recommendations  Skilled nursing-short term rehab (<3 hours/day)    Assistance Recommended at Discharge Frequent or constant Supervision/Assistance           Precautions / Restrictions Precautions Precautions: Fall Precaution Comments: G-tube, trach collar, flexiseal, foley, BUE/pedal edema Restrictions Weight Bearing Restrictions: No       Mobility Bed Mobility Overal bed mobility: Needs Assistance Bed Mobility: Rolling Rolling: Total assist         General bed mobility comments: total A for rolling,  sitting not attempted this session    Transfers                             ADL either performed or assessed with clinical judgement   ADL Overall ADL's : Needs assistance/impaired                                       General ADL Comments: continues to be total A for care at bed level. no activite or purposeful participation.    Extremity/Trunk Assessment Upper Extremity Assessment RUE Deficits / Details: axillary tone/contacture, tone noted proximal, flaccid distal LUE Deficits / Details: axillary tone/contacture, tone noted proximal, flaccid distal            Vision   Vision Assessment?: Vision impaired- to be further tested in functional context Additional Comments: eyes open 50% of the session. Closed eyes 1x to command however likely to be coincidental; eyes stay at midline/toward head direction with head rotation   Perception Perception Perception: Not tested   Praxis Praxis Praxis: Not tested    Cognition Arousal/Alertness: Lethargic Behavior During Therapy: Flat affect Overall Cognitive Status: Difficult to assess           General Comments: patient did not respond to any stimuli during session - eyes were open 50% of session. pt grimacing to cough only, other wise no change in flat affect.        Exercises Exercises: Other exercises, General Upper Extremity General Exercises - Upper Extremity Shoulder Flexion: PROM, Both, 10 reps, Supine Shoulder Extension: PROM, 10 reps,  Both, Supine Shoulder ABduction: PROM, Both, 10 reps, Supine Shoulder ADduction: PROM, Both, 10 reps, Supine Elbow Flexion: PROM, Both, 10 reps, Supine Elbow Extension: PROM, Both, 10 reps, Supine Wrist Flexion: PROM, Both, 10 reps, Supine Wrist Extension: PROM, Both, 10 reps, Supine Digit Composite Flexion: PROM, Both, 10 reps, Supine Composite Extension: PROM, Both, Supine, 5 reps Other Exercises Other Exercises: General PROM of BUE; scapular  retraction & protraction in sidelying, cervical ROM       General Comments Per monitor RR from 0-55 throughout session. likely not accurate during movement. Attempted to contact all family members in the chart several times with no answer. Will continue efforts to schedule family educaiton session    Pertinent Vitals/ Pain       Pain Assessment Pain Assessment: Faces Pain Intervention(s): Monitored during session   Frequency  Min 1X/week        Progress Toward Goals  OT Goals(current goals can now be found in the care plan section)  Progress towards OT goals: Not progressing toward goals - comment  Acute Rehab OT Goals OT Goal Formulation: Patient unable to participate in goal setting Time For Goal Achievement: 03/04/21 Potential to Achieve Goals: Poor ADL Goals Additional ADL Goal #1: Pt will follow 1 step commands 25% of the time as a precursor for ADLs Additional ADL Goal #2: pt will tolerate sitting EOB for at least 10 minutes with max A +2 with no adverse signs or symptoms Additional ADL Goal #3: pt's family will demonstrate appropriate pt positioning for skin integrity and edema control given verbal cues  Plan Discharge plan remains appropriate       AM-PAC OT "6 Clicks" Daily Activity     Outcome Measure   Help from another person eating meals?: Total Help from another person taking care of personal grooming?: Total Help from another person toileting, which includes using toliet, bedpan, or urinal?: Total Help from another person bathing (including washing, rinsing, drying)?: Total Help from another person to put on and taking off regular upper body clothing?: Total Help from another person to put on and taking off regular lower body clothing?: Total 6 Click Score: 6    End of Session Equipment Utilized During Treatment: Oxygen  OT Visit Diagnosis: Other abnormalities of gait and mobility (R26.89);Muscle weakness (generalized) (M62.81);Low vision, both eyes  (H54.2);Other symptoms and signs involving the nervous system (R29.898);Other symptoms and signs involving cognitive function;Cognitive communication deficit (R41.841) Symptoms and signs involving cognitive functions: Nontraumatic SAH;Other Nontraumatic ICH   Activity Tolerance Patient limited by lethargy   Patient Left in bed;with call bell/phone within reach;with bed alarm set   Nurse Communication Mobility status (Attempting to contact family for family education session - if family visits, ask them for best contact and day to schedule.)        Time: 1531-1553 OT Time Calculation (min): 22 min  Charges: OT General Charges $OT Visit: 1 Visit OT Treatments $Therapeutic Exercise: 8-22 mins   Meghanne Pletz A Kavina Cantave 03/03/2021, 4:14 PM

## 2021-03-03 NOTE — Plan of Care (Signed)
  Problem: Clinical Measurements: Goal: Will remain free from infection Outcome: Progressing Goal: Respiratory complications will improve Outcome: Progressing   Problem: Nutrition: Goal: Adequate nutrition will be maintained Outcome: Progressing   

## 2021-03-03 NOTE — Progress Notes (Signed)
[facility] PROGRESS NOTE  Stephen Delgado PTW:[id number] DOB: 17-Oct-1965 DOA: 12/30/2020 PCP: Patient, [name]   01/29/2021    01/29/2021                 02/04/2020  Status: Remains inpatient appropriate because:  Unsafe discharge plan.  Patient in persistent vegetative state per stroke needs placement at skilled nursing facility that is trach capable-medically stable for discharge noting trach has been in place now for 30 days  Barriers to discharge: Social: Current insurance coverage about to expire and he otherwise has no funding for long-term care.  Financial services clarifying regarding eligibility for disability and Medicaid  Clinical: Tracheostomy and PEG dependent with recurrent tracheobronchitis; remains a full code  Level of care:  Med-Surg    Code Status: Full code Family Communication:1/30 wife [name]  DVT prophylaxis: SCDs COVID vaccination status: Unknown  Foley catheter: 45 French inserted on 12/17; changed on 12/24 POA: No  HPI: 56 y.o. male with PMH significant for DM2, HTN, CKD with questionable compliance to medications Patient presented to the ED on 12/30/2020 with complaint of headache, altered mental status   In the ED, he was hypertensive to 238/160, agitated, required restraints CT head showed large amount of intraventricular hemorrhage involving lateral/third/fourth ventricle, ICH and left basal ganglia as well as chronic microvascular ischemic changes. He was started on Cleviprex drip and admitted to neuro ICU   He subsequently had CT head repeated on 11/30, 12/1 and 12/3, all demonstrated unchanged intraparenchymal hemorrhage and intraventricular hemorrhage 12/3, MRI brain from also showed numerous small acute infarcts in both cerebral hemisphere with minimal improvement in the right superior cerebellum 12/3, intubated 12/13, tracheostomy 12/15, wean from vent to trach collar 12/16, PEG tube placement 12/16, repeat MRI  showed an increase in the size and number of acute infarcts in the bilateral hemispheres with increased involvement in the cerebellum bilaterally.   12/18, transferred from neuro service to [facility]  12/21, palliative care consulted.  Family wanted to continue aggressive care as full CODE STATUS. 12/23 developed Cheyne-Stokes respiratory pattern with respiratory alkalosis.  Transferred to ICU for short-term mechanical ventilation and was found to have new tracheobronchitis now on cefepime 12/25 back to progressive floor and to [facility]   His hospital course has been complicated by significant neurological impairment, aspiration events, AKI, hypernatremia, urinary retention. 1/9 trach changed to #6.0 Shiley cuffless--1/10 > 30 days since trach inserted.  Subjective: Opened eyes to voice and tactile stimulation.  Did not make eye contact and no tracking  Objective: Vitals:   03/03/21 0342 03/03/21 0444  BP: (!) 144/90   Pulse: 90   Resp: (!) 24 20  Temp: 98.4 F (36.9 C)   SpO2: 94%     Intake/Output Summary (Last 24 hours) at 03/03/2021 0724 Last data filed at 03/03/2021 0548 Gross per 24 hour  Intake 2082 ml  Output 1050 ml  Net 1032 ml   Filed Weights   02/20/21 0347 02/26/21 0500 02/28/21 0555  Weight: 79.8 kg 78.5 kg 78 kg    Exam:  Constitutional: Calm and opens eyes to tactile stimulation and voice NAD Respiratory: #6.0 Shiley cuffless trach,  Lung sounds more coarse bilaterally,, weak wet sounding cough noted, trach with thick tan secretions.  O2 sats between 97 and 99%  Cardiovascular: S1-S2, NSR, normotensive -no bilateral lower extremity edema  Abdomen:  Abdomen soft and nontender.  PEG tube -normoactive bowels -rectal Foley in place draining dark thick brown stool Neurologic: Upper extremities  are flaccid to PROM.-No further hypertonicity of lower extremities elicited with PROM.  Psychiatric: Opens eyes to tactile stimulation.  Does not track or make any efforts to  communicate.  Nonverbal secondary to trach tube   Assessment/Plan: Acute problems: Brain injury 2/2 acute intraparenchymal and intraventricular hemorrhage/Bilateral multifocal ischemic infarcts  -Follow-up MRI on 12/16 demonstrated increase in size and number of acute infarcts  -No a/c 2/2 intracranial hemorrhage. -Has intermittent episodes of Kusmalls and Cheyne-Stokes respiratory patterns alternating with normal respiratory pattern -Continue baclofen, Symmetrel and scheduled Oxy IR  -Zyprexa at bedtime only.  Low-dose given recent elevation in LFTs-currently stable with AST's in the 60s and ALTs in the 150s -No real progress per OT  EOL discussion -per PMT 1/8 family requests that current level of care be continued including full CODE STATUS  Acute respiratory failure with hypoxia 2/2 recurrent Serratia tracheobronchitis (recent MSSA and serratia pneumonia; recent Klebsiella tracheobronchitis); -Has had 2 episodes since admission and has completed antibiotics -CT sinuses: rhinosinusitis worse on the left- wife ported patient has history of chronic sinus issues  Loose stool/rectal tube -Continue Neutrisource fiber pack AND Imodium  Known malignant hypertension/EKG with LVH strain -Continue max dose Coreg, hydralazine, Norvasc and clonidine   AKI on CKD 3b/acute azotemia with mild hypernatremia -Baseline renal function: BUN 18 and cr 1.49 -Recent cr 1.18, BUN of 64 -Na 143-continue free water 250 cc q 4 hrs   Non obstructive transaminitis -CT abdomen and pelvis 12/23 unremarkable no evidence of cirrhosis or other biliary tree issues -Peak AST 74 and ALT 243.  By 1/20 AST 56 and ALT 155 after Zyprexa dose lowered -Hepatitis panel unremarkable  Type 2 diabetes mellitus -A1c 6.3 in 12/31/2020 -Continue Levemir to 24 units BID; continue SSI every 4 hours -CBGs well controlled   Protein calorie malnutrition/dysphagia Nutrition Problem: Inadequate oral intake Etiology:  lethargy/confusion, dysphagia Signs/Symptoms: NPO status Interventions: Tube feeding via PEG Body mass index is 23.33 kg/m.    Acute urinary retention  -Continue Urecholine -Foley catheter discontinued on 1/11 and patient voiding easily      Data Reviewed: Basic Metabolic Panel: Recent Labs  Lab 02/25/21 0344 02/28/21 0141 03/03/21 0036  NA 152* 151* 143  K 4.4 3.9 4.2  CL 113* 114* 110  CO2 _0 GLUCOSE 179* 161* 174*  BUN 83* 70* 64*  CREATININE 1.14 1.05 1.10  CALCIUM 10.4* 10.2 9.7    CBC: Recent Labs  Lab 02/28/21 0141  WBC 16.3*  NEUTROABS 13.2*  HGB 10.5*  HCT 35.0*  MCV 79.4*  PLT 223     CBG: Recent Labs  Lab 03/02/21 1115 03/02/21 1533 03/02/21 1950 03/02/21 2345 03/03/21 0412  GLUCAP 161* 144* 122* 172* 152*      Scheduled Meds:  amantadine  100 mg Per Tube BID   amLODipine  2.5 mg Per Tube QHS   baclofen  10 mg Per Tube TID AC & HS   bethanechol  10 mg Per Tube TID   carvedilol  25 mg Per Tube BID WC   chlorhexidine gluconate (MEDLINE KIT)  15 mL Mouth Rinse BID   Chlorhexidine Gluconate Cloth  6 each Topical Q0600   cholestyramine  4 g Per Tube TID   cloNIDine  0.2 mg Per Tube BID   fiber  1 packet Per Tube BID   free water  250 mL Per Tube Q4H   heparin injection (subcutaneous)  5,000 Units Subcutaneous Q8H   hydrALAZINE  100 mg Per Tube Q8H  insulin aspart  0-20 Units Subcutaneous Q4H   insulin detemir  24 Units Subcutaneous BID   loperamide HCl  1 mg Per Tube Daily   mouth rinse  15 mL Mouth Rinse 10 times per day   nutrition supplement (JUVEN)  1 packet Per Tube BID BM   OLANZapine  2.5 mg Per Tube QHS   oxyCODONE  2.5 mg Per Tube Q8H   pantoprazole sodium  40 mg Per Tube Daily   scopolamine  1 patch Transdermal Q72H   Continuous Infusions:  feeding supplement (GLUCERNA 1.5 CAL) 60 mL/hr at 03/03/21 0548    Principal Problem:   ICH (intracerebral hemorrhage) ([place]) Active Problems:   Malignant hypertension    Diabetes mellitus type 2 in obese ([place])   Acute respiratory failure ([place])   Tracheostomy dependence ([place])   Pneumonia due to Serratia marcescens ([place])   MSSA (methicillin susceptible Staphylococcus aureus) pneumonia ([place])   Stage 3b chronic kidney disease (CKD) ([place])   Acute kidney injury ([place])   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 ([place])   Dysphagia due to recent stroke   Acute urinary retention   Pressure injury of skin   Intractable hiccups   Pneumonia due to Klebsiella pneumoniae (HCC)   Fever   Muscular hypertonicity   Tracheobronchitis, chronic ([place])   Acute bacterial sinusitis   Transaminitis   Consultants: Neurology PCCM Medicine  Procedures: Echocardiogram EEG Cortrack PEG tube placement Tracheostomy tube placement  Antibiotics: Zosyn 12/15 through 12/21 Cefepime 12/24 through 12/27 Bactrim 12/27 >> 12/29 Cefepime 12/31 through 1/5   Time spent: 15 minutes    Erin Hearing ANP  [facility] 7 am - 330 pm/M-F for direct patient care and secure chat Please refer to Amion for contact info 63  days

## 2021-03-04 DIAGNOSIS — I639 Cerebral infarction, unspecified: Secondary | ICD-10-CM

## 2021-03-04 DIAGNOSIS — I6782 Cerebral ischemia: Secondary | ICD-10-CM

## 2021-03-04 DIAGNOSIS — E441 Mild protein-calorie malnutrition: Secondary | ICD-10-CM

## 2021-03-04 HISTORY — DX: Mild protein-calorie malnutrition: E44.1

## 2021-03-04 HISTORY — DX: Cerebral infarction, unspecified: I63.9

## 2021-03-04 LAB — GLUCOSE, CAPILLARY
Glucose-Capillary: 103 mg/dL — ABNORMAL HIGH (ref 70–99)
Glucose-Capillary: 123 mg/dL — ABNORMAL HIGH (ref 70–99)
Glucose-Capillary: 124 mg/dL — ABNORMAL HIGH (ref 70–99)
Glucose-Capillary: 148 mg/dL — ABNORMAL HIGH (ref 70–99)
Glucose-Capillary: 158 mg/dL — ABNORMAL HIGH (ref 70–99)
Glucose-Capillary: 174 mg/dL — ABNORMAL HIGH (ref 70–99)

## 2021-03-04 MED ORDER — ORAL CARE MOUTH RINSE
15.0000 mL | Freq: Two times a day (BID) | OROMUCOSAL | Status: DC
  Administered 2021-03-04 – 2021-05-22 (×157): 15 mL via OROMUCOSAL

## 2021-03-04 MED ORDER — CHLORHEXIDINE GLUCONATE 0.12 % MT SOLN
15.0000 mL | Freq: Two times a day (BID) | OROMUCOSAL | Status: DC
  Administered 2021-03-04 – 2021-05-22 (×159): 15 mL via OROMUCOSAL
  Filled 2021-03-04 (×149): qty 15

## 2021-03-04 NOTE — Hospital Course (Signed)
56 y.o. male with PMH significant for DM2, HTN, CKD with questionable compliance to medications Patient presented to the ED on 12/30/2020 with complaint of headache, altered mental status   In the ED, he was hypertensive to 238/160, agitated, required restraints CT head showed large amount of intraventricular hemorrhage involving lateral/third/fourth ventricle, ICH and left basal ganglia as well as chronic microvascular ischemic changes. He was started on Cleviprex drip and admitted to neuro ICU   He subsequently had CT head repeated on 11/30, 12/1 and 12/3, all demonstrated unchanged intraparenchymal hemorrhage and intraventricular hemorrhage 12/3, MRI brain from also showed numerous small acute infarcts in both cerebral hemisphere with minimal improvement in the right superior cerebellum 12/3, intubated 12/13, tracheostomy 12/15, wean from vent to trach collar 12/16, PEG tube placement 12/16, repeat MRI showed an increase in the size and number of acute infarcts in the bilateral hemispheres with increased involvement in the cerebellum bilaterally.   12/18, transferred from neuro service to [facility]  12/21, palliative care consulted.  Family wanted to continue aggressive care as full CODE STATUS. 12/23 developed Cheyne-Stokes respiratory pattern with respiratory alkalosis.  Transferred to ICU for short-term mechanical ventilation and was found to have new tracheobronchitis now on cefepime 12/25 back to progressive floor and to [facility]   His hospital course has been complicated by significant neurological impairment, aspiration events, AKI, hypernatremia, urinary retention. 1/9 trach changed to #6.0 Shiley cuffless--1/10 > 30 days since trach inserted.

## 2021-03-04 NOTE — Progress Notes (Signed)
[facility] PROGRESS NOTE  Stephen Delgado EHU:[id number] DOB: April 14, 1965 DOA: 12/30/2020 PCP: Patient, [name]   01/29/2021    01/29/2021                 02/04/2020  Status: Remains inpatient appropriate because:  Unsafe discharge plan.  Patient in persistent vegetative state per stroke needs placement at skilled nursing facility that is trach capable-medically stable for discharge noting trach has been in place now for 30 days  Barriers to discharge: Social: Current insurance coverage about to expire and he otherwise has no funding for long-term care.  Financial services clarifying regarding eligibility for disability and Medicaid  Clinical: Tracheostomy and PEG dependent with recurrent tracheobronchitis; remains a full code  Level of care:  Med-Surg    Code Status: Full code Family Communication:1/30 wife [name]  DVT prophylaxis: SCDs COVID vaccination status: Unknown  Foley catheter: 56 French inserted on 12/17; changed on 12/24 POA: No  HPI: 56 y.o. male with PMH significant for DM2, HTN, CKD with questionable compliance to medications Patient presented to the ED on 12/30/2020 with complaint of headache, altered mental status   In the ED, he was hypertensive to 238/160, agitated, required restraints CT head showed large amount of intraventricular hemorrhage involving lateral/third/fourth ventricle, ICH and left basal ganglia as well as chronic microvascular ischemic changes. He was started on Cleviprex drip and admitted to neuro ICU   He subsequently had CT head repeated on 11/30, 12/1 and 12/3, all demonstrated unchanged intraparenchymal hemorrhage and intraventricular hemorrhage 12/3, MRI brain from also showed numerous small acute infarcts in both cerebral hemisphere with minimal improvement in the right superior cerebellum 12/3, intubated 12/13, tracheostomy 12/15, wean from vent to trach collar 12/16, PEG tube placement 12/16, repeat MRI  showed an increase in the size and number of acute infarcts in the bilateral hemispheres with increased involvement in the cerebellum bilaterally.   12/18, transferred from neuro service to [facility]  12/21, palliative care consulted.  Family wanted to continue aggressive care as full CODE STATUS. 12/23 developed Cheyne-Stokes respiratory pattern with respiratory alkalosis.  Transferred to ICU for short-term mechanical ventilation and was found to have new tracheobronchitis now on cefepime 12/25 back to progressive floor and to [facility]   His hospital course has been complicated by significant neurological impairment, aspiration events, AKI, hypernatremia, urinary retention. 1/9 trach changed to #6.0 Shiley cuffless--1/10 > 30 days since trach inserted.  Subjective: Unresponsive  Objective: Vitals:   03/04/21 0306 03/04/21 0439  BP: (!) 159/99 (!) 157/88  Pulse: 98 98  Resp: 18 20  Temp:  98.1 F (36.7 C)  SpO2: 93%     Intake/Output Summary (Last 24 hours) at 03/04/2021 0741 Last data filed at 03/04/2021 7858 Gross per 24 hour  Intake 2977 ml  Output 1800 ml  Net 1177 ml   Filed Weights   02/20/21 0347 02/26/21 0500 02/28/21 0555  Weight: 79.8 kg 78.5 kg 78 kg    Exam:  Constitutional: No acute distress, back in progress, unresponsive and not opening Respiratory: #6.0 Shiley cuffless trach,  Lung sounds more coarse bilaterally,, weak wet sounding cough noted, trach with thick tan secretions.  O2 sats between 97 and 99% -Cheyne-Stokes respiratory pattern today with apnea episodes lasting about 12 to 15 seconds with O2 sats decreasing to 92% Cardiovascular: S1-S2, NSR, normotensive -no bilateral lower extremity edema  Abdomen:  Abdomen soft and nontender.  PEG tube -normoactive bowels -rectal Foley in place draining dark thick brown stool  Neurologic: Upper extremities are flaccid to PROM.-No further hypertonicity of lower extremities elicited with PROM.  Psychiatric: Opens eyes to tactile  stimulation.  Does not track or make any efforts to communicate.  Nonverbal secondary to trach tube   Assessment/Plan: Acute problems: Brain injury 2/2 acute intraparenchymal and intraventricular hemorrhage/Bilateral multifocal ischemic infarcts  -Has intermittent episodes of Kusmalls and Cheyne-Stokes respiratory patterns alternating with normal respiratory pattern -Continue baclofen, Symmetrel and scheduled Oxy IR  -Zyprexa at bedtime only.  Low-dose given recent elevation in LFTs-currently stable with AST's in the 60s and ALTs in the 150s -No real progress per OT  EOL discussion -per PMT 1/8 family requests that current level of care be continued including full CODE STATUS  Acute respiratory failure with hypoxia 2/2 recurrent Serratia tracheobronchitis (recent MSSA and serratia pneumonia; recent Klebsiella tracheobronchitis); -Has had 2 episodes since admission and has completed antibiotics -CT sinuses: rhinosinusitis worse on the left- wife ported patient has history of chronic sinus issues  Loose stool/rectal tube -Continue Neutrisource fiber pack AND Imodium  Known malignant hypertension/EKG with LVH strain -Continue max dose Coreg, hydralazine, Norvasc and clonidine   AKI on CKD 3b/acute azotemia with mild hypernatremia -Baseline renal function: BUN 18 and cr 1.49 -Recent cr 1.18, BUN of 64 -Na 143-continue free water 250 cc q 4 hrs   Non obstructive transaminitis -CT abdomen and pelvis 12/23 unremarkable no evidence of cirrhosis or other biliary tree issues -Peak AST 74 and ALT 243.  By 1/20 AST 56 and ALT 155 after Zyprexa dose lowered -Hepatitis panel unremarkable  Type 2 diabetes mellitus -A1c 6.3 in 12/31/2020 -Continue Levemir to 24 units BID; continue SSI every 4 hours -CBGs well controlled   Protein calorie malnutrition/dysphagia Nutrition Problem: Inadequate oral intake Etiology: lethargy/confusion, dysphagia Signs/Symptoms: NPO status Interventions: Tube  feeding via PEG Body mass index is 23.33 kg/m.    Acute urinary retention  -Continue Urecholine -Foley catheter discontinued on 1/11 and patient voiding easily      Data Reviewed: Basic Metabolic Panel: Recent Labs  Lab 02/28/21 0141 03/03/21 0036  NA 151* 143  K 3.9 4.2  CL 114* 110  CO2 26 24  GLUCOSE 161* 174*  BUN 70* 64*  CREATININE 1.05 1.10  CALCIUM 10.2 9.7    CBC: Recent Labs  Lab 02/28/21 0141  WBC 16.3*  NEUTROABS 13.2*  HGB 10.5*  HCT 35.0*  MCV 79.4*  PLT 223     CBG: Recent Labs  Lab 03/03/21 1107 03/03/21 1645 03/03/21 1936 03/03/21 2257 03/04/21 0422  GLUCAP 150* 149* 125* 147* 158*      Scheduled Meds:  amantadine  100 mg Per Tube BID   amLODipine  2.5 mg Per Tube QHS   baclofen  10 mg Per Tube TID AC & HS   bethanechol  10 mg Per Tube TID   carvedilol  25 mg Per Tube BID WC   chlorhexidine gluconate (MEDLINE KIT)  15 mL Mouth Rinse BID   Chlorhexidine Gluconate Cloth  6 each Topical Q0600   cholestyramine  4 g Per Tube TID   cloNIDine  0.2 mg Per Tube BID   fiber  1 packet Per Tube BID   free water  250 mL Per Tube Q4H   heparin injection (subcutaneous)  5,000 Units Subcutaneous Q8H   hydrALAZINE  100 mg Per Tube Q8H   insulin aspart  0-20 Units Subcutaneous Q4H   insulin detemir  24 Units Subcutaneous BID   loperamide HCl  1 mg Per Tube  Daily   mouth rinse  15 mL Mouth Rinse 10 times per day   nutrition supplement (JUVEN)  1 packet Per Tube BID BM   OLANZapine  2.5 mg Per Tube QHS   oxyCODONE  2.5 mg Per Tube Q8H   pantoprazole sodium  40 mg Per Tube Daily   scopolamine  1 patch Transdermal Q72H   Continuous Infusions:  feeding supplement (GLUCERNA 1.5 CAL) 1,000 mL (03/04/21 0629)    Principal Problem:   ICH (intracerebral hemorrhage) (HCC) Active Problems:   Malignant hypertension   Diabetes mellitus type 2 in obese ([place])   Acute respiratory failure ([place])   Tracheostomy dependence ([place])   Pneumonia due to  Serratia marcescens (HCC)   MSSA (methicillin susceptible Staphylococcus aureus) pneumonia (HCC)   Stage 3b chronic kidney disease (CKD) ([place])   Acute kidney injury ([place])   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 ([place])   Dysphagia due to recent stroke   Acute urinary retention   Pressure injury of skin   Intractable hiccups   Pneumonia due to Klebsiella pneumoniae (HCC)   Fever   Muscular hypertonicity   Tracheobronchitis, chronic ([place])   Acute bacterial sinusitis   Transaminitis   Consultants: Neurology PCCM Medicine  Procedures: Echocardiogram EEG Cortrack PEG tube placement Tracheostomy tube placement  Antibiotics: Zosyn 12/15 through 12/21 Cefepime 12/24 through 12/27 Bactrim 12/27 >> 12/29 Cefepime 12/31 through 1/5   Time spent: 15 minutes    Erin Hearing ANP  [facility] 7 am - 330 pm/M-F for direct patient care and secure chat Please refer to Amion for contact info 64  days

## 2021-03-04 NOTE — Plan of Care (Signed)
  Problem: Clinical Measurements: Goal: Will remain free from infection Outcome: Progressing Goal: Respiratory complications will improve Outcome: Progressing   Problem: Nutrition: Goal: Adequate nutrition will be maintained Outcome: Progressing   

## 2021-03-05 DIAGNOSIS — I619 Nontraumatic intracerebral hemorrhage, unspecified: Secondary | ICD-10-CM | POA: Insufficient documentation

## 2021-03-05 LAB — GLUCOSE, CAPILLARY
Glucose-Capillary: 148 mg/dL — ABNORMAL HIGH (ref 70–99)
Glucose-Capillary: 148 mg/dL — ABNORMAL HIGH (ref 70–99)
Glucose-Capillary: 167 mg/dL — ABNORMAL HIGH (ref 70–99)
Glucose-Capillary: 153 mg/dL — ABNORMAL HIGH (ref 70–99)
Glucose-Capillary: 135 mg/dL — ABNORMAL HIGH (ref 70–99)

## 2021-03-05 NOTE — Assessment & Plan Note (Addendum)
Resolved-prior anus ulcer

## 2021-03-05 NOTE — Assessment & Plan Note (Signed)
-  Has had 2 episodes since admission and has completed antibiotics -CT sinuses: rhinosinusitis worse on the left- wife ported patient has history of chronic sinus issues

## 2021-03-05 NOTE — Plan of Care (Signed)

## 2021-03-05 NOTE — Assessment & Plan Note (Addendum)
-  2/2 Zyprexa -Resolved

## 2021-03-05 NOTE — Assessment & Plan Note (Addendum)
-  A1c 6.3 in 12/31/2020 -Continue SSI every 4 hours -CBGs well controlled

## 2021-03-05 NOTE — Progress Notes (Signed)
CSW spoke with Cindy at [facility] who states the facility will not offer the patient a bed. ° °CSW spoke with Taneshia at [facility] in [place] who states the facility is still reviewing the referral. ° °Elya Diloreto, MSW, LCSW °Transitions of Care   Clinical Social Worker II °[phone] ° °

## 2021-03-05 NOTE — Assessment & Plan Note (Addendum)
-  Baseline renal function: BUN 18 and cr 1.49 -Recent cr 1.06 -Na stable-decrease free water to 200 cc q 4 hrs -Repeat BMET in am

## 2021-03-05 NOTE — Assessment & Plan Note (Addendum)
Nutrition Problem: Inadequate oral intake Etiology: lethargy/confusion, dysphagia Signs/Symptoms: NPO status Interventions: Tube feeding via PEG Body mass index is 23.33 kg/m.  Continue Glucerna

## 2021-03-05 NOTE — Progress Notes (Signed)
Progress Note  =    Signed      Expand All Collapse All [facility] PROGRESS NOTE   Stephen Delgado JKK:[id number] DOB: 1965/02/06 DOA: 12/30/2020 PCP: Patient, [name]   01/29/2021     01/29/2021                 02/04/2020   Status: Remains inpatient appropriate because:  Unsafe discharge plan.  Patient in persistent vegetative state per stroke needs placement at skilled nursing facility that is trach capable-medically stable for discharge noting trach has been in place now for 30 days   Barriers to discharge: Social: Current insurance coverage about to expire and he otherwise has no funding for long-term care.  Financial services clarifying regarding eligibility for disability and Medicaid   Clinical: Tracheostomy and PEG dependent with recurrent tracheobronchitis; remains a full code   Level of care:  Med-Surg      Code Status: Full code Family Communication:1/30 wife [name]  DVT prophylaxis: SCDs COVID vaccination status: Unknown   Foley catheter: 68 French inserted on 12/17; changed on 12/24 POA: No   HPI: 56 y.o. male with PMH significant for DM2, HTN, CKD with questionable compliance to medications Patient presented to the ED on 12/30/2020 with complaint of headache, altered mental status   In the ED, he was hypertensive to 238/160, agitated, required restraints CT head showed large amount of intraventricular hemorrhage involving lateral/third/fourth ventricle, ICH and left basal ganglia as well as chronic microvascular ischemic changes. He was started on Cleviprex drip and admitted to neuro ICU   He subsequently had CT head repeated on 11/30, 12/1 and 12/3, all demonstrated unchanged intraparenchymal hemorrhage and intraventricular hemorrhage 12/3, MRI brain from also showed numerous small acute infarcts in both cerebral hemisphere with minimal improvement in the right superior cerebellum 12/3, intubated 12/13, tracheostomy 12/15, wean  from vent to trach collar 12/16, PEG tube placement 12/16, repeat MRI showed an increase in the size and number of acute infarcts in the bilateral hemispheres with increased involvement in the cerebellum bilaterally.   12/18, transferred from neuro service to [facility]  12/21, palliative care consulted.  Family wanted to continue aggressive care as full CODE STATUS. 12/23 developed Cheyne-Stokes respiratory pattern with respiratory alkalosis.  Transferred to ICU for short-term mechanical ventilation and was found to have new tracheobronchitis now on cefepime 12/25 back to progressive floor and to [facility]   His hospital course has been complicated by significant neurological impairment, aspiration events, AKI, hypernatremia, urinary retention. 1/9 trach changed to #6.0 Shiley cuffless--1/10 > 30 days since trach inserted.   Subjective: Unresponsive   Objective:     Vitals:    03/04/21 0306 03/04/21 0439  BP: (!) 159/99 (!) 157/88  Pulse: 98 98  Resp: 18 20  Temp:   98.1 F (36.7 C)  SpO2: 93%        Intake/Output Summary (Last 24 hours) at 03/04/2021 0741 Last data filed at 03/04/2021 7169    Gross per 24 hour  Intake 2977 ml  Output 1800 ml  Net 1177 ml         Filed Weights    02/20/21 0347 02/26/21 0500 02/28/21 0555  Weight: 79.8 kg 78.5 kg 78 kg      Exam:   Constitutional: No acute distress, back in progress, unresponsive and not opening Respiratory: #6.0 Shiley cuffless trach,  Lung sounds more coarse bilaterally,, weak wet sounding cough noted, trach with thick tan secretions.  O2 sats between 97  and 99% -Cheyne-Stokes respiratory pattern today with apnea episodes lasting about 12 to 15 seconds with O2 sats decreasing to 92% Cardiovascular: S1-S2, NSR, normotensive -no bilateral lower extremity edema  Abdomen:  Abdomen soft and nontender.  PEG tube -normoactive bowels -rectal Foley in place draining dark thick brown stool Neurologic: Upper extremities are flaccid to PROM.-No  further hypertonicity of lower extremities elicited with PROM.  Psychiatric: Opens eyes to tactile stimulation.  Does not track or make any efforts to communicate.  Nonverbal secondary to trach tube     Assessment/Plan: Acute problems: Brain injury 2/2 acute intraparenchymal and intraventricular hemorrhage/Bilateral multifocal ischemic infarcts  -Has intermittent episodes of Kusmalls and Cheyne-Stokes respiratory patterns alternating with normal respiratory pattern -Continue baclofen, Symmetrel and scheduled Oxy IR  -Zyprexa at bedtime only.  Low-dose given recent elevation in LFTs-currently stable with AST's in the 60s and ALTs in the 150s -No real progress per OT   EOL discussion -per PMT 1/8 family requests that current level of care be continued including full CODE STATUS   Acute respiratory failure with hypoxia 2/2 recurrent Serratia tracheobronchitis (recent MSSA and serratia pneumonia; recent Klebsiella tracheobronchitis); -Has had 2 episodes since admission and has completed antibiotics -CT sinuses: rhinosinusitis worse on the left- wife ported patient has history of chronic sinus issues   Loose stool/rectal tube -Continue Neutrisource fiber pack AND Imodium   Known malignant hypertension/EKG with LVH strain -Continue max dose Coreg, hydralazine, Norvasc and clonidine   AKI on CKD 3b/acute azotemia with mild hypernatremia -Baseline renal function: BUN 18 and cr 1.49 -Recent cr 1.18, BUN of 64 -Na 143-continue free water 250 cc q 4 hrs   Non obstructive transaminitis -CT abdomen and pelvis 12/23 unremarkable no evidence of cirrhosis or other biliary tree issues -Peak AST 74 and ALT 243.  By 1/20 AST 56 and ALT 155 after Zyprexa dose lowered -Hepatitis panel unremarkable   Type 2 diabetes mellitus -A1c 6.3 in 12/31/2020 -Continue Levemir to 24 units BID; continue SSI every 4 hours -CBGs well controlled   Protein calorie malnutrition/dysphagia Nutrition Problem:  Inadequate oral intake Etiology: lethargy/confusion, dysphagia Signs/Symptoms: NPO status Interventions: Tube feeding via PEG Body mass index is 23.33 kg/m.    Acute urinary retention  -Continue Urecholine -Foley catheter discontinued on 1/11 and patient voiding easily         Data Reviewed: Basic Metabolic Panel: Last Labs       Recent Labs  Lab 02/28/21 0141 03/03/21 0036  NA 151* 143  K 3.9 4.2  CL 114* 110  CO2 26 24  GLUCOSE 161* 174*  BUN 70* 64*  CREATININE 1.05 1.10  CALCIUM 10.2 9.7        CBC: Last Labs      Recent Labs  Lab 02/28/21 0141  WBC 16.3*  NEUTROABS 13.2*  HGB 10.5*  HCT 35.0*  MCV 79.4*  PLT 223          CBG: Last Labs          Recent Labs  Lab 03/03/21 1107 03/03/21 1645 03/03/21 1936 03/03/21 2257 03/04/21 0422  GLUCAP 150* 149* 125* 147* 158*            Scheduled Meds:  amantadine  100 mg Per Tube BID   amLODipine  2.5 mg Per Tube QHS   baclofen  10 mg Per Tube TID AC & HS   bethanechol  10 mg Per Tube TID   carvedilol  25 mg Per Tube BID WC   chlorhexidine  gluconate (MEDLINE KIT)  15 mL Mouth Rinse BID   Chlorhexidine Gluconate Cloth  6 each Topical Q0600   cholestyramine  4 g Per Tube TID   cloNIDine  0.2 mg Per Tube BID   fiber  1 packet Per Tube BID   free water  250 mL Per Tube Q4H   heparin injection (subcutaneous)  5,000 Units Subcutaneous Q8H   hydrALAZINE  100 mg Per Tube Q8H   insulin aspart  0-20 Units Subcutaneous Q4H   insulin detemir  24 Units Subcutaneous BID   loperamide HCl  1 mg Per Tube Daily   mouth rinse  15 mL Mouth Rinse 10 times per day   nutrition supplement (JUVEN)  1 packet Per Tube BID BM   OLANZapine  2.5 mg Per Tube QHS   oxyCODONE  2.5 mg Per Tube Q8H   pantoprazole sodium  40 mg Per Tube Daily   scopolamine  1 patch Transdermal Q72H    Continuous Infusions:  feeding supplement (GLUCERNA 1.5 CAL) 1,000 mL (03/04/21 0629)      Principal Problem:   ICH (intracerebral  hemorrhage) ([place]) Active Problems:   Malignant hypertension   Diabetes mellitus type 2 in obese ([place])   Acute respiratory failure ([place])   Tracheostomy dependence ([place])   Pneumonia due to Serratia marcescens ([place])   MSSA (methicillin susceptible Staphylococcus aureus) pneumonia ([place])   Stage 3b chronic kidney disease (CKD) ([place])   Acute kidney injury ([place])   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 ([place])   Dysphagia due to recent stroke   Acute urinary retention   Pressure injury of skin   Intractable hiccups   Pneumonia due to Klebsiella pneumoniae (HCC)   Fever   Muscular hypertonicity   Tracheobronchitis, chronic ([place])   Acute bacterial sinusitis   Transaminitis     Consultants: Neurology PCCM Medicine   Procedures: Echocardiogram EEG Cortrack PEG tube placement Tracheostomy tube placement   Antibiotics: Zosyn 12/15 through 12/21 Cefepime 12/24 through 12/27 Bactrim 12/27 >> 12/29 Cefepime 12/31 through 1/5     Time spent: 15 minutes       Erin Hearing ANP           [facility] 7 am - 330 pm/M-F for direct patient care and secure chat Please refer to [place] for contact info 64  days                          Electronically signed by Samella Parr, NP at 03/04/2021 10:40 AM ED to Hosp-Admission (Current) on 12/30/2020  ED to Hosp-Admission (Current) on 12/30/2020   Detailed Report  Note shared with patient Care Timeline  11/30   Admitted to [place] NEURO/TRAUMA/SURGICAL ICU from ED 0053  12/16   PERCUTANEOUS ENDOSCOPIC GASTROSTOMY (PEG) PLACEMENT  12/17   Transferred out of [place] NEURO/TRAUMA/SURGICAL ICU 1538  12/23   Transferred to [place] NEURO/TRAUMA/SURGICAL ICU 1301  12/25   Transferred out of [place] NEURO/TRAUMA/SURGICAL ICU 1453     Patient: Stephen Delgado [id number]:[id number] DOB: [birth year] DOA: 12/30/2020     65 DOS: the patient was seen and examined on 03/05/2021   Brief hospital  course: 56 y.o. male with PMH significant for DM2, HTN, CKD with questionable compliance to medications Patient presented to the ED on 12/30/2020 with complaint of headache, altered mental status   In the ED, he was hypertensive to 238/160, agitated, required restraints CT head showed large amount  of intraventricular hemorrhage involving lateral/third/fourth ventricle, ICH and left basal ganglia as well as chronic microvascular ischemic changes. He was started on Cleviprex drip and admitted to neuro ICU   He subsequently had CT head repeated on 11/30, 12/1 and 12/3, all demonstrated unchanged intraparenchymal hemorrhage and intraventricular hemorrhage 12/3, MRI brain from also showed numerous small acute infarcts in both cerebral hemisphere with minimal improvement in the right superior cerebellum 12/3, intubated 12/13, tracheostomy 12/15, wean from vent to trach collar 12/16, PEG tube placement 12/16, repeat MRI showed an increase in the size and number of acute infarcts in the bilateral hemispheres with increased involvement in the cerebellum bilaterally.   12/18, transferred from neuro service to [facility]  12/21, palliative care consulted.  Family wanted to continue aggressive care as full CODE STATUS. 12/23 developed Cheyne-Stokes respiratory pattern with respiratory alkalosis.  Transferred to ICU for short-term mechanical ventilation and was found to have new tracheobronchitis now on cefepime 12/25 back to progressive floor and to [facility]   His hospital course has been complicated by significant neurological impairment, aspiration events, AKI, hypernatremia, urinary retention. 1/9 trach changed to #6.0 Shiley cuffless--1/10 > 30 days since trach inserted.  Assessment and Plan: Brain injury 2/2 acute intraparenchymal and intraventricular hemorrhage/Bilateral multifocal ischemic infarcts with assoc muscle hypertonicity  -Has intermittent episodes of Kusmalls and Cheyne-Stokes respiratory patterns  alternating with normal respiratory pattern -Continue baclofen, Symmetrel and scheduled Oxy IR  -Zyprexa at bedtime only.  Low-dose given recent elevation in LFTs-currently stable with AST's in the 60s and ALTs in the 150s -No real progress per OT  Tracheobronchitis, chronic (HCC) -Has had 2 episodes since admission and has completed antibiotics -CT sinuses: rhinosinusitis worse on the left- wife ported patient has history of chronic sinus issues    Diabetes mellitus type 2 in obese (HCC) -A1c 6.3 in 12/31/2020 -Continue Levemir to 24 units BID; continue SSI every 4 hours -CBGs well controlled  Malignant hypertension- (present on admission) Continue max dose Coreg, hydralazine, Norvasc and clonidine  Acute kidney injury on CKD 3b/acute azotemia with mild hypernatremia- (present on admission) -Baseline renal function: BUN 18 and cr 1.49 -Recent cr 1.18, BUN of 64 -Na 143-continue free water 250 cc q 4 hrs  Protein-calorie malnutrition/dysphagia due to recent stroke Nutrition Problem: Inadequate oral intake Etiology: lethargy/confusion, dysphagia Signs/Symptoms: NPO status Interventions: Tube feeding via PEG Body mass index is 23.33 kg/m.     Pneumonia due to Klebsiella pneumoniae ([place]) Resolved  Transaminitis -CT abdomen and pelvis 12/23 unremarkable no evidence of cirrhosis or other biliary tree issues -Peak AST 74 and ALT 243.  By 1/20 AST 56 and ALT 155 after Zyprexa dose lowered -Hepatitis panel unremarkable  Acute urinary retention -Continue Urecholine -Foley catheter discontinued on 1/11 and patient voiding easily  Pressure injury of skin Pressure Injury 01/23/21 Anus Medial Stage 2 -  Partial thickness loss of dermis presenting as a shallow open injury with a red, pink wound bed without slough. Two red open areas approximately 0.5x1cm related to flexiseal (Active)  Date First Assessed/Time First Assessed: 01/23/21 1300   Location: Anus  Location Orientation: Medial   Staging: Stage 2 -  Partial thickness loss of dermis presenting as a shallow open injury with a red, pink wound bed without slough.  Wound Descripti...    Assessments 01/23/2021  1:00 PM 03/04/2021  7:21 PM  Dressing Type Foam - Lift dressing to assess site every shift Moisture barrier  Dressing Changed;Clean;Dry;Intact Clean;Dry;Intact  Dressing Change Frequency Every 3 days Every  3 days  State of Healing -- Epithelialized  Site / Wound Assessment Bleeding;Red Clean;Dry  Peri-wound Assessment Intact Intact  Wound Length (cm) 0.5 cm --  Wound Width (cm) 1 cm --  Wound Depth (cm) 0 cm --  Wound Surface Area (cm^2) 0.5 cm^2 --  Wound Volume (cm^3) 0 cm^3 --  Tunneling (cm) 0 --  Margins Unattached edges (unapproximated) Attached edges (approximated)  Drainage Amount Minimal None  Drainage Description Sanguineous Sanguineous  Treatment Cleansed;Other (Comment) Cleansed     No Linked orders to display     Subjective: Unresponsive  Physical Exam: Vitals:   03/05/21 0331 03/05/21 0451 03/05/21 0838 03/05/21 1132  BP:      Pulse:   97 90  Resp:   13 16  Temp:      TempSrc:      SpO2:  96% 94%   Weight: 82.1 kg     Height:       Constitutional: No acute distress, back in progress, unresponsive and not opening Respiratory: #6.0 Shiley cuffless trach,  Lung sounds more coarse bilaterally,, weak wet sounding cough noted, trach with thick tan secretions.  O2 sats between 97 and 99% -Cheyne-Stokes respiratory pattern today with apnea-ration of apnea very short-term and no O2 desaturations noted Cardiovascular: S1-S2, NSR, normotensive -no bilateral lower extremity edema  Abdomen:  Abdomen soft and nontender.  PEG tube -normoactive bowels -rectal Foley in place draining dark thick brown stool Neurologic: Upper extremities are flaccid to PROM.-No further hypertonicity of lower extremities elicited with PROM.  Psychiatric: Opens eyes to tactile stimulation.  Does not track or make any  efforts to communicate.  Nonverbal secondary to trach tube    Data Reviewed:  There are no new results to review at this time.  Family Communication: No family at the bedside  Disposition: Remains inpatient appropriate because:  Unsafe discharge plan.  Patient in persistent vegetative state per stroke needs placement at skilled nursing facility that is trach capable-medically stable for discharge noting trach has been in place now for 30 days   Barriers to discharge: Social: Current insurance coverage about to expire and he otherwise has no funding for long-term care.  Financial services clarifying regarding eligibility for disability and Medicaid   Clinical: Tracheostomy and PEG dependent with recurrent tracheobronchitis; remains a full code  Level of care:  Med-Surg    Code Status: Full code  COVID vaccination status: Unknown  Planned Discharge Destination: Skilled nursing facility  DVT prophylaxis: SCDs/SQ Heparin   Time spent: 15 minutes  Author: Erin Hearing, NP 03/05/2021 11:44 AM  For on call review [email or web address].

## 2021-03-05 NOTE — Assessment & Plan Note (Signed)
-  Continue Urecholine -Foley catheter discontinued on 1/11 and patient voiding easily

## 2021-03-05 NOTE — Assessment & Plan Note (Addendum)
Continue Norvasc Coreg, hydralazine, and clonidine Current BP readings in the controlled range

## 2021-03-05 NOTE — Assessment & Plan Note (Signed)
Resolved

## 2021-03-05 NOTE — Assessment & Plan Note (Addendum)
-  Respiratory status has been more stable without recurrent episodes of Cheyne-Stokes pattern -Continue Zyprexa, baclofen, Symmetrel and scheduled Oxy IR for sequela of brain injury  A physical therapy consult is indicated based on the patients mobility assessment.   Mobility Assessment (last 72 hours)    Mobility Assessment    Row Name 03/29/21 2000 03/29/21 1800 03/29/21 1400 03/29/21 1200 03/29/21 0800   Does patient have an order for bedrest or is patient medically unstable Yes- Bedfast (Level 1) - Complete Yes- Bedfast (Level 1) - Complete Yes- Bedfast (Level 1) - Complete Yes- Bedfast (Level 1) - Complete Yes- Bedfast (Level 1) - Complete   What is the highest level of mobility based on the progressive mobility assessment? Level 1 (Bedfast) - Unable to balance while sitting on edge of bed -- Level 1 (Bedfast) - Unable to balance while sitting on edge of bed Level 1 (Bedfast) - Unable to balance while sitting on edge of bed Level 1 (Bedfast) - Unable to balance while sitting on edge of bed   Is the above level different from baseline mobility prior to current illness? No - Consider discontinuing PT/OT -- Yes - Recommend PT order Yes - Recommend PT order Yes - Recommend PT order   Row Name 03/28/21 2000 03/28/21 0800         Does patient have an order for bedrest or is patient medically unstable Yes- Bedfast (Level 1) - Complete Yes- Bedfast (Level 1) - Complete      What is the highest level of mobility based on the progressive mobility assessment? Level 1 (Bedfast) - Unable to balance while sitting on edge of bed Level 1 (Bedfast) - Unable to balance while sitting on edge of bed      Is the above level different from baseline mobility prior to current illness? No - Consider discontinuing PT/OT No - Consider discontinuing PT/OT

## 2021-03-06 LAB — GLUCOSE, CAPILLARY
Glucose-Capillary: 120 mg/dL — ABNORMAL HIGH (ref 70–99)
Glucose-Capillary: 121 mg/dL — ABNORMAL HIGH (ref 70–99)
Glucose-Capillary: 136 mg/dL — ABNORMAL HIGH (ref 70–99)
Glucose-Capillary: 137 mg/dL — ABNORMAL HIGH (ref 70–99)
Glucose-Capillary: 149 mg/dL — ABNORMAL HIGH (ref 70–99)
Glucose-Capillary: 167 mg/dL — ABNORMAL HIGH (ref 70–99)
Glucose-Capillary: 189 mg/dL — ABNORMAL HIGH (ref 70–99)

## 2021-03-06 LAB — COMPREHENSIVE METABOLIC PANEL
ALT: 109 U/L — ABNORMAL HIGH (ref 0–44)
AST: 43 U/L — ABNORMAL HIGH (ref 15–41)
Albumin: 2.2 g/dL — ABNORMAL LOW (ref 3.5–5.0)
Alkaline Phosphatase: 114 U/L (ref 38–126)
Anion gap: 11 (ref 5–15)
BUN: 68 mg/dL — ABNORMAL HIGH (ref 6–20)
CO2: 25 mmol/L (ref 22–32)
Calcium: 10 mg/dL (ref 8.9–10.3)
Chloride: 109 mmol/L (ref 98–111)
Creatinine, Ser: 0.94 mg/dL (ref 0.61–1.24)
GFR, Estimated: >60 mL/min (ref >60)
Glucose, Bld: 134 mg/dL — ABNORMAL HIGH (ref 70–99)
Potassium: 4 mmol/L (ref 3.5–5.1)
Sodium: 145 mmol/L (ref 135–145)
Total Bilirubin: 0.4 mg/dL (ref 0.3–1.2)
Total Protein: 6.8 g/dL (ref 6.5–8.1)

## 2021-03-06 NOTE — Progress Notes (Addendum)
Progress Note   Patient: Stephen Delgado [id number] DOB: [birth year] DOA: 12/30/2020     66 DOS: the patient was seen and examined on 03/06/2021   Brief hospital course: 56 y.o. male with PMH significant for DM2, HTN, CKD with questionable compliance to medications Patient presented to the ED on 12/30/2020 with complaint of headache, altered mental status   In the ED, he was hypertensive to 238/160, agitated, required restraints CT head showed large amount of intraventricular hemorrhage involving lateral/third/fourth ventricle, ICH and left basal ganglia as well as chronic microvascular ischemic changes. He was started on Cleviprex drip and admitted to neuro ICU   He subsequently had CT head repeated on 11/30, 12/1 and 12/3, all demonstrated unchanged intraparenchymal hemorrhage and intraventricular hemorrhage 12/3, MRI brain from also showed numerous small acute infarcts in both cerebral hemisphere with minimal improvement in the right superior cerebellum 12/3, intubated 12/13, tracheostomy 12/15, wean from vent to trach collar 12/16, PEG tube placement 12/16, repeat MRI showed an increase in the size and number of acute infarcts in the bilateral hemispheres with increased involvement in the cerebellum bilaterally.   12/18, transferred from neuro service to [facility]  12/21, palliative care consulted.  Family wanted to continue aggressive care as full CODE STATUS. 12/23 developed Cheyne-Stokes respiratory pattern with respiratory alkalosis.  Transferred to ICU for short-term mechanical ventilation and was found to have new tracheobronchitis now on cefepime 12/25 back to progressive floor and to [facility]   His hospital course has been complicated by significant neurological impairment, aspiration events, AKI, hypernatremia, urinary retention. 1/9 trach changed to #6.0 Shiley cuffless--1/10 > 30 days since trach inserted.  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  Discussed with pulmonary medicine and ID and recommendation was to not give antibiotics.  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 (HCC) -A1c 6.3 in 12/31/2020 -Continue Levemir to 24 units BID; continue SSI every 4 hours -CBGs well controlled  Malignant hypertension- (present on admission) Continue max dose Coreg, hydralazine, Norvasc and clonidine  Acute kidney injury on CKD 3b/acute azotemia with mild hypernatremia- (present on admission) -Baseline renal function: BUN 18 and cr 1.49 -Recent cr 1.18, BUN of 64 -Na 143-continue free water 250 cc q 4 hrs  Protein-calorie malnutrition/dysphagia due to recent stroke Nutrition Problem: Inadequate oral intake Etiology: lethargy/confusion, dysphagia Signs/Symptoms: NPO status Interventions: Tube feeding via PEG Body mass index is 23.33 kg/m.     Pneumonia due to Klebsiella pneumoniae ([place]) Resolved  Transaminitis -CT abdomen and pelvis 12/23 unremarkable no  evidence of cirrhosis or other biliary tree issues -Peak AST 74 and ALT 243.  By 1/20 AST 56 and ALT 155 after Zyprexa dose lowered -Hepatitis panel unremarkable  MSSA (  methicillin susceptible Staphylococcus aureus) pneumonia ([place]) Resolved  Pneumonia due to Serratia marcescens [facility]) Resolved  Acute urinary retention -Continue Urecholine -Foley catheter discontinued on 1/11 and patient voiding easily  Pressure injury of skin Resolved-prior anus ulcer        Subjective: Eyes are closed and he is essentially unresponsive to verbal or tactile stimulation today  Physical Exam: Vitals:   03/06/21 0738 03/06/21 0750 03/06/21 1115 03/06/21 1122  BP:  (!) 143/87 (!) 148/84 124/83  Pulse: 91 94 83 81  Resp: 18 20 18 12   Temp:  98 F (36.7 C)  98.2 F (36.8 C)  TempSrc:  Axillary  Axillary  SpO2: 95% 95% 93% 93%  Weight:      Height:       Constitutional: No acute distress, back in progress, unresponsive and not opening Respiratory: #6.0 Shiley cuffless trach,  Lung sounds more coarse bilaterally,, weak wet sounding cough noted, trach with thick tan secretions.  O2 sats between 97 and 99% -Cheyne-Stokes respiratory pattern today with apnea episodes lasting about 12 to 15 seconds with O2 sats decreasing to 92% Cardiovascular: S1-S2, NSR, normotensive -no bilateral lower extremity edema  Abdomen:  Abdomen soft and nontender.  PEG tube -normoactive bowels -LBM 2/1 Neurologic: Upper extremities are flaccid to PROM.-No further hypertonicity of lower extremities elicited with PROM.  Psychiatric: Opens eyes to tactile stimulation.  Does not track or make any efforts to communicate.  Nonverbal secondary to trach tube    Data Reviewed:  There are no new results to review at this time.  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  COVID vaccination status: Unknown  Consultants: Neurology PCCM Medicine   Procedures: Echocardiogram EEG Cortrack PEG tube placement Tracheostomy tube placement    Planned Discharge Destination: Skilled nursing facility     Time spent: 15 minutes  Author: Erin Hearing, NP 03/06/2021 12:13 PM  For on call review [email or web address].

## 2021-03-06 NOTE — Procedures (Signed)
Tracheostomy Change Note  Patient Details:   Name: Stephen Delgado DOB: 1965-05-13 MRN: [id number]    Airway Documentation:  Routine trach tube change done. #6 Shiley flexible cuffless trach placed without issues. Positive color change on ETCO2 detector. VSS.   Evaluation  O2 sats: stable throughout Complications: No apparent complications Patient did tolerate procedure well. Bilateral Breath Sounds: Rhonchi    Suszanne Conners 03/06/2021, 11:17 AM

## 2021-03-06 NOTE — Plan of Care (Signed)
?  Problem: Education: ?Goal: Knowledge of General Education information will improve ?Description: Including pain rating scale, medication(s)/side effects and non-pharmacologic comfort measures ?Outcome: Progressing ?  ?Problem: Health Behavior/Discharge Planning: ?Goal: Ability to manage health-related needs will improve ?Outcome: Progressing ?  ?Problem: Clinical Measurements: ?Goal: Ability to maintain clinical measurements within normal limits will improve ?Outcome: Progressing ?Goal: Will remain free from infection ?Outcome: Progressing ?Goal: Diagnostic test results will improve ?Outcome: Progressing ?Goal: Respiratory complications will improve ?Outcome: Progressing ?Goal: Cardiovascular complication will be avoided ?Outcome: Progressing ?  ?Problem: Activity: ?Goal: Risk for activity intolerance will decrease ?Outcome: Progressing ?  ?Problem: Nutrition: ?Goal: Adequate nutrition will be maintained ?Outcome: Progressing ?  ?Problem: Coping: ?Goal: Level of anxiety will decrease ?Outcome: Progressing ?  ?Problem: Elimination: ?Goal: Will not experience complications related to bowel motility ?Outcome: Progressing ?Goal: Will not experience complications related to urinary retention ?Outcome: Progressing ?  ?Problem: Pain Managment: ?Goal: General experience of comfort will improve ?Outcome: Progressing ?  ?Problem: Safety: ?Goal: Ability to remain free from injury will improve ?Outcome: Progressing ?  ?Problem: Skin Integrity: ?Goal: Risk for impaired skin integrity will decrease ?Outcome: Progressing ?  ?Problem: Education: ?Goal: Knowledge of disease or condition will improve ?Outcome: Progressing ?Goal: Knowledge of secondary prevention will improve (SELECT ALL) ?Outcome: Progressing ?Goal: Knowledge of patient specific risk factors will improve (INDIVIDUALIZE FOR PATIENT) ?Outcome: Progressing ?Goal: Individualized Educational Video(s) ?Outcome: Progressing ?  ?Problem: Coping: ?Goal: Will verbalize  positive feelings about self ?Outcome: Progressing ?Goal: Will identify appropriate support needs ?Outcome: Progressing ?  ?Problem: Health Behavior/Discharge Planning: ?Goal: Ability to manage health-related needs will improve ?Outcome: Progressing ?  ?Problem: Self-Care: ?Goal: Ability to participate in self-care as condition permits will improve ?Outcome: Progressing ?Goal: Verbalization of feelings and concerns over difficulty with self-care will improve ?Outcome: Progressing ?Goal: Ability to communicate needs accurately will improve ?Outcome: Progressing ?  ?Problem: Nutrition: ?Goal: Risk of aspiration will decrease ?Outcome: Progressing ?Goal: Dietary intake will improve ?Outcome: Progressing ?  ?Problem: Intracerebral Hemorrhage Tissue Perfusion: ?Goal: Complications of Intracerebral Hemorrhage will be minimized ?Outcome: Progressing ?  ?

## 2021-03-06 NOTE — Plan of Care (Signed)
  Problem: Nutrition: Goal: Risk of aspiration will decrease Outcome: Progressing   

## 2021-03-06 NOTE — Assessment & Plan Note (Addendum)
Continue routine trach care Does not require suctioning

## 2021-03-06 NOTE — Assessment & Plan Note (Signed)
Resolved

## 2021-03-06 NOTE — Progress Notes (Deleted)
Progress Note  =    Signed      Expand All Collapse All [facility] PROGRESS NOTE   [name] [id number] DOB: 1965-04-02 DOA: 12/30/2020 PCP: Patient, [name]   01/29/2021     01/29/2021                 02/04/2020   Status: Remains inpatient appropriate because:  Unsafe discharge plan.  Patient in persistent vegetative state per stroke needs placement at skilled nursing facility that is trach capable-medically stable for discharge noting trach has been in place now for 30 days   Barriers to discharge: Social: Current insurance coverage about to expire and he otherwise has no funding for long-term care.  Financial services clarifying regarding eligibility for disability and Medicaid   Clinical: Tracheostomy and PEG dependent with recurrent tracheobronchitis; remains a full code   Level of care:  Med-Surg      Code Status: Full code Family Communication:1/30 wife Stephen Delgado  DVT prophylaxis: SCDs COVID vaccination status: Unknown   Foley catheter: 27 French inserted on 12/17; changed on 12/24 POA: No   HPI: 56 y.o. male with PMH significant for DM2, HTN, CKD with questionable compliance to medications Patient presented to the ED on 12/30/2020 with complaint of headache, altered mental status   In the ED, he was hypertensive to 238/160, agitated, required restraints CT head showed large amount of intraventricular hemorrhage involving lateral/third/fourth ventricle, ICH and left basal ganglia as well as chronic microvascular ischemic changes. He was started on Cleviprex drip and admitted to neuro ICU   He subsequently had CT head repeated on 11/30, 12/1 and 12/3, all demonstrated unchanged intraparenchymal hemorrhage and intraventricular hemorrhage 12/3, MRI brain from also showed numerous small acute infarcts in both cerebral hemisphere with minimal improvement in the right superior cerebellum 12/3, intubated 12/13, tracheostomy 12/15, wean  from vent to trach collar 12/16, PEG tube placement 12/16, repeat MRI showed an increase in the size and number of acute infarcts in the bilateral hemispheres with increased involvement in the cerebellum bilaterally.   12/18, transferred from neuro service to [facility]  12/21, palliative care consulted.  Family wanted to continue aggressive care as full CODE STATUS. 12/23 developed Cheyne-Stokes respiratory pattern with respiratory alkalosis.  Transferred to ICU for short-term mechanical ventilation and was found to have new tracheobronchitis now on cefepime 12/25 back to progressive floor and to [facility]   His hospital course has been complicated by significant neurological impairment, aspiration events, AKI, hypernatremia, urinary retention. 1/9 trach changed to #6.0 Shiley cuffless--1/10 > 30 days since trach inserted.   Subjective: Eyes are closed and he is essentially unresponsive to verbal or tactile stimulation today.  Objective:     Vitals:    03/04/21 0306 03/04/21 0439  BP: (!) 159/99 (!) 157/88  Pulse: 98 98  Resp: 18 20  Temp:   98.1 F (36.7 C)  SpO2: 93%        Intake/Output Summary (Last 24 hours) at 03/04/2021 0741 Last data filed at 03/04/2021 1607    Gross per 24 hour  Intake 2977 ml  Output 1800 ml  Net 1177 ml         Filed Weights    02/20/21 0347 02/26/21 0500 02/28/21 0555  Weight: 79.8 kg 78.5 kg 78 kg      Exam:   Constitutional: No acute distress, back in progress, unresponsive and not opening Respiratory: #6.0 Shiley cuffless trach,  Lung sounds more coarse bilaterally,, weak wet sounding  cough noted, trach with thick tan secretions.  O2 sats between 97 and 99% -Cheyne-Stokes respiratory pattern today with apnea episodes lasting about 12 to 15 seconds with O2 sats decreasing to 92% Cardiovascular: S1-S2, NSR, normotensive -no bilateral lower extremity edema  Abdomen:  Abdomen soft and nontender.  PEG tube -normoactive bowels -LBM 2/1 Neurologic: Upper  extremities are flaccid to PROM.-No further hypertonicity of lower extremities elicited with PROM.  Psychiatric: Opens eyes to tactile stimulation.  Does not track or make any efforts to communicate.  Nonverbal secondary to trach tube     Assessment/Plan: Acute problems: Brain injury 2/2 acute intraparenchymal and intraventricular hemorrhage/Bilateral multifocal ischemic infarcts  -Has intermittent episodes of Kusmalls and Cheyne-Stokes respiratory patterns alternating with normal respiratory pattern -Continue baclofen, Symmetrel and scheduled Oxy IR  -Zyprexa at bedtime only.  Low-dose given recent elevation in LFTs-currently stable with AST's in the 60s and ALTs in the 150s -No real progress per OT   EOL discussion -per PMT 1/8 family requests that current level of care be continued including full CODE STATUS; they are amenable to additional discussions if patient's status declin   Acute respiratory failure with hypoxia 2/2 recurrent Serratia tracheobronchitis (recent MSSA and serratia pneumonia; recent Klebsiella tracheobronchitis); -Has had 2 episodes since admission and has completed antibiotics -CT sinuses: rhinosinusitis worse on the left- wife ported patient has history of chronic sinus issues   Loose stool/rectal tube -Continue Neutrisource fiber pack AND Imodium   Known malignant hypertension/EKG with LVH strain -Continue max dose Coreg, hydralazine, Norvasc and clonidine   AKI on CKD 3b/acute azotemia with mild hypernatremia -Baseline renal function: BUN 18 and cr 1.49 -Recent cr 1.18, BUN of 64 -Na 143-continue free water 250 cc q 4 hrs   Non obstructive transaminitis -CT abdomen and pelvis 12/23 unremarkable no evidence of cirrhosis or other biliary tree issues -Peak AST 74 and ALT 243.  By 1/20 AST 56 and ALT 155 after Zyprexa dose lowered -Hepatitis panel unremarkable   Type 2 diabetes mellitus -A1c 6.3 in 12/31/2020 -Continue Levemir to 24 units BID; continue SSI  every 4 hours -CBGs well controlled   Protein calorie malnutrition/dysphagia Nutrition Problem: Inadequate oral intake Etiology: lethargy/confusion, dysphagia Signs/Symptoms: NPO status Interventions: Tube feeding via PEG Body mass index is 23.33 kg/m.    Acute urinary retention  -Continue Urecholine -Foley catheter discontinued on 1/11 and patient voiding easily         Data Reviewed: Basic Metabolic Panel: Last Labs       Recent Labs  Lab 02/28/21 0141 03/03/21 0036  NA 151* 143  K 3.9 4.2  CL 114* 110  CO2 26 24  GLUCOSE 161* 174*  BUN 70* 64*  CREATININE 1.05 1.10  CALCIUM 10.2 9.7        CBC: Last Labs      Recent Labs  Lab 02/28/21 0141  WBC 16.3*  NEUTROABS 13.2*  HGB 10.5*  HCT 35.0*  MCV 79.4*  PLT 223          CBG: Last Labs          Recent Labs  Lab 03/03/21 1107 03/03/21 1645 03/03/21 1936 03/03/21 2257 03/04/21 0422  GLUCAP 150* 149* 125* 147* 158*            Scheduled Meds:  amantadine  100 mg Per Tube BID   amLODipine  2.5 mg Per Tube QHS   baclofen  10 mg Per Tube TID AC & HS   bethanechol  10 mg Per  Tube TID   carvedilol  25 mg Per Tube BID WC   chlorhexidine gluconate (MEDLINE KIT)  15 mL Mouth Rinse BID   Chlorhexidine Gluconate Cloth  6 each Topical Q0600   cholestyramine  4 g Per Tube TID   cloNIDine  0.2 mg Per Tube BID   fiber  1 packet Per Tube BID   free water  250 mL Per Tube Q4H   heparin injection (subcutaneous)  5,000 Units Subcutaneous Q8H   hydrALAZINE  100 mg Per Tube Q8H   insulin aspart  0-20 Units Subcutaneous Q4H   insulin detemir  24 Units Subcutaneous BID   loperamide HCl  1 mg Per Tube Daily   mouth rinse  15 mL Mouth Rinse 10 times per day   nutrition supplement (JUVEN)  1 packet Per Tube BID BM   OLANZapine  2.5 mg Per Tube QHS   oxyCODONE  2.5 mg Per Tube Q8H   pantoprazole sodium  40 mg Per Tube Daily   scopolamine  1 patch Transdermal Q72H    Continuous Infusions:  feeding  supplement (GLUCERNA 1.5 CAL) 1,000 mL (03/04/21 0629)      Principal Problem:   ICH (intracerebral hemorrhage) ([place]) Active Problems:   Malignant hypertension   Diabetes mellitus type 2 in obese ([place])   Acute respiratory failure ([place])   Tracheostomy dependence ([place])   Pneumonia due to Serratia marcescens ([place])   MSSA (methicillin susceptible Staphylococcus aureus) pneumonia ([place])   Stage 3b chronic kidney disease (CKD) ([place])   Acute kidney injury ([place])   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 ([place])   Dysphagia due to recent stroke   Acute urinary retention   Pressure injury of skin   Intractable hiccups   Pneumonia due to Klebsiella pneumoniae (HCC)   Fever   Muscular hypertonicity   Tracheobronchitis, chronic ([place])   Acute bacterial sinusitis   Transaminitis     Consultants: Neurology PCCM Medicine   Procedures: Echocardiogram EEG Cortrack PEG tube placement Tracheostomy tube placement   Antibiotics: Zosyn 12/15 through 12/21 Cefepime 12/24 through 12/27 Bactrim 12/27 >> 12/29 Cefepime 12/31 through 1/5     Time spent: 15 minutes       Erin Hearing ANP           [facility] 7 am - 330 pm/M-F for direct patient care and secure chat Please refer to [place] for contact info 64  days                          Electronically signed by Samella Parr, NP at 03/04/2021 10:40 AM ED to Hosp-Admission (Current) on 12/30/2020  ED to Hosp-Admission (Current) on 12/30/2020   Detailed Report  Note shared with patient Care Timeline  11/30   Admitted to [place] NEURO/TRAUMA/SURGICAL ICU from ED 0053  12/16   PERCUTANEOUS ENDOSCOPIC GASTROSTOMY (PEG) PLACEMENT  12/17   Transferred out of [facility] NEURO/TRAUMA/SURGICAL ICU 1538  12/23   Transferred to [place] NEURO/TRAUMA/SURGICAL ICU 1301  12/25   Transferred out of [place] NEURO/TRAUMA/SURGICAL ICU 1453     Patient: Stephen Delgado [id number]:[id number] DOB:  April 28, 1965 DOA: 12/30/2020     66 DOS: the patient was seen and examined on 03/06/2021   Brief hospital course: 56 y.o. male with PMH significant for DM2, HTN, CKD with questionable compliance to medications Patient presented to the ED on 12/30/2020 with complaint of headache, altered mental status   In  the ED, he was hypertensive to 238/160, agitated, required restraints CT head showed large amount of intraventricular hemorrhage involving lateral/third/fourth ventricle, ICH and left basal ganglia as well as chronic microvascular ischemic changes. He was started on Cleviprex drip and admitted to neuro ICU   He subsequently had CT head repeated on 11/30, 12/1 and 12/3, all demonstrated unchanged intraparenchymal hemorrhage and intraventricular hemorrhage 12/3, MRI brain from also showed numerous small acute infarcts in both cerebral hemisphere with minimal improvement in the right superior cerebellum 12/3, intubated 12/13, tracheostomy 12/15, wean from vent to trach collar 12/16, PEG tube placement 12/16, repeat MRI showed an increase in the size and number of acute infarcts in the bilateral hemispheres with increased involvement in the cerebellum bilaterally.   12/18, transferred from neuro service to [facility]  12/21, palliative care consulted.  Family wanted to continue aggressive care as full CODE STATUS. 12/23 developed Cheyne-Stokes respiratory pattern with respiratory alkalosis.  Transferred to ICU for short-term mechanical ventilation and was found to have new tracheobronchitis now on cefepime 12/25 back to progressive floor and to [facility]   His hospital course has been complicated by significant neurological impairment, aspiration events, AKI, hypernatremia, urinary retention. 1/9 trach changed to #6.0 Shiley cuffless--1/10 > 30 days since trach inserted.  Assessment and Plan: Brain injury 2/2 acute intraparenchymal and intraventricular hemorrhage/Bilateral multifocal ischemic infarcts with  assoc muscle hypertonicity  -Has intermittent episodes of Kusmalls and Cheyne-Stokes respiratory patterns alternating with normal respiratory pattern -Continue baclofen, Symmetrel and scheduled Oxy IR  -Zyprexa at bedtime only.  Low-dose given recent elevation in LFTs-currently stable with AST's in the 60s and ALTs in the 150s -No real progress per OT  Tracheobronchitis, chronic (HCC) -Has had 2 episodes since admission and has completed antibiotics -CT sinuses: rhinosinusitis worse on the left- wife ported patient has history of chronic sinus issues    Diabetes mellitus type 2 in obese (HCC) -A1c 6.3 in 12/31/2020 -Continue Levemir to 24 units BID; continue SSI every 4 hours -CBGs well controlled  Malignant hypertension- (present on admission) Continue max dose Coreg, hydralazine, Norvasc and clonidine  Acute kidney injury on CKD 3b/acute azotemia with mild hypernatremia- (present on admission) -Baseline renal function: BUN 18 and cr 1.49 -Recent cr 1.18, BUN of 64 -Na 143-continue free water 250 cc q 4 hrs  Protein-calorie malnutrition/dysphagia due to recent stroke Nutrition Problem: Inadequate oral intake Etiology: lethargy/confusion, dysphagia Signs/Symptoms: NPO status Interventions: Tube feeding via PEG Body mass index is 23.33 kg/m.     Pneumonia due to Klebsiella pneumoniae ([place]) Resolved  Transaminitis -CT abdomen and pelvis 12/23 unremarkable no evidence of cirrhosis or other biliary tree issues -Peak AST 74 and ALT 243.  By 1/20 AST 56 and ALT 155 after Zyprexa dose lowered -Hepatitis panel unremarkable  Acute urinary retention -Continue Urecholine -Foley catheter discontinued on 1/11 and patient voiding easily  Pressure injury of skin Pressure Injury 01/23/21 Anus Medial Stage 2 -  Partial thickness loss of dermis presenting as a shallow open injury with a red, pink wound bed without slough. Two red open areas approximately 0.5x1cm related to flexiseal  (Active)  Date First Assessed/Time First Assessed: 01/23/21 1300   Location: Anus  Location Orientation: Medial  Staging: Stage 2 -  Partial thickness loss of dermis presenting as a shallow open injury with a red, pink wound bed without slough.  Wound Descripti...    Assessments 01/23/2021  1:00 PM 03/04/2021  7:21 PM  Dressing Type Foam - Lift dressing to assess site every  shift Moisture barrier  Dressing Changed;Clean;Dry;Intact Clean;Dry;Intact  Dressing Change Frequency Every 3 days Every 3 days  State of Healing -- Epithelialized  Site / Wound Assessment Bleeding;Red Clean;Dry  Peri-wound Assessment Intact Intact  Wound Length (cm) 0.5 cm --  Wound Width (cm) 1 cm --  Wound Depth (cm) 0 cm --  Wound Surface Area (cm^2) 0.5 cm^2 --  Wound Volume (cm^3) 0 cm^3 --  Tunneling (cm) 0 --  Margins Unattached edges (unapproximated) Attached edges (approximated)  Drainage Amount Minimal None  Drainage Description Sanguineous Sanguineous  Treatment Cleansed;Other (Comment) Cleansed     No Linked orders to display     Subjective: Unresponsive  Physical Exam: Vitals:   03/06/21 0352 03/06/21 0733 03/06/21 0738 03/06/21 0750  BP: (!) 155/83 (!) 146/81  (!) 143/87  Pulse: 90 95 91 94  Resp: 16 (!) _0 Temp: 98.5 F (36.9 C)   98 F (36.7 C)  TempSrc: Oral   Axillary  SpO2: 94% 93% 95% 95%  Weight:      Height:       Constitutional: No acute distress, back in progress, unresponsive and not opening Respiratory: #6.0 Shiley cuffless trach,  Lung sounds more coarse bilaterally,, weak wet sounding cough noted, trach with thick tan secretions.  O2 sats between 97 and 99% -Cheyne-Stokes respiratory pattern today with apnea-ration of apnea very short-term and no O2 desaturations noted Cardiovascular: S1-S2, NSR, normotensive -no bilateral lower extremity edema  Abdomen:  Abdomen soft and nontender.  PEG tube -normoactive bowels -rectal Foley in place draining dark thick brown  stool Neurologic: Upper extremities are flaccid to PROM.-No further hypertonicity of lower extremities elicited with PROM.  Psychiatric: Opens eyes to tactile stimulation.  Does not track or make any efforts to communicate.  Nonverbal secondary to trach tube    Data Reviewed:  There are no new results to review at this time.  Family Communication: No family at the bedside  Disposition: Remains inpatient appropriate because:  Unsafe discharge plan.  Patient in persistent vegetative state per stroke needs placement at skilled nursing facility that is trach capable-medically stable for discharge noting trach has been in place now for 30 days   Barriers to discharge: Social: Current insurance coverage about to expire and he otherwise has no funding for long-term care.  Financial services clarifying regarding eligibility for disability and Medicaid   Clinical: Tracheostomy and PEG dependent with recurrent tracheobronchitis; remains a full code  Level of care:  Med-Surg    Code Status: Full code  COVID vaccination status: Unknown  Planned Discharge Destination: Skilled nursing facility  DVT prophylaxis: SCDs/SQ Heparin   Time spent: 15 minutes  Author: Erin Hearing, NP 03/06/2021 7:54 AM  For on call review [email or web address].

## 2021-03-07 DIAGNOSIS — I6782 Cerebral ischemia: Secondary | ICD-10-CM

## 2021-03-07 LAB — GLUCOSE, CAPILLARY
Glucose-Capillary: 114 mg/dL — ABNORMAL HIGH (ref 70–99)
Glucose-Capillary: 131 mg/dL — ABNORMAL HIGH (ref 70–99)
Glucose-Capillary: 141 mg/dL — ABNORMAL HIGH (ref 70–99)
Glucose-Capillary: 142 mg/dL — ABNORMAL HIGH (ref 70–99)
Glucose-Capillary: 151 mg/dL — ABNORMAL HIGH (ref 70–99)
Glucose-Capillary: 158 mg/dL — ABNORMAL HIGH (ref 70–99)
Glucose-Capillary: 177 mg/dL — ABNORMAL HIGH (ref 70–99)

## 2021-03-07 NOTE — Progress Notes (Signed)
PROGRESS NOTE    Stephen Delgado  YYT:[id number] DOB: 1965-09-20 DOA: 12/30/2020 PCP: Patient, [name]    Brief Narrative:  56 y.o. male with PMH significant for DM2, HTN, CKD with questionable compliance to medications Patient presented to the ED on 12/30/2020 with complaint of headache, altered mental status   In the ED, he was hypertensive to 238/160, agitated, required restraints CT head showed large amount of intraventricular hemorrhage involving lateral/third/fourth ventricle, ICH and left basal ganglia as well as chronic microvascular ischemic changes. He was started on Cleviprex drip and admitted to neuro ICU   He subsequently had CT head repeated on 11/30, 12/1 and 12/3, all demonstrated unchanged intraparenchymal hemorrhage and intraventricular hemorrhage 12/3, MRI brain from also showed numerous small acute infarcts in both cerebral hemisphere with minimal improvement in the right superior cerebellum 12/3, intubated 12/13, tracheostomy 12/15, wean from vent to trach collar 12/16, PEG tube placement 12/16, repeat MRI showed an increase in the size and number of acute infarcts in the bilateral hemispheres with increased involvement in the cerebellum bilaterally.   12/18, transferred from neuro service to [facility]  12/21, palliative care consulted.  Family wanted to continue aggressive care as full CODE STATUS. 12/23 developed Cheyne-Stokes respiratory pattern with respiratory alkalosis.  Transferred to ICU for short-term mechanical ventilation and was found to have new tracheobronchitis now on cefepime 12/25 back to progressive floor and to [facility]   His hospital course has been complicated by significant neurological impairment, aspiration events, AKI, hypernatremia, urinary retention. 1/9 trach changed to #6.0 Shiley cuffless--1/10 > 30 days since trach inserted    Consultants:  Neurology PCCM Medicine  Procedures:   Antimicrobials:      Subjective: Pt  sleeping , not opening his eyes, not cooperative withexam. Per nsg that is how he usually is.  Objective: Vitals:   03/07/21 0707 03/07/21 0756 03/07/21 0802 03/07/21 1112  BP: 138/83  138/83 137/80  Pulse: 88 89 90 89  Resp: 10 16 18 20   Temp: 98.3 F (36.8 C)   99 F (37.2 C)  TempSrc: Axillary   Axillary  SpO2: 94% 94% 94% 94%  Weight:      Height:        Intake/Output Summary (Last 24 hours) at 03/07/2021 1330 Last data filed at 03/07/2021 1100 Gross per 24 hour  Intake 970 ml  Output 2400 ml  Net -1430 ml   Filed Weights   02/26/21 0500 02/28/21 0555 03/05/21 0331  Weight: 78.5 kg 78 kg 82.1 kg    Examination:  General exam: Appears calm and comfortable , sleeping. Eyes closed Respiratory system: Anteriorly decreased breath sounds no wheezing Cardiovascular system: S1 & S2 heard, RRR. No no gallops Gastrointestinal system: Abdomen is nondistended, soft and nontender.  Central nervous system: Unable to assess Extremities: No edema Unable to assess for mood   Data Reviewed: I have personally reviewed following labs and imaging studies  CBC: No results for input(s): WBC, NEUTROABS, HGB, HCT, MCV, PLT in the last 168 hours. Basic Metabolic Panel: Recent Labs  Lab 03/03/21 0036 03/06/21 1533  NA 143 145  K 4.2 4.0  CL 110 109  CO2 24 25  GLUCOSE 174* 134*  BUN 64* 68*  CREATININE 1.10 0.94  CALCIUM 9.7 10.0   GFR: Estimated Creatinine Clearance: 95.2 mL/min (by C-G formula based on SCr of 0.94 mg/dL). Liver Function Tests: Recent Labs  Lab 03/01/21 0048 03/02/21 0234 03/06/21 1533  AST 68* 66* 43*  ALT 159*  157* 109*  ALKPHOS 99 108 114  BILITOT 0.3 0.4 0.4  PROT 7.1 6.9 6.8  ALBUMIN 2.2* 2.1* 2.2*   No results for input(s): LIPASE, AMYLASE in the last 168 hours. No results for input(s): AMMONIA in the last 168 hours. Coagulation Profile: No results for input(s): INR, PROTIME in the last 168 hours. Cardiac Enzymes: No results for input(s):  CKTOTAL, CKMB, CKMBINDEX, TROPONINI in the last 168 hours. BNP (last 3 results) No results for input(s): PROBNP in the last 8760 hours. HbA1C: No results for input(s): HGBA1C in the last 72 hours. CBG: Recent Labs  Lab 03/06/21 2009 03/06/21 2332 03/07/21 0329 03/07/21 0807 03/07/21 1157  GLUCAP 149* 136* 131* 114* 177*   Lipid Profile: No results for input(s): CHOL, HDL, LDLCALC, TRIG, CHOLHDL, LDLDIRECT in the last 72 hours. Thyroid Function Tests: No results for input(s): TSH, T4TOTAL, FREET4, T3FREE, THYROIDAB in the last 72 hours. Anemia Panel: No results for input(s): VITAMINB12, FOLATE, FERRITIN, TIBC, IRON, RETICCTPCT in the last 72 hours. Sepsis Labs: No results for input(s): PROCALCITON, LATICACIDVEN in the last 168 hours.  No results found for this or any previous visit (from the past 240 hour(s)).       Radiology Studies: No results found.      Scheduled Meds:  amantadine  100 mg Per Tube BID   amLODipine  2.5 mg Per Tube QHS   baclofen  10 mg Per Tube TID AC & HS   bethanechol  10 mg Per Tube TID   carvedilol  25 mg Per Tube BID WC   chlorhexidine  15 mL Mouth Rinse BID   cholestyramine  4 g Per Tube TID   cloNIDine  0.2 mg Per Tube BID   fiber  1 packet Per Tube BID   free water  250 mL Per Tube Q4H   heparin injection (subcutaneous)  5,000 Units Subcutaneous Q8H   hydrALAZINE  100 mg Per Tube Q8H   insulin aspart  0-20 Units Subcutaneous Q4H   insulin detemir  24 Units Subcutaneous BID   loperamide HCl  1 mg Per Tube Daily   mouth rinse  15 mL Mouth Rinse q12n4p   nutrition supplement (JUVEN)  1 packet Per Tube BID BM   OLANZapine  2.5 mg Per Tube QHS   oxyCODONE  2.5 mg Per Tube Q8H   pantoprazole sodium  40 mg Per Tube Daily   scopolamine  1 patch Transdermal Q72H   Continuous Infusions:  feeding supplement (GLUCERNA 1.5 CAL) 1,000 mL (03/07/21 [id number])    Assessment & Plan:   Active Problems:   Malignant hypertension   Diabetes  mellitus type 2 in obese (HCC)   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 (recent MSSA and serratia pneumonia; recent Klebsiella tracheobronchitis);   Pneumonia due to Serratia marcescens (HCC)   MSSA (methicillin susceptible Staphylococcus aureus) pneumonia (HCC)   Acute kidney injury on CKD 3b/acute azotemia with mild hypernatremia   Acute urinary retention   Pressure injury of skin   Pneumonia due to Klebsiella pneumoniae (HCC)   Transaminitis   Brain injury 2/2 acute intraparenchymal and intraventricular hemorrhage/Bilateral multifocal ischemic infarcts with assoc muscle hypertonicity    Protein-calorie malnutrition/dysphagia due to recent stroke   Brain injury 2/2 acute intraparenchymal and intraventricular hemorrhage/Bilateral multifocal ischemic infarcts with assoc muscle hypertonicity  -Has intermittent episodes of Kusmalls and Cheyne-Stokes respiratory patterns alternating with normal respiratory pattern -Continue baclofen, Symmetrel and scheduled Oxy IR  -Zyprexa at bedtime only.  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  Discussed with pulmonary medicine and ID and recommendation was to not give antibiotics.  2/4 reculture if develops fever    Diabetes mellitus type 2 in  obese (HCC) -A1c 6.3 in 12/31/2020 -Continue Levemir to 24 units BID; continue SSI every 4 hours -CBGs well controlled   Malignant hypertension- (present on admission) Continue max dose Coreg, hydralazine, Norvasc and clonidine   Acute kidney injury on CKD 3b/acute azotemia with mild hypernatremia- (present on admission) -Baseline renal function: BUN 18 and cr 1.49 -Recent cr 1.18, BUN of 64 -Na 143-continue free water 250 cc q 4 hrs   Protein-calorie malnutrition/dysphagia due to recent stroke Nutrition Problem: Inadequate oral intake Etiology: lethargy/confusion, dysphagia Signs/Symptoms: NPO status Interventions: Tube feeding via PEG Body mass index is 23.33 kg/m.      Pneumonia due to Klebsiella pneumoniae (HCC) Resolved   Transaminitis -CT abdomen and pelvis 12/23 unremarkable no evidence of cirrhosis or other biliary tree issues -Peak AST 74 and ALT 243.  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  SNF pending                LOS: 67 days   Time spent: 25 minutes    Lynn Ito, MD [facility] Pager [phone]  If 7PM-7AM, please contact night-coverage 03/07/2021, 1:30 PM

## 2021-03-07 NOTE — Plan of Care (Signed)
  Problem: Clinical Measurements: Goal: Will remain free from infection Outcome: Progressing   Problem: Activity: Goal: Risk for activity intolerance will decrease Outcome: Progressing   Problem: Nutrition: Goal: Adequate nutrition will be maintained Outcome: Progressing   Problem: Safety: Goal: Ability to remain free from injury will improve Outcome: Progressing   

## 2021-03-08 LAB — GLUCOSE, CAPILLARY
Glucose-Capillary: 142 mg/dL — ABNORMAL HIGH (ref 70–99)
Glucose-Capillary: 129 mg/dL — ABNORMAL HIGH (ref 70–99)
Glucose-Capillary: 181 mg/dL — ABNORMAL HIGH (ref 70–99)
Glucose-Capillary: 148 mg/dL — ABNORMAL HIGH (ref 70–99)
Glucose-Capillary: 131 mg/dL — ABNORMAL HIGH (ref 70–99)

## 2021-03-08 NOTE — Progress Notes (Signed)
PROGRESS NOTE    Stephen Delgado  FYT:[id number] DOB: [birth year] DOA: 12/30/2020 PCP: Patient, [name]    Brief Narrative:  56 y.o. male with PMH significant for DM2, HTN, CKD with questionable compliance to medications Patient presented to the ED on 12/30/2020 with complaint of headache, altered mental status   In the ED, he was hypertensive to 238/160, agitated, required restraints CT head showed large amount of intraventricular hemorrhage involving lateral/third/fourth ventricle, ICH and left basal ganglia as well as chronic microvascular ischemic changes. He was started on Cleviprex drip and admitted to neuro ICU   He subsequently had CT head repeated on 11/30, 12/1 and 12/3, all demonstrated unchanged intraparenchymal hemorrhage and intraventricular hemorrhage 12/3, MRI brain from also showed numerous small acute infarcts in both cerebral hemisphere with minimal improvement in the right superior cerebellum 12/3, intubated 12/13, tracheostomy 12/15, wean from vent to trach collar 12/16, PEG tube placement 12/16, repeat MRI showed an increase in the size and number of acute infarcts in the bilateral hemispheres with increased involvement in the cerebellum bilaterally.   12/18, transferred from neuro service to [facility]  12/21, palliative care consulted.  Family wanted to continue aggressive care as full CODE STATUS. 12/23 developed Cheyne-Stokes respiratory pattern with respiratory alkalosis.  Transferred to ICU for short-term mechanical ventilation and was found to have new tracheobronchitis now on cefepime 12/25 back to progressive floor and to [facility]   His hospital course has been complicated by significant neurological impairment, aspiration events, AKI, hypernatremia, urinary retention. 1/9 trach changed to #6.0 Shiley cuffless--1/10 > 30 days since trach inserted  2/5 no overnight issues  Consultants:  Neurology PCCM Medicine  Procedures:   Antimicrobials:       Subjective: Eyes closed.  Does not respond to my exam or questioning.     Objective: Vitals:   03/08/21 0756 03/08/21 1154 03/08/21 1200 03/08/21 1259  BP: (!) 157/86 (!) 152/81 98/63 136/83  Pulse: 92 87  85  Resp: 14 (!) 26  (!) 22  Temp: 98.6 F (37 C) 98.5 F (36.9 C)  98.6 F (37 C)  TempSrc: Axillary Axillary  Axillary  SpO2: 95% 92%  97%  Weight:      Height:        Intake/Output Summary (Last 24 hours) at 03/08/2021 1339 Last data filed at 03/08/2021 0500 Gross per 24 hour  Intake --  Output 800 ml  Net -800 ml   Filed Weights   02/26/21 0500 02/28/21 0555 03/05/21 0331  Weight: 78.5 kg 78 kg 82.1 kg    Examination: Calm, NAD Decreased breath sounds mild scattered rhonchi no wheezing Reg s1/s2 no gallop Soft benign +bs No edema Unable to assess neuro Mood and affect appropriate in current setting    Data Reviewed: I have personally reviewed following labs and imaging studies  CBC: No results for input(s): WBC, NEUTROABS, HGB, HCT, MCV, PLT in the last 168 hours. Basic Metabolic Panel: Recent Labs  Lab 03/03/21 0036 03/06/21 1533  NA 143 145  K 4.2 4.0  CL 110 109  CO2 24 25  GLUCOSE 174* 134*  BUN 64* 68*  CREATININE 1.10 0.94  CALCIUM 9.7 10.0   GFR: Estimated Creatinine Clearance: 95.2 mL/min (by C-G formula based on SCr of 0.94 mg/dL). Liver Function Tests: Recent Labs  Lab 03/02/21 0234 03/06/21 1533  AST 66* 43*  ALT 157* 109*  ALKPHOS 108 114  BILITOT 0.4 0.4  PROT 6.9 6.8  ALBUMIN 2.1* 2.2*  No results for input(s): LIPASE, AMYLASE in the last 168 hours. No results for input(s): AMMONIA in the last 168 hours. Coagulation Profile: No results for input(s): INR, PROTIME in the last 168 hours. Cardiac Enzymes: No results for input(s): CKTOTAL, CKMB, CKMBINDEX, TROPONINI in the last 168 hours. BNP (last 3 results) No results for input(s): PROBNP in the last 8760 hours. HbA1C: No results for input(s): HGBA1C in the last 72  hours. CBG: Recent Labs  Lab 03/07/21 2018 03/07/21 2353 03/08/21 0406 03/08/21 0832 03/08/21 1229  GLUCAP 151* 158* 142* 129* 181*   Lipid Profile: No results for input(s): CHOL, HDL, LDLCALC, TRIG, CHOLHDL, LDLDIRECT in the last 72 hours. Thyroid Function Tests: No results for input(s): TSH, T4TOTAL, FREET4, T3FREE, THYROIDAB in the last 72 hours. Anemia Panel: No results for input(s): VITAMINB12, FOLATE, FERRITIN, TIBC, IRON, RETICCTPCT in the last 72 hours. Sepsis Labs: No results for input(s): PROCALCITON, LATICACIDVEN in the last 168 hours.  No results found for this or any previous visit (from the past 240 hour(s)).       Radiology Studies: No results found.      Scheduled Meds:  amantadine  100 mg Per Tube BID   amLODipine  2.5 mg Per Tube QHS   baclofen  10 mg Per Tube TID AC & HS   bethanechol  10 mg Per Tube TID   carvedilol  25 mg Per Tube BID WC   chlorhexidine  15 mL Mouth Rinse BID   cholestyramine  4 g Per Tube TID   cloNIDine  0.2 mg Per Tube BID   fiber  1 packet Per Tube BID   free water  250 mL Per Tube Q4H   heparin injection (subcutaneous)  5,000 Units Subcutaneous Q8H   hydrALAZINE  100 mg Per Tube Q8H   insulin aspart  0-20 Units Subcutaneous Q4H   insulin detemir  24 Units Subcutaneous BID   loperamide HCl  1 mg Per Tube Daily   mouth rinse  15 mL Mouth Rinse q12n4p   nutrition supplement (JUVEN)  1 packet Per Tube BID BM   OLANZapine  2.5 mg Per Tube QHS   oxyCODONE  2.5 mg Per Tube Q8H   pantoprazole sodium  40 mg Per Tube Daily   scopolamine  1 patch Transdermal Q72H   Continuous Infusions:  feeding supplement (GLUCERNA 1.5 CAL) 1,000 mL (03/08/21 0308)    Assessment & Plan:   Active Problems:   Malignant hypertension   Diabetes mellitus type 2 in obese (HCC)   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 (recent MSSA and serratia pneumonia; recent Klebsiella tracheobronchitis);    Pneumonia due to Serratia marcescens (HCC)   MSSA (methicillin susceptible Staphylococcus aureus) pneumonia (HCC)   Acute kidney injury on CKD 3b/acute azotemia with mild hypernatremia   Acute urinary retention   Pressure injury of skin   Pneumonia due to Klebsiella pneumoniae (HCC)   Transaminitis   Brain injury 2/2 acute intraparenchymal and intraventricular hemorrhage/Bilateral multifocal ischemic infarcts with assoc muscle hypertonicity    Protein-calorie malnutrition/dysphagia due to recent stroke   Brain injury 2/2 acute intraparenchymal and intraventricular hemorrhage/Bilateral multifocal ischemic infarcts with assoc muscle hypertonicity  -Has intermittent episodes of Kusmalls and Cheyne-Stokes respiratory patterns alternating with normal respiratory pattern -Continue baclofen, Symmetrel and scheduled Oxy IR  -Zyprexa at bedtime only.  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  Discussed with pulmonary medicine and ID and recommendation was to not give antibiotics.  2/5 reculture if develops fever       Diabetes mellitus type 2 in obese (HCC) -A1c 6.3 in 12/31/2020 -Continue Levemir to 24 units BID; continue SSI every 4 hours 2/5 overall BG stable    Malignant hypertension- (present on  admission) Continue max dose Coreg, hydralazine, Norvasc and clonidine   Acute kidney injury on CKD 3b/acute azotemia with mild hypernatremia- (present on admission) -Baseline renal function: BUN 18 and cr 1.49 -Recent cr 1.18, BUN of 64 -Na 143-continue free water 250 cc q 4 hrs   Protein-calorie malnutrition/dysphagia due to recent stroke Nutrition Problem: Inadequate oral intake Etiology: lethargy/confusion, dysphagia Signs/Symptoms: NPO status Interventions: Tube feeding via PEG Body mass index is 23.33 kg/m.      Pneumonia due to Klebsiella pneumoniae ([place]) Resolved   Transaminitis -CT abdomen and pelvis 12/23 unremarkable no evidence of cirrhosis or other biliary tree issues -Peak AST 74 and ALT 243.  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  SNF pending                LOS: 68 days   Time spent: 25 minutes    Nolberto Hanlon, MD [facility] Pager [phone]  If 7PM-7AM, please contact night-coverage 03/08/2021, 1:39 PM

## 2021-03-08 NOTE — Plan of Care (Signed)
°  Problem: Nutrition: Goal: Adequate nutrition will be maintained Outcome: Progressing   Problem: Elimination: Goal: Will not experience complications related to bowel motility Outcome: Progressing   Problem: Safety: Goal: Ability to remain free from injury will improve Outcome: Progressing   Problem: Skin Integrity: Goal: Risk for impaired skin integrity will decrease Outcome: Progressing   Problem: Nutrition: Goal: Risk of aspiration will decrease Outcome: Progressing

## 2021-03-09 LAB — GLUCOSE, CAPILLARY
Glucose-Capillary: 121 mg/dL — ABNORMAL HIGH (ref 70–99)
Glucose-Capillary: 124 mg/dL — ABNORMAL HIGH (ref 70–99)
Glucose-Capillary: 133 mg/dL — ABNORMAL HIGH (ref 70–99)
Glucose-Capillary: 133 mg/dL — ABNORMAL HIGH (ref 70–99)
Glucose-Capillary: 139 mg/dL — ABNORMAL HIGH (ref 70–99)
Glucose-Capillary: 152 mg/dL — ABNORMAL HIGH (ref 70–99)
Glucose-Capillary: 157 mg/dL — ABNORMAL HIGH (ref 70–99)

## 2021-03-09 NOTE — Progress Notes (Signed)
Nutrition Follow-up  DOCUMENTATION CODES:   Not applicable  INTERVENTION:  Continue TF via PEG: Glucerna 1.5 @ 53m/hr (14480mday) Free water per MD, currently 25020mree water Q4H   Tube feeding regimen provides 2160 kcal, 118 grams of protein, and 1092 ml of H2O (2592m62mtal free water).  -1 packet Juven BID, each packet provides 95 calories, 2.5 grams of protein (collagen), and 9.8 grams of carbohydrate (3 grams sugar); also contains 7 grams of L-arginine and L-glutamine, 300 mg vitamin C, 15 mg vitamin E, 1.2 mcg vitamin B-12, 9.5 mg zinc, 200 mg calcium, and 1.5 g  Calcium Beta-hydroxy-Beta-methylbutyrate to support wound healing  NUTRITION DIAGNOSIS:   Inadequate oral intake related to lethargy/confusion, dysphagia as evidenced by NPO status.  ongoing  GOAL:   Patient will meet greater than or equal to 90% of their needs  Met with TF  MONITOR:   Diet advancement, Labs, Weight trends  REASON FOR ASSESSMENT:   Ventilator, Consult Enteral/tube feeding initiation and management  ASSESSMENT:   55 y28r old male who presented to the ED on 11/29 with AMS. PMH of HTN, T2DM, CKD stage III. 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  11/30 cortrak placed; tip gastric 12/2 trickle TF 12/3 pt intubated due to increased WOB, +emesis, TF held 12/4 trickle TF restarted 12/5 advancement orders placed 12/9 TF adjusted 12/13 s/p tracheostomy 12/16 s/p EGD and PEG placement 12/19 tx [facility] of ICU to [facility]/23 developed Cheyne-Stokes respiratory pattern with respiratory alkalosis.Transferred to ICU for short-term mechanical ventilation and was found to have new tracheobronchitis now on cefepime 12/25 tx back to [facility]9 trach changed to #6 shiley cuffless  1/10 marks 30 days since trach placed  Pt continues to be minimally responsive and tolerating ATC per CCM. Also continues to require TF via PEG and tolerating w/o issue.    Current TF: Glucerna 1.5 @ 60ml29mw/ 250ml 79m water Q4H  UOP: 1450ml x37mours I/O: +7653ml [id number]  Medications: SSI Q4H, 24 units levemir BID, imodium, nutrisource fiber BID, juven BID, protonix  Labs reviewed.  CBGs: [id number]:   Diet Order             Diet NPO time specified  Diet effective midnight                   EDUCATION NEEDS:   Not appropriate for education at this time  Skin:  Skin Assessment: Skin Integrity Issues: Skin Integrity Issues:: Stage II Stage II: anus  Last BM:  2/6  Height:   Ht Readings from Last 1 Encounters:  01/23/21 6' (1.829 m)    Weight:   Wt Readings from Last 1 Encounters:  03/05/21 82.1 kg   BMI:  Body mass index is 24.55 kg/m.  Estimated Nutritional Needs:   Kcal:  2150-2350  Protein:  105-120 grams  Fluid:  >2L     Langston [place] ATheone StanleyD, LDN (she/her/hers) RD pager number and weekend/on-call pager number located in [email or web address]

## 2021-03-09 NOTE — Progress Notes (Addendum)
NAME:  Stephen Delgado, MRN:  [id number], DOB:  March 08, 1965, LOS: 19 ADMISSION DATE:  12/30/2020, CONSULTATION DATE:  12/19 REFERRING MD:  Erlinda Hong, CHIEF COMPLAINT:  Dyspnea   History of Present Illness:  56 yo male presented with headache, nausea and altered mental status.  CT head showed acute Lt ICH with IVH likely related to hypertensive emergency (BP 195/116). Had persistent hypertension and concern for airway protection, and PCCM asked to assist with ICU management.  Tracheostomy 12/13.  Moved out of ICU.   Pertinent  Medical History  HTN, DM type 2, CKD 3a, OSA  Significant Hospital Events: Including procedures, antibiotic start and stop dates in addition to other pertinent events   11/29 Admit, start cleviprex 12/01 PCCM consulted; changed from cleviprex to cardene due to elevated triglycerides 12/03 intubated; episode of vomiting >> tube feeds held 12/04 resume trickle tube feeds 12/06 remains minimally responsive. Tolerating SBT.  12/12 no acute events overnight, T-max 102 point 12/13 bedside trach planned midmorning  12/14 ATC. Lasix --> 5L UOP  12/15 cont on trach collar. Na to 154 from 145. Adding low rate d5  12/16 scheduled for PEG. Cont D5. Continues on trach collar Out of ICU 12/19 12/23 Cheyne-Stokes respirations noted and was transferred to ICU for short-term ventilation.  12/25 Returned to floor 1/9 Trach changed to 6 cuffless  2/6 Trach follow up, no major changes, remains on ATC   Imaging/Studies: CT head 11/29 >> large amount of IVH involving lateral/3rd/4th ventricles, ICH in Lt BG, chronic microvascular ischemic changes Echo 11/30 >> EF 50 to 55%, mod LVH, grade 1 DD, ascending aorta 37 mm EEG 12/01 >> continuous generalized slowing MRI brain 12/03 >> numerous small acute infarcts in b/l cerebral hemispheres, Lt caudate hemorrhage, IVH, scattered sulcal SAH, mild communicating hydrocephalus CT head 12/23 Expected evolution of strokes, no shift or  herniation   Interim History / Subjective:  Seen lying in bed minimally responsive Family at bedside   Objective   Blood pressure (!) 148/89, pulse 87, temperature 98 F (36.7 C), resp. rate 16, height 6' (1.829 m), weight 82.1 kg, SpO2 98 %.    FiO2 (%):  [21 %] 21 %   Intake/Output Summary (Last 24 hours) at 03/09/2021 0740 Last data filed at 03/09/2021 [id number] Gross per 24 hour  Intake --  Output 1450 ml  Net -1450 ml    Filed Weights   02/26/21 0500 02/28/21 0555 03/05/21 0331  Weight: 78.5 kg 78 kg 82.1 kg   Physical exam  General: Acute on chronically ill appearing middle aged male lying in bed on ATC in NAD HEENT: 6 cuffless trach midline, MM pink/moist, PERRL,  Neuro: Eyes spontaneously open, unable to follow commands CV: s1s2 regular rate and rhythm, no murmur, rubs, or gallops,  PULM:  Faint rhonchi bilateral, on ATC collar with humidified room air  GI: soft, bowel sounds active in all 4 quadrants, non-tender, non-distended, tolerating TF Extremities: warm/dry, no edema  Skin: no rashes or lesions  Resolved Hospital Problem list     Assessment & Plan:   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 Kenton Kingfisher, NP-C [facility] Personal contact information can be found on Amion  03/09/2021, 7:42 AM

## 2021-03-09 NOTE — Progress Notes (Signed)
PROGRESS NOTE    Stephen Delgado  [id number] DOB: Feb 03, 1965 DOA: 12/30/2020 PCP: Patient, [name]    Brief Narrative:  56 y.o. male with PMH significant for DM2, HTN, CKD with questionable compliance to medications Patient presented to the ED on 12/30/2020 with complaint of headache, altered mental status   In the ED, he was hypertensive to 238/160, agitated, required restraints CT head showed large amount of intraventricular hemorrhage involving lateral/third/fourth ventricle, ICH and left basal ganglia as well as chronic microvascular ischemic changes. He was started on Cleviprex drip and admitted to neuro ICU   He subsequently had CT head repeated on 11/30, 12/1 and 12/3, all demonstrated unchanged intraparenchymal hemorrhage and intraventricular hemorrhage 12/3, MRI brain from also showed numerous small acute infarcts in both cerebral hemisphere with minimal improvement in the right superior cerebellum 12/3, intubated 12/13, tracheostomy 12/15, wean from vent to trach collar 12/16, PEG tube placement 12/16, repeat MRI showed an increase in the size and number of acute infarcts in the bilateral hemispheres with increased involvement in the cerebellum bilaterally.   12/18, transferred from neuro service to [facility]  12/21, palliative care consulted.  Family wanted to continue aggressive care as full CODE STATUS. 12/23 developed Cheyne-Stokes respiratory pattern with respiratory alkalosis.  Transferred to ICU for short-term mechanical ventilation and was found to have new tracheobronchitis now on cefepime 12/25 back to progressive floor and to [facility]   His hospital course has been complicated by significant neurological impairment, aspiration events, AKI, hypernatremia, urinary retention. 1/9 trach changed to #6.0 Shiley cuffless--1/10 > 30 days since trach inserted  2/6 no issues overnight  Consultants:  Neurology PCCM Medicine  Procedures:   Antimicrobials:       Subjective: No change in MS. Eyes closed.    Objective: Vitals:   03/09/21 0737 03/09/21 0800 03/09/21 1144 03/09/21 1200  BP:  (!) 158/96 (!) 158/96 131/85  Pulse:  87 87   Resp:  11 19   Temp:      TempSrc:      SpO2: 98% 96% 95%   Weight:      Height:        Intake/Output Summary (Last 24 hours) at 03/09/2021 1222 Last data filed at 03/09/2021 1211 Gross per 24 hour  Intake --  Output 1950 ml  Net -1950 ml   Filed Weights   02/26/21 0500 02/28/21 0555 03/05/21 0331  Weight: 78.5 kg 78 kg 82.1 kg    Examination: Calm, NAD, eyes closed Cta no w/r Reg s1/s2 no gallop Soft benign +bs No edema Unable to assess neuro    Data Reviewed: I have personally reviewed following labs and imaging studies  CBC: No results for input(s): WBC, NEUTROABS, HGB, HCT, MCV, PLT in the last 168 hours. Basic Metabolic Panel: Recent Labs  Lab 03/03/21 0036 03/06/21 1533  NA 143 145  K 4.2 4.0  CL 110 109  CO2 24 25  GLUCOSE 174* 134*  BUN 64* 68*  CREATININE 1.10 0.94  CALCIUM 9.7 10.0   GFR: Estimated Creatinine Clearance: 95.2 mL/min (by C-G formula based on SCr of 0.94 mg/dL). Liver Function Tests: Recent Labs  Lab 03/06/21 1533  AST 43*  ALT 109*  ALKPHOS 114  BILITOT 0.4  PROT 6.8  ALBUMIN 2.2*   No results for input(s): LIPASE, AMYLASE in the last 168 hours. No results for input(s): AMMONIA in the last 168 hours. Coagulation Profile: No results for input(s): INR, PROTIME in the last 168 hours. Cardiac  Enzymes: No results for input(s): CKTOTAL, CKMB, CKMBINDEX, TROPONINI in the last 168 hours. BNP (last 3 results) No results for input(s): PROBNP in the last 8760 hours. HbA1C: No results for input(s): HGBA1C in the last 72 hours. CBG: Recent Labs  Lab 03/08/21 1948 03/09/21 0007 03/09/21 0352 03/09/21 0757 03/09/21 1205  GLUCAP 131* 157* 139* 121* 133*   Lipid Profile: No results for input(s): CHOL, HDL, LDLCALC, TRIG, CHOLHDL, LDLDIRECT in the  last 72 hours. Thyroid Function Tests: No results for input(s): TSH, T4TOTAL, FREET4, T3FREE, THYROIDAB in the last 72 hours. Anemia Panel: No results for input(s): VITAMINB12, FOLATE, FERRITIN, TIBC, IRON, RETICCTPCT in the last 72 hours. Sepsis Labs: No results for input(s): PROCALCITON, LATICACIDVEN in the last 168 hours.  No results found for this or any previous visit (from the past 240 hour(s)).       Radiology Studies: No results found.      Scheduled Meds:  amantadine  100 mg Per Tube BID   amLODipine  2.5 mg Per Tube QHS   baclofen  10 mg Per Tube TID AC & HS   bethanechol  10 mg Per Tube TID   carvedilol  25 mg Per Tube BID WC   chlorhexidine  15 mL Mouth Rinse BID   cholestyramine  4 g Per Tube TID   cloNIDine  0.2 mg Per Tube BID   fiber  1 packet Per Tube BID   free water  250 mL Per Tube Q4H   heparin injection (subcutaneous)  5,000 Units Subcutaneous Q8H   hydrALAZINE  100 mg Per Tube Q8H   insulin aspart  0-20 Units Subcutaneous Q4H   insulin detemir  24 Units Subcutaneous BID   loperamide HCl  1 mg Per Tube Daily   mouth rinse  15 mL Mouth Rinse q12n4p   nutrition supplement (JUVEN)  1 packet Per Tube BID BM   OLANZapine  2.5 mg Per Tube QHS   oxyCODONE  2.5 mg Per Tube Q8H   pantoprazole sodium  40 mg Per Tube Daily   scopolamine  1 patch Transdermal Q72H   Continuous Infusions:  feeding supplement (GLUCERNA 1.5 CAL) 1,000 mL (03/08/21 2340)    Assessment & Plan:   Active Problems:   Malignant hypertension   Diabetes mellitus type 2 in obese (HCC)   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 (recent MSSA and serratia pneumonia; recent Klebsiella tracheobronchitis);   Pneumonia due to Serratia marcescens (HCC)   MSSA (methicillin susceptible Staphylococcus aureus) pneumonia (HCC)   Acute kidney injury on CKD 3b/acute azotemia with mild hypernatremia   Acute urinary retention   Pressure injury of  skin   Pneumonia due to Klebsiella pneumoniae (HCC)   Transaminitis   Brain injury 2/2 acute intraparenchymal and intraventricular hemorrhage/Bilateral multifocal ischemic infarcts with assoc muscle hypertonicity    Protein-calorie malnutrition/dysphagia due to recent stroke   Brain injury 2/2 acute intraparenchymal and intraventricular hemorrhage/Bilateral multifocal ischemic infarcts with assoc muscle hypertonicity  -Has intermittent episodes of Kusmalls and Cheyne-Stokes respiratory patterns alternating with normal respiratory pattern -Continue baclofen, Symmetrel and scheduled Oxy IR  -Zyprexa at bedtime only.  Low-dose given recent elevation in LFTs-currently stable with AST's in the 60s and ALTs in the 150s 2/6 no real appropriate progress per OT.       Tracheobronchitis, chronic (HCC) -Has had 2 episodes since admission and has completed antibiotics -CT sinuses: rhinosinusitis worse on the left- wife ported patient has history of chronic sinus issues 2/6 no acute  issues . Continue to monitor Pccm following Tracheosotomy dependent       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 (recent MSSA and serratia pneumonia; recent Klebsiella tracheobronchitis);- (present on admission) Continue routine trach care Recent Serratia positive sputum but patient asymptomatic without fever or other clinical signs of infection.  Discussed with pulmonary medicine and ID and recommendation was to not give  2/6 reculture if febrile      Diabetes mellitus type 2 in obese (HCC) -A1c 6.3 in 12/31/2020 -Continue Levemir to 24 units BID; continue SSI every 4 hours 2/6 BG overall stable.    Malignant hypertension- (present on admission) Continue max dose Coreg, hydralazine, Norvasc and clonidine   Acute kidney injury on CKD 3b/acute azotemia with mild hypernatremia- (present on admission) -Baseline renal function: BUN 18 and cr 1.49 Renal  function stable. NA stable.   Protein-calorie malnutrition/dysphagia due to recent stroke Nutrition Problem: Inadequate oral intake Etiology: lethargy/confusion, dysphagia Signs/Symptoms: NPO status Interventions: Tube feeding via PEG Body mass index is 23.33 kg/m.      Pneumonia due to Klebsiella pneumoniae ([place]) Resolved   Transaminitis -CT abdomen and pelvis 12/23 unremarkable no evidence of cirrhosis or other biliary tree issues -Peak AST 74 and ALT 243.  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  SNF pending                LOS: 69 days   Time spent: 20 minutes    Nolberto Hanlon, MD [facility] Pager [phone]  If 7PM-7AM, please contact night-coverage 03/09/2021, 12:22 PM

## 2021-03-09 NOTE — TOC Progression Note (Addendum)
Transition of Care [facility]) - Progression Note    Patient Details  Name: Stephen Delgado MRN: [id number] Date of Birth: 07/20/1965  Transition of [facility]) CM/SW Contact  [name], RN Phone Number: 03/09/2021, 10:24 AM  Clinical Narrative:    CM called and left a message with the patient's son, [name] on the phone to follow up and discuss transitions of care discussion with the patient's family.  Financial Counseling called and left a message with the patient's son on 03/03/2021 regarding patient's need to spend down assets in order to qualify for Medicaid eligibility.  I called and left a voicemail message with the patient's wife, [name] to discuss message regarding financial counseling.  CM and MSW will continue to follow the patient for TOC needs.   Expected Discharge Plan: Skilled Nursing Facility Barriers to Discharge: Continued Medical Work up  Expected Discharge Plan and Services Expected Discharge Plan: Skilled Nursing Facility   Discharge Planning Services: CM Consult Post Acute Care Choice: Skilled Nursing Facility Living arrangements for the past 2 months: Single Family Home                                       Social Determinants of Health (SDOH) Interventions    Readmission Risk Interventions Readmission Risk Prevention Plan 02/20/2021  Transportation Screening Complete  Medication Review (RN Care Manager) Complete  PCP or Specialist appointment within 3-5 days of discharge Not Complete  PCP/Specialist Appt Not Complete comments Patient will need LTC placement at [facility] facility.  HRI or Home Care Consult Complete  SW Recovery Care/Counseling Consult Complete  Palliative Care Screening Complete  Skilled Nursing Facility Complete  Some recent data might be hidden

## 2021-03-09 NOTE — Progress Notes (Addendum)
CSW spoke with MD regarding request for the return of Palliative services. MD will attempt to reach patient's wife and son and will place consult if family agreeable.  CSW spoke with [facility] who states the only facility that can accommodate a trach is in [place] but they do not accept LOG's.  Madilyn Fireman, MSW, LCSW Transitions of Care   Clinical Social Worker II [phone]

## 2021-03-10 DIAGNOSIS — E441 Mild protein-calorie malnutrition: Secondary | ICD-10-CM

## 2021-03-10 DIAGNOSIS — H159 Unspecified disorder of sclera: Secondary | ICD-10-CM

## 2021-03-10 LAB — CBC WITH DIFFERENTIAL/PLATELET
Abs Immature Granulocytes: 0.16 10*3/uL — ABNORMAL HIGH (ref 0.00–0.07)
Basophils Absolute: 0.1 10*3/uL (ref 0.0–0.1)
Basophils Relative: 1 %
Eosinophils Absolute: 0.4 10*3/uL (ref 0.0–0.5)
Eosinophils Relative: 4 %
HCT: 31.6 % — ABNORMAL LOW (ref 39.0–52.0)
Hemoglobin: 9.5 g/dL — ABNORMAL LOW (ref 13.0–17.0)
Immature Granulocytes: 2 %
Lymphocytes Relative: 20 %
Lymphs Abs: 2.1 10*3/uL (ref 0.7–4.0)
MCH: 23.1 pg — ABNORMAL LOW (ref 26.0–34.0)
MCHC: 30.1 g/dL (ref 30.0–36.0)
MCV: 76.9 fL — ABNORMAL LOW (ref 80.0–100.0)
Monocytes Absolute: 0.9 10*3/uL (ref 0.1–1.0)
Monocytes Relative: 8 %
Neutro Abs: 6.9 10*3/uL (ref 1.7–7.7)
Neutrophils Relative %: 65 %
Platelets: 256 10*3/uL (ref 150–400)
RBC: 4.11 MIL/uL — ABNORMAL LOW (ref 4.22–5.81)
RDW: 16.8 % — ABNORMAL HIGH (ref 11.5–15.5)
WBC: 10.5 10*3/uL (ref 4.0–10.5)
nRBC: 0 % (ref 0.0–0.2)

## 2021-03-10 LAB — COMPREHENSIVE METABOLIC PANEL
ALT: 88 U/L — ABNORMAL HIGH (ref 0–44)
AST: 38 U/L (ref 15–41)
Albumin: 2.4 g/dL — ABNORMAL LOW (ref 3.5–5.0)
Alkaline Phosphatase: 104 U/L (ref 38–126)
Anion gap: 13 (ref 5–15)
BUN: 53 mg/dL — ABNORMAL HIGH (ref 6–20)
CO2: 24 mmol/L (ref 22–32)
Calcium: 9.7 mg/dL (ref 8.9–10.3)
Chloride: 101 mmol/L (ref 98–111)
Creatinine, Ser: 0.96 mg/dL (ref 0.61–1.24)
GFR, Estimated: >60 mL/min (ref >60)
Glucose, Bld: 145 mg/dL — ABNORMAL HIGH (ref 70–99)
Potassium: 4.1 mmol/L (ref 3.5–5.1)
Sodium: 138 mmol/L (ref 135–145)
Total Bilirubin: 0.3 mg/dL (ref 0.3–1.2)
Total Protein: 6.7 g/dL (ref 6.5–8.1)

## 2021-03-10 LAB — GLUCOSE, CAPILLARY
Glucose-Capillary: 129 mg/dL — ABNORMAL HIGH (ref 70–99)
Glucose-Capillary: 136 mg/dL — ABNORMAL HIGH (ref 70–99)
Glucose-Capillary: 138 mg/dL — ABNORMAL HIGH (ref 70–99)
Glucose-Capillary: 145 mg/dL — ABNORMAL HIGH (ref 70–99)
Glucose-Capillary: 149 mg/dL — ABNORMAL HIGH (ref 70–99)
Glucose-Capillary: 151 mg/dL — ABNORMAL HIGH (ref 70–99)

## 2021-03-10 MED ORDER — POLYVINYL ALCOHOL 1.4 % OP SOLN
2.0000 [drp] | Freq: Four times a day (QID) | OPHTHALMIC | Status: DC
  Administered 2021-03-10 – 2021-05-22 (×292): 2 [drp] via OPHTHALMIC
  Filled 2021-03-10 (×3): qty 15

## 2021-03-10 NOTE — Progress Notes (Addendum)
DVT Prophylaxis  ., Scd's  Heparin injection 5,000 units       Progress Note   Patient: Stephen Delgado [id number] DOB: 08/12/1965 DOA: 12/30/2020     70 DOS: the patient was seen and examined on 03/10/2021   Brief hospital course: 56 y.o. male with PMH significant for DM2, HTN, CKD with questionable compliance to medications Patient presented to the ED on 12/30/2020 with complaint of headache, altered mental status   In the ED, he was hypertensive to 238/160, agitated, required restraints CT head showed large amount of intraventricular hemorrhage involving lateral/third/fourth ventricle, ICH and left basal ganglia as well as chronic microvascular ischemic changes. He was started on Cleviprex drip and admitted to neuro ICU   He subsequently had CT head repeated on 11/30, 12/1 and 12/3, all demonstrated unchanged intraparenchymal hemorrhage and intraventricular hemorrhage 12/3, MRI brain from also showed numerous small acute infarcts in both cerebral hemisphere with minimal improvement in the right superior cerebellum 12/3, intubated 12/13, tracheostomy 12/15, wean from vent to trach collar 12/16, PEG tube placement 12/16, repeat MRI showed an increase in the size and number of acute infarcts in the bilateral hemispheres with increased involvement in the cerebellum bilaterally.   12/18, transferred from neuro service to [facility]  12/21, palliative care consulted.  Family wanted to continue aggressive care as full CODE STATUS. 12/23 developed Cheyne-Stokes respiratory pattern with respiratory alkalosis.  Transferred to ICU for short-term mechanical ventilation and was found to have new tracheobronchitis now on cefepime 12/25 back to progressive floor and to [facility]   His hospital course has been complicated by significant neurological impairment, aspiration events, AKI, hypernatremia, urinary retention. 1/9 trach changed to #6.0 Shiley cuffless--1/10 > 30 days since trach  inserted.  Assessment and Plan: Brain injury 2/2 acute intraparenchymal and intraventricular hemorrhage/Bilateral multifocal ischemic infarcts with assoc muscle hypertonicity  -Has intermittent episodes of Kusmalls and Cheyne-Stokes respiratory patterns alternating with normal respiratory pattern -Continue baclofen, Symmetrel and scheduled Oxy IR  -Zyprexa at bedtime only.  Low-dose given recent elevation in LFTs but as of 2/7 LFTs have essentially normalized with AST 38 and ALT 88. 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  Discussed with pulmonary medicine and ID and recommendation was to not give antibiotics.  Would reculture if develops fever-2/7 low-grade but less than 100 F.  WBC is normal.  No indication to pursue respiratory infectious work-up.  Diabetes mellitus type 2 in obese (HCC) -A1c 6.3 in 12/31/2020 -Continue Levemir to 24 units BID; continue SSI every 4 hours -CBGs well controlled  Malignant hypertension- (present on admission) Continue max dose Coreg, hydralazine, Norvasc and clonidine  Acute kidney injury on CKD 3b/acute azotemia with mild hypernatremia- (present on admission) -Baseline renal function: BUN 18 and cr 1.49 -Recent cr 1.18, BUN of 64 -Na 143-continue free water 250 cc q 4 hrs  Left scleral lesion - Noted upon exam today; with associated injection -Continue to follow -Begin eye moisturizing gtts  -Unsure significance of dark lesion -May  benefit from eventual ophthalmological evaluation   Protein-calorie malnutrition/dysphagia due to recent stroke Nutrition Problem: Inadequate oral  intake Etiology: lethargy/confusion, dysphagia Signs/Symptoms: NPO status Interventions: Tube feeding via PEG Body mass index is 23.33 kg/m.     Transaminitis -CT abdomen and pelvis 12/23 unremarkable no evidence of cirrhosis or other biliary tree issues -Peak AST 74 and ALT 243.  By 1/20 AST 56 and ALT 155 after Zyprexa dose lowered further improvement noted on 2/7 with AST 38 and ALT 88 -Hepatitis panel unremarkable  Pneumonia due to Klebsiella pneumoniae [facility]) Resolved  Acute urinary retention -Continue Urecholine -Foley catheter discontinued on 1/11 and patient voiding easily  MSSA (methicillin susceptible Staphylococcus aureus) pneumonia (HCC) Resolved  Pressure injury of skin Resolved-prior anus ulcer  Pneumonia due to Serratia marcescens ([place]) Resolved        Subjective: Opens eyes to voice but no meaningful interaction  Physical Exam: Vitals:   03/10/21 0717 03/10/21 0725 03/10/21 1130 03/10/21 1143  BP: (!) 138/97  111/75   Pulse: 88 92 83 84  Resp: 11 15 16 16   Temp: 99.1 F (37.3 C)  98.3 F (36.8 C)   TempSrc: Axillary  Oral   SpO2: 95% 97% 99% 97%  Weight:      Height:       Constitutional: No acute distress, back in progress, unresponsive and not opening Respiratory: #6.0 Shiley cuffless trach,  Lung sounds more coarse bilaterally,, weak wet sounding cough noted, trach with thick tan secretions.  O2 sats between 97 and 99% -Cheyne-Stokes respiratory pattern today with apnea episodes lasting about 12 to 15 seconds with O2 sats decreasing to 92% Cardiovascular: S1-S2, NSR, normotensive -no bilateral lower extremity edema  Abdomen:  Abdomen soft and nontender.  PEG tube -normoactive bowels -LBM 2/1 Neurologic: Upper extremities are flaccid to PROM.-No further hypertonicity of lower extremities elicited  with PROM.  Psychiatric: Opens eyes to tactile stimulation.  Does not track or make any efforts to communicate.  Nonverbal secondary to trach tube    Data Reviewed:  There are no new results to review at this time.  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  COVID vaccination status: Unknown  Consultants: Neurology PCCM Medicine   Procedures: Echocardiogram EEG Cortrack PEG tube placement Tracheostomy tube placement    Planned Discharge Destination: Skilled nursing facility     Time spent: 15 minutes  Author: Erin Hearing, NP 03/10/2021 12:32 PM  For on call review [email or web address].

## 2021-03-10 NOTE — Assessment & Plan Note (Addendum)
Resolved

## 2021-03-10 NOTE — NC FL2 (Signed)
[place] LEVEL OF CARE SCREENING TOOL     IDENTIFICATION  Patient Name: Stephen Delgado Birthdate: 02-25-1965 Sex: male Admission Date (Current Location): 12/30/2020  [facility] and [place] Number:  Herbalist and Address:  [facility] 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[place])    Brain injury 2/2 acute intraparenchymal and intraventricular hemorrhage/Bilateral multifocal ischemic infarcts with assoc muscle hypertonicity  03/04/2021   Protein-calorie malnutrition/dysphagia due to recent stroke 03/04/2021   Transaminitis    Acute bacterial sinusitis    Tracheobronchitis, chronic (HCC)    Muscular hypertonicity    Fever    Pneumonia due to Klebsiella pneumoniae ([place])    Intractable hiccups    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   Pneumonia due to Serratia marcescens ([place]) 01/23/2021   MSSA (methicillin susceptible Staphylococcus aureus) pneumonia ([place]) 01/23/2021   Stage 3b chronic kidney disease (CKD) ([place]) 01/23/2021   Acute kidney injury on CKD 3b/acute azotemia with mild hypernatremia 01/23/2021   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 ([place]) 01/23/2021   Dysphagia due to recent stroke 01/23/2021   Acute urinary retention 01/23/2021   Pressure injury of skin 01/23/2021   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 (recent MSSA and serratia pneumonia; recent Klebsiella  tracheobronchitis);    Intraventricular hemorrhage ([place]) 12/30/2020   Diabetes mellitus type 2 in obese ([place]) 02/28/2016   Healthcare maintenance 02/08/2016   Sinus congestion 04/28/2015   Diabetes mellitus ([place]) 04/28/2015   Malignant hypertension 04/19/2015   Abnormal EKG 04/19/2015   Chronic kidney disease (CKD) 04/19/2015   Microcytosis 04/19/2015    Orientation RESPIRATION BLADDER Height & Weight      (unable to assess - disoriented x4 - trach - nonverbal)  Tracheostomy (humidified tracheostomy collar) External catheter Weight: 82.1 kg Height:  6' (182.9 cm)  BEHAVIORAL SYMPTOMS/MOOD NEUROLOGICAL BOWEL NUTRITION STATUS      Incontinent Feeding tube  AMBULATORY STATUS COMMUNICATION OF NEEDS Skin   Total Care Does not communicate (6 mm uncuffed tracheostomy) Normal   PU Stage 2 Dressing: No Dressing                   Personal Care Assistance Level of Assistance  Total care, Bathing, Feeding, Dressing Bathing Assistance: Maximum assistance Feeding assistance: Maximum assistance Dressing Assistance: Maximum assistance Total Care Assistance: Maximum assistance   Functional Limitations Info  Sight, Hearing, Speech Sight Info: Adequate Hearing Info: Adequate Speech Info: Impaired (Tracheostomy with trach collar)    SPECIAL CARE FACTORS FREQUENCY  PT (By licensed PT), OT (By licensed OT)     PT Frequency: 2-3 times per week OT Frequency: 2-3 times per week            Contractures Contractures Info: Not present    Additional Factors Info  Insulin Sliding Scale, Psychotropic, Allergies, Code Status Code Status Info: Full code Allergies Info: NKDA Psychotropic Info: Zyprexa Insulin Sliding Scale Info: See Discharge Summary       Current Medications (03/10/2021):  This is the current hospital active medication list Current Facility-Administered Medications  Medication Dose Route Frequency Provider Last Rate Last Admin   acetaminophen (TYLENOL) tablet 650 mg   650 mg Oral Q4H PRN Kerney Elbe, MD   650 mg at 02/17/21 1718   Or   acetaminophen (TYLENOL) 160 MG/5ML solution 650 mg  650 mg Per Tube Q4H PRN Kerney Elbe, MD   650 mg at 03/02/21 [id number]   Or   acetaminophen (TYLENOL) suppository 650 mg  650 mg Rectal Q4H PRN Kerney Elbe, MD   650 mg at 03/09/21 0012   albuterol (PROVENTIL) (2.5 MG/3ML) 0.083% nebulizer solution 2.5 mg  2.5 mg Nebulization Q6H PRN Spero Geralds, MD       amantadine (SYMMETREL) 50 MG/5ML solution 100 mg  100 mg Per Tube BID Samella Parr, NP   100 mg at 03/09/21 2327   amLODipine (NORVASC) tablet 2.5 mg  2.5 mg Per Tube QHS Samella Parr, NP   2.5 mg at 03/09/21 2326   baclofen (LIORESAL) tablet 10 mg  10 mg Per Tube TID AC & HS Samella Parr, NP   10 mg at 03/10/21 [id number]   bethanechol (URECHOLINE) tablet 10 mg  10 mg Per Tube TID Wendee Beavers T, MD   10 mg at 03/09/21 2326   carvedilol (COREG) tablet 25 mg  25 mg Per Tube BID WC Agarwala, Einar Grad, MD   25 mg at 03/09/21 1543   chlorhexidine (PERIDEX) 0.12 % solution 15 mL  15 mL Mouth Rinse BID Nolberto Hanlon, MD   15 mL at 03/09/21 2328   cholestyramine (QUESTRAN) packet 4 g  4 g Per Tube TID Samella Parr, NP   4 g at 03/09/21 2327   cloNIDine (CATAPRES) tablet 0.2 mg  0.2 mg Per Tube BID Samella Parr, NP   0.2 mg at 03/09/21 2326   docusate (COLACE) 50 MG/5ML liquid 100 mg  100 mg Per Tube Daily PRN Chesley Mires, MD       feeding supplement (GLUCERNA 1.5 CAL) liquid 1,000 mL  1,000 mL Per Tube Continuous Alekh, Kshitiz, MD 60 mL/hr at 03/09/21 1544 1,000 mL at 03/09/21 1544   fiber (NUTRISOURCE FIBER) 1 packet  1 packet Per Tube BID Maryjane Hurter, MD   1 packet at 03/09/21 2327   free water 250 mL  250 mL Per Tube Q4H Gonfa, Taye T, MD   250 mL at 03/10/21 0441   guaiFENesin (ROBITUSSIN) 100 MG/5ML liquid 5 mL  5 mL Per Tube Q4H PRN Hammons, Kimberly B, RPH   5 mL at 02/20/21 0848   heparin injection 5,000 Units  5,000 Units Subcutaneous Q8H Kipp Brood, MD   5,000 Units at 03/10/21 [id number]   hydrALAZINE (APRESOLINE) injection 10-40 mg  10-40 mg Intravenous Q6H PRN Cristal Generous, NP   20 mg at 02/04/21 0454   hydrALAZINE (APRESOLINE) tablet 100 mg  100 mg Per Tube Q8H Samella Parr, NP   100 mg at 03/10/21 [id number]   influenza vac split quadrivalent PF (FLUARIX) injection 0.5 mL  0.5 mL Intramuscular Prior to discharge Dahal, Marlowe Aschoff, MD       insulin aspart (novoLOG) injection 0-20 Units  0-20 Units Subcutaneous Q4H Cristal Generous, NP   3 Units at 03/10/21 0442   insulin detemir (LEVEMIR) injection 24 Units  24 Units Subcutaneous BID Samella Parr, NP   24 Units at 03/09/21 2330   labetalol (NORMODYNE) injection 5-20 mg  5-20 mg Intravenous Q10 min PRN Rosalin Hawking, MD   20 mg at 02/12/21 0957   loperamide HCl (IMODIUM) 1 MG/7.5ML suspension 1 mg  1 mg Per Tube Daily Samella Parr, NP   1 mg at 03/09/21 [id number]   MEDLINE mouth rinse  15 mL Mouth Rinse q12n4p Nolberto Hanlon, MD   15 mL at 03/09/21 1544   nutrition supplement (JUVEN) (JUVEN) powder packet 1 packet  1 packet Per Tube BID BM Alekh, Kshitiz, MD   1 packet at 03/09/21 1207   OLANZapine (ZYPREXA) tablet 2.5 mg  2.5 mg Per Tube QHS Samella Parr, NP   2.5 mg at 03/09/21 2326   ondansetron (ZOFRAN) injection 4 mg  4 mg Intravenous Q6H PRN Chesley Mires, MD       oxyCODONE (ROXICODONE) 5 MG/5ML solution 2.5 mg  2.5 mg Per Tube Q8H Samella Parr, NP   2.5 mg at 03/10/21 0637   pantoprazole sodium (PROTONIX) 40 mg/20 mL oral suspension 40 mg  40 mg Per Tube Daily Rosalin Hawking, MD   40 mg at 03/09/21 0827   polyethylene glycol (MIRALAX / GLYCOLAX) packet 17 g  17 g Per Tube Daily PRN Samella Parr, NP       scopolamine (TRANSDERM-SCOP) 1 MG/3DAYS 1.5 mg  1 patch Transdermal Q72H Samella Parr, NP   1.5 mg at 03/09/21 [id number]     Discharge Medications: Please see discharge summary for a list of discharge medications.  Relevant Imaging Results:  Relevant Lab  Results:   Additional Information SS# [id number]  Curlene Labrum, RN

## 2021-03-10 NOTE — TOC Progression Note (Addendum)
Transition of Care [facility]) - Progression Note    Patient Details  Name: Stephen Delgado MRN: [id number] Date of Birth: 09-Oct-1965  [facility] CM/SW Contact  [name], RN Phone Number: 03/10/2021, 10:51 AM  Clinical Narrative:    CM spoke with Martie Lee, CM at [facility] in [place], [place] and the facility has bed availability and willing to review clinicals - faxed clinicals to the facility at [phone].  The patient at this time has insurance coverage under [employer].  I called and left a message with the patient's wife in regards to transitions of care discussion.  [name] is aware the [facility] Team continues to explore options for SNF placement.  Late entry for 03/09/2021 - CM called and spoke with Dartha Lodge, CM with [employer] and she forwarded list of SNF facilities in the [place], [place], and [place] area to contact to explore options for SNF placement for the patient.  [facility] financial counseling continues to communicate with the family regarding Medicaid application screening and need to spend down assets at the home before [facility] availability per [facility] note.  CM and MSW with DTP Team continue to follow the patient for TOC needs.   Expected Discharge Plan: Skilled Nursing Facility Barriers to Discharge: Continued Medical Work up  Expected Discharge Plan and Services Expected Discharge Plan: Skilled Nursing Facility   Discharge Planning Services: CM Consult Post Acute Care Choice: Skilled Nursing Facility Living arrangements for the past 2 months: Single Family Home                                       Social Determinants of Health (SDOH) Interventions    Readmission Risk Interventions Readmission Risk Prevention Plan 02/20/2021  Transportation Screening Complete  Medication Review (RN Care Manager) Complete  PCP or Specialist appointment within 3-5 days of discharge Not Complete  PCP/Specialist  Appt Not Complete comments Patient will need LTC placement at [facility] facility.  HRI or Home Care Consult Complete  SW Recovery Care/Counseling Consult Complete  Palliative Care Screening Complete  Skilled Nursing Facility Complete  Some recent data might be hidden

## 2021-03-11 LAB — GLUCOSE, CAPILLARY
Glucose-Capillary: 129 mg/dL — ABNORMAL HIGH (ref 70–99)
Glucose-Capillary: 132 mg/dL — ABNORMAL HIGH (ref 70–99)
Glucose-Capillary: 141 mg/dL — ABNORMAL HIGH (ref 70–99)
Glucose-Capillary: 142 mg/dL — ABNORMAL HIGH (ref 70–99)
Glucose-Capillary: 147 mg/dL — ABNORMAL HIGH (ref 70–99)
Glucose-Capillary: 148 mg/dL — ABNORMAL HIGH (ref 70–99)

## 2021-03-11 MED ORDER — AMLODIPINE BESYLATE 5 MG PO TABS
5.0000 mg | ORAL_TABLET | Freq: Every day | ORAL | Status: DC
  Administered 2021-03-11 – 2021-03-22 (×12): 5 mg
  Filled 2021-03-11 (×12): qty 1

## 2021-03-11 NOTE — Progress Notes (Signed)
Progress Note   Patient: Stephen Delgado [id number] DOB: [birth year] DOA: 12/30/2020     71 DOS: the patient was seen and examined on 03/11/2021      Brief hospital course: 56 y.o. male with PMH significant for DM2, HTN, CKD with questionable compliance to medications Patient presented to the ED on 12/30/2020 with complaint of headache, altered mental status   In the ED, he was hypertensive to 238/160, agitated, required restraints CT head showed large amount of intraventricular hemorrhage involving lateral/third/fourth ventricle, ICH and left basal ganglia as well as chronic microvascular ischemic changes. He was started on Cleviprex drip and admitted to neuro ICU   He subsequently had CT head repeated on 11/30, 12/1 and 12/3, all demonstrated unchanged intraparenchymal hemorrhage and intraventricular hemorrhage 12/3, MRI brain from also showed numerous small acute infarcts in both cerebral hemisphere with minimal improvement in the right superior cerebellum 12/3, intubated 12/13, tracheostomy 12/15, wean from vent to trach collar 12/16, PEG tube placement 12/16, repeat MRI showed an increase in the size and number of acute infarcts in the bilateral hemispheres with increased involvement in the cerebellum bilaterally.   12/18, transferred from neuro service to [facility]  12/21, palliative care consulted.  Family wanted to continue aggressive care as full CODE STATUS. 12/23 developed Cheyne-Stokes respiratory pattern with respiratory alkalosis.  Transferred to ICU for short-term mechanical ventilation and was found to have new tracheobronchitis now on cefepime 12/25 back to progressive floor and to [facility]   His hospital course has been complicated by significant neurological impairment, aspiration events, AKI, hypernatremia, urinary retention. 1/9 trach changed to #6.0 Shiley cuffless--1/10 > 30 days since trach inserted.  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  Low-dose given recent elevation in LFTs but as of 2/7 LFTs have essentially normalized with AST 38 and ALT 88. 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  Discussed with pulmonary medicine and ID and recommendation was to not give antibiotics.  Would reculture if develops fever-2/7 low-grade but less than 100 F.  WBC is normal.  No indication to pursue respiratory infectious work-up.  Diabetes mellitus type 2 in obese (HCC) -A1c 6.3 in 12/31/2020 -Continue Levemir to 24 units BID; continue SSI every 4 hours -CBGs well controlled  Malignant hypertension- (present on admission) Continue Coreg, hydralazine, and clonidine 2/8 increase p.m. Norvasc to 5 mg  Acute kidney injury on CKD 3b/acute azotemia with mild hypernatremia- (present on admission) -Baseline renal function: BUN 18 and cr 1.49 -Recent cr 1.18, BUN of 64 -Na 143-continue free water 250 cc q 4 hrs  Left scleral lesion - Noted upon exam today; with associated injection -Continue to follow -Begin eye moisturizing gtts  -Unsure significance of dark lesion -May benefit from eventual ophthalmological  evaluation   Protein-calorie malnutrition/dysphagia due to recent stroke Nutrition Problem: Inadequate oral intake Etiology: lethargy/confusion, dysphagia Signs/Symptoms: NPO status Interventions:  Tube feeding via PEG Body mass index is 23.33 kg/m.     Transaminitis -CT abdomen and pelvis 12/23 unremarkable no evidence of cirrhosis or other biliary tree issues -Peak AST 74 and ALT 243.  By 1/20 AST 56 and ALT 155 after Zyprexa dose lowered further improvement noted on 2/7 with AST 38 and ALT 88 -Hepatitis panel unremarkable  Pneumonia due to Klebsiella pneumoniae [facility]) Resolved  Acute urinary retention -Continue Urecholine -Foley catheter discontinued on 1/11 and patient voiding easily  MSSA (methicillin susceptible Staphylococcus aureus) pneumonia (HCC) Resolved  Pressure injury of skin Resolved-prior anus ulcer  Pneumonia due to Serratia marcescens ([place]) Resolved        Subjective:  Nonresponsive-opens eyes but does not track  Physical Exam: Vitals:   03/11/21 0025 03/11/21 0404 03/11/21 0735 03/11/21 0815  BP: 121/74 (!) 141/79 (!) 147/87   Pulse: 92 95 89   Resp: 14 20 15    Temp: 98.7 F (37.1 C) 98.7 F (37.1 C) 98.3 F (36.8 C)   TempSrc: Axillary Axillary Oral   SpO2: 96% 95% 95% 96%  Weight:      Height:       Constitutional: No acute distress, calm Respiratory: #6.0 Shiley cuffless trach, room air- lung sounds more coarse bilaterally,, weak wet sounding cough noted, trach with thick tan secretions.  O2 sats between 97 and 99% -Cheyne-Stokes respiratory pattern but maintaining O2 sat Cardiovascular: S1-S2, NSR, normotensive -no bilateral lower extremity edema  Abdomen:  Abdomen soft and nontender.  PEG tube -normoactive bowels -LBM 2/7 Neurologic: Upper extremities are flaccid to PROM.-No further hypertonicity of lower extremities elicited with PROM.  Psychiatric: Opens eyes to tactile stimulation.  Does not track or make any efforts to  communicate.  Nonverbal secondary to trach tube    Data Reviewed: There are no new results to review at this time.  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  COVID vaccination status: Unknown  Consultants: Neurology PCCM Medicine   Procedures: Echocardiogram EEG Cortrack PEG tube placement Tracheostomy tube placement   Planned Discharge Destination:  Skilled nursing facility     Time spent: 15 minutes  Author: Erin Hearing, NP 03/11/2021 10:44 AM  For on call review [email or web address].

## 2021-03-12 LAB — GLUCOSE, CAPILLARY
Glucose-Capillary: 116 mg/dL — ABNORMAL HIGH (ref 70–99)
Glucose-Capillary: 131 mg/dL — ABNORMAL HIGH (ref 70–99)
Glucose-Capillary: 134 mg/dL — ABNORMAL HIGH (ref 70–99)
Glucose-Capillary: 144 mg/dL — ABNORMAL HIGH (ref 70–99)
Glucose-Capillary: 147 mg/dL — ABNORMAL HIGH (ref 70–99)
Glucose-Capillary: 147 mg/dL — ABNORMAL HIGH (ref 70–99)
Glucose-Capillary: 152 mg/dL — ABNORMAL HIGH (ref 70–99)

## 2021-03-12 NOTE — Progress Notes (Addendum)
Progress Note   Patient: Stephen Delgado [id number] DOB: February 10, 1965 DOA: 12/30/2020     72 DOS: the patient was seen and examined on 03/12/2021      Brief hospital course: 56 y.o. male with PMH significant for DM2, HTN, CKD with questionable compliance to medications Patient presented to the ED on 12/30/2020 with complaint of headache, altered mental status   In the ED, he was hypertensive to 238/160, agitated, required restraints CT head showed large amount of intraventricular hemorrhage involving lateral/third/fourth ventricle, ICH and left basal ganglia as well as chronic microvascular ischemic changes. He was started on Cleviprex drip and admitted to neuro ICU   He subsequently had CT head repeated on 11/30, 12/1 and 12/3, all demonstrated unchanged intraparenchymal hemorrhage and intraventricular hemorrhage 12/3, MRI brain from also showed numerous small acute infarcts in both cerebral hemisphere with minimal improvement in the right superior cerebellum 12/3, intubated 12/13, tracheostomy 12/15, wean from vent to trach collar 12/16, PEG tube placement 12/16, repeat MRI showed an increase in the size and number of acute infarcts in the bilateral hemispheres with increased involvement in the cerebellum bilaterally.   12/18, transferred from neuro service to [facility]  12/21, palliative care consulted.  Family wanted to continue aggressive care as full CODE STATUS. 12/23 developed Cheyne-Stokes respiratory pattern with respiratory alkalosis.  Transferred to ICU for short-term mechanical ventilation and was found to have new tracheobronchitis now on cefepime 12/25 back to progressive floor and to [facility]   His hospital course has been complicated by significant neurological impairment, aspiration events, AKI, hypernatremia, urinary retention. 1/9 trach changed to #6.0 Shiley cuffless--1/10 > 30 days since trach inserted.  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  Discussed with pulmonary medicine and ID and recommendation was to not give antibiotics.    Diabetes mellitus type 2 in obese (HCC) -A1c 6.3 in 12/31/2020 -Continue Levemir to 24 units BID; continue SSI every 4 hours -CBGs well controlled  Malignant hypertension- (present on admission) Continue Coreg, hydralazine, and clonidine 2/8 increased p.m. Norvasc to 5 mg with improved BP control  Acute kidney injury on CKD 3b/acute azotemia with mild hypernatremia- (present on admission) -Baseline renal function: BUN 18 and cr 1.49 -Recent cr 1.18, BUN of 64 -Na 143-continue free water 250 cc q 4 hrs  Left scleral lesion -Injection resolved with NS drops  -Unsure significance of dark lesion -May benefit from eventual ophthalmological evaluation after dc   Protein-calorie malnutrition/dysphagia due to recent stroke Nutrition Problem: Inadequate oral intake Etiology: lethargy/confusion, dysphagia Signs/Symptoms: NPO status Interventions: Tube feeding via PEG Body mass index is 23.33 kg/m.     Transaminitis -2/2  Zyprexa -Resolved  Pneumonia due to Klebsiella pneumoniae [facility]) Resolved  Acute urinary retention -Continue Urecholine -Foley catheter discontinued on 1/11 and patient voiding easily  MSSA (methicillin susceptible  Staphylococcus aureus) pneumonia ([place]) Resolved  Pressure injury of skin Resolved-prior anus ulcer  Pneumonia due to Serratia marcescens [facility]) Resolved        Subjective:  Unresponsive  Physical Exam: Vitals:   03/12/21 0326 03/12/21 0500 03/12/21 0722 03/12/21 0735  BP: 140/77  138/72   Pulse: 90  89 91  Resp: 14  16 18   Temp: 98.9 F (37.2 C)  98.6 F (37 C)   TempSrc: Axillary  Axillary   SpO2: 95%  90% 98%  Weight:  83.9 kg    Height:       Constitutional: No acute distress, calm Respiratory: #6.0 Shiley cuffless trach, room air- lung sounds more coarse bilaterally,O2 sats between 97 and 99% -Cheyne-Stokes respiratory pattern but maintaining O2 sat Cardiovascular: S1-S2, NSR, normotensive -no bilateral lower extremity edema  Abdomen:  Abdomen soft and nontender.  PEG tube -normoactive bowels -LBM 2/8 Neurologic: Upper extremities are flaccid to PROM.-No further hypertonicity of lower extremities elicited with PROM.  Psychiatric: Opens eyes to tactile stimulation.  Does not track or make any efforts to communicate.  Nonverbal secondary to trach tube    Data Reviewed: There are no new results to review at this time.  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   A physical therapy consult is indicated based on the patients mobility assessment.   Mobility Assessment (last 72 hours)     Mobility Assessment     Row Name 03/12/21 0735 03/11/21 2040 03/10/21 2000   Does patient have an order for bedrest  or is patient medically unstable Yes- Bedfast (Level 1) - Complete Yes- Bedfast (Level 1) - Complete Yes- Bedfast (Level 1) - Complete   What is the highest level of mobility based on the progressive mobility assessment? Level 1 (Bedfast) - Unable to balance while sitting on edge of bed Level 1 (Bedfast) - Unable to balance while sitting on edge of bed Level 1 (Bedfast) - Unable to balance while sitting on edge of bed   Is the above level different from baseline mobility prior to current illness? No - Consider discontinuing PT/OT No - Consider discontinuing PT/OT No - Consider discontinuing PT/OT    Row Name 03/09/21 2000       Does patient have an order for bedrest or is patient medically unstable No - Continue assessment     What is the highest level of mobility based on the progressive mobility assessment? Level 1 (Bedfast) - Unable to balance while sitting on edge of bed                COVID vaccination status: Unknown  Consultants: Neurology PCCM Medicine   Procedures: Echocardiogram EEG Cortrack PEG tube placement Tracheostomy tube placement   Planned Discharge Destination:  Skilled nursing facility     Time spent: 15 minutes  Author: Erin Hearing, NP 03/12/2021 10:00 AM  For on call review [email or web address].

## 2021-03-12 NOTE — Plan of Care (Signed)
?  Problem: Education: ?Goal: Knowledge of General Education information will improve ?Description: Including pain rating scale, medication(s)/side effects and non-pharmacologic comfort measures ?Outcome: Progressing ?  ?Problem: Health Behavior/Discharge Planning: ?Goal: Ability to manage health-related needs will improve ?Outcome: Progressing ?  ?Problem: Clinical Measurements: ?Goal: Ability to maintain clinical measurements within normal limits will improve ?Outcome: Progressing ?Goal: Will remain free from infection ?Outcome: Progressing ?Goal: Diagnostic test results will improve ?Outcome: Progressing ?Goal: Respiratory complications will improve ?Outcome: Progressing ?Goal: Cardiovascular complication will be avoided ?Outcome: Progressing ?  ?Problem: Activity: ?Goal: Risk for activity intolerance will decrease ?Outcome: Progressing ?  ?Problem: Nutrition: ?Goal: Adequate nutrition will be maintained ?Outcome: Progressing ?  ?Problem: Coping: ?Goal: Level of anxiety will decrease ?Outcome: Progressing ?  ?Problem: Elimination: ?Goal: Will not experience complications related to bowel motility ?Outcome: Progressing ?Goal: Will not experience complications related to urinary retention ?Outcome: Progressing ?  ?Problem: Pain Managment: ?Goal: General experience of comfort will improve ?Outcome: Progressing ?  ?Problem: Safety: ?Goal: Ability to remain free from injury will improve ?Outcome: Progressing ?  ?Problem: Skin Integrity: ?Goal: Risk for impaired skin integrity will decrease ?Outcome: Progressing ?  ?Problem: Education: ?Goal: Knowledge of disease or condition will improve ?Outcome: Progressing ?Goal: Knowledge of secondary prevention will improve (SELECT ALL) ?Outcome: Progressing ?Goal: Knowledge of patient specific risk factors will improve (INDIVIDUALIZE FOR PATIENT) ?Outcome: Progressing ?Goal: Individualized Educational Video(s) ?Outcome: Progressing ?  ?Problem: Coping: ?Goal: Will verbalize  positive feelings about self ?Outcome: Progressing ?Goal: Will identify appropriate support needs ?Outcome: Progressing ?  ?Problem: Health Behavior/Discharge Planning: ?Goal: Ability to manage health-related needs will improve ?Outcome: Progressing ?  ?Problem: Self-Care: ?Goal: Ability to participate in self-care as condition permits will improve ?Outcome: Progressing ?Goal: Verbalization of feelings and concerns over difficulty with self-care will improve ?Outcome: Progressing ?Goal: Ability to communicate needs accurately will improve ?Outcome: Progressing ?  ?Problem: Nutrition: ?Goal: Risk of aspiration will decrease ?Outcome: Progressing ?Goal: Dietary intake will improve ?Outcome: Progressing ?  ?Problem: Intracerebral Hemorrhage Tissue Perfusion: ?Goal: Complications of Intracerebral Hemorrhage will be minimized ?Outcome: Progressing ?  ?

## 2021-03-12 NOTE — Progress Notes (Signed)
Occupational Therapy Treatment Patient Details Name: Stephen Delgado MRN: [id number] DOB: August 15, 1965 Today's Date: 03/12/2021   History of present illness 56 y/o presented to [facility] ED on 11/29 for AMS, lethargy, and vomiting. Transferred to [facility]. CTH showed L caudate/BG hemorrhage with IVH. Declining respiratory status with snoring respirations. Intubated 12/3. Trach placed 12/13.on 12/16, repeat MRI showed an increase in the size and number of acute infarcts in the bilateral hemispheres with increased involvement in the cerebellum bilaterally. On 12/23, pt transferred to ICU for worsening respiratory status due to tracheobronchitis, requiring short term mechanical ventilation. PMH: HTN, sleep apnea, DM, CKD 3   OT comments  Patient seen by skilled OT to address family education.  Patient's wife and son provided education on performing BUE shoulder, elbow, wrist, and finger PROM exercises to address tone. Family was educated on UE positioning toi address edema and bed positioning.  Family demonstrated understanding and performed PROM exercises with verbal cues. Family educated on frequency to perform PROM and to address positioning daily.    Recommendations for follow up therapy are one component of a multi-disciplinary discharge planning process, led by the attending physician.  Recommendations may be updated based on patient status, additional functional criteria and insurance authorization.    Follow Up Recommendations  Skilled nursing-short term rehab (<3 hours/day)    Assistance Recommended at Discharge Frequent or constant Supervision/Assistance  Patient can return home with the following  Other (comment) (dependent)   Equipment Recommendations  Other (comment) (TBD)    Recommendations for Other Services      Precautions / Restrictions Precautions Precautions: Fall Precaution Comments: G-tube, trach collar, flexiseal, foley, BUE/pedal edema       Mobility Bed Mobility                     Transfers                         Balance                                           ADL either performed or assessed with clinical judgement   ADL                                              Extremity/Trunk Assessment Upper Extremity Assessment RUE Deficits / Details: axillary tone/contacture, tone noted proximal, flaccid distal LUE Deficits / Details: axillary tone/contacture, tone noted proximal, flaccid distal            Vision       Perception     Praxis      Cognition Arousal/Alertness: Lethargic Behavior During Therapy: Flat affect Overall Cognitive Status: Difficult to assess                                 General Comments: patient opened his eyes on occasion during treatment.  No facial expression to acknowledge understanding        Exercises Exercises: Other exercises, General Upper Extremity General Exercises - Upper Extremity Shoulder Flexion: PROM, Both, 10 reps, Supine Shoulder Extension: PROM, 10 reps, Both, Supine Shoulder ABduction: PROM, Both, 10 reps, Supine Shoulder ADduction: PROM, Both, 10  reps, Supine Elbow Flexion: PROM, Both, 10 reps, Supine Elbow Extension: PROM, Both, 10 reps, Supine Wrist Flexion: PROM, Both, 10 reps, Supine Wrist Extension: PROM, Both, 10 reps, Supine Digit Composite Flexion: PROM, Both, 10 reps, Supine Composite Extension: PROM, Both, Supine, 5 reps Other Exercises Other Exercises: family education on ROM exercises and UE positioning to address edema    Shoulder Instructions       General Comments      Pertinent Vitals/ Pain       Pain Assessment Pain Assessment: Faces Faces Pain Scale: No hurt Pain Intervention(s): Monitored during session  Home Living                                          Prior Functioning/Environment              Frequency  Min 1X/week        Progress Toward Goals  OT  Goals(current goals can now be found in the care plan section)  Progress towards OT goals: Progressing toward goals  Acute Rehab OT Goals OT Goal Formulation: Patient unable to participate in goal setting Time For Goal Achievement: 03/04/21 Potential to Achieve Goals: Poor ADL Goals Additional ADL Goal #1: Pt will follow 1 step commands 25% of the time as a precursor for ADLs Additional ADL Goal #2: pt will tolerate sitting EOB for at least 10 minutes with max A +2 with no adverse signs or symptoms Additional ADL Goal #3: pt's family will demonstrate appropriate pt positioning for skin integrity and edema control given verbal cues  Plan Discharge plan remains appropriate    Co-evaluation                 AM-PAC OT "6 Clicks" Daily Activity     Outcome Measure   Help from another person eating meals?: Total Help from another person taking care of personal grooming?: Total Help from another person toileting, which includes using toliet, bedpan, or urinal?: Total Help from another person bathing (including washing, rinsing, drying)?: Total Help from another person to put on and taking off regular upper body clothing?: Total Help from another person to put on and taking off regular lower body clothing?: Total 6 Click Score: 6    End of Session Equipment Utilized During Treatment: Oxygen  OT Visit Diagnosis: Other abnormalities of gait and mobility (R26.89);Muscle weakness (generalized) (M62.81);Low vision, both eyes (H54.2);Other symptoms and signs involving the nervous system (R29.898);Other symptoms and signs involving cognitive function;Cognitive communication deficit (R41.841) Symptoms and signs involving cognitive functions: Nontraumatic SAH;Other Nontraumatic ICH   Activity Tolerance Patient limited by lethargy   Patient Left in bed;with call bell/phone within reach;with bed alarm set   Nurse Communication Other (comment) (on family education for ROM and positioning)         Time: [id number] OT Time Calculation (min): 25 min  Charges: OT General Charges $OT Visit: 1 Visit OT Treatments $Therapeutic Activity: 23-37 mins  Lodema Hong, Anton  Pager [phone] Office [place] 03/12/2021, 3:43 PM

## 2021-03-12 NOTE — Plan of Care (Signed)
  Problem: Nutrition: Goal: Risk of aspiration will decrease Outcome: Progressing   

## 2021-03-12 NOTE — TOC Progression Note (Signed)
Transition of Care [facility]) - Progression Note    Patient Details  Name: RAINE ALWARDT MRN: [id number] Date of Birth: Jun 23, 1965  Transition of Care [facility]) CM/SW [place], RN Phone Number: 03/12/2021, 11:25 AM  Clinical Narrative:    CM called and spoke with Luisa Hart, CM at [facility] and updated her regarding exploration of skilled facilities for LTC placement.  I called and left a message with Gabriel Cirri, [place] at [facility] in [employer] [phone] / fax # [phone] to follow up regarding request for bed offer at the facility for LTC placement.  The facility is aware that the patient will need LTC placement.    CM and MSW with DTP Team will continue to follow the patient for SNF/ LTC placement.  Expected Discharge Plan: [place] Barriers to Discharge: Continued Medical Work up  Expected Discharge Plan and Services Expected Discharge Plan: [place]   Discharge Planning Services: CM Consult Post Acute Care Choice: [place] Living arrangements for the past 2 months: Single Family Home                                       Social Determinants of Health (SDOH) Interventions    Readmission Risk Interventions Readmission Risk Prevention Plan 02/20/2021  Transportation Screening Complete  Medication Review (RN Care Manager) Complete  PCP or Specialist appointment within 3-5 days of discharge Not Complete  PCP/Specialist Appt Not Complete comments Patient will need LTC placement at [facility] facility.  [place] or Home Care Consult Complete  SW Recovery Care/Counseling Consult Complete  Palliative Care Screening Complete  Skilled Nursing Facility Complete  Some recent data might be hidden

## 2021-03-13 LAB — GLUCOSE, CAPILLARY
Glucose-Capillary: 134 mg/dL — ABNORMAL HIGH (ref 70–99)
Glucose-Capillary: 130 mg/dL — ABNORMAL HIGH (ref 70–99)
Glucose-Capillary: 156 mg/dL — ABNORMAL HIGH (ref 70–99)
Glucose-Capillary: 142 mg/dL — ABNORMAL HIGH (ref 70–99)

## 2021-03-13 NOTE — Progress Notes (Signed)
ATC setup changed 

## 2021-03-13 NOTE — Plan of Care (Signed)
°  Problem: Clinical Measurements: Goal: Will remain free from infection Outcome: Progressing Goal: Diagnostic test results will improve Outcome: Progressing Goal: Respiratory complications will improve Outcome: Progressing   Problem: Nutrition: Goal: Adequate nutrition will be maintained Outcome: Progressing   Problem: Elimination: Goal: Will not experience complications related to bowel motility Outcome: Progressing

## 2021-03-13 NOTE — Progress Notes (Signed)
Progress Note   Patient: Stephen Delgado HTD:[id number] DOB: [birth year] DOA: 12/30/2020     73 DOS: the patient was seen and examined on 03/13/2021      Brief hospital course: 56 y.o. male with PMH significant for DM2, HTN, CKD with questionable compliance to medications Patient presented to the ED on 12/30/2020 with complaint of headache, altered mental status   In the ED, he was hypertensive to 238/160, agitated, required restraints CT head showed large amount of intraventricular hemorrhage involving lateral/third/fourth ventricle, ICH and left basal ganglia as well as chronic microvascular ischemic changes. He was started on Cleviprex drip and admitted to neuro ICU   He subsequently had CT head repeated on 11/30, 12/1 and 12/3, all demonstrated unchanged intraparenchymal hemorrhage and intraventricular hemorrhage 12/3, MRI brain from also showed numerous small acute infarcts in both cerebral hemisphere with minimal improvement in the right superior cerebellum 12/3, intubated 12/13, tracheostomy 12/15, wean from vent to trach collar 12/16, PEG tube placement 12/16, repeat MRI showed an increase in the size and number of acute infarcts in the bilateral hemispheres with increased involvement in the cerebellum bilaterally.   12/18, transferred from neuro service to [facility]  12/21, palliative care consulted.  Family wanted to continue aggressive care as full CODE STATUS. 12/23 developed Cheyne-Stokes respiratory pattern with respiratory alkalosis.  Transferred to ICU for short-term mechanical ventilation and was found to have new tracheobronchitis now on cefepime 12/25 back to progressive floor and to [facility]   His hospital course has been complicated by significant neurological impairment, aspiration events, AKI, hypernatremia, urinary retention. 1/9 trach changed to #6.0 Shiley cuffless--1/10 > 30 days since trach inserted.  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  Discussed with pulmonary medicine and ID and recommendation was to not give antibiotics.    Diabetes mellitus type 2 in obese (HCC) -A1c 6.3 in 12/31/2020 -Continue Levemir to 24 units BID; continue SSI every 4 hours -CBGs well controlled  Malignant hypertension- (present on admission) Continue Coreg, hydralazine, and clonidine 2/8 increased p.m. Norvasc to 5 mg with improved BP control  Acute kidney injury on CKD 3b/acute azotemia with mild hypernatremia- (present on admission) -Baseline renal function: BUN 18 and cr 1.49 -Recent cr 1.18, BUN of 64 -Na 143-continue free water 250 cc q 4 hrs  Left scleral lesion -Injection resolved with NS drops  -Unsure significance of dark lesion -May benefit from eventual ophthalmological evaluation after dc -Injection resolved -Continue saline drops   Protein-calorie malnutrition/dysphagia due to recent stroke Nutrition Problem: Inadequate oral intake Etiology: lethargy/confusion, dysphagia Signs/Symptoms: NPO status Interventions: Tube feeding via PEG Body mass index is 23.33 kg/m.      Transaminitis -2/2 Zyprexa -Resolved  Pneumonia due to Klebsiella pneumoniae [facility]) Resolved  Acute urinary retention -Continue Urecholine -Foley catheter discontinued on 1/11 and patient voiding  easily  MSSA (methicillin susceptible Staphylococcus aureus) pneumonia (HCC) Resolved  Pressure injury of skin Resolved-prior anus ulcer  Pneumonia due to Serratia marcescens [facility]) Resolved        Subjective:  Unresponsive  Physical Exam: Vitals:   03/13/21 0722 03/13/21 0837 03/13/21 1105 03/13/21 1123  BP: (!) 152/80 133/84 115/70 115/70  Pulse: 89 90 81   Resp: 12 17 (!) 22 12  Temp: 98.8 F (37.1 C)  98.2 F (36.8 C)   TempSrc: Oral  Oral   SpO2: 98%  93%   Weight:      Height:       Constitutional: No acute distress, calm Respiratory: #6.0 Shiley cuffless trach, room air- lung sounds more coarse bilaterally,O2 sats between 97 and 99% -normal respiratory pattern Cardiovascular: S1-S2, NSR, normotensive -no bilateral lower extremity edema  Abdomen:  Abdomen soft and nontender.  PEG tube -normoactive bowels -LBM 2/10 Neurologic: Upper extremities are flaccid to PROM.-No further hypertonicity of lower extremities elicited with PROM.  Psychiatric: Opens eyes to tactile stimulation.  Does not track or make any efforts to communicate.  Nonverbal secondary to trach tube    Data Reviewed: There are no new results to review at this time.  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   A physical therapy consult is indicated based on the patients mobility assessment.   Mobility Assessment (last 72 hours)     Mobility Assessment     Row Name 03/13/21 0722 03/12/21 1500 03/12/21 0735   Does patient have an order for  bedrest or is patient medically unstable Yes- Bedfast (Level 1) - Complete -- Yes- Bedfast (Level 1) - Complete   What is the highest level of mobility based on the progressive mobility assessment? Level 1 (Bedfast) - Unable to balance while sitting on edge of bed Level 1 (Bedfast) - Unable to balance while sitting on edge of bed Level 1 (Bedfast) - Unable to balance while sitting on edge of bed   Is the above level different from baseline mobility prior to current illness? No - Consider discontinuing PT/OT -- No - Consider discontinuing PT/OT    Row Name 03/11/21 2040 03/10/21 2000     Does patient have an order for bedrest or is patient medically unstable Yes- Bedfast (Level 1) - Complete Yes- Bedfast (Level 1) - Complete    What is the highest level of mobility based on the progressive mobility assessment? Level 1 (Bedfast) - Unable to balance while sitting on edge of bed Level 1 (Bedfast) - Unable to balance while sitting on edge of bed    Is the above level different from baseline mobility prior to current illness? No - Consider discontinuing PT/OT No - Consider discontinuing PT/OT               COVID vaccination status: Unknown  Consultants: Neurology PCCM Medicine   Procedures: Echocardiogram EEG Cortrack PEG tube placement Tracheostomy tube placement   Planned Discharge Destination:  Skilled nursing facility     Time spent: 15 minutes  Author: Junious Silk, NP 03/13/2021 12:22 PM  For on call review [email or web address].

## 2021-03-13 NOTE — Progress Notes (Signed)
Nutrition Follow-up  DOCUMENTATION CODES:   Not applicable  INTERVENTION:  Continue TF via PEG: Glucerna 1.5 @ 76m/hr (14463mday) Free water per MD, currently 25087mree water Q4H   Tube feeding regimen provides 2160 kcal, 118 grams of protein, and 1092 ml of H2O (2592m68mtal free water).  -1 packet Juven BID, each packet provides 95 calories, 2.5 grams of protein (collagen), and 9.8 grams of carbohydrate (3 grams sugar); also contains 7 grams of L-arginine and L-glutamine, 300 mg vitamin C, 15 mg vitamin E, 1.2 mcg vitamin B-12, 9.5 mg zinc, 200 mg calcium, and 1.5 g  Calcium Beta-hydroxy-Beta-methylbutyrate to support wound healing  NUTRITION DIAGNOSIS:   Inadequate oral intake related to lethargy/confusion, dysphagia as evidenced by NPO status.  ongoing  GOAL:   Patient will meet greater than or equal to 90% of their needs  Met with TF  MONITOR:   Diet advancement, Labs, Weight trends  REASON FOR ASSESSMENT:   Ventilator, Consult Enteral/tube feeding initiation and management  ASSESSMENT:   55 y53r old male who presented to the ED on 11/29 with AMS. PMH of HTN, T2DM, CKD stage III. 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  11/30 cortrak placed; tip gastric 12/2 trickle TF 12/3 pt intubated due to increased WOB, +emesis, TF held 12/4 trickle TF restarted 12/5 advancement orders placed 12/9 TF adjusted 12/13 s/p tracheostomy 12/16 s/p EGD and PEG placement 12/19 tx out of ICU to [facility]/23 developed Cheyne-Stokes respiratory pattern with respiratory alkalosis.Transferred to ICU for short-term mechanical ventilation and was found to have new tracheobronchitis now on cefepime 12/25 tx back to [facility]9 trach changed to #6 shiley cuffless  1/10 marks 30 days since trach placed  Pt continues to be minimally responsive and tolerating ATC per CCM. Also continues to require TF via PEG and tolerating w/o issue.    Current TF: Glucerna 1.5 @ 60ml14mw/ 250ml 23m water Q4H  UOP: 2650ml x54mours I/O: +2823ml [id number]  Medications: SSI Q4H, 24 units levemir BID, imodium, nutrisource fiber BID, juven BID, protonix  Labs reviewed.  CBGs: [phone]:   Diet Order             Diet NPO time specified  Diet effective midnight                   EDUCATION NEEDS:   Not appropriate for education at this time  Skin:  Skin Assessment: Skin Integrity Issues: Skin Integrity Issues:: Stage II Stage II: anus  Last BM:  2/10  Height:   Ht Readings from Last 1 Encounters:  01/23/21 6' (1.829 m)    Weight:   Wt Readings from Last 1 Encounters:  03/13/21 83.9 kg   BMI:  Body mass index is 25.09 kg/m.  Estimated Nutritional Needs:   Kcal:  2150-2350  Protein:  105-120 grams  Fluid:  >2L     Belvin Gauss ATheone StanleyD, LDN (she/her/hers) RD pager number and weekend/on-call pager number located in [place]

## 2021-03-13 NOTE — Plan of Care (Signed)
?  Problem: Education: ?Goal: Knowledge of General Education information will improve ?Description: Including pain rating scale, medication(s)/side effects and non-pharmacologic comfort measures ?Outcome: Progressing ?  ?Problem: Health Behavior/Discharge Planning: ?Goal: Ability to manage health-related needs will improve ?Outcome: Progressing ?  ?Problem: Clinical Measurements: ?Goal: Ability to maintain clinical measurements within normal limits will improve ?Outcome: Progressing ?Goal: Will remain free from infection ?Outcome: Progressing ?Goal: Diagnostic test results will improve ?Outcome: Progressing ?Goal: Respiratory complications will improve ?Outcome: Progressing ?Goal: Cardiovascular complication will be avoided ?Outcome: Progressing ?  ?Problem: Activity: ?Goal: Risk for activity intolerance will decrease ?Outcome: Progressing ?  ?Problem: Nutrition: ?Goal: Adequate nutrition will be maintained ?Outcome: Progressing ?  ?Problem: Coping: ?Goal: Level of anxiety will decrease ?Outcome: Progressing ?  ?Problem: Elimination: ?Goal: Will not experience complications related to bowel motility ?Outcome: Progressing ?Goal: Will not experience complications related to urinary retention ?Outcome: Progressing ?  ?Problem: Pain Managment: ?Goal: General experience of comfort will improve ?Outcome: Progressing ?  ?Problem: Safety: ?Goal: Ability to remain free from injury will improve ?Outcome: Progressing ?  ?Problem: Skin Integrity: ?Goal: Risk for impaired skin integrity will decrease ?Outcome: Progressing ?  ?Problem: Education: ?Goal: Knowledge of disease or condition will improve ?Outcome: Progressing ?Goal: Knowledge of secondary prevention will improve (SELECT ALL) ?Outcome: Progressing ?Goal: Knowledge of patient specific risk factors will improve (INDIVIDUALIZE FOR PATIENT) ?Outcome: Progressing ?Goal: Individualized Educational Video(s) ?Outcome: Progressing ?  ?Problem: Coping: ?Goal: Will verbalize  positive feelings about self ?Outcome: Progressing ?Goal: Will identify appropriate support needs ?Outcome: Progressing ?  ?Problem: Health Behavior/Discharge Planning: ?Goal: Ability to manage health-related needs will improve ?Outcome: Progressing ?  ?Problem: Self-Care: ?Goal: Ability to participate in self-care as condition permits will improve ?Outcome: Progressing ?Goal: Verbalization of feelings and concerns over difficulty with self-care will improve ?Outcome: Progressing ?Goal: Ability to communicate needs accurately will improve ?Outcome: Progressing ?  ?Problem: Nutrition: ?Goal: Risk of aspiration will decrease ?Outcome: Progressing ?Goal: Dietary intake will improve ?Outcome: Progressing ?  ?Problem: Intracerebral Hemorrhage Tissue Perfusion: ?Goal: Complications of Intracerebral Hemorrhage will be minimized ?Outcome: Progressing ?  ?

## 2021-03-14 LAB — COMPREHENSIVE METABOLIC PANEL
ALT: 62 U/L — ABNORMAL HIGH (ref 0–44)
AST: 30 U/L (ref 15–41)
Albumin: 2.3 g/dL — ABNORMAL LOW (ref 3.5–5.0)
Alkaline Phosphatase: 96 U/L (ref 38–126)
Anion gap: 11 (ref 5–15)
BUN: 48 mg/dL — ABNORMAL HIGH (ref 6–20)
CO2: 23 mmol/L (ref 22–32)
Calcium: 9.6 mg/dL (ref 8.9–10.3)
Chloride: 103 mmol/L (ref 98–111)
Creatinine, Ser: 0.91 mg/dL (ref 0.61–1.24)
GFR, Estimated: >60 mL/min (ref >60)
Glucose, Bld: 158 mg/dL — ABNORMAL HIGH (ref 70–99)
Potassium: 4 mmol/L (ref 3.5–5.1)
Sodium: 137 mmol/L (ref 135–145)
Total Bilirubin: 0.2 mg/dL — ABNORMAL LOW (ref 0.3–1.2)
Total Protein: 6.4 g/dL — ABNORMAL LOW (ref 6.5–8.1)

## 2021-03-14 LAB — GLUCOSE, CAPILLARY
Glucose-Capillary: 127 mg/dL — ABNORMAL HIGH (ref 70–99)
Glucose-Capillary: 128 mg/dL — ABNORMAL HIGH (ref 70–99)
Glucose-Capillary: 143 mg/dL — ABNORMAL HIGH (ref 70–99)
Glucose-Capillary: 145 mg/dL — ABNORMAL HIGH (ref 70–99)
Glucose-Capillary: 145 mg/dL — ABNORMAL HIGH (ref 70–99)
Glucose-Capillary: 168 mg/dL — ABNORMAL HIGH (ref 70–99)

## 2021-03-14 NOTE — Plan of Care (Signed)
  Problem: Clinical Measurements: Goal: Respiratory complications will improve Outcome: Progressing   Problem: Nutrition: Goal: Adequate nutrition will be maintained Outcome: Progressing   Problem: Skin Integrity: Goal: Risk for impaired skin integrity will decrease Outcome: Progressing   

## 2021-03-14 NOTE — Progress Notes (Signed)
Consultation Progress Note   Patient: VONZELL LINDBLAD MWN:[id number] DOB: 12/02/1965 DOA: 12/30/2020 DOS: the patient was seen and examined on 03/14/2021 Primary service: Kathlen Mody, MD  Brief hospital course: 56 y.o. male with PMH significant for DM2, HTN, CKD with questionable compliance to medications Patient presented to the ED on 12/30/2020 with complaint of headache, altered mental status   In the ED, he was hypertensive to 238/160, agitated, required restraints CT head showed large amount of intraventricular hemorrhage involving lateral/third/fourth ventricle, ICH and left basal ganglia as well as chronic microvascular ischemic changes. He was started on Cleviprex drip and admitted to neuro ICU   He subsequently had CT head repeated on 11/30, 12/1 and 12/3, all demonstrated unchanged intraparenchymal hemorrhage and intraventricular hemorrhage 12/3, MRI brain from also showed numerous small acute infarcts in both cerebral hemisphere with minimal improvement in the right superior cerebellum 12/3, intubated 12/13, tracheostomy 12/15, wean from vent to trach collar 12/16, PEG tube placement 12/16, repeat MRI showed an increase in the size and number of acute infarcts in the bilateral hemispheres with increased involvement in the cerebellum bilaterally.   12/18, transferred from neuro service to [facility]  12/21, palliative care consulted.  Family wanted to continue aggressive care as full CODE STATUS. 12/23 developed Cheyne-Stokes respiratory pattern with respiratory alkalosis.  Transferred to ICU for short-term mechanical ventilation and was found to have new tracheobronchitis now on cefepime 12/25 back to progressive floor and to [facility]   His hospital course has been complicated by significant neurological impairment, aspiration events, AKI, hypernatremia, urinary retention. 1/9 trach changed to #6.0 Shiley cuffless--1/10 > 30 days since trach inserted.  Assessment and Plan: Left scleral  lesion -Injection resolved with NS drops  -Unsure significance of dark lesion -May benefit from eventual ophthalmological evaluation after dc -Injection resolved -Continue saline drops   Protein-calorie malnutrition/dysphagia due to recent stroke Nutrition Problem: Inadequate oral intake Etiology: lethargy/confusion, dysphagia Signs/Symptoms: NPO status Interventions: Tube feeding via PEG Body mass index is 23.33 kg/m.     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   -No real progress per OT  Transaminitis -2/2 Zyprexa -Resolved  Tracheobronchitis, chronic (HCC) -Has had 2 episodes since admission and has completed antibiotics -CT sinuses: rhinosinusitis worse on the left- wife ported patient has history of chronic sinus issues    Pneumonia due to Klebsiella pneumoniae (HCC) Resolved  Pressure injury of skin Resolved-prior anus ulcer  Acute urinary retention -Continue Urecholine -Foley catheter discontinued on 1/11 and patient voiding easily  Acute kidney injury on CKD 3b/acute azotemia with mild hypernatremia- (present on admission) -Baseline renal function: BUN 18 and cr 1.49 -Recent cr 1.18, BUN of 64 -Na 143-continue free water 250 cc q 4 hrs  MSSA (methicillin susceptible Staphylococcus aureus) pneumonia (HCC) Resolved  Pneumonia due to Serratia marcescens (HCC) Resolved  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  Discussed with pulmonary medicine and ID and recommendation was to not give antibiotics.    Diabetes mellitus type 2 in obese (HCC) -A1c 6.3 in 12/31/2020 -Continue Levemir to 24 units BID; continue SSI every 4 hours -CBGs well controlled  Malignant hypertension- (present on admission) Continue Coreg, hydralazine, and clonidine 2/8 increased p.m. Norvasc to 5 mg with improved BP control   No new changes in meds.    Subjective: alert and no distress noted.   Physical Exam: Vitals:   03/14/21 1136 03/14/21 1203 03/14/21 1505 03/14/21 1549  BP:  (!) 139/91 133/77 (!) 148/86  Pulse: 83 83  90  Resp: (!) 36 20  18  Temp:  99.1 F (37.3 C)  98.9 F (37.2 C)  TempSrc:  Axillary  Axillary  SpO2: 100% 100%  99%  Weight:      Height:         Data Reviewed:  Family Communication: none at bedside.    Time spent: 25 minutes.  Author: Kathlen Mody, MD 03/14/2021 4:24 PM  For on call review [email or web address].

## 2021-03-15 LAB — GLUCOSE, CAPILLARY
Glucose-Capillary: 136 mg/dL — ABNORMAL HIGH (ref 70–99)
Glucose-Capillary: 140 mg/dL — ABNORMAL HIGH (ref 70–99)
Glucose-Capillary: 145 mg/dL — ABNORMAL HIGH (ref 70–99)
Glucose-Capillary: 153 mg/dL — ABNORMAL HIGH (ref 70–99)
Glucose-Capillary: 154 mg/dL — ABNORMAL HIGH (ref 70–99)
Glucose-Capillary: 156 mg/dL — ABNORMAL HIGH (ref 70–99)
Glucose-Capillary: 165 mg/dL — ABNORMAL HIGH (ref 70–99)

## 2021-03-15 NOTE — Progress Notes (Signed)
Consultation Progress Note   Patient: Stephen Delgado [id number] DOB: December 29, 1965 DOA: 12/30/2020 DOS: the patient was seen and examined on 03/15/2021 Primary service: Hosie Poisson, MD  Brief hospital course: 56 y.o. male with PMH significant for DM2, HTN, CKD with questionable compliance to medications Patient presented to the ED on 12/30/2020 with complaint of headache, altered mental status   In the ED, he was hypertensive to 238/160, agitated, required restraints CT head showed large amount of intraventricular hemorrhage involving lateral/third/fourth ventricle, ICH and left basal ganglia as well as chronic microvascular ischemic changes. He was started on Cleviprex drip and admitted to neuro ICU   He subsequently had CT head repeated on 11/30, 12/1 and 12/3, all demonstrated unchanged intraparenchymal hemorrhage and intraventricular hemorrhage 12/3, MRI brain from also showed numerous small acute infarcts in both cerebral hemisphere with minimal improvement in the right superior cerebellum 12/3, intubated 12/13, tracheostomy 12/15, wean from vent to trach collar 12/16, PEG tube placement 12/16, repeat MRI showed an increase in the size and number of acute infarcts in the bilateral hemispheres with increased involvement in the cerebellum bilaterally.   12/18, transferred from neuro service to [facility]  12/21, palliative care consulted.  Family wanted to continue aggressive care as full CODE STATUS. 12/23 developed Cheyne-Stokes respiratory pattern with respiratory alkalosis.  Transferred to ICU for short-term mechanical ventilation and was found to have new tracheobronchitis now on cefepime 12/25 back to progressive floor and to [facility]   His hospital course has been complicated by significant neurological impairment, aspiration events, AKI, hypernatremia, urinary retention. 1/9 trach changed to #6.0 Shiley cuffless--1/10 > 30 days since trach inserted.  Assessment and Plan: Left scleral  lesion -Injection resolved with NS drops  -Unsure significance of dark lesion -May benefit from eventual ophthalmological evaluation after dc -Injection resolved -Continue saline drops   Protein-calorie malnutrition/dysphagia due to recent stroke Nutrition Problem: Inadequate oral intake Etiology: lethargy/confusion, dysphagia Signs/Symptoms: NPO status Interventions: Tube feeding via PEG at 38ml/hr.  Body mass index is 23.33 kg/m.     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   -No real progress per OT  Transaminitis -2/2 Zyprexa -Resolved  Tracheobronchitis, chronic (HCC) -Has had 2 episodes since admission and has completed antibiotics -CT sinuses: rhinosinusitis worse on the left- wife ported patient has history of chronic sinus issues    Pneumonia due to Klebsiella pneumoniae ([place]) Resolved. 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  Discussed with pulmonary medicine and ID and recommendation was to not give antibiotics.    Diabetes mellitus type 2 in obese (HCC) -A1c 6.3 in 12/31/2020 -Continue Levemir to 24 units BID; continue SSI every 4 hours -CBGs well controlled  CBG (last 3)  Recent Labs    03/15/21 0400 03/15/21 0823 03/15/21 1202  GLUCAP 136* 145* 154*   No changes in meds.   Malignant hypertension- (present on admission) Continue Coreg, hydralazine, and clonidine Well controlled.    No new changes in meds.    Subjective: appears comfortable, not in distress.  Physical Exam: Vitals:   03/15/21 0744 03/15/21 0817 03/15/21 0820 03/15/21 1139  BP:  (!) 151/92 (!) 151/92 133/89  Pulse: (!) 102 100  98  Resp: (!) 35 (!) 21  14  Temp:  99 F (37.2 C) 99 F (37.2 C) 98.9 F (37.2 C)  TempSrc:  Axillary  Axillary  SpO2: 99% 98%  99%  Weight:      Height:       General exam: ill appearing gentleman, not in distress.  Respiratory system: s/p trach collar, tachypnea earlier.  Cardiovascular system: S1 & S2 heard, RRR.  No pedal edema. Gastrointestinal system: Abdomen is nondistended, soft and nontender. Normal bowel sounds heard. Central nervous system: pt non verbal , appears comfortable.  Extremities: No pedal edema.  Skin: No rashes seen.  Psychiatry: unable to assessed   Data Reviewed:  Family Communication: none at bedside.    Time spent: 25 minutes.  Author: Hosie Poisson, MD 03/15/2021 3:19 PM  For on call review [email or web address].

## 2021-03-16 LAB — GLUCOSE, CAPILLARY
Glucose-Capillary: 151 mg/dL — ABNORMAL HIGH (ref 70–99)
Glucose-Capillary: 151 mg/dL — ABNORMAL HIGH (ref 70–99)
Glucose-Capillary: 160 mg/dL — ABNORMAL HIGH (ref 70–99)
Glucose-Capillary: 147 mg/dL — ABNORMAL HIGH (ref 70–99)
Glucose-Capillary: 130 mg/dL — ABNORMAL HIGH (ref 70–99)
Glucose-Capillary: 156 mg/dL — ABNORMAL HIGH (ref 70–99)

## 2021-03-16 NOTE — Progress Notes (Signed)
Consultation Progress Note   Patient: Stephen Delgado [id number] DOB: 1965-06-13 DOA: 12/30/2020 DOS: the patient was seen and examined on 03/16/2021 Primary service: Hosie Poisson, MD  Brief hospital course: 56 y.o. male with PMH significant for DM2, HTN, CKD with questionable compliance to medications Patient presented to the ED on 12/30/2020 with complaint of headache, altered mental status   In the ED, he was hypertensive to 238/160, agitated, required restraints CT head showed large amount of intraventricular hemorrhage involving lateral/third/fourth ventricle, ICH and left basal ganglia as well as chronic microvascular ischemic changes. He was started on Cleviprex drip and admitted to neuro ICU   He subsequently had CT head repeated on 11/30, 12/1 and 12/3, all demonstrated unchanged intraparenchymal hemorrhage and intraventricular hemorrhage 12/3, MRI brain from also showed numerous small acute infarcts in both cerebral hemisphere with minimal improvement in the right superior cerebellum 12/3, intubated 12/13, tracheostomy 12/15, wean from vent to trach collar 12/16, PEG tube placement 12/16, repeat MRI showed an increase in the size and number of acute infarcts in the bilateral hemispheres with increased involvement in the cerebellum bilaterally.   12/18, transferred from neuro service to [facility]  12/21, palliative care consulted.  Family wanted to continue aggressive care as full CODE STATUS. 12/23 developed Cheyne-Stokes respiratory pattern with respiratory alkalosis.  Transferred to ICU for short-term mechanical ventilation and was found to have new tracheobronchitis now on cefepime 12/25 back to progressive floor and to [facility]   His hospital course has been complicated by significant neurological impairment, aspiration events, AKI, hypernatremia, urinary retention. 1/9 trach changed to #6.0 Shiley cuffless--1/10 > 30 days since trach inserted. Pt seen and examined today. No new  complaints.   Assessment and Plan: Left scleral lesion -Injection resolved with NS drops  -Unsure significance of dark lesion -May benefit from eventual ophthalmological evaluation after dc -Injection resolved -Continue saline drops   Protein-calorie malnutrition/dysphagia due to recent stroke Nutrition Problem: Inadequate oral intake Etiology: lethargy/confusion, dysphagia Signs/Symptoms: NPO status Interventions: Tube feeding via PEG at 6ml/hr.  Body mass index is 23.33 kg/m.     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   -No progress in the last few days.   Transaminitis -2/2 Zyprexa -Resolved  Tracheobronchitis, chronic (HCC) -Has had 2 episodes since admission and has completed antibiotics. No fever or worsening secretions.  -CT sinuses: rhinosinusitis worse on the left- wife ported patient has history of chronic sinus issues    Pneumonia due to Klebsiella pneumoniae ([place]) Resolved. 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  Discussed with pulmonary medicine and ID and recommendation was to not give antibiotics.    Diabetes mellitus type 2 in obese (HCC) -  A1c 6.3 in 12/31/2020 -Continue Levemir to 24 units BID; continue SSI every 4 hours -CBGs well controlled CBG (last 3)  Recent Labs    03/16/21 0413 03/16/21 0804 03/16/21 1222  GLUCAP 151* 160* 147*   No change in meds.   Malignant hypertension- (present on admission) Continue Coreg, hydralazine, and clonidine Well controlled.    No new changes in meds.    Subjective: no distress noted.  Physical Exam: Vitals:   03/16/21 0714 03/16/21 0723 03/16/21 1114 03/16/21 1125  BP:   125/80   Pulse:  97 87 87  Resp:  17 20 20   Temp: 98.4 F (36.9 C)  98.1 F (36.7 C)   TempSrc: Axillary     SpO2: 100% 96%  98%  Weight:      Height:       General exam:ill appearing gentleman not in distress.  Respiratory system: Clear to auscultation. Respiratory effort normal.s/p trach.  Cardiovascular system: S1 & S2 heard, RRR. No JVD, No pedal edema. Gastrointestinal system: Abdomen is nondistended, soft and nontender.Normal bowel sounds heard. Central nervous system: Alert and not following commands.  Extremities: Symmetric 5 x 5 power. Skin: No rashes, lesions or ulcers Psychiatry: unable to assess    Data Reviewed:  Family Communication: none at bedside.    Time spent: 25 minutes.  Author: Hosie Poisson, MD 03/16/2021 12:42 PM  For on call review [email or web address].

## 2021-03-16 NOTE — Progress Notes (Signed)
Occupational Therapy Discharge Patient Details Name: Stephen Delgado MRN: [id number] DOB: 1965-09-04 Today's Date: 03/16/2021 Time:  -     Patient discharged from OT services secondary to  Pt not making functional progress with OT acutely. Cherylin Mylar completed family education session with pt's wife and son for maintenance PROM exercises, safe positioning and handling 2/9 . OT to sign off acutely.   Please see latest therapy progress note for current level of functioning and progress toward goals.    Progress and discharge plan discussed with patient and/or caregiver.       Andrzej Scully A Zara Wendt 03/16/2021, 1:24 PM

## 2021-03-16 NOTE — Plan of Care (Signed)
Family has been at bedside. Asked about therapy for the pt. Pt had a BM today. Turning pt frequently. Peg tube patent and airway cleared of secretions. Pt has been alert this morning. Oral care performed but pt kept his  mouth tight and closed.  Problem: Education: Goal: Knowledge of General Education information will improve Description: Including pain rating scale, medication(s)/side effects and non-pharmacologic comfort measures Outcome: Progressing   Problem: Health Behavior/Discharge Planning: Goal: Ability to manage health-related needs will improve Outcome: Progressing   Problem: Clinical Measurements: Goal: Ability to maintain clinical measurements within normal limits will improve Outcome: Progressing Goal: Will remain free from infection Outcome: Progressing Goal: Diagnostic test results will improve Outcome: Progressing Goal: Respiratory complications will improve Outcome: Progressing Goal: Cardiovascular complication will be avoided Outcome: Progressing   Problem: Activity: Goal: Risk for activity intolerance will decrease Outcome: Progressing   Problem: Nutrition: Goal: Adequate nutrition will be maintained Outcome: Progressing   Problem: Coping: Goal: Level of anxiety will decrease Outcome: Progressing   Problem: Elimination: Goal: Will not experience complications related to bowel motility Outcome: Progressing Goal: Will not experience complications related to urinary retention Outcome: Progressing   Problem: Pain Managment: Goal: General experience of comfort will improve Outcome: Progressing   Problem: Safety: Goal: Ability to remain free from injury will improve Outcome: Progressing   Problem: Skin Integrity: Goal: Risk for impaired skin integrity will decrease Outcome: Progressing   Problem: Education: Goal: Knowledge of disease or condition will improve Outcome: Progressing Goal: Knowledge of secondary prevention will improve (SELECT  ALL) Outcome: Progressing Goal: Knowledge of patient specific risk factors will improve (INDIVIDUALIZE FOR PATIENT) Outcome: Progressing Goal: Individualized Educational Video(s) Outcome: Progressing   Problem: Coping: Goal: Will verbalize positive feelings about self Outcome: Progressing Goal: Will identify appropriate support needs Outcome: Progressing   Problem: Health Behavior/Discharge Planning: Goal: Ability to manage health-related needs will improve Outcome: Progressing   Problem: Self-Care: Goal: Ability to participate in self-care as condition permits will improve Outcome: Progressing Goal: Verbalization of feelings and concerns over difficulty with self-care will improve Outcome: Progressing Goal: Ability to communicate needs accurately will improve Outcome: Progressing   Problem: Nutrition: Goal: Risk of aspiration will decrease Outcome: Progressing Goal: Dietary intake will improve Outcome: Progressing   Problem: Intracerebral Hemorrhage Tissue Perfusion: Goal: Complications of Intracerebral Hemorrhage will be minimized Outcome: Progressing

## 2021-03-16 NOTE — Progress Notes (Addendum)
° °  NAME:  Stephen Delgado, MRN:  [id number], DOB:  January 10, 1966, LOS: 79 ADMISSION DATE:  12/30/2020, CONSULTATION DATE:  12/19 REFERRING MD:  Erlinda Hong, CHIEF COMPLAINT:  Dyspnea   History of Present Illness:  56 yo male presented with headache, nausea and altered mental status.  CT head showed acute Lt ICH with IVH likely related to hypertensive emergency (BP 195/116). Had persistent hypertension and concern for airway protection, and PCCM asked to assist with ICU management.  Tracheostomy 12/13.  Moved out of ICU.   Pertinent  Medical History  HTN, DM type 2, CKD 3a, OSA  Significant Hospital Events: Including procedures, antibiotic start and stop dates in addition to other pertinent events   11/29 Admit, start cleviprex 12/01 PCCM consulted; changed from cleviprex to cardene due to elevated triglycerides 12/03 intubated; episode of vomiting >> tube feeds held 12/04 resume trickle tube feeds 12/06 remains minimally responsive. Tolerating SBT.  12/12 no acute events overnight, T-max 102 point 12/13 bedside trach planned midmorning  12/14 ATC. Lasix --> 5L UOP  12/15 cont on trach collar. Na to 154 from 145. Adding low rate d5  12/16 scheduled for PEG. Cont D5. Continues on trach collar Out of ICU 12/19 12/23 Cheyne-Stokes respirations noted and was transferred to ICU for short-term ventilation.  12/25 Returned to floor 1/9 Trach changed to 6 cuffless  2/6 Trach follow up, no major changes, remains on ATC   Imaging/Studies: CT head 11/29 >> large amount of IVH involving lateral/3rd/4th ventricles, ICH in Lt BG, chronic microvascular ischemic changes Echo 11/30 >> EF 50 to 55%, mod LVH, grade 1 DD, ascending aorta 37 mm EEG 12/01 >> continuous generalized slowing MRI brain 12/03 >> numerous small acute infarcts in b/l cerebral hemispheres, Lt caudate hemorrhage, IVH, scattered sulcal SAH, mild communicating hydrocephalus CT head 12/23 Expected evolution of strokes, no shift or  herniation    Interim History / Subjective:  No acute changes  Objective   Blood pressure 132/71, pulse 87, temperature 98.4 F (36.9 C), temperature source Axillary, resp. rate 20, height 6' (1.829 m), weight 83.5 kg, SpO2 98 %.    FiO2 (%):  [21 %] 21 %   Intake/Output Summary (Last 24 hours) at 03/16/2021 1135 Last data filed at 03/16/2021 0531 Gross per 24 hour  Intake 3399 ml  Output 2650 ml  Net 749 ml   Filed Weights   03/13/21 0353 03/15/21 0509 03/16/21 0500  Weight: 83.9 kg 83.9 kg 83.5 kg   Physical exam  General 56 year old male. Unresponsive HENT NCAT on JVD trach 6 cuffless. Unremarkable  Pulm clear not labored Card rrr Abd soft has PEG  Neuro eyes open. 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

## 2021-03-17 LAB — GLUCOSE, CAPILLARY
Glucose-Capillary: 138 mg/dL — ABNORMAL HIGH (ref 70–99)
Glucose-Capillary: 143 mg/dL — ABNORMAL HIGH (ref 70–99)
Glucose-Capillary: 145 mg/dL — ABNORMAL HIGH (ref 70–99)
Glucose-Capillary: 150 mg/dL — ABNORMAL HIGH (ref 70–99)
Glucose-Capillary: 162 mg/dL — ABNORMAL HIGH (ref 70–99)
Glucose-Capillary: 172 mg/dL — ABNORMAL HIGH (ref 70–99)

## 2021-03-17 MED ORDER — PREDNISOLONE ACETATE 1 % OP SUSP
1.0000 [drp] | Freq: Four times a day (QID) | OPHTHALMIC | Status: DC
  Administered 2021-03-17 – 2021-03-22 (×22): 1 [drp] via OPHTHALMIC
  Filled 2021-03-17: qty 5

## 2021-03-17 NOTE — TOC Transition Note (Signed)
Transition of Care [facility]) - CM/SW Discharge Note   Patient Details  Name: Stephen Delgado MRN: [id number] Date of Birth: 11/17/1965  [facility] CM/SW Contact:  Curlene Labrum, RN Phone Number: 03/17/2021, 11:22 AM   Clinical Narrative:    CM met with the patient's wife to discuss transitions of care needs.  The patient currently has no bed offers for [facility] placement.  [name] was made aware that she would be to spend-down available assets up to 13,000 dollars at recommendation of financial counseling until the patient is eligible to Medicaid benefits.  I spoke with the patient's wife and made her aware that the patient has no available bed offers for placement at this time - likely due to complex care needs including tracheostomy and payor source.  The patient will need LTC placement at recommendation of the wife since she is unable to care for the patient at home and provide 24-hour supervision and care.  No other family members are available at this time to assist in the home setting.  CM and MSW with DTP Team will continue to explore options for SNF placement.   Final next level of care: Skilled Nursing Facility Barriers to Discharge: Continued Medical Work up   Patient Goals and CMS Choice Patient states their goals for this hospitalization and ongoing recovery are:: Patient's family agreeable to SNF placement. CMS Medicare.gov Compare Post Acute Care list provided to:: Patient Represenative (must comment) (Patient's wife, [name]) Choice offered to / list presented to : Spouse  Discharge Placement                       Discharge Plan and Services   Discharge Planning Services: CM Consult Post Acute Care Choice: [place]                               Social Determinants of Health (SDOH) Interventions     Readmission Risk Interventions Readmission Risk Prevention Plan 02/20/2021  Transportation Screening  Complete  Medication Review (RN Care Manager) Complete  PCP or Specialist appointment within 3-5 days of discharge Not Complete  PCP/Specialist Appt Not Complete comments Patient will need LTC placement at [facility] facility.  [place] or Home Care Consult Complete  SW Recovery Care/Counseling Consult Complete  Palliative Care Screening Complete  Skilled Nursing Facility Complete  Some recent data might be hidden

## 2021-03-17 NOTE — Progress Notes (Addendum)
CSW spoke with Tim at [facility] in [place], [place] who states the facility cannot accept any additional trach patients at this time.  CSW spoke with Asher Muir at [facility] who states the facility cannot accept any additional trach patients at this time.  CSW and RN CM spoke with patient's wife [name] in unit conference room regarding discharge planning. The potential plan is for the patient to return home, with his wife after his cousin returns from a trip next week. Patient will require DME prior to discharge. [name] understands patient will require 24/7 care. All questions answered - DTP staff encouraged [name] to reach out for assistance as questions or needs arise.   [name], MSW, LCSW Transitions of Care   Clinical Social Worker II [phone]

## 2021-03-17 NOTE — Progress Notes (Signed)
Progress Note   Patient: Stephen Delgado [id number] DOB: 12/19/1965 DOA: 12/30/2020     77 DOS: the patient was seen and examined on 03/17/2021      Brief hospital course: 56 y.o. male with PMH significant for DM2, HTN, CKD with questionable compliance to medications Patient presented to the ED on 12/30/2020 with complaint of headache, altered mental status   In the ED, he was hypertensive to 238/160, agitated, required restraints CT head showed large amount of intraventricular hemorrhage involving lateral/third/fourth ventricle, ICH and left basal ganglia as well as chronic microvascular ischemic changes. He was started on Cleviprex drip and admitted to neuro ICU   He subsequently had CT head repeated on 11/30, 12/1 and 12/3, all demonstrated unchanged intraparenchymal hemorrhage and intraventricular hemorrhage 12/3, MRI brain from also showed numerous small acute infarcts in both cerebral hemisphere with minimal improvement in the right superior cerebellum 12/3, intubated 12/13, tracheostomy 12/15, wean from vent to trach collar 12/16, PEG tube placement 12/16, repeat MRI showed an increase in the size and number of acute infarcts in the bilateral hemispheres with increased involvement in the cerebellum bilaterally.   12/18, transferred from neuro service to [facility]  12/21, palliative care consulted.  Family wanted to continue aggressive care as full CODE STATUS. 12/23 developed Cheyne-Stokes respiratory pattern with respiratory alkalosis.  Transferred to ICU for short-term mechanical ventilation and was found to have new tracheobronchitis now on cefepime 12/25 back to progressive floor and to [facility]   His hospital course has been complicated by significant neurological impairment, aspiration events, AKI, hypernatremia, urinary retention. 1/9 trach changed to #6.0 Shiley cuffless--1/10 > 30 days since trach inserted.  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  Discussed with pulmonary medicine and ID and recommendation was to not give antibiotics.    Diabetes mellitus type 2 in obese (HCC) -A1c 6.3 in 12/31/2020 -Continue Levemir to 24 units BID; continue SSI every 4 hours -CBGs well controlled  Malignant hypertension- (present on admission) Continue Coreg, hydralazine, and clonidine 2/8 increased p.m. Norvasc to 5 mg with improved BP control  Acute kidney injury on CKD 3b/acute azotemia with mild hypernatremia- (present on admission) -Baseline renal function: BUN 18 and cr 1.49 -Recent cr 1.18, BUN of 64 -Na 143-continue free water 250 cc q 4 hrs  Left scleral lesion/scleral hemorrhage -Injection resolved with NS drops   -Wife states dark pigmented areas in eyes have been there for multiple years without change -2/14 new medial scleral hemorrhage unclear from dry eye alone versus from unintended trauma -Begin prednisone drops 4 times daily along with saline drops every 6 hours.  Once hemorrhage has resolved can decrease prednisone drops to daily-would like to transition to [place] this  medication is not available inpatient and typical OOP cost is between $600 and $800  Protein-calorie malnutrition/dysphagia due to recent stroke Nutrition Problem: Inadequate oral intake Etiology: lethargy/confusion, dysphagia Signs/Symptoms: NPO status Interventions: Tube feeding via PEG Body mass index is 23.33 kg/m.     Transaminitis -2/2 Zyprexa -Resolved  Pneumonia due to Klebsiella pneumoniae [facility]) Resolved  Acute urinary retention -Continue Urecholine -Foley catheter discontinued on 1/11 and patient voiding easily  MSSA (methicillin susceptible Staphylococcus aureus) pneumonia (HCC) Resolved  Pressure injury of skin Resolved-prior anus ulcer  Pneumonia due to Serratia marcescens ([place]) Resolved        Subjective:  Unresponsive.  Wife at bedside  Physical Exam: Vitals:   03/17/21 0349 03/17/21 0830 03/17/21 0919 03/17/21 0920  BP: (!) 141/96 (!) 156/82    Pulse: 94 (!) 101 (!) 101   Resp: 16 18 (!) 31 19  Temp: 98.8 F (37.1 C) 97.9 F (36.6 C)    TempSrc: Axillary Axillary    SpO2: 100% 100% 100%   Weight:      Height:       Constitutional: No acute distress, calm ENT: On 2/14 patient with new medial canthus scleral hemorrhage-no signs of infection Respiratory: #6.0 Shiley cuffless trach, room air- lung sounds more coarse bilaterally,O2 sats between 100% -normal respiratory pattern Cardiovascular: S1-S2, NSR, normotensive -no bilateral lower extremity edema  Abdomen:  Abdomen soft and nontender.  PEG tube -normoactive bowels -LBM 2/13 Neurologic: Upper extremities are flaccid to PROM.-No further hypertonicity of lower extremities elicited with PROM.  Psychiatric: Opens eyes to tactile stimulation.  Does not track or make any efforts to communicate.  Nonverbal secondary to trach tube    Data Reviewed: There are no new results to review at this time.  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   A physical therapy consult is indicated based on the patients mobility assessment.   Mobility Assessment (last 72 hours)     Mobility Assessment     Row Name 03/14/21 2000       Does patient have an order for bedrest or is patient medically unstable Yes- Bedfast (Level 1) - Complete     What is the highest level of mobility based on the progressive mobility assessment? Level 1 (Bedfast) - Unable to balance while sitting on edge of bed                COVID vaccination status: Unknown  Consultants: Neurology PCCM Medicine   Procedures: Echocardiogram EEG Cortrack PEG tube placement Tracheostomy tube placement   Planned Discharge Destination:  Skilled nursing facility     Time spent: 15 minutes  Author: Erin Hearing, NP 03/17/2021 10:16 AM  For on call review [email or web address].

## 2021-03-17 NOTE — Plan of Care (Signed)
  Problem: Nutrition: Goal: Adequate nutrition will be maintained Outcome: Progressing   Problem: Elimination: Goal: Will not experience complications related to bowel motility Outcome: Progressing   Problem: Safety: Goal: Ability to remain free from injury will improve Outcome: Progressing   

## 2021-03-18 LAB — GLUCOSE, CAPILLARY
Glucose-Capillary: 118 mg/dL — ABNORMAL HIGH (ref 70–99)
Glucose-Capillary: 134 mg/dL — ABNORMAL HIGH (ref 70–99)
Glucose-Capillary: 138 mg/dL — ABNORMAL HIGH (ref 70–99)
Glucose-Capillary: 149 mg/dL — ABNORMAL HIGH (ref 70–99)
Glucose-Capillary: 152 mg/dL — ABNORMAL HIGH (ref 70–99)
Glucose-Capillary: 173 mg/dL — ABNORMAL HIGH (ref 70–99)

## 2021-03-18 NOTE — Progress Notes (Signed)
CSW made referral for outpatient palliative services to [place] [place].  [name], MSW, LCSW Transitions of Care   Clinical Social Worker II [phone]

## 2021-03-18 NOTE — Progress Notes (Cosign Needed)
°  °  Durable Medical Equipment  (From admission, onward)           Start     Ordered   03/18/21 0801  For home use only DME Tube feeding  Once        03/18/21 0800   03/18/21 0758  For home use only DME Trach supplies  (For Home Use Only DME Trach Supplies)  Once       Question Answer Comment  Trach Type Shiley   Cuffed or Uncuffed Uncuffed   Fenestrated No   Size 6 mm   XLT Length No   Back Up Trach Type Shiley   Cuffed or Uncuffed Uncuffed   Fenestrated No   Size 6 mm   XLT Length No   Trach Supplies/Equipment for Home Use Tube Holder/Collar   Trach Supplies/Equipment for Home Use Tracheostomy Drain Dressings   Trach Supplies/Equipment for Home Use Medical Suction Machine   Trach Supplies/Equipment for Home Use Suction Catheters   Trach Supplies/Equipment for Home Use Tracheostomy Care Cleaning Kits   Trach Supplies/Equipment for Home Use HME Device   Trach Supplies/Equipment for Home Use Humidification (oxygen 5 liters via trach collar to provide specified % - if tracheostomy is capped with occlusive cap during day, a separate [place] order will need to be provided)   Suction Catheter Size 14 French (for size 6 or higher)   Cleaning Kits 60   Oxygen % Other (see comment) 5L/min at 28% - humidified     03/18/21 0800   03/18/21 0755  For home use only DME Hospital bed  Once       Question Answer Comment  Length of Need Lifetime   The above medical condition requires: Patient requires the ability to reposition frequently   Head must be elevated greater than: 30 degrees   Bed type Semi-electric   Hoyer Lift Yes   Support Surface: Low Air loss Mattress      03/18/21 0800

## 2021-03-18 NOTE — TOC Progression Note (Signed)
Transition of Care [facility]) - Progression Note    Patient Details  Name: Stephen Delgado MRN: [id number] Date of Birth: 23-Sep-1965  Transition of Care [facility]) CM/SW [place], RN Phone Number: 03/18/2021, 7:35 AM  Clinical Narrative:    CM and Madilyn Fireman, MSW with DTP Team met with the patient's wife yesterday to discuss discharging planning to home with family / home health support and the patient's wife, [name] was agreeable.  An extensive search for SNF placement has been made during the hospital admission, but unable to find SNF appropriate bed for the patient at this time - likely due to patient's complex care needs including tracheostomy.  [name] is aware that she would need to spend down 13,000 dollars before the patient is able to qualify for Medicaid eligibility.  [name] states that her brother is visiting next week and plans to have the patient return home next week when she has reliable support in the home.    DME support was offered to the patient's wife and she does not have a preference and CM will reach out to [employer] on 03/18/2021 to coordinate dme supplies including hospital bed, hoyer lift, tube feeding formula and equipment for bolus feeds, suction equipment, humidification for trach if ordered, and trach supplies.  CM will communicate with Christella Scheuermann today to set up home health support and palliative care through [facility] as requested by the patient's wife.  Bedside nursing/ RT to provide teaching for patient's family prior to discharge to the home.  The nursing unit director will be notified so that nursing care and time can be allocated to teach the family regarding airway management, PEG tube feeds - likely bolus, medication administration and skin care needs for home.  CM and MSW will continue to follow the patient for discharge planning for home next week.   Expected Discharge Plan: [place] Barriers to  Discharge: Continued Medical Work up  Expected Discharge Plan and Services Expected Discharge Plan: [place] In-house Referral: Clinical Social Work, Hospice / [place], PCP / Health Connect Discharge Planning Services: CM Consult Post Acute Care Choice: [place] arrangements for the past 2 months: [place]                 DME Arranged: Hospital bed, Trach supplies, Tube feeding, Oxygen DME Agency: [employer] Date DME Agency Contacted: 03/18/21 Time DME Agency Contacted: 1000 Representative spoke with at DME Agency: Bethanne Ginger, CM with [employer]             Social Determinants of Health ([place]) Interventions    Readmission Risk Interventions Readmission Risk Prevention Plan 02/20/2021  Transportation Screening Complete  Medication Review ([place]) Complete  PCP or Specialist appointment within 3-5 days of discharge Not Complete  PCP/Specialist Appt Not Complete comments Patient will need LTC placement at SNF facility.  [place] or Home Care Consult Complete  SW Recovery Care/Counseling Consult Complete  Palliative Care Screening Complete  Skilled Nursing Facility Complete  Some recent data might be hidden

## 2021-03-18 NOTE — Progress Notes (Signed)
[facility] ([facility]) Hospital Liaison Note  Notified by TOC manager of patient/family request for [facility] palliative services at home after discharge.   [facility] hospital liaison will follow patient for discharge disposition.   Please call with any hospice or outpatient palliative care related questions.   Thank you for the opportunity to participate in this patient's care.   Shanita Wicker, LCSW [facility] Hospital Liaison [phone]  

## 2021-03-18 NOTE — Progress Notes (Signed)
Progress Note   Patient: Stephen Delgado [id number] DOB: [birth year] DOA: 12/30/2020     78 DOS: the patient was seen and examined on 03/18/2021      Brief hospital course: 56 y.o. male with PMH significant for DM2, HTN, CKD with questionable compliance to medications Patient presented to the ED on 12/30/2020 with complaint of headache, altered mental status   In the ED, he was hypertensive to 238/160, agitated, required restraints CT head showed large amount of intraventricular hemorrhage involving lateral/third/fourth ventricle, ICH and left basal ganglia as well as chronic microvascular ischemic changes. He was started on Cleviprex drip and admitted to neuro ICU   He subsequently had CT head repeated on 11/30, 12/1 and 12/3, all demonstrated unchanged intraparenchymal hemorrhage and intraventricular hemorrhage 12/3, MRI brain from also showed numerous small acute infarcts in both cerebral hemisphere with minimal improvement in the right superior cerebellum 12/3, intubated 12/13, tracheostomy 12/15, wean from vent to trach collar 12/16, PEG tube placement 12/16, repeat MRI showed an increase in the size and number of acute infarcts in the bilateral hemispheres with increased involvement in the cerebellum bilaterally.   12/18, transferred from neuro service to [facility]  12/21, palliative care consulted.  Family wanted to continue aggressive care as full CODE STATUS. 12/23 developed Cheyne-Stokes respiratory pattern with respiratory alkalosis.  Transferred to ICU for short-term mechanical ventilation and was found to have new tracheobronchitis now on cefepime 12/25 back to progressive floor and to [facility]   His hospital course has been complicated by significant neurological impairment, aspiration events, AKI, hypernatremia, urinary retention. 1/9 trach changed to #6.0 Shiley cuffless--1/10 > 30 days since trach inserted.  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  Discussed with pulmonary medicine and ID and recommendation was to not give antibiotics.    Diabetes mellitus type 2 in obese (HCC) -A1c 6.3 in 12/31/2020 -Continue Levemir to 24 units BID; continue SSI every 4 hours -CBGs well controlled  Malignant hypertension- (present on admission) Continue Coreg, hydralazine, and clonidine 2/8 increased p.m. Norvasc to 5 mg with improved BP control  Acute kidney injury on CKD 3b/acute azotemia with mild hypernatremia- (present on admission) -Baseline renal function: BUN 18 and cr 1.49 -Recent cr 1.18, BUN of 64 -Na 143-continue free water 250 cc q 4 hrs  Left scleral lesion/scleral hemorrhage -Injection resolved with NS drops   -Wife states dark pigmented areas in eyes have been there for multiple years without change -2/14 new medial scleral hemorrhage unclear from dry eye alone versus from unintended trauma -Begin prednisone drops 4 times daily along with saline drops every 6 hours.  Once hemorrhage has resolved can decrease prednisone drops to daily-would like to transition to [place] this  medication is not available inpatient and typical OOP cost is between $600 and $800  Protein-calorie malnutrition/dysphagia due to recent stroke Nutrition Problem: Inadequate oral intake Etiology: lethargy/confusion, dysphagia Signs/Symptoms: NPO status Interventions: Tube feeding via PEG Body mass index is 23.33 kg/m.     Transaminitis -2/2 Zyprexa -Resolved  Pneumonia due to Klebsiella pneumoniae [facility]) Resolved  Acute urinary retention -Continue Urecholine -Foley catheter discontinued on 1/11 and patient voiding easily  MSSA (methicillin susceptible Staphylococcus aureus) pneumonia (HCC) Resolved  Pressure injury of skin Resolved-prior anus ulcer  Pneumonia due to Serratia marcescens [facility]) Resolved        Subjective:  Eyes are open but otherwise unresponsive  Physical Exam: Vitals:   03/17/21 2351 03/18/21 0315 03/18/21 0418 03/18/21 0720  BP: (!) 145/84  (!) 134/93 139/88  Pulse: 100 94 97 (!) 103  Resp: (!) 25 12 13 10   Temp: 98.9 F (37.2 C)  98.6 F (37 C) 97.7 F (36.5 C)  TempSrc: Axillary  Axillary Oral  SpO2: 100% 100% 100% 100%  Weight:      Height:       Constitutional: No acute distress, calm ENT: Stable medial canthus scleral hemorrhage-no signs of infection Respiratory: #6.0 Shiley cuffless trach, room air- lung sounds more coarse bilaterally,O2 sats between 100% -normal respiratory pattern Cardiovascular: S1-S2, NSR, normotensive -no bilateral lower extremity edema  Abdomen:  Abdomen soft and nontender.  PEG tube -normoactive bowels -LBM 2/13 Neurologic: Upper extremities are flaccid to PROM.-No further hypertonicity of lower extremities elicited with PROM.  Psychiatric: Eyes are open.  Does not make eye contact or track    Data Reviewed: There are no new results to review at this time.  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   A physical therapy consult is indicated based on the patients mobility assessment.   Mobility Assessment (last 72 hours)     Mobility Assessment     Row Name 03/17/21 0803       Does patient have an order for bedrest or is patient medically unstable Yes- Bedfast (Level 1) - Complete     What is the highest level of mobility based on the progressive mobility assessment? Level 1 (Bedfast) - Unable to balance while sitting on edge of bed     Is the above level different from baseline mobility prior to current illness? Yes - Recommend PT order              COVID vaccination status:  Unknown  Consultants: Neurology PCCM Medicine   Procedures: Echocardiogram EEG Cortrack PEG tube placement Tracheostomy tube placement   Planned Discharge Destination:  Home with Home Health    Time spent: 15 minutes  Author: Erin Hearing, NP 03/18/2021 7:48 AM  For on call review [email or web address].

## 2021-03-18 NOTE — TOC Progression Note (Addendum)
Transition of Care [facility]) - Progression Note    Patient Details  Name: Stephen Delgado MRN: [id number] Date of Birth: [birth year]  Transition of Care [facility]) CM/SW Contact  [name], RN Phone Number: 03/18/2021, 12:24 PM  Clinical Narrative:    CM called and left a voicemail message with Dartha Lodge, [facility] [phone], fax # [phone] with [employer] managed Insurance to explore options for coverage for home health including home health providers in network, private nursing availability through [facility], dme needs for home and ambulance transportation needs once the patient is able to discharge to home next week once family teaching has been completed.    I spoke with the patient's wife, Stephen Delgado, and explained that she would need to meet with bedside nursing and RT to receive patient care teaching relating to medication administration, PEG tube care and management, skincare and trach care management and suctioning needs.  Bedside nursing, respiratory therapy and bedside leadership is aware of teach needs for the family and Rennis Harding, NP placed a nursing communication order in Epic as well to request required teaching before the patient can be safely discharged to home.  I called and spoke with Jenness Corner, CM with [employer] and requested dme needs including hospital bed, hoyer lift, tube feeding with bolus feeds,  trach supplies and suction.  I called and spoke with Janey Greaser, CM with [employer] and requested private duty nursing at the home if [employer] is able to cover for costs.  Clinicals were faxed to [place], CM at [phone].  CM and MSW with DTP Team will continue to follow the patient for [facility] needs for home with home health support and dme.  03/18/2021 1430 - CM called and spoke with Dartha Lodge, RNCM [phone], x [phone], fax # [phone]-  - with [facility] provider and the patient's insurance plan does not cover and provide for  private duty nursing care through [facility] care.  [place], RNCM provided me with the case management department to contact through [facility] and a request for case management for home health services for the patient was started Elsie Amis Cor - [phone] -- Case # [id number], fax # [phone] and clinicals and home health orders were faxed so that services could be set up at the home including Home Health RN, aide.  DME was ordered through [employer] - follow up will need to be made so that DME is delivered to the home prior to discharge once a date has been determined with the family and family teaching has been completed for trach care and PEG feedings by bedside nursing.     Expected Discharge Plan: Home w Home Health Services Barriers to Discharge: Continued Medical Work up  Expected Discharge Plan and Services Expected Discharge Plan: Home w Home Health Services In-house Referral: Clinical Social Work, Hospice / Palliative Care, PCP / Health Connect Discharge Planning Services: CM Consult Post Acute Care Choice: Home Health Living arrangements for the past 2 months: Single Family Home                 DME Arranged: Hospital bed, Trach supplies, Tube feeding, Oxygen DME Agency: [employer] Date DME Agency Contacted: 03/18/21 Time DME Agency Contacted: 1000 Representative spoke with at DME Agency: Jenness Corner, CM with [employer]             Social Determinants of Health (SDOH) Interventions    Readmission Risk Interventions Readmission Risk Prevention Plan 02/20/2021  Transportation  Screening Complete  Medication Review (RN Care Manager) Complete  PCP or Specialist appointment within 3-5 days of discharge Not Complete  PCP/Specialist Appt Not Complete comments Patient will need LTC placement at SNF facility.  [place]RI or Home Care Consult Complete  SW Recovery Care/Counseling Consult Complete  Palliative Care Screening Complete  Skilled Nursing Facility Complete  Some recent  data might be hidden

## 2021-03-19 LAB — GLUCOSE, CAPILLARY
Glucose-Capillary: 124 mg/dL — ABNORMAL HIGH (ref 70–99)
Glucose-Capillary: 125 mg/dL — ABNORMAL HIGH (ref 70–99)
Glucose-Capillary: 149 mg/dL — ABNORMAL HIGH (ref 70–99)
Glucose-Capillary: 155 mg/dL — ABNORMAL HIGH (ref 70–99)
Glucose-Capillary: 176 mg/dL — ABNORMAL HIGH (ref 70–99)
Glucose-Capillary: 194 mg/dL — ABNORMAL HIGH (ref 70–99)

## 2021-03-19 MED ORDER — GLUCERNA 1.5 CAL PO LIQD
474.0000 mL | Freq: Three times a day (TID) | ORAL | Status: DC
  Administered 2021-03-19 – 2021-03-20 (×2): 474 mL
  Filled 2021-03-19 (×3): qty 474

## 2021-03-19 MED ORDER — FREE WATER
200.0000 mL | Freq: Four times a day (QID) | Status: DC
  Administered 2021-03-19 – 2021-03-22 (×14): 200 mL

## 2021-03-19 NOTE — TOC Progression Note (Addendum)
Transition of Care [facility]) - Progression Note    Patient Details  Name: Stephen Delgado MRN: [id number] Date of Birth: Jan 28, 1966  Transition [facility] CM/SW Contact  [name], RN Phone Number: 03/19/2021, 9:04 AM  Clinical Narrative:    CM called and spoke with [employer] Case management this morning at [phone] option 7, [phone] (Case # [id number]) and discussed discharge planning for home.  [employer] case management has sent the patient's clinicals to [facility] health services to review the patient's clinicals for care - home health services are currently being reviewed and have not been accepted at this time but are pending under review.  Junious Silk, NP called and states that the patient's wife is concerned discharge to home stating that she is unable to stop working and pay for her bills at the home.  I will speak with her in person again today to discuss options for discharge.  No SNF offers for placement are available at this time.  CM and MSW with DTP Team will continue to follow the patient for discharge planning.  03/19/2021 1139 - CM spoke with the patient's wife regarding TOC to home with family support and home health through [employer] provider.  I explained to the patient's wife that SNF bed offers are unavailable to patient's admission at this time.  [name] is aware that she will need to spend down 13,000 dollars before the patient is eligible for Medicaid eligibility and/or approval according to financial counseling.  [name] states that she is stressed and has been unable to sleep for 2 days and would like time to make arrangements for home.  [name] states that her brother-in-law and other family will meet with [facility] staff next week to discuss the matter.  The wife states that she spoke with a NP from [place] last evening on the phone.  The patient' wife was updated the the home equipment would be provided  through [employer] and is in network with her insurance provider.  I encouraged the patient's wife to speak with her supervisor at her work to discuss FMLA from her position to return home and care for her husband at home since she is unable to offer LTC placement for her husband at this time.  TOC staff will continue to have discharge to home conversations with the patient's wife and family.   Expected Discharge Plan: Home w Home Health Services Barriers to Discharge: Continued Medical Work up  Expected Discharge Plan and Services Expected Discharge Plan: Home w Home Health Services In-house Referral: Clinical Social Work, Hospice / Palliative Care, PCP / Health Connect Discharge Planning Services: CM Consult Post Acute Care Choice: Home Health Living arrangements for the past 2 months: Single Family Home                 DME Arranged: Hospital bed, Trach supplies, Tube feeding, Oxygen DME Agency: [employer] Date DME Agency Contacted: 03/18/21 Time DME Agency Contacted: 1000 Representative spoke with at DME Agency: Jenness Corner, CM with [employer]             Social Determinants of Health (SDOH) Interventions    Readmission Risk Interventions Readmission Risk Prevention Plan 02/20/2021  Transportation Screening Complete  Medication Review (RN Care Manager) Complete  PCP or Specialist appointment within 3-5 days of discharge Not Complete  PCP/Specialist Appt Not Complete comments Patient will need LTC placement at SNF facility.  HRI or Home Care Consult Complete  SW Recovery Care/Counseling Consult Complete  Palliative Care Screening Complete  Skilled Nursing Facility Complete  Some recent data might be hidden

## 2021-03-19 NOTE — Progress Notes (Signed)
Progress Note   Patient: Stephen Delgado [id number] DOB: 09/01/1965 DOA: 12/30/2020     79 DOS: the patient was seen and examined on 03/19/2021      Brief hospital course: 56 y.o. male with PMH significant for DM2, HTN, CKD with questionable compliance to medications Patient presented to the ED on 12/30/2020 with complaint of headache, altered mental status   In the ED, he was hypertensive to 238/160, agitated, required restraints CT head showed large amount of intraventricular hemorrhage involving lateral/third/fourth ventricle, ICH and left basal ganglia as well as chronic microvascular ischemic changes. He was started on Cleviprex drip and admitted to neuro ICU   He subsequently had CT head repeated on 11/30, 12/1 and 12/3, all demonstrated unchanged intraparenchymal hemorrhage and intraventricular hemorrhage 12/3, MRI brain from also showed numerous small acute infarcts in both cerebral hemisphere with minimal improvement in the right superior cerebellum 12/3, intubated 12/13, tracheostomy 12/15, wean from vent to trach collar 12/16, PEG tube placement 12/16, repeat MRI showed an increase in the size and number of acute infarcts in the bilateral hemispheres with increased involvement in the cerebellum bilaterally.   12/18, transferred from neuro service to [facility]  12/21, palliative care consulted.  Family wanted to continue aggressive care as full CODE STATUS. 12/23 developed Cheyne-Stokes respiratory pattern with respiratory alkalosis.  Transferred to ICU for short-term mechanical ventilation and was found to have new tracheobronchitis now on cefepime 12/25 back to progressive floor and to [facility]   His hospital course has been complicated by significant neurological impairment, aspiration events, AKI, hypernatremia, urinary retention. 1/9 trach changed to #6.0 Shiley cuffless--1/10 > 30 days since trach inserted.  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  Discussed with pulmonary medicine and ID and recommendation was to not give antibiotics.    Diabetes mellitus type 2 in obese (HCC) -A1c 6.3 in 12/31/2020 -Continue Levemir to 24 units BID; continue SSI every 4 hours -CBGs well controlled  Malignant hypertension- (present on admission) Continue Coreg, hydralazine, and clonidine 2/8 increased p.m. Norvasc to 5 mg with improved BP control  Acute kidney injury on CKD 3b/acute azotemia with mild hypernatremia- (present on admission) -Baseline renal function: BUN 18 and cr 1.49 -Recent cr 1.18, BUN of 64 -Na 143-continue free water 250 cc q 4 hrs  Left scleral lesion/scleral hemorrhage -Injection resolved with NS drops   -Wife states dark pigmented areas in eyes have been there for multiple years without change -2/14 new medial scleral hemorrhage unclear from dry eye alone versus from unintended trauma -Begin prednisone drops 4 times daily along with saline drops every 6 hours.  Once hemorrhage has resolved can decrease prednisone drops to daily-would like to transition to [place] this  medication is not available inpatient and typical OOP cost is between $600 and $800  Protein-calorie malnutrition/dysphagia due to recent stroke Nutrition Problem: Inadequate oral intake Etiology: lethargy/confusion, dysphagia Signs/Symptoms: NPO status Interventions: Tube feeding via PEG Body mass index is 23.33 kg/m.     Transaminitis -2/2 Zyprexa -Resolved  Pneumonia due to Klebsiella pneumoniae [facility]) Resolved  Acute urinary retention -Continue Urecholine -Foley catheter discontinued on 1/11 and patient voiding easily  MSSA (methicillin susceptible Staphylococcus aureus) pneumonia (HCC) Resolved  Pressure injury of skin Resolved-prior anus ulcer  Pneumonia due to Serratia marcescens ([place]) Resolved        Subjective:  Remains unresponsive  Physical Exam: Vitals:   03/18/21 1938 03/18/21 1949 03/18/21 2335 03/19/21 0422  BP:   102/66 102/66  Pulse:  91 91 94  Resp:  20 19 (!) 22  Temp: 98.6 F (37 C) 99.3 F (37.4 C) 98.9 F (37.2 C)   TempSrc: Axillary Axillary Axillary   SpO2:  100% 100% 100%  Weight:      Height:       Constitutional: No acute distress, calm and unresponsive ENT: Stable medial canthus scleral hemorrhage-no signs of infection Respiratory: #6.0 Shiley cuffless trach, room air- lung sounds more coarse bilaterally,O2 sats between 100% -normal respiratory pattern without Cheyne-Stokes pattern today Cardiovascular: S1-S2, NSR, normotensive -no bilateral lower extremity edema  Abdomen:  Abdomen soft and nontender.  PEG tube -normoactive bowels -LBM 2/15 Neurologic: Upper extremities are flaccid to PROM.-No further hypertonicity of lower extremities elicited with PROM.  Psychiatric: Eyes are open.  Does not make eye contact or track    Data Reviewed: There are no new results to review at this time.  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   A physical therapy consult is indicated based on the patients mobility assessment.   Mobility Assessment (last 72 hours)     Mobility Assessment     Row Name 03/18/21 0800 03/17/21 0803     Does patient have an order for bedrest or is patient medically unstable Yes- Bedfast (Level 1) - Complete Yes- Bedfast (Level 1) - Complete    What is the highest level of mobility based on the progressive mobility assessment? Level 1 (Bedfast) - Unable to balance while sitting on edge of bed Level 1 (Bedfast) - Unable to balance while sitting on edge of bed    Is the above level different from baseline mobility prior to current illness? Yes - Recommend PT order Yes - Recommend PT order             COVID vaccination status:  Unknown  Consultants: Neurology PCCM Medicine   Procedures: Echocardiogram EEG Cortrack PEG tube placement Tracheostomy tube placement   Planned Discharge Destination:  Home with Home Health     Time spent: 15 minutes  Author: Erin Hearing, NP 03/19/2021 7:23 AM  For on call review [email or web address].

## 2021-03-19 NOTE — Plan of Care (Signed)
  Problem: Clinical Measurements: Goal: Will remain free from infection Outcome: Progressing Goal: Respiratory complications will improve Outcome: Progressing Goal: Cardiovascular complication will be avoided Outcome: Progressing   

## 2021-03-19 NOTE — Progress Notes (Signed)
Nutrition Follow-up  DOCUMENTATION CODES:   Not applicable  INTERVENTION:  Transition to bolus TF via PEG: -Provide 2 cartons (415m) Glucerna 1.5 TID, flush with 558m free water before and after each bolus -Provide an additional 20065mree water QID to meet fluid needs   Bolus TF regimen will provide 2136 kcals, 117 grams protein, 1080m28mee water (2180ml61mal free water with flushes)  Recommend continue 1 packet Juven BID, each packet provides 95 calories, 2.5 grams of protein (collagen), and 9.8 grams of carbohydrate (3 grams sugar); also contains 7 grams of L-arginine and L-glutamine, 300 mg vitamin C, 15 mg vitamin E, 1.2 mcg vitamin B-12, 9.5 mg zinc, 200 mg calcium, and 1.5 g  Calcium Beta-hydroxy-Beta-methylbutyrate to support wound healing  NUTRITION DIAGNOSIS:   Inadequate oral intake related to lethargy/confusion, dysphagia as evidenced by NPO status.  ongoing  GOAL:   Patient will meet greater than or equal to 90% of their needs  Met with TF  MONITOR:   Diet advancement, Labs, Weight trends  REASON FOR ASSESSMENT:   Ventilator, Consult Enteral/tube feeding initiation and management  ASSESSMENT:   55 ye72 old male who presented to the ED on 11/29 with AMS. PMH of HTN, T2DM, CKD stage III. 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  11/30 cortrak placed; tip gastric 12/2 trickle TF 12/3 pt intubated due to increased WOB, +emesis, TF held 12/4 trickle TF restarted 12/5 advancement orders placed 12/9 TF adjusted 12/13 s/p tracheostomy 12/16 s/p EGD and PEG placement 12/19 tx out of ICU to [facility]  [facility]23 developed Cheyne-Stokes respiratory pattern with respiratory alkalosis.Transferred to ICU for short-term mechanical ventilation and was found to have new tracheobronchitis now on cefepime 12/25 [facility] trach changed to #6 shiley cuffless  1/10 marks 30 days since trach placed   Pt continues  to be minimally responsive and tolerating ATC per CCM. Also continues to require TF via PEG and tolerating w/o issue.    Current TF: Glucerna 1.5 @ 60ml/21m/ 250ml f42mwater Q4H  Discussed pt with NP. Pt supposed to discharge home next week, so plan to transition pt to bolus feeding regimen so family can receive education/practice on home TF administration   UOP: 1625ml x295murs I/O: -7013ml sin3mdmit   Medications: SSI Q4H, 24 units levemir BID, imodium, nutrisource fiber BID, juven BID, protonix  Labs reviewed.  CBGs: [phone]:   Diet Order             Diet NPO time specified  Diet effective midnight                   EDUCATION NEEDS:   Not appropriate for education at this time  Skin:  Skin Assessment: Skin Integrity Issues: Skin Integrity Issues:: Stage II Stage II: anus  Last BM:  2/15  Height:   Ht Readings from Last 1 Encounters:  01/23/21 6' (1.829 m)    Weight:   Wt Readings from Last 1 Encounters:  03/16/21 83.5 kg    BMI:  Body mass index is 24.95 kg/m.  Estimated Nutritional Needs:   Kcal:  2150-2350  Protein:  105-120 grams  Fluid:  >2L     Ansleigh Safer A.[name], LDN (she/her/hers) RD pager number and weekend/on-call pager number located in [email or web address]

## 2021-03-20 ENCOUNTER — Inpatient Hospital Stay (HOSPITAL_COMMUNITY): Payer: 59

## 2021-03-20 DIAGNOSIS — K5900 Constipation, unspecified: Secondary | ICD-10-CM

## 2021-03-20 LAB — GLUCOSE, CAPILLARY
Glucose-Capillary: 108 mg/dL — ABNORMAL HIGH (ref 70–99)
Glucose-Capillary: 108 mg/dL — ABNORMAL HIGH (ref 70–99)
Glucose-Capillary: 108 mg/dL — ABNORMAL HIGH (ref 70–99)
Glucose-Capillary: 112 mg/dL — ABNORMAL HIGH (ref 70–99)
Glucose-Capillary: 115 mg/dL — ABNORMAL HIGH (ref 70–99)
Glucose-Capillary: 118 mg/dL — ABNORMAL HIGH (ref 70–99)
Glucose-Capillary: 99 mg/dL (ref 70–99)

## 2021-03-20 MED ORDER — DOCUSATE SODIUM 50 MG/5ML PO LIQD
100.0000 mg | Freq: Two times a day (BID) | ORAL | Status: DC
  Administered 2021-03-20 – 2021-03-21 (×3): 100 mg
  Filled 2021-03-20 (×3): qty 10

## 2021-03-20 MED ORDER — INSULIN DETEMIR 100 UNIT/ML ~~LOC~~ SOLN
12.0000 [IU] | Freq: Two times a day (BID) | SUBCUTANEOUS | Status: DC
  Administered 2021-03-20 – 2021-03-21 (×2): 12 [IU] via SUBCUTANEOUS
  Filled 2021-03-20 (×3): qty 0.12

## 2021-03-20 MED ORDER — MAGNESIUM HYDROXIDE 400 MG/5ML PO SUSP
30.0000 mL | Freq: Once | ORAL | Status: AC
  Administered 2021-03-20: 30 mL
  Filled 2021-03-20: qty 30

## 2021-03-20 MED ORDER — GLUCERNA 1.5 CAL PO LIQD
474.0000 mL | Freq: Three times a day (TID) | ORAL | Status: DC
  Filled 2021-03-20: qty 474

## 2021-03-20 MED ORDER — POLYETHYLENE GLYCOL 3350 17 G PO PACK
17.0000 g | PACK | Freq: Every day | ORAL | Status: DC
  Administered 2021-03-20: 17 g
  Filled 2021-03-20: qty 1

## 2021-03-20 MED ORDER — GLUCERNA 1.5 CAL PO LIQD
474.0000 mL | Freq: Three times a day (TID) | ORAL | Status: DC
  Administered 2021-03-21 – 2021-03-23 (×5): 474 mL
  Filled 2021-03-20 (×9): qty 474

## 2021-03-20 MED ORDER — CHOLESTYRAMINE 4 G PO PACK
4.0000 g | PACK | Freq: Three times a day (TID) | ORAL | Status: DC
  Filled 2021-03-20: qty 1

## 2021-03-20 NOTE — Procedures (Signed)
Tracheostomy Change Note  Patient Details:   Name: Stephen Delgado DOB: 24-Jul-1965 MRN: [id number]    Airway Documentation:     Evaluation  O2 sats: stable throughout Complications: No apparent complications Patient did tolerate procedure well. Bilateral Breath Sounds: Clear, Diminished  Patient's trach changed out due to it being 2 weeks since last change out. Patient tolerated procedure well. Positive color change was noted. Patient has bilateral breath sounds.     [place] 03/20/2021, 11:34 AM

## 2021-03-20 NOTE — Progress Notes (Signed)
Patient vomited during bolus feed administration. Trach suctioned with thick yellow mucus. Trach dressing and ties changed, linen changed. 02 saturations remains 100% on TC 5L A. Rennis Harding NP notified. Ordered KUB. Orders to hold Questran and bolus feeding today and resume tomorrow.

## 2021-03-20 NOTE — Assessment & Plan Note (Addendum)
Constipation has resolved, patient currently with diarrhea, lactulose discontinued

## 2021-03-20 NOTE — Plan of Care (Signed)
°  Problem: Nutrition: Goal: Adequate nutrition will be maintained Outcome: Progressing   Problem: Skin Integrity: Goal: Risk for impaired skin integrity will decrease Outcome: Progressing   Problem: Education: Goal: Knowledge of disease or condition will improve Outcome: Progressing

## 2021-03-20 NOTE — Progress Notes (Addendum)
Progress Note   Patient: Stephen Delgado [id number]:[id number] DOB: 1965-09-28 DOA: 12/30/2020     80 DOS: the patient was seen and examined on 03/20/2021      Brief hospital course: 56 y.o. male with PMH significant for DM2, HTN, CKD with questionable compliance to medications Patient presented to the ED on 12/30/2020 with complaint of headache, altered mental status   In the ED, he was hypertensive to 238/160, agitated, required restraints CT head showed large amount of intraventricular hemorrhage involving lateral/third/fourth ventricle, ICH and left basal ganglia as well as chronic microvascular ischemic changes. He was started on Cleviprex drip and admitted to neuro ICU   He subsequently had CT head repeated on 11/30, 12/1 and 12/3, all demonstrated unchanged intraparenchymal hemorrhage and intraventricular hemorrhage 12/3, MRI brain from also showed numerous small acute infarcts in both cerebral hemisphere with minimal improvement in the right superior cerebellum 12/3, intubated 12/13, tracheostomy 12/15, wean from vent to trach collar 12/16, PEG tube placement 12/16, repeat MRI showed an increase in the size and number of acute infarcts in the bilateral hemispheres with increased involvement in the cerebellum bilaterally.   12/18, transferred from neuro service to [facility]  12/21, palliative care consulted.  Family wanted to continue aggressive care as full CODE STATUS. 12/23 developed Cheyne-Stokes respiratory pattern with respiratory alkalosis.  Transferred to ICU for short-term mechanical ventilation and was found to have new tracheobronchitis now on cefepime 12/25 back to progressive floor and to [facility]   His hospital course has been complicated by significant neurological impairment, aspiration events, AKI, hypernatremia, urinary retention. 1/9 trach changed to #6.0 Shiley cuffless--1/10 > 30 days since trach inserted.  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  Discussed with pulmonary medicine and ID and recommendation was to not give antibiotics.    Diabetes mellitus type 2 in obese (HCC) -A1c 6.3 in 12/31/2020 -Continue Levemir to 24 units BID; continue SSI every 4 hours -CBGs well controlled  Malignant hypertension- (present on admission) Continue Coreg, hydralazine, and clonidine 2/8 increased p.m. Norvasc to 5 mg with improved BP control  Acute kidney injury on CKD 3b/acute azotemia with mild hypernatremia- (present on admission) -Baseline renal function: BUN 18 and cr 1.49 -Recent cr 1.18, BUN of 64 -Na 143-continue free water 250 cc q 4 hrs  Left scleral lesion/scleral hemorrhage -Injection resolved with NS drops   -Wife states dark pigmented areas in eyes have been there for multiple years without change -2/14 new medial scleral hemorrhage unclear from dry eye alone versus from unintended trauma -Continue prednisone drops 4 times daily for another 4 days and decrease to daily.  We will continue daily for 2 weeks then discontinue in favor of saline drops alone.    Protein-calorie  malnutrition/dysphagia due to recent stroke Nutrition Problem: Inadequate oral intake Etiology: lethargy/confusion, dysphagia  Signs/Symptoms: NPO status Interventions: Tube feeding via PEG Body mass index is 23.33 kg/m.     Transaminitis -2/2 Zyprexa -Resolved  Pneumonia due to Klebsiella pneumoniae [facility]) Resolved  Acute urinary retention -Continue Urecholine -Foley catheter discontinued on 1/11 and patient voiding easily  MSSA (methicillin susceptible Staphylococcus aureus) pneumonia (HCC) Resolved  Pressure injury of skin Resolved-prior anus ulcer  Pneumonia due to Serratia marcescens ([place]) Resolved  Constipation Patient developed abdominal distention with hypoactive bowel sounds and vomiting over the past 24 hours. Imaging reveals significant stool burden Feedings placed on hold until 7 AM 2/18 Given 1 dose of milk of magnesia and as needed laxatives changed to scheduled        Subjective:    Physical Exam: Vitals:   03/20/21 0421 03/20/21 0734 03/20/21 1103 03/20/21 1132  BP:  (!) 157/97 (!) 143/91 (!) 143/91  Pulse: 93 (!) 105 96 94  Resp: 12 15 17 16   Temp:  98.6 F (37 C)  98.3 F (36.8 C)  TempSrc:  Axillary  Axillary  SpO2: 100% 100% 99% 100%  Weight:      Height:       Constitutional: No acute distress, calm and unresponsive ENT: Stable medial canthus scleral hemorrhage-no signs of infection Respiratory: #6.0 Shiley cuffless trach, room air- lung sounds more coarse bilaterally,O2 sats between 100% -normal respiratory pattern without Cheyne-Stokes pattern today Cardiovascular: S1-S2, NSR, normotensive -no bilateral lower extremity edema  Abdomen:  Abdomen soft and nontender.  PEG tube -normoactive bowels -LBM 2/15 Neurologic: Upper extremities are flaccid to PROM.-No further hypertonicity of lower extremities elicited with PROM.  Psychiatric: Eyes are open.  Does not make eye contact or track    Data Reviewed: There are no new results to  review at this time.  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  At this time disposition remains in flux with TOC communicating on a regular basis with long-term care facilities as well as with the patient's primary insurance company.   A physical therapy consult is indicated based on the patients mobility assessment.   Mobility Assessment (last 72 hours)     Mobility Assessment     Row Name 03/19/21 2017 03/18/21 0800     Does patient have an order for bedrest or is patient medically unstable Yes- Bedfast (Level 1) - Complete Yes- Bedfast (Level 1) - Complete    What is the highest level of mobility based on the progressive mobility assessment? Level 1 (Bedfast) - Unable to balance while sitting on edge of bed Level 1 (Bedfast) - Unable to balance while sitting on edge of bed    Is the above level different from baseline mobility prior to current illness? Yes - Recommend PT order Yes - Recommend PT order             COVID vaccination status:  Unknown  Consultants: Neurology PCCM Medicine   Procedures: Echocardiogram EEG Cortrack PEG tube placement Tracheostomy tube placement   Planned Discharge Destination:  Home with Home Health     Time spent: 15 minutes  Author: Erin Hearing, NP 03/20/2021 12:19 PM  For on call review [email or web address].

## 2021-03-20 NOTE — TOC Progression Note (Addendum)
Transition of Care [facility]) - Progression Note    Patient Details  Name: Stephen Delgado MRN: [id number] Date of Birth: 1965-04-30  Transition of Care [facility]) CM/SW Contact  [name], RN Phone Number: 03/20/2021, 9:32 AM  Clinical Narrative:    CM spoke with Stephen Delgado, RNCM with [facility]  - who called to state that she followed up with the wife yesterday regarding discharge planning.  Hiscock, RNCM states that the patient's wife had an in-depth conversation on the phone yesterday and the wife states that she is unable to care for the patient at home at this time.  Hiscock, RNCM with [facility] is aware that the patient has received no bed offers for placement and [employer] CM requests continued search for bed offers outside of the local area for placement.  She is aware that the wife has been notified by Brooke Dare, DSS worker that she needs to spend down greater than $13,000.  [employer] CM has agreed to pay out of network fees for placement for this patient since the family is unwilling to agree to home discharge plan.  No bed offers for SNF placement are available at this time, but CM and MSW with DTP Team will continue to explore options for placement and/or safe discharge plan coordinated with the family.  03/20/2021 1024 - I called and spoke with Stephen Delgado, Regional director with [facility] facilities and she has no facilities at this time that have tracheostomy capability.  I also spoke with Stephen Delgado, [facility] Regional Director with [facility] facilities and asked for review of clinicals at this time and assistance with placement in the area.  Hiscock, RNCM with [facility] is aware that I have reached out to [facility] and will follow up regarding contract agreement if a bed is offered to assist with placement. - Clinicals were sent to [facility] to review.  I also spoke with [facility] and the facility has no bed openings at this time for LTC placement.  CM left a message with Stephen Delgado,  [facility] with [facility] to review as well since [facility] is willing to approve for out-of-network placement at a facility.  Stephen Delgado, RNCM asked that clinicals be forwarded to [facility] healthcare for review - [email or web address] for review.  CM called and spoke with the patient's wife, Stephen Delgado, on the phone to discuss discharge options and the wife states that she would like me to speak with the patient's sister-in-law, Stephen Delgado on the phone regarding patient's care needs and options for discharge at this time - CM and MSW will follow up.  03/20/2021 1139 - Stephen Delgado, CM with [employer] called to follow up after [employer] with [employer] reached out to request home services from referral sent earlier in the week.  I asked Stephen Delgado to reach out to [employer], CM with [facility] to discuss home private duty options covered under the policy.Marland Kitchen  Hiscock, RNCM with [facility] had stated in earlier phone conversation that the patient's [facility] policy did not offer private duty coverage but I asked Stephen Delgado to speak directly with the [facility] to discuss prior to offering options to the family.  [name] is requesting SNF placement at this time for a safe discharge plan, but Georgiana Shore will discuss clinical support through [employer] in case discharge to home can be offered to the family at a later date if no SNF beds can be found for the patient.  CM and MSW with DTP Team will continue to follow the patient for [facility]  needs.   Expected Discharge Plan: Home w Home Health Services Barriers to Discharge: Continued Medical Work up  Expected Discharge Plan and Services Expected Discharge Plan: Home w Home Health Services In-house Referral: Clinical Social Work, Hospice / Palliative Care, PCP / Health Connect Discharge Planning Services: CM Consult Post Acute Care Choice: Home Health Living arrangements for the past 2 months: Single Family Home                 DME Arranged: Hospital bed, Trach supplies,  Tube feeding, Oxygen DME Agency: [employer] Date DME Agency Contacted: 03/18/21 Time DME Agency Contacted: 1000 Representative spoke with at DME Agency: Jenness Corner, CM with [employer]             Social Determinants of Health (SDOH) Interventions    Readmission Risk Interventions Readmission Risk Prevention Plan 02/20/2021  Transportation Screening Complete  Medication Review (RN Care Manager) Complete  PCP or Specialist appointment within 3-5 days of discharge Not Complete  PCP/Specialist Appt Not Complete comments Patient will need LTC placement at SNF facility.  HRI or Home Care Consult Complete  SW Recovery Care/Counseling Consult Complete  Palliative Care Screening Complete  Skilled Nursing Facility Complete  Some recent data might be hidden

## 2021-03-21 DIAGNOSIS — K567 Ileus, unspecified: Secondary | ICD-10-CM

## 2021-03-21 LAB — CBC WITH DIFFERENTIAL/PLATELET
Abs Immature Granulocytes: 0.11 10*3/uL — ABNORMAL HIGH (ref 0.00–0.07)
Basophils Absolute: 0 10*3/uL (ref 0.0–0.1)
Basophils Relative: 1 %
Eosinophils Absolute: 0.2 10*3/uL (ref 0.0–0.5)
Eosinophils Relative: 2 %
HCT: 31.2 % — ABNORMAL LOW (ref 39.0–52.0)
Hemoglobin: 9.8 g/dL — ABNORMAL LOW (ref 13.0–17.0)
Immature Granulocytes: 1 %
Lymphocytes Relative: 22 %
Lymphs Abs: 1.9 10*3/uL (ref 0.7–4.0)
MCH: 24.1 pg — ABNORMAL LOW (ref 26.0–34.0)
MCHC: 31.4 g/dL (ref 30.0–36.0)
MCV: 76.7 fL — ABNORMAL LOW (ref 80.0–100.0)
Monocytes Absolute: 1 10*3/uL (ref 0.1–1.0)
Monocytes Relative: 11 %
Neutro Abs: 5.5 10*3/uL (ref 1.7–7.7)
Neutrophils Relative %: 63 %
Platelets: 203 10*3/uL (ref 150–400)
RBC: 4.07 MIL/uL — ABNORMAL LOW (ref 4.22–5.81)
RDW: 17.3 % — ABNORMAL HIGH (ref 11.5–15.5)
WBC: 8.6 10*3/uL (ref 4.0–10.5)
nRBC: 0 % (ref 0.0–0.2)

## 2021-03-21 LAB — COMPREHENSIVE METABOLIC PANEL
ALT: 47 U/L — ABNORMAL HIGH (ref 0–44)
AST: 21 U/L (ref 15–41)
Albumin: 2.5 g/dL — ABNORMAL LOW (ref 3.5–5.0)
Alkaline Phosphatase: 77 U/L (ref 38–126)
Anion gap: 10 (ref 5–15)
BUN: 48 mg/dL — ABNORMAL HIGH (ref 6–20)
CO2: 24 mmol/L (ref 22–32)
Calcium: 10.1 mg/dL (ref 8.9–10.3)
Chloride: 102 mmol/L (ref 98–111)
Creatinine, Ser: 1.06 mg/dL (ref 0.61–1.24)
GFR, Estimated: >60 mL/min (ref >60)
Glucose, Bld: 94 mg/dL (ref 70–99)
Potassium: 4.1 mmol/L (ref 3.5–5.1)
Sodium: 136 mmol/L (ref 135–145)
Total Bilirubin: 0.5 mg/dL (ref 0.3–1.2)
Total Protein: 6.8 g/dL (ref 6.5–8.1)

## 2021-03-21 LAB — GLUCOSE, CAPILLARY
Glucose-Capillary: 107 mg/dL — ABNORMAL HIGH (ref 70–99)
Glucose-Capillary: 111 mg/dL — ABNORMAL HIGH (ref 70–99)
Glucose-Capillary: 113 mg/dL — ABNORMAL HIGH (ref 70–99)
Glucose-Capillary: 115 mg/dL — ABNORMAL HIGH (ref 70–99)
Glucose-Capillary: 158 mg/dL — ABNORMAL HIGH (ref 70–99)
Glucose-Capillary: 93 mg/dL (ref 70–99)

## 2021-03-21 MED ORDER — LACTULOSE 10 GM/15ML PO SOLN
10.0000 g | Freq: Two times a day (BID) | ORAL | Status: DC
  Administered 2021-03-21 – 2021-03-22 (×2): 10 g
  Filled 2021-03-21 (×2): qty 30

## 2021-03-21 MED ORDER — ENOXAPARIN SODIUM 40 MG/0.4ML IJ SOSY
40.0000 mg | PREFILLED_SYRINGE | Freq: Every day | INTRAMUSCULAR | Status: DC
  Administered 2021-03-21 – 2021-05-07 (×48): 40 mg via SUBCUTANEOUS
  Filled 2021-03-21 (×48): qty 0.4

## 2021-03-21 MED ORDER — POLYETHYLENE GLYCOL 3350 17 G PO PACK
17.0000 g | PACK | Freq: Two times a day (BID) | ORAL | Status: DC
Start: 2021-03-21 — End: 2021-03-21
  Administered 2021-03-21: 17 g
  Filled 2021-03-21: qty 1

## 2021-03-21 MED ORDER — BISACODYL 10 MG RE SUPP
10.0000 mg | Freq: Once | RECTAL | Status: AC
  Administered 2021-03-21: 10 mg via RECTAL
  Filled 2021-03-21: qty 1

## 2021-03-21 NOTE — Plan of Care (Signed)
  Problem: Nutrition: Goal: Adequate nutrition will be maintained Outcome: Progressing   Problem: Elimination: Goal: Will not experience complications related to bowel motility Outcome: Progressing   Problem: Skin Integrity: Goal: Risk for impaired skin integrity will decrease Outcome: Progressing   

## 2021-03-21 NOTE — Progress Notes (Signed)
Patient vomited after 10am med administration. Trach suctioned, thick yellow mucus. Oral care provided, full bath and linen changed.  Dr. Sharon Seller notified of event.

## 2021-03-21 NOTE — Progress Notes (Signed)
Stephen Delgado  [id number] DOB: 05/10/1965 DOA: 12/30/2020 PCP: Patient, [name]    Brief Narrative:  56 year old with a history of DM 2, HTN, and CKD who presented to the ED 12/30/2020 with a headache and altered mental status.  He was found to have a blood pressure of 238/160 and was agitated.  CT head noted a small amount of intraventricular hemorrhage as well as chronic microvascular ischemic changes.  He was started on a Cleviprex drip and admitted to the neuro ICU.  Significant events:  Subsequent CT head 11/3, 12/1, 12/3 demonstrated unchanged intraparenchymal hemorrhage and intraventricular hemorrhage 12/3 MRI brain noted numerous small acute infarcts B cerebral hemisphere with minimal improvement in the right superior cerebellum 12/3 intubated 12/13 tracheostomy 12/15 wean from vent 12/16 PEG placed  12/16 repeat MRI noted increase in the size and number of acute infarcts in the bilateral hemispheres with increased involvement in the cerebellum bilaterally 12/18 Tx from Neuro service to [facility]  12/21 Palliative Care consulted > Family wanted to continue aggressive care/Full Code 12/23 Cheyne-Stokes respiratory pattern with respiratory alkalosis > to ICU for short-term mechanical ventilation - new tracheobronchitis  12/25 back to progressive floor and to [facility] 1/9 trach changed to #6 Shiley cuffless 2/17 routine tracheostomy change   Consultants:  PCCM Palliative Care Neurology   Code Status: FULL CODE  DVT prophylaxis: Subcutaneous heparin  Interim Hx: Afebrile.  Mild sinus tachycardia at 100-104.  Saturations stable on trach collar.  CBG controlled.  Electrolytes balanced.  Hemoglobin stable.  Eyes open but does not interact with examiner.  No evidence of respiratory distress or uncontrolled pain.  Ongoing difficulty with constipation/lack of bowel movement and vomiting with attempts to administer treatment per PEG tube.  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 Ongoing trach care  DM2 A1c 6.3 -CBG currently well controlled  Malignant hypertension POA Blood pressure now better controlled  Acute kidney injury on CKD stage IIIb Creatinine 1.4 not at baseline -acute injury has resolved with creatinine now better than his reported baseline  Left sclera lesion with some conjunctival hemorrhage Empiric prednisone drops being administered for short course  Protein calorie malnutrition Continue tube feeds via PEG tube  Dysphagia Due to neurologic insults noted above -PEG tube dependent  Constipation Tube feeds temporarily placed on hold -bowel regimen per PEG  Family Communication: No family present at time of exam Disposition: Awaiting SNF placement which is proving very difficult  Objective: Blood pressure 133/89, pulse (!) 104, temperature 99.3 F (37.4 C), temperature source Axillary, resp. rate 16, height 6' (1.829 m), weight 83.5 kg, SpO2 96 %.  Intake/Output Summary (Last 24 hours) at 03/21/2021 0918 Last data filed at 03/21/2021 0603 Gross per 24 hour  Intake --  Output 1300 ml  Net -1300 ml   Filed Weights   03/13/21 0353 03/15/21 0509 03/16/21 0500  Weight: 83.9 kg 83.9 kg 83.5 kg    Examination: General: No acute respiratory distress Lungs: Clear to auscultation bilaterally without wheezes or crackles Cardiovascular: Regular rate and rhythm without murmur gallop or rub normal S1 and S2 Abdomen: PEG  tube insertion unremarkable, soft,  bowel sounds hypoactive but present, no rebound Extremities: No significant edema bilateral lower extremities  CBC: Recent Labs  Lab 03/21/21 0501  WBC 8.6  NEUTROABS 5.5  HGB 9.8*  HCT 31.2*  MCV 76.7*  PLT 123456   Basic Metabolic Panel: Recent Labs  Lab 03/21/21 0501  NA 136  K 4.1  CL 102  CO2 24  GLUCOSE 94  BUN 48*  CREATININE 1.06  CALCIUM 10.1   GFR: Estimated Creatinine Clearance: 84.4 mL/min (by C-G formula based on SCr of 1.06 mg/dL).  Liver Function Tests: Recent Labs  Lab 03/21/21 0501  AST 21  ALT 47*  ALKPHOS 77  BILITOT 0.5  PROT 6.8  ALBUMIN 2.5*    HbA1C: Hemoglobin A1C  Date/Time Value Ref Range Status  08/18/2017 03:42 PM 5.7 (A) 4.0 - 5.6 % Final    Comment:    CBG 134  06/10/2016 02:58 PM 6.4  Final   Hgb A1c MFr Bld  Date/Time Value Ref Range Status  12/31/2020 02:40 AM 6.3 (H) 4.8 - 5.6 % Final    Comment:    (NOTE)         Prediabetes: 5.7 - 6.4         Diabetes: >6.4         Glycemic control for adults with diabetes: <7.0   04/19/2015 06:22 PM 6.0 (H) 4.8 - 5.6 % Final    Comment:    (NOTE)         Pre-diabetes: 5.7 - 6.4         Diabetes: >6.4         Glycemic control for adults with diabetes: <7.0     Scheduled Meds:  amantadine  100 mg Per Tube BID   amLODipine  5 mg Per Tube QHS   baclofen  10 mg Per Tube TID AC & HS   bethanechol  10 mg Per Tube TID   carvedilol  25 mg Per Tube BID WC   chlorhexidine  15 mL Mouth Rinse BID   cholestyramine  4 g Per Tube TID   cloNIDine  0.2 mg Per Tube BID   docusate  100 mg Per Tube BID   feeding supplement (GLUCERNA 1.5 CAL)  474 mL Per Tube TID   fiber  1 packet Per Tube BID   free water  200 mL Per Tube QID   heparin injection (subcutaneous)  5,000 Units Subcutaneous Q8H   hydrALAZINE  100 mg Per Tube Q8H   insulin aspart  0-20 Units Subcutaneous Q4H   insulin detemir  12 Units Subcutaneous BID   loperamide HCl  1 mg Per Tube Daily    mouth rinse  15 mL Mouth Rinse q12n4p   nutrition supplement (JUVEN)  1 packet Per Tube BID BM   OLANZapine  2.5 mg Per Tube QHS   oxyCODONE  2.5 mg Per Tube Q8H   pantoprazole sodium  40 mg Per Tube Daily   polyethylene glycol  17 g Per Tube Daily   polyvinyl alcohol  2 drop Left Eye Q6H   prednisoLONE acetate  1 drop Left Eye QID   scopolamine  1 patch Transdermal Q72H     LOS: 81 days   Cherene Altes, MD [facility] Office  [phone] Pager - Text Page per Amion  If 7PM-7AM, please contact night-coverage per Amion 03/21/2021, 9:18 AM

## 2021-03-22 LAB — GLUCOSE, CAPILLARY
Glucose-Capillary: 117 mg/dL — ABNORMAL HIGH (ref 70–99)
Glucose-Capillary: 120 mg/dL — ABNORMAL HIGH (ref 70–99)
Glucose-Capillary: 131 mg/dL — ABNORMAL HIGH (ref 70–99)
Glucose-Capillary: 132 mg/dL — ABNORMAL HIGH (ref 70–99)
Glucose-Capillary: 160 mg/dL — ABNORMAL HIGH (ref 70–99)
Glucose-Capillary: 166 mg/dL — ABNORMAL HIGH (ref 70–99)
Glucose-Capillary: 206 mg/dL — ABNORMAL HIGH (ref 70–99)

## 2021-03-22 MED ORDER — FREE WATER
250.0000 mL | Freq: Four times a day (QID) | Status: DC
  Administered 2021-03-22 – 2021-03-23 (×3): 250 mL

## 2021-03-22 MED ORDER — LACTULOSE 10 GM/15ML PO SOLN
10.0000 g | Freq: Three times a day (TID) | ORAL | Status: DC
  Administered 2021-03-22 – 2021-03-23 (×4): 10 g
  Filled 2021-03-22 (×4): qty 30

## 2021-03-22 NOTE — Progress Notes (Addendum)
Stephen Delgado  [id number] DOB: Jan 27, 1966 DOA: 12/30/2020 PCP: Patient, [name]    Brief Narrative:  [id number] with a history of DM 2, HTN, and CKD who presented to the ED 12/30/2020 with a headache and altered mental status.  He was found to have a blood pressure of 238/160 and was agitated.  CT head noted a small amount of intraventricular hemorrhage as well as chronic microvascular ischemic changes.  He was started on a Cleviprex drip and admitted to the neuro ICU.  Significant events:  Subsequent CT head 11/3, 12/1, 12/3 demonstrated unchanged intraparenchymal hemorrhage and intraventricular hemorrhage 12/3 MRI brain noted numerous small acute infarcts B cerebral hemisphere with minimal improvement in the right superior cerebellum 12/3 intubated 12/13 tracheostomy 12/15 wean from vent 12/16 PEG placed  12/16 repeat MRI noted increase in the size and number of acute infarcts in the bilateral hemispheres with increased involvement in the cerebellum bilaterally 12/18 Tx from Neuro service to [facility]  12/21 Palliative Care consulted > Family wanted to continue aggressive care/Full Code 12/23 Cheyne-Stokes respiratory pattern with respiratory alkalosis > to ICU for short-term mechanical ventilation - new tracheobronchitis  12/25 back to progressive floor and to [facility] 1/9 trach changed to #6 Shiley cuffless 2/17 routine tracheostomy change   Consultants:  PCCM Palliative Care Neurology   Code Status: FULL CODE  DVT prophylaxis: Subcutaneous heparin  Interim Hx: Afebrile.  Mild tachycardia with heart rate up to 111.  Blood pressure modestly elevated at 0000000 systolic.  Saturations stable on trach collar.  No acute events reported overnight.  Appears to be resting comfortably at the time of visit.  No evidence of distress or uncontrolled pain.  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 Ongoing trach care  DM2 A1c 6.3 -CBG currently well controlled  Malignant hypertension POA Blood pressure trending up a bit today -monitor trend without change for now  Acute kidney injury on CKD stage IIIb Creatinine 1.4 at baseline -acute injury has resolved with creatinine now better than his reported baseline  Left sclera lesion with some conjunctival hemorrhage Discontinue steroid drops to avoid complications with prolonged use -monitor with scheduled moisturizing drops with  Protein calorie malnutrition Continue tube feeds via PEG tube  Dysphagia Due to neurologic insults noted above -PEG tube dependent  Constipation Began having bowel movements last night -resume tube feeds and monitor  Family Communication: No family present at time of exam Disposition: Awaiting SNF placement which is proving very difficult  Objective: Blood pressure (!) 164/89, pulse (!) 111, temperature 98 F (36.7 C), temperature source Oral, resp. rate 18, height 6' (1.829 m), weight 83.5 kg, SpO2 98 %.  Intake/Output Summary (Last 24 hours) at 03/22/2021 0839 Last data filed at 03/22/2021 [id number] Gross per 24 hour  Intake 7450 ml  Output 900 ml  Net 6550 ml    Filed Weights   03/13/21 0353 03/15/21 0509 03/16/21 0500  Weight: 83.9 kg 83.9 kg 83.5 kg    Examination: General: No acute respiratory distress Lungs: Clear to auscultation bilaterally  Cardiovascular: Regular rate and rhythm  without murmur  Abdomen: PEG tube  insertion unremarkable, soft, bowel sounds hypoactive but present, no rebound Extremities: No edema bilateral lower extremities  CBC: Recent Labs  Lab 03/21/21 0501  WBC 8.6  NEUTROABS 5.5  HGB 9.8*  HCT 31.2*  MCV 76.7*  PLT 123456    Basic Metabolic Panel: Recent Labs  Lab 03/21/21 0501  NA 136  K 4.1  CL 102  CO2 24  GLUCOSE 94  BUN 48*  CREATININE 1.06  CALCIUM 10.1    GFR: Estimated Creatinine Clearance: 84.4 mL/min (by C-G formula based on SCr of 1.06 mg/dL).  Liver Function Tests: Recent Labs  Lab 03/21/21 0501  AST 21  ALT 47*  ALKPHOS 77  BILITOT 0.5  PROT 6.8  ALBUMIN 2.5*     HbA1C: Hemoglobin A1C  Date/Time Value Ref Range Status  08/18/2017 03:42 PM 5.7 (A) 4.0 - 5.6 % Final    Comment:    CBG 134  06/10/2016 02:58 PM 6.4  Final   Hgb A1c MFr Bld  Date/Time Value Ref Range Status  12/31/2020 02:40 AM 6.3 (H) 4.8 - 5.6 % Final    Comment:    (NOTE)         Prediabetes: 5.7 - 6.4         Diabetes: >6.4         Glycemic control for adults with diabetes: <7.0   04/19/2015 06:22 PM 6.0 (H) 4.8 - 5.6 % Final    Comment:    (NOTE)         Pre-diabetes: 5.7 - 6.4         Diabetes: >6.4         Glycemic control for adults with diabetes: <7.0     Scheduled Meds:  amantadine  100 mg Per Tube BID   amLODipine  5 mg Per Tube QHS   baclofen  10 mg Per Tube TID AC & HS   carvedilol  25 mg Per Tube BID WC   chlorhexidine  15 mL Mouth Rinse BID   enoxaparin (LOVENOX) injection  40 mg Subcutaneous Daily   feeding supplement (GLUCERNA 1.5 CAL)  474 mL Per Tube TID   free water  200 mL Per Tube QID   hydrALAZINE  100 mg Per Tube Q8H   insulin aspart  0-20 Units Subcutaneous Q4H   lactulose  10 g Per Tube BID   mouth rinse  15 mL Mouth Rinse q12n4p   nutrition supplement (JUVEN)  1 packet Per Tube BID BM   OLANZapine  2.5 mg Per Tube QHS   oxyCODONE  2.5 mg Per Tube Q8H   pantoprazole sodium  40 mg Per Tube Daily   polyvinyl alcohol  2 drop  Left Eye Q6H   prednisoLONE acetate  1 drop Left Eye QID   scopolamine  1 patch Transdermal Q72H     LOS: 82 days   Cherene Altes, MD [facility] Office  [phone] Pager - Text Page per Amion  If 7PM-7AM, please contact night-coverage per Amion 03/22/2021, 8:39 AM

## 2021-03-23 ENCOUNTER — Inpatient Hospital Stay (HOSPITAL_COMMUNITY): Payer: 59

## 2021-03-23 DIAGNOSIS — K5901 Slow transit constipation: Secondary | ICD-10-CM

## 2021-03-23 LAB — GLUCOSE, CAPILLARY
Glucose-Capillary: 151 mg/dL — ABNORMAL HIGH (ref 70–99)
Glucose-Capillary: 199 mg/dL — ABNORMAL HIGH (ref 70–99)
Glucose-Capillary: 183 mg/dL — ABNORMAL HIGH (ref 70–99)
Glucose-Capillary: 179 mg/dL — ABNORMAL HIGH (ref 70–99)
Glucose-Capillary: 142 mg/dL — ABNORMAL HIGH (ref 70–99)

## 2021-03-23 MED ORDER — GLUCERNA 1.5 CAL PO LIQD
474.0000 mL | Freq: Three times a day (TID) | ORAL | Status: DC
  Administered 2021-03-24 – 2021-03-25 (×5): 474 mL
  Filled 2021-03-23 (×6): qty 474

## 2021-03-23 MED ORDER — FREE WATER
250.0000 mL | Freq: Four times a day (QID) | Status: DC
  Administered 2021-03-24 – 2021-03-27 (×15): 250 mL

## 2021-03-23 MED ORDER — AMLODIPINE BESYLATE 10 MG PO TABS
10.0000 mg | ORAL_TABLET | Freq: Every day | ORAL | Status: DC
  Administered 2021-03-23 – 2021-04-29 (×38): 10 mg
  Filled 2021-03-23 (×38): qty 1

## 2021-03-23 MED ORDER — LACTULOSE 10 GM/15ML PO SOLN
20.0000 g | Freq: Three times a day (TID) | ORAL | Status: DC
  Administered 2021-03-23 – 2021-03-27 (×13): 20 g
  Filled 2021-03-23 (×13): qty 30

## 2021-03-23 NOTE — TOC Progression Note (Addendum)
Transition of Care [facility]) - Progression Note    Patient Details  Name: Stephen Delgado MRN: [id number] Date of Birth: 30-Jul-1965  [facility] CM/SW Contact  Carley Hammed, [place] Phone Number: 03/23/2021, 11:06 AM  Clinical Narrative:    CSW followed up with [facility] to see if they had made a determination ion bed placement. Wille Celeste noted she had not received confirmation yet if they would be able to accept pt. She noted they did not have any open beds at the moment. TOC/ DTP will continue to follow for DC needs.   Expected Discharge Plan: Home w Home Health Services Barriers to Discharge: Continued Medical Work up  Expected Discharge Plan and Services Expected Discharge Plan: Home w Home Health Services In-house Referral: Clinical Social Work, Hospice / Palliative Care, PCP / Health Connect Discharge Planning Services: CM Consult Post Acute Care Choice: Home Health Living arrangements for the past 2 months: Single Family Home                 DME Arranged: Hospital bed, Trach supplies, Tube feeding, Oxygen DME Agency: [employer] Date DME Agency Contacted: 03/18/21 Time DME Agency Contacted: 1000 Representative spoke with at DME Agency: Jenness Corner, CM with [employer]             Social Determinants of Health (SDOH) Interventions    Readmission Risk Interventions Readmission Risk Prevention Plan 02/20/2021  Transportation Screening Complete  Medication Review (RN Care Manager) Complete  PCP or Specialist appointment within 3-5 days of discharge Not Complete  PCP/Specialist Appt Not Complete comments Patient will need LTC placement at SNF facility.  HRI or Home Care Consult Complete  SW Recovery Care/Counseling Consult Complete  Palliative Care Screening Complete  Skilled Nursing Facility Complete  Some recent data might be hidden

## 2021-03-23 NOTE — Progress Notes (Signed)
Progress Note   Patient: Stephen CoyerJerry N Kramme [id number] DOB: October 08, 1965 DOA: 12/30/2020     83 DOS: the patient was seen and examined on 03/23/2021      Brief hospital course: 56 y.o. male with PMH significant for DM2, HTN, CKD with questionable compliance to medications Patient presented to the ED on 12/30/2020 with complaint of headache, altered mental status   In the ED, he was hypertensive to 238/160, agitated, required restraints CT head showed large amount of intraventricular hemorrhage involving lateral/third/fourth ventricle, ICH and left basal ganglia as well as chronic microvascular ischemic changes. He was started on Cleviprex drip and admitted to neuro ICU   He subsequently had CT head repeated on 11/30, 12/1 and 12/3, all demonstrated unchanged intraparenchymal hemorrhage and intraventricular hemorrhage 12/3, MRI brain from also showed numerous small acute infarcts in both cerebral hemisphere with minimal improvement in the right superior cerebellum 12/3, intubated 12/13, tracheostomy 12/15, wean from vent to trach collar 12/16, PEG tube placement 12/16, repeat MRI showed an increase in the size and number of acute infarcts in the bilateral hemispheres with increased involvement in the cerebellum bilaterally.   12/18, transferred from neuro service to [facility]  12/21, palliative care consulted.  Family wanted to continue aggressive care as full CODE STATUS. 12/23 developed Cheyne-Stokes respiratory pattern with respiratory alkalosis.  Transferred to ICU for short-term mechanical ventilation and was found to have new tracheobronchitis now on cefepime 12/25 [facility]   His hospital course has been complicated by significant neurological impairment, aspiration events, AKI, hypernatremia, urinary retention. 1/9 trach changed to #6.0 Shiley cuffless--1/10 > 30 days since trach inserted.  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 Norvasc to 10 mg   Acute kidney injury on CKD 3b/acute azotemia with mild hypernatremia- (present on admission) -Baseline renal function: BUN 18 and cr 1.49 -Recent cr 1.06 -Na stable-continue free water 250 cc q 4 hrs  Left scleral lesion/scleral hemorrhage -Injection resolved with NS drops   -Wife states dark pigmented areas in eyes have been there for multiple years without change -2/14 new medial scleral hemorrhage unclear from dry eye alone versus from unintended trauma -Continue prednisone drops 4 times daily for another 4 days and decrease to daily.  We will continue daily for 2 weeks then discontinue in favor of saline drops alone.    Protein-calorie malnutrition/dysphagia due to recent stroke Nutrition Problem: Inadequate oral intake Etiology: lethargy/confusion, dysphagia Signs/Symptoms: NPO status Interventions: Tube feeding via PEG Body mass index  is 23.33 kg/m.     Transaminitis -2/2 Zyprexa -Resolved  Pneumonia due to Klebsiella pneumoniae [facility](HCC) Resolved  Acute urinary retention -Continue Urecholine -Foley catheter discontinued on 1/11 and patient voiding easily  MSSA (methicillin susceptible Staphylococcus aureus) pneumonia (HCC) Resolved  Pressure injury of skin Resolved-prior anus ulcer  Pneumonia due to Serratia marcescens ([place]) Resolved  Constipation Symptoms have resolved after laxatives given        Subjective:  Essentially unresponsive  Did open eyes to voice and tactile stimulation but did not appear to focus or track.  Physical Exam: Vitals:   03/23/21 0400 03/23/21 0500 03/23/21 0724 03/23/21 0913  BP: (!) 164/112 (!) 168/95  133/81  Pulse: (!) 112 100  (!) 110  Resp: 19 18  18   Temp: 99.1 F (37.3 C) 98.9 F (37.2 C)  98.9 F (37.2 C)  TempSrc: Oral Oral  Axillary  SpO2: 100% 100% 98% 100%  Weight:      Height:       Constitutional: No acute distress, unresponsive ENT: Stable medial canthus scleral hemorrhage-no signs of infection Respiratory: #6.0 Shiley cuffless trach, room air- lung sounds CTA bilaterally,O2 sats between 100% -normal respiratory pattern  Cardiovascular: S1-S2, NSR, normotensive -no bilateral lower extremity edema  Abdomen:  Abdomen soft and nontender.  PEG tube -normoactive bowels -LBM 2/19 Neurologic: Upper extremities are flaccid to PROM.-No further hypertonicity of lower extremities elicited with PROM.  Psychiatric: Eyes are open.  Does not make eye contact or track    Data Reviewed: There are no new results to review at this time.  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  At this time  disposition remains in flux with TOC communicating on a regular basis with long-term care facilities as well as with the patient's primary insurance company.   A physical therapy consult is indicated based on the patients mobility assessment.   Mobility Assessment (last 72 hours)     Mobility Assessment     Row Name 03/22/21 2000 03/22/21 0746 03/20/21 2007   Does patient have an order for bedrest or is patient medically unstable Yes- Bedfast (Level 1) - Complete Yes- Bedfast (Level 1) - Complete --   What is the highest level of mobility based on the progressive mobility assessment? Level 1 (Bedfast) - Unable to balance while sitting on edge of bed Level 1 (Bedfast) - Unable to balance while sitting on edge of bed Level 1 (Bedfast) - Unable to balance while sitting on edge of bed   Is the above level different from baseline mobility prior to current illness? -- Yes - Recommend PT order Yes - Recommend PT order            COVID vaccination status:  Unknown  Consultants: Neurology PCCM Medicine   Procedures: Echocardiogram EEG Cortrack PEG tube placement Tracheostomy tube placement   Planned Discharge Destination:  Home with Home Health     Time spent: 15 minutes  Author: Erin Hearing, NP 03/23/2021 10:21 AM  For on call review [email or web address].

## 2021-03-23 NOTE — Progress Notes (Signed)
° °  NAME:  YOHANNES THOMASTON, MRN:  [id number], DOB:  [birth year], LOS: 50 ADMISSION DATE:  12/30/2020, CONSULTATION DATE:  12/19 REFERRING MD:  Erlinda Hong, CHIEF COMPLAINT:  Dyspnea   History of Present Illness:  56 yo male presented with headache, nausea and altered mental status.  CT head showed acute Lt ICH with IVH likely related to hypertensive emergency (BP 195/116). Had persistent hypertension and concern for airway protection, and PCCM asked to assist with ICU management.  Tracheostomy 12/13.  Moved out of ICU.   Pertinent  Medical History  HTN, DM type 2, CKD 3a, OSA  Significant Hospital Events: Including procedures, antibiotic start and stop dates in addition to other pertinent events   11/29 Admit, start cleviprex 12/01 PCCM consulted; changed from cleviprex to cardene due to elevated triglycerides 12/03 intubated; episode of vomiting >> tube feeds held 12/04 resume trickle tube feeds 12/06 remains minimally responsive. Tolerating SBT.  12/12 no acute events overnight, T-max 102 point 12/13 bedside trach planned midmorning  12/14 ATC. Lasix --> 5L UOP  12/15 cont on trach collar. Na to 154 from 145. Adding low rate d5  12/16 scheduled for PEG. Cont D5. Continues on trach collar Out of ICU 12/19 12/23 Cheyne-Stokes respirations noted and was transferred to ICU for short-term ventilation.  12/25 Returned to floor 1/9 Trach changed to 6 cuffless  2/6 Trach follow up, no major changes, remains on ATC   Imaging/Studies: CT head 11/29 >> large amount of IVH involving lateral/3rd/4th ventricles, ICH in Lt BG, chronic microvascular ischemic changes Echo 11/30 >> EF 50 to 55%, mod LVH, grade 1 DD, ascending aorta 37 mm EEG 12/01 >> continuous generalized slowing MRI brain 12/03 >> numerous small acute infarcts in b/l cerebral hemispheres, Lt caudate hemorrhage, IVH, scattered sulcal SAH, mild communicating hydrocephalus CT head 12/23 Expected evolution of strokes, no shift or  herniation    Interim History / Subjective:  No sig changes  Objective   Blood pressure 133/81, pulse (Abnormal) 110, temperature 98.9 F (37.2 C), temperature source Axillary, resp. rate 18, height 6' (1.829 m), weight 83.5 kg, SpO2 100 %.    FiO2 (%):  [21 %-28 %] 21 %   Intake/Output Summary (Last 24 hours) at 03/23/2021 1002 Last data filed at 03/23/2021 0650 Gross per 24 hour  Intake 874 ml  Output 1000 ml  Net -126 ml   Filed Weights   03/13/21 0353 03/15/21 0509 03/16/21 0500  Weight: 83.9 kg 83.9 kg 83.5 kg   Physical exam  General 56 year old male who remains unresponsive HENT 6 cuffless trach w/ some thick secretions  Pulm scattered rhonchi 28% ATC Card rrr Abd soft  Ext warm  Neuro eyes open. 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

## 2021-03-23 NOTE — Progress Notes (Signed)
Vomiting up tube feeds shortly after bolus tube feeds.  MD notified. Abdomen distended.  0 residual prior to tube feeds.  Respiratory at bedside

## 2021-03-24 LAB — GLUCOSE, CAPILLARY
Glucose-Capillary: 130 mg/dL — ABNORMAL HIGH (ref 70–99)
Glucose-Capillary: 148 mg/dL — ABNORMAL HIGH (ref 70–99)
Glucose-Capillary: 151 mg/dL — ABNORMAL HIGH (ref 70–99)
Glucose-Capillary: 155 mg/dL — ABNORMAL HIGH (ref 70–99)
Glucose-Capillary: 169 mg/dL — ABNORMAL HIGH (ref 70–99)
Glucose-Capillary: 180 mg/dL — ABNORMAL HIGH (ref 70–99)
Glucose-Capillary: 183 mg/dL — ABNORMAL HIGH (ref 70–99)

## 2021-03-24 MED ORDER — FLEET ENEMA 7-19 GM/118ML RE ENEM
1.0000 | ENEMA | Freq: Once | RECTAL | Status: AC
  Administered 2021-03-24: 1 via RECTAL
  Filled 2021-03-24: qty 1

## 2021-03-24 NOTE — Progress Notes (Signed)
Informed by staff per RT that Pt was having an vomiting episode after a productive coughing occurrence. Entered Pt's room and observed moderate amount of partially digested cream colored feeding in front of patient, on clothes and bed linens, provided airway clearance with oral suctioning and suctioning of clothing/bedding in front of patient noting an approximately volume. Personal care and repositioning provided. MD and wife notified. Monitoring for changes.

## 2021-03-24 NOTE — Progress Notes (Signed)
Progress Note   Patient: Stephen Delgado [id number] DOB: 17-Jan-1966 DOA: 12/30/2020     84 DOS: the patient was seen and examined on 03/24/2021      Brief hospital course: 56 y.o. male with PMH significant for DM2, HTN, CKD with questionable compliance to medications Patient presented to the ED on 12/30/2020 with complaint of headache, altered mental status   In the ED, he was hypertensive to 238/160, agitated, required restraints CT head showed large amount of intraventricular hemorrhage involving lateral/third/fourth ventricle, ICH and left basal ganglia as well as chronic microvascular ischemic changes. He was started on Cleviprex drip and admitted to neuro ICU   He subsequently had CT head repeated on 11/30, 12/1 and 12/3, all demonstrated unchanged intraparenchymal hemorrhage and intraventricular hemorrhage 12/3, MRI brain from also showed numerous small acute infarcts in both cerebral hemisphere with minimal improvement in the right superior cerebellum 12/3, intubated 12/13, tracheostomy 12/15, wean from vent to trach collar 12/16, PEG tube placement 12/16, repeat MRI showed an increase in the size and number of acute infarcts in the bilateral hemispheres with increased involvement in the cerebellum bilaterally.   12/18, transferred from neuro service to [facility]  12/21, palliative care consulted.  Family wanted to continue aggressive care as full CODE STATUS. 12/23 developed Cheyne-Stokes respiratory pattern with respiratory alkalosis.  Transferred to ICU for short-term mechanical ventilation and was found to have new tracheobronchitis now on cefepime 12/25 back to progressive floor and to [facility]   His hospital course has been complicated by significant neurological impairment, aspiration events, AKI, hypernatremia, urinary retention. 1/9 trach changed to #6.0 Shiley cuffless--1/10 > 30 days since trach inserted.  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 Norvasc to 10 mg  Continues to have early am pre meds elevated BP readings  Acute kidney injury on CKD 3b/acute azotemia with mild hypernatremia- (present on admission) -Baseline renal function: BUN 18 and cr 1.49 -Recent cr 1.06 -Na stable-continue free water 250 cc q 4 hrs  Left scleral lesion/scleral hemorrhage    -Wife states dark pigmented areas in eyes have been there for multiple years without change -completed prednisone eye drops- on NS only    Protein-calorie malnutrition/dysphagia due to recent stroke Nutrition Problem: Inadequate oral intake Etiology: lethargy/confusion, dysphagia Signs/Symptoms: NPO status Interventions: Tube feeding via PEG Body mass index is 23.33 kg/m.     Transaminitis -2/2 Zyprexa -Resolved  Pneumonia due to Klebsiella pneumoniae [facility]) Resolved  Acute urinary retention -Continue  Urecholine -Foley catheter discontinued on 1/11 and patient voiding easily  MSSA (methicillin susceptible Staphylococcus aureus) pneumonia (HCC) Resolved  Pressure injury of  skin Resolved-prior anus ulcer  Pneumonia due to Serratia marcescens ([employer]) Resolved  Constipation Symptoms recurred again in the afternoon of 2/20 with x-ray continuing to demonstrate moderate stool burden in colon as well as rectal vault Started on twice daily lactulose 2/21 we will give one-time Fleet enema given stool retained in rectal vault        Subjective:  Opened eyes to voice-did not track or make direct eye contact when spoken to  Physical Exam: Vitals:   03/24/21 0315 03/24/21 0424 03/24/21 0720 03/24/21 0752  BP:  (!) 170/95  (!) 157/88  Pulse: (!) 109 100 (!) 106 (!) 109  Resp: (!) 22 19 16 16   Temp:  98.1 F (36.7 C)  99.9 F (37.7 C)  TempSrc:  Axillary  Oral  SpO2: 100% 99% 97% 99%  Weight:      Height:       Constitutional: No acute distress, unresponsive although now openeing eyes to voice more consistently Respiratory: #6.0 Shiley cuffless trach, room air- lung sounds CTA bilaterally,O2 sats between 100% -normal respiratory pattern  Cardiovascular: S1-S2, NSR, normotensive -no bilateral lower extremity edema  Abdomen:  Abdomen soft and nontender.  PEG tube -normoactive bowels -LBM 2/19 Neurologic: Upper extremities are flaccid to PROM.-No further hypertonicity of lower extremities elicited with PROM.  Psychiatric: Eyes are open.  Does not make eye contact or track    Data Reviewed: There are no new results to review at this time.  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  At this time disposition remains  in flux with TOC communicating on a regular basis with long-term care facilities as well as with the patient's primary insurance company.   A physical therapy consult is indicated based on the patients mobility assessment.   Mobility Assessment (last 72 hours)     Mobility Assessment     Row Name 03/23/21 2000 03/22/21 2000 03/22/21 0746   Does patient have an order for bedrest or is patient medically unstable Yes- Bedfast (Level 1) - Complete Yes- Bedfast (Level 1) - Complete Yes- Bedfast (Level 1) - Complete   What is the highest level of mobility based on the progressive mobility assessment? Level 1 (Bedfast) - Unable to balance while sitting on edge of bed Level 1 (Bedfast) - Unable to balance while sitting on edge of bed Level 1 (Bedfast) - Unable to balance while sitting on edge of bed   Is the above level different from baseline mobility prior to current illness? -- -- Yes - Recommend PT order            COVID vaccination status:  Unknown  Consultants: Neurology PCCM Medicine   Procedures: Echocardiogram EEG Cortrack PEG tube placement Tracheostomy tube placement   Planned Discharge Destination:  Home with Home Health     Time spent: 15 minutes  Author: Erin Hearing, NP 03/24/2021 10:42 AM  For on call review [email or web address].

## 2021-03-25 ENCOUNTER — Inpatient Hospital Stay (HOSPITAL_COMMUNITY): Payer: 59

## 2021-03-25 LAB — GLUCOSE, CAPILLARY
Glucose-Capillary: 165 mg/dL — ABNORMAL HIGH (ref 70–99)
Glucose-Capillary: 158 mg/dL — ABNORMAL HIGH (ref 70–99)
Glucose-Capillary: 165 mg/dL — ABNORMAL HIGH (ref 70–99)
Glucose-Capillary: 199 mg/dL — ABNORMAL HIGH (ref 70–99)
Glucose-Capillary: 178 mg/dL — ABNORMAL HIGH (ref 70–99)
Glucose-Capillary: 171 mg/dL — ABNORMAL HIGH (ref 70–99)

## 2021-03-25 MED ORDER — FLEET ENEMA 7-19 GM/118ML RE ENEM
1.0000 | ENEMA | Freq: Once | RECTAL | Status: AC
  Administered 2021-03-25: 1 via RECTAL

## 2021-03-25 MED ORDER — SORBITOL 70 % SOLN
960.0000 mL | TOPICAL_OIL | Freq: Once | ORAL | Status: AC
  Administered 2021-03-25: 960 mL via RECTAL
  Filled 2021-03-25: qty 473

## 2021-03-25 MED ORDER — BISACODYL 10 MG RE SUPP
10.0000 mg | Freq: Two times a day (BID) | RECTAL | Status: AC
  Administered 2021-03-25 – 2021-03-26 (×4): 10 mg via RECTAL
  Filled 2021-03-25 (×4): qty 1

## 2021-03-25 MED ORDER — GLUCERNA 1.5 CAL PO LIQD
1000.0000 mL | ORAL | Status: DC
  Administered 2021-03-25: 1000 mL
  Filled 2021-03-25 (×2): qty 1000

## 2021-03-25 NOTE — Progress Notes (Signed)
ATC setup changed 

## 2021-03-25 NOTE — Progress Notes (Signed)
CSW spoke with patient's wife [name] regarding discharge planning. [name] states she is hesitant to take the patient home but did not refuse. [name] states patient's brother will be returning from his vacation today and the family will discuss a potential home discharge.  Madilyn Fireman, MSW, LCSW Transitions of Care   Clinical Social Worker II [phone]

## 2021-03-25 NOTE — Progress Notes (Addendum)
Progress Note   Patient: Stephen Delgado [id number] DOB: July 22, 1965 DOA: 12/30/2020     85 DOS: the patient was seen and examined on 03/25/2021      Brief hospital course: 56 y.o. male with PMH significant for DM2, HTN, CKD with questionable compliance to medications Patient presented to the ED on 12/30/2020 with complaint of headache, altered mental status   In the ED, he was hypertensive to 238/160, agitated, required restraints CT head showed large amount of intraventricular hemorrhage involving lateral/third/fourth ventricle, ICH and left basal ganglia as well as chronic microvascular ischemic changes. He was started on Cleviprex drip and admitted to neuro ICU   He subsequently had CT head repeated on 11/30, 12/1 and 12/3, all demonstrated unchanged intraparenchymal hemorrhage and intraventricular hemorrhage 12/3, MRI brain from also showed numerous small acute infarcts in both cerebral hemisphere with minimal improvement in the right superior cerebellum 12/3, intubated 12/13, tracheostomy 12/15, wean from vent to trach collar 12/16, PEG tube placement 12/16, repeat MRI showed an increase in the size and number of acute infarcts in the bilateral hemispheres with increased involvement in the cerebellum bilaterally.   12/18, transferred from neuro service to [facility]  12/21, palliative care consulted.  Family wanted to continue aggressive care as full CODE STATUS. 12/23 developed Cheyne-Stokes respiratory pattern with respiratory alkalosis.  Transferred to ICU for short-term mechanical ventilation and was found to have new tracheobronchitis now on cefepime 12/25 back to progressive floor and to [facility]   His hospital course has been complicated by significant neurological impairment, aspiration events, AKI, hypernatremia, urinary retention. 1/9 trach changed to #6.0 Shiley cuffless--1/10 > 30 days since trach inserted.  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 Norvasc to 10 mg  Continues to have early am pre meds elevated BP readings  Acute kidney injury on CKD 3b/acute azotemia with mild hypernatremia- (present on admission) -Baseline renal function: BUN 18 and cr 1.49 -Recent cr 1.06 -Na stable-continue free water 250 cc q 4 hrs  Left scleral lesion/scleral hemorrhage    -Wife states dark pigmented areas in eyes have been there for multiple years without change -completed prednisone eye drops- on NS only    Protein-calorie malnutrition/dysphagia due to recent stroke/constipation Nutrition Problem: Inadequate oral intake Etiology: lethargy/confusion, dysphagia Signs/Symptoms: NPO status Interventions: Tube feeding via PEG Body mass index is 23.33 kg/m.  Patient was having emesis with bolus  tube feedings.  At one point this was directly related to tube feeding being pushed bolused instead of allowed to infuse  by gravity.  Patient later had an episode of emesis after a true gravity feed that was precipitated by coughing episode. Abdominal x-ray showed continued constipation including rectal impaction. To improve tolerance of tube feedings we will hold on bolus feeds for now and resume continuous Glucerna but at a much lower rate of 25 cc/h Continue lactulose.  Was given Fleet enema on 2/21 with results-plan is to change to smog enema x1 today We will check KUB in a.m. to see if improvement-no improvement will need bowel prep from above    Transaminitis -2/2 Zyprexa -Resolved  Pneumonia due to Klebsiella pneumoniae [facility]) Resolved  Acute urinary retention -Continue Urecholine -Foley catheter discontinued on 1/11 and patient voiding easily  MSSA (methicillin susceptible Staphylococcus aureus) pneumonia (HCC) Resolved  Pressure injury of skin Resolved-prior anus ulcer  Pneumonia due to Serratia marcescens ([place]) Resolved  Constipation Symptoms recurred again in the afternoon of 2/20 with x-ray continuing to demonstrate moderate stool burden in colon as well as rectal vault Started on twice daily lactulose 2/21 we will give one-time Fleet enema given stool retained in rectal vault 2/21 around 1 pm had emesis-continue to monitor        Subjective:  Patient's eyes were open upon my entry into the room.  I spoke to the patient as well as stimulated him and his eye position did not change/he did not track  Physical Exam: Vitals:   03/25/21 0814 03/25/21 1113 03/25/21 1207 03/25/21 1428  BP:  (!) 151/86  (!) 150/88  Pulse: 99 100 100   Resp: 20 16 18    Temp:  98.2 F (36.8 C)    TempSrc:  Oral    SpO2: 99% 98% 99%   Weight:      Height:       Constitutional: No acute distress, unresponsive although now openeing eyes to voice more consistently Respiratory:  #6.0 Shiley cuffless trach, room air- lung sounds CTA bilaterally although more coarse and slightly rhonchorous on the right,O2 sats between 100% -normal respiratory pattern  Cardiovascular: S1-S2, NSR, normotensive -no bilateral lower extremity edema  Abdomen:  Abdomen soft and nontender.  PEG tube -normoactive bowels -LBM 2/21 Neurologic: Upper extremities are flaccid to PROM.-No further hypertonicity of lower extremities elicited with PROM.  Psychiatric: Eyes are open.  Does not make eye contact or track    Data Reviewed: There are no new results to review at this time.  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  At this time disposition remains in flux with TOC communicating on a regular basis with long-term care facilities as well as with the patient's primary insurance company.   A physical therapy consult is indicated based on the patients mobility assessment.   Mobility Assessment (last 72 hours)     Mobility Assessment     Row Name 03/23/21 2000 03/22/21 2000     Does patient have an order for bedrest or is patient medically unstable Yes- Bedfast (Level 1) - Complete Yes- Bedfast (Level 1) - Complete    What is the highest level of mobility based on the progressive mobility assessment? Level 1 (Bedfast) - Unable to balance while sitting on edge of bed Level 1 (Bedfast) - Unable to balance while sitting on edge of bed  COVID vaccination status:  Unknown  Consultants: Neurology PCCM Medicine   Procedures: Echocardiogram EEG Cortrack PEG tube placement Tracheostomy tube placement   Planned Discharge Destination:  Home with Home Health     Time spent: 15  minutes  Author: Erin Hearing, NP 03/25/2021 2:53 PM  For on call review [email or web address].

## 2021-03-25 NOTE — Progress Notes (Addendum)
Nutrition Follow-up  DOCUMENTATION CODES:   Severe malnutrition in context of chronic illness  INTERVENTION:  Continue bolus TF via PEG: -Provide 2 cartons (424m) Glucerna 1.5 TID, flush with 556m free water before and after each bolus -Provide an additional 25015mree water QID to meet fluid needs   Bolus TF regimen will provide 2136 kcals, 117 grams protein, 1080m32mee water (2380ml56mal free water with flushes)  Recommend continue 1 packet Juven BID, each packet provides 95 calories, 2.5 grams of protein (collagen), and 9.8 grams of carbohydrate (3 grams sugar); also contains 7 grams of L-arginine and L-glutamine, 300 mg vitamin C, 15 mg vitamin E, 1.2 mcg vitamin B-12, 9.5 mg zinc, 200 mg calcium, and 1.5 g  Calcium Beta-hydroxy-Beta-methylbutyrate to support wound healing  Attempted to educate wife on home tube feeding administration, but wife was not in room. RD left education material in room and will follow-up with wife for education.   NUTRITION DIAGNOSIS:   Severe Malnutrition related to chronic illness as evidenced by severe muscle depletion, severe fat depletion.  ongoing  GOAL:   Patient will meet greater than or equal to 90% of their needs  Met with TF  MONITOR:   TF tolerance, Labs, I & O's, Skin  REASON FOR ASSESSMENT:   Ventilator, Consult Enteral/tube feeding initiation and management  ASSESSMENT:   55 ye68 old male who presented to the ED on 11/29 with AMS. PMH of HTN, T2DM, CKD stage III. 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  11/30 cortrak placed; tip gastric 12/2 trickle TF 12/3 pt intubated due to increased WOB, +emesis, TF held 12/4 trickle TF restarted 12/5 advancement orders placed 12/9 TF adjusted 12/13 s/p tracheostomy 12/16 s/p EGD and PEG placement 12/19 tx out of ICU to [facility]  [facility]23 developed Cheyne-Stokes respiratory pattern with respiratory alkalosis.Transferred to ICU  for short-term mechanical ventilation and was found to have new tracheobronchitis now on cefepime 12/25 tx back to [facility] 1[facility] trach changed to #6 shiley cuffless  1/10 marks 30 days since trach placed 2/17 trach changed    Pt continues to be minimally responsive and tolerating ATC per CCM. Also continues to require TF via PEG. Current TF: Provide 2 cartons (474ml)55mcerna 1.5 TID, flush with 50mls 75m water before and after each bolus with an additional 250ml fr67mater QID to meet fluid needs. Note 2 episodes of emesis documented since last RD visit. After discussing with RN and with NP, suspect emesis is 2/2 boluses being administered too quickly as RN today denies any issues with emesis. Pt's abdomen is noted to be distended which is suspected to be due to stool burden. Pt had enema yesterday with type 3 BM. Pt is to receive another enema today. RD to monitor for TF tolerance and will continue to attempt to educate pt's wife on home TF administration.    UOP: 350ml x2440mrs I/O: -4600ml sinc62mmit   Medications: SSI Q4H, 24 units levemir BID, chronulac, juven BID, protonix  Labs reviewed.  CBGs: 155-183 x 24 hours  Nutrition-Focused Physical Exam Flowsheet Row Most Recent Value  Orbital Region Moderate depletion  Upper Arm Region Severe depletion  Thoracic and Lumbar Region Severe depletion  Buccal Region Severe depletion  Temple Region Moderate depletion  Clavicle Bone Region Severe depletion  Clavicle and Acromion Bone Region Severe depletion  Scapular Bone Region Severe depletion  Dorsal Hand Moderate depletion  Patellar Region Severe depletion  Anterior Thigh Region Severe depletion  Posterior Calf Region Severe depletion  Edema (RD Assessment) Mild  Hair Reviewed  Eyes Unable to assess  Mouth Unable to assess  Skin Reviewed  Nails Reviewed        Diet Order:   Diet Order             Diet NPO time specified  Diet effective midnight                    EDUCATION NEEDS:   Not appropriate for education at this time  Skin:  Skin Assessment: Reviewed RN Assessment Skin Integrity Issues:: Stage II Stage II: anus  Last BM:  2/21 type 3  Height:   Ht Readings from Last 1 Encounters:  01/23/21 6' (1.829 m)    Weight:   Wt Readings from Last 1 Encounters:  03/16/21 83.5 kg    BMI:  Body mass index is 24.95 kg/m.  Estimated Nutritional Needs:   Kcal:  2150-2350  Protein:  105-120 grams  Fluid:  >2L     [name]., MS, RD, LDN (she/her/hers) RD pager number and weekend/on-call pager number located in [place].

## 2021-03-26 ENCOUNTER — Inpatient Hospital Stay (HOSPITAL_COMMUNITY): Payer: 59

## 2021-03-26 DIAGNOSIS — E43 Unspecified severe protein-calorie malnutrition: Secondary | ICD-10-CM | POA: Insufficient documentation

## 2021-03-26 HISTORY — DX: Unspecified severe protein-calorie malnutrition: E43

## 2021-03-26 LAB — GLUCOSE, CAPILLARY
Glucose-Capillary: 171 mg/dL — ABNORMAL HIGH (ref 70–99)
Glucose-Capillary: 155 mg/dL — ABNORMAL HIGH (ref 70–99)
Glucose-Capillary: 163 mg/dL — ABNORMAL HIGH (ref 70–99)
Glucose-Capillary: 188 mg/dL — ABNORMAL HIGH (ref 70–99)
Glucose-Capillary: 146 mg/dL — ABNORMAL HIGH (ref 70–99)

## 2021-03-26 MED ORDER — POLYETHYLENE GLYCOL 3350 17 GM/SCOOP PO POWD
1.0000 | Freq: Once | ORAL | Status: AC
  Administered 2021-03-26: 255 g
  Filled 2021-03-26: qty 255

## 2021-03-26 NOTE — Plan of Care (Signed)
  Problem: Clinical Measurements: Goal: Respiratory complications will improve Outcome: Progressing Goal: Cardiovascular complication will be avoided Outcome: Progressing   Problem: Activity: Goal: Risk for activity intolerance will decrease Outcome: Progressing   Problem: Nutrition: Goal: Adequate nutrition will be maintained Outcome: Progressing   

## 2021-03-26 NOTE — Progress Notes (Signed)
Nutrition Follow-up  DOCUMENTATION CODES:  Severe malnutrition in context of chronic illness  INTERVENTION:  Continue TF via PEG: -Glucerna 1.5 @ 25m/hr  -Recommend advancing TF by 157mhr Q8H until goal rate of 6048mr (1440m52my) is reached -Free water per MD/NP, currently 250ml69me water QID  At goal, TF regimen will provide 2160 kcals, 118 grams protein, 1092ml 9m water (2092ml t41m free water with flushes)  Recommend continue 1 packet Juven BID, each packet provides 95 calories, 2.5 grams of protein (collagen), and 9.8 grams of carbohydrate (3 grams sugar); also contains 7 grams of L-arginine and L-glutamine, 300 mg vitamin C, 15 mg vitamin E, 1.2 mcg vitamin B-12, 9.5 mg zinc, 200 mg calcium, and 1.5 g  Calcium Beta-hydroxy-Beta-methylbutyrate to support wound healing  NUTRITION DIAGNOSIS:  Severe Malnutrition related to chronic illness as evidenced by severe muscle depletion, severe fat depletion. -- ongoing  GOAL:  Patient will meet greater than or equal to 90% of their needs -- addressing with TF, met when TF at goal  MONITOR:  TF tolerance, Labs, I & O's, Skin  REASON FOR ASSESSMENT:  Ventilator, Consult Enteral/tube feeding initiation and management  ASSESSMENT:  55 year16ld male who presented to the ED on 11/29 with AMS. PMH of HTN, T2DM, CKD stage III. 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  11/30 cortrak placed; tip gastric 12/2 trickle TF 12/3 pt intubated due to increased WOB, +emesis, TF held 12/4 trickle TF restarted 12/5 advancement orders placed 12/9 TF adjusted 12/13 s/p tracheostomy 12/16 s/p EGD and PEG placement 12/19 tx out of ICU to [facility]  12[facility] developed Cheyne-Stokes respiratory pattern with respiratory alkalosis.Transferred to ICU for short-term mechanical ventilation and was found to have new tracheobronchitis now on cefepime 12/[facility]  1/10 marks 30 days since trach placed 2/17 trach changed    Pt continues to be minimally responsive, opened eyes briefly to RD voice but did not track movement. Pt remains on ATC and continues to be NPO requiring TF via PEG. Pt had been receiving 474ml Gl4mna 1.5 TID via PEG without issue until 2 days ago when it was documented that pt began having episodes of emesis after bolus TF administered. Yesterday morning, RD discussed with RN and NP in detail and felt that emesis was 2/2 boluses being administered too quickly and RN was educated on how to properly provide bolus feedings (infusing to gravity). After lunch, RN reached out to inform MD, NP, and RD that pt continued to have emesis with boluses. Repeat KUB showed constipation including rectal impaction despite administration of fleet enema on 2/21. Additional smog enema was administered yesterday, 2/22, and NP adjusted pt's TF regimen to continuous trickle feeds. Pt also receiving lactulose. Repeat KUB this morning revealed large amount of stool remaining throughout the colon. Per NP, plan is to initiate bowel regimen. Will discuss titration of tube feeding with NP and provide recommended goal rate.   Current TF regimen: Glucerna 1.5 @ 25ml/hr 30m 250ml free9mer QID   UOP: 900ml x24 h70m I/O: -5500ml since 66mt   Medications: dulcolax, SSI Q4H, 24 units levemir BID, chronulac, miralax, juven BID, protonix  Labs reviewed.  CBGs: 155-199 x 24 hours  Diet Order:   Diet Order             Diet NPO time specified  Diet effective midnight  EDUCATION NEEDS:  Not appropriate for education at this time  Skin:  Skin Assessment: Reviewed RN Assessment Skin Integrity Issues:: Stage II Stage II: anus  Last BM:  2/22  Height:  Ht Readings from Last 1 Encounters:  01/23/21 6' (1.829 m)   Weight:  Wt Readings from Last 1 Encounters:  03/16/21 83.5 kg   BMI:  Body mass index is 24.95 kg/m.  Estimated  Nutritional Needs:  Kcal:  2150-2350 Protein:  105-120 grams Fluid:  >2L   [name]., MS, RD, LDN (she/her/hers) RD pager number and weekend/on-call pager number located in [place].

## 2021-03-26 NOTE — Progress Notes (Signed)
Progress Note   Patient: Stephen Delgado [id number] DOB: [birth year] DOA: 12/30/2020     86 DOS: the patient was seen and examined on 03/26/2021      Brief hospital course: 56 y.o. male with PMH significant for DM2, HTN, CKD with questionable compliance to medications Patient presented to the ED on 12/30/2020 with complaint of headache, altered mental status   In the ED, he was hypertensive to 238/160, agitated, required restraints CT head showed large amount of intraventricular hemorrhage involving lateral/third/fourth ventricle, ICH and left basal ganglia as well as chronic microvascular ischemic changes. He was started on Cleviprex drip and admitted to neuro ICU   He subsequently had CT head repeated on 11/30, 12/1 and 12/3, all demonstrated unchanged intraparenchymal hemorrhage and intraventricular hemorrhage 12/3, MRI brain from also showed numerous small acute infarcts in both cerebral hemisphere with minimal improvement in the right superior cerebellum 12/3, intubated 12/13, tracheostomy 12/15, wean from vent to trach collar 12/16, PEG tube placement 12/16, repeat MRI showed an increase in the size and number of acute infarcts in the bilateral hemispheres with increased involvement in the cerebellum bilaterally.   12/18, transferred from neuro service to [facility]  12/21, palliative care consulted.  Family wanted to continue aggressive care as full CODE STATUS. 12/23 developed Cheyne-Stokes respiratory pattern with respiratory alkalosis.  Transferred to ICU for short-term mechanical ventilation and was found to have new tracheobronchitis now on cefepime 12/25 back to progressive floor and to [facility]   His hospital course has been complicated by significant neurological impairment, aspiration events, AKI, hypernatremia, urinary retention. 1/9 trach changed to #6.0 Shiley cuffless--1/10 > 30 days since trach inserted.  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 Norvasc to 10 mg  Continues to have early am pre meds elevated BP readings  Acute kidney injury on CKD 3b/acute azotemia with mild hypernatremia- (present on admission) -Baseline renal function: BUN 18 and cr 1.49 -Recent cr 1.06 -Na stable-continue free water 250 cc q 4 hrs  Left scleral lesion/scleral hemorrhage    -Wife states dark pigmented areas in eyes have been there for multiple years without change -completed prednisone eye drops- on NS only    Protein-calorie malnutrition/dysphagia due to recent stroke/constipation Nutrition Problem: Inadequate oral intake Etiology: lethargy/confusion, dysphagia Signs/Symptoms: NPO status Interventions: Tube feeding via PEG Body mass index is 23.33 kg/m.  Continue Glucern 25 cc/h until severe constipation  resolved Continue lactulose.  X-ray 2/23 reveals resolution of rectal stool impaction but continues to have a significant amount of retained stool  in the colon.  We will continue usual laxatives but will hold scheduled MiraLAX in favor of a MiraLAX bowel prep.    Transaminitis -2/2 Zyprexa -Resolved  Pneumonia due to Klebsiella pneumoniae [facility]) Resolved  Acute urinary retention -Continue Urecholine -Foley catheter discontinued on 1/11 and patient voiding easily  MSSA (methicillin susceptible Staphylococcus aureus) pneumonia (HCC) Resolved  Pressure injury of skin Resolved-prior anus ulcer  Pneumonia due to Serratia marcescens ([place]) Resolved  Constipation Symptoms recurred again in the afternoon of 2/20 with x-ray continuing to demonstrate moderate stool burden in colon as well as rectal vault Started on twice daily lactulose 2/21 we will give one-time Fleet enema given stool retained in rectal vault 2/21 around 1 pm had emesis-continue to monitor        Subjective:  Eyes open but remains unresponsive.  Physical Exam: Vitals:   03/26/21 0302 03/26/21 0334 03/26/21 0727 03/26/21 0806  BP:  (!) 149/82 (!) 154/81   Pulse: 99 100 (!) 102 (!) 103  Resp: 16 16 16 18   Temp:  (!) 97.5 F (36.4 C) 100 F (37.8 C)   TempSrc:  Axillary Axillary   SpO2: 98% 100% 98% 99%  Weight:      Height:       Constitutional: No acute distress, unresponsive with open Respiratory: #6.0 Shiley cuffless trach, room air- lung sounds CTA bilaterally although more coarse and slightly rhonchorous on the right,O2 sats between 100% -normal respiratory pattern  Cardiovascular: S1-S2, NSR, normotensive -no bilateral lower extremity edema  Abdomen:  Abdomen soft and nontender.  PEG tube -normoactive bowels -LBM 2/22 Neurologic: Upper extremities are flaccid to PROM.-No further hypertonicity of lower extremities elicited with PROM.  Psychiatric: Eyes are open.  Does not make eye contact or  track    Data Reviewed: There are no new results to review at this time.  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  At this time disposition remains in flux with TOC communicating on a regular basis with long-term care facilities as well as with the patient's primary insurance company.   A physical therapy consult is indicated based on the patients mobility assessment.   Mobility Assessment (last 72 hours)     Mobility Assessment     Row Name 03/25/21 [phone] 03/23/21 2000     Does patient have an order for bedrest or is patient medically unstable Yes- Bedfast (Level 1) - Complete Yes- Bedfast (Level 1) - Complete    What is the highest level of mobility based on the progressive mobility assessment? Level 1 (Bedfast) - Unable to balance while sitting on edge of bed Level 1 (Bedfast) - Unable to balance while sitting on edge of bed    Is the above level different from baseline mobility prior to current illness? Yes - Recommend PT order --             COVID vaccination status:  Unknown  Consultants: Neurology PCCM Medicine   Procedures: Echocardiogram EEG Cortrack PEG tube placement Tracheostomy tube placement   Planned Discharge Destination:  Home with Home Health     Time spent: 15 minutes  Author: Erin Hearing, NP 03/26/2021 9:59 AM  For on call review [email or web address].

## 2021-03-27 LAB — GLUCOSE, CAPILLARY
Glucose-Capillary: 134 mg/dL — ABNORMAL HIGH (ref 70–99)
Glucose-Capillary: 143 mg/dL — ABNORMAL HIGH (ref 70–99)
Glucose-Capillary: 159 mg/dL — ABNORMAL HIGH (ref 70–99)
Glucose-Capillary: 161 mg/dL — ABNORMAL HIGH (ref 70–99)
Glucose-Capillary: 182 mg/dL — ABNORMAL HIGH (ref 70–99)
Glucose-Capillary: 182 mg/dL — ABNORMAL HIGH (ref 70–99)

## 2021-03-27 MED ORDER — GLUCERNA 1.5 CAL PO LIQD
1000.0000 mL | ORAL | Status: DC
  Administered 2021-03-27 – 2021-04-09 (×14): 1000 mL
  Filled 2021-03-27 (×21): qty 1000

## 2021-03-27 NOTE — Progress Notes (Signed)
Progress Note   Patient: Stephen Delgado [id number]:[id number] DOB: 1965/10/27 DOA: 12/30/2020     87 DOS: the patient was seen and examined on 03/27/2021      Brief hospital course: 56 y.o. male with PMH significant for DM2, HTN, CKD with questionable compliance to medications Patient presented to the ED on 12/30/2020 with complaint of headache, altered mental status   In the ED, he was hypertensive to 238/160, agitated, required restraints CT head showed large amount of intraventricular hemorrhage involving lateral/third/fourth ventricle, ICH and left basal ganglia as well as chronic microvascular ischemic changes. He was started on Cleviprex drip and admitted to neuro ICU   He subsequently had CT head repeated on 11/30, 12/1 and 12/3, all demonstrated unchanged intraparenchymal hemorrhage and intraventricular hemorrhage 12/3, MRI brain from also showed numerous small acute infarcts in both cerebral hemisphere with minimal improvement in the right superior cerebellum 12/3, intubated 12/13, tracheostomy 12/15, wean from vent to trach collar 12/16, PEG tube placement 12/16, repeat MRI showed an increase in the size and number of acute infarcts in the bilateral hemispheres with increased involvement in the cerebellum bilaterally.   [facility]18[facility]  12/21, palliative care consulted.  Family wanted to continue aggressive care as full CODE STATUS. 12/23 developed Cheyne-Stokes respiratory pattern with respiratory alkalosis.  Transferred to ICU for short-term mechanical ventilation and was found to have new tracheobronchitis now on cefepime 12/25 [facility]   His hospital course has been complicated by significant neurological impairment, aspiration events, AKI, hypernatremia, urinary retention. 1/9 trach changed to #6.0 Shiley cuffless--1/10 > 30 days since trach inserted.  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   -No real progress per OT and they have signed off -Due to lack of recovery and no clinical hope of full recovery the concept of futility of care was introduced by the multidisciplinary team.  After discussion with my attending physician as well as members of the team and after consultation with ethics committee representative Dr. Lyn HollingsheadAlexander it was determined that the patient did not meet criteria for futility of care.  Patient is medically stable and has life-sustaining treatments but is not on life support.  The wife who is the Education administratorpatient's director of care as well as multiple members of the family have clarified that the patient would want everything done and that they are honoring those wishes and even if he lives what is left of his life and the current condition they are happy with the circumstances.  Dr. 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 Norvasc to 10 mg  Continues to have early am pre meds elevated BP readings  Acute kidney injury on CKD 3b/acute azotemia with mild hypernatremia- (present on admission) -Baseline renal function: BUN 18 and cr 1.49 -Recent cr 1.06 -Na stable-continue free water 250 cc q 4 hrs  Left scleral lesion/scleral hemorrhage    -Wife states dark pigmented areas in eyes have been there for multiple years without change -completed prednisone eye drops- on NS only    Protein-calorie malnutrition/dysphagia due to recent stroke/constipation Nutrition Problem: Inadequate oral intake Etiology: lethargy/confusion, dysphagia Signs/Symptoms: NPO status Interventions: Tube feeding via PEG Body mass index is 23.33 kg/m.  Continue Glucern 25 cc/h until severe constipation resolved Continue lactulose.  X-ray 2/23 reveals resolution of rectal stool impaction but continues to have a significant amount of retained stool in the colon.  We will continue usual laxatives but will hold scheduled MiraLAX in favor of a MiraLAX bowel prep. 2/24 at least 6 BMs since bowel prep-abd softer- increase TF back to 60.hr    Transaminitis -2/2 Zyprexa -Resolved  Pneumonia due to Klebsiella pneumoniae [facility]) Resolved  Acute urinary retention -Continue Urecholine -Foley catheter discontinued on 1/11 and patient voiding easily  MSSA (methicillin susceptible Staphylococcus aureus) pneumonia (HCC) Resolved  Pressure injury of skin Resolved-prior anus ulcer  Pneumonia due to Serratia marcescens ([facility]) Resolved  Constipation Symptoms recurred again in the afternoon of 2/20 with x-ray continuing to demonstrate moderate stool burden in colon as well as rectal vault Started on twice daily lactulose 2/21 we will give one-time Fleet enema given stool retained in rectal vault 2/21 around 1 pm had emesis-continue to monitor        Subjective:  Eyes are open but  unresponsive and not tracking  Physical Exam: Vitals:   03/27/21 0728 03/27/21 0736 03/27/21 1125 03/27/21 1143  BP:  (!) 144/95  (!) 152/88  Pulse: (!) 101 97 94 94  Resp: 18 20 18 17   Temp:  98.9 F (37.2 C)  99.7 F (37.6 C)  TempSrc:  Axillary  Oral  SpO2: 97% 100% 95% 95%  Weight:      Height:       Constitutional: No acute distress, unresponsive with open Respiratory: #6.0 Shiley cuffless trach, room air- lung sounds CTA bilaterally although more coarse and slightly rhonchorous on the right,O2 sats between 100% -normal respiratory pattern  Cardiovascular: S1-S2, NSR, normotensive -no bilateral lower extremity edema  Abdomen:  Abdomen soft and nontender.  PEG tube -normoactive bowels -LBM 2/22-RN reports at least 6 BMs since bowel prep intiated Neurologic: Upper extremities are flaccid to PROM.-No further hypertonicity of lower extremities elicited with PROM.  Psychiatric: Eyes are open.  Does not make eye contact or track    Data Reviewed: There are no new results to review at this time.  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  At this time disposition remains in flux with TOC communicating on a regular basis with long-term care facilities as well as with the patient's primary insurance company.   A physical therapy consult is indicated based on the patients mobility assessment.   Mobility Assessment (most recent)     Mobility Assessment - 03/26/21 1935     Does patient have an order for bedrest or  is patient medically unstable Yes- Bedfast (Level 1) - Complete  What is the highest level of mobility based on the progressive mobility assessment? Level 1 (Bedfast) - Unable to balance while sitting on  edge of bed    Is the above level different from baseline mobility prior to current illness? Yes - Recommend PT order               COVID vaccination status:  Unknown  Consultants: Neurology PCCM Medicine   Procedures: Echocardiogram EEG Cortrack PEG tube placement Tracheostomy tube placement   Planned Discharge Destination:  Home with Home Health     Time spent: 15 minutes  Author: Erin Hearing, NP 03/27/2021 12:11 PM  For on call review [email or web address].

## 2021-03-27 NOTE — Progress Notes (Signed)
ETHICS CONSULT - BRIEF  Spoke yesterday with Erin Hearing NP - reviewed ethical principles of medical futility as applies to this patients case. Per her report, this has been a difficult placement issue and insurance payor has brought up stopping treatment, question of futility. Per Ms Lissa Merlin, patient is stable and his QOL is acceptable per his appropriate legal decision maker, who seems to be acting with best of intent and in ac cordance with what the patient would want for himself. This does not appear to be a case of medical futility. ONLY the attending physician in cooperation with other appropriate members of the treatment team may determine medical futility - insurance NEVER has this ability.   Please do not hesitate to reach out to Ethics team as needed for further discussion! Check AMION for on call committee member.

## 2021-03-27 NOTE — Progress Notes (Signed)
[facility]) Hospital Liaison note:  New patient of [place] services. Will continue to follow for disposition.  Please call with any outpatient palliative questions or concerns.  Thank you for the opportunity to participate in this patient's care.  Thank you, Lorelee Market, LPN [facility] Liaison [phone]

## 2021-03-27 NOTE — Progress Notes (Signed)
CSW spoke with Lupita Leash from [facility] who states patient has zero eligibility for personal care services. CSW and Lupita Leash discussed the request yesterday for an Ethics consult that was denied. CSW will update Lupita Leash as updates become available.  [name], MSW, LCSW Transitions of Care   Clinical Social Worker II [phone]

## 2021-03-27 NOTE — Plan of Care (Signed)
°  Problem: Clinical Measurements: Goal: Will remain free from infection Outcome: Progressing   Problem: Nutrition: Goal: Adequate nutrition will be maintained Outcome: Progressing   Problem: Skin Integrity: Goal: Risk for impaired skin integrity will decrease Outcome: Progressing   Problem: Nutrition: Goal: Risk of aspiration will decrease Outcome: Progressing Goal: Dietary intake will improve Outcome: Progressing

## 2021-03-28 DIAGNOSIS — E870 Hyperosmolality and hypernatremia: Secondary | ICD-10-CM | POA: Diagnosis not present

## 2021-03-28 DIAGNOSIS — R197 Diarrhea, unspecified: Secondary | ICD-10-CM

## 2021-03-28 DIAGNOSIS — K5900 Constipation, unspecified: Secondary | ICD-10-CM

## 2021-03-28 LAB — CBC WITH DIFFERENTIAL/PLATELET
Abs Immature Granulocytes: 0.04 10*3/uL (ref 0.00–0.07)
Basophils Absolute: 0 10*3/uL (ref 0.0–0.1)
Basophils Relative: 0 %
Eosinophils Absolute: 0.2 10*3/uL (ref 0.0–0.5)
Eosinophils Relative: 2 %
HCT: 35 % — ABNORMAL LOW (ref 39.0–52.0)
Hemoglobin: 10.2 g/dL — ABNORMAL LOW (ref 13.0–17.0)
Immature Granulocytes: 0 %
Lymphocytes Relative: 15 %
Lymphs Abs: 1.8 10*3/uL (ref 0.7–4.0)
MCH: 23.7 pg — ABNORMAL LOW (ref 26.0–34.0)
MCHC: 29.1 g/dL — ABNORMAL LOW (ref 30.0–36.0)
MCV: 81.2 fL (ref 80.0–100.0)
Monocytes Absolute: 0.6 10*3/uL (ref 0.1–1.0)
Monocytes Relative: 5 %
Neutro Abs: 9.5 10*3/uL — ABNORMAL HIGH (ref 1.7–7.7)
Neutrophils Relative %: 78 %
Platelets: 234 10*3/uL (ref 150–400)
RBC: 4.31 MIL/uL (ref 4.22–5.81)
RDW: 17.9 % — ABNORMAL HIGH (ref 11.5–15.5)
WBC: 12.1 10*3/uL — ABNORMAL HIGH (ref 4.0–10.5)
nRBC: 0 % (ref 0.0–0.2)

## 2021-03-28 LAB — COMPREHENSIVE METABOLIC PANEL WITH GFR
ALT: 95 U/L — ABNORMAL HIGH (ref 0–44)
AST: 53 U/L — ABNORMAL HIGH (ref 15–41)
Albumin: 2.5 g/dL — ABNORMAL LOW (ref 3.5–5.0)
Alkaline Phosphatase: 69 U/L (ref 38–126)
Anion gap: 13 (ref 5–15)
BUN: 54 mg/dL — ABNORMAL HIGH (ref 6–20)
CO2: 26 mmol/L (ref 22–32)
Calcium: 9.8 mg/dL (ref 8.9–10.3)
Chloride: 112 mmol/L — ABNORMAL HIGH (ref 98–111)
Creatinine, Ser: 1.12 mg/dL (ref 0.61–1.24)
GFR, Estimated: >60 mL/min
Glucose, Bld: 207 mg/dL — ABNORMAL HIGH (ref 70–99)
Potassium: 4 mmol/L (ref 3.5–5.1)
Sodium: 151 mmol/L — ABNORMAL HIGH (ref 135–145)
Total Bilirubin: 0.7 mg/dL (ref 0.3–1.2)
Total Protein: 6.5 g/dL (ref 6.5–8.1)

## 2021-03-28 LAB — GLUCOSE, CAPILLARY
Glucose-Capillary: 165 mg/dL — ABNORMAL HIGH (ref 70–99)
Glucose-Capillary: 166 mg/dL — ABNORMAL HIGH (ref 70–99)
Glucose-Capillary: 168 mg/dL — ABNORMAL HIGH (ref 70–99)
Glucose-Capillary: 173 mg/dL — ABNORMAL HIGH (ref 70–99)
Glucose-Capillary: 179 mg/dL — ABNORMAL HIGH (ref 70–99)
Glucose-Capillary: 181 mg/dL — ABNORMAL HIGH (ref 70–99)
Glucose-Capillary: 181 mg/dL — ABNORMAL HIGH (ref 70–99)

## 2021-03-28 LAB — PREALBUMIN: Prealbumin: 26.1 mg/dL (ref 18–38)

## 2021-03-28 MED ORDER — FREE WATER
250.0000 mL | Freq: Four times a day (QID) | Status: DC
  Administered 2021-03-28: 250 mL

## 2021-03-28 MED ORDER — FREE WATER
250.0000 mL | Status: DC
  Administered 2021-03-28 – 2021-03-29 (×5): 250 mL

## 2021-03-28 NOTE — Assessment & Plan Note (Deleted)
Had resolved but after frequency of free H2O boluses decreased Na back up to 146 w/ concurrent mild elevation in BUN 3/3 Increase free H2O to q 2 hrs

## 2021-03-28 NOTE — Progress Notes (Addendum)
Progress Note   Patient: Stephen Delgado [id number]:[id number] DOB: January 23, 1966 DOA: 12/30/2020     89 DOS: the patient was seen and examined on 03/29/2021      Brief hospital course: 56 y.o. male with PMH significant for DM2, HTN, CKD with questionable compliance to medications Patient presented to the ED on 12/30/2020 with complaint of headache, altered mental status   In the ED, he was hypertensive to 238/160, agitated, required restraints CT head showed large amount of intraventricular hemorrhage involving lateral/third/fourth ventricle, ICH and left basal ganglia as well as chronic microvascular ischemic changes. He was started on Cleviprex drip and admitted to neuro ICU   He subsequently had CT head repeated on 11/30, 12/1 and 12/3, all demonstrated unchanged intraparenchymal hemorrhage and intraventricular hemorrhage 12/3, MRI brain from also showed numerous small acute infarcts in both cerebral hemisphere with minimal improvement in the right superior cerebellum 12/3, intubated 12/13, tracheostomy 12/15, wean from vent to trach collar 12/16, PEG tube placement 12/16, repeat MRI showed an increase in the size and number of acute infarcts in the bilateral hemispheres with increased involvement in the cerebellum bilaterally.   12/18, transferred from neuro service to [facility]  12/21, palliative care consulted.  Family wanted to continue aggressive care as full CODE STATUS. 12/23 developed Cheyne-Stokes respiratory pattern with respiratory alkalosis.  Transferred to ICU for short-term mechanical ventilation and was found to have new tracheobronchitis now on cefepime 12/25 back to progressive floor and to [facility]   His hospital course has been complicated by significant neurological impairment, aspiration events, AKI, hypernatremia, urinary retention. 1/9 trach changed to #6.0 Shiley cuffless--1/10 > 30 days since trach inserted.  Assessment and Plan: Hypernatremia Sodium still increasing, it is  153 this morning despite increasing his free water via PEG, I will go ahead and increase it further to 250 cc every 2 hours, will monitor BMP in a.m., and if remains elevated then will start on D5W. -I did discontinue all his laxatives as he has been having diarrhea.  Constipation Constipation has resolved, patient currently with diarrhea, lactulose discontinued  Left scleral lesion/scleral hemorrhage    -Wife states dark pigmented areas in eyes have been there for multiple years without change -completed prednisone eye drops- on NS only    Protein-calorie malnutrition/dysphagia due to recent stroke/constipation Nutrition Problem: Inadequate oral intake Etiology: lethargy/confusion, dysphagia Signs/Symptoms: NPO status Interventions: Tube feeding via PEG Body mass index is 23.33 kg/m.  Continue Glucern 25 cc/h until severe constipation resolved Continue lactulose.  X-ray 2/23 reveals resolution of rectal stool impaction but continues to have a significant amount of retained stool in the colon.  We will continue usual laxatives but will hold scheduled MiraLAX in favor of a MiraLAX bowel prep. 2/24 at least 6 BMs since bowel prep-abd softer- increase TF back to 60.hr    Brain injury 2/2 acute intraparenchymal and intraventricular hemorrhage/Bilateral multifocal ischemic infarcts with assoc muscle hypertonicity  -Respiratory status has been more stable without recurrent episodes of Cheyne-Stokes pattern -Continue baclofen, Symmetrel and scheduled Oxy IR  -Zyprexa at bedtime only.   -No real progress per OT and they have signed off   Transaminitis -2/2 Zyprexa -Resolved  Tracheobronchitis, chronic (HCC) -Has had 2 episodes since admission and has completed antibiotics -CT sinuses: rhinosinusitis worse on the left- wife ported patient has history of chronic sinus issues    Pneumonia due to Klebsiella pneumoniae (HCC) Resolved  Pressure injury of skin Resolved-prior anus  ulcer  Acute urinary retention -Continue Urecholine -Foley  catheter discontinued on 1/11 and patient voiding easily  Acute kidney injury on CKD 3b/acute azotemia with mild hypernatremia- (present on admission) -Baseline renal function: BUN 18 and cr 1.49 -Recent cr 1.06 -Na stable-continue free water 250 cc q 4 hrs  MSSA (methicillin susceptible Staphylococcus aureus) pneumonia (HCC) Resolved  Pneumonia due to Serratia marcescens (HCC) Resolved  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 Norvasc to 10 mg  Continues to have early am pre meds elevated BP readings        Subjective:  Sent with large liquid BM overnight, otherwise no significant events as discussed with staff  Physical Exam: Vitals:   03/29/21 0305 03/29/21 0349 03/29/21 0807 03/29/21 0808  BP:  (!) 121/103 138/85   Pulse: (!) 103 99 94   Resp: 16 16 18    Temp:  98 F (36.7 C) 98.1 F (36.7 C)   TempSrc:  Axillary Axillary   SpO2: 96% 98% 96% 97%  Weight:      Height:       Noncommunicative, not interactive, does not track or follow any commands.   Present with no significant secretions Clear to auscultations Regular rate and rhythm  Abdomen soft, PEG present  Remedies with no edema, offloading boots present bilaterally    Data Reviewed: There are no new results to review at this time.  Medically stable: Yes  Family Communication:  None at bedside   DVT Prophylaxis  ., Scd's  Heparin injection 5,000 units     Mobility  Assessment (most recent)     Mobility Assessment - 03/28/21 2000     Does patient have an order for bedrest or is patient medically unstable Yes- Bedfast (Level 1) - Complete  What is the highest level of mobility based on the progressive mobility assessment? Level 1 (Bedfast) - Unable to balance while sitting on edge of bed    Is the above level different from baseline mobility prior to current illness? No - Consider discontinuing PT/OT               COVID vaccination status:  Unknown  Consultants: Neurology PCCM Medicine   Procedures: Echocardiogram EEG Cortrack PEG tube placement Tracheostomy tube placement     Author: 03/30/21, MD 03/29/2021 10:05 AM  For on call review [email or web address].

## 2021-03-29 DIAGNOSIS — R197 Diarrhea, unspecified: Secondary | ICD-10-CM

## 2021-03-29 LAB — GLUCOSE, CAPILLARY
Glucose-Capillary: 145 mg/dL — ABNORMAL HIGH (ref 70–99)
Glucose-Capillary: 151 mg/dL — ABNORMAL HIGH (ref 70–99)
Glucose-Capillary: 151 mg/dL — ABNORMAL HIGH (ref 70–99)
Glucose-Capillary: 160 mg/dL — ABNORMAL HIGH (ref 70–99)
Glucose-Capillary: 186 mg/dL — ABNORMAL HIGH (ref 70–99)
Glucose-Capillary: 187 mg/dL — ABNORMAL HIGH (ref 70–99)

## 2021-03-29 LAB — BASIC METABOLIC PANEL
Anion gap: 8 (ref 5–15)
BUN: 56 mg/dL — ABNORMAL HIGH (ref 6–20)
CO2: 29 mmol/L (ref 22–32)
Calcium: 9.9 mg/dL (ref 8.9–10.3)
Chloride: 116 mmol/L — ABNORMAL HIGH (ref 98–111)
Creatinine, Ser: 1.04 mg/dL (ref 0.61–1.24)
GFR, Estimated: >60 mL/min (ref >60)
Glucose, Bld: 157 mg/dL — ABNORMAL HIGH (ref 70–99)
Potassium: 3.7 mmol/L (ref 3.5–5.1)
Sodium: 153 mmol/L — ABNORMAL HIGH (ref 135–145)

## 2021-03-29 MED ORDER — FREE WATER
250.0000 mL | Status: DC
  Administered 2021-03-29 – 2021-04-01 (×36): 250 mL

## 2021-03-29 NOTE — Plan of Care (Signed)
Education provided to family, pt unable to respond    Problem: Health Behavior/Discharge Planning: Goal: Ability to manage health-related needs will improve Outcome: Not Progressing   Problem: Clinical Measurements: Goal: Ability to maintain clinical measurements within normal limits will improve Outcome: Progressing Goal: Will remain free from infection Outcome: Progressing Goal: Diagnostic test results will improve Outcome: Progressing Goal: Respiratory complications will improve Outcome: Progressing Goal: Cardiovascular complication will be avoided Outcome: Progressing   Problem: Activity: Goal: Risk for activity intolerance will decrease Outcome: Not Progressing   Problem: Nutrition: Goal: Adequate nutrition will be maintained Outcome: Progressing   Problem: Coping: Goal: Level of anxiety will decrease Outcome: Progressing   Problem: Elimination: Goal: Will not experience complications related to bowel motility Outcome: Progressing Goal: Will not experience complications related to urinary retention Outcome: Progressing

## 2021-03-30 DIAGNOSIS — E43 Unspecified severe protein-calorie malnutrition: Secondary | ICD-10-CM

## 2021-03-30 LAB — BASIC METABOLIC PANEL
Anion gap: 8 (ref 5–15)
BUN: 53 mg/dL — ABNORMAL HIGH (ref 6–20)
CO2: 28 mmol/L (ref 22–32)
Calcium: 9.7 mg/dL (ref 8.9–10.3)
Chloride: 111 mmol/L (ref 98–111)
Creatinine, Ser: 0.95 mg/dL (ref 0.61–1.24)
GFR, Estimated: >60 mL/min (ref >60)
Glucose, Bld: 154 mg/dL — ABNORMAL HIGH (ref 70–99)
Potassium: 3.7 mmol/L (ref 3.5–5.1)
Sodium: 147 mmol/L — ABNORMAL HIGH (ref 135–145)

## 2021-03-30 LAB — GLUCOSE, CAPILLARY
Glucose-Capillary: 138 mg/dL — ABNORMAL HIGH (ref 70–99)
Glucose-Capillary: 159 mg/dL — ABNORMAL HIGH (ref 70–99)
Glucose-Capillary: 180 mg/dL — ABNORMAL HIGH (ref 70–99)
Glucose-Capillary: 144 mg/dL — ABNORMAL HIGH (ref 70–99)
Glucose-Capillary: 156 mg/dL — ABNORMAL HIGH (ref 70–99)
Glucose-Capillary: 142 mg/dL — ABNORMAL HIGH (ref 70–99)

## 2021-03-30 NOTE — Plan of Care (Signed)
Pt had a bath this morning. Cond. Cath in place. Trach cleaned and airway clear. Pt has been turned q2hrs. Family has came to visit.  Problem: Health Behavior/Discharge Planning: Goal: Ability to manage health-related needs will improve Outcome: Progressing   Problem: Clinical Measurements: Goal: Ability to maintain clinical measurements within normal limits will improve Outcome: Progressing Goal: Will remain free from infection Outcome: Progressing Goal: Diagnostic test results will improve Outcome: Progressing Goal: Respiratory complications will improve Outcome: Progressing Goal: Cardiovascular complication will be avoided Outcome: Progressing   Problem: Activity: Goal: Risk for activity intolerance will decrease Outcome: Progressing   Problem: Nutrition: Goal: Adequate nutrition will be maintained Outcome: Progressing   Problem: Coping: Goal: Level of anxiety will decrease Outcome: Progressing   Problem: Elimination: Goal: Will not experience complications related to bowel motility Outcome: Progressing Goal: Will not experience complications related to urinary retention Outcome: Progressing   Problem: Pain Managment: Goal: General experience of comfort will improve Outcome: Progressing   Problem: Safety: Goal: Ability to remain free from injury will improve Outcome: Progressing   Problem: Skin Integrity: Goal: Risk for impaired skin integrity will decrease Outcome: Progressing   Problem: Education: Goal: Knowledge of disease or condition will improve Outcome: Progressing Goal: Knowledge of secondary prevention will improve (SELECT ALL) Outcome: Progressing Goal: Knowledge of patient specific risk factors will improve (INDIVIDUALIZE FOR PATIENT) Outcome: Progressing Goal: Individualized Educational Video(s) Outcome: Progressing   Problem: Coping: Goal: Will verbalize positive feelings about self Outcome: Progressing Goal: Will identify appropriate  support needs Outcome: Progressing   Problem: Health Behavior/Discharge Planning: Goal: Ability to manage health-related needs will improve Outcome: Progressing   Problem: Self-Care: Goal: Ability to participate in self-care as condition permits will improve Outcome: Progressing Goal: Verbalization of feelings and concerns over difficulty with self-care will improve Outcome: Progressing Goal: Ability to communicate needs accurately will improve Outcome: Progressing   Problem: Nutrition: Goal: Risk of aspiration will decrease Outcome: Progressing Goal: Dietary intake will improve Outcome: Progressing   Problem: Intracerebral Hemorrhage Tissue Perfusion: Goal: Complications of Intracerebral Hemorrhage will be minimized Outcome: Progressing

## 2021-03-30 NOTE — Progress Notes (Signed)
° °  NAME:  Stephen Delgado, MRN:  [id number], DOB:  19-Feb-1965, LOS: 90 ADMISSION DATE:  12/30/2020, CONSULTATION DATE:  12/19 REFERRING MD:  Erlinda Hong, CHIEF COMPLAINT:  Dyspnea   History of Present Illness:  56 yo male presented with headache, nausea and altered mental status.  CT head showed acute Lt ICH with IVH likely related to hypertensive emergency (BP 195/116). Had persistent hypertension and concern for airway protection, and PCCM asked to assist with ICU management.  Tracheostomy 12/13.  Moved out of ICU.   Pertinent  Medical History  HTN, DM type 2, CKD 3a, OSA  Significant Hospital Events: Including procedures, antibiotic start and stop dates in addition to other pertinent events   11/29 Admit, start cleviprex 12/01 PCCM consulted; changed from cleviprex to cardene due to elevated triglycerides 12/03 intubated; episode of vomiting >> tube feeds held 12/04 resume trickle tube feeds 12/06 remains minimally responsive. Tolerating SBT.  12/12 no acute events overnight, T-max 102 point 12/13 bedside trach planned midmorning  12/14 ATC. Lasix --> 5L UOP  12/15 cont on trach collar. Na to 154 from 145. Adding low rate d5  12/16 scheduled for PEG. Cont D5. Continues on trach collar Out of ICU 12/19 12/23 Cheyne-Stokes respirations noted and was transferred to ICU for short-term ventilation.  12/25 Returned to floor 1/9 Trach changed to 6 cuffless  2/6 Trach follow up, no major changes, remains on ATC  2/27 trach f/u, on TC, seems to attend briefly, otherwise poor neuro exam  Imaging/Studies: CT head 11/29 >> large amount of IVH involving lateral/3rd/4th ventricles, ICH in Lt BG, chronic microvascular ischemic changes Echo 11/30 >> EF 50 to 55%, mod LVH, grade 1 DD, ascending aorta 37 mm EEG 12/01 >> continuous generalized slowing MRI brain 12/03 >> numerous small acute infarcts in b/l cerebral hemispheres, Lt caudate hemorrhage, IVH, scattered sulcal SAH, mild communicating  hydrocephalus CT head 12/23 Expected evolution of strokes, no shift or herniation    Interim History / Subjective:  No sig changes  Objective   Blood pressure 129/85, pulse 90, temperature 98.5 F (36.9 C), temperature source Axillary, resp. rate 20, height 6' (1.829 m), weight 83.5 kg, SpO2 100 %.    FiO2 (%):  [20 %-21 %] 21 %   Intake/Output Summary (Last 24 hours) at 03/30/2021 0855 Last data filed at 03/30/2021 0805 Gross per 24 hour  Intake 2310 ml  Output 2800 ml  Net -490 ml    Filed Weights   03/13/21 0353 03/15/21 0509 03/16/21 0500  Weight: 83.9 kg 83.9 kg 83.5 kg   Physical exam  General 56 year old male who remains unresponsive HENT 6 cuffless trach w/ some thin clear secretions  Pulm NWOB, on TC Card rrr Abd soft, NT Ext warm  Neuro eyes open. 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

## 2021-03-30 NOTE — TOC Progression Note (Addendum)
Transition of Care [facility]) - Progression Note    Patient Details  Name: Stephen Delgado MRN: [id number] Date of Birth: 10-19-1965  Transition of Care [facility]) CM/SW Contact  Stephen Bridgeman, RN Phone Number: 03/30/2021, 12:45 PM  Clinical Narrative:    CM called and spoke with Stephen Delgado, CM with [facility] to discuss need for SNF/LTC placement for care since the patient's wife has stated that she is unable to provide 24 hour care to the patient in the home.  [facility] states that she does not have bed availability for LTC placement with a trach but will reach out to Reyne Dumas, Admissions leadership to determine available facility with open beds for LTC placement.  I called and left a secure voicemail message with the patient's son, Stephen Delgado, at the request of the patient's wife.  I also left a message with the patient's family - Stephen Delgado, sister-in-law.  03/30/2021 1442 - I called and spoke with the patient's sister-in-law, Stephen Delgado, at permission of the patient's wife to discuss transitions of care needs.  Stephen Delgado was given the contact number to [facility] worker and she plans to follow up with the patient's wife and son to assist.  CM and MSW with DTP Team will continue to follow the patient for [facility] needs and discharge planning for SNF placement.   Expected Discharge Plan: Home w Home Health Services Barriers to Discharge: Continued Medical Work up  Expected Discharge Plan and Services Expected Discharge Plan: Home w Home Health Services In-house Referral: Clinical Social Work, Hospice / Palliative Care, PCP / Health Connect Discharge Planning Services: CM Consult Post Acute Care Choice: Home Health Living arrangements for the past 2 months: Single Family Home                 DME Arranged: Hospital bed, Trach supplies, Tube feeding, Oxygen DME Agency: [employer] Date DME Agency Contacted: 03/18/21 Time DME Agency Contacted: 1000 Representative  spoke with at DME Agency: Stephen Delgado, CM with [employer]             Social Determinants of Health (SDOH) Interventions    Readmission Risk Interventions Readmission Risk Prevention Plan 02/20/2021  Transportation Screening Complete  Medication Review (RN Care Manager) Complete  PCP or Specialist appointment within 3-5 days of discharge Not Complete  PCP/Specialist Appt Not Complete comments Patient will need LTC placement at SNF facility.  HRI or Home Care Consult Complete  SW Recovery Care/Counseling Consult Complete  Palliative Care Screening Complete  Skilled Nursing Facility Complete  Some recent data might be hidden

## 2021-03-30 NOTE — Progress Notes (Signed)
Progress Note   Patient: Stephen Delgado:[id number] DOB: 08-Jan-1966 DOA: 12/30/2020     90 DOS: the patient was seen and examined on 03/30/2021      Brief hospital course: 56 y.o. male with PMH significant for DM2, HTN, CKD with questionable compliance to medications Patient presented to the ED on 12/30/2020 with complaint of headache, altered mental status   In the ED, he was hypertensive to 238/160, agitated, required restraints CT head showed large amount of intraventricular hemorrhage involving lateral/third/fourth ventricle, ICH and left basal ganglia as well as chronic microvascular ischemic changes. He was started on Cleviprex drip and admitted to neuro ICU   He subsequently had CT head repeated on 11/30, 12/1 and 12/3, all demonstrated unchanged intraparenchymal hemorrhage and intraventricular hemorrhage 12/3, MRI brain from also showed numerous small acute infarcts in both cerebral hemisphere with minimal improvement in the right superior cerebellum 12/3, intubated 12/13, tracheostomy 12/15, wean from vent to trach collar 12/16, PEG tube placement 12/16, repeat MRI showed an increase in the size and number of acute infarcts in the bilateral hemispheres with increased involvement in the cerebellum bilaterally.   12/18, transferred from neuro service to [facility]  12/21, palliative care consulted.  Family wanted to continue aggressive care as full CODE STATUS. 12/23 developed Cheyne-Stokes respiratory pattern with respiratory alkalosis.  Transferred to ICU for short-term mechanical ventilation and was found to have new tracheobronchitis now on cefepime 12/25 back to progressive floor and to [facility]   His hospital course has been complicated by significant neurological impairment, aspiration events, AKI, hypernatremia, urinary retention. 1/9 trach changed to #6.0 Shiley cuffless--1/10 > 30 days since trach inserted.  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   -No real progress per OT and they have signed off   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 (recent MSSA and serratia pneumonia; recent Klebsiella tracheobronchitis);- (present on admission) Continue routine trach care Does not require suctioning   Acute hypernatremia secondary to laxative induced diarrhea Previously had resolved but recurred after aggressive use of laxatives and stool softeners to treat severe obstipation Continue free water Follow labs Diarrhea resolved with discontinuation of laxatives and stool softeners  Malignant hypertension- (present on admission) Continue Coreg, hydralazine, and clonidine 2/20 increase p.m. Norvasc to 10 mg  Continues to have early am pre meds elevated BP readings  Acute kidney injury on CKD 3b/acute azotemia with mild hypernatremia- (present on admission) -Baseline renal function: BUN 18 and cr 1.49 -Recent cr 1.06 -Na stable-continue free water 250 cc q 4 hrs  Diabetes mellitus type 2 in obese (HCC) -A1c 6.3 in 12/31/2020 -Continue Levemir to 24 units BID; continue SSI every 4 hours -CBGs well controlled  Left scleral lesion/scleral hemorrhage    -Wife states dark pigmented areas in eyes have been there for multiple years without change -completed prednisone eye drops- on NS only    Transaminitis -2/2 Zyprexa -Resolved  Pneumonia due to Klebsiella pneumoniae (HCC) Resolved  Protein-calorie malnutrition/dysphagia due to recent stroke/constipation Nutrition Problem: Inadequate oral intake Etiology: lethargy/confusion, dysphagia Signs/Symptoms: NPO status Interventions: Tube feeding via PEG Body mass index  is 23.33 kg/m.  Continue Glucern 25 cc/h until severe constipation resolved Continue lactulose.  X-ray 2/23 reveals resolution of rectal stool impaction but continues to have a  significant amount of retained stool in the colon.  We will continue usual laxatives but will hold scheduled MiraLAX in favor of a MiraLAX bowel prep. 2/24 at least 6 BMs since bowel prep-abd softer- increase TF back to 60.hr    Acute urinary retention-resolved as of 03/30/2021 -Continue Urecholine -Foley catheter discontinued on 1/11 and patient voiding easily  MSSA (methicillin susceptible Staphylococcus aureus) pneumonia (HCC) Resolved  Pressure injury of skin Resolved-prior anus ulcer  Pneumonia due to Serratia marcescens ([place]) Resolved  Hypernatremia Sodium still increasing, it is 153 this morning despite increasing his free water via PEG, I will go ahead and increase it further to 250 cc every 2 hours, will monitor BMP in a.m., and if remains elevated then will start on D5W. -I did discontinue all his laxatives as he has been having diarrhea.  Constipation Constipation has resolved, patient currently with diarrhea, lactulose discontinued        Subjective:  Unresponsive.  Eyes are not open today and do not open with stimulation.  Physical Exam: Vitals:   03/30/21 0347 03/30/21 0410 03/30/21 0729 03/30/21 0758  BP:  135/85  129/85  Pulse: 87 88 88 90  Resp: 16 18 18 20   Temp:  98.6 F (37 C)  98.5 F (36.9 C)  TempSrc:  Oral  Axillary  SpO2: 94% 99% 97% 100%  Weight:      Height:       Constitutional: No acute distress, unresponsive with open Respiratory: #6.0 Shiley cuffless trach, room air- lung sounds CTA bilaterally--O2 sats between 100% -normal respiratory pattern  Cardiovascular: S1-S2, NSR, normotensive -no bilateral lower extremity edema  Abdomen:  Abdomen soft and nontender.  PEG tube -normoactive bowels -LBM 2/26 Neurologic: Upper extremities are flaccid to PROM.-No further  hypertonicity of lower extremities elicited with PROM.  Psychiatric: Eyes are closed today.    Data Reviewed: There are no new results to review at this time.  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  At this time disposition remains in flux with TOC communicating on a regular basis with long-term care facilities as well as with the patient's primary insurance company.   A physical therapy consult is indicated based on the patients mobility assessment.   Mobility Assessment (last 72 hours)     Mobility Assessment     Row Name 03/29/21 2000 03/29/21 1800 03/29/21 1400 03/29/21 1200 03/29/21 0800   Does patient have an order for bedrest or is patient medically unstable Yes- Bedfast (Level 1) - Complete Yes- Bedfast (Level 1) - Complete Yes- Bedfast (Level 1) - Complete Yes- Bedfast (Level 1) - Complete Yes- Bedfast (Level 1) - Complete   What is the highest level of mobility based on the progressive mobility assessment? Level 1 (Bedfast) - Unable to balance while sitting on edge of bed -- Level 1 (Bedfast) - Unable to balance while sitting on edge of bed Level 1 (Bedfast) - Unable to balance while sitting on edge of bed Level 1 (Bedfast) - Unable to balance while sitting on edge of bed   Is the above level different from baseline mobility prior to current illness? No - Consider discontinuing PT/OT -- Yes - Recommend PT order Yes - Recommend PT order Yes - Recommend PT order  [place] Name 03/28/21 2000 03/28/21 0800 03/27/21 2000       Does patient have an order for bedrest or is patient medically unstable Yes- Bedfast (Level 1) - Complete Yes- Bedfast (Level 1) - Complete Yes- Bedfast (Level 1) - Complete     What is the  highest level of mobility based on the progressive mobility assessment? Level 1 (Bedfast) - Unable to balance while sitting on edge of bed Level 1 (Bedfast) - Unable to balance while sitting on edge of bed --     Is the above level different from baseline mobility prior to current illness? No - Consider discontinuing PT/OT No - Consider discontinuing PT/OT --              COVID vaccination status:  Unknown  Consultants: Neurology PCCM Medicine   Procedures: Echocardiogram EEG Cortrack PEG tube placement Tracheostomy tube placement   Planned Discharge Destination:  Home with Home Health     Time spent: 15 minutes  Author: Erin Hearing, NP 03/30/2021 11:09 AM  For on call review [email or web address].

## 2021-03-30 NOTE — Progress Notes (Addendum)
Nutrition Follow-up  DOCUMENTATION CODES:  Severe malnutrition in context of chronic illness  INTERVENTION:  Continue TF via PEG: -Glucerna 1.5 @ goal rate of 36m/hr (14425mday)  -Free water per MD/NP, currently 25070mree water Q2H  At goal, TF regimen will provide 2160 kcals, 118 grams protein, 1092m63mee water (4092ml22mal free water with flushes)  Recommend continue 1 packet Juven BID, each packet provides 95 calories, 2.5 grams of protein (collagen), and 9.8 grams of carbohydrate (3 grams sugar); also contains 7 grams of L-arginine and L-glutamine, 300 mg vitamin C, 15 mg vitamin E, 1.2 mcg vitamin B-12, 9.5 mg zinc, 200 mg calcium, and 1.5 g  Calcium Beta-hydroxy-Beta-methylbutyrate to support wound healing  NUTRITION DIAGNOSIS:  Severe Malnutrition related to chronic illness as evidenced by severe muscle depletion, severe fat depletion. -- ongoing  GOAL:  Patient will meet greater than or equal to 90% of their needs -- addressing with TF, met when TF at goal  MONITOR:  TF tolerance, Labs, I & O's, Skin  REASON FOR ASSESSMENT:  Ventilator, Consult Enteral/tube feeding initiation and management  ASSESSMENT:  55 ye43 old male who presented to the ED on 11/29 with AMS. PMH of HTN, T2DM, CKD stage III. 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  11/30 cortrak placed; tip gastric 12/2 trickle TF 12/3 pt intubated due to increased WOB, +emesis, TF held 12/4 trickle TF restarted 12/5 advancement orders placed 12/9 TF adjusted 12/13 s/p tracheostomy 12/16 s/p EGD and PEG placement 12/19 tx out of ICU to [facility]  [facility]23 developed Cheyne-Stokes respiratory pattern with respiratory alkalosis.Transferred to ICU for short-term mechanical ventilation and was found to have new tracheobronchitis now on cefepime 12/25 [facility] changed to #6 shiley cuffless  1/10 marks 30 days since trach placed 2/17 trach changed     Pt continues to be minimally responsive, tolerating ATC and continues to be NPO requiring TF via PEG. Pt returned to continuous TF rate as of 2/23 and advanced to goal as of 2/24. Pt tolerating the following TF via PEG: Glucerna 1.5 @ 60ml/19m/ 250ml f41mwater Q2H. Per MD, pt had recurrence of diarrhea and hypernatremia  2/2 laxative use when attempted to relieve stool burden last week, but diarrhea has since resolved with discontinuation of stool softeners and laxatives. Sodium improving, continue free water per NP/MD   UOP: 1350ml x285murs I/O: -7920ml sin54mdmit  Admit wt 85.2 kg Current wt 83.5 kg   Medications: protonix, SSI Q4H, juven BID Labs reviewed. Na 147 (H - improving) CBGs: 138-186 x 24 hours  Diet Order:   Diet Order             Diet NPO time specified  Diet effective midnight                  EDUCATION NEEDS:  Not appropriate for education at this time  Skin:  Skin Assessment: Reviewed RN Assessment Skin Integrity Issues:: Stage II Stage II: anus  Last BM:  2/25  Height:  Ht Readings from Last 1 Encounters:  01/23/21 6' (1.829 m)   Weight:  Wt Readings from Last 1 Encounters:  03/16/21 83.5 kg   BMI:  Body mass index is 24.95 kg/m.  Estimated Nutritional Needs:  Kcal:  2150-2350 Protein:  105-120 grams Fluid:  >2L   Shealeigh Dunstan A.[name], LDN (she/her/hers) RD pager number and weekend/on-call pager number located in [email or web address]

## 2021-03-30 NOTE — Assessment & Plan Note (Addendum)
Recent episode 2/2 volume loss from diarrhea after aggressive bowel prep for severe obstipation Na 141 on 3/1 Had resolved but after frequency of free H2O boluses decreased Na back up to 146 w/ concurrent mild elevation in BUN 3/3 Increase free H2O to q 2 hrs 3/8 abdomen more distended so will increase MiraLAX to BID and begin low-dose daily Repeat electrolyte panel in the a.m.

## 2021-03-31 LAB — GLUCOSE, CAPILLARY
Glucose-Capillary: 149 mg/dL — ABNORMAL HIGH (ref 70–99)
Glucose-Capillary: 146 mg/dL — ABNORMAL HIGH (ref 70–99)
Glucose-Capillary: 123 mg/dL — ABNORMAL HIGH (ref 70–99)
Glucose-Capillary: 149 mg/dL — ABNORMAL HIGH (ref 70–99)
Glucose-Capillary: 119 mg/dL — ABNORMAL HIGH (ref 70–99)

## 2021-03-31 NOTE — TOC Progression Note (Addendum)
Transition of Care [facility]) - Progression Note    Patient Details  Name: CORDARRELL SANE MRN: [id number] Date of Birth: Nov 14, 1965  Transition [facility]) CM/SW Contact  [name], RN Phone Number: 03/31/2021, 11:48 AM  Clinical Narrative:    CM called and spoke with Genia Harold, CM with [facility] and she is unable to offers an admission bed at this time.  CM called and spoke with Eunice Blase, CM with [facility] and the facility has available LTC male beds - clinicals sent in the hub to review for admission.   CM and MSW with DTP Team will continue to follow the patient for discharge planning.   Expected Discharge Plan: Home w Home Health Services Barriers to Discharge: Continued Medical Work up  Expected Discharge Plan and Services Expected Discharge Plan: Home w Home Health Services In-house Referral: Clinical Social Work, Hospice / Palliative Care, PCP / Health Connect Discharge Planning Services: CM Consult Post Acute Care Choice: Home Health Living arrangements for the past 2 months: Single Family Home                 DME Arranged: Hospital bed, Trach supplies, Tube feeding, Oxygen DME Agency: [employer] Date DME Agency Contacted: 03/18/21 Time DME Agency Contacted: 1000 Representative spoke with at DME Agency: Jenness Corner, CM with [employer]             Social Determinants of Health (SDOH) Interventions    Readmission Risk Interventions Readmission Risk Prevention Plan 02/20/2021  Transportation Screening Complete  Medication Review (RN Care Manager) Complete  PCP or Specialist appointment within 3-5 days of discharge Not Complete  PCP/Specialist Appt Not Complete comments Patient will need LTC placement at SNF facility.  HRI or Home Care Consult Complete  SW Recovery Care/Counseling Consult Complete  Palliative Care Screening Complete  Skilled Nursing Facility Complete  Some recent data might be hidden

## 2021-03-31 NOTE — Progress Notes (Signed)
Progress Note   Patient: Stephen Delgado [id number] DOB: [birth year] DOA: 12/30/2020     91 DOS: the patient was seen and examined on 03/31/2021      Brief hospital course: 56 y.o. male with PMH significant for DM2, HTN, CKD with questionable compliance to medications Patient presented to the ED on 12/30/2020 with complaint of headache, altered mental status   In the ED, he was hypertensive to 238/160, agitated, required restraints CT head showed large amount of intraventricular hemorrhage involving lateral/third/fourth ventricle, ICH and left basal ganglia as well as chronic microvascular ischemic changes. He was started on Cleviprex drip and admitted to neuro ICU   He subsequently had CT head repeated on 11/30, 12/1 and 12/3, all demonstrated unchanged intraparenchymal hemorrhage and intraventricular hemorrhage 12/3, MRI brain from also showed numerous small acute infarcts in both cerebral hemisphere with minimal improvement in the right superior cerebellum 12/3, intubated 12/13, tracheostomy 12/15, wean from vent to trach collar 12/16, PEG tube placement 12/16, repeat MRI showed an increase in the size and number of acute infarcts in the bilateral hemispheres with increased involvement in the cerebellum bilaterally.   12/18, transferred from neuro service to [facility]  12/21, palliative care consulted.  Family wanted to continue aggressive care as full CODE STATUS. 12/23 developed Cheyne-Stokes respiratory pattern with respiratory alkalosis.  Transferred to ICU for short-term mechanical ventilation and was found to have new tracheobronchitis now on cefepime 12/25 back to progressive floor and to [facility]   His hospital course has been complicated by significant neurological impairment, aspiration events, AKI, hypernatremia, urinary retention. 1/9 trach changed to #6.0 Shiley cuffless--1/10 > 30 days since trach inserted.  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   Mobility Assessment (last 72 hours)     Mobility Assessment     Row Name 03/29/21 2000 03/29/21 1800 03/29/21 1400 03/29/21 1200 03/29/21 0800   Does patient have an order for bedrest or is patient medically unstable Yes- Bedfast (Level 1) - Complete Yes- Bedfast (Level 1) - Complete Yes- Bedfast (Level 1) - Complete Yes- Bedfast (Level 1) - Complete Yes- Bedfast (Level 1) - Complete   What is the highest level of mobility based on the progressive mobility assessment? Level 1 (Bedfast) - Unable to balance while sitting on edge of bed -- Level 1 (Bedfast) - Unable to balance while sitting on edge of bed Level 1 (Bedfast) - Unable to balance while sitting on edge of bed Level 1 (Bedfast) - Unable to balance while sitting on edge of bed   Is the above level different from baseline mobility prior to current illness? No - Consider discontinuing PT/OT -- Yes - Recommend PT order Yes - Recommend PT order Yes - Recommend PT order    [place] Name 03/28/21 2000 03/28/21 0800         Does patient have an order for bedrest or is patient medically unstable Yes- Bedfast (Level 1) - Complete Yes- Bedfast (Level 1) - Complete      What is the highest level of mobility based on the progressive mobility assessment? Level 1 (Bedfast) - Unable to balance while sitting on edge of bed Level 1 (  Bedfast) - Unable to balance while sitting on edge of bed      Is the above level different from baseline mobility prior to current illness? 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 Norvasc to 10 mg  Continues to have early am pre meds elevated BP readings  Acute kidney injury on CKD 3b/acute azotemia with mild hypernatremia- (present on admission) -Baseline renal function: BUN 18 and cr 1.49 -Recent cr 1.06 -Na stable-continue free water 250 cc q 4 hrs  Diabetes mellitus type 2 in obese (HCC) -A1c 6.3 in 12/31/2020 -Continue Levemir to 24 units BID; continue SSI every 4 hours -CBGs well controlled  Left scleral lesion/scleral hemorrhage    -Wife states dark pigmented areas in eyes have been there for multiple years without change -completed prednisone eye drops- on NS only    Transaminitis -2/2 Zyprexa -Resolved  Pneumonia due to Klebsiella pneumoniae ([place]) Resolved  Protein-calorie malnutrition/dysphagia due to recent stroke/constipation Nutrition Problem: Inadequate oral intake Etiology: lethargy/confusion, dysphagia Signs/Symptoms: NPO status Interventions: Tube feeding via PEG Body mass index is 23.33 kg/m.  Continue Glucern 25 cc/h until severe constipation resolved Continue lactulose.  X-ray 2/23 reveals resolution of rectal stool impaction but continues to have a significant amount of retained stool in the colon.  We will continue usual laxatives but will hold scheduled MiraLAX in favor of a MiraLAX bowel prep. 2/24  at least 6 BMs since bowel prep-abd softer- increase TF back to 60.hr    Acute urinary retention-resolved as of 03/30/2021 -Continue Urecholine -Foley catheter discontinued on 1/11 and patient voiding easily  MSSA (methicillin susceptible Staphylococcus aureus) pneumonia (HCC) Resolved  Pressure injury of skin Resolved-prior anus ulcer  Pneumonia due to Serratia marcescens ([place]) Resolved  Hypernatremia Sodium still increasing, it is 153 this morning despite increasing his free water via PEG, I will go ahead and increase it further to 250 cc every 2 hours, will monitor BMP in a.m., and if remains elevated then will start on D5W. -I did discontinue all his laxatives as he has been having diarrhea.  Constipation Constipation has resolved, patient currently with diarrhea, lactulose discontinued        Subjective:    Physical Exam: Vitals:   03/31/21 0407 03/31/21 0500 03/31/21 0729 03/31/21 0742  BP:  140/83 133/82   Pulse: 89 89 90 89  Resp: 20 20 14 20   Temp:  98.9 F (37.2 C) 98.7 F (37.1 C)   TempSrc:  Axillary Oral   SpO2: 98% 96% 100% 99%  Weight:      Height:       Constitutional: No acute distress, unresponsive with open Respiratory: #6.0 Shiley cuffless trach, room air- lung sounds CTA bilaterally--O2 sats between 100% -normal respiratory pattern  Cardiovascular: S1-S2, NSR, normotensive -no bilateral lower extremity edema  Abdomen:  Abdomen soft and nontender.  PEG tube -normoactive bowels -LBM 2/26 Neurologic: Upper extremities are flaccid to PROM.-No further  hypertonicity of lower extremities elicited with PROM.  Psychiatric: Eyes are closed today.    Data Reviewed: There are no new results to review at this time.  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  At this time disposition remains in flux with TOC communicating on a regular basis with long-term care facilities as well as with the patient's primary insurance company.     COVID vaccination status:  Unknown  Consultants: Neurology PCCM Medicine   Procedures: Echocardiogram EEG Cortrack PEG tube placement Tracheostomy tube placement   Planned Discharge Destination:  Home with Home Health     Time spent: 15 minutes  Author: Erin Hearing, NP 03/31/2021 10:26 AM  For on call review [email or web address].

## 2021-04-01 LAB — BASIC METABOLIC PANEL
Anion gap: 9 (ref 5–15)
BUN: 43 mg/dL — ABNORMAL HIGH (ref 6–20)
CO2: 25 mmol/L (ref 22–32)
Calcium: 9.5 mg/dL (ref 8.9–10.3)
Chloride: 107 mmol/L (ref 98–111)
Creatinine, Ser: 0.94 mg/dL (ref 0.61–1.24)
GFR, Estimated: >60 mL/min (ref >60)
Glucose, Bld: 169 mg/dL — ABNORMAL HIGH (ref 70–99)
Potassium: 3.5 mmol/L (ref 3.5–5.1)
Sodium: 141 mmol/L (ref 135–145)

## 2021-04-01 LAB — GLUCOSE, CAPILLARY
Glucose-Capillary: 136 mg/dL — ABNORMAL HIGH (ref 70–99)
Glucose-Capillary: 138 mg/dL — ABNORMAL HIGH (ref 70–99)
Glucose-Capillary: 142 mg/dL — ABNORMAL HIGH (ref 70–99)
Glucose-Capillary: 143 mg/dL — ABNORMAL HIGH (ref 70–99)
Glucose-Capillary: 147 mg/dL — ABNORMAL HIGH (ref 70–99)
Glucose-Capillary: 156 mg/dL — ABNORMAL HIGH (ref 70–99)
Glucose-Capillary: 185 mg/dL — ABNORMAL HIGH (ref 70–99)

## 2021-04-01 MED ORDER — FREE WATER
200.0000 mL | Status: DC
  Administered 2021-04-01 (×2): 200 mL

## 2021-04-01 MED ORDER — FREE WATER
200.0000 mL | Status: DC
  Administered 2021-04-01 – 2021-04-03 (×11): 200 mL

## 2021-04-01 NOTE — TOC Progression Note (Addendum)
Transition of Care (TOC) - Progression Note  ? ? ?Patient Details  ?Name: Stephen Delgado ?MRN: [id number] ?Date of Birth: 1965-12-22 ? ?Transition of Care (TOC) CM/SW Contact  ?[name], RN ?Phone Number: ?04/01/2021, 8:22 AM ? ?Clinical Narrative:    ?CM sent message to Essie Hart, CM at [facility] to review clinicals. Izola Price, CM at the facility was given contact information for Dartha Lodge, RNCM at [facility] to clarify insurance coverage at the facility for possible admission if bed available. ? ?04/01/2021 1300 - CM called and spoke with Dartha Lodge, RNCM with [facility] and was updated that [facility] was currently reviewing the patient for possible admission.  [place], RNCM plans to call and speak with Essie Hart, CM at [facility] to follow up regarding benefits for admission. ? ?04/01/2021 1340 - CM called and spoke with Essie Hart, CM at [facility] SNF facility and the facility is willing to offer a bed to the patient is the patient's insurance provider is willing to assist with insurance authorization and placement. ? ?CM and MSW with DTP Team will continue to follow the patient for SNF placement. ? ? ? ? ?Expected Discharge Plan: Home w Home Health Services ?Barriers to Discharge: Continued Medical Work up ? ?Expected Discharge Plan and Services ?Expected Discharge Plan: Home w Home Health Services ?In-house Referral: Clinical Social Work, Hospice / Palliative Care, PCP / Health Connect ?Discharge Planning Services: CM Consult ?Post Acute Care Choice: Home Health ?Living arrangements for the past 2 months: Single Family Home ?                ?DME Arranged: Hospital bed, Trach supplies, Tube feeding, Oxygen ?DME Agency: [employer] ?Date DME Agency Contacted: 03/18/21 ?Time DME Agency Contacted: 1000 ?Representative spoke with at DME Agency: Jenness Corner, CM with [employer] ?  ?  ?  ?  ?  ? ? ?Social Determinants of Health (SDOH) Interventions ?  ? ?Readmission Risk Interventions ?Readmission  Risk Prevention Plan 02/20/2021  ?Transportation Screening Complete  ?Medication Review Oceanographer) Complete  ?PCP or Specialist appointment within 3-5 days of discharge Not Complete  ?PCP/Specialist Appt Not Complete comments Patient will need LTC placement at SNF facility.  ?HRI or Home Care Consult Complete  ?SW Recovery Care/Counseling Consult Complete  ?Palliative Care Screening Complete  ?Skilled Nursing Facility Complete  ?Some recent data might be hidden  ? ? ?

## 2021-04-01 NOTE — Progress Notes (Signed)
Progress Note   Patient: Stephen Delgado [id number] DOB: 28-Aug-1965 DOA: 12/30/2020     92 DOS: the patient was seen and examined on 04/01/2021      Brief hospital course: 56 y.o. male with PMH significant for DM2, HTN, CKD with questionable compliance to medications Patient presented to the ED on 12/30/2020 with complaint of headache, altered mental status   In the ED, he was hypertensive to 238/160, agitated, required restraints CT head showed large amount of intraventricular hemorrhage involving lateral/third/fourth ventricle, ICH and left basal ganglia as well as chronic microvascular ischemic changes. He was started on Cleviprex drip and admitted to neuro ICU   He subsequently had CT head repeated on 11/30, 12/1 and 12/3, all demonstrated unchanged intraparenchymal hemorrhage and intraventricular hemorrhage 12/3, MRI brain from also showed numerous small acute infarcts in both cerebral hemisphere with minimal improvement in the right superior cerebellum 12/3, intubated 12/13, tracheostomy 12/15, wean from vent to trach collar 12/16, PEG tube placement 12/16, repeat MRI showed an increase in the size and number of acute infarcts in the bilateral hemispheres with increased involvement in the cerebellum bilaterally.   12/18, transferred from neuro service to [facility]  12/21, palliative care consulted.  Family wanted to continue aggressive care as full CODE STATUS. 12/23 developed Cheyne-Stokes respiratory pattern with respiratory alkalosis.  Transferred to ICU for short-term mechanical ventilation and was found to have new tracheobronchitis now on cefepime 12/25 back to progressive floor and to [facility]   His hospital course has been complicated by significant neurological impairment, aspiration events, AKI, hypernatremia, urinary retention. 1/9 trach changed to #6.0 Shiley cuffless--1/10 > 30 days since trach inserted.  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   Mobility Assessment (last 72 hours)     Mobility Assessment     Row Name 03/29/21 2000 03/29/21 1800 03/29/21 1400 03/29/21 1200 03/29/21 0800   Does patient have an order for bedrest or is patient medically unstable Yes- Bedfast (Level 1) - Complete Yes- Bedfast (Level 1) - Complete Yes- Bedfast (Level 1) - Complete Yes- Bedfast (Level 1) - Complete Yes- Bedfast (Level 1) - Complete   What is the highest level of mobility based on the progressive mobility assessment? Level 1 (Bedfast) - Unable to balance while sitting on edge of bed -- Level 1 (Bedfast) - Unable to balance while sitting on edge of bed Level 1 (Bedfast) - Unable to balance while sitting on edge of bed Level 1 (Bedfast) - Unable to balance while sitting on edge of bed   Is the above level different from baseline mobility prior to current illness? No - Consider discontinuing PT/OT -- Yes - Recommend PT order Yes - Recommend PT order Yes - Recommend PT order    [place] Name 03/28/21 2000 03/28/21 0800         Does patient have an order for bedrest or is patient medically unstable Yes- Bedfast (Level 1) - Complete Yes- Bedfast (Level 1) - Complete      What is the highest level of mobility based on the progressive mobility assessment? Level 1 (Bedfast) - Unable to balance while sitting on edge of bed Level 1 (  Bedfast) - Unable to balance while sitting on edge of bed      Is the above level different from baseline mobility prior to current illness? 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 (recent MSSA and serratia pneumonia; recent Klebsiella tracheobronchitis);- (present on admission) Continue routine trach care Does not require suctioning   Acute hypernatremia secondary to laxative induced diarrhea Recent episode 2/2 volume loss from diarrhea after aggressive bowel prep for severe obstipation Na 141 on 3/1 Resolved Decrease free H20 back to 200 cc Resume daily miralax and if tolerates can resume BID colace  Malignant hypertension- (present on admission) Continue Norvasc Coreg, hydralazine, and clonidine   Acute kidney injury on CKD 3b/acute azotemia with mild hypernatremia- (present on admission) -Baseline renal function: BUN 18 and cr 1.49 -Recent cr 1.06 -Na stable-decrease free water to 200 cc q 4 hrs  Diabetes mellitus type 2 in obese (HCC) -A1c 6.3 in 12/31/2020 -Continue Levemir to 24 units BID; continue SSI every 4 hours -CBGs well controlled  Left scleral lesion/scleral hemorrhage-resolved as of 04/01/2021 Resolved     Transaminitis -2/2 Zyprexa -Resolved  Pneumonia due to Klebsiella pneumoniae (HCC)-resolved as of 04/01/2021 Resolved  Protein-calorie malnutrition/dysphagia due to recent stroke/constipation Nutrition Problem: Inadequate oral intake Etiology: lethargy/confusion, dysphagia Signs/Symptoms: NPO status Interventions: Tube feeding via PEG Body mass index is 23.33 kg/m.  Continue Glucern 25 cc/h until severe constipation resolved Continue lactulose.  X-ray 2/23 reveals resolution of rectal stool impaction but continues to have a significant amount of retained stool in the colon.  We will continue usual laxatives but will hold scheduled MiraLAX in favor of a MiraLAX bowel prep. 2/24 at least 6 BMs since bowel prep-abd softer- increase TF back to 60.hr    Acute urinary retention-resolved as of 03/30/2021 -Continue Urecholine -Foley catheter  discontinued on 1/11 and patient voiding easily  MSSA (methicillin susceptible Staphylococcus aureus) pneumonia (HCC) Resolved  Pressure injury of skin Resolved-prior anus ulcer  Pneumonia due to Serratia marcescens (HCC)-resolved as of 04/01/2021 Resolved  Hypernatremia Sodium still increasing, it is 153 this morning despite increasing his free water via PEG, I will go ahead and increase it further to 250 cc every 2 hours, will monitor BMP in a.m., and if remains elevated then will start on D5W. -I did discontinue all his laxatives as he has been having diarrhea.  Constipation Constipation has resolved, patient currently with diarrhea, lactulose discontinued        Subjective:    Physical Exam: Vitals:   03/31/21 2330 04/01/21 0339 04/01/21 0345 04/01/21 0732  BP:  (!) 148/84    Pulse: 85 88 88 86  Resp: 17 (!) 21 (!) 21 20  Temp:  98.9 F (37.2 C)    TempSrc:  Axillary    SpO2:  95% 95% 97%  Weight:      Height:       Constitutional: No acute distress, unresponsive but did open eyes to voice. Respiratory: #6.0 Shiley cuffless trach, room air- lung sounds CTA bilaterally--O2 sats between 100% -normal respiratory pattern  Cardiovascular: S1-S2, NSR, normotensive -no bilateral lower extremity edema  Abdomen:  Abdomen soft and nontender.  PEG tube -normoactive bowels -LBM 2/27 Neurologic: Upper extremities are flaccid to PROM.-No further hypertonicity of lower extremities elicited with PROM.  Psychiatric: Opens eyes to voice-did not appear to focus on speaker and did  not track my movements in the room    Data Reviewed: There are no new results to review at this time.  Medically stable: Yes  Family Communication:  Wife Stephen Delgado on 03/27/2021   DVT Prophylaxis  ., Scd's  Heparin injection 5,000 units   Disposition: Status: Remains inpatient appropriate because:  Remains in a persistent vegetative state, trach dependent and currently has no funding for long-term  placement.  At this time disposition remains in flux with TOC communicating on a regular basis with long-term care facilities as well as with the patient's primary insurance company.     COVID vaccination status:  Unknown  Consultants: Neurology PCCM Medicine   Procedures: Echocardiogram EEG Cortrack PEG tube placement Tracheostomy tube placement   Planned Discharge Destination:  Home with Home Health     Time spent: 15 minutes  Author: Erin Hearing, NP 04/01/2021 10:40 AM  For on call review [email or web address].

## 2021-04-01 NOTE — Progress Notes (Signed)
CSW spoke with Efraim Kaufmann at [facility] who states the facility does not accept patient's who do not require any ventilator support.  ? ?[name], MSW, LCSW ?Transitions of Care  Clinical Social Worker II ?[phone] ? ?

## 2021-04-02 LAB — GLUCOSE, CAPILLARY
Glucose-Capillary: 149 mg/dL — ABNORMAL HIGH (ref 70–99)
Glucose-Capillary: 165 mg/dL — ABNORMAL HIGH (ref 70–99)
Glucose-Capillary: 186 mg/dL — ABNORMAL HIGH (ref 70–99)
Glucose-Capillary: 169 mg/dL — ABNORMAL HIGH (ref 70–99)
Glucose-Capillary: 154 mg/dL — ABNORMAL HIGH (ref 70–99)

## 2021-04-02 NOTE — TOC Progression Note (Addendum)
Transition of Care (TOC) - Progression Note  ? ? ?Patient Details  ?Name: Stephen Delgado ?MRN: [id number] ?Date of Birth: Nov 12, 1965 ? ?Transition of Care (TOC) CM/SW Contact  ?[name], RN ?Phone Number: ?04/02/2021, 8:00 AM ? ?Clinical Narrative:    ?CM spoke with Essie Hart, CM at [facility] in [place] and the facility is currently reviewing the patient's clinicals for a likely bed offer at the facility for LTC.  I received a call from [place] CM, [employer]. RNCM and she has been in communication with the facility as well to determine if [facility] is willing to cover admission to the facility for the first two weeks of admission.  I called and left a message with [name], wife to update her on the phone regarding patient's potential bed offer at the facility and expectation from [place] CM that patient's family follow up with Marcelline Mates, DSS worker regarding require spend down on resources as determined by financial counseling note.  I also left a detailed message with the patient's son, [name], regarding the above for follow up communications with [facility], [place], and DSS for admission requirements. ? ?Message left with Essie Hart, CM at [facility] to offer support regarding family contacts or clinical information.  Will follow up.  Izola Price, CM at the [facility] states that she is verifying insurance availability and has placed a call to the Medicaid SW to verify information but will follow up with [facility] Team and [employer] during the process for needed information.  I plan to updated the patient's wife in person today since she is unable to have her cell phone availability at work during the day. ? ?CM and MSW with DTP Team will continue to follow the patient for SNF placement - [facility] pending at this time for review of assets and pending Medicaid determination. ? ? ?Expected Discharge Plan: Home w Home Health Services ?Barriers to Discharge: Continued Medical  Work up, No SNF bed ? ?Expected Discharge Plan and Services ?Expected Discharge Plan: Home w Home Health Services ?In-house Referral: Clinical Social Work, Hospice / Palliative Care, PCP / Health Connect ?Discharge Planning Services: CM Consult ?Post Acute Care Choice: Home Health ?Living arrangements for the past 2 months: Single Family Home ?                ?DME Arranged: Hospital bed, Trach supplies, Tube feeding, Oxygen ?DME Agency: [employer] ?Date DME Agency Contacted: 03/18/21 ?Time DME Agency Contacted: 1000 ?Representative spoke with at DME Agency: Jenness Corner, CM with [employer] ?  ?  ?  ?  ?  ? ? ?Social Determinants of Health (SDOH) Interventions ?  ? ?Readmission Risk Interventions ?Readmission Risk Prevention Plan 02/20/2021  ?Transportation Screening Complete  ?Medication Review Oceanographer) Complete  ?PCP or Specialist appointment within 3-5 days of discharge Not Complete  ?PCP/Specialist Appt Not Complete comments Patient will need LTC placement at SNF facility.  ?HRI or Home Care Consult Complete  ?SW Recovery Care/Counseling Consult Complete  ?Palliative Care Screening Complete  ?Skilled Nursing Facility Complete  ?Some recent data might be hidden  ? ? ?

## 2021-04-02 NOTE — Progress Notes (Signed)
Progress Note   Patient: Stephen Delgado [id number] DOB: [birth year] DOA: 12/30/2020     93 DOS: the patient was seen and examined on 04/02/2021      Brief hospital course: 56 y.o. male with PMH significant for DM2, HTN, CKD with questionable compliance to medications Patient presented to the ED on 12/30/2020 with complaint of headache, altered mental status   In the ED, he was hypertensive to 238/160, agitated, required restraints CT head showed large amount of intraventricular hemorrhage involving lateral/third/fourth ventricle, ICH and left basal ganglia as well as chronic microvascular ischemic changes. He was started on Cleviprex drip and admitted to neuro ICU   He subsequently had CT head repeated on 11/30, 12/1 and 12/3, all demonstrated unchanged intraparenchymal hemorrhage and intraventricular hemorrhage 12/3, MRI brain from also showed numerous small acute infarcts in both cerebral hemisphere with minimal improvement in the right superior cerebellum 12/3, intubated 12/13, tracheostomy 12/15, wean from vent to trach collar 12/16, PEG tube placement 12/16, repeat MRI showed an increase in the size and number of acute infarcts in the bilateral hemispheres with increased involvement in the cerebellum bilaterally.   12/18, transferred from neuro service to [facility]  12/21, palliative care consulted.  Family wanted to continue aggressive care as full CODE STATUS. 12/23 developed Cheyne-Stokes respiratory pattern with respiratory alkalosis.  Transferred to ICU for short-term mechanical ventilation and was found to have new tracheobronchitis now on cefepime 12/25 back to progressive floor and to [facility]   His hospital course has been complicated by significant neurological impairment, aspiration events, AKI, hypernatremia, urinary retention. 1/9 trach changed to #6.0 Shiley cuffless--1/10 > 30 days since trach inserted.  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   Mobility Assessment (last 72 hours)     Mobility Assessment     Row Name 03/29/21 2000 03/29/21 1800 03/29/21 1400 03/29/21 1200 03/29/21 0800   Does patient have an order for bedrest or is patient medically unstable Yes- Bedfast (Level 1) - Complete Yes- Bedfast (Level 1) - Complete Yes- Bedfast (Level 1) - Complete Yes- Bedfast (Level 1) - Complete Yes- Bedfast (Level 1) - Complete   What is the highest level of mobility based on the progressive mobility assessment? Level 1 (Bedfast) - Unable to balance while sitting on edge of bed -- Level 1 (Bedfast) - Unable to balance while sitting on edge of bed Level 1 (Bedfast) - Unable to balance while sitting on edge of bed Level 1 (Bedfast) - Unable to balance while sitting on edge of bed   Is the above level different from baseline mobility prior to current illness? No - Consider discontinuing PT/OT -- Yes - Recommend PT order Yes - Recommend PT order Yes - Recommend PT order    [place] Name 03/28/21 2000 03/28/21 0800         Does patient have an order for bedrest or is patient medically unstable Yes- Bedfast (Level 1) - Complete Yes- Bedfast (Level 1) - Complete      What is the highest level of mobility based on the progressive mobility assessment? Level 1 (Bedfast) - Unable to balance while sitting on edge of bed Level 1 (  Bedfast) - Unable to balance while sitting on edge of bed      Is the above level different from baseline mobility prior to current illness? 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 (recent MSSA and serratia pneumonia; recent Klebsiella tracheobronchitis);- (present on admission) Continue routine trach care Does not require suctioning   Acute hypernatremia secondary to laxative induced diarrhea Recent episode 2/2 volume loss from diarrhea after aggressive bowel prep for severe obstipation Na 141 on 3/1 Resolved Decrease free H20 back to 200 cc Resume daily miralax and if tolerates can resume BID colace  Malignant hypertension- (present on admission) Continue Norvasc Coreg, hydralazine, and clonidine   Acute kidney injury on CKD 3b/acute azotemia with mild hypernatremia- (present on admission) -Baseline renal function: BUN 18 and cr 1.49 -Recent cr 1.06 -Na stable-decrease free water to 200 cc q 4 hrs -Repeat BMET in am  Diabetes mellitus type 2 in obese (HCC) -A1c 6.3 in 12/31/2020 -Continue SSI every 4 hours -CBGs well controlled  Left scleral lesion/scleral hemorrhage-resolved as of 04/01/2021 Resolved     Transaminitis -2/2 Zyprexa -Resolved  Pneumonia due to Klebsiella pneumoniae (HCC)-resolved as of 04/01/2021 Resolved  Protein-calorie malnutrition/dysphagia due to recent stroke/constipation Nutrition Problem: Inadequate oral intake Etiology: lethargy/confusion, dysphagia Signs/Symptoms: NPO status Interventions: Tube feeding via PEG Body mass index is 23.33 kg/m.  Continue Glucerna     Acute urinary retention-resolved as of 03/30/2021 -Continue Urecholine -Foley catheter discontinued on 1/11 and patient voiding easily  MSSA (methicillin susceptible Staphylococcus aureus) pneumonia (HCC)-resolved as of 04/02/2021 Resolved  Pressure injury of skin-resolved as of 04/02/2021 Resolved-prior anus ulcer  Pneumonia due to Serratia marcescens (HCC)-resolved as of 04/01/2021 Resolved  Hypernatremia Sodium still increasing, it is 153 this morning despite  increasing his free water via PEG, I will go ahead and increase it further to 250 cc every 2 hours, will monitor BMP in a.m., and if remains elevated then will start on D5W. -I did discontinue all his laxatives as he has been having diarrhea.  Constipation Constipation has resolved, patient currently with diarrhea, lactulose discontinued        Subjective:    Physical Exam: Vitals:   04/01/21 2359 04/02/21 0346 04/02/21 0406 04/02/21 0830  BP: (!) 157/89  129/75 140/70  Pulse: 92 88 94 93  Resp: 19 18 20 20   Temp: 98.9 F (37.2 C)  99.4 F (37.4 C) 98 F (36.7 C)  TempSrc: Oral  Oral   SpO2: 100% 100% 96% 100%  Weight:      Height:       Constitutional: No acute distress, unresponsive but did open eyes to voice. Respiratory: #6.0 Shiley cuffless trach, room air- lung sounds CTA bilaterally--O2 sats between 100% -normal respiratory pattern  Cardiovascular: S1-S2, NSR, normotensive -no bilateral lower extremity edema  Abdomen:  Abdomen soft and nontender.  PEG tube -normoactive bowels -LBM 2/27 Neurologic: Upper extremities are flaccid to PROM.-No further hypertonicity of lower extremities elicited with PROM.  Psychiatric: Opens eyes to voice- did not track my movements in the room or make eye contact    Data Reviewed: There are no new results to review at this time.  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  At this time disposition remains in flux with TOC communicating on a regular basis with long-term care facilities as well as with the patient's primary insurance company.     COVID vaccination status:  Unknown  Consultants: Neurology PCCM Medicine    Procedures: Echocardiogram EEG Cortrack PEG tube placement Tracheostomy tube placement   Planned Discharge Destination:  Skilled nursing facility 3/1 [facility] SNF has offered a bed- awaiting insurance auth-wife needs to FU w/ Medicaid re apparent need to spend down assets to  allow pt to be eligible for Medicaid     Time spent: 15 minutes  Author: Erin Hearing, NP 04/02/2021 9:59 AM  For on call review [email or web address].

## 2021-04-03 LAB — GLUCOSE, CAPILLARY
Glucose-Capillary: 133 mg/dL — ABNORMAL HIGH (ref 70–99)
Glucose-Capillary: 144 mg/dL — ABNORMAL HIGH (ref 70–99)
Glucose-Capillary: 157 mg/dL — ABNORMAL HIGH (ref 70–99)
Glucose-Capillary: 158 mg/dL — ABNORMAL HIGH (ref 70–99)
Glucose-Capillary: 161 mg/dL — ABNORMAL HIGH (ref 70–99)
Glucose-Capillary: 163 mg/dL — ABNORMAL HIGH (ref 70–99)
Glucose-Capillary: 173 mg/dL — ABNORMAL HIGH (ref 70–99)

## 2021-04-03 LAB — BASIC METABOLIC PANEL
Anion gap: 11 (ref 5–15)
BUN: 46 mg/dL — ABNORMAL HIGH (ref 6–20)
CO2: 28 mmol/L (ref 22–32)
Calcium: 9.6 mg/dL (ref 8.9–10.3)
Chloride: 107 mmol/L (ref 98–111)
Creatinine, Ser: 0.88 mg/dL (ref 0.61–1.24)
GFR, Estimated: >60 mL/min (ref >60)
Glucose, Bld: 168 mg/dL — ABNORMAL HIGH (ref 70–99)
Potassium: 3.6 mmol/L (ref 3.5–5.1)
Sodium: 146 mmol/L — ABNORMAL HIGH (ref 135–145)

## 2021-04-03 MED ORDER — FREE WATER
200.0000 mL | Status: DC
  Administered 2021-04-03 – 2021-04-09 (×77): 200 mL

## 2021-04-03 NOTE — Plan of Care (Signed)
?  Problem: Clinical Measurements: ?Goal: Ability to maintain clinical measurements within normal limits will improve ?Outcome: Progressing ?Goal: Will remain free from infection ?Outcome: Progressing ?Goal: Diagnostic test results will improve ?Outcome: Progressing ?Goal: Respiratory complications will improve ?Outcome: Progressing ?Goal: Cardiovascular complication will be avoided ?Outcome: Progressing ?  ?Problem: Nutrition: ?Goal: Adequate nutrition will be maintained ?Outcome: Progressing ?  ?Problem: Coping: ?Goal: Level of anxiety will decrease ?Outcome: Progressing ?  ?Problem: Elimination: ?Goal: Will not experience complications related to urinary retention ?Outcome: Progressing ?  ?Problem: Pain Managment: ?Goal: General experience of comfort will improve ?Outcome: Progressing ?  ?Problem: Safety: ?Goal: Ability to remain free from injury will improve ?Outcome: Progressing ?  ?Problem: Skin Integrity: ?Goal: Risk for impaired skin integrity will decrease ?Outcome: Progressing ?  ?Problem: Nutrition: ?Goal: Risk of aspiration will decrease ?Outcome: Progressing ?  ?Problem: Intracerebral Hemorrhage Tissue Perfusion: ?Goal: Complications of Intracerebral Hemorrhage will be minimized ?Outcome: Progressing ?  ?

## 2021-04-03 NOTE — TOC Progression Note (Signed)
Transition of Care (TOC) - Progression Note  ? ? ?Patient Details  ?Name: Stephen Delgado ?MRN: [id number] ?Date of Birth: 04-30-1965 ? ?Transition of Care (TOC) CM/SW Contact  ?[name], RN ?Phone Number: ?04/03/2021, 8:19 AM ? ?Clinical Narrative:    ?CM spoke with the patient's son, Cainan Trull, on the phone to discuss pending bed offer and review of Medicaid application and possible placement at [facility] in [place].  The patient's son is agreeable and hopeful for placement at a nursing facility and will discuss details with the patient's family at wife concerning financial duties to spend down assets at request of financial counseling and DSS.  The patient's son states that he is aware of the spend down requirements and has been working with extended family to decrease the asset liability. ? ?Jaidev Sanger was given information that the facility case manager may likely contact family for further information regarding financials for possible placement. ? ?CM and MSW with DTP Team will continue to follow the patient for SNF placement. ? ? ?Expected Discharge Plan: Home w Home Health Services ?Barriers to Discharge: Continued Medical Work up, No SNF bed ? ?Expected Discharge Plan and Services ?Expected Discharge Plan: Home w Home Health Services ?In-house Referral: Clinical Social Work, Hospice / Palliative Care, PCP / Health Connect ?Discharge Planning Services: CM Consult ?Post Acute Care Choice: Home Health ?Living arrangements for the past 2 months: Single Family Home ?                ?DME Arranged: Hospital bed, Trach supplies, Tube feeding, Oxygen ?DME Agency: [employer] ?Date DME Agency Contacted: 03/18/21 ?Time DME Agency Contacted: 1000 ?Representative spoke with at DME Agency: Jenness Corner, CM with [employer] ?  ?  ?  ?  ?  ? ? ?Social Determinants of Health (SDOH) Interventions ?  ? ?Readmission Risk Interventions ?Readmission Risk Prevention Plan 02/20/2021  ?Transportation Screening  Complete  ?Medication Review Oceanographer) Complete  ?PCP or Specialist appointment within 3-5 days of discharge Not Complete  ?PCP/Specialist Appt Not Complete comments Patient will need LTC placement at SNF facility.  ?HRI or Home Care Consult Complete  ?SW Recovery Care/Counseling Consult Complete  ?Palliative Care Screening Complete  ?Skilled Nursing Facility Complete  ?Some recent data might be hidden  ? ? ?

## 2021-04-03 NOTE — Progress Notes (Signed)
Progress Note   Patient: Stephen Delgado [id number] DOB: [birth year] DOA: 12/30/2020     94 DOS: the patient was seen and examined on 04/03/2021      Brief hospital course: 56 y.o. male with PMH significant for DM2, HTN, CKD with questionable compliance to medications Patient presented to the ED on 12/30/2020 with complaint of headache, altered mental status   In the ED, he was hypertensive to 238/160, agitated, required restraints CT head showed large amount of intraventricular hemorrhage involving lateral/third/fourth ventricle, ICH and left basal ganglia as well as chronic microvascular ischemic changes. He was started on Cleviprex drip and admitted to neuro ICU   He subsequently had CT head repeated on 11/30, 12/1 and 12/3, all demonstrated unchanged intraparenchymal hemorrhage and intraventricular hemorrhage 12/3, MRI brain from also showed numerous small acute infarcts in both cerebral hemisphere with minimal improvement in the right superior cerebellum 12/3, intubated 12/13, tracheostomy 12/15, wean from vent to trach collar 12/16, PEG tube placement 12/16, repeat MRI showed an increase in the size and number of acute infarcts in the bilateral hemispheres with increased involvement in the cerebellum bilaterally.   12/18, transferred from neuro service to [facility]  12/21, palliative care consulted.  Family wanted to continue aggressive care as full CODE STATUS. 12/23 developed Cheyne-Stokes respiratory pattern with respiratory alkalosis.  Transferred to ICU for short-term mechanical ventilation and was found to have new tracheobronchitis now on cefepime 12/25 back to progressive floor and to [facility]   His hospital course has been complicated by significant neurological impairment, aspiration events, AKI, hypernatremia, urinary retention. 1/9 trach changed to #6.0 Shiley cuffless--1/10 > 30 days since trach inserted.  Assessment and Plan: Brain injury 2/2 acute intraparenchymal and  intraventricular hemorrhage/Bilateral multifocal ischemic infarcts with assoc muscle hypertonicity  -Respiratory status has been more stable without recurrent episodes of Cheyne-Stokes pattern -Continue Zyprexa, baclofen, Symmetrel and scheduled Oxy IR  -No real progress per OT and they have signed off -OPens eyes to either voice or tactile stimulation  A physical therapy consult is indicated based on the patients mobility assessment.   Mobility Assessment (last 72 hours)     Mobility Assessment     Row Name 03/29/21 2000 03/29/21 1800 03/29/21 1400 03/29/21 1200 03/29/21 0800   Does patient have an order for bedrest or is patient medically unstable Yes- Bedfast (Level 1) - Complete Yes- Bedfast (Level 1) - Complete Yes- Bedfast (Level 1) - Complete Yes- Bedfast (Level 1) - Complete Yes- Bedfast (Level 1) - Complete   What is the highest level of mobility based on the progressive mobility assessment? Level 1 (Bedfast) - Unable to balance while sitting on edge of bed -- Level 1 (Bedfast) - Unable to balance while sitting on edge of bed Level 1 (Bedfast) - Unable to balance while sitting on edge of bed Level 1 (Bedfast) - Unable to balance while sitting on edge of bed   Is the above level different from baseline mobility prior to current illness? No - Consider discontinuing PT/OT -- Yes - Recommend PT order Yes - Recommend PT order Yes - Recommend PT order    [name] Name 03/28/21 2000 03/28/21 0800         Does patient have an order for bedrest or is patient medically unstable Yes- Bedfast (Level 1) - Complete Yes- Bedfast (Level 1) - Complete      What is the highest level of mobility based on the progressive mobility assessment? Level 1 (Bedfast) - Unable to balance  while sitting on edge of bed Level 1 (Bedfast) - Unable to balance while sitting on edge of bed      Is the above level different from baseline mobility prior to current illness? 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 (recent MSSA and serratia pneumonia; recent Klebsiella tracheobronchitis); Continue routine trach care Does not require suctioning   Acute hypernatremia secondary to laxative induced diarrhea Recent episode 2/2 volume loss from diarrhea after aggressive bowel prep for severe obstipation Na 141 on 3/1 Had resolved but after frequency of free H2O boluses decreased Na back up to 146 w/ concurrent mild elevation in BUN 3/3 Increase free H2O to q 2 hrs Continue daily miralax    Malignant hypertension Continue Norvasc Coreg, hydralazine, and clonidine   Acute kidney injury on CKD 3b/acute azotemia with mild hypernatremia -Baseline renal function: BUN 18 and cr 1.49 -Recent cr 1.06 -Na stable-decrease free water to 200 cc q 4 hrs -Repeat BMET in am  Diabetes mellitus type 2 in obese (HCC) -A1c 6.3 in 12/31/2020 -Continue SSI every 4 hours -CBGs well controlled       Left scleral lesion/scleral hemorrhage-resolved as of 04/01/2021 Resolved     Transaminitis -2/2 Zyprexa -Resolved  Pneumonia due to Klebsiella pneumoniae (HCC)-resolved as of 04/01/2021 Resolved  Protein-calorie malnutrition/dysphagia due to recent stroke/constipation Nutrition Problem: Inadequate oral intake Etiology: lethargy/confusion, dysphagia Signs/Symptoms: NPO status Interventions: Tube feeding via PEG Body mass index is 23.33 kg/m.  Continue Glucerna     Acute urinary retention-resolved as of 03/30/2021 -Continue Urecholine -Foley catheter discontinued on 1/11 and patient voiding easily  MSSA (methicillin susceptible Staphylococcus aureus) pneumonia (HCC)-resolved as of 04/02/2021 Resolved  Pressure injury of skin-resolved as of 04/02/2021 Resolved-prior anus ulcer  Pneumonia due to Serratia marcescens (HCC)-resolved as of  04/01/2021 Resolved  Constipation Constipation has resolved, patient currently with diarrhea, lactulose discontinued        Subjective:  Essentially unresponsive.  Did not open eyes to voice today but did open eyes to tactile stimulation with no focusing or tracking  Physical Exam: Vitals:   04/03/21 0417 04/03/21 0420 04/03/21 0730 04/03/21 0750  BP:  (!) 156/83  (!) 141/78  Pulse: 96 97 94 95  Resp: 18 19 14 20   Temp:  98.8 F (37.1 C)  98.6 F (37 C)  TempSrc:  Oral  Axillary  SpO2: 95% 94% 97% 97%  Weight:      Height:       Constitutional: No acute distress, unresponsive except to tactile stimulation with no meaningful interaction Respiratory: #6.0 Shiley cuffless trach, room air- lung sounds CTA bilaterally--O2 sats between 94 and 99%-normal respiratory pattern  Cardiovascular: S1-S2, NSR, normotensive -no bilateral lower extremity edema  Abdomen:  Abdomen soft and nontender.  PEG tube -normoactive bowels -LBM 3/2 Neurologic: Upper extremities are flaccid to PROM.-No further hypertonicity of lower extremities elicited with PROM.  Psychiatric: Opens eyes to voice- did not track my movements in the room or make eye contact    Data Reviewed: There are no new results to review at this time.  Medically stable: Yes  Family Communication:  Wife [name] on 2/28   DVT Prophylaxis  ., Scd's  Heparin injection 5,000 units   Disposition: Status: Remains inpatient appropriate because:  Remains in a persistent vegetative state, trach dependent and currently has no funding for long-term placement.  At this time disposition remains in flux with TOC communicating on a regular basis with long-term care facilities as well as with the patient's primary insurance company.     COVID vaccination status:  Unknown  Consultants: Neurology PCCM Medicine   Procedures: Echocardiogram EEG Cortrack PEG tube placement Tracheostomy tube placement   Planned Discharge  Destination:  Skilled nursing facility [facility] SNF has offered a bed- awaiting insurance auth-wife needs to FU w/ Medicaid re apparent need to spend down assets to  allow pt to be eligible for Medicaid     Time spent: 15 minutes  Author: Erin Hearing, NP 04/03/2021 9:53 AM  For on call review [email or web address].

## 2021-04-03 NOTE — Procedures (Signed)
Tracheostomy Change Note ? ?Patient Details:   ?Name: Stephen Delgado ?DOB: 1965/02/19 ?MRN: [id number] ?   ?Airway Documentation: ?   ?Trach changed per protocol and per order.   ? ?Evaluation ? O2 sats: stable throughout ?Complications: No apparent complications ?Patient did tolerate procedure well. ?Bilateral Breath Sounds: Diminished, Clear ? ?+ easy cap color change (purple to yellow).  No distress noted and pt appeared to tol trach change well.  RN at bedside and aware.   ?  ? ?Jennette Kettle ?04/03/2021, 2:34 PM ?

## 2021-04-04 LAB — COMPREHENSIVE METABOLIC PANEL
ALT: 54 U/L — ABNORMAL HIGH (ref 0–44)
AST: 24 U/L (ref 15–41)
Albumin: 2.4 g/dL — ABNORMAL LOW (ref 3.5–5.0)
Alkaline Phosphatase: 59 U/L (ref 38–126)
Anion gap: 8 (ref 5–15)
BUN: 51 mg/dL — ABNORMAL HIGH (ref 6–20)
CO2: 27 mmol/L (ref 22–32)
Calcium: 9.6 mg/dL (ref 8.9–10.3)
Chloride: 109 mmol/L (ref 98–111)
Creatinine, Ser: 0.91 mg/dL (ref 0.61–1.24)
GFR, Estimated: >60 mL/min (ref >60)
Glucose, Bld: 154 mg/dL — ABNORMAL HIGH (ref 70–99)
Potassium: 3.7 mmol/L (ref 3.5–5.1)
Sodium: 144 mmol/L (ref 135–145)
Total Bilirubin: 0.3 mg/dL (ref 0.3–1.2)
Total Protein: 6.2 g/dL — ABNORMAL LOW (ref 6.5–8.1)

## 2021-04-04 LAB — CBC WITH DIFFERENTIAL/PLATELET
Abs Immature Granulocytes: 0.05 10*3/uL (ref 0.00–0.07)
Basophils Absolute: 0 10*3/uL (ref 0.0–0.1)
Basophils Relative: 0 %
Eosinophils Absolute: 0.4 10*3/uL (ref 0.0–0.5)
Eosinophils Relative: 4 %
HCT: 31.3 % — ABNORMAL LOW (ref 39.0–52.0)
Hemoglobin: 9.4 g/dL — ABNORMAL LOW (ref 13.0–17.0)
Immature Granulocytes: 1 %
Lymphocytes Relative: 16 %
Lymphs Abs: 1.8 10*3/uL (ref 0.7–4.0)
MCH: 23.8 pg — ABNORMAL LOW (ref 26.0–34.0)
MCHC: 30 g/dL (ref 30.0–36.0)
MCV: 79.2 fL — ABNORMAL LOW (ref 80.0–100.0)
Monocytes Absolute: 1 10*3/uL (ref 0.1–1.0)
Monocytes Relative: 9 %
Neutro Abs: 7.8 10*3/uL — ABNORMAL HIGH (ref 1.7–7.7)
Neutrophils Relative %: 70 %
Platelets: 214 10*3/uL (ref 150–400)
RBC: 3.95 MIL/uL — ABNORMAL LOW (ref 4.22–5.81)
RDW: 17.5 % — ABNORMAL HIGH (ref 11.5–15.5)
WBC: 11.1 10*3/uL — ABNORMAL HIGH (ref 4.0–10.5)
nRBC: 0 % (ref 0.0–0.2)

## 2021-04-04 LAB — GLUCOSE, CAPILLARY
Glucose-Capillary: 135 mg/dL — ABNORMAL HIGH (ref 70–99)
Glucose-Capillary: 157 mg/dL — ABNORMAL HIGH (ref 70–99)
Glucose-Capillary: 140 mg/dL — ABNORMAL HIGH (ref 70–99)
Glucose-Capillary: 148 mg/dL — ABNORMAL HIGH (ref 70–99)
Glucose-Capillary: 137 mg/dL — ABNORMAL HIGH (ref 70–99)

## 2021-04-04 NOTE — Progress Notes (Signed)
Came back for trach check, currently appears there is no new bloody sputum at this time.  No distress noted.   ?

## 2021-04-04 NOTE — Plan of Care (Signed)
?  Problem: Clinical Measurements: ?Goal: Will remain free from infection ?Outcome: Not Progressing ?  ?Problem: Nutrition: ?Goal: Adequate nutrition will be maintained ?Outcome: Not Progressing ?  ?Problem: Nutrition: ?Goal: Risk of aspiration will decrease ?Outcome: Not Progressing ?  ?Problem: Nutrition: ?Goal: Dietary intake will improve ?Outcome: Not Progressing ?  ?Problem: Intracerebral Hemorrhage Tissue Perfusion: ?Goal: Complications of Intracerebral Hemorrhage will be minimized ?Outcome: Not Progressing ?  ?

## 2021-04-04 NOTE — Progress Notes (Signed)
Patient with new blood tinged mucous from trach. First notified by RT during their last rounding and now with more productive bloody mucous. Patient seems to be more distressed than previously noted while coughing but oxygen saturations remain 98% and above. Respiratory therapy at bedside but no intervention needed. Inner cannula assessed and gauze around trach changed ?

## 2021-04-04 NOTE — Progress Notes (Signed)
Patient seen and examined.  56 year old gentleman with unfortunate devastating CNS hemorrhage now on trach and PEG remains in the hospital, day 94 today.  Waiting for placement.  Hemodynamically stable. ? ?Plan: No new changes today.  Medications optimized.  Waiting for placement. ? ?Total time spent: 20 minutes. ?

## 2021-04-04 NOTE — Progress Notes (Signed)
Last trach rounds, I noted a small amount of blood tinged sputum which was new for pt.  I notified RN at the time (see RN note).  ? ?Later, RN called d/t pt having productive cough w/ blood tinged sputum again and per RN seemed more distressed than earlier.  Once I came to room, pt appeared to be settled down w/ no distress noted but was having a productive cough w/ slight blood tinged sputum.  We suctioned orally to assess if patient possibly bit tongue.  Unable to visualize tongue d/t pt biting down, however there was a small amount of pink/blood tinged oral secretions noted.   ? ?Inner cannula assessed and patient tracheal suctioned gently once more, sputum was white/tan tinged last suction and no blood tinged sputum noted.   ? ?RN at bedside and per RN she as sent MD a secure chat re: this.  NO distress currently noted, sat 98%, RR 20-24 bpm.  ?

## 2021-04-05 LAB — GLUCOSE, CAPILLARY
Glucose-Capillary: 119 mg/dL — ABNORMAL HIGH (ref 70–99)
Glucose-Capillary: 146 mg/dL — ABNORMAL HIGH (ref 70–99)
Glucose-Capillary: 157 mg/dL — ABNORMAL HIGH (ref 70–99)
Glucose-Capillary: 162 mg/dL — ABNORMAL HIGH (ref 70–99)
Glucose-Capillary: 169 mg/dL — ABNORMAL HIGH (ref 70–99)
Glucose-Capillary: 170 mg/dL — ABNORMAL HIGH (ref 70–99)

## 2021-04-05 MED ORDER — POLYETHYLENE GLYCOL 3350 17 G PO PACK
17.0000 g | PACK | Freq: Every day | ORAL | Status: DC
  Administered 2021-04-05 – 2021-04-08 (×4): 17 g
  Filled 2021-04-05 (×4): qty 1

## 2021-04-05 NOTE — Progress Notes (Signed)
Patient seen and examined.  56 year old gentleman with CNS hemorrhage, 96 days in the hospital, unresponsive and dependent on trach and PEG waiting for placement.  Hemodynamically stable today. ?Patient had some blood-tinged secretion from the trach, stable after cleaned up. ? ?Plan: Continue close monitoring.  Waiting for placement.  No bowel movement for the last 2 days, add scheduled MiraLAX. ? ?Total time spent: 20 minutes. ?

## 2021-04-05 NOTE — Plan of Care (Signed)
?  Problem: Clinical Measurements: ?Goal: Will remain free from infection ?Outcome: Progressing ?  ?Problem: Nutrition: ?Goal: Adequate nutrition will be maintained ?Outcome: Progressing ?  ?Problem: Elimination: ?Goal: Will not experience complications related to bowel motility ?Outcome: Progressing ?  ?Problem: Nutrition: ?Goal: Risk of aspiration will decrease ?Outcome: Progressing ?  ?

## 2021-04-06 DIAGNOSIS — L8990 Pressure ulcer of unspecified site, unspecified stage: Secondary | ICD-10-CM | POA: Insufficient documentation

## 2021-04-06 HISTORY — DX: Pressure ulcer of unspecified site, unspecified stage: L89.90

## 2021-04-06 LAB — GLUCOSE, CAPILLARY
Glucose-Capillary: 134 mg/dL — ABNORMAL HIGH (ref 70–99)
Glucose-Capillary: 142 mg/dL — ABNORMAL HIGH (ref 70–99)
Glucose-Capillary: 146 mg/dL — ABNORMAL HIGH (ref 70–99)
Glucose-Capillary: 154 mg/dL — ABNORMAL HIGH (ref 70–99)
Glucose-Capillary: 161 mg/dL — ABNORMAL HIGH (ref 70–99)
Glucose-Capillary: 168 mg/dL — ABNORMAL HIGH (ref 70–99)
Glucose-Capillary: 172 mg/dL — ABNORMAL HIGH (ref 70–99)

## 2021-04-06 NOTE — Progress Notes (Signed)
Nutrition Follow-up ? ?DOCUMENTATION CODES:  ?Severe malnutrition in context of chronic illness ? ?INTERVENTION:  ?Obtain weights weekly  ? ?Continue TF via PEG: ?-Glucerna 1.5 @ goal rate of 45m/hr (14452mday)  ?-Free water per MD/NP, currently 20076mree water Q2H ? ?At goal, TF regimen will provide 2160 kcals, 118 grams protein, 1092m68mee water (3492ml2mal free water with flushes) ? ?Recommend continue 1 packet Juven BID, each packet provides 95 calories, 2.5 grams of protein (collagen), and 9.8 grams of carbohydrate (3 grams sugar); also contains 7 grams of L-arginine and L-glutamine, 300 mg vitamin C, 15 mg vitamin E, 1.2 mcg vitamin B-12, 9.5 mg zinc, 200 mg calcium, and 1.5 g  Calcium Beta-hydroxy-Beta-methylbutyrate to support wound healing ? ?NUTRITION DIAGNOSIS:  ?Severe Malnutrition related to chronic illness as evidenced by severe muscle depletion, severe fat depletion. -- ongoing ? ?GOAL:  ?Patient will meet greater than or equal to 90% of their needs -- addressing with TF, met when TF at goal ? ?MONITOR:  ?TF tolerance, Labs, I & O's, Skin ? ?REASON FOR ASSESSMENT:  ?Ventilator, Consult ?Enteral/tube feeding initiation and management ? ?ASSESSMENT:  ?55 ye76 old male who presented to the ED on 11/29 with AMS. PMH of HTN, T2DM, CKD stage III. 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 ? ?11/30 cortrak placed; tip gastric ?12/2 trickle TF ?12/3 pt intubated due to increased WOB, +emesis, TF held ?12/4 trickle TF restarted ?12/5 advancement orders placed ?12/9 TF adjusted ?12/13 s/p tracheostomy ?12/16 s/p EGD and PEG placement ?12/19 tx out of ICU to TRH  [facility]/23 developed Cheyne-Stokes respiratory pattern with respiratory alkalosis.Transferred to ICU for short-term mechanical ventilation and was found to have new tracheobronchitis now on cefepime ?12/25 tx back to TRH ?[place] trach changed to #6 shiley cuffless  ?1/10 marks 30 days since trach  placed ?2/17 trach changed   ?3/03 trach changed  ? ?Pt continues to be minimally responsive, tolerating ATC and continues to be NPO requiring TF via PEG. Pt returned to continuous TF rate as of 2/23 and advanced to goal as of 2/24. Pt tolerating the following TF via PEG: Glucerna 1.5 @ 60ml/16m/ 200ml f66mwater Q2H. Pt seems to be fluctuating between diarrhea and constipation per NP ?  ?UOP: 1700ml x2100murs ?I/O: +4575ml sin43mdmit ? ?Admit wt 85.2 kg ?Current wt 83.5 kg - last updated 2/13 ?Pt needs new weight ?  ?Medications: protonix, SSI Q4H, juven BID, miralax ?Labs reviewed ?CBGs: 119-170 x 24 hours ? ?Diet Order:   ?Diet Order   ? ?       ?  Diet NPO time specified  Diet effective midnight       ?  ? ?  ?  ? ?  ? ?EDUCATION NEEDS:  ?Not appropriate for education at this time ? ?Skin:  Skin Assessment: Reviewed RN Assessment ?Skin Integrity Issues:: Stage II ?Stage II: anus ? ?Last BM:  3/3 type 5 ? ?Height:  ?Ht Readings from Last 1 Encounters:  ?01/23/21 6' (1.829 m)  ? ?Weight:  ?Wt Readings from Last 1 Encounters:  ?03/16/21 83.5 kg  ? ?BMI:  Body mass index is 24.95 kg/m?. ? ?Estimated Nutritional Needs:  ?Kcal:  2150-2350 ?Protein:  105-120 grams ?Fluid:  >2L ? ? ?Angeline Trick A.[name], LDN (she/her/hers) ?RD pager number and weekend/on-call pager number located in Amion. ? [place]

## 2021-04-06 NOTE — Plan of Care (Signed)
  Problem: Nutrition: Goal: Adequate nutrition will be maintained Outcome: Progressing   Problem: Elimination: Goal: Will not experience complications related to bowel motility Outcome: Progressing   Problem: Safety: Goal: Ability to remain free from injury will improve Outcome: Progressing   

## 2021-04-06 NOTE — Plan of Care (Signed)
?  Problem: Health Behavior/Discharge Planning: ?Goal: Ability to manage health-related needs will improve ?Outcome: Progressing ?  ?Problem: Clinical Measurements: ?Goal: Ability to maintain clinical measurements within normal limits will improve ?Outcome: Progressing ?Goal: Will remain free from infection ?Outcome: Progressing ?Goal: Diagnostic test results will improve ?Outcome: Progressing ?Goal: Respiratory complications will improve ?Outcome: Progressing ?Goal: Cardiovascular complication will be avoided ?Outcome: Progressing ?  ?Problem: Activity: ?Goal: Risk for activity intolerance will decrease ?Outcome: Progressing ?  ?Problem: Nutrition: ?Goal: Adequate nutrition will be maintained ?Outcome: Progressing ?  ?Problem: Coping: ?Goal: Level of anxiety will decrease ?Outcome: Progressing ?  ?Problem: Elimination: ?Goal: Will not experience complications related to bowel motility ?Outcome: Progressing ?Goal: Will not experience complications related to urinary retention ?Outcome: Progressing ?  ?Problem: Pain Managment: ?Goal: General experience of comfort will improve ?Outcome: Progressing ?  ?Problem: Safety: ?Goal: Ability to remain free from injury will improve ?Outcome: Progressing ?  ?Problem: Skin Integrity: ?Goal: Risk for impaired skin integrity will decrease ?Outcome: Progressing ?  ?Problem: Education: ?Goal: Knowledge of disease or condition will improve ?Outcome: Progressing ?Goal: Knowledge of secondary prevention will improve (SELECT ALL) ?Outcome: Progressing ?Goal: Knowledge of patient specific risk factors will improve (INDIVIDUALIZE FOR PATIENT) ?Outcome: Progressing ?Goal: Individualized Educational Video(s) ?Outcome: Progressing ?  ?Problem: Coping: ?Goal: Will verbalize positive feelings about self ?Outcome: Progressing ?Goal: Will identify appropriate support needs ?Outcome: Progressing ?  ?Problem: Health Behavior/Discharge Planning: ?Goal: Ability to manage health-related needs will  improve ?Outcome: Progressing ?  ?Problem: Self-Care: ?Goal: Ability to participate in self-care as condition permits will improve ?Outcome: Progressing ?Goal: Verbalization of feelings and concerns over difficulty with self-care will improve ?Outcome: Progressing ?Goal: Ability to communicate needs accurately will improve ?Outcome: Progressing ?  ?Problem: Nutrition: ?Goal: Risk of aspiration will decrease ?Outcome: Progressing ?Goal: Dietary intake will improve ?Outcome: Progressing ?  ?Problem: Intracerebral Hemorrhage Tissue Perfusion: ?Goal: Complications of Intracerebral Hemorrhage will be minimized ?Outcome: Progressing ?  ?

## 2021-04-06 NOTE — TOC Progression Note (Addendum)
Transition of Care (TOC) - Progression Note  ? ? ?Patient Details  ?Name: Stephen Delgado ?MRN: [id number] ?Date of Birth: 1965/02/22 ? ?Transition of Care (TOC) CM/SW Contact  ?[name], RN ?Phone Number: ?04/06/2021, 10:18 AM ? ?Clinical Narrative:    ?CM called and left a message with Essie Hart, CM with [facility] SN[facility].  The facility is continuing to review the patient for possible admission to the facility.  The patient's family is aware that the facility will communicate with them regarding needed financial information - pending Medicaid application. ? ?04/06/2021 1025 - CM spoke with Essie Hart, CM with [facility] Izola Price in communication with [place] regarding needed insurance information.  Izola Price, CM states that she will follow up with me on 04/07/2021 for update. ? ?CM and MSW with DTP Team will continue to follow the patient for SNF placement. ? ? ?Expected Discharge Plan: Home w Home Health Services ?Barriers to Discharge: Continued Medical Work up, No SNF bed ? ?Expected Discharge Plan and Services ?Expected Discharge Plan: Home w Home Health Services ?In-house Referral: Clinical Social Work, Hospice / Palliative Care, PCP / Health Connect ?Discharge Planning Services: CM Consult ?Post Acute Care Choice: Home Health ?Living arrangements for the past 2 months: Single Family Home ?                ?DME Arranged: Hospital bed, Trach supplies, Tube feeding, Oxygen ?DME Agency: [employer] ?Date DME Agency Contacted: 03/18/21 ?Time DME Agency Contacted: 1000 ?Representative spoke with at DME Agency: Jenness Corner, CM with [employer] ?  ?  ?  ?  ?  ? ? ?Social Determinants of Health (SDOH) Interventions ?  ? ?Readmission Risk Interventions ?Readmission Risk Prevention Plan 02/20/2021  ?Transportation Screening Complete  ?Medication Review Oceanographer) Complete  ?PCP or Specialist appointment within 3-5 days of discharge Not Complete  ?PCP/Specialist Appt Not Complete comments Patient will need LTC  placement at SNF facility.  ?HRI or Home Care Consult Complete  ?SW Recovery Care/Counseling Consult Complete  ?Palliative Care Screening Complete  ?Skilled Nursing Facility Complete  ?Some recent data might be hidden  ? ? ?

## 2021-04-06 NOTE — Progress Notes (Signed)
? ?Progress Note ? ? ?Patient: Stephen Delgado [id number] DOB: 1965/10/07 DOA: 12/30/2020     97 ?DOS: the patient was seen and examined on 04/06/2021 ? ? ? ?  ?Brief hospital course: ?56 y.o. male with PMH significant for DM2, HTN, CKD with questionable compliance to medications ?Patient presented to the ED on 12/30/2020 with complaint of headache, altered mental status ?  ?In the ED, he was hypertensive to 238/160, agitated, required restraints ?CT head showed large amount of intraventricular hemorrhage involving lateral/third/fourth ventricle, ICH and left basal ganglia as well as chronic microvascular ischemic changes. ?He was started on Cleviprex drip and admitted to neuro ICU ?  ?He subsequently had CT head repeated on 11/30, 12/1 and 12/3, all demonstrated unchanged intraparenchymal hemorrhage and intraventricular hemorrhage ?12/3, MRI brain from also showed numerous small acute infarcts in both cerebral hemisphere with minimal improvement in the right superior cerebellum ?12/3, intubated ?12/13, tracheostomy ?12/15, wean from vent to trach collar ?12/16, PEG tube placement ?12/16, repeat MRI showed an increase in the size and number of acute infarcts in the bilateral hemispheres with increased involvement in the cerebellum bilaterally.   ?12/18, transferred [facility] neuro service to [facility]  ?12/21, palliative care consulted.  Family wanted to continue aggressive care as full CODE STATUS. ?12/23 developed Cheyne-Stokes respiratory pattern with respiratory alkalosis.  Transferred to ICU for short-term mechanical ventilation and was found to have new tracheobronchitis now on cefepime ?12/25 back to progressive floor and to TRH ?  ?His hospital course has been complicated by significant neurological impairment, aspiration events, AKI, hypernatremia, urinary retention. ?1/9 trach changed to #6.0 Shiley cuffless--1/10 > 30 days since trach inserted. ? ?Assessment and Plan: ?Brain injury 2/2 acute intraparenchymal and  intraventricular hemorrhage/Bilateral multifocal ischemic infarcts with assoc muscle hypertonicity  ?-Respiratory status has been more stable without recurrent episodes of Cheyne-Stokes pattern ?-Continue Zyprexa, baclofen, Symmetrel and scheduled Oxy IR  ?-No real progress per OT and they have signed off ?-OPens eyes to either voice or tactile stimulation ? ?A physical therapy consult is indicated based on the patient?s mobility assessment. ? ? ?Mobility Assessment (last 72 hours)   ? ? Mobility Assessment   ? ? [name] 03/29/21 2000 03/29/21 1800 03/29/21 1400 03/29/21 1200 03/29/21 0800  ? Does patient have an order for bedrest or is patient medically unstable Yes- Bedfast (Level 1) - Complete Yes- Bedfast (Level 1) - Complete Yes- Bedfast (Level 1) - Complete Yes- Bedfast (Level 1) - Complete Yes- Bedfast (Level 1) - Complete  ? What is the highest level of mobility based on the progressive mobility assessment? Level 1 (Bedfast) - Unable to balance while sitting on edge of bed -- Level 1 (Bedfast) - Unable to balance while sitting on edge of bed Level 1 (Bedfast) - Unable to balance while sitting on edge of bed Level 1 (Bedfast) - Unable to balance while sitting on edge of bed  ? Is the above level different from baseline mobility prior to current illness? No - Consider discontinuing PT/OT -- Yes - Recommend PT order Yes - Recommend PT order Yes - Recommend PT order  ? ? [name] 03/28/21 2000 03/28/21 0800  ?  ?  ?  ? Does patient have an order for bedrest or is patient medically unstable Yes- Bedfast (Level 1) - Complete Yes- Bedfast (Level 1) - Complete     ? What is the highest level of mobility based on the progressive mobility assessment? Level 1 (Bedfast) - Unable to balance  while sitting on edge of bed Level 1 (Bedfast) - Unable to balance while sitting on edge of bed     ? Is the above level different from baseline mobility prior to current illness? No - Consider discontinuing PT/OT No - Consider  discontinuing PT/OT     ? ?  ?  ? ?  ? ? ? 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 ? ?Acute hypernatremia secondary to laxative induced diarrhea ?Recent episode 2/2 volume loss from diarrhea after aggressive bowel prep for severe obstipation ?Na 141 on 3/1 ?Had resolved but after frequency of free H2O boluses decreased Na back up to 146 w/ concurrent mild elevation in BUN ?3/3 Increase free H2O to q 2 hrs ?Continue daily miralax  ? ? ?Malignant hypertension ?Continue Norvasc Coreg, hydralazine, and clonidine ? ? ?Acute kidney injury on CKD 3b/acute azotemia with mild hypernatremia ?-Baseline renal function: BUN 18 and cr 1.49 ?-Recent cr 1.06 ?-Na stable-decrease free water to 200 cc q 4 hrs ?-Repeat BMET in am ? ?Diabetes mellitus type 2 in obese (HCC) ?-A1c 6.3 in 12/31/2020 ?-Continue SSI every 4 hours ?-CBGs well controlled ? ? ? ?  ? ?Left scleral lesion/scleral hemorrhage-resolved as of 04/01/2021 ?Resolved ? ? ? ? ?Transaminitis ?-2/2 Zyprexa ?-Resolved ? ?Pneumonia due to Klebsiella pneumoniae (HCC)-resolved as of 04/01/2021 ?Resolved ? ?Protein-calorie malnutrition/dysphagia due to recent stroke/constipation ?Nutrition Problem: Inadequate oral intake ?Etiology: lethargy/confusion, dysphagia ?Signs/Symptoms: NPO status ?Interventions: Tube feeding via PEG ?Body mass index is 23.33 kg/m?Marland Kitchen  ?Continue Glucerna ? ?  ? ?Acute urinary retention-resolved as of 03/30/2021 ?-Continue Urecholine ?-Foley catheter discontinued on 1/11 and patient voiding easily ? ?MSSA (methicillin susceptible Staphylococcus aureus) pneumonia (HCC)-resolved as of 04/02/2021 ?Resolved ? ?Pressure injury of skin-resolved as of 04/02/2021 ?Resolved-prior anus ulcer ? ?Pneumonia due to Serratia marcescens (HCC)-resolved as of  04/01/2021 ?Resolved ? ?Constipation ?Constipation has resolved, patient currently with diarrhea, lactulose discontinued ? ? ? ? ? ? ? ?Subjective:  ?Unresponsive to verbal or tactile stimulation ? ?Physical Exam: ?Vitals:  ? 04/05/21 2055 04/05/21 2341 04/06/21 0015 04/06/21 0401  ?BP: (!) 133/91  (!) 150/72 136/85  ?Pulse: 88 86 87 89  ?Resp: 16 16 17 19   ?Temp: 98.7 ?F (37.1 ?C)  99 ?F (37.2 ?C) 98.8 ?F (37.1 ?C)  ?TempSrc: Oral  Axillary Oral  ?SpO2: 100% 99% 98% 98%  ?Weight:      ?Height:      ? ?Constitutional: No acute distress, unresponsive  ?Respiratory: #6.0 Shiley cuffless trach, room air- lung sounds CTA bilaterally---normal respiratory pattern  ?Cardiovascular: S1-S2, NSR, normotensive -no bilateral lower extremity edema  ?Abdomen:  Abdomen soft and nontender.  PEG tube -normoactive bowels -LBM 3/3 ?Neurologic: Upper extremities are flaccid to PROM.-No further hypertonicity of lower extremities elicited with PROM.  ?Psychiatric: Opens eyes to voice- did not track my movements in the room or make eye contact ?  ? ?Data Reviewed: ?There are no new results to review at this time. ? ?Medically stable: ?Yes ? ?Family Communication:  ?Wife [name] on 2/28 ? ? DVT Prophylaxis  ., Scd's  ?Heparin injection 5,000 units  ? 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  At this time disposition remains in flux with TOC communicating on a regular basis with long-term care facilities as well as with the patient's  primary insurance company. ? ? ? ? ?COVID vaccination status:  ?Unknown ? ?Consultants: ?Neurology ?PCCM ?Medicine ?  ?Procedures: ?Echocardiogram ?EEG ?Cortrack ?PEG tube placement ?Tracheostomy tube placement ? ? ?Planned Discharge Destination:  ?Skilled nursing facility [facility]F has offered a bed- awaiting insurance auth-wife needs to FU w/ Medicaid re apparent need  to spend down assets to  allow pt to be eligible for Medicaid ? ? ? ? ?Time spent: 15 minutes ? ?Author: ?Erin Hearing, NP ?04/06/2021 7:36 AM ? ?For on call review [email or web address].  ? ?

## 2021-04-06 NOTE — Progress Notes (Addendum)
? ?NAME:  Stephen Delgado, MRN:  [id number], DOB:  1965/04/20, LOS: 68 ?ADMISSION DATE:  12/30/2020, CONSULTATION DATE:  12/19 ?REFERRING MD:  Erlinda Hong, CHIEF COMPLAINT:  Dyspnea  ? ?History of Present Illness:  ?56 yo male presented with headache, nausea and altered mental status.  CT head showed acute Lt ICH with IVH likely related to hypertensive emergency (BP 195/116). Had persistent hypertension and concern for airway protection, and PCCM asked to assist with ICU management.  Tracheostomy 12/13.  Moved out of ICU.  ? ?Pertinent  Medical History  ?HTN, DM type 2, CKD 3a, OSA ? ?Significant Hospital Events: ?Including procedures, antibiotic start and stop dates in addition to other pertinent events   ?11/29 Admit, start cleviprex ?12/01 PCCM consulted; changed from cleviprex to cardene due to elevated triglycerides ?12/03 intubated; episode of vomiting >> tube feeds held ?12/04 resume trickle tube feeds ?12/06 remains minimally responsive. Tolerating SBT.  ?12/12 no acute events overnight, T-max 102 point ?12/13 bedside trach planned midmorning  ?12/14 ATC. Lasix --> 5L UOP  ?12/15 cont on trach collar. Na to 154 from 145. Adding low rate d5  ?12/16 scheduled for PEG. Cont D5. Continues on trach collar ?Out of ICU 12/19 ?12/23 Cheyne-Stokes respirations noted and was transferred to ICU for short-term ventilation.  ?12/25 Returned to floor ?1/9 Trach changed to 6 cuffless  ?2/6 Trach follow up, no major changes, remains on ATC  ?2/27 trach f/u, on TC, seems to attend briefly, otherwise poor neuro exam ?04/06/2021 Trach FU, On ATC at 28%, no change in MS, neuro exam ? ?Imaging/Studies: ?CT head 11/29 >> large amount of IVH involving lateral/3rd/4th ventricles, ICH in Lt BG, chronic microvascular ischemic changes ?Echo 11/30 >> EF 50 to 55%, mod LVH, grade 1 DD, ascending aorta 37 mm ?EEG 12/01 >> continuous generalized slowing ?MRI brain 12/03 >> numerous small acute infarcts in b/l cerebral hemispheres, Lt caudate  hemorrhage, IVH, scattered sulcal SAH, mild communicating hydrocephalus ?CT head 12/23 Expected evolution of strokes, no shift or herniation ? ? ? ?Interim History / Subjective:  ?No sig changes  ?Appears comfortable, in NAD, currently getting bathed by nursing staff.  ?Eyes are open, but no focus or track , does not turn head to name.  ?Objective   ?Blood pressure 113/70, pulse 86, temperature 99.1 ?F (37.3 ?C), temperature source Oral, resp. rate 20, height 6' (1.829 m), weight 83.5 kg, SpO2 100 %. ?   ?FiO2 (%):  [21 %] 21 %  ? ?Intake/Output Summary (Last 24 hours) at 04/06/2021 1215 ?Last data filed at 04/06/2021 [phone] ?Gross per 24 hour  ?Intake --  ?Output 1700 ml  ?Net -1700 ml  ? ?Filed Weights  ? 03/13/21 0353 03/15/21 0509 03/16/21 0500  ?Weight: 83.9 kg 83.9 kg 83.5 kg  ? ?Physical exam  ?General 56 year old male , on ATC, in NAD ?HENT 6 cuffless trach secure and intact,  w/ some thin clear secretions  ?Pulm NWOB, on ATC 28%, bilateral chest excursion, few rhonchi ?Card rrr, S1, S2,, No RMG ?Abd soft, NT, ND, BS +, Body mass index is 24.95 kg/m?. ?Ext warm , brisk capillary refill, no edema ?Neuro:  eyes open.NO focus or track, does not turn head to voice.  ? ?Resolved Hospital Problem list   ? ? ?Assessment & Plan:  ? 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 ? ?Magdalen Spatz, NP ?[place] ?See Amion for contact info ? ? ? ? ? ? ? ?

## 2021-04-07 LAB — GLUCOSE, CAPILLARY
Glucose-Capillary: 131 mg/dL — ABNORMAL HIGH (ref 70–99)
Glucose-Capillary: 181 mg/dL — ABNORMAL HIGH (ref 70–99)
Glucose-Capillary: 163 mg/dL — ABNORMAL HIGH (ref 70–99)
Glucose-Capillary: 140 mg/dL — ABNORMAL HIGH (ref 70–99)
Glucose-Capillary: 160 mg/dL — ABNORMAL HIGH (ref 70–99)

## 2021-04-07 NOTE — TOC Progression Note (Signed)
Transition of Care (TOC) - Progression Note  ? ? ?Patient Details  ?Name: Stephen Delgado ?MRN: [id number] ?Date of Birth: 02/28/1965 ? ?Transition of Care (TOC) CM/SW Contact  ?[name], RN ?Phone Number: ?04/07/2021, 7:54 AM ? ?Clinical Narrative:    ?CM called and spoke with Dartha Lodge, RNCM with [facility] and discussed that [facility] is requesting insurance support for likely admission to the facility.  [place], RNCM states that she left a voicemail message with Izola Price, CM at the facility to communicate in the morning.  CM will follow up with the facility and family. ? ?CM and MSW with DTP Team will continue to follow for SNF admission. ? ? ?Expected Discharge Plan: Home w Home Health Services ?Barriers to Discharge: Continued Medical Work up, No SNF bed ? ?Expected Discharge Plan and Services ?Expected Discharge Plan: Home w Home Health Services ?In-house Referral: Clinical Social Work, Hospice / Palliative Care, PCP / Health Connect ?Discharge Planning Services: CM Consult ?Post Acute Care Choice: Home Health ?Living arrangements for the past 2 months: Single Family Home ?                ?DME Arranged: Hospital bed, Trach supplies, Tube feeding, Oxygen ?DME Agency: [employer] ?Date DME Agency Contacted: 03/18/21 ?Time DME Agency Contacted: 1000 ?Representative spoke with at DME Agency: Jenness Corner, CM with [employer] ?  ?  ?  ?  ?  ? ? ?Social Determinants of Health (SDOH) Interventions ?  ? ?Readmission Risk Interventions ?Readmission Risk Prevention Plan 02/20/2021  ?Transportation Screening Complete  ?Medication Review Oceanographer) Complete  ?PCP or Specialist appointment within 3-5 days of discharge Not Complete  ?PCP/Specialist Appt Not Complete comments Patient will need LTC placement at SNF facility.  ?HRI or Home Care Consult Complete  ?SW Recovery Care/Counseling Consult Complete  ?Palliative Care Screening Complete  ?Skilled Nursing Facility Complete  ?Some recent data might be hidden   ? ? ?

## 2021-04-07 NOTE — TOC Progression Note (Signed)
Transition of Care (TOC) - Progression Note  ? ? ?Patient Details  ?Name: Stephen Delgado ?MRN: [id number] ?Date of Birth: 1965/12/23 ? ?Transition of Care (TOC) CM/SW Contact  ?Curlene Labrum, RN ?Phone Number: ?04/07/2021, 10:07 AM ? ?Clinical Narrative:    ?CM met with the patient's wife, [name] and spoke with her about patient's likely placement at [facility] SNF facility.  Vara Guardian, CM at [facility] plans to speak with Christella Scheuermann today to coordinate insurance authorization and admission to the facility.   ? ?CM and MSW with DTP Team continue to follow the patient for needed SNF placement. ? ? ?Expected Discharge Plan: [place] ?Barriers to Discharge: Continued Medical Work up, No SNF bed ? ?Expected Discharge Plan and Services ?Expected Discharge Plan: [place] ?In-house Referral: Clinical Social Work, Hospice / [place], PCP / Health Connect ?Discharge Planning Services: CM Consult ?Post Acute Care Choice: Home Health ?Living arrangements for the past 2 months: [place] ?                ?DME Arranged: Hospital bed, Trach supplies, Tube feeding, Oxygen ?DME Agency: [employer] ?Date DME Agency Contacted: 03/18/21 ?Time DME Agency Contacted: 1000 ?Representative spoke with at DME Agency: Bethanne Ginger, CM with [employer] ?  ?  ?  ?  ?  ? ? ?Social Determinants of Health (SDOH) Interventions ?  ? ?Readmission Risk Interventions ?Readmission Risk Prevention Plan 02/20/2021  ?Transportation Screening Complete  ?Medication Review Press photographer) Complete  ?PCP or Specialist appointment within 3-5 days of discharge Not Complete  ?PCP/Specialist Appt Not Complete comments Patient will need LTC placement at SNF facility.  ?[place] or Home Care Consult Complete  ?SW Recovery Care/Counseling Consult Complete  ?Palliative Care Screening Complete  ?Skilled Nursing Facility Complete  ?Some recent data might be hidden  ? ? ?

## 2021-04-07 NOTE — Progress Notes (Signed)
? ?Progress Note ? ? ?Patient: Stephen Delgado YSA:[id number] DOB: [birth year] DOA: 12/30/2020     98 ?DOS: the patient was seen and examined on 04/07/2021 ? ? ? ?  ?Brief hospital course: ?56 y.o. male with PMH significant for DM2, HTN, CKD with questionable compliance to medications ?Patient presented to the ED on 12/30/2020 with complaint of headache, altered mental status ?  ?In the ED, he was hypertensive to 238/160, agitated, required restraints ?CT head showed large amount of intraventricular hemorrhage involving lateral/third/fourth ventricle, ICH and left basal ganglia as well as chronic microvascular ischemic changes. ?He was started on Cleviprex drip and admitted to neuro ICU ?  ?He subsequently had CT head repeated on 11/30, 12/1 and 12/3, all demonstrated unchanged intraparenchymal hemorrhage and intraventricular hemorrhage ?12/3, MRI brain from also showed numerous small acute infarcts in both cerebral hemisphere with minimal improvement in the right superior cerebellum ?12/3, intubated ?12/13, tracheostomy ?12/15, wean from vent to trach collar ?12/16, PEG tube placement ?12/16, repeat MRI showed an increase in the size and number of acute infarcts in the bilateral hemispheres with increased involvement in the cerebellum bilaterally.   ?12/18, transferred from neuro service to [facility]  ?12/21, palliative care consulted.  Family wanted to continue aggressive care as full CODE STATUS. ?12/23 developed Cheyne-Stokes respiratory pattern with respiratory alkalosis.  Transferred to ICU for short-term mechanical ventilation and was found to have new tracheobronchitis now on cefepime ?12/25 back to progressive floor and to [facility] ?  ?His hospital course has been complicated by significant neurological impairment, aspiration events, AKI, hypernatremia, urinary retention. ?1/9 trach changed to #6.0 Shiley cuffless--1/10 > 30 days since trach inserted. ? 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 ?A physical therapy consult is indicated based on the patient?s mobility assessment. ? ? ?Mobility Assessment (last 72 hours)   ? ? Mobility Assessment   ? ? Row Name 03/29/21 2000 03/29/21 1800 03/29/21 1400 03/29/21 1200 03/29/21 0800  ? Does patient have an order for bedrest or is patient medically unstable Yes- Bedfast (Level 1) - Complete Yes- Bedfast (Level 1) - Complete Yes- Bedfast (Level 1) - Complete Yes- Bedfast (Level 1) - Complete Yes- Bedfast (Level 1) - Complete  ? What is the highest level of mobility based on the progressive mobility assessment? Level 1 (Bedfast) - Unable to balance while sitting on edge of bed -- Level 1 (Bedfast) - Unable to balance while sitting on edge of bed Level 1 (Bedfast) - Unable to balance while sitting on edge of bed Level 1 (Bedfast) - Unable to balance while sitting on edge of bed  ? Is the above level different from baseline mobility prior to current illness? No - Consider discontinuing PT/OT -- Yes - Recommend PT order Yes - Recommend PT order Yes - Recommend PT order  ? ? Row Name 03/28/21 2000 03/28/21 0800  ?  ?  ?  ? Does patient have an order for bedrest or is patient medically unstable Yes- Bedfast (Level 1) - Complete Yes- Bedfast (Level 1) - Complete     ? What is the highest level of mobility based on the progressive mobility assessment? Level 1 (Bedfast) - Unable to balance while sitting on edge of bed Level 1 (Bedfast) - Unable to balance while  sitting on edge of bed     ? Is the above level different from baseline mobility prior to current illness? No - Consider discontinuing PT/OT No - Consider discontinuing PT/OT     ? ?  ?  ? ?  ? ? ? 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 ? ?Acute hypernatremia secondary to laxative induced diarrhea ?Recent episode 2/2 volume loss from diarrhea after aggressive bowel prep for severe obstipation ?Na 141 on 3/1 ?Had resolved but after frequency of free H2O boluses decreased Na back up to 146 w/ concurrent mild elevation in BUN ?3/3 Increase free H2O to q 2 hrs ?Continue daily miralax  ? ? ?Malignant hypertension ?Continue Norvasc Coreg, hydralazine, and clonidine ?Current BP readings in the controlled range ? ?Acute kidney injury on CKD 3b/acute azotemia with mild hypernatremia ?-Baseline renal function: BUN 18 and cr 1.49 ?-Recent cr 1.06 ?-Na stable-decrease free water to 200 cc q 4 hrs ?-Repeat BMET in am ? ?Diabetes mellitus type 2 in obese (HCC) ?-A1c 6.3 in 12/31/2020 ?-Continue SSI every 4 hours ?-CBGs well controlled ? ? ? ?  ? ?Left scleral lesion/scleral hemorrhage-resolved as of 04/01/2021 ?Resolved ? ? ? ? ?Transaminitis ?-2/2 Zyprexa ?-Resolved ? ?Pneumonia due to Klebsiella pneumoniae (HCC)-resolved as of 04/01/2021 ?Resolved ? ?Protein-calorie malnutrition/dysphagia due to recent stroke/constipation ?Nutrition Problem: Inadequate oral intake ?Etiology: lethargy/confusion, dysphagia ?Signs/Symptoms: NPO status ?Interventions: Tube feeding via PEG ?Body mass index is 23.33 kg/m?Marland Kitchen  ?Continue Glucerna ? ?  ? ?Acute urinary retention-resolved as of 03/30/2021 ?-Continue Urecholine ?-Foley catheter discontinued on 1/11 and patient voiding easily ? ?MSSA (methicillin susceptible Staphylococcus aureus) pneumonia (HCC)-resolved as of 04/02/2021 ?Resolved ? ?Pressure injury of skin-resolved as of 04/02/2021 ?Resolved-prior anus ulcer ? ?Pneumonia due to Serratia marcescens (HCC)-resolved as of 04/01/2021 ?Resolved ? ?Constipation ?Constipation has resolved,  patient currently with diarrhea, lactulose discontinued ? ? ? ? ? ? ? ?Subjective:  ? ? ?Physical Exam: ?Vitals:  ? 04/07/21 0342 04/07/21 0427 04/07/21 0824 04/07/21 0829  ?BP:  (!) 157/93 140/87   ?Pulse: 88 96 94 93  ?Resp: 16 (!) 24 20 18   ?Temp:  99 ?F (37.2 ?C) 98.8 ?F (37.1 ?C)   ?TempSrc:  Axillary Oral   ?SpO2: 98% 100% 99% 99%  ?Weight:      ?Height:      ? ?Constitutional: No acute distress, unresponsive  ?Respiratory: #6.0 Shiley cuffless trach, room air- lung sounds CTA bilaterally---normal respiratory pattern  ?Cardiovascular: S1-S2, NSR, normotensive -no bilateral lower extremity edema  ?Abdomen:  Abdomen soft and nontender.  PEG tube -normoactive bowels -LBM 3/3 ?Neurologic: Upper extremities are flaccid to PROM.-No further hypertonicity of lower extremities elicited with PROM.  ?Psychiatric: Opens eyes to voice- did not track my movements in the room or make eye contact ?  ? ?Data Reviewed: ?There are no new results to review at this time. ? ?Medically stable: ?Yes ? ?Family Communication:  ?Wife [name] on 2/28 ? ? DVT Prophylaxis  ., Scd's  ?Heparin injection 5,000 units  ? 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  At this time disposition remains in flux with TOC communicating on a regular basis with long-term care facilities as well as with the patient's primary insurance company. ? ? ? ? ?COVID vaccination status:  ?Unknown ? ?Consultants: ?Neurology ?PCCM ?  Medicine ?  ?Procedures: ?Echocardiogram ?EEG ?Cortrack ?PEG tube placement ?Tracheostomy tube placement ? ? ?Planned Discharge Destination:  ?Skilled nursing facility 3/1 [facility] SNF has offered a bed- awaiting insurance auth-wife needs to FU w/ Medicaid re apparent need to spend down assets to  allow pt to be eligible for Medicaid ? ? ? ? ?Time spent: 15 minutes ? ?Author: ?Erin Hearing, NP ?04/07/2021  9:45 AM ? ?For on call review [email or web address].  ? ?

## 2021-04-07 NOTE — Plan of Care (Signed)
  Problem: Clinical Measurements: Goal: Will remain free from infection Outcome: Progressing Goal: Diagnostic test results will improve Outcome: Progressing Goal: Respiratory complications will improve Outcome: Progressing Goal: Cardiovascular complication will be avoided Outcome: Progressing   

## 2021-04-08 DIAGNOSIS — H1033 Unspecified acute conjunctivitis, bilateral: Secondary | ICD-10-CM

## 2021-04-08 LAB — GLUCOSE, CAPILLARY
Glucose-Capillary: 141 mg/dL — ABNORMAL HIGH (ref 70–99)
Glucose-Capillary: 145 mg/dL — ABNORMAL HIGH (ref 70–99)
Glucose-Capillary: 149 mg/dL — ABNORMAL HIGH (ref 70–99)
Glucose-Capillary: 152 mg/dL — ABNORMAL HIGH (ref 70–99)
Glucose-Capillary: 161 mg/dL — ABNORMAL HIGH (ref 70–99)
Glucose-Capillary: 163 mg/dL — ABNORMAL HIGH (ref 70–99)

## 2021-04-08 MED ORDER — LACTULOSE 10 GM/15ML PO SOLN
10.0000 g | Freq: Every day | ORAL | Status: DC
  Administered 2021-04-08 – 2021-04-09 (×2): 10 g
  Filled 2021-04-08 (×2): qty 30

## 2021-04-08 MED ORDER — POLYETHYLENE GLYCOL 3350 17 G PO PACK
17.0000 g | PACK | Freq: Two times a day (BID) | ORAL | Status: DC
  Administered 2021-04-08 – 2021-04-29 (×28): 17 g
  Filled 2021-04-08 (×35): qty 1

## 2021-04-08 MED ORDER — TOBRAMYCIN 0.3 % OP SOLN
2.0000 [drp] | Freq: Four times a day (QID) | OPHTHALMIC | Status: DC
Start: 2021-04-08 — End: 2021-04-29
  Administered 2021-04-08 – 2021-04-29 (×85): 2 [drp] via OPHTHALMIC
  Filled 2021-04-08 (×2): qty 5

## 2021-04-08 NOTE — Assessment & Plan Note (Addendum)
On exam today noted with bilateral scleral injection and yellow drainage accumulating on lashes and inner canthus Continue tobramycin ophthalmic to both eyes every 6 hours [place] and purulent drainage has resolved from right eye but continues in left eye.  This is concerning for possible underlying sinus infection so I have ordered a maxillofacial CT. states that patient has had issues with recurrent sinus problems for many years

## 2021-04-08 NOTE — Progress Notes (Addendum)
Progress Note   Patient: Stephen Delgado [id number] DOB: Jun 24, 1965 DOA: 12/30/2020     99 DOS: the patient was seen and examined on 04/08/2021      Brief hospital course: 56 y.o. male with PMH significant for DM2, HTN, CKD with questionable compliance to medications Patient presented to the ED on 12/30/2020 with complaint of headache, altered mental status   In the ED, he was hypertensive to 238/160, agitated, required restraints CT head showed large amount of intraventricular hemorrhage involving lateral/third/fourth ventricle, ICH and left basal ganglia as well as chronic microvascular ischemic changes. He was started on Cleviprex drip and admitted to neuro ICU   He subsequently had CT head repeated on 11/30, 12/1 and 12/3, all demonstrated unchanged intraparenchymal hemorrhage and intraventricular hemorrhage 12/3, MRI brain from also showed numerous small acute infarcts in both cerebral hemisphere with minimal improvement in the right superior cerebellum 12/3, intubated 12/13, tracheostomy 12/15, wean from vent to trach collar 12/16, PEG tube placement 12/16, repeat MRI showed an increase in the size and number of acute infarcts in the bilateral hemispheres with increased involvement in the cerebellum bilaterally.   12/18, transferred from neuro service to [facility]  12/21, palliative care consulted.  Family wanted to continue aggressive care as full CODE STATUS. 12/23 developed Cheyne-Stokes respiratory pattern with respiratory alkalosis.  Transferred to ICU for short-term mechanical ventilation and was found to have new tracheobronchitis now on cefepime 12/25 back to progressive floor and to [facility]   His hospital course has been complicated by significant neurological impairment, aspiration events, AKI, hypernatremia, urinary retention. 1/9 trach changed to #6.0 Shiley cuffless--1/10 > 30 days since trach inserted.  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   Mobility Assessment (last 72 hours)     Mobility Assessment     Row Name 03/29/21 2000 03/29/21 1800 03/29/21 1400 03/29/21 1200 03/29/21 0800   Does patient have an order for bedrest or is patient medically unstable Yes- Bedfast (Level 1) - Complete Yes- Bedfast (Level 1) - Complete Yes- Bedfast (Level 1) - Complete Yes- Bedfast (Level 1) - Complete Yes- Bedfast (Level 1) - Complete   What is the highest level of mobility based on the progressive mobility assessment? Level 1 (Bedfast) - Unable to balance while sitting on edge of bed -- Level 1 (Bedfast) - Unable to balance while sitting on edge of bed Level 1 (Bedfast) - Unable to balance while sitting on edge of bed Level 1 (Bedfast) - Unable to balance while sitting on edge of bed   Is the above level different from baseline mobility prior to current illness? No - Consider discontinuing PT/OT -- Yes - Recommend PT order Yes - Recommend PT order Yes - Recommend PT order    [place] Name 03/28/21 2000 03/28/21 0800         Does patient have an order for bedrest or is patient medically unstable Yes- Bedfast (Level 1) - Complete Yes- Bedfast (Level 1) - Complete      What is the highest level of mobility based on the progressive mobility assessment? Level 1 (Bedfast) - Unable to balance while sitting on edge of bed Level 1 (Bedfast) - Unable to balance while  sitting on edge of bed      Is the above level different from baseline mobility prior to current illness? 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   Malignant hypertension Continue Norvasc Coreg, hydralazine, and clonidine Current BP readings in the controlled range  Acute kidney injury on CKD 3b/acute azotemia with mild hypernatremia -Baseline renal function: BUN 18 and cr 1.49 -Recent cr 1.06 -Na stable-decrease free water to 200 cc q 4 hrs -Repeat BMET in am  Diabetes mellitus type 2 in obese (HCC) -A1c 6.3 in 12/31/2020 -Continue SSI every 4 hours -CBGs well controlled       Acute conjunctivitis, bilateral On exam today noted with bilateral scleral injection and yellow drainage accumulating on lashes and inner canthus Begin tobramycin ophthalmic to both eyes every 6 hours  Transaminitis -2/2 Zyprexa -Resolved  Pneumonia due to Klebsiella pneumoniae (HCC)-resolved as of 04/01/2021 Resolved  Protein-calorie malnutrition/dysphagia due to recent stroke/constipation Nutrition Problem: Inadequate oral intake Etiology: lethargy/confusion, dysphagia Signs/Symptoms: NPO status Interventions: Tube feeding via PEG Body mass index is 23.33 kg/m.  Continue Glucerna     Acute urinary retention-resolved as of 03/30/2021 -Continue Urecholine -Foley catheter discontinued on 1/11 and patient voiding easily  MSSA (methicillin susceptible Staphylococcus aureus) pneumonia (HCC)-resolved as of  04/02/2021 Resolved  Pressure injury of skin-resolved as of 04/02/2021 Resolved-prior anus ulcer  Pneumonia due to Serratia marcescens (HCC)-resolved as of 04/01/2021 Resolved  Constipation Constipation has resolved, patient currently with diarrhea, lactulose discontinued        Subjective:  Unresponsive  Physical Exam: Vitals:   04/08/21 0437 04/08/21 0812 04/08/21 0831 04/08/21 1048  BP: (!) 146/78 (!) 142/79    Pulse: 89 90 91 78  Resp: 14 18 18 19   Temp: 98.4 F (36.9 C) 99.1 F (37.3 C)    TempSrc: Oral Axillary    SpO2: 98% 99% 99% 98%  Weight:      Height:       Constitutional: No acute distress, unresponsive Eyes: Bilateral injection with thick yellow drainage accumulating on the lashes in the inner canthus Respiratory: #6.0 Shiley cuffless trach, room air- lung sounds CTA bilaterally---normal respiratory pattern  Cardiovascular: S1-S2, NSR, normotensive -no bilateral lower extremity edema  Abdomen:  Abdomen soft and nontender.  PEG tube -normoactive bowels -LBM 3/6 Neurologic: Upper extremities are flaccid to PROM.-No further hypertonicity of lower extremities elicited with PROM.  Psychiatric: Opens eyes to voice- did not track my movements in the room or make eye contact    Data Reviewed: There are no new results to review at this time.  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  At this time disposition remains in flux with TOC communicating on a regular basis with long-term care facilities as well as with the patient's primary insurance company.     COVID vaccination status:  Unknown  Consultants: Neurology PCCM Medicine   Procedures: Echocardiogram EEG Cortrack PEG tube  placement Tracheostomy tube placement   Planned Discharge Destination:  Skilled nursing facility 3/1 [facility] SNF has offered a bed- awaiting insurance auth-wife needs to FU w/ Medicaid re apparent need to spend down assets to  allow pt to be eligible for Medicaid     Time spent: 15 minutes  Author: Erin Hearing, NP 04/08/2021 11:47 AM  For on call review [email or web address].   Addendum Patient seen and examined today. Agree with Ms. Lissa Merlin documentation and plan of management.  Unfortunately, patient remains in the persistent vegetative state and trach dependent.  TOC assisting with placement.   Estill Cotta M.D.  [facility] 04/08/2021, 3:10 PM

## 2021-04-08 NOTE — Plan of Care (Signed)
?  Problem: Clinical Measurements: ?Goal: Ability to maintain clinical measurements within normal limits will improve ?Outcome: Progressing ?Goal: Will remain free from infection ?Outcome: Progressing ?Goal: Diagnostic test results will improve ?Outcome: Progressing ?Goal: Respiratory complications will improve ?Outcome: Progressing ?Goal: Cardiovascular complication will be avoided ?Outcome: Progressing ?  ?Problem: Nutrition: ?Goal: Adequate nutrition will be maintained ?Outcome: Progressing ?  ?Problem: Coping: ?Goal: Level of anxiety will decrease ?Outcome: Progressing ?  ?Problem: Elimination: ?Goal: Will not experience complications related to bowel motility ?Outcome: Progressing ?  ?Problem: Safety: ?Goal: Ability to remain free from injury will improve ?Outcome: Progressing ?  ?Problem: Skin Integrity: ?Goal: Risk for impaired skin integrity will decrease ?Outcome: Progressing ?  ?Problem: Nutrition: ?Goal: Risk of aspiration will decrease ?Outcome: Progressing ?Goal: Dietary intake will improve ?Outcome: Progressing ?  ?

## 2021-04-08 NOTE — Progress Notes (Signed)
Patient seen today by trach team for consult.  No education needed at this time.  All the necessary equipment is at the bedside.  Will continue to follow for progression.   ?

## 2021-04-08 NOTE — TOC Progression Note (Addendum)
Transition of Care (TOC) - Progression Note  ? ? ?Patient Details  ?Name: Stephen Delgado ?MRN: [id number] ?Date of Birth: 06/03/1965 ? ?Transition of Care (TOC) CM/SW Contact  ?[name], RN ?Phone Number: ?04/08/2021, 11:16 AM ? ?Clinical Narrative:    ?CM left a message with Essie Hart, CM at [facility].  [name], [name], is aware that the facility is in communication with patient's insurance provider regarding placement.  Will follow up and provide clinicals and support as necessary for SNF placement. ? ?04/08/2021 1130 - CM spoke with Izola Price, CM at [facility] facility and she is waiting for follow up from DSS regarding patient's financials. ? ?CM and MSW with DTP Team continue to follow the patient for SNF placement. ? ? ?Expected Discharge Plan: Home w Home Health Services ?Barriers to Discharge: Continued Medical Work up, No SNF bed ? ?Expected Discharge Plan and Services ?Expected Discharge Plan: Home w Home Health Services ?In-house Referral: Clinical Social Work, Hospice / Palliative Care, PCP / Health Connect ?Discharge Planning Services: CM Consult ?Post Acute Care Choice: Home Health ?Living arrangements for the past 2 months: Single Family Home ?                ?DME Arranged: Hospital bed, Trach supplies, Tube feeding, Oxygen ?DME Agency: [employer] ?Date DME Agency Contacted: 03/18/21 ?Time DME Agency Contacted: 1000 ?Representative spoke with at DME Agency: Jenness Corner, CM with [employer] ?  ?  ?  ?  ?  ? ? ?Social Determinants of Health (SDOH) Interventions ?  ? ?Readmission Risk Interventions ?Readmission Risk Prevention Plan 02/20/2021  ?Transportation Screening Complete  ?Medication Review Oceanographer) Complete  ?PCP or Specialist appointment within 3-5 days of discharge Not Complete  ?PCP/Specialist Appt Not Complete comments Patient will need LTC placement at SNF facility.  ?[place] or Home Care Consult Complete  ?SW Recovery Care/Counseling Consult Complete   ?Palliative Care Screening Complete  ?Skilled Nursing Facility Complete  ?Some recent data might be hidden  ? ? ?

## 2021-04-09 ENCOUNTER — Inpatient Hospital Stay (HOSPITAL_COMMUNITY): Payer: 59

## 2021-04-09 DIAGNOSIS — B309 Viral conjunctivitis, unspecified: Secondary | ICD-10-CM

## 2021-04-09 LAB — GLUCOSE, CAPILLARY
Glucose-Capillary: 121 mg/dL — ABNORMAL HIGH (ref 70–99)
Glucose-Capillary: 133 mg/dL — ABNORMAL HIGH (ref 70–99)
Glucose-Capillary: 142 mg/dL — ABNORMAL HIGH (ref 70–99)
Glucose-Capillary: 151 mg/dL — ABNORMAL HIGH (ref 70–99)
Glucose-Capillary: 162 mg/dL — ABNORMAL HIGH (ref 70–99)
Glucose-Capillary: 92 mg/dL (ref 70–99)

## 2021-04-09 LAB — COMPREHENSIVE METABOLIC PANEL
ALT: 115 U/L — ABNORMAL HIGH (ref 0–44)
AST: 49 U/L — ABNORMAL HIGH (ref 15–41)
Albumin: 2.5 g/dL — ABNORMAL LOW (ref 3.5–5.0)
Alkaline Phosphatase: 68 U/L (ref 38–126)
Anion gap: 10 (ref 5–15)
BUN: 50 mg/dL — ABNORMAL HIGH (ref 6–20)
CO2: 26 mmol/L (ref 22–32)
Calcium: 9.4 mg/dL (ref 8.9–10.3)
Chloride: 103 mmol/L (ref 98–111)
Creatinine, Ser: 0.96 mg/dL (ref 0.61–1.24)
GFR, Estimated: >60 mL/min (ref >60)
Glucose, Bld: 152 mg/dL — ABNORMAL HIGH (ref 70–99)
Potassium: 4 mmol/L (ref 3.5–5.1)
Sodium: 139 mmol/L (ref 135–145)
Total Bilirubin: 0.5 mg/dL (ref 0.3–1.2)
Total Protein: 6.3 g/dL — ABNORMAL LOW (ref 6.5–8.1)

## 2021-04-09 NOTE — TOC Progression Note (Signed)
Transition of Care (TOC) - Progression Note  ? ? ?Patient Details  ?Name: Stephen Delgado ?MRN: [id number] ?Date of Birth: 03/31/1965 ? ?Transition of Care (TOC) CM/SW Contact  ?[name], RN ?Phone Number: ?04/09/2021, 9:22 AM ? ?Clinical Narrative:    ?CM spoke with Essie Hart, CM at [facility] and she is continuing to work with DSS and Rosann Auerbach for admission documents and insurance coverage for placement at the facility. ? ?CM and MSW with DTP Team will continue to follow the patient for SNF placement. ? ? ?Expected Discharge Plan: Home w Home Health Services ?Barriers to Discharge: Continued Medical Work up, No SNF bed ? ?Expected Discharge Plan and Services ?Expected Discharge Plan: Home w Home Health Services ?In-house Referral: Clinical Social Work, Hospice / Palliative Care, PCP / Health Connect ?Discharge Planning Services: CM Consult ?Post Acute Care Choice: Home Health ?Living arrangements for the past 2 months: Single Family Home ?                ?DME Arranged: Hospital bed, Trach supplies, Tube feeding, Oxygen ?DME Agency: [employer] ?Date DME Agency Contacted: 03/18/21 ?Time DME Agency Contacted: 1000 ?Representative spoke with at DME Agency: Jenness Corner, CM with [employer] ?  ?  ?  ?  ?  ? ? ?Social Determinants of Health (SDOH) Interventions ?  ? ?Readmission Risk Interventions ?Readmission Risk Prevention Plan 02/20/2021  ?Transportation Screening Complete  ?Medication Review Oceanographer) Complete  ?PCP or Specialist appointment within 3-5 days of discharge Not Complete  ?PCP/Specialist Appt Not Complete comments Patient will need LTC placement at SNF facility.  ?HRI or Home Care Consult Complete  ?SW Recovery Care/Counseling Consult Complete  ?Palliative Care Screening Complete  ?Skilled Nursing Facility Complete  ?Some recent data might be hidden  ? ? ?

## 2021-04-09 NOTE — Progress Notes (Addendum)
HOSPITAL MEDICINE OVERNIGHT EVENT NOTE   ? ?Notified by radiology that patient has a small bowel obstruction on abdominal x-ray. ? ?Case discussed with bedside nurse.  Apparently patient has been exhibiting several bouts of vomiting earlier today.  Tube feeds were then held and abdominal x-ray was obtained. ? ?Nursing reports the patient is not able to explain their symptoms due to known history of brain injury but does not appear to be in distress due to abdominal pain.  Abdomen is slightly distended but soft and patient has not exhibited any further vomiting for the past several hours. ? ?We will notify general surgery as to the presence of small bowel obstruction although I do not think the patient needs urgent surgical intervention this evening we will keep patient n.p.o and continue to monitor closely. ? ?Marinda Elk  MD ?[facility]  ? ?ADDENDUM 3/10 7:10AM ? ?Discussed with surgery who is requesting CT imaging of the abdomen with oral contrast which can be administered via PEG.  Order placed. ? ?Deno Lunger Melony Tenpas ? ? ? ? ? ? ? ? ? ? ? ? ?

## 2021-04-09 NOTE — Progress Notes (Signed)
[facility]                                                                              Patient Demographics  Stephen Delgado, is a 56 y.o. male, DOB - 1965-10-21, [id number]  Admit date - 12/30/2020   Admitting Physician Kerney Elbe, MD  Outpatient Primary MD for the patient is Patient, [name]  Outpatient specialists:   LOS - 100  days   Medical records reviewed and are as summarized below:    Chief Complaint  Patient presents with   Altered Mental Status       Brief summary   56 y.o. male with PMH significant for DM2, HTN, CKD with questionable compliance to medications Patient presented to the ED on 12/30/2020 with complaint of headache, altered mental status   In the ED, he was hypertensive to 238/160, agitated, required restraints CT head showed large amount of intraventricular hemorrhage involving lateral/third/fourth ventricle, ICH and left basal ganglia as well as chronic microvascular ischemic changes. He was started on Cleviprex drip and admitted to neuro ICU   He subsequently had CT head repeated on 11/30, 12/1 and 12/3, all demonstrated unchanged intraparenchymal hemorrhage and intraventricular hemorrhage 12/3, MRI brain from also showed numerous small acute infarcts in both cerebral hemisphere with minimal improvement in the right superior cerebellum 12/3, intubated 12/13, tracheostomy 12/15, wean from vent to trach collar 12/16, PEG tube placement 12/16, repeat MRI showed an increase in the size and number of acute infarcts in the bilateral hemispheres with increased involvement in the cerebellum bilaterally.   12/18, transferred from neuro service to [facility]  12/21, palliative care consulted.  Family wanted to continue aggressive care as full CODE STATUS. 12/23 developed Cheyne-Stokes respiratory pattern with respiratory alkalosis.  Transferred to ICU for short-term mechanical ventilation and was found to have new  tracheobronchitis now on cefepime 12/25 back to progressive floor and to [facility]   His hospital course has been complicated by significant neurological impairment, aspiration events, AKI, hypernatremia, urinary retention. 1/9 trach changed to #6.0 Shiley cuffless--1/10 > 30 days since trach inserted.     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 (recent MSSA and serratia pneumonia; recent Klebsiella tracheobronchitis); -Continue trach care, scopolamine patch     Acute hypernatremia secondary to laxative induced diarrhea - Recent episode due to volume loss from diarrhea after aggressive bowel prep for severe obstipation -Now resolved, sodium 139, continue free water     Malignant hypertension -BP stable, continue Norvasc, Coreg, hydralazine and clonidine    Acute kidney injury on CKD  -Baseline creatinine ~1.4 -Creatinine currently 0.9, improved, continue free water, 200 cc every 4 hours    Diabetes mellitus type 2 in obese (HCC) -A1c 6.3 in 12/31/2020 -Continue SSI every 4 hours -CBGs well controlled      Acute conjunctivitis, bilateral -Continue tobramycin ophthalmic eyedrops    Transaminitis -Likely due to Zyprexa, resolved  Protein-calorie  malnutrition/dysphagia due to recent stroke/constipation -Continue tube feeds via PEG tube  Constipation -Resolved, had diarrhea, lactulose discontinued   Pneumonia due to Klebsiella pneumoniae (HCC)-resolved as of 04/01/2021 Resolved     Acute urinary retention-resolved as of 03/30/2021 -Continue Urecholine -Foley catheter discontinued on 1/11 and patient voiding easily   MSSA (methicillin susceptible Staphylococcus  aureus) pneumonia (HCC)-resolved as of 04/02/2021 Resolved   Pressure injury of skin-resolved as of 04/02/2021 Resolved-prior anus ulcer   Pneumonia due to Serratia marcescens (HCC)-resolved as of 04/01/2021 Resolved    Code Status: full  DVT Prophylaxis:  enoxaparin (LOVENOX) injection 40 mg Start: 03/21/21 1030   Level of Care: Level of care: Med-Surg Family Communication:  Disposition Plan:     Status is: Inpatient Remains inpatient appropriate because: TOC assisting with placement  Procedures:  Echocardiogram EEG Cortrack PEG tube placement Tracheostomy tube placement  Consultants:   Neurology CCM   Antimicrobials:   Anti-infectives (From admission, onward)    Start     Dose/Rate Route Frequency Ordered Stop   02/03/21 2200  ceFEPIme (MAXIPIME) 2 g in sodium chloride 0.9 % 100 mL IVPB        2 g 200 mL/hr over 30 Minutes Intravenous Every 8 hours 02/03/21 1153 02/05/21 2142   01/30/21 1000  ceFEPIme (MAXIPIME) 2 g in sodium chloride 0.9 % 100 mL IVPB  Status:  Discontinued        2 g 200 mL/hr over 30 Minutes Intravenous Every 12 hours 01/30/21 0832 02/03/21 1153   01/27/21 1900  sulfamethoxazole-trimethoprim (BACTRIM DS) 800-160 MG per tablet 1 tablet  Status:  Discontinued        1 tablet Per Tube Every 12 hours 01/27/21 1403 01/30/21 0808   01/24/21 1100  ceFEPIme (MAXIPIME) 2 g in sodium chloride 0.9 % 100 mL IVPB  Status:  Discontinued        2 g 200 mL/hr over 30 Minutes Intravenous Every 8 hours 01/24/21 1025 01/27/21 1403   01/15/21 1630  piperacillin-tazobactam (ZOSYN) IVPB 3.375 g        3.375 g 12.5 mL/hr over 240 Minutes Intravenous Every 8 hours 01/15/21 0943 01/22/21 0400   01/15/21 1030  piperacillin-tazobactam (ZOSYN) IVPB 3.375 g        3.375 g 100 mL/hr over 30 Minutes Intravenous  Once 01/15/21 0943 01/15/21 1035   01/05/21 1600  ceFAZolin (ANCEF) IVPB 2g/100 mL premix        2 g 200 mL/hr over 30 Minutes Intravenous Every 8 hours 01/05/21 0853  01/10/21 0848   01/03/21 0830  piperacillin-tazobactam (ZOSYN) IVPB 3.375 g  Status:  Discontinued        3.375 g 12.5 mL/hr over 240 Minutes Intravenous Every 8 hours 01/03/21 0800 01/05/21 0853          Medications  Scheduled Meds:  amantadine  100 mg Per Tube BID   amLODipine  10 mg Per Tube QHS   baclofen  10 mg Per Tube TID AC & HS   carvedilol  25 mg Per Tube BID WC   chlorhexidine  15 mL Mouth Rinse BID   enoxaparin (LOVENOX) injection  40 mg Subcutaneous Daily   free water  200 mL Per Tube Q2H   hydrALAZINE  100 mg Per Tube Q8H   insulin aspart  0-20 Units Subcutaneous Q4H   lactulose  10 g Per Tube Daily   mouth rinse  15 mL Mouth Rinse q12n4p   nutrition supplement (JUVEN)  1 packet Per Tube BID BM  OLANZapine  2.5 mg Per Tube QHS   pantoprazole sodium  40 mg Per Tube Daily   polyethylene glycol  17 g Per Tube BID   polyvinyl alcohol  2 drop Left Eye Q6H   scopolamine  1 patch Transdermal Q72H   tobramycin  2 drop Both Eyes Q6H   Continuous Infusions:  feeding supplement (GLUCERNA 1.5 CAL) 1,000 mL (04/09/21 1509)   PRN Meds:.[DISCONTINUED] acetaminophen **OR** acetaminophen (TYLENOL) oral liquid 160 mg/5 mL **OR** acetaminophen, guaiFENesin, hydrALAZINE, influenza vac split quadrivalent PF, ondansetron (ZOFRAN) IV      Subjective:   Avedis Callo was seen and examined today.   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  Briefly opens eyes, no tracking.   Objective:   Vitals:   04/09/21 0816 04/09/21 1105 04/09/21 1157 04/09/21 1515  BP: (!) 152/85  124/73   Pulse:  87 80 87  Resp:  18 15 18   Temp:   98.7 F (37.1 C)   TempSrc:   Oral   SpO2:  97% 98% 97%  Weight:      Height:        Intake/Output Summary (Last 24 hours) at 04/09/2021 1602 Last data filed at 04/09/2021 1115 Gross per 24 hour  Intake --  Output 1775 ml  Net -1775 ml     Wt Readings from Last 3 Encounters:  03/16/21 83.5 kg  02/06/20 89.1 kg   08/18/17 81.3 kg     Exam General: Mostly unresponsive, NAD, trach Cardiovascular: S1 S2 auscultated, RRR Respiratory: Clear to auscultation bilaterally, no wheezing, rales Gastrointestinal: Soft, nontender, PEG tube, NBS Ext: no pedal edema bilaterally Neuro: does not follow commands Psych: opens eyes however does not track movements or make contact   Data Reviewed:  I have personally reviewed following labs and imaging studies    Radiology Reports No recent imaging data  Lab Data:  CBC: Recent Labs  Lab 04/04/21 0055  WBC 11.1*  NEUTROABS 7.8*  HGB 9.4*  HCT 31.3*  MCV 79.2*  PLT [id number]   Basic Metabolic Panel: Recent Labs  Lab 04/03/21 0527 04/04/21 0055 04/09/21 0049  NA 146* 144 139  K 3.6 3.7 4.0  CL 107 109 103  CO2 28 27 26   GLUCOSE 168* 154* 152*  BUN 46* 51* 50*  CREATININE 0.88 0.91 0.96  CALCIUM 9.6 9.6 9.4   GFR: Estimated Creatinine Clearance: 93.2 mL/min (by C-G formula based on SCr of 0.96 mg/dL). Liver Function Tests: Recent Labs  Lab 04/04/21 0055 04/09/21 0049  AST 24 49*  ALT 54* 115*  ALKPHOS 59 68  BILITOT 0.3 0.5  PROT 6.2* 6.3*  ALBUMIN 2.4* 2.5*    CBG: Recent Labs  Lab 04/08/21 2015 04/09/21 0018 04/09/21 0426 04/09/21 0754 04/09/21 1159  GLUCAP 145* 151* 121* 133* 142*     Treesa Mccully M.D. [facility] 04/09/2021, 4:02 PM  Available via Epic secure chat 7am-7pm After 7 pm, please refer to night coverage provider listed on amion.

## 2021-04-09 NOTE — Progress Notes (Signed)
[id number] Hospital Liaison note: ? ?New request for [facility] Palliative Care services. Will continue to follow for disposition. ? ?Please call with any outpatient palliative questions or concerns. ? ?Thank you for the opportunity to participate in this patient's care. ?  ?Thank you, ?Dee Curry, LPN ?[facility] Hospital Liaison ?[phone] ?

## 2021-04-09 NOTE — Plan of Care (Signed)
  Problem: Clinical Measurements: Goal: Ability to maintain clinical measurements within normal limits will improve Outcome: Progressing Goal: Will remain free from infection Outcome: Progressing   Problem: Activity: Goal: Risk for activity intolerance will decrease Outcome: Progressing   Problem: Elimination: Goal: Will not experience complications related to bowel motility Outcome: Progressing   

## 2021-04-10 ENCOUNTER — Inpatient Hospital Stay (HOSPITAL_COMMUNITY): Payer: 59

## 2021-04-10 DIAGNOSIS — K56609 Unspecified intestinal obstruction, unspecified as to partial versus complete obstruction: Secondary | ICD-10-CM

## 2021-04-10 LAB — COMPREHENSIVE METABOLIC PANEL
ALT: 103 U/L — ABNORMAL HIGH (ref 0–44)
AST: 35 U/L (ref 15–41)
Albumin: 2.6 g/dL — ABNORMAL LOW (ref 3.5–5.0)
Alkaline Phosphatase: 58 U/L (ref 38–126)
Anion gap: 11 (ref 5–15)
BUN: 41 mg/dL — ABNORMAL HIGH (ref 6–20)
CO2: 25 mmol/L (ref 22–32)
Calcium: 9.7 mg/dL (ref 8.9–10.3)
Chloride: 107 mmol/L (ref 98–111)
Creatinine, Ser: 0.93 mg/dL (ref 0.61–1.24)
GFR, Estimated: >60 mL/min (ref >60)
Glucose, Bld: 111 mg/dL — ABNORMAL HIGH (ref 70–99)
Potassium: 3.6 mmol/L (ref 3.5–5.1)
Sodium: 143 mmol/L (ref 135–145)
Total Bilirubin: 0.6 mg/dL (ref 0.3–1.2)
Total Protein: 6.7 g/dL (ref 6.5–8.1)

## 2021-04-10 LAB — LACTIC ACID, PLASMA: Lactic Acid, Venous: 0.7 mmol/L (ref 0.5–1.9)

## 2021-04-10 LAB — CBC WITH DIFFERENTIAL/PLATELET
Abs Immature Granulocytes: 0.07 10*3/uL (ref 0.00–0.07)
Basophils Absolute: 0 10*3/uL (ref 0.0–0.1)
Basophils Relative: 0 %
Eosinophils Absolute: 0.2 10*3/uL (ref 0.0–0.5)
Eosinophils Relative: 2 %
HCT: 32 % — ABNORMAL LOW (ref 39.0–52.0)
Hemoglobin: 9.9 g/dL — ABNORMAL LOW (ref 13.0–17.0)
Immature Granulocytes: 1 %
Lymphocytes Relative: 18 %
Lymphs Abs: 1.6 10*3/uL (ref 0.7–4.0)
MCH: 24.1 pg — ABNORMAL LOW (ref 26.0–34.0)
MCHC: 30.9 g/dL (ref 30.0–36.0)
MCV: 77.9 fL — ABNORMAL LOW (ref 80.0–100.0)
Monocytes Absolute: 0.9 10*3/uL (ref 0.1–1.0)
Monocytes Relative: 9 %
Neutro Abs: 6.5 10*3/uL (ref 1.7–7.7)
Neutrophils Relative %: 70 %
Platelets: 243 10*3/uL (ref 150–400)
RBC: 4.11 MIL/uL — ABNORMAL LOW (ref 4.22–5.81)
RDW: 16.8 % — ABNORMAL HIGH (ref 11.5–15.5)
WBC: 9.2 10*3/uL (ref 4.0–10.5)
nRBC: 0 % (ref 0.0–0.2)

## 2021-04-10 LAB — GLUCOSE, CAPILLARY
Glucose-Capillary: 105 mg/dL — ABNORMAL HIGH (ref 70–99)
Glucose-Capillary: 109 mg/dL — ABNORMAL HIGH (ref 70–99)
Glucose-Capillary: 113 mg/dL — ABNORMAL HIGH (ref 70–99)
Glucose-Capillary: 124 mg/dL — ABNORMAL HIGH (ref 70–99)
Glucose-Capillary: 128 mg/dL — ABNORMAL HIGH (ref 70–99)
Glucose-Capillary: 96 mg/dL (ref 70–99)

## 2021-04-10 MED ORDER — DEXTROSE-NACL 5-0.9 % IV SOLN: INTRAVENOUS | Status: DC

## 2021-04-10 MED ORDER — METOCLOPRAMIDE HCL 5 MG/ML IJ SOLN
5.0000 mg | Freq: Four times a day (QID) | INTRAMUSCULAR | Status: DC
  Administered 2021-04-10: 5 mg via INTRAVENOUS
  Filled 2021-04-10: qty 2

## 2021-04-10 MED ORDER — METOCLOPRAMIDE HCL 5 MG/ML IJ SOLN
5.0000 mg | Freq: Four times a day (QID) | INTRAMUSCULAR | Status: DC
  Administered 2021-04-10 – 2021-04-29 (×75): 5 mg via INTRAVENOUS
  Filled 2021-04-10 (×75): qty 2

## 2021-04-10 MED ORDER — IOHEXOL 300 MG/ML  SOLN
100.0000 mL | Freq: Once | INTRAMUSCULAR | Status: AC | PRN
  Administered 2021-04-10: 100 mL via INTRAVENOUS

## 2021-04-10 MED ORDER — LACTULOSE 10 GM/15ML PO SOLN
20.0000 g | Freq: Three times a day (TID) | ORAL | Status: DC
  Administered 2021-04-10 – 2021-04-12 (×7): 20 g via ORAL
  Filled 2021-04-10 (×8): qty 30

## 2021-04-10 NOTE — Progress Notes (Signed)
? ? ?84 Days Post-Op  ?Subjective: ?CC: ?Briefly this is a 56 year old male who has been here for more than 100 days after brain injury secondary to intraparenchymal and intraventricular hemorrhage with bilateral multifocal ischemic infarcts. His hospital course has been complicated by significant neurological impairment, aspiration events, AKI, hypernatremia, urinary retention. Since admission he has been trached and is now on trach collar.  He had a PEG placed by our team on 12/16.  He has been tube feed dependent and appeared to be tolerating this well until 3/9 when he had multiple episodes of emesis.  An x-ray was obtained that showed dilated loops of small bowel concerning for possible SBO.  We are asked to see.  CT A/P pending.  His PEG tube is currently clamped and TF's are off.  Patient unable to provide further history. His last BM is documented as yesterday. To note he has had difficulties with constipation during admission, requiring a bowel prep during the week of 2/24 with multiple episodes of diarrhea following. He is currently on daily lactulose and BID miralax.  Other than PEG placement, patient has no other prior abdominal surgeries listed in chart ? ? ?Objective: ?Vital signs in last 24 hours: ?Temp:  [98.3 ?F (36.8 ?C)-99.3 ?F (37.4 ?C)] 98.3 ?F (36.8 ?C) (03/10 1207) ?Pulse Rate:  [86-97] 94 (03/10 1207) ?Resp:  [14-26] 16 (03/10 1207) ?BP: (123-146)/(76-97) 145/81 (03/10 1207) ?SpO2:  [95 %-100 %] 100 % (03/10 1207) ?FiO2 (%):  [21 %] 21 % (03/10 1127) ?Last BM Date : 04/09/21 ? ?Intake/Output from previous day: ?03/09 0701 - 03/10 0700 ?In: -  ?Out: 1975 [id number] ?Intake/Output this shift: ?No intake/output data recorded. ? ?PE: ?Gen: Opens eyes to voice but does not follow commands or provide history ?Pulm:  On trach collar. Rate and effort normal ?Abd: Soft, mild distention, no rigidity or guarding. Unable to assess if any tenderness but patient does not grimace with palpation.  +BS.  PEG tube in place and currently clamped. Ext:  No LE edema or calf tenderness ? ?Lab Results:  ?Recent Labs  ?  04/10/21 ?1045  ?WBC 9.2  ?HGB 9.9*  ?HCT 32.0*  ?PLT 243  ? ?BMET ?Recent Labs  ?  04/09/21 ?[id number]  ?NA 139  ?K 4.0  ?CL 103  ?CO2 26  ?GLUCOSE 152*  ?BUN 50*  ?CREATININE 0.96  ?CALCIUM 9.4  ? ?PT/INR ?No results for input(s): LABPROT, INR in the last 72 hours. ?CMP  ?   ?Component Value Date/Time  ? NA 139 04/09/2021 0049  ? NA 145 (H) 05/31/2018 1352  ? K 4.0 04/09/2021 0049  ? CL 103 04/09/2021 0049  ? CO2 26 04/09/2021 0049  ? GLUCOSE 152 (H) 04/09/2021 0049  ? BUN 50 (H) 04/09/2021 0049  ? BUN 22 05/31/2018 1352  ? CREATININE 0.96 04/09/2021 0049  ? CALCIUM 9.4 04/09/2021 0049  ? PROT 6.3 (L) 04/09/2021 0049  ? ALBUMIN 2.5 (L) 04/09/2021 0049  ? AST 49 (H) 04/09/2021 0049  ? ALT 115 (H) 04/09/2021 0049  ? ALKPHOS 68 04/09/2021 0049  ? BILITOT 0.5 04/09/2021 0049  ? GFRNONAA >60 04/09/2021 0049  ? GFRAA 70 05/31/2018 1352  ? ?Lipase  ?No results found for: LIPASE ? ?Studies/Results: ?DG Chest Port 1 View ? ?Result Date: 04/09/2021 ?CLINICAL DATA:  Emesis distension EXAM: PORTABLE CHEST 1 VIEW COMPARISON:  03/25/2021, 02/19/2021 FINDINGS: Tracheostomy tube remains in place. Mild cardiomegaly. No focal opacity or pleural effusion. No visible pneumothorax. IMPRESSION: 1. Borderline to mild  cardiomegaly. No acute interval change since 03/25/2021 Electronically Signed   By: Donavan Foil M.D.   On: 04/09/2021 19:15  ? ?DG Abd Portable 1V ? ?Result Date: 04/09/2021 ?CLINICAL DATA:  Emesis and abdominal distension. EXAM: PORTABLE ABDOMEN - 1 VIEW COMPARISON:  Abdominal x-ray 03/26/2021. FINDINGS: Peg tube tip is overlying the mid abdomen, unchanged. There is diffuse gaseous distention of colon. Transverse colon is borderline dilated. There are dilated loops of small bowel in the right abdomen measuring 3.5 cm. No acute fractures are seen. IMPRESSION: 1. Dilated small bowel concerning for small bowel obstruction.  Electronically Signed   By: Ronney Asters M.D.   On: 04/09/2021 19:19   ? ?Anti-infectives: ?Anti-infectives (From admission, onward)  ? ? Start     Dose/Rate Route Frequency Ordered Stop  ? 02/03/21 2200  ceFEPIme (MAXIPIME) 2 g in sodium chloride 0.9 % 100 mL IVPB       ? 2 g ?200 mL/hr over 30 Minutes Intravenous Every 8 hours 02/03/21 1153 02/05/21 2142  ? 01/30/21 1000  ceFEPIme (MAXIPIME) 2 g in sodium chloride 0.9 % 100 mL IVPB  Status:  Discontinued       ? 2 g ?200 mL/hr over 30 Minutes Intravenous Every 12 hours 01/30/21 0832 02/03/21 1153  ? 01/27/21 1900  sulfamethoxazole-trimethoprim (BACTRIM DS) 800-160 MG per tablet 1 tablet  Status:  Discontinued       ? 1 tablet Per Tube Every 12 hours 01/27/21 1403 01/30/21 0808  ? 01/24/21 1100  ceFEPIme (MAXIPIME) 2 g in sodium chloride 0.9 % 100 mL IVPB  Status:  Discontinued       ? 2 g ?200 mL/hr over 30 Minutes Intravenous Every 8 hours 01/24/21 1025 01/27/21 1403  ? 01/15/21 1630  piperacillin-tazobactam (ZOSYN) IVPB 3.375 g       ? 3.375 g ?12.5 mL/hr over 240 Minutes Intravenous Every 8 hours 01/15/21 0943 01/22/21 0400  ? 01/15/21 1030  piperacillin-tazobactam (ZOSYN) IVPB 3.375 g       ? 3.375 g ?100 mL/hr over 30 Minutes Intravenous  Once 01/15/21 0943 01/15/21 1035  ? 01/05/21 1600  ceFAZolin (ANCEF) IVPB 2g/100 mL premix       ? 2 g ?200 mL/hr over 30 Minutes Intravenous Every 8 hours 01/05/21 0853 01/10/21 0848  ? 01/03/21 0830  piperacillin-tazobactam (ZOSYN) IVPB 3.375 g  Status:  Discontinued       ? 3.375 g ?12.5 mL/hr over 240 Minutes Intravenous Every 8 hours 01/03/21 0800 01/05/21 0853  ? ?  ? ? ? ?Assessment/Plan ?Possible SBO  ?Vomiting ?Hx PEG ?-No current indication for urgent/emergent surgery ?-Please continue to hold tube feeds.  Can place PEG tube to gravity ?-Obtain CT A/P w/ IV contrast and p.o. contrast via PEG tube  ?-Will follow up on results. Further recs to follow.  ? ? LOS: 101 days  ? ? ?Jillyn Ledger , PA-C ?[facility] ?04/10/2021, 12:17 PM ?Please see Amion for pager number during day hours 7:00am-4:30pm ? ?

## 2021-04-10 NOTE — Progress Notes (Addendum)
Progress Note   Patient: Stephen Delgado ZOX:[id number] DOB: 09/28/1965 DOA: 12/30/2020     101 DOS: the patient was seen and examined on 04/10/2021      Brief hospital course: 56 y.o. male with PMH significant for DM2, HTN, CKD with questionable compliance to medications Patient presented to the ED on 12/30/2020 with complaint of headache, altered mental status   In the ED, he was hypertensive to 238/160, agitated, required restraints CT head showed large amount of intraventricular hemorrhage involving lateral/third/fourth ventricle, ICH and left basal ganglia as well as chronic microvascular ischemic changes. He was started on Cleviprex drip and admitted to neuro ICU   He subsequently had CT head repeated on 11/30, 12/1 and 12/3, all demonstrated unchanged intraparenchymal hemorrhage and intraventricular hemorrhage 12/3, MRI brain from also showed numerous small acute infarcts in both cerebral hemisphere with minimal improvement in the right superior cerebellum 12/3, intubated 12/13, tracheostomy 12/15, wean from vent to trach collar 12/16, PEG tube placement 12/16, repeat MRI showed an increase in the size and number of acute infarcts in the bilateral hemispheres with increased involvement in the cerebellum bilaterally.   12/18, [facility]  12/21, palliative care consulted.  Family wanted to continue aggressive care as full CODE STATUS. 12/23 developed Cheyne-Stokes respiratory pattern with respiratory alkalosis.  Transferred to ICU for short-term mechanical ventilation and was found to have new tracheobronchitis now on cefepime 12/25 [facility]   His hospital course has been complicated by significant neurological impairment, aspiration events, AKI, hypernatremia, urinary retention. 1/9 trach changed to #6.0 Shiley cuffless--1/10 > 30 days since trach inserted.  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   Mobility Assessment (last 72 hours)     Mobility Assessment     Row Name 03/29/21 2000 03/29/21 1800 03/29/21 1400 03/29/21 1200 03/29/21 0800   Does patient have an order for bedrest or is patient medically unstable Yes- Bedfast (Level 1) - Complete Yes- Bedfast (Level 1) - Complete Yes- Bedfast (Level 1) - Complete Yes- Bedfast (Level 1) - Complete Yes- Bedfast (Level 1) - Complete   What is the highest level of mobility based on the progressive mobility assessment? Level 1 (Bedfast) - Unable to balance while sitting on edge of bed -- Level 1 (Bedfast) - Unable to balance while sitting on edge of bed Level 1 (Bedfast) - Unable to balance while sitting on edge of bed Level 1 (Bedfast) - Unable to balance while sitting on edge of bed   Is the above level different from baseline mobility prior to current illness? No - Consider discontinuing PT/OT -- Yes - Recommend PT order Yes - Recommend PT order Yes - Recommend PT order    Row Name 03/28/21 2000 03/28/21 0800         Does patient have an order for bedrest or is patient medically unstable Yes- Bedfast (Level 1) - Complete Yes- Bedfast (Level 1) - Complete      What is the highest level of mobility based on the progressive mobility assessment? Level 1 (Bedfast) - Unable to balance while sitting on edge of bed Level 1 (Bedfast) - Unable to balance while  sitting on edge of bed      Is the above level different from baseline mobility prior to current illness? No - Consider discontinuing PT/OT No - Consider discontinuing PT/OT                SBO (small bowel obstruction)  (HCC) Developed emesis and abdominal distention on the afternoon of 3/9 Symptoms worsen.  KUB revealed SBO.  Night call physician placed surgical consultation which is currently in progress Initiate IV fluids.  Obtain CT abdomen/pelvis with and without contrast. Once CT completed pending results may need to place G-tube to straight drain Check CMET, CBC and lactic acid  **CT w/o SBO BUT does demonstrate moderate stool in the colon. 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 (recent MSSA and serratia pneumonia; recent Klebsiella tracheobronchitis); Continue routine trach care Does not require suctioning   Malignant hypertension Continue Norvasc Coreg, hydralazine, and clonidine Current BP readings in the controlled range  Diabetes mellitus type 2 in obese (HCC) -A1c 6.3 in 12/31/2020 -Continue SSI every 4 hours -CBGs well controlled       Acute hypernatremia secondary to laxative induced diarrhea Recent episode 2/2 volume loss from diarrhea after aggressive bowel prep for severe obstipation Na 141 on 3/1 Had resolved but after frequency of free H2O boluses decreased Na back up to 146 w/ concurrent mild elevation in BUN 3/3 Increase free H2O to q 2 hrs 3/8 abdomen more distended so will increase MiraLAX to BID and begin low-dose daily Repeat electrolyte panel in the a.m.   Acute conjunctivitis, bilateral On exam today noted with bilateral scleral injection and yellow drainage accumulating on lashes and inner canthus Begin tobramycin ophthalmic to both eyes every 6 hours  Pneumonia due to Klebsiella pneumoniae (HCC)-resolved as of 04/01/2021 Resolved  Protein-calorie malnutrition/dysphagia due to recent stroke/constipation Nutrition Problem: Inadequate oral intake Etiology:  lethargy/confusion, dysphagia Signs/Symptoms: NPO status Interventions: Tube feeding via PEG Body mass index is 23.33 kg/m.  Continue Glucerna     Acute urinary retention-resolved as of 03/30/2021 -Continue Urecholine -Foley catheter discontinued on 1/11 and patient voiding easily  Transaminitis -2/2 Zyprexa -Resolved  MSSA (methicillin susceptible Staphylococcus aureus) pneumonia (HCC)-resolved as of 04/02/2021 Resolved  Pressure injury of skin-resolved as of 04/02/2021 Resolved-prior anus ulcer  Pneumonia due to Serratia marcescens (HCC)-resolved as of 04/01/2021 Resolved  Acute kidney injury on CKD 3b/acute azotemia with mild hypernatremia -Baseline renal function: BUN 18 and cr 1.49 -Recent cr 1.06 -Na stable-decrease free water to 200 cc q 4 hrs -Repeat BMET in am  Constipation Constipation has resolved, patient currently with diarrhea, lactulose discontinued        Subjective:  Unresponsive  Physical Exam: Vitals:   04/10/21 0745 04/10/21 0806 04/10/21 1127 04/10/21 1207  BP:  (!) 146/79  (!) 145/81  Pulse: 91 94 93 94  Resp: 18 14 15 16   Temp:  98.3 F (36.8 C)  98.3 F (36.8 C)  TempSrc:  Oral  Oral  SpO2: 98% 98% 98% 100%  Weight:      Height:       Constitutional: No acute distress, unresponsive Eyes: Bilateral injection with thick yellow drainage accumulating on the lashes in the inner canthus Respiratory: #6.0 Shiley cuffless trach, room air- lung sounds CTA bilaterally---normal respiratory pattern  Cardiovascular: S1-S2, NSR, normotensive -no bilateral lower extremity edema  Abdomen:  Abdomen soft and nontender but does appear to be somewhat distended but not significantly.  Bowel sounds unable to be auscultated PEG tube -normoactive bowels -LBM 3/9 Neurologic: Upper extremities are flaccid to PROM.-No further hypertonicity of lower extremities elicited with PROM.  Psychiatric: Unresponsive  Data Reviewed: There are no new results to review at  this time.  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  At this time disposition remains in flux with TOC communicating on a regular basis with long-term care facilities as well as with the patient's primary insurance company.     COVID vaccination status:  Unknown  Consultants: Neurology PCCM Medicine   Procedures: Echocardiogram EEG Cortrack PEG tube placement Tracheostomy tube placement   Planned Discharge Destination:  Skilled nursing facility 3/1 [facility] has offered a bed- awaiting insurance auth-wife needs to FU w/ Medicaid re apparent need to spend down assets to  allow pt to be eligible for Medicaid     Time spent: 15 minutes  Author: Junious Silk, NP 04/10/2021 1:40 PM  For on call review [email or web address].

## 2021-04-10 NOTE — Consult Note (Incomplete)
Consult Note  Stephen Delgado 09-30-1965  [id number].    Requesting MD: Inda Merlin, MD Chief Complaint/Reason for Consult: SBO HPI:  56 year old male with medical history of DM, HTN, CKD3 who presented to ED 11/29 for confusion and headache with work up showing acute ICH and IVH with bilateral lacunar infarcts and was admitted to the stroke team with CCM following as well. During admission, due to acute respiratory failure he has been on ventilator in the ICU with tracheostomy placement 12/13. PEG placed by trauma team on 12/16. Palliative was consulted 12/21 after repeat MRI showed increase in size and number of acute infarcts and family continued to desire FULL CODE and aggressive care. Patient has had a prolonged hospital course. Yesterday was noted to vomit tube feedings, these were held and abdominal film obtained which was concerning for possible SBO. General surgery consulted.   ROS: Review of Systems  Unable to perform ROS: Mental acuity   Family History  Problem Relation Age of Onset   Stroke Paternal Uncle     Past Medical History:  Diagnosis Date   Acute respiratory failure ([place])    a. 12/2020 in setting of IVH/strokes-->s/p trach.   Allergy    Bilateral Embolic strokes ([place])    a. 01/2021 MRI: numerous sm acute infarcts in bilat cerebral hemispheres, Lt caudate hemorrhage, IVH, scattered sulcal SAH, mild communicating hydrocephalus.   CKD (chronic kidney disease), stage III (HCC)    Diastolic dysfunction    a. 04/2015 Echo: EF 50-55%, mod LVH, no rwma, GrI DD, triv AI, + ASD; b. 12/2020 Echo: EF 50-55%, no rwma, grIDD, nl RV fxn, triv MR, mild AI, Asc Ao 84mm.   Hypertension    Intraventricular hemorrhage ([place])    a. 12/2020 Aucte L basal ganglia intraparenchymal hemorrhage w/ IVH in the setting of hypertensive emergency.   Type II diabetes mellitus [facility]: [facility] [name]     Past Surgical History:  Procedure Laterality Date   ESOPHAGOGASTRODUODENOSCOPY N/A 01/16/2021    Procedure: ESOPHAGOGASTRODUODENOSCOPY (EGD);  Surgeon: Georganna Skeans, MD;  Location: [facility];  Service: Endoscopy;  Laterality: N/A;   NO PAST SURGERIES     PEG PLACEMENT N/A 01/16/2021   Procedure: PERCUTANEOUS ENDOSCOPIC GASTROSTOMY (PEG) PLACEMENT;  Surgeon: Georganna Skeans, MD;  Location: [place];  Service: Endoscopy;  Laterality: N/A;    Social History:  reports that he has never smoked. He has never used smokeless tobacco. He reports current alcohol use. He reports that he does not use drugs.  Allergies: No Known Allergies  Medications Prior to Admission  Medication Sig Dispense Refill   amLODipine (NORVASC) 10 MG tablet Take 1 tablet (10 mg total) by mouth daily. 90 tablet 3   aspirin 81 MG EC tablet Take 81 mg by mouth daily. Swallow whole.     metoprolol succinate (TOPROL-XL) 25 MG 24 hr tablet TAKE 1 TABLET BY MOUTH ONCE A DAY 30 tablet 0   zolpidem (AMBIEN) 10 MG tablet TAKE 1 TABLET BY MOUTH NIGHTLY AT BEDTIME AS NEEDED FOR SLEEP (Patient not taking: Reported on 12/30/2020) 5 tablet 0    Blood pressure (!) 146/79, pulse 94, temperature 98.3 F (36.8 C), temperature source Oral, resp. rate 14, height 6' (1.829 m), weight 83.5 kg, SpO2 98 %. Physical Exam: *** General: pleasant, WD, *** male who is laying in bed in NAD*** HEENT: head is normocephalic, atraumatic.  Sclera are noninjected.  PERRL.  Ears and nose without any masses or lesions.  Mouth is pink  and moist Heart: regular, rate, and rhythm.  Normal s1,s2. No obvious murmurs, gallops, or rubs noted.  Palpable radial and pedal pulses bilaterally Lungs: CTAB, no wheezes, rhonchi, or rales noted.  Respiratory effort nonlabored Abd: ***soft, NT, ND, +BS, no masses, hernias, or organomegaly MS: all 4 extremities are symmetrical with no cyanosis, clubbing, or edema. Skin: warm and dry with no masses, lesions, or rashes Neuro: Cranial nerves 2-12 grossly intact, sensation is normal throughout Psych: A&Ox3 with an  appropriate affect.   Results for orders placed or performed during the hospital encounter of 12/30/20 (from the past 48 hour(s))  Glucose, capillary     Status: Abnormal   Collection Time: 04/08/21 12:25 PM  Result Value Ref Range   Glucose-Capillary 163 (H) 70 - 99 mg/dL    Comment: Glucose reference range applies only to samples taken after fasting for at least 8 hours.   Comment 1 Notify RN    Comment 2 Document in Chart   Glucose, capillary     Status: Abnormal   Collection Time: 04/08/21  4:37 PM  Result Value Ref Range   Glucose-Capillary 152 (H) 70 - 99 mg/dL    Comment: Glucose reference range applies only to samples taken after fasting for at least 8 hours.   Comment 1 Notify RN    Comment 2 Document in Chart   Glucose, capillary     Status: Abnormal   Collection Time: 04/08/21  8:15 PM  Result Value Ref Range   Glucose-Capillary 145 (H) 70 - 99 mg/dL    Comment: Glucose reference range applies only to samples taken after fasting for at least 8 hours.  Glucose, capillary     Status: Abnormal   Collection Time: 04/09/21 12:18 AM  Result Value Ref Range   Glucose-Capillary 151 (H) 70 - 99 mg/dL    Comment: Glucose reference range applies only to samples taken after fasting for at least 8 hours.  Comprehensive metabolic panel     Status: Abnormal   Collection Time: 04/09/21 12:49 AM  Result Value Ref Range   Sodium 139 135 - 145 mmol/L   Potassium 4.0 3.5 - 5.1 mmol/L   Chloride 103 98 - 111 mmol/L   CO2 26 22 - 32 mmol/L   Glucose, Bld 152 (H) 70 - 99 mg/dL    Comment: Glucose reference range applies only to samples taken after fasting for at least 8 hours.   BUN 50 (H) 6 - 20 mg/dL   Creatinine, Ser 0.96 0.61 - 1.24 mg/dL   Calcium 9.4 8.9 - 10.3 mg/dL   Total Protein 6.3 (L) 6.5 - 8.1 g/dL   Albumin 2.5 (L) 3.5 - 5.0 g/dL   AST 49 (H) 15 - 41 U/L   ALT 115 (H) 0 - 44 U/L   Alkaline Phosphatase 68 38 - 126 U/L   Total Bilirubin 0.5 0.3 - 1.2 mg/dL   GFR,  Estimated >60 >60 mL/min    Comment: (NOTE) Calculated using the CKD-EPI Creatinine Equation (2021)    Anion gap 10 5 - 15    Comment: Performed at [facility], [place] [address], [place], [place] [address]  Glucose, capillary     Status: Abnormal   Collection Time: 04/09/21  4:26 AM  Result Value Ref Range   Glucose-Capillary 121 (H) 70 - 99 mg/dL    Comment: Glucose reference range applies only to samples taken after fasting for at least 8 hours.  Glucose, capillary     Status:  Abnormal   Collection Time: 04/09/21  7:54 AM  Result Value Ref Range   Glucose-Capillary 133 (H) 70 - 99 mg/dL    Comment: Glucose reference range applies only to samples taken after fasting for at least 8 hours.   Comment 1 Notify RN    Comment 2 Document in Chart   Glucose, capillary     Status: Abnormal   Collection Time: 04/09/21 11:59 AM  Result Value Ref Range   Glucose-Capillary 142 (H) 70 - 99 mg/dL    Comment: Glucose reference range applies only to samples taken after fasting for at least 8 hours.  Glucose, capillary     Status: Abnormal   Collection Time: 04/09/21  4:24 PM  Result Value Ref Range   Glucose-Capillary 162 (H) 70 - 99 mg/dL    Comment: Glucose reference range applies only to samples taken after fasting for at least 8 hours.  Glucose, capillary     Status: None   Collection Time: 04/09/21  8:19 PM  Result Value Ref Range   Glucose-Capillary 92 70 - 99 mg/dL    Comment: Glucose reference range applies only to samples taken after fasting for at least 8 hours.  Glucose, capillary     Status: Abnormal   Collection Time: 04/10/21 12:19 AM  Result Value Ref Range   Glucose-Capillary 109 (H) 70 - 99 mg/dL    Comment: Glucose reference range applies only to samples taken after fasting for at least 8 hours.  Glucose, capillary     Status: Abnormal   Collection Time: 04/10/21  4:09 AM  Result Value Ref Range   Glucose-Capillary 105 (H) 70 - 99 mg/dL    Comment: Glucose reference  range applies only to samples taken after fasting for at least 8 hours.  Glucose, capillary     Status: Abnormal   Collection Time: 04/10/21  8:33 AM  Result Value Ref Range   Glucose-Capillary 113 (H) 70 - 99 mg/dL    Comment: Glucose reference range applies only to samples taken after fasting for at least 8 hours.   DG Chest Port 1 View  Result Date: 04/09/2021 CLINICAL DATA:  Emesis distension EXAM: PORTABLE CHEST 1 VIEW COMPARISON:  03/25/2021, 02/19/2021 FINDINGS: Tracheostomy tube remains in place. Mild cardiomegaly. No focal opacity or pleural effusion. No visible pneumothorax. IMPRESSION: 1. Borderline to mild cardiomegaly. No acute interval change since 03/25/2021 Electronically Signed   By: Donavan Foil M.D.   On: 04/09/2021 19:15   DG Abd Portable 1V  Result Date: 04/09/2021 CLINICAL DATA:  Emesis and abdominal distension. EXAM: PORTABLE ABDOMEN - 1 VIEW COMPARISON:  Abdominal x-ray 03/26/2021. FINDINGS: Peg tube tip is overlying the mid abdomen, unchanged. There is diffuse gaseous distention of colon. Transverse colon is borderline dilated. There are dilated loops of small bowel in the right abdomen measuring 3.5 cm. No acute fractures are seen. IMPRESSION: 1. Dilated small bowel concerning for small bowel obstruction. Electronically Signed   By: Ronney Asters M.D.   On: 04/09/2021 19:19      Assessment/Plan Acute ICH and IVH with bilateral lacunar infarcts S/p PEG placement 12/17 Possible SBO -   FEN: *** VTE: LMWH ID: no current abx  - below per TRH -  T2DM Malignant HTN AKI on CKD 3 Tracheostomy in place with recurrent serratia tracheobronchitis  Bilateral conjunctivitis   I reviewed {Reviewed data:26882::"last 24 h vitals and pain scores","last 48 h intake and output","last 24 h labs and trends","last 24 h imaging results"}.   ,  PA-C [facility] 04/10/2021, 10:30 AM Please see Amion for pager number during day hours 7:00am-4:30pm

## 2021-04-10 NOTE — Progress Notes (Signed)
Patient ID: Stephen Delgado, male   DOB: 28-Mar-1965, 56 y.o.   MRN: [id number] ?CT below: ?IMPRESSION: ?1. No acute abdominal/pelvic findings, mass lesions or adenopathy. ?2. Incidental and likely transitory small bowel intussusception. No ?findings for bowel obstruction or inflammatory process. ?3. Stable feeding gastrostomy tube. ?4. Moderate stool throughout the colon and down into the rectum ?could suggest constipation. ?5. Patchy ground-glass opacities in the left lower lobe, not present ?on the prior study and most likely areas of inflammation. ? ?Recommend bowel regimen, no evidence of obstruction, will sign off ?

## 2021-04-10 NOTE — Assessment & Plan Note (Addendum)
Developed emesis and abdominal distention on the afternoon of 3/9 Symptoms worsen.  KUB revealed SBO.  Night call physician placed surgical consultation which is currently in progress Initiate IV fluids.  Obtain CT abdomen/pelvis with and without contrast. Once CT completed pending results may need to place G-tube to straight drain Check CMET, CBC and lactic acid  **CT w/o SBO BUT does demonstrate moderate stool in the colon. Plan is to hold TF and focus on relieving constipation; Begin sch chronulac TID and continue prior lax/stool softeners; will also begin IV Reglan

## 2021-04-11 DIAGNOSIS — E870 Hyperosmolality and hypernatremia: Secondary | ICD-10-CM | POA: Diagnosis not present

## 2021-04-11 DIAGNOSIS — R140 Abdominal distension (gaseous): Secondary | ICD-10-CM | POA: Diagnosis not present

## 2021-04-11 DIAGNOSIS — N179 Acute kidney failure, unspecified: Secondary | ICD-10-CM | POA: Diagnosis not present

## 2021-04-11 LAB — COMPREHENSIVE METABOLIC PANEL
ALT: 83 U/L — ABNORMAL HIGH (ref 0–44)
AST: 24 U/L (ref 15–41)
Albumin: 2.5 g/dL — ABNORMAL LOW (ref 3.5–5.0)
Alkaline Phosphatase: 57 U/L (ref 38–126)
Anion gap: 9 (ref 5–15)
BUN: 30 mg/dL — ABNORMAL HIGH (ref 6–20)
CO2: 26 mmol/L (ref 22–32)
Calcium: 9.7 mg/dL (ref 8.9–10.3)
Chloride: 113 mmol/L — ABNORMAL HIGH (ref 98–111)
Creatinine, Ser: 0.99 mg/dL (ref 0.61–1.24)
GFR, Estimated: >60 mL/min (ref >60)
Glucose, Bld: 137 mg/dL — ABNORMAL HIGH (ref 70–99)
Potassium: 2.9 mmol/L — ABNORMAL LOW (ref 3.5–5.1)
Sodium: 148 mmol/L — ABNORMAL HIGH (ref 135–145)
Total Bilirubin: 0.4 mg/dL (ref 0.3–1.2)
Total Protein: 6.3 g/dL — ABNORMAL LOW (ref 6.5–8.1)

## 2021-04-11 LAB — CBC WITH DIFFERENTIAL/PLATELET
Abs Immature Granulocytes: 0.04 10*3/uL (ref 0.00–0.07)
Basophils Absolute: 0 10*3/uL (ref 0.0–0.1)
Basophils Relative: 0 %
Eosinophils Absolute: 0.1 10*3/uL (ref 0.0–0.5)
Eosinophils Relative: 2 %
HCT: 30.7 % — ABNORMAL LOW (ref 39.0–52.0)
Hemoglobin: 9.2 g/dL — ABNORMAL LOW (ref 13.0–17.0)
Immature Granulocytes: 1 %
Lymphocytes Relative: 19 %
Lymphs Abs: 1.4 10*3/uL (ref 0.7–4.0)
MCH: 23.5 pg — ABNORMAL LOW (ref 26.0–34.0)
MCHC: 30 g/dL (ref 30.0–36.0)
MCV: 78.3 fL — ABNORMAL LOW (ref 80.0–100.0)
Monocytes Absolute: 0.8 10*3/uL (ref 0.1–1.0)
Monocytes Relative: 10 %
Neutro Abs: 5.3 10*3/uL (ref 1.7–7.7)
Neutrophils Relative %: 68 %
Platelets: 242 10*3/uL (ref 150–400)
RBC: 3.92 MIL/uL — ABNORMAL LOW (ref 4.22–5.81)
RDW: 16.7 % — ABNORMAL HIGH (ref 11.5–15.5)
WBC: 7.8 10*3/uL (ref 4.0–10.5)
nRBC: 0 % (ref 0.0–0.2)

## 2021-04-11 LAB — GLUCOSE, CAPILLARY
Glucose-Capillary: 133 mg/dL — ABNORMAL HIGH (ref 70–99)
Glucose-Capillary: 117 mg/dL — ABNORMAL HIGH (ref 70–99)
Glucose-Capillary: 150 mg/dL — ABNORMAL HIGH (ref 70–99)
Glucose-Capillary: 174 mg/dL — ABNORMAL HIGH (ref 70–99)
Glucose-Capillary: 108 mg/dL — ABNORMAL HIGH (ref 70–99)
Glucose-Capillary: 110 mg/dL — ABNORMAL HIGH (ref 70–99)

## 2021-04-11 LAB — MAGNESIUM: Magnesium: 2.2 mg/dL (ref 1.7–2.4)

## 2021-04-11 MED ORDER — FREE WATER
200.0000 mL | Status: DC
  Administered 2021-04-11 – 2021-04-17 (×69): 200 mL

## 2021-04-11 MED ORDER — POTASSIUM CHLORIDE 10 MEQ/100ML IV SOLN
10.0000 meq | INTRAVENOUS | Status: AC
  Administered 2021-04-11 (×4): 10 meq via INTRAVENOUS
  Filled 2021-04-11 (×4): qty 100

## 2021-04-11 MED ORDER — GLUCERNA 1.5 CAL PO LIQD
1000.0000 mL | ORAL | Status: DC
  Administered 2021-04-11 – 2021-04-28 (×15): 1000 mL
  Filled 2021-04-11 (×29): qty 1000

## 2021-04-11 MED ORDER — DEXTROSE 5 % IV SOLN: INTRAVENOUS | Status: AC

## 2021-04-11 NOTE — Progress Notes (Signed)
.rdr          [facility]                                                                              Patient Demographics  Stephen Delgado, is a 56 y.o. male, DOB - 1965-05-24, SO:[id number]  Admit date - 12/30/2020   Admitting Physician Kerney Elbe, MD  Outpatient Primary MD for the patient is Patient, [name]  Outpatient specialists:   LOS - 102  days   Medical records reviewed and are as summarized below:    Chief Complaint  Patient presents with   Altered Mental Status       Brief summary   56 y.o. male with PMH significant for DM2, HTN, CKD with questionable compliance to medications Patient presented to the ED on 12/30/2020 with complaint of headache, altered mental status   In the ED, he was hypertensive to 238/160, agitated, required restraints CT head showed large amount of intraventricular hemorrhage involving lateral/third/fourth ventricle, ICH and left basal ganglia as well as chronic microvascular ischemic changes. He was started on Cleviprex drip and admitted to neuro ICU   He subsequently had CT head repeated on 11/30, 12/1 and 12/3, all demonstrated unchanged intraparenchymal hemorrhage and intraventricular hemorrhage 12/3, MRI brain from also showed numerous small acute infarcts in both cerebral hemisphere with minimal improvement in the right superior cerebellum 12/3, intubated 12/13, tracheostomy 12/15, wean from vent to trach collar 12/16, PEG tube placement 12/16, repeat MRI showed an increase in the size and number of acute infarcts in the bilateral hemispheres with increased involvement in the cerebellum bilaterally.   12/18, transferred from neuro service to [facility]  12/21, palliative care consulted.  Family wanted to continue aggressive care as full CODE STATUS. 12/23 developed Cheyne-Stokes respiratory pattern with respiratory alkalosis.  Transferred to ICU for short-term mechanical ventilation and was found to have new  tracheobronchitis now on cefepime 12/25 back to progressive floor and to [facility]   His hospital course has been complicated by significant neurological impairment, aspiration events, AKI, hypernatremia, urinary retention. 1/9 trach changed to #6.0 Shiley cuffless--1/10 > 30 days since trach inserted.     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  KUB revealed SBO -CT abdomen showed no acute abdominal/pelvic findings.  Incidental likely transitory small bowel intussusception, no findings of bowel obstruction.  Moderate stool throughout the colon and down to the rectum suggesting constipation. -General surgery was consulted, recommended bowel regimen, no evidence of obstruction -will resume tube feeds and add bowel regimen -Had 3 large BMs yesterday on 3/10, continue bowel regimen with lactulose 20 g 3 times daily and MiraLAX, Reglan -Add free water 200 cc every 2 hours, D5 drip at 100cc/hr  x 24hrs    Hypernatremia, hypokalemia -Sodium trended up to 148, change IV fluids to D5 water at 100 cc/h x 24hrs, add free water 200 cc q2h -Will reassess bmet in a.m. 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 (recent MSSA and serratia pneumonia; recent Klebsiella tracheobronchitis); -Continue trach care, scopolamine patch     Malignant hypertension -BP stable, continue Norvasc, Coreg, hydralazine     Acute kidney injury on CKD  -Baseline creatinine ~1.4 -Creatinine 0.9, stable, continue free water, D5W    Diabetes mellitus type 2 in obese (HCC) -A1c 6.3 in 12/31/2020 -Continue SSI every 4 hours -CBGs well  controlled      Acute conjunctivitis, bilateral -Continue tobramycin ophthalmic eyedrops    Transaminitis -Likely due to Zyprexa, resolved  Protein-calorie malnutrition/dysphagia due to recent stroke/constipation -Continue tube feeds via PEG tube      Code Status: full  DVT Prophylaxis:  enoxaparin (LOVENOX) injection 40 mg Start: 03/21/21 1030   Level of Care: Level of care: Med-Surg Family Communication:  Disposition Plan:     Status is: Inpatient Remains inpatient appropriate because: [facility] assisting with placement.  Patient was seen by palliative medicine during this admission, goal for long-term placement in skilled care.  Procedures:  Echocardiogram EEG Cortrack PEG tube placement Tracheostomy tube placement  Consultants:   Neurology CCM   Antimicrobials:   Anti-infectives (From admission, onward)    Start     Dose/Rate Route Frequency Ordered Stop   02/03/21 2200  ceFEPIme (MAXIPIME) 2 g in sodium chloride 0.9 % 100 mL IVPB        2 g 200 mL/hr over 30 Minutes Intravenous Every 8 hours 02/03/21 1153 02/05/21 2142   01/30/21 1000  ceFEPIme (MAXIPIME) 2 g in sodium chloride 0.9 % 100 mL IVPB  Status:  Discontinued        2 g 200 mL/hr over 30 Minutes Intravenous Every 12 hours 01/30/21 0832 02/03/21 1153   01/27/21 1900  sulfamethoxazole-trimethoprim (BACTRIM DS) 800-160 MG per tablet 1 tablet  Status:  Discontinued        1 tablet Per Tube Every 12 hours 01/27/21 1403 01/30/21 0808   01/24/21 1100  ceFEPIme (MAXIPIME) 2 g in sodium chloride 0.9 % 100 mL IVPB  Status:  Discontinued        2 g 200 mL/hr over 30 Minutes Intravenous Every 8 hours 01/24/21 1025 01/27/21 1403   01/15/21 1630  piperacillin-tazobactam (ZOSYN) IVPB 3.375 g        3.375 g 12.5 mL/hr over 240 Minutes Intravenous Every 8 hours 01/15/21 0943 01/22/21 0400   01/15/21 1030  piperacillin-tazobactam (ZOSYN) IVPB 3.375 g        3.375 g 100 mL/hr over 30 Minutes Intravenous  Once 01/15/21  0943 01/15/21 1035   01/05/21 1600  ceFAZolin (ANCEF) IVPB 2g/100 mL premix        2 g 200 mL/hr over 30 Minutes Intravenous Every 8 hours 01/05/21 0853 01/10/21 0848   01/03/21 0830  piperacillin-tazobactam (ZOSYN) IVPB 3.375 g  Status:  Discontinued        3.375 g 12.5 mL/hr over 240 Minutes Intravenous Every 8 hours 01/03/21 0800 01/05/21 0853          Medications  Scheduled Meds:  amantadine  100 mg Per Tube BID   amLODipine  10 mg Per Tube QHS   baclofen  10 mg Per Tube TID AC & HS   carvedilol  25 mg Per Tube BID WC   chlorhexidine  15 mL Mouth Rinse BID   enoxaparin (LOVENOX) injection  40 mg Subcutaneous Daily   hydrALAZINE  100 mg Per Tube Q8H   insulin aspart  0-20 Units Subcutaneous Q4H  lactulose  20 g Oral TID   mouth rinse  15 mL Mouth Rinse q12n4p   metoCLOPramide (REGLAN) injection  5 mg Intravenous Q6H   nutrition supplement (JUVEN)  1 packet Per Tube BID BM   OLANZapine  2.5 mg Per Tube QHS   pantoprazole sodium  40 mg Per Tube Daily   polyethylene glycol  17 g Per Tube BID   polyvinyl alcohol  2 drop Left Eye Q6H   scopolamine  1 patch Transdermal Q72H   tobramycin  2 drop Both Eyes Q6H   Continuous Infusions:  dextrose 5 % and 0.9% NaCl 100 mL/hr at 04/11/21 0715   PRN Meds:.[DISCONTINUED] acetaminophen **OR** acetaminophen (TYLENOL) oral liquid 160 mg/5 mL **OR** acetaminophen, guaiFENesin, hydrALAZINE, influenza vac split quadrivalent PF, ondansetron (ZOFRAN) IV      Subjective:   Stephen Delgado was seen and examined today.   Overnight no acute events, in persistent vegetative state.  Not responding to any verbal commands   Objective:   Vitals:   04/11/21 0441 04/11/21 0814 04/11/21 0840 04/11/21 1234  BP:  (!) 158/71  (!) 153/77  Pulse: 92 100 94 94  Resp: 18 18 18 18   Temp:  98.3 F (36.8 C)  98.9 F (37.2 C)  TempSrc:  Oral  Oral  SpO2: 99% 100% 97% 100%  Weight:      Height:        Intake/Output Summary (Last 24 hours) at  04/11/2021 1503 Last data filed at 04/11/2021 0429 Gross per 24 hour  Intake 1535.36 ml  Output 1750 ml  Net -214.64 ml     Wt Readings from Last 3 Encounters:  03/16/21 83.5 kg  02/06/20 89.1 kg  08/18/17 81.3 kg   Physical Exam General: Unresponsive, NAD, trach Cardiovascular: S1 S2 clear, RRR. No pedal edema b/l Respiratory: Diminished breath sound at the bases Gastrointestinal: Soft, PEG tube, mildly distended Ext: no pedal edema bilaterally Neuro: does not follow any commands   Data Reviewed:  I have personally reviewed following labs and imaging studies    Radiology Reports No recent imaging data  Lab Data:  CBC: Recent Labs  Lab 04/10/21 1045 04/11/21 0155  WBC 9.2 7.8  NEUTROABS 6.5 5.3  HGB 9.9* 9.2*  HCT 32.0* 30.7*  MCV 77.9* 78.3*  PLT 243 [id number]   Basic Metabolic Panel: Recent Labs  Lab 04/09/21 0049 04/10/21 1045 04/11/21 0155  NA 139 143 148*  K 4.0 3.6 2.9*  CL 103 107 113*  CO2 26 25 26   GLUCOSE 152* 111* 137*  BUN 50* 41* 30*  CREATININE 0.96 0.93 0.99  CALCIUM 9.4 9.7 9.7   GFR: Estimated Creatinine Clearance: 90.4 mL/min (by C-G formula based on SCr of 0.99 mg/dL). Liver Function Tests: Recent Labs  Lab 04/09/21 0049 04/10/21 1045 04/11/21 0155  AST 49* 35 24  ALT 115* 103* 83*  ALKPHOS 68 58 57  BILITOT 0.5 0.6 0.4  PROT 6.3* 6.7 6.3*  ALBUMIN 2.5* 2.6* 2.5*    CBG: Recent Labs  Lab 04/10/21 2043 04/11/21 0107 04/11/21 0400 04/11/21 0810 04/11/21 1230  GLUCAP 96 133* 117* 150* 174*     Dezhane Staten M.D. [facility] 04/11/2021, 3:03 PM  Available via Epic secure chat 7am-7pm After 7 pm, please refer to night coverage provider listed on amion.

## 2021-04-12 DIAGNOSIS — N179 Acute kidney failure, unspecified: Secondary | ICD-10-CM | POA: Diagnosis not present

## 2021-04-12 DIAGNOSIS — J9601 Acute respiratory failure with hypoxia: Secondary | ICD-10-CM | POA: Diagnosis not present

## 2021-04-12 DIAGNOSIS — R140 Abdominal distension (gaseous): Secondary | ICD-10-CM | POA: Diagnosis not present

## 2021-04-12 DIAGNOSIS — E870 Hyperosmolality and hypernatremia: Secondary | ICD-10-CM | POA: Diagnosis not present

## 2021-04-12 LAB — MAGNESIUM: Magnesium: 1.9 mg/dL (ref 1.7–2.4)

## 2021-04-12 LAB — BASIC METABOLIC PANEL
Anion gap: 8 (ref 5–15)
BUN: 28 mg/dL — ABNORMAL HIGH (ref 6–20)
CO2: 23 mmol/L (ref 22–32)
Calcium: 9.5 mg/dL (ref 8.9–10.3)
Chloride: 112 mmol/L — ABNORMAL HIGH (ref 98–111)
Creatinine, Ser: 1 mg/dL (ref 0.61–1.24)
GFR, Estimated: >60 mL/min (ref >60)
Glucose, Bld: 155 mg/dL — ABNORMAL HIGH (ref 70–99)
Potassium: 3.1 mmol/L — ABNORMAL LOW (ref 3.5–5.1)
Sodium: 143 mmol/L (ref 135–145)

## 2021-04-12 LAB — GLUCOSE, CAPILLARY
Glucose-Capillary: 119 mg/dL — ABNORMAL HIGH (ref 70–99)
Glucose-Capillary: 119 mg/dL — ABNORMAL HIGH (ref 70–99)
Glucose-Capillary: 130 mg/dL — ABNORMAL HIGH (ref 70–99)
Glucose-Capillary: 134 mg/dL — ABNORMAL HIGH (ref 70–99)
Glucose-Capillary: 145 mg/dL — ABNORMAL HIGH (ref 70–99)
Glucose-Capillary: 179 mg/dL — ABNORMAL HIGH (ref 70–99)

## 2021-04-12 MED ORDER — POTASSIUM CHLORIDE 10 MEQ/100ML IV SOLN
10.0000 meq | INTRAVENOUS | Status: AC
  Administered 2021-04-12 (×3): 10 meq via INTRAVENOUS
  Filled 2021-04-12 (×3): qty 100

## 2021-04-12 MED ORDER — LACTULOSE 10 GM/15ML PO SOLN
20.0000 g | Freq: Three times a day (TID) | ORAL | Status: DC
  Administered 2021-04-12 – 2021-04-29 (×43): 20 g
  Filled 2021-04-12 (×47): qty 30

## 2021-04-12 NOTE — Progress Notes (Signed)
.[email or web address]          [facility]                                                                              Patient Demographics  Stephen Delgado, is a 56 y.o. male, DOB - 07/31/1965, SO:[id number]  Admit date - 12/30/2020   Admitting Physician Kerney Elbe, MD  Outpatient Primary MD for the patient is Patient, [name]  Outpatient specialists:   LOS - 103  days   Medical records reviewed and are as summarized below:    Chief Complaint  Patient presents with   Altered Mental Status       Brief summary   56 y.o. male with PMH significant for DM2, HTN, CKD with questionable compliance to medications Patient presented to the ED on 12/30/2020 with complaint of headache, altered mental status   In the ED, he was hypertensive to 238/160, agitated, required restraints CT head showed large amount of intraventricular hemorrhage involving lateral/third/fourth ventricle, ICH and left basal ganglia as well as chronic microvascular ischemic changes. He was started on Cleviprex drip and admitted to neuro ICU   He subsequently had CT head repeated on 11/30, 12/1 and 12/3, all demonstrated unchanged intraparenchymal hemorrhage and intraventricular hemorrhage 12/3, MRI brain from also showed numerous small acute infarcts in both cerebral hemisphere with minimal improvement in the right superior cerebellum 12/3, intubated 12/13, tracheostomy 12/15, wean from vent to trach collar 12/16, PEG tube placement 12/16, repeat MRI showed an increase in the size and number of acute infarcts in the bilateral hemispheres with increased involvement in the cerebellum bilaterally.   12/18, transferred from neuro service to [facility]  12/21, palliative care consulted.  Family wanted to continue aggressive care as full CODE STATUS. 12/23 developed Cheyne-Stokes respiratory pattern with respiratory alkalosis.  Transferred to ICU for short-term mechanical ventilation and was found to have new  tracheobronchitis now on cefepime 12/25 back to progressive floor and to [facility]   His hospital course has been complicated by significant neurological impairment, aspiration events, AKI, hypernatremia, urinary retention. 1/9 trach changed to #6.0 Shiley cuffless--1/10 > 30 days since trach inserted.     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  KUB revealed SBO -CT abdomen showed no acute abdominal/pelvic findings.  Incidental likely transitory small bowel intussusception, no findings of bowel obstruction.  Moderate stool throughout the colon and down to the rectum suggesting constipation. -General surgery was consulted, recommended bowel regimen, no evidence of obstruction -will resume tube feeds and add bowel regimen -Had 3 large BMs yesterday on 3/10, continue bowel regimen with lactulose 20 g 3 times daily and MiraLAX, Reglan -Added free water 200 cc every 2 hours, placed on D5 drip at 100cc/hr  x 24hrs    Hypernatremia, hypokalemia -Sodium trended up to 148, placed on free water and D5 water for 24 hours, sodium trending down to 143 -Replaced potassium   Acute respiratory failure w/tracheostomy dependence Recurrent Serratia  tracheobronchitis (recent MSSA and serratia pneumonia; recent Klebsiella tracheobronchitis); -Continue trach care, scopolamine patch     Malignant hypertension -BP stable, continue Norvasc, Coreg, hydralazine     Acute kidney injury on CKD  -Baseline creatinine ~1.4 -Creatinine stable 1.0, continue free water    Diabetes mellitus type 2 in obese (HCC) -A1c 6.3 in 12/31/2020 -Continue SSI every 4 hours -CBGs controlled      Acute conjunctivitis,  bilateral -Continue tobramycin ophthalmic eyedrops    Transaminitis -Likely due to Zyprexa, resolved  Protein-calorie malnutrition/dysphagia due to recent stroke/constipation -Continue tube feeds via PEG tube      Code Status: full  DVT Prophylaxis:  enoxaparin (LOVENOX) injection 40 mg Start: 03/21/21 1030   Level of Care: Level of care: Med-Surg Family Communication:  Disposition Plan:     Status is: Inpatient Remains inpatient appropriate because: [facility] assisting with placement.  Patient was seen by palliative medicine during this admission, goal for long-term placement in skilled care.  Procedures:  Echocardiogram EEG Cortrack PEG tube placement Tracheostomy tube placement  Consultants:   Neurology CCM   Antimicrobials:   Anti-infectives (From admission, onward)    Start     Dose/Rate Route Frequency Ordered Stop   02/03/21 2200  ceFEPIme (MAXIPIME) 2 g in sodium chloride 0.9 % 100 mL IVPB        2 g 200 mL/hr over 30 Minutes Intravenous Every 8 hours 02/03/21 1153 02/05/21 2142   01/30/21 1000  ceFEPIme (MAXIPIME) 2 g in sodium chloride 0.9 % 100 mL IVPB  Status:  Discontinued        2 g 200 mL/hr over 30 Minutes Intravenous Every 12 hours 01/30/21 0832 02/03/21 1153   01/27/21 1900  sulfamethoxazole-trimethoprim (BACTRIM DS) 800-160 MG per tablet 1 tablet  Status:  Discontinued        1 tablet Per Tube Every 12 hours 01/27/21 1403 01/30/21 0808   01/24/21 1100  ceFEPIme (MAXIPIME) 2 g in sodium chloride 0.9 % 100 mL IVPB  Status:  Discontinued        2 g 200 mL/hr over 30 Minutes Intravenous Every 8 hours 01/24/21 1025 01/27/21 1403   01/15/21 1630  piperacillin-tazobactam (ZOSYN) IVPB 3.375 g        3.375 g 12.5 mL/hr over 240 Minutes Intravenous Every 8 hours 01/15/21 0943 01/22/21 0400   01/15/21 1030  piperacillin-tazobactam (ZOSYN) IVPB 3.375 g        3.375 g 100 mL/hr over 30 Minutes Intravenous  Once 01/15/21 0943 01/15/21 1035   01/05/21 1600   ceFAZolin (ANCEF) IVPB 2g/100 mL premix        2 g 200 mL/hr over 30 Minutes Intravenous Every 8 hours 01/05/21 0853 01/10/21 0848   01/03/21 0830  piperacillin-tazobactam (ZOSYN) IVPB 3.375 g  Status:  Discontinued        3.375 g 12.5 mL/hr over 240 Minutes Intravenous Every 8 hours 01/03/21 0800 01/05/21 0853          Medications  Scheduled Meds:  amantadine  100 mg Per Tube BID   amLODipine  10 mg Per Tube QHS   baclofen  10 mg Per Tube TID AC & HS   carvedilol  25 mg Per Tube BID WC   chlorhexidine  15 mL Mouth Rinse BID   enoxaparin (LOVENOX) injection  40 mg Subcutaneous Daily   free water  200 mL Per Tube Q2H   hydrALAZINE  100 mg Per Tube Q8H   insulin aspart  0-20 Units Subcutaneous Q4H  lactulose  20 g Oral TID   mouth rinse  15 mL Mouth Rinse q12n4p   metoCLOPramide (REGLAN) injection  5 mg Intravenous Q6H   nutrition supplement (JUVEN)  1 packet Per Tube BID BM   OLANZapine  2.5 mg Per Tube QHS   pantoprazole sodium  40 mg Per Tube Daily   polyethylene glycol  17 g Per Tube BID   polyvinyl alcohol  2 drop Left Eye Q6H   scopolamine  1 patch Transdermal Q72H   tobramycin  2 drop Both Eyes Q6H   Continuous Infusions:  dextrose 100 mL/hr at 04/12/21 0148   feeding supplement (GLUCERNA 1.5 CAL) 1,000 mL (04/11/21 1529)   PRN Meds:.[DISCONTINUED] acetaminophen **OR** acetaminophen (TYLENOL) oral liquid 160 mg/5 mL **OR** acetaminophen, guaiFENesin, hydrALAZINE, influenza vac split quadrivalent PF, ondansetron (ZOFRAN) IV      Subjective:   Stephen Delgado was seen and examined today.  No acute issues, minimally responsive, not responding to any verbal commands, in persistent vegetative state.   Objective:   Vitals:   04/12/21 0040 04/12/21 0435 04/12/21 0800 04/12/21 0856  BP:  (!) 156/75  (!) 151/80  Pulse:  92  93  Resp:  20  17  Temp: 99.1 F (37.3 C) 99.2 F (37.3 C)  98.5 F (36.9 C)  TempSrc: Axillary Axillary  Axillary  SpO2:  98% 97% 97%   Weight:      Height:        Intake/Output Summary (Last 24 hours) at 04/12/2021 1214 Last data filed at 04/11/2021 1551 Gross per 24 hour  Intake 1372.74 ml  Output 600 ml  Net 772.74 ml     Wt Readings from Last 3 Encounters:  03/16/21 83.5 kg  02/06/20 89.1 kg  08/18/17 81.3 kg    Physical Exam General: Unresponsive, trach Cardiovascular: S1 S2 clear, RRR. No pedal edema b/l Respiratory: Diminished breath sound at the bases Gastrointestinal: Soft, PEG tube, mildly distended Ext: no pedal edema bilaterally Neuro: does not follow any commands   Data Reviewed:  I have personally reviewed following labs and imaging studies    Radiology Reports No recent imaging data  Lab Data:  CBC: Recent Labs  Lab 04/10/21 1045 04/11/21 0155  WBC 9.2 7.8  NEUTROABS 6.5 5.3  HGB 9.9* 9.2*  HCT 32.0* 30.7*  MCV 77.9* 78.3*  PLT 243 [id number]   Basic Metabolic Panel: Recent Labs  Lab 04/09/21 0049 04/10/21 1045 04/11/21 0155 04/12/21 0039  NA 139 143 148* 143  K 4.0 3.6 2.9* 3.1*  CL 103 107 113* 112*  CO2 26 25 26 23   GLUCOSE 152* 111* 137* 155*  BUN 50* 41* 30* 28*  CREATININE 0.96 0.93 0.99 1.00  CALCIUM 9.4 9.7 9.7 9.5  MG  --   --  2.2 1.9   GFR: Estimated Creatinine Clearance: 89.5 mL/min (by C-G formula based on SCr of 1 mg/dL). Liver Function Tests: Recent Labs  Lab 04/09/21 0049 04/10/21 1045 04/11/21 0155  AST 49* 35 24  ALT 115* 103* 83*  ALKPHOS 68 58 57  BILITOT 0.5 0.6 0.4  PROT 6.3* 6.7 6.3*  ALBUMIN 2.5* 2.6* 2.5*    CBG: Recent Labs  Lab 04/11/21 1552 04/11/21 2003 04/12/21 0013 04/12/21 0432 04/12/21 0930  GLUCAP 108* 110* 145* 119* 179*     Stephen Delgado M.D. [facility] 04/12/2021, 12:14 PM  Available via Epic secure chat 7am-7pm After 7 pm, please refer to night coverage provider listed on amion.

## 2021-04-13 DIAGNOSIS — I6782 Cerebral ischemia: Secondary | ICD-10-CM | POA: Diagnosis not present

## 2021-04-13 DIAGNOSIS — H1033 Unspecified acute conjunctivitis, bilateral: Secondary | ICD-10-CM | POA: Diagnosis not present

## 2021-04-13 DIAGNOSIS — I10 Essential (primary) hypertension: Secondary | ICD-10-CM | POA: Diagnosis not present

## 2021-04-13 LAB — BASIC METABOLIC PANEL
Anion gap: 10 (ref 5–15)
BUN: 32 mg/dL — ABNORMAL HIGH (ref 6–20)
CO2: 23 mmol/L (ref 22–32)
Calcium: 9.2 mg/dL (ref 8.9–10.3)
Chloride: 107 mmol/L (ref 98–111)
Creatinine, Ser: 1 mg/dL (ref 0.61–1.24)
GFR, Estimated: >60 mL/min (ref >60)
Glucose, Bld: 109 mg/dL — ABNORMAL HIGH (ref 70–99)
Potassium: 3.8 mmol/L (ref 3.5–5.1)
Sodium: 140 mmol/L (ref 135–145)

## 2021-04-13 LAB — GLUCOSE, CAPILLARY
Glucose-Capillary: 105 mg/dL — ABNORMAL HIGH (ref 70–99)
Glucose-Capillary: 102 mg/dL — ABNORMAL HIGH (ref 70–99)
Glucose-Capillary: 129 mg/dL — ABNORMAL HIGH (ref 70–99)
Glucose-Capillary: 103 mg/dL — ABNORMAL HIGH (ref 70–99)
Glucose-Capillary: 90 mg/dL (ref 70–99)
Glucose-Capillary: 137 mg/dL — ABNORMAL HIGH (ref 70–99)
Glucose-Capillary: 125 mg/dL — ABNORMAL HIGH (ref 70–99)

## 2021-04-13 NOTE — Progress Notes (Signed)
? ?NAME:  Stephen Delgado, MRN:  [id number], DOB:  1965/03/16, LOS: 104 ?ADMISSION DATE:  12/30/2020, CONSULTATION DATE:  12/19 ?REFERRING MD:  Erlinda Hong, CHIEF COMPLAINT:  Dyspnea  ? ?History of Present Illness:  ?56 yo male presented with headache, nausea and altered mental status.  CT head showed acute Lt ICH with IVH likely related to hypertensive emergency (BP 195/116). Had persistent hypertension and concern for airway protection, and PCCM asked to assist with ICU management.  Tracheostomy 12/13.  Moved out of ICU.  ? ?Pertinent  Medical History  ?HTN, DM type 2, CKD 3a, OSA ? ?Significant Hospital Events: ?Including procedures, antibiotic start and stop dates in addition to other pertinent events   ?11/29 Admit, start cleviprex ?12/01 PCCM consulted; changed from cleviprex to cardene due to elevated triglycerides ?12/03 intubated; episode of vomiting >> tube feeds held ?12/04 resume trickle tube feeds ?12/06 remains minimally responsive. Tolerating SBT.  ?12/12 no acute events overnight, T-max 102 point ?12/13 bedside trach planned midmorning  ?12/14 ATC. Lasix --> 5L UOP  ?12/15 cont on trach collar. Na to 154 from 145. Adding low rate d5  ?12/16 scheduled for PEG. Cont D5. Continues on trach collar ?Out of ICU 12/19 ?12/23 Cheyne-Stokes respirations noted and was transferred to ICU for short-term ventilation.  ?12/25 Returned to floor ?1/9 Trach changed to 6 cuffless  ?2/6 Trach follow up, no major changes, remains on ATC  ?2/27 trach f/u, on TC, seems to attend briefly, otherwise poor neuro exam ?04/06/2021 Trach FU, On ATC at 28%, no change in MS, neuro exam ? ?Imaging/Studies: ?CT head 11/29 >> large amount of IVH involving lateral/3rd/4th ventricles, ICH in Lt BG, chronic microvascular ischemic changes ?Echo 11/30 >> EF 50 to 55%, mod LVH, grade 1 DD, ascending aorta 37 mm ?EEG 12/01 >> continuous generalized slowing ?MRI brain 12/03 >> numerous small acute infarcts in b/l cerebral hemispheres, Lt caudate  hemorrhage, IVH, scattered sulcal SAH, mild communicating hydrocephalus ?CT head 12/23 Expected evolution of strokes, no shift or herniation ? ? ? ?Interim History / Subjective:  ?There was question of SBO over weekend, found to have likely transitory intussusception, constipation. Bowel regimen ramped up. ? ?Oxygenation stable, secretions ok. Borderline fever 3/11 otherwise afebrile, no leukocytosis. ? ? ?Objective   ?Blood pressure 139/75, pulse 90, temperature 98.6 ?F (37 ?C), temperature source Oral, resp. rate 20, height 6' (1.829 m), weight 83.5 kg, SpO2 100 %. ?   ?FiO2 (%):  [21 %-28 %] 28 %  ? ?Intake/Output Summary (Last 24 hours) at 04/13/2021 1051 ?Last data filed at 04/13/2021 0533 ?Gross per 24 hour  ?Intake 1939.97 ml  ?Output 2250 ml  ?Net -310.03 ml  ? ?Filed Weights  ? 03/13/21 0353 03/15/21 0509 03/16/21 0500  ?Weight: 83.9 kg 83.9 kg 83.5 kg  ? ?Physical exam  ?General 56 year old male , on ATC, in NAD ?HENT 6 cuffless trach secure and intact,  w/ occasional blood-tinged secretions ?Pulm NWOB, on ATC 28%, bilateral chest excursion, faint rhonchi ?Card rrr, S1, S2,, No RMG ?Abd soft, NT, ND, BS +, Body mass index is 24.95 kg/m?. ?Ext warm , brisk capillary refill, no edema ?Neuro:  eyes open.NO focus or track, does not turn head to voice.  ? ?CT A/P 3/10 with a little ggo R lung base ? ?CMP, cbc stable ? ?No new culture data ? ?Resolved Hospital Problem list   ? ? ?Assessment & Plan:  ? 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 ? ?Maryjane Hurter, MD ?[place] ?See Amion for contact info ? ? ? ? ? ? ? ?

## 2021-04-13 NOTE — TOC Progression Note (Signed)
Transition of Care (TOC) - Progression Note  ? ? ?Patient Details  ?Name: Stephen Delgado ?MRN: [id number] ?Date of Birth: 1965/05/09 ? ?Transition of Care (TOC) CM/SW Contact  ?[name], RN ?Phone Number: ?04/13/2021, 1:33 PM ? ?Clinical Narrative:    ?CM called and left a message with Essie Hart, CM with [facility] to check on insurance authorization and bed availability for placement at the facility.  CM will follow up regarding needed placement for LTC. ? ? ?Expected Discharge Plan: Home w Home Health Services ?Barriers to Discharge: Continued Medical Work up, No SNF bed ? ?Expected Discharge Plan and Services ?Expected Discharge Plan: Home w Home Health Services ?In-house Referral: Clinical Social Work, Hospice / Palliative Care, PCP / Health Connect ?Discharge Planning Services: CM Consult ?Post Acute Care Choice: Home Health ?Living arrangements for the past 2 months: Single Family Home ?                ?DME Arranged: Hospital bed, Trach supplies, Tube feeding, Oxygen ?DME Agency: [employer] ?Date DME Agency Contacted: 03/18/21 ?Time DME Agency Contacted: 1000 ?Representative spoke with at DME Agency: Jenness Corner, CM with [employer] ?  ?  ?  ?  ?  ? ? ?Social Determinants of Health (SDOH) Interventions ?  ? ?Readmission Risk Interventions ?Readmission Risk Prevention Plan 02/20/2021  ?Transportation Screening Complete  ?Medication Review Oceanographer) Complete  ?PCP or Specialist appointment within 3-5 days of discharge Not Complete  ?PCP/Specialist Appt Not Complete comments Patient will need LTC placement at SNF facility.  ?[place] or Home Care Consult Complete  ?SW Recovery Care/Counseling Consult Complete  ?Palliative Care Screening Complete  ?Skilled Nursing Facility Complete  ?Some recent data might be hidden  ? ? ?

## 2021-04-13 NOTE — Progress Notes (Signed)
Nutrition Follow-up ? ?DOCUMENTATION CODES:  ?Severe malnutrition in context of chronic illness ? ?INTERVENTION:  ?Obtain weights weekly  ? ?Continue TF via PEG: ?-Glucerna 1.5 @ goal rate of 68m/hr (14467mday)  ?-Free water per MD/NP, currently 20085mree water Q2H ? ?At goal, TF regimen will provide 2160 kcals, 118 grams protein, 1092m46mee water (3492ml26mal free water with flushes) ? ?Recommend continue 1 packet Juven BID, each packet provides 95 calories, 2.5 grams of protein (collagen), and 9.8 grams of carbohydrate (3 grams sugar); also contains 7 grams of L-arginine and L-glutamine, 300 mg vitamin C, 15 mg vitamin E, 1.2 mcg vitamin B-12, 9.5 mg zinc, 200 mg calcium, and 1.5 g  Calcium Beta-hydroxy-Beta-methylbutyrate to support wound healing ? ?NUTRITION DIAGNOSIS:  ?Severe Malnutrition related to chronic illness as evidenced by severe muscle depletion, severe fat depletion. -- ongoing ? ?GOAL:  ?Patient will meet greater than or equal to 90% of their needs -- addressing with TF, met when TF at goal ? ?MONITOR:  ?TF tolerance, Labs, I & O's, Skin ? ?REASON FOR ASSESSMENT:  ?Ventilator, Consult ?Enteral/tube feeding initiation and management ? ?ASSESSMENT:  ?55 ye41 old male who presented to the ED on 11/29 with AMS. PMH of HTN, T2DM, CKD stage III. 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 ? ?11/30 cortrak placed; tip gastric ?12/2 trickle TF ?12/3 pt intubated due to increased WOB, +emesis, TF held ?12/4 trickle TF restarted ?12/5 advancement orders placed ?12/9 TF adjusted ?12/13 s/p tracheostomy ?12/16 s/p EGD and PEG placement ?12/19 tx out of ICU to [facility]  [facility]/23 developed Cheyne-Stokes respiratory pattern with respiratory alkalosis.Transferred to ICU for short-term mechanical ventilation and was found to have new tracheobronchitis now on cefepime ?12/25 tx back to TRH ?[place]9 trach changed to #6 shiley cuffless  ?1/10 marks 30 days since trach  placed ?2/17 trach changed   ?3/03 trach changed  ? ?Pt continues to be minimally responsive, tolerating ATC and continues to be NPO requiring TF via PEG. Per NP, pt found to have SBO late last week, though possibly resolved as pt documented to have had BM yesterday. If pt continues to have issues with tolerating TF, recommend venting G-tube and addition of J-tube for TF.  ? ?Current TF: Glucerna 1.5 @ 60ml/81m/ 200ml f36mwater Q2H ?  ?UOP: 2250ml x247murs ?I/O: -2613ml sin73mdmit ? ?Admit wt 85.2 kg ?Current wt 83.5 kg - last updated 2/13 ?Pt needs new weight ?  ?Medications: chronulac, reglan, protonix, SSI Q4H, juven BID, miralax ?Labs reviewed ?CBGs: 102-179 x 24 hours ? ?Diet Order:   ?Diet Order   ? ?       ?  Diet NPO time specified  Diet effective midnight       ?  ? ?  ?  ? ?  ? ?EDUCATION NEEDS:  ?Not appropriate for education at this time ? ?Skin:  Skin Assessment: Reviewed RN Assessment ?Skin Integrity Issues:: Stage II ?Stage II: anus ? ?Last BM:  3/12 ? ?Height:  ?Ht Readings from Last 1 Encounters:  ?01/23/21 6' (1.829 m)  ? ?Weight:  ?Wt Readings from Last 1 Encounters:  ?03/16/21 83.5 kg  ? ?BMI:  Body mass index is 24.95 kg/m?. ? ?Estimated Nutritional Needs:  ?Kcal:  2150-2350 ?Protein:  105-120 grams ?Fluid:  >2L ? ? ?Aki Burdin A.[name], LDN (she/her/hers) ?RD pager number and weekend/on-call pager number located in Amion. ? [place]

## 2021-04-13 NOTE — Progress Notes (Signed)
Progress Note   Patient: Stephen Delgado [id number] DOB: [birth year] DOA: 12/30/2020     104 DOS: the patient was seen and examined on 04/13/2021      Brief hospital course: 56 y.o. male with PMH significant for DM2, HTN, CKD with questionable compliance to medications Patient presented to the ED on 12/30/2020 with complaint of headache, altered mental status   In the ED, he was hypertensive to 238/160, agitated, required restraints CT head showed large amount of intraventricular hemorrhage involving lateral/third/fourth ventricle, ICH and left basal ganglia as well as chronic microvascular ischemic changes. He was started on Cleviprex drip and admitted to neuro ICU   He subsequently had CT head repeated on 11/30, 12/1 and 12/3, all demonstrated unchanged intraparenchymal hemorrhage and intraventricular hemorrhage 12/3, MRI brain from also showed numerous small acute infarcts in both cerebral hemisphere with minimal improvement in the right superior cerebellum 12/3, intubated 12/13, tracheostomy 12/15, wean from vent to trach collar 12/16, PEG tube placement 12/16, repeat MRI showed an increase in the size and number of acute infarcts in the bilateral hemispheres with increased involvement in the cerebellum bilaterally.   12/18, transferred from neuro service to [facility]  12/21, palliative care consulted.  Family wanted to continue aggressive care as full CODE STATUS. 12/23 developed Cheyne-Stokes respiratory pattern with respiratory alkalosis.  Transferred to ICU for short-term mechanical ventilation and was found to have new tracheobronchitis now on cefepime 12/25 back to progressive floor and to [facility]   His hospital course has been complicated by significant neurological impairment, aspiration events, AKI, hypernatremia, urinary retention. 1/9 trach changed to #6.0 Shiley cuffless--1/10 > 30 days since trach inserted.  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   Mobility Assessment (last 72 hours)     Mobility Assessment     Row Name 03/29/21 2000 03/29/21 1800 03/29/21 1400 03/29/21 1200 03/29/21 0800   Does patient have an order for bedrest or is patient medically unstable Yes- Bedfast (Level 1) - Complete Yes- Bedfast (Level 1) - Complete Yes- Bedfast (Level 1) - Complete Yes- Bedfast (Level 1) - Complete Yes- Bedfast (Level 1) - Complete   What is the highest level of mobility based on the progressive mobility assessment? Level 1 (Bedfast) - Unable to balance while sitting on edge of bed -- Level 1 (Bedfast) - Unable to balance while sitting on edge of bed Level 1 (Bedfast) - Unable to balance while sitting on edge of bed Level 1 (Bedfast) - Unable to balance while sitting on edge of bed   Is the above level different from baseline mobility prior to current illness? No - Consider discontinuing PT/OT -- Yes - Recommend PT order Yes - Recommend PT order Yes - Recommend PT order    [place] Name 03/28/21 2000 03/28/21 0800         Does patient have an order for bedrest or is patient medically unstable Yes- Bedfast (Level 1) - Complete Yes- Bedfast (Level 1) - Complete      What is the highest level of mobility based on the progressive mobility assessment? Level 1 (Bedfast) - Unable to balance while sitting on edge of bed Level 1 (Bedfast) - Unable to balance while  sitting on edge of bed      Is the above level different from baseline mobility prior to current illness? No - Consider discontinuing PT/OT No - Consider discontinuing PT/OT                SBO (small bowel obstruction)  (HCC) Developed emesis and abdominal distention on the afternoon of 3/9 Symptoms worsen.  KUB revealed SBO.  Night call physician placed surgical consultation which is currently in progress Initiate IV fluids.  Obtain CT abdomen/pelvis with and without contrast. Once CT completed pending results may need to place G-tube to straight drain Check CMET, CBC and lactic acid  **CT w/o SBO BUT does demonstrate moderate stool in the colon. 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 (recent MSSA and serratia pneumonia; recent Klebsiella tracheobronchitis); Continue routine trach care Does not require suctioning   Malignant hypertension Continue Norvasc Coreg, hydralazine, and clonidine Current BP readings in the controlled range  Diabetes mellitus type 2 in obese (HCC) -A1c 6.3 in 12/31/2020 -Continue SSI every 4 hours -CBGs well controlled       Acute hypernatremia secondary to laxative induced diarrhea Recent episode 2/2 volume loss from diarrhea after aggressive bowel prep for severe obstipation Na 141 on 3/1 Had resolved but after frequency of free H2O boluses decreased Na back up to 146 w/ concurrent mild elevation in BUN 3/3 Increase free H2O to q 2 hrs 3/8 abdomen more distended so will increase MiraLAX to BID and begin low-dose daily Repeat electrolyte panel in the a.m.   Acute conjunctivitis, bilateral On exam today noted with bilateral scleral injection and yellow drainage accumulating on lashes and inner canthus Begin tobramycin ophthalmic to both eyes every 6 hours  Pneumonia due to Klebsiella pneumoniae (HCC)-resolved as of 04/01/2021 Resolved  Protein-calorie malnutrition/dysphagia due to recent stroke/constipation Nutrition Problem: Inadequate oral intake Etiology:  lethargy/confusion, dysphagia Signs/Symptoms: NPO status Interventions: Tube feeding via PEG Body mass index is 23.33 kg/m.  Continue Glucerna     Acute urinary retention-resolved as of 03/30/2021 -Continue Urecholine -Foley catheter discontinued on 1/11 and patient voiding easily  Transaminitis -2/2 Zyprexa -Resolved  MSSA (methicillin susceptible Staphylococcus aureus) pneumonia (HCC)-resolved as of 04/02/2021 Resolved  Pressure injury of skin-resolved as of 04/02/2021 Resolved-prior anus ulcer  Pneumonia due to Serratia marcescens (HCC)-resolved as of 04/01/2021 Resolved  Acute kidney injury on CKD 3b/acute azotemia with mild hypernatremia -Baseline renal function: BUN 18 and cr 1.49 -Recent cr 1.06 -Na stable-decrease free water to 200 cc q 4 hrs -Repeat BMET in am  Constipation Constipation has resolved, patient currently with diarrhea, lactulose discontinued        Subjective:   Physical Exam: Vitals:   04/12/21 2345 04/12/21 2345 04/13/21 0400 04/13/21 0411  BP:  139/70  (!) 167/89  Pulse: 88 89 90 92  Resp: 20 (!) 23 20 (!) 24  Temp:  99.4 F (37.4 C)  98.5 F (36.9 C)  TempSrc:  Oral  Oral  SpO2: 100% 99% 100% 100%  Weight:      Height:       Constitutional: No acute distress, unresponsive Eyes: Injection and purulence right eye has resolved.  Continues to have injection and purulence in left eye.  Palpated sinuses but patient at baseline unresponsive and no response to palpation. Respiratory: #6.0 Shiley cuffless trach, room air- lung sounds CTA  bilaterally---normal respiratory pattern  Cardiovascular: S1-S2, NSR, normotensive -no bilateral lower extremity edema  Abdomen:  Abdomen soft and nontender but does appear to be somewhat distended but not significantly.  Bowel sounds unable to be auscultated PEG tube -normoactive bowels -LBM 3/12 Neurologic: Upper extremities are flaccid to PROM.-No further hypertonicity of lower extremities elicited with  PROM.  Psychiatric: Unresponsive  Data Reviewed: There are no new results to review at this time.  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  At this time disposition remains in flux with TOC communicating on a regular basis with long-term care facilities as well as with the patient's primary insurance company.     COVID vaccination status:  Unknown  Consultants: Neurology PCCM Medicine   Procedures: Echocardiogram EEG Cortrack PEG tube placement Tracheostomy tube placement   Planned Discharge Destination:  Skilled nursing facility 3/1 [facility] has offered a bed- awaiting insurance auth-wife needs to FU w/ Medicaid re apparent need to spend down assets to  allow pt to be eligible for Medicaid     Time spent: 15 minutes  Author: Erin Hearing, NP 04/13/2021 8:28 AM  For on call review [email or web address].

## 2021-04-14 ENCOUNTER — Inpatient Hospital Stay (HOSPITAL_COMMUNITY): Payer: 59

## 2021-04-14 DIAGNOSIS — H6691 Otitis media, unspecified, right ear: Secondary | ICD-10-CM

## 2021-04-14 DIAGNOSIS — R569 Unspecified convulsions: Secondary | ICD-10-CM | POA: Diagnosis not present

## 2021-04-14 DIAGNOSIS — Z930 Tracheostomy status: Secondary | ICD-10-CM | POA: Diagnosis not present

## 2021-04-14 DIAGNOSIS — I6782 Cerebral ischemia: Secondary | ICD-10-CM | POA: Diagnosis not present

## 2021-04-14 LAB — BASIC METABOLIC PANEL
Anion gap: 9 (ref 5–15)
BUN: 36 mg/dL — ABNORMAL HIGH (ref 6–20)
CO2: 21 mmol/L — ABNORMAL LOW (ref 22–32)
Calcium: 9.3 mg/dL (ref 8.9–10.3)
Chloride: 107 mmol/L (ref 98–111)
Creatinine, Ser: 0.99 mg/dL (ref 0.61–1.24)
GFR, Estimated: >60 mL/min (ref >60)
Glucose, Bld: 102 mg/dL — ABNORMAL HIGH (ref 70–99)
Potassium: 4.6 mmol/L (ref 3.5–5.1)
Sodium: 137 mmol/L (ref 135–145)

## 2021-04-14 LAB — GLUCOSE, CAPILLARY
Glucose-Capillary: 118 mg/dL — ABNORMAL HIGH (ref 70–99)
Glucose-Capillary: 118 mg/dL — ABNORMAL HIGH (ref 70–99)
Glucose-Capillary: 121 mg/dL — ABNORMAL HIGH (ref 70–99)
Glucose-Capillary: 135 mg/dL — ABNORMAL HIGH (ref 70–99)
Glucose-Capillary: 141 mg/dL — ABNORMAL HIGH (ref 70–99)
Glucose-Capillary: 142 mg/dL — ABNORMAL HIGH (ref 70–99)
Glucose-Capillary: 146 mg/dL — ABNORMAL HIGH (ref 70–99)

## 2021-04-14 MED ORDER — IOHEXOL 300 MG/ML  SOLN
100.0000 mL | Freq: Once | INTRAMUSCULAR | Status: AC | PRN
  Administered 2021-04-14: 75 mL via INTRAVENOUS

## 2021-04-14 NOTE — Assessment & Plan Note (Addendum)
Patient with recent bilateral conjunctivitis that persisted over 2 weeks despite pharmacological therapy ?Maxillofacial CT revealed new right middle ear and mastoid opacification consistent with otitis media with mastoid effusion ?Will need follow-up CT to evaluate for resolution.  If not resolved consideration should be given to ENT evaluation to determine if Stephen Delgado candidate for myringotomy tube --Right TM unremarkable and without signs of infection when examined ? ?

## 2021-04-14 NOTE — Progress Notes (Signed)
EEG complete - results pending 

## 2021-04-14 NOTE — Procedures (Signed)
Patient Name: Stephen Delgado  ?MRN: [id number]  ?Epilepsy Attending: Charlsie Quest  ?Referring Physician/Provider: Russella Dar, NP ?Date: 04/14/2021 ?Duration: 36.55 mins ?  ?Patient history: 56 year old male with ICH and IVH now with intermittent right gaze deviation.  EEG to evaluate for seizure. ?  ?Level of alertness: awake ?  ?AEDs during EEG study: None ?  ?Technical aspects: This EEG study was done with scalp electrodes positioned according to the 10-20 International system of electrode placement. Electrical activity was acquired at a sampling rate of 500Hz  and reviewed with a high frequency filter of 70Hz  and a low frequency filter of 1Hz . EEG data were recorded continuously and digitally stored.  ?  ?Description: EEG showed continuous generalized 3 to 6 Hz theta-delta slowing.  Hyperventilation and photic stimulation were not performed.    ? ?Patient was noted to have episode on 04/14/2021 at 1358 and 1425 during which patient had brief bilateral upper extremity and shoulder jerk as well as nonversive right gaze deviation when stimulated.  Concomitant EEG before, during and after the event did not show any EEG change to suggest seizure. ? ?ABNORMALITY ?- Continuous slow, generalized ?  ?IMPRESSION: ?This study is suggestive of moderate diffuse encephalopathy, nonspecific etiology. No seizures or epileptiform discharges were seen throughout the recording. ? ?Two patient events were recorded as described above without concomitant EEG change. These events were most likely not epileptic . ?  ?Stephen Delgado  ?  ?

## 2021-04-14 NOTE — TOC Progression Note (Addendum)
Transition of Care (TOC) - Progression Note  ? ? ?Patient Details  ?Name: Stephen Delgado ?MRN: [id number] ?Date of Birth: [birth year] ? ?Transition of Care (TOC) CM/SW Contact  ?Curlene Labrum, RN ?Phone Number: ?04/14/2021, 10:13 AM ? ?Clinical Narrative:    ?CM spoke with Luisa Hart, CM with [facility] and she states that she has been in communication with Vara Guardian, CM at [facility] and [facility] continues to communicate with [facility] / Medicaid for bed availability and placement.  Luisa Hart states that [facility] is supportive of offering single case agreement for SNF placement at a [facility] facility for placement.  Will continue to follow up with Doyle Askew, CM. ? ?04/14/2021 1115 - CM called and spoke with Vara Guardian, CM at [facility] and unfortunately the facility is going to retract their pending bed offer to the patient.  [facility] is being transitioned over to [facility] and they are unable to accept any pending Medicaid patients at this time - even with the [place] provider in place for placement.  ? ?I contacted Shirlee Limerick, CM at [facility] and asked that the facility review the patient for possible placement.  Shon Baton, MSW is aware. ? ?CM and MSW with DTP Team continue to follow the patient for [facility] placement. ? ? ?Expected Discharge Plan: [place] ?Barriers to Discharge: Continued Medical Work up, No SNF bed ? ?Expected Discharge Plan and Services ?Expected Discharge Plan: [place] ?In-house Referral: Clinical Social Work, Hospice / [place], PCP / Health Connect ?Discharge Planning Services: CM Consult ?Post Acute Care Choice: Home Health ?Living arrangements for the past 2 months: [place] ?                ?DME Arranged: Hospital bed, Trach supplies, Tube feeding, Oxygen ?DME Agency: [employer] ?Date DME Agency Contacted: 03/18/21 ?Time DME Agency Contacted: 1000 ?Representative spoke with at DME Agency: Bethanne Ginger, CM with  [employer] ?  ?  ?  ?  ?  ? ? ?Social Determinants of Health (SDOH) Interventions ?  ? ?Readmission Risk Interventions ?Readmission Risk Prevention Plan 02/20/2021  ?Transportation Screening Complete  ?Medication Review Press photographer) Complete  ?PCP or Specialist appointment within 3-5 days of discharge Not Complete  ?PCP/Specialist Appt Not Complete comments Patient will need LTC placement at SNF facility.  ?[place] or Home Care Consult Complete  ?SW Recovery Care/Counseling Consult Complete  ?Palliative Care Screening Complete  ?Skilled Nursing Facility Complete  ?Some recent data might be hidden  ? ? ?

## 2021-04-14 NOTE — Progress Notes (Addendum)
? ?Progress Note ? ? ?Patient: Stephen Delgado [id number] DOB: 11-Apr-1965 DOA: 12/30/2020     105 ?DOS: the patient was seen and examined on 04/14/2021 ? ? ? ?  ?Brief hospital course: ?56 y.o. male with PMH significant for DM2, HTN, CKD with questionable compliance to medications ?Patient presented to the ED on 12/30/2020 with complaint of headache, altered mental status ?  ?In the ED, he was hypertensive to 238/160, agitated, required restraints ?CT head showed large amount of intraventricular hemorrhage involving lateral/third/fourth ventricle, ICH and left basal ganglia as well as chronic microvascular ischemic changes. ?He was started on Cleviprex drip and admitted to neuro ICU ?  ?He subsequently had CT head repeated on 11/30, 12/1 and 12/3, all demonstrated unchanged intraparenchymal hemorrhage and intraventricular hemorrhage ?12/3, MRI brain from also showed numerous small acute infarcts in both cerebral hemisphere with minimal improvement in the right superior cerebellum ?12/3, intubated ?12/13, tracheostomy ?12/15, wean from vent to trach collar ?12/16, PEG tube placement ?12/16, repeat MRI showed an increase in the size and number of acute infarcts in the bilateral hemispheres with increased involvement in the cerebellum bilaterally.   ?12/18, transferred from neuro service to [facility]  ?12/21, palliative care consulted.  Family wanted to continue aggressive care as full CODE STATUS. ?12/23 developed Cheyne-Stokes respiratory pattern with respiratory alkalosis.  Transferred to ICU for short-term mechanical ventilation and was found to have new tracheobronchitis now on cefepime ?12/25 back to progressive floor and to TRH ?  ?His hospital course has been complicated by significant neurological impairment, aspiration events, AKI, hypernatremia, urinary retention. ?1/9 trach changed to #6.0 Shiley cuffless--1/10 > 30 days since trach inserted. ? 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  As a precaution we will obtain EEG ? ?A physical therapy consult is indicated based on the patient?s mobility assessment. ? ? ?Mobility Assessment (last 72 hours)   ? ? Mobility Assessment   ? ? [place] Name 03/29/21 2000 03/29/21 1800 03/29/21 1400 03/29/21 1200 03/29/21 0800  ? Does patient have an order for bedrest or is patient medically unstable Yes- Bedfast (Level 1) - Complete Yes- Bedfast (Level 1) - Complete Yes- Bedfast (Level 1) - Complete Yes- Bedfast (Level 1) - Complete Yes- Bedfast (Level 1) - Complete  ? What is the highest level of mobility based on the progressive mobility assessment? Level 1 (Bedfast) - Unable to balance while sitting on edge of bed -- Level 1 (Bedfast) - Unable to balance while sitting on edge of bed Level 1 (Bedfast) - Unable to balance while sitting on edge of bed Level 1 (Bedfast) - Unable to balance while sitting on edge of bed  ? Is the above level different from baseline mobility prior to current illness? No - Consider discontinuing PT/OT -- Yes - Recommend PT order Yes - Recommend PT order Yes - Recommend PT order  ? ? [place] Name 03/28/21 2000 03/28/21 0800  ?  ?  ?  ? Does patient have an order for bedrest or is patient medically unstable Yes- Bedfast (Level 1) - Complete Yes- Bedfast (Level 1) - Complete     ? What is the highest level of mobility based on the progressive mobility assessment? Level  1 (Bedfast) - Unable to balance while sitting on edge of bed Level 1 (Bedfast) - Unable to balance while sitting on edge of bed     ? Is the above level different from baseline mobility prior to current illness? No - Consider  discontinuing PT/OT No - Consider discontinuing PT/OT     ? ?  ?  ? ?  ? ? ? ?Acute otitis media, right with mastoid effusion ?Patient with recent bilateral conjunctivitis with some persistent injection and purulent drainage ?Patient's inability to report any symptoms a CTA of the maxillofacial area was completed that reveals new right middle ear and mastoid opacification consistent with otitis media with mastoid effusion ?Patient has demonstrated intermittent low-grade fevers since the 11th. ?In review of the literature from up-to-date typically this requires no treatment unless patient is experiencing fullness in the ear etc.  Patient is unable to describe any symptoms and given his inability to participate would not be a candidate for intranasal meds such as Afrin. ?Will eventually need follow-up CT to evaluate for resolution.  If not will require myringotomy tube evaluation from ENT ?Right TM unremarkable and without signs of infection ? ? ?SBO ruled out/constipation-resolved as of 04/14/2021 ?Developed emesis and abdominal distention on the afternoon of 3/9 ?Symptoms worsen.  KUB revealed SBO but CT revealed obstipation ?Symptoms resolved with resumption of laxatives ? ? ? ? 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 ? ?Malignant hypertension ?Continue Norvasc Coreg, hydralazine, and clonidine ?Current BP readings in the controlled range ? ?Diabetes mellitus type 2 in obese (HCC) ?-A1c 6.3 in 12/31/2020 ?-Continue SSI every 4 hours ?-CBGs well controlled ? ? ? ?  ? ?Acute hypernatremia secondary to laxative induced diarrhea ?Resolved ?Laxatives resumed ? ? ?Acute conjunctivitis, bilateral ?On exam today noted with bilateral scleral injection and yellow drainage accumulating on lashes and inner canthus ?Continue tobramycin ophthalmic  to both eyes every 6 hours ?Chane and purulent drainage has resolved from right eye but continues in left eye.  This is concerning for possible underlying sinus infection so I have ordered a maxillofacial CT. states that patient has had issues with recurrent sinus problems for many years ? ?Pneumonia due to Klebsiella pneumoniae (HCC)-resolved as of 04/01/2021 ?Resolved ? ?Protein-calorie malnutrition/dysphagia due to recent stroke/constipation ?Nutrition Problem: Inadequate oral intake ?Etiology: lethargy/confusion, dysphagia ?Signs/Symptoms: NPO status ?Interventions: Tube feeding via PEG ?Body mass index is 23.33 kg/m?Marland Kitchen  ?Continue Glucerna ? ?  ? ?Acute urinary retention-resolved as of 03/30/2021 ?-Continue Urecholine ?-Foley catheter discontinued on 1/11 and patient voiding easily ? ?Transaminitis ?-2/2 Zyprexa ?-Resolved ? ?MSSA (methicillin susceptible Staphylococcus aureus) pneumonia (HCC)-resolved as of 04/02/2021 ?Resolved ? ?Pressure injury of skin-resolved as of 04/02/2021 ?Resolved-prior anus ulcer ? ?Pneumonia due to Serratia marcescens (HCC)-resolved as of 04/01/2021 ?Resolved ? ?Acute kidney injury on CKD 3b/acute azotemia with mild hypernatremia ?-Baseline renal function: BUN 18 and cr 1.49 ?-Recent cr 1.06 ?-Na stable-decrease free water to 200 cc q 4 hrs ?-Repeat BMET in am ? ?Constipation ?Had additional episode of constipation that appeared to be consistent with SBO on plain films but CT confirmed significant colonic stool retention ?Laxatives resumed ? ? ? ? ? ? ? ?Subjective:  ?Remains unresponsive although when stimulated today noted to have some twitching behavior ? ? ?Physical Exam: ?Vitals:  ? 04/14/21 0419 04/14/21 0817 04/14/21 1128 04/14/21 1132  ?BP: (!) 157/77 (!) 150/85  (!) 150/80  ?  Pulse: 90 87 78 83  ?Resp: 20 16 17 18   ?Temp: 98.9 ?F (37.2 ?C)   98.3 ?F (36.8 ?C)  ?TempSrc: Oral   Oral  ?SpO2: 100% 99% 100% 100%  ?Weight:      ?Height:      ? ?Constitutional: No acute distress,  unresponsive ?ENT: Bilateral injection and some purulence both eyes but markedly decreased as compared to initially.  Bilateral TMs partially obscured by light wax with the left TM slightly reddened but with goo

## 2021-04-14 NOTE — Assessment & Plan Note (Deleted)
Continue tube feedings ?

## 2021-04-15 DIAGNOSIS — B309 Viral conjunctivitis, unspecified: Secondary | ICD-10-CM | POA: Diagnosis not present

## 2021-04-15 DIAGNOSIS — H6691 Otitis media, unspecified, right ear: Secondary | ICD-10-CM

## 2021-04-15 DIAGNOSIS — N179 Acute kidney failure, unspecified: Secondary | ICD-10-CM | POA: Diagnosis not present

## 2021-04-15 DIAGNOSIS — R140 Abdominal distension (gaseous): Secondary | ICD-10-CM | POA: Diagnosis not present

## 2021-04-15 DIAGNOSIS — E870 Hyperosmolality and hypernatremia: Secondary | ICD-10-CM | POA: Diagnosis not present

## 2021-04-15 LAB — GLUCOSE, CAPILLARY
Glucose-Capillary: 157 mg/dL — ABNORMAL HIGH (ref 70–99)
Glucose-Capillary: 148 mg/dL — ABNORMAL HIGH (ref 70–99)
Glucose-Capillary: 127 mg/dL — ABNORMAL HIGH (ref 70–99)
Glucose-Capillary: 110 mg/dL — ABNORMAL HIGH (ref 70–99)
Glucose-Capillary: 170 mg/dL — ABNORMAL HIGH (ref 70–99)
Glucose-Capillary: 127 mg/dL — ABNORMAL HIGH (ref 70–99)
Glucose-Capillary: 129 mg/dL — ABNORMAL HIGH (ref 70–99)
Glucose-Capillary: 110 mg/dL — ABNORMAL HIGH (ref 70–99)

## 2021-04-15 NOTE — Progress Notes (Signed)
.rdr.rdr ?      ? ? ? [facility] ?                                                                            ? ?Patient Demographics ? ?Stephen Delgado, is a 56 y.o. male, DOB - April 14, 1965, [id number] ? ?Admit date - 12/30/2020   Admitting Physician Kerney Elbe, MD ? ?Outpatient Primary MD for the patient is Patient, [name] ? ?Outpatient specialists:  ? ?LOS - 106  days  ? ?Medical records reviewed and are as summarized below: ? ? ? ?Chief Complaint  ?Patient presents with  ? Altered Mental Status  ?    ? ?Brief summary  ? ?56 y.o. male with PMH significant for DM2, HTN, CKD with questionable compliance to medications ?Patient presented to the ED on 12/30/2020 with complaint of headache, altered mental status ?  ?In the ED, he was hypertensive to 238/160, agitated, required restraints ?CT head showed large amount of intraventricular hemorrhage involving lateral/third/fourth ventricle, ICH and left basal ganglia as well as chronic microvascular ischemic changes. ?He was started on Cleviprex drip and admitted to neuro ICU ?  ?He subsequently had CT head repeated on 11/30, 12/1 and 12/3, all demonstrated unchanged intraparenchymal hemorrhage and intraventricular hemorrhage ?12/3, MRI brain from also showed numerous small acute infarcts in both cerebral hemisphere with minimal improvement in the right superior cerebellum ?12/3, intubated ?12/13, tracheostomy ?12/15, wean from vent to trach collar ?12/16, PEG tube placement ?12/16, repeat MRI showed an increase in the size and number of acute infarcts in the bilateral hemispheres with increased involvement in the cerebellum bilaterally.   ?12/18, transferred from neuro service to [facility]  ?12/21, palliative care consulted.  Family wanted to continue aggressive care as full CODE STATUS. ?12/23 developed Cheyne-Stokes respiratory pattern with respiratory alkalosis.  Transferred to ICU for short-term mechanical ventilation and was found to have new  tracheobronchitis now on cefepime ?12/25 back to progressive floor and to TRH ?  ?His hospital course has been complicated by significant neurological impairment, aspiration events, AKI, hypernatremia, urinary retention. ?1/9 trach changed to #6.0 Shiley cuffless--1/10 > 30 days since trach inserted. ?  ? ? ?Assessment & Plan  ? 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 ?Acute otitis media, right with mastoid effusion ?Recent bilateral conjunctivitis ?-CTA maxillofacial area showed new right middle ear and mastoid opacifications consistent with otitis media and mastoid effusion.  Had intermittent low-grade fevers since 3/11 ?-Follow-up CT to evaluate for resolution. If no improvement, will require ENT evaluation or possible myringotomy tube. ? ? ?Small bowel obstruction ?-Resolved as of 04/14/2021 ?-Patient had developed emesis and abdominal distention on 3/9.  KUB revealed SBO, CT showed obstipation but no obstruction ?-Surgery was consulted and recommended bowel regimen. ?-Continue bowel regimen with lactulose and MiraLAX. ? ? ? ?Hypernatremia, hypokalemia ?-Sodium trended up to 148, was placed on free water and D5  ?-sodium improved to 137 ?-Replace K as needed ? ? ?Acute respiratory failure w/tracheostomy dependence ?Recurrent Serratia tracheobronchitis (recent MSSA and serratia pneumonia; recent Klebsiella tracheobronchitis); ?-  Continue trach care, scopolamine patch ?  ?  ?Malignant hypertension ?-BP stable, continue Norvasc, Coreg, hydralazine  ? ?  ?Acute kidney injury on CKD  ?-Baseline creatinine ~1.4 ?-Creatinine stable, 0.9, continue free water ? ?  ?Diabetes mellitus type 2 in obese (HCC) ?-A1c 6.3 in 12/31/2020 ?-Continue SSI every 4 hours ?-CBGs fairly controlled ?  ? ?Transaminitis ?-Likely due to  Zyprexa, resolved ? ?Protein-calorie malnutrition/dysphagia due to recent stroke/constipation ?-Continue tube feeds via PEG tube ?-Continue free water ? ? ?  ? Code Status: full  ?DVT Prophylaxis:  enoxaparin (LOVENOX) injection 40 mg Start: 03/21/21 1030 ? ? ?Level of Care: Level of care: Med-Surg ?Family Communication:  ?Disposition Plan:     Status is: Inpatient ?Remains inpatient appropriate because: TOC assisting with placement.  Patient was seen by palliative medicine during this admission, goal for long-term placement in skilled care. ? ?Procedures:  ?Echocardiogram ?EEG ?Cortrack ?PEG tube placement ?Tracheostomy tube placement ? ?Consultants:   ?Neurology ?CCM ? ? ?Antimicrobials:  ? ? ?Medications ? ?Scheduled Meds: ? amantadine  100 mg Per Tube BID  ? amLODipine  10 mg Per Tube QHS  ? baclofen  10 mg Per Tube TID AC & HS  ? carvedilol  25 mg Per Tube BID WC  ? chlorhexidine  15 mL Mouth Rinse BID  ? enoxaparin (LOVENOX) injection  40 mg Subcutaneous Daily  ? free water  200 mL Per Tube Q2H  ? hydrALAZINE  100 mg Per Tube Q8H  ? insulin aspart  0-20 Units Subcutaneous Q4H  ? lactulose  20 g Per Tube TID  ? mouth rinse  15 mL Mouth Rinse q12n4p  ? metoCLOPramide (REGLAN) injection  5 mg Intravenous Q6H  ? nutrition supplement (JUVEN)  1 packet Per Tube BID BM  ? OLANZapine  2.5 mg Per Tube QHS  ? pantoprazole sodium  40 mg Per Tube Daily  ? polyethylene glycol  17 g Per Tube BID  ? polyvinyl alcohol  2 drop Left Eye Q6H  ? scopolamine  1 patch Transdermal Q72H  ? tobramycin  2 drop Both Eyes Q6H  ? ?Continuous Infusions: ? feeding supplement (GLUCERNA 1.5 CAL) 1,000 mL (04/12/21 1426)  ? ?PRN Meds:.[DISCONTINUED] acetaminophen **OR** acetaminophen (TYLENOL) oral liquid 160 mg/5 mL **OR** acetaminophen, guaiFENesin, hydrALAZINE, influenza vac split quadrivalent PF, ondansetron (ZOFRAN) IV ? ? ? ? ? ?Subjective:  ? ?Stephen Delgado was seen and examined today.  Unresponsive, in persistent vegetative state..   Afebrile ? ? ?Objective:  ? ?Vitals:  ? 04/15/21 0723 04/15/21 0745 04/15/21 1104 04/15/21 1159  ?BP: 132/65   (!) 150/72  ?Pulse: 84 89 84 85  ?Resp: 10 20 (!) 22 12  ?Temp: 98.3 ?F (36.8 ?C)   98.8 ?F (37.1 ?C)  ?TempSrc: Oral   Oral  ?SpO2: 97% 98% 100% 97%  ?Weight:      ?Height:      ? ? ?Intake/Output Summary (Last 24 hours) at 04/15/2021 1401 ?Last data filed at 04/15/2021 1014 ?Gross per 24 hour  ?Intake 1000 ml  ?Output 3100 ml  ?Net -2100 ml  ? ? ? ?Wt Readings from Last 3 Encounters:  ?04/13/21 108 kg  ?02/06/20 89.1 kg  ?08/18/17 81.3 kg  ? ?Physical Exam ?General: Unresponsive, trach ?Cardiovascular: S1 S2 clear, RRR. No pedal edema b/l ?Respiratory: CTAB, no wheezing, rales or rhonchi ?Gastrointestinal: Soft, nontender, mildly distended, PEG tube+ ?Ext: no pedal edema bilaterally ?Neuro: does not follow verbal commands, unresponsive ? ? ?Data Reviewed:  I have  personally reviewed following labs and imaging studies ? ? ? ?Radiology Reports ?No recent imaging data ? ?Lab Data: ? ?CBC: ?Recent Labs  ?Lab 04/10/21 ?1045 04/11/21 ?0155  ?WBC 9.2 7.8  ?NEUTROABS 6.5 5.3  ?HGB 9.9* 9.2*  ?HCT 32.0* 30.7*  ?MCV 77.9* 78.3*  ?PLT 243 242  ? ?Basic Metabolic Panel: ?Recent Labs  ?Lab 04/10/21 ?1045 04/11/21 ?0155 04/12/21 ?[id number] 04/13/21 ?0258 04/14/21 ?[id number]  ?NA 143 148* 143 140 137  ?K 3.6 2.9* 3.1* 3.8 4.6  ?CL 107 113* 112* 107 107  ?CO2 25 26 23 23  21*  ?GLUCOSE 111* 137* 155* 109* 102*  ?BUN 41* 30* 28* 32* 36*  ?CREATININE 0.93 0.99 1.00 1.00 0.99  ?CALCIUM 9.7 9.7 9.5 9.2 9.3  ?MG  --  2.2 1.9  --   --   ? ?GFR: ?Estimated Creatinine Clearance: 104.6 mL/min (by C-G formula based on SCr of 0.99 mg/dL). ?Liver Function Tests: ?Recent Labs  ?Lab 04/09/21 ?[id number] 04/10/21 ?1045 04/11/21 ?0155  ?AST 49* 35 24  ?ALT 115* 103* 83*  ?ALKPHOS [id number]  ?BILITOT 0.5 0.6 0.4  ?PROT 6.3* 6.7 6.3*  ?ALBUMIN 2.5* 2.6* 2.5*  ? ? ?CBG: ?Recent Labs  ?Lab 04/15/21 ?0213 04/15/21 ?0345 04/15/21 ?[id number] 04/15/21 ?[id number]  04/15/21 ?1201  ?GLUCAP 157* 148* 127* 110* 170*  ? ? ? ?Estill Cotta M.D. ?[facility] ?04/15/2021, 2:01 PM ? ?Available via Epic secure chat 7am-7pm ?After 7 pm, please refer to night coverage provider listed on ami

## 2021-04-15 NOTE — Progress Notes (Signed)
Orthopedic Tech Progress Note ?Patient Details:  ?Dsean Vantol Fedor ?March 24, 1965 ?[id number] ? ?Called in order to [facility] for [employer]IN[employer]  ? ?Patient ID: KINGSLEE MAIRENA, male   DOB: 01/13/1966, 56 y.o.   MRN: [id number] ? ?Donald Pore ?04/15/2021, 3:49 PM ? ?

## 2021-04-15 NOTE — Plan of Care (Signed)
Pt is alert. Total care. Pt had temp 99.5, prn tylenol given. RR were elevated but then subsided as temp came down. Pt has been turned and reposition. Pt had large bowel movement. Pt tolerated feeding well.   ? ? ?Problem: Health Behavior/Discharge Planning: ?Goal: Ability to manage health-related needs will improve ?Outcome: Progressing ?  ?Problem: Clinical Measurements: ?Goal: Ability to maintain clinical measurements within normal limits will improve ?Outcome: Progressing ?Goal: Will remain free from infection ?Outcome: Progressing ?Goal: Diagnostic test results will improve ?Outcome: Progressing ?Goal: Respiratory complications will improve ?Outcome: Progressing ?Goal: Cardiovascular complication will be avoided ?Outcome: Progressing ?  ?Problem: Activity: ?Goal: Risk for activity intolerance will decrease ?Outcome: Progressing ?  ?Problem: Nutrition: ?Goal: Adequate nutrition will be maintained ?Outcome: Progressing ?  ?Problem: Coping: ?Goal: Level of anxiety will decrease ?Outcome: Progressing ?  ?Problem: Elimination: ?Goal: Will not experience complications related to bowel motility ?Outcome: Progressing ?Goal: Will not experience complications related to urinary retention ?Outcome: Progressing ?  ?Problem: Pain Managment: ?Goal: General experience of comfort will improve ?Outcome: Progressing ?  ?Problem: Safety: ?Goal: Ability to remain free from injury will improve ?Outcome: Progressing ?  ?Problem: Skin Integrity: ?Goal: Risk for impaired skin integrity will decrease ?Outcome: Progressing ?  ?Problem: Education: ?Goal: Knowledge of disease or condition will improve ?Outcome: Progressing ?Goal: Knowledge of secondary prevention will improve (SELECT ALL) ?Outcome: Progressing ?Goal: Knowledge of patient specific risk factors will improve (INDIVIDUALIZE FOR PATIENT) ?Outcome: Progressing ?Goal: Individualized Educational Video(s) ?Outcome: Progressing ?  ?Problem: Coping: ?Goal: Will verbalize positive  feelings about self ?Outcome: Progressing ?Goal: Will identify appropriate support needs ?Outcome: Progressing ?  ?Problem: Health Behavior/Discharge Planning: ?Goal: Ability to manage health-related needs will improve ?Outcome: Progressing ?  ?Problem: Self-Care: ?Goal: Ability to participate in self-care as condition permits will improve ?Outcome: Progressing ?Goal: Verbalization of feelings and concerns over difficulty with self-care will improve ?Outcome: Progressing ?Goal: Ability to communicate needs accurately will improve ?Outcome: Progressing ?  ?Problem: Nutrition: ?Goal: Risk of aspiration will decrease ?Outcome: Progressing ?Goal: Dietary intake will improve ?Outcome: Progressing ?  ?Problem: Intracerebral Hemorrhage Tissue Perfusion: ?Goal: Complications of Intracerebral Hemorrhage will be minimized ?Outcome: Progressing ?  ?

## 2021-04-16 LAB — GLUCOSE, CAPILLARY
Glucose-Capillary: 142 mg/dL — ABNORMAL HIGH (ref 70–99)
Glucose-Capillary: 128 mg/dL — ABNORMAL HIGH (ref 70–99)
Glucose-Capillary: 130 mg/dL — ABNORMAL HIGH (ref 70–99)
Glucose-Capillary: 137 mg/dL — ABNORMAL HIGH (ref 70–99)
Glucose-Capillary: 138 mg/dL — ABNORMAL HIGH (ref 70–99)
Glucose-Capillary: 145 mg/dL — ABNORMAL HIGH (ref 70–99)
Glucose-Capillary: 130 mg/dL — ABNORMAL HIGH (ref 70–99)

## 2021-04-16 NOTE — Plan of Care (Signed)
Pt is alert, pt has been turned and dried.  Pt has had one medium bowel movement. Tube feedings continued per order. Oral care provided. No distress noted. Call button within reach.  ? ? ?Problem: Health Behavior/Discharge Planning: ?Goal: Ability to manage health-related needs will improve ?Outcome: Progressing ?  ?Problem: Clinical Measurements: ?Goal: Ability to maintain clinical measurements within normal limits will improve ?Outcome: Progressing ?Goal: Will remain free from infection ?Outcome: Progressing ?Goal: Diagnostic test results will improve ?Outcome: Progressing ?Goal: Respiratory complications will improve ?Outcome: Progressing ?Goal: Cardiovascular complication will be avoided ?Outcome: Progressing ?  ?Problem: Activity: ?Goal: Risk for activity intolerance will decrease ?Outcome: Progressing ?  ?Problem: Nutrition: ?Goal: Adequate nutrition will be maintained ?Outcome: Progressing ?  ?Problem: Coping: ?Goal: Level of anxiety will decrease ?Outcome: Progressing ?  ?Problem: Elimination: ?Goal: Will not experience complications related to bowel motility ?Outcome: Progressing ?Goal: Will not experience complications related to urinary retention ?Outcome: Progressing ?  ?Problem: Pain Managment: ?Goal: General experience of comfort will improve ?Outcome: Progressing ?  ?Problem: Safety: ?Goal: Ability to remain free from injury will improve ?Outcome: Progressing ?  ?Problem: Skin Integrity: ?Goal: Risk for impaired skin integrity will decrease ?Outcome: Progressing ?  ?Problem: Education: ?Goal: Knowledge of disease or condition will improve ?Outcome: Progressing ?Goal: Knowledge of secondary prevention will improve (SELECT ALL) ?Outcome: Progressing ?Goal: Knowledge of patient specific risk factors will improve (INDIVIDUALIZE FOR PATIENT) ?Outcome: Progressing ?Goal: Individualized Educational Video(s) ?Outcome: Progressing ?  ?Problem: Coping: ?Goal: Will verbalize positive feelings about  self ?Outcome: Progressing ?Goal: Will identify appropriate support needs ?Outcome: Progressing ?  ?Problem: Health Behavior/Discharge Planning: ?Goal: Ability to manage health-related needs will improve ?Outcome: Progressing ?  ?Problem: Self-Care: ?Goal: Ability to participate in self-care as condition permits will improve ?Outcome: Progressing ?Goal: Verbalization of feelings and concerns over difficulty with self-care will improve ?Outcome: Progressing ?Goal: Ability to communicate needs accurately will improve ?Outcome: Progressing ?  ?Problem: Nutrition: ?Goal: Risk of aspiration will decrease ?Outcome: Progressing ?Goal: Dietary intake will improve ?Outcome: Progressing ?  ?Problem: Intracerebral Hemorrhage Tissue Perfusion: ?Goal: Complications of Intracerebral Hemorrhage will be minimized ?Outcome: Progressing ?  ?

## 2021-04-16 NOTE — TOC Progression Note (Signed)
Transition of Care (TOC) - Progression Note  ? ? ?Patient Details  ?Name: Stephen Delgado ?MRN: [id number] ?Date of Birth: 10-19-1965 ? ?Transition of Care (TOC) CM/SW Contact  ?[name], RN ?Phone Number: ?04/16/2021, 8:07 AM ? ?Clinical Narrative:    ?CM spoke with Grandville Silos, [facility] supervisor to request assistance with TOC planning.  Shon Baton, MSW called and spoke with Marcelline Mates, Medicaid case worker and the patient has assets that have to be spent down before Medicaid application is approved and the patient's family is aware.  I sent a message to Kearney Hard to request assistance with possible review for [facility] DTP bed.  I called and left a message with the patient's son, [name] on the phone to discuss options for discharge - including private pay LTC placement or discharge home with patient's family for care. ? ?CM and MSW with DTP Team will continue to follow the patient for TOC needs. ? ? ?Expected Discharge Plan: Home w Home Health Services ?Barriers to Discharge: Continued Medical Work up, No SNF bed ? ?Expected Discharge Plan and Services ?Expected Discharge Plan: Home w Home Health Services ?In-house Referral: Clinical Social Work, Hospice / Palliative Care, PCP / Health Connect ?Discharge Planning Services: CM Consult ?Post Acute Care Choice: Home Health ?Living arrangements for the past 2 months: Single Family Home ?                ?DME Arranged: Hospital bed, Trach supplies, Tube feeding, Oxygen ?DME Agency: [employer] ?Date DME Agency Contacted: 03/18/21 ?Time DME Agency Contacted: 1000 ?Representative spoke with at DME Agency: Jenness Corner, CM with [employer] ?  ?  ?  ?  ?  ? ? ?Social Determinants of Health (SDOH) Interventions ?  ? ?Readmission Risk Interventions ?Readmission Risk Prevention Plan 02/20/2021  ?Transportation Screening Complete  ?Medication Review Oceanographer) Complete  ?PCP or Specialist appointment within 3-5 days of discharge Not Complete  ?PCP/Specialist  Appt Not Complete comments Patient will need LTC placement at SNF facility.  ?HRI or Home Care Consult Complete  ?SW Recovery Care/Counseling Consult Complete  ?Palliative Care Screening Complete  ?Skilled Nursing Facility Complete  ?Some recent data might be hidden  ? ? ?

## 2021-04-16 NOTE — Progress Notes (Signed)
.rdr.rdr ?      ? ? ? [facility] ?                                                                            ? ?Patient Demographics ? ?Stephen Delgado, is a 56 y.o. male, DOB - [birth year], [id number] ? ?Admit date - 12/30/2020   Admitting Physician Kerney Elbe, MD ? ?Outpatient Primary MD for the patient is Patient, [name] ? ?Outpatient specialists:  ? ?LOS - 107  days  ? ?Medical records reviewed and are as summarized below: ? ? ? ?Chief Complaint  ?Patient presents with  ? Altered Mental Status  ?    ? ?Brief summary  ? ?56 y.o. male with PMH significant for DM2, HTN, CKD with questionable compliance to medications ?Patient presented to the ED on 12/30/2020 with complaint of headache, altered mental status ?  ?In the ED, he was hypertensive to 238/160, agitated, required restraints ?CT head showed large amount of intraventricular hemorrhage involving lateral/third/fourth ventricle, ICH and left basal ganglia as well as chronic microvascular ischemic changes. ?He was started on Cleviprex drip and admitted to neuro ICU ?  ?He subsequently had CT head repeated on 11/30, 12/1 and 12/3, all demonstrated unchanged intraparenchymal hemorrhage and intraventricular hemorrhage ?12/3, MRI brain from also showed numerous small acute infarcts in both cerebral hemisphere with minimal improvement in the right superior cerebellum ?12/3, intubated ?12/13, tracheostomy ?12/15, wean from vent to trach collar ?12/16, PEG tube placement ?12/16, repeat MRI showed an increase in the size and number of acute infarcts in the bilateral hemispheres with increased involvement in the cerebellum bilaterally.   ?12/18, transferred from neuro service to [facility]  ?12/21, palliative care consulted.  Family wanted to continue aggressive care as full CODE STATUS. ?12/23 developed Cheyne-Stokes respiratory pattern with respiratory alkalosis.  Transferred to ICU for short-term mechanical ventilation and was found to have new  tracheobronchitis now on cefepime ?12/25 back to progressive floor and to TRH ?  ?His hospital course has been complicated by significant neurological impairment, aspiration events, AKI, hypernatremia, urinary retention. ?1/9 trach changed to #6.0 Shiley cuffless--1/10 > 30 days since trach inserted. ?  ? ? ?Assessment & Plan  ? 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 ?Acute otitis media, right with mastoid effusion ?Recent bilateral conjunctivitis ?-CTA maxillofacial area showed new right middle ear and mastoid opacifications consistent with otitis media and mastoid effusion.  Had intermittent low-grade fevers since 3/11 ?-Follow-up CT to evaluate for resolution. If no improvement, will require ENT evaluation or possible myringotomy tube. ? ? ?Small bowel obstruction ?-Resolved as of 04/14/2021 ?-Patient had developed emesis and abdominal distention on 3/9.  KUB revealed SBO, CT showed obstipation but no obstruction ?-Surgery was consulted and recommended bowel regimen. ?-Continue bowel regimen with lactulose and MiraLAX. ? ? ? ?Hypernatremia, hypokalemia ?-Sodium trended up to 148, was placed on free water and D5  ?-sodium improved to 137 ?-Replace K as needed ? ? ?Acute respiratory failure w/tracheostomy dependence ?Recurrent Serratia tracheobronchitis (recent MSSA and serratia pneumonia; recent Klebsiella tracheobronchitis); ?-  Continue trach care, scopolamine patch ?  ?  ?Malignant hypertension ?-BP stable, continue Norvasc, Coreg, hydralazine  ? ?  ?Acute kidney injury on CKD  ?-Baseline creatinine ~1.4 ?-Creatinine stable, 0.9, continue free water ? ?  ?Diabetes mellitus type 2 in obese (HCC) ?-A1c 6.3 in 12/31/2020 ?-Continue SSI every 4 hours ?-CBGs fairly controlled ?  ? ?Transaminitis ?-Likely due to  Zyprexa, resolved ? ?Protein-calorie malnutrition/dysphagia due to recent stroke/constipation ?-Continue tube feeds via PEG tube ?-Continue free water ? ? ?  ? Code Status: full  ?DVT Prophylaxis:  enoxaparin (LOVENOX) injection 40 mg Start: 03/21/21 1030 ? ? ?Level of Care: Level of care: Med-Surg ?Family Communication:  ?Disposition Plan:     Status is: Inpatient ?Remains inpatient appropriate because: TOC assisting with placement.  Patient was seen by palliative medicine during this admission, goal for long-term placement in skilled care. ? ?Procedures:  ?Echocardiogram ?EEG ?Cortrack ?PEG tube placement ?Tracheostomy tube placement ? ?Consultants:   ?Neurology ?CCM ? ? ?Antimicrobials:  ? ? ?Medications ? ?Scheduled Meds: ? amantadine  100 mg Per Tube BID  ? amLODipine  10 mg Per Tube QHS  ? baclofen  10 mg Per Tube TID AC & HS  ? carvedilol  25 mg Per Tube BID WC  ? chlorhexidine  15 mL Mouth Rinse BID  ? enoxaparin (LOVENOX) injection  40 mg Subcutaneous Daily  ? free water  200 mL Per Tube Q2H  ? hydrALAZINE  100 mg Per Tube Q8H  ? insulin aspart  0-20 Units Subcutaneous Q4H  ? lactulose  20 g Per Tube TID  ? mouth rinse  15 mL Mouth Rinse q12n4p  ? metoCLOPramide (REGLAN) injection  5 mg Intravenous Q6H  ? nutrition supplement (JUVEN)  1 packet Per Tube BID BM  ? OLANZapine  2.5 mg Per Tube QHS  ? pantoprazole sodium  40 mg Per Tube Daily  ? polyethylene glycol  17 g Per Tube BID  ? polyvinyl alcohol  2 drop Left Eye Q6H  ? scopolamine  1 patch Transdermal Q72H  ? tobramycin  2 drop Both Eyes Q6H  ? ?Continuous Infusions: ? feeding supplement (GLUCERNA 1.5 CAL) 1,000 mL (04/15/21 1727)  ? ?PRN Meds:.[DISCONTINUED] acetaminophen **OR** acetaminophen (TYLENOL) oral liquid 160 mg/5 mL **OR** acetaminophen, guaiFENesin, hydrALAZINE, influenza vac split quadrivalent PF, ondansetron (ZOFRAN) IV ? ? ? ? ? ?Subjective:  ? ?Stephen Delgado patient was seen and examined at his bedside.  In no acute distress.  Awake, not  interactive, and does not follow commands. ? ?Objective:  ? ?Vitals:  ? 04/16/21 0345 04/16/21 0840 04/16/21 1117 04/16/21 1515  ?BP: (!) 165/80 (!) 145/76 (!) 144/83   ?Pulse: (!) 101 95 91 84  ?Resp: 19 18 18 18   ?Temp: 99.1 ?F (37.3 ?C) 98.4 ?F (36.9 ?C) 98.7 ?F (37.1 ?C)   ?TempSrc: Oral     ?SpO2: 100% 100% 100% 100%  ?Weight:      ?Height:      ? ? ?Intake/Output Summary (Last 24 hours) at 04/16/2021 1645 ?Last data filed at 04/16/2021 [id number] ?Gross per 24 hour  ?Intake 600 ml  ?Output 700 ml  ?Net -100 ml  ? ? ? ?Wt Readings from Last 3 Encounters:  ?04/13/21 108 kg  ?02/06/20 89.1 kg  ?08/18/17 81.3 kg  ? ?Physical Exam: No significant change from prior exam. ?General: Unresponsive, trach ?Cardiovascular: S1 S2 clear, RRR. No pedal edema b/l ?Respiratory: CTAB, no wheezing, rales or rhonchi ?Gastrointestinal: Soft, nontender, mildly distended, PEG tube+ ?  Ext: no pedal edema bilaterally ?Neuro: does not follow verbal commands, unresponsive ? ? ?Data Reviewed:  I have personally reviewed following labs and imaging studies ? ? ? ?Radiology Reports ?No recent imaging data ? ?Lab Data: ? ?CBC: ?Recent Labs  ?Lab 04/10/21 ?1045 04/11/21 ?0155  ?WBC 9.2 7.8  ?NEUTROABS 6.5 5.3  ?HGB 9.9* 9.2*  ?HCT 32.0* 30.7*  ?MCV 77.9* 78.3*  ?PLT 243 242  ? ?Basic Metabolic Panel: ?Recent Labs  ?Lab 04/10/21 ?1045 04/11/21 ?0155 04/12/21 ?[id number] 04/13/21 ?0258 04/14/21 ?[id number]  ?NA 143 148* 143 140 137  ?K 3.6 2.9* 3.1* 3.8 4.6  ?CL 107 113* 112* 107 107  ?CO2 25 26 23 23  21*  ?GLUCOSE 111* 137* 155* 109* 102*  ?BUN 41* 30* 28* 32* 36*  ?CREATININE 0.93 0.99 1.00 1.00 0.99  ?CALCIUM 9.7 9.7 9.5 9.2 9.3  ?MG  --  2.2 1.9  --   --   ? ?GFR: ?Estimated Creatinine Clearance: 104.6 mL/min (by C-G formula based on SCr of 0.99 mg/dL). ?Liver Function Tests: ?Recent Labs  ?Lab 04/10/21 ?1045 04/11/21 ?0155  ?AST 35 24  ?ALT 103* 83*  ?ALKPHOS 58 57  ?BILITOT 0.6 0.4  ?PROT 6.7 6.3*  ?ALBUMIN 2.6* 2.5*  ? ? ?CBG: ?Recent Labs  ?Lab  04/15/21 ?2048 04/15/21 ?2311 04/16/21 ?0343 04/16/21 ?0840 04/16/21 ?1116  ?GLUCAP 129* 110* 142* 128* 130*  ? ? ? ?Kayleen Memos M.D. ?[facility] ?04/16/2021, 4:45 PM ? ?Available via Epic secure chat 7am-7pm ?Af

## 2021-04-17 LAB — GLUCOSE, CAPILLARY
Glucose-Capillary: 135 mg/dL — ABNORMAL HIGH (ref 70–99)
Glucose-Capillary: 143 mg/dL — ABNORMAL HIGH (ref 70–99)
Glucose-Capillary: 151 mg/dL — ABNORMAL HIGH (ref 70–99)
Glucose-Capillary: 139 mg/dL — ABNORMAL HIGH (ref 70–99)
Glucose-Capillary: 136 mg/dL — ABNORMAL HIGH (ref 70–99)

## 2021-04-17 MED ORDER — FREE WATER
200.0000 mL | Status: DC
  Administered 2021-04-17 – 2021-04-25 (×46): 200 mL

## 2021-04-17 NOTE — Progress Notes (Signed)
Patient seen today by trach team for consult. No education needed at this time.  All the necessary equipment is at bedside.  Will continue to follow for progression. ?

## 2021-04-17 NOTE — Progress Notes (Signed)
New trach has been ordered to change but has not been received at this time. Will plan to change the trach 3/18. ?

## 2021-04-17 NOTE — Progress Notes (Signed)
[id number] Hospital Liaison note: ? ?New request for [facility] Palliative Care services. Will continue to follow for disposition. ? ?Please call with any outpatient palliative questions or concerns. ? ?Thank you for the opportunity to participate in this patient's care. ?  ?Thank you, ?Dee Curry, LPN ?[facility] Hospital Liaison ?[phone] ?

## 2021-04-17 NOTE — Plan of Care (Signed)
?  Problem: Clinical Measurements: ?Goal: Will remain free from infection ?Outcome: Progressing ?Goal: Diagnostic test results will improve ?Outcome: Progressing ?Goal: Respiratory complications will improve ?Outcome: Progressing ?Goal: Cardiovascular complication will be avoided ?Outcome: Progressing ?  ?Problem: Nutrition: ?Goal: Adequate nutrition will be maintained ?Outcome: Progressing ?  ?Problem: Coping: ?Goal: Level of anxiety will decrease ?Outcome: Progressing ?  ?Problem: Elimination: ?Goal: Will not experience complications related to bowel motility ?Outcome: Progressing ?Goal: Will not experience complications related to urinary retention ?Outcome: Progressing ?  ?Problem: Pain Managment: ?Goal: General experience of comfort will improve ?Outcome: Progressing ?  ?Problem: Safety: ?Goal: Ability to remain free from injury will improve ?Outcome: Progressing ?  ?Problem: Skin Integrity: ?Goal: Risk for impaired skin integrity will decrease ?Outcome: Progressing ?  ?Problem: Intracerebral Hemorrhage Tissue Perfusion: ?Goal: Complications of Intracerebral Hemorrhage will be minimized ?Outcome: Progressing ?  ?

## 2021-04-17 NOTE — Progress Notes (Signed)
.rdr.rdr ?      ? ? ? [facility] ?                                                                            ? ?Patient Demographics ? ?Stephen Delgado, is a 56 y.o. male, DOB - 03-02-1965, [id number] ? ?Admit date - 12/30/2020   Admitting Physician Kerney Elbe, MD ? ?Outpatient Primary MD for the patient is Patient, [name] ? ?Outpatient specialists:  ? ?LOS - 108  days  ? ?Medical records reviewed and are as summarized below: ? ? ? ?Chief Complaint  ?Patient presents with  ? Altered Mental Status  ?    ? ?Brief summary  ? ?56 y.o. male with PMH significant for DM2, HTN, CKD with questionable compliance to medications ?Patient presented to the ED on 12/30/2020 with complaint of headache, altered mental status ?  ?In the ED, he was hypertensive to 238/160, agitated, required restraints ?CT head showed large amount of intraventricular hemorrhage involving lateral/third/fourth ventricle, ICH and left basal ganglia as well as chronic microvascular ischemic changes. ?He was started on Cleviprex drip and admitted to neuro ICU ?  ?He subsequently had CT head repeated on 11/30, 12/1 and 12/3, all demonstrated unchanged intraparenchymal hemorrhage and intraventricular hemorrhage ?12/3, MRI brain from also showed numerous small acute infarcts in both cerebral hemisphere with minimal improvement in the right superior cerebellum ?12/3, intubated ?12/13, tracheostomy ?12/15, wean from vent to trach collar ?12/16, PEG tube placement ?12/16, repeat MRI showed an increase in the size and number of acute infarcts in the bilateral hemispheres with increased involvement in the cerebellum bilaterally.   ?12/18, transferred from neuro service to [facility]  ?12/21, palliative care consulted.  Family wanted to continue aggressive care as full CODE STATUS. ?12/23 developed Cheyne-Stokes respiratory pattern with respiratory alkalosis.  Transferred to ICU for short-term mechanical ventilation and was found to have new  tracheobronchitis now on cefepime ?12/25 back to progressive floor and to TRH ?  ?His hospital course has been complicated by significant neurological impairment, aspiration events, AKI, hypernatremia, urinary retention. ?1/9 trach changed to #6.0 Shiley cuffless--1/10 > 30 days since trach inserted. ?  ? ? ?Assessment & Plan  ? 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 Called spouse on 04/16/21 and left voicemail message. Called son Stephen Delgado [phone] on 04/17/21 and left voicemail message. ? ?Acute otitis media, right with mastoid effusion ?Recent bilateral conjunctivitis ?-CTA maxillofacial area showed new right middle ear and mastoid opacifications consistent with otitis media and mastoid effusion.  Had intermittent low-grade fevers since 3/11 ?-Follow-up CT to evaluate for resolution. If no improvement, will require ENT evaluation or possible myringotomy tube. ? ?Small bowel obstruction ?-Resolved as of 04/14/2021 ?-Patient had developed emesis and abdominal distention on 3/9.  KUB revealed SBO, CT showed obstipation but no obstruction ?-Surgery was consulted and recommended bowel regimen. ?-Continue bowel regimen with lactulose and MiraLAX. ? ?Hypernatremia, hypokalemia ?-Sodium trended up to 148, was placed on free water and D5  ?-sodium improved to 137 ?-Replace  K as needed ? ?Acute respiratory failure w/tracheostomy dependence ?Recurrent Serratia tracheobronchitis (recent MSSA and serratia pneumonia; recent Klebsiella tracheobronchitis); ?-Continue trach care, scopolamine patch ?   ?Malignant hypertension ?-BP stable, continue Norvasc, Coreg, hydralazine  ?  ?Acute kidney injury on CKD  ?-Baseline creatinine ~1.4 ?-Creatinine stable, 0.9, continue free  water ? ?Diabetes mellitus type 2 in obese (HCC) ?-A1c 6.3 in 12/31/2020 ?-Continue SSI every 4 hours ?-CBGs fairly controlled ?  ?Transaminitis ?-Likely due to Zyprexa, resolved ? ?Protein-calorie malnutrition/dysphagia due to recent stroke/constipation ?-Continue tube feeds via PEG tube ?-Continue free water ? ? ?  ? Code Status: full  ?DVT Prophylaxis:  enoxaparin (LOVENOX) injection 40 mg Start: 03/21/21 1030 ? ? ?Level of Care: Level of care: Med-Surg ?Family Communication:  ?Disposition Plan:     Status is: Inpatient ?Remains inpatient appropriate because: TOC assisting with placement.  Patient was seen by palliative medicine during this admission, goal for long-term placement in skilled care. ? ?Procedures:  ?Echocardiogram ?EEG ?Cortrack ?PEG tube placement ?Tracheostomy tube placement ? ?Consultants:   ?Neurology ?CCM ? ? ?Antimicrobials:  ? ? ?Medications ? ?Scheduled Meds: ? amantadine  100 mg Per Tube BID  ? amLODipine  10 mg Per Tube QHS  ? baclofen  10 mg Per Tube TID AC & HS  ? carvedilol  25 mg Per Tube BID WC  ? chlorhexidine  15 mL Mouth Rinse BID  ? enoxaparin (LOVENOX) injection  40 mg Subcutaneous Daily  ? free water  200 mL Per Tube Q2H  ? hydrALAZINE  100 mg Per Tube Q8H  ? insulin aspart  0-20 Units Subcutaneous Q4H  ? lactulose  20 g Per Tube TID  ? mouth rinse  15 mL Mouth Rinse q12n4p  ? metoCLOPramide (REGLAN) injection  5 mg Intravenous Q6H  ? nutrition supplement (JUVEN)  1 packet Per Tube BID BM  ? OLANZapine  2.5 mg Per Tube QHS  ? pantoprazole sodium  40 mg Per Tube Daily  ? polyethylene glycol  17 g Per Tube BID  ? polyvinyl alcohol  2 drop Left Eye Q6H  ? scopolamine  1 patch Transdermal Q72H  ? tobramycin  2 drop Both Eyes Q6H  ? ?Continuous Infusions: ? feeding supplement (GLUCERNA 1.5 CAL) 1,000 mL (04/15/21 1727)  ? ?PRN Meds:.[DISCONTINUED] acetaminophen **OR** acetaminophen (TYLENOL) oral liquid 160 mg/5 mL **OR** acetaminophen, guaiFENesin, hydrALAZINE, influenza vac split  quadrivalent PF, ondansetron (ZOFRAN) IV ? ? ? ? ? ?Subjective:  ? ?Stephen Delgado patient was seen and examined at his bedside.  No significant changes from prior exam.  Eyes are open, does not track, not interactive, does not follow commands. ? ?Objective:  ? ?Vitals:  ? 04/17/21 0334 04/17/21 0832 04/17/21 0846 04/17/21 1128  ?BP: (!) 176/76  (!) 144/71   ?Pulse: 98 98 86 77  ?Resp:  20 (!) 23 20  ?Temp: 98.9 ?F (37.2 ?C)  99.4 ?F (37.4 ?C)   ?TempSrc: Oral  Oral   ?SpO2: 100% 99%  99%  ?Weight:      ?Height:      ? ? ?Intake/Output Summary (Last 24 hours) at 04/17/2021 1200 ?Last data filed at 04/17/2021 0700 ?Gross per 24 hour  ?Intake 1920 ml  ?Output 1650 ml  ?Net 270 ml  ? ? ? ?Wt Readings from Last 3 Encounters:  ?04/13/21 108 kg  ?02/06/20 89.1 kg  ?08/18/17 81.3 kg  ? ?Physical Exam:  ?General: Frail-appearing no interaction.  Does not follow commands. ?Cardiovascular: Regular rate and rhythm  no rubs gallops. ?Respiratory: No rhonchi's or wheezes noted.  Poor respiratory effort. ?Gastrointestinal: Soft bowel sounds present. ?Ext: Trace edema bilaterally. ?Neuro: Does not follow commands. ? ? ?Data Reviewed:  I have personally reviewed following labs and imaging studies ? ? ? ?Radiology Reports ?No recent imaging data ? ?Lab Data: ? ?CBC: ?Recent Labs  ?Lab 04/11/21 ?0155  ?WBC 7.8  ?NEUTROABS 5.3  ?HGB 9.2*  ?HCT 30.7*  ?MCV 78.3*  ?PLT 242  ? ?Basic Metabolic Panel: ?Recent Labs  ?Lab 04/11/21 ?0155 04/12/21 ?[id number] 04/13/21 ?0258 04/14/21 ?[id number]  ?NA 148* 143 140 137  ?K 2.9* 3.1* 3.8 4.6  ?CL 113* 112* 107 107  ?CO2 26 23 23  21*  ?GLUCOSE 137* 155* 109* 102*  ?BUN 30* 28* 32* 36*  ?CREATININE 0.99 1.00 1.00 0.99  ?CALCIUM 9.7 9.5 9.2 9.3  ?MG 2.2 1.9  --   --   ? ?GFR: ?Estimated Creatinine Clearance: 104.6 mL/min (by C-G formula based on SCr of 0.99 mg/dL). ?Liver Function Tests: ?Recent Labs  ?Lab 04/11/21 ?0155  ?AST 24  ?ALT 83*  ?ALKPHOS 57  ?BILITOT 0.4  ?PROT 6.3*  ?ALBUMIN 2.5*  ? ? ?CBG: ?Recent Labs   ?Lab 04/16/21 ?2027 04/16/21 ?2136 04/16/21 ?2326 04/17/21 ?[id number] 04/17/21 ?0847  ?GLUCAP 138* 145* 130* 135* 143*  ? ? ? ?Kayleen Memos M.D. ?[facility] ?04/17/2021, 12:00 PM ? ?Available via Epic secure chat 7am

## 2021-04-17 NOTE — TOC Progression Note (Addendum)
Transition of Care (TOC) - Progression Note  ? ? ?Patient Details  ?Name: Stephen Delgado ?MRN: [id number] ?Date of Birth: 1965/06/14 ? ?Transition of Care (TOC) CM/SW Contact  ?[name], RN ?Phone Number: ?04/17/2021, 10:19 AM ? ?Clinical Narrative:    ?CM was unable to reach the patient's wife or son by phone yesterday to discuss transitions of care discussion with the patient's family.  The patient has pending Medicaid but the family at this time has been unable to spend down assets to qualify for Medicaid at this time.  Grandville Silos, [facility] Supervisor spoke with Marcelline Mates, DSS worker and the patient and family have assets at this time that make the patient unable to qualify for Medicaid coverage.  I spoke with Kearney Hard, TOC leadership and asked for [facility] DTP bed but the facility is unable to offer a DTP bed at this time.  The only options for discharge availability for this patient at this time would be to have the patient's family privately pay for LTC placement - cost of 15,000-20,000 per month or make arrangements to take the patient home for care in the home.  [name] and son have stated that they do not have the funds available to pay for LTC placement and the patient's wife is unable/unwilling to quit her job at [employer] and assume care of the patient in the home.  I spoke with Dartha Lodge, RNCM earlier in the week and [facility] is unable to provide SNF placement payment outside of 2 weeks of care and at this time - this option may not be available.  The patient is in a vegetative state per MD note and the patient needs LTC plans with palliative care following.  I spoke with Dr. Catha Gosselin on the phone and explained the complex issues and barriers to discharge.  Dr. Margo Aye is aware of the above note as well.  Ethics medicine will be consulted by Dr. Margo Aye today and [facility] DTP Team will continue to have support from hospital leadership for coordination for a reasonable  discharge plan for the patient/family. ? ?04/17/2021 1117 - CM called and spoke with the patient's son on the phone to update him regarding patient's transitions of care needs and the above note.  The patient's son states that he will speak with his mother regarding our discussion.  Palliative care medicine has been re-consulted as well by the MD. ? ? ?Expected Discharge Plan: Home w Home Health Services ?Barriers to Discharge: Continued Medical Work up, No SNF bed ? ?Expected Discharge Plan and Services ?Expected Discharge Plan: Home w Home Health Services ?In-house Referral: Clinical Social Work, Hospice / Palliative Care, PCP / Health Connect ?Discharge Planning Services: CM Consult ?Post Acute Care Choice: Home Health ?Living arrangements for the past 2 months: Single Family Home ?                ?DME Arranged: Hospital bed, Trach supplies, Tube feeding, Oxygen ?DME Agency: [employer] ?Date DME Agency Contacted: 03/18/21 ?Time DME Agency Contacted: 1000 ?Representative spoke with at DME Agency: Jenness Corner, CM with [employer] ?  ?  ?  ?  ?  ? ? ?Social Determinants of Health (SDOH) Interventions ?  ? ?Readmission Risk Interventions ?Readmission Risk Prevention Plan 02/20/2021  ?Transportation Screening Complete  ?Medication Review Oceanographer) Complete  ?PCP or Specialist appointment within 3-5 days of discharge Not Complete  ?PCP/Specialist Appt Not Complete comments Patient will need LTC placement at SNF facility.  ?  HRI or Home Care Consult Complete  ?SW Recovery Care/Counseling Consult Complete  ?Palliative Care Screening Complete  ?Skilled Nursing Facility Complete  ?Some recent data might be hidden  ? ? ?

## 2021-04-18 LAB — GLUCOSE, CAPILLARY
Glucose-Capillary: 110 mg/dL — ABNORMAL HIGH (ref 70–99)
Glucose-Capillary: 117 mg/dL — ABNORMAL HIGH (ref 70–99)
Glucose-Capillary: 122 mg/dL — ABNORMAL HIGH (ref 70–99)
Glucose-Capillary: 123 mg/dL — ABNORMAL HIGH (ref 70–99)
Glucose-Capillary: 147 mg/dL — ABNORMAL HIGH (ref 70–99)
Glucose-Capillary: 150 mg/dL — ABNORMAL HIGH (ref 70–99)
Glucose-Capillary: 89 mg/dL (ref 70–99)

## 2021-04-18 MED ORDER — SODIUM CHLORIDE 3 % IN NEBU
4.0000 mL | INHALATION_SOLUTION | Freq: Three times a day (TID) | RESPIRATORY_TRACT | Status: AC
  Administered 2021-04-18 – 2021-04-20 (×8): 4 mL via RESPIRATORY_TRACT
  Filled 2021-04-18 (×8): qty 4

## 2021-04-18 NOTE — Plan of Care (Signed)
  Problem: Health Behavior/Discharge Planning: Goal: Ability to manage health-related needs will improve Outcome: Progressing   

## 2021-04-18 NOTE — Progress Notes (Signed)
Palliative: ? ?Reconsulted 3/17. Palliative team has had extensive goals of care discussions with this family several times - they have been consistent in requesting full scope care and long term care placement stating this would be acceptable quality of life for patient - from my review of chart it seems that goals of care are clear but disposition is an issue which is unfortunately not something we can assist with - i'm not sure [place] becoming involved again will be productive. Will discontinue consult, if other needs arise we can assist with or if family is interested in further goals of care discussions please reconsult as we are happy to reengage. ? ?Gerlean Ren, DNP, AGNP-C ?Palliative Medicine Team ?Team Phone # [phone]  ?Pager # [phone] ? ?NO CHARGE ?

## 2021-04-18 NOTE — Progress Notes (Signed)
.rdr.rdr ?      ? ? ? [facility] ?                                                                            ? ?Patient Demographics ? ?Stephen Delgado, is a 56 y.o. male, DOB - 02-04-1965, [id number] ? ?Admit date - 12/30/2020   Admitting Physician Kerney Elbe, MD ? ?Outpatient Primary MD for the patient is Patient, [name] ? ?Outpatient specialists:  ? ?LOS - 109  days  ? ?Medical records reviewed and are as summarized below: ? ? ? ?Chief Complaint  ?Patient presents with  ? Altered Mental Status  ?    ? ?Brief summary  ? ?56 y.o. male with PMH significant for DM2, HTN, CKD with questionable compliance to medications ?Patient presented to the ED on 12/30/2020 with complaint of headache, altered mental status ?  ?In the ED, he was hypertensive to 238/160, agitated, required restraints ?CT head showed large amount of intraventricular hemorrhage involving lateral/third/fourth ventricle, ICH and left basal ganglia as well as chronic microvascular ischemic changes. ?He was started on Cleviprex drip and admitted to neuro ICU ?  ?He subsequently had CT head repeated on 11/30, 12/1 and 12/3, all demonstrated unchanged intraparenchymal hemorrhage and intraventricular hemorrhage ?12/3, MRI brain from also showed numerous small acute infarcts in both cerebral hemisphere with minimal improvement in the right superior cerebellum ?12/3, intubated ?12/13, tracheostomy ?12/15, wean from vent to trach collar ?12/16, PEG tube placement ?12/16, repeat MRI showed an increase in the size and number of acute infarcts in the bilateral hemispheres with increased involvement in the cerebellum bilaterally.   ?12/18, transferred from neuro service to [facility]  ?12/21, palliative care consulted.  Family wanted to continue aggressive care as full CODE STATUS. ?12/23 developed Cheyne-Stokes respiratory pattern with respiratory alkalosis.  Transferred to ICU for short-term mechanical ventilation and was found to have new  tracheobronchitis now on cefepime ?12/25 back to progressive floor and to TRH ?  ?His hospital course has been complicated by significant neurological impairment, aspiration events, AKI, hypernatremia, urinary retention. ?1/9 trach changed to #6.0 Shiley cuffless--1/10 > 30 days since trach inserted. ?  ? ? ?Assessment & Plan  ? 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 Called spouse on 04/16/21 and left voicemail message. Called son [name] [phone] on 04/17/21 and left voicemail message. ? ?Acute otitis media, right with mastoid effusion ?Recent bilateral conjunctivitis ?-CTA maxillofacial area showed new right middle ear and mastoid opacifications consistent with otitis media and mastoid effusion.  Had intermittent low-grade fevers since 3/11 ?-Follow-up CT to evaluate for resolution. If no improvement, will require ENT evaluation or possible myringotomy tube. ? ?Small bowel obstruction ?-Resolved as of 04/14/2021 ?-Patient had developed emesis and abdominal distention on 3/9.  KUB revealed SBO, CT showed obstipation but no obstruction ?-Surgery was consulted and recommended bowel regimen. ?-Continue bowel regimen with lactulose and MiraLAX. ? ?Hypernatremia, hypokalemia ?-Sodium trended up to 148, was placed on free water and D5  ?-sodium improved to 137 ?-Replace  K as needed ? ?Acute respiratory failure w/tracheostomy dependence ?Recurrent Serratia tracheobronchitis (recent MSSA and serratia pneumonia; recent Klebsiella tracheobronchitis); ?-Continue trach care, scopolamine patch ?   ?Malignant hypertension ?-BP stable, continue Norvasc, Coreg, hydralazine  ?  ?Acute kidney injury on CKD  ?-Baseline creatinine ~1.4 ?-Creatinine stable, 0.9, continue free  water ? ?Diabetes mellitus type 2 in obese (HCC) ?-A1c 6.3 in 12/31/2020 ?-Continue SSI every 4 hours ?-CBGs fairly controlled ?  ?Transaminitis ?-Likely due to Zyprexa, resolved ? ?Protein-calorie malnutrition/dysphagia due to recent stroke/constipation ?-Continue tube feeds via PEG tube ?-Continue free water ? ? ?  ? Code Status: full  ?DVT Prophylaxis:  enoxaparin (LOVENOX) injection 40 mg Start: 03/21/21 1030 ? ? ?Level of Care: Level of care: Med-Surg ?Family Communication:  ?Disposition Plan:     Status is: Inpatient ?Remains inpatient appropriate because: TOC assisting with placement.  Patient was seen by palliative medicine during this admission, goal for long-term placement in skilled care. ? ?Procedures:  ?Echocardiogram ?EEG ?Cortrack ?PEG tube placement ?Tracheostomy tube placement ? ?Consultants:   ?Neurology ?CCM ? ? ?Antimicrobials:  ? ? ?Medications ? ?Scheduled Meds: ? amantadine  100 mg Per Tube BID  ? amLODipine  10 mg Per Tube QHS  ? baclofen  10 mg Per Tube TID AC & HS  ? carvedilol  25 mg Per Tube BID WC  ? chlorhexidine  15 mL Mouth Rinse BID  ? enoxaparin (LOVENOX) injection  40 mg Subcutaneous Daily  ? free water  200 mL Per Tube Q4H  ? hydrALAZINE  100 mg Per Tube Q8H  ? insulin aspart  0-20 Units Subcutaneous Q4H  ? lactulose  20 g Per Tube TID  ? mouth rinse  15 mL Mouth Rinse q12n4p  ? metoCLOPramide (REGLAN) injection  5 mg Intravenous Q6H  ? nutrition supplement (JUVEN)  1 packet Per Tube BID BM  ? OLANZapine  2.5 mg Per Tube QHS  ? pantoprazole sodium  40 mg Per Tube Daily  ? polyethylene glycol  17 g Per Tube BID  ? polyvinyl alcohol  2 drop Left Eye Q6H  ? sodium chloride HYPERTONIC  4 mL Nebulization TID  ? tobramycin  2 drop Both Eyes Q6H  ? ?Continuous Infusions: ? feeding supplement (GLUCERNA 1.5 CAL) 1,000 mL (04/18/21 0941)  ? ?PRN Meds:.[DISCONTINUED] acetaminophen **OR** acetaminophen (TYLENOL) oral liquid 160 mg/5 mL **OR** acetaminophen, guaiFENesin, hydrALAZINE, influenza  vac split quadrivalent PF, ondansetron (ZOFRAN) IV ? ? ? ? ? ?Subjective:  ? ?[place] patient seen in his bedside.  No interaction.  Not following commands. ? ?Objective:  ? ?Vitals:  ? 04/18/21 0605 04/18/21 0823 04/18/21 1106 04/18/21 1648  ?BP: (!) 156/81  138/70   ?Pulse:  87 81 89  ?Resp:  20 20 (!) 22  ?Temp:   98.8 ?F (37.1 ?C)   ?TempSrc:   Axillary   ?SpO2:  98% 98% 98%  ?Weight:      ?Height:      ? ? ?Intake/Output Summary (Last 24 hours) at 04/18/2021 1815 ?Last data filed at 04/18/2021 1652 ?Gross per 24 hour  ?Intake 1672 ml  ?Output 1650 ml  ?Net 22 ml  ? ? ? ?Wt Readings from Last 3 Encounters:  ?04/13/21 108 kg  ?02/06/20 89.1 kg  ?08/18/17 81.3 kg  ? ?Physical Exam: No significant change from prior exam. ?General: Frail-appearing no interaction.  Does not follow commands. ?Cardiovascular: Regular rate and rhythm no rubs gallops. ?Respiratory: No rhonchi's or wheezes noted.  Poor respiratory effort. ?  Gastrointestinal: Soft bowel sounds present. ?Ext: Trace edema bilaterally. ?Neuro: Does not follow commands. ? ? ?Data Reviewed:  I have personally reviewed following labs and imaging studies ? ? ? ?Radiology Reports ?No recent imaging data ? ?Lab Data: ? ?CBC: ?No results for input(s): WBC, NEUTROABS, HGB, HCT, MCV, PLT in the last 168 hours. ? ?Basic Metabolic Panel: ?Recent Labs  ?Lab 04/12/21 ?[id number] 04/13/21 ?0258 04/14/21 ?[id number]  ?NA 143 140 137  ?K 3.1* 3.8 4.6  ?CL 112* 107 107  ?CO2 23 23 21*  ?GLUCOSE 155* 109* 102*  ?BUN 28* 32* 36*  ?CREATININE 1.00 1.00 0.99  ?CALCIUM 9.5 9.2 9.3  ?MG 1.9  --   --   ? ?GFR: ?Estimated Creatinine Clearance: 104.6 mL/min (by C-G formula based on SCr of 0.99 mg/dL). ?Liver Function Tests: ?No results for input(s): AST, ALT, ALKPHOS, BILITOT, PROT, ALBUMIN in the last 168 hours. ? ? ?CBG: ?Recent Labs  ?Lab 04/18/21 ?0001 04/18/21 ?0351 04/18/21 ?0911 04/18/21 ?1152 04/18/21 ?1637  ?GLUCAP 122* 117* 150* 147* 89  ? ? ? ?Kayleen Memos M.D. ?[facility] ?04/18/2021, 6:15 PM ? ?Available via Epic secure chat 7am-7pm ?After 7 pm, please refer to night coverage provider listed on amion. ? ?  ?

## 2021-04-19 LAB — GLUCOSE, CAPILLARY
Glucose-Capillary: 118 mg/dL — ABNORMAL HIGH (ref 70–99)
Glucose-Capillary: 122 mg/dL — ABNORMAL HIGH (ref 70–99)
Glucose-Capillary: 125 mg/dL — ABNORMAL HIGH (ref 70–99)
Glucose-Capillary: 142 mg/dL — ABNORMAL HIGH (ref 70–99)
Glucose-Capillary: 148 mg/dL — ABNORMAL HIGH (ref 70–99)

## 2021-04-19 NOTE — Plan of Care (Signed)
  Problem: Health Behavior/Discharge Planning: Goal: Ability to manage health-related needs will improve Outcome: Progressing   

## 2021-04-19 NOTE — Progress Notes (Signed)
.rdr.rdr ?      ? ? ? [facility] ?                                                                            ? ?Patient Demographics ? ?Stephen Delgado, is a 56 y.o. male, DOB - May 19, 1965, [id number] ? ?Admit date - 12/30/2020   Admitting Physician Kerney Elbe, MD ? ?Outpatient Primary MD for the patient is Patient, [name] ? ?Outpatient specialists:  ? ?LOS - 110  days  ? ?Medical records reviewed and are as summarized below: ? ? ? ?Chief Complaint  ?Patient presents with  ? Altered Mental Status  ?    ? ?Brief summary  ? ?56 y.o. male with PMH significant for DM2, HTN, CKD with questionable compliance to medications ?Patient presented to the ED on 12/30/2020 with complaint of headache, altered mental status ?  ?In the ED, he was hypertensive to 238/160, agitated, required restraints ?CT head showed large amount of intraventricular hemorrhage involving lateral/third/fourth ventricle, ICH and left basal ganglia as well as chronic microvascular ischemic changes. ?He was started on Cleviprex drip and admitted to neuro ICU ?  ?He subsequently had CT head repeated on 11/30, 12/1 and 12/3, all demonstrated unchanged intraparenchymal hemorrhage and intraventricular hemorrhage ?12/3, MRI brain from also showed numerous small acute infarcts in both cerebral hemisphere with minimal improvement in the right superior cerebellum ?12/3, intubated ?12/13, tracheostomy ?12/15, wean from vent to trach collar ?12/16, PEG tube placement ?12/16, repeat MRI showed an increase in the size and number of acute infarcts in the bilateral hemispheres with increased involvement in the cerebellum bilaterally.   ?12/18, transferred from neuro service to [facility]  ?12/21, palliative care consulted.  Family wanted to continue aggressive care as full CODE STATUS. ?12/23 developed Cheyne-Stokes respiratory pattern with respiratory alkalosis.  Transferred to ICU for short-term mechanical ventilation and was found to have new  tracheobronchitis now on cefepime ?12/25 back to progressive floor and to TRH ?  ?His hospital course has been complicated by significant neurological impairment, aspiration events, AKI, hypernatremia, urinary retention. ?1/9 trach changed to #6.0 Shiley cuffless--1/10 > 30 days since trach inserted. ?04/19/21: Updated family via phone, spoke with the patient's son Stephen Delgado.  Stephen Delgado states family is aware that the hospital is not a long-term facility and they are working on obtaining Medicaid so they can move the patient to SNF from the hospital.  Stephen Delgado is receptive to talking with palliative care medicine, he has Stephen Delgado's number and will call if needed. ?  ? ? ?Assessment & Plan  ? 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  Spoke with son, stated above. ? ?Right with mastoid effusion ?Recent bilateral conjunctivitis ?-CTA maxillofacial area showed new right middle ear and mastoid opacifications consistent with otitis media and mastoid effusion.  Had intermittent low-grade fevers since 3/11.  Repeated imaging. ? ?Resolved small bowel obstruction ?-Resolved as of 04/14/2021 ?-Patient had developed emesis and abdominal distention on 3/9.  KUB revealed SBO, CT showed obstipation but no obstruction ?-  Surgery was consulted and recommended bowel regimen. ?-Continue bowel regimen with lactulose and MiraLAX. ? ?Resolved hypernatremia, hypokalemia ? ?Acute respiratory failure w/tracheostomy dependence ?Recurrent Serratia tracheobronchitis (recent MSSA and serratia pneumonia; recent Klebsiella tracheobronchitis); ?-Continue trach care, scopolamine patch ?   ?Resolved malignant hypertension ?-BP stable, continue Norvasc, Coreg, hydralazine  ?  ?Resolved acute kidney  injury ?Back to baseline renal function ? ?Prediabetes in obese ([place]) ?-A1c 6.3 in 12/31/2020 ?-Continue SSI every 4 hours ?-CBGs fairly controlled ?  ?Improved transaminitis ?-Likely due to Zyprexa ? ?Protein-calorie malnutrition/dysphagia due to recent stroke/constipation ?Continue tube feeds via PEG tube ?-Continue free water ? ? ?  ? Code Status: full  ?DVT Prophylaxis:  enoxaparin (LOVENOX) injection 40 mg Start: 03/21/21 1030 ? ? ?Level of Care: Level of care: Med-Surg ?Family Communication: Updated her son, Stephen Delgado, via phone.   ?Disposition Plan:     Status is: Inpatient ?Remains inpatient appropriate because: TOC assisting with placement.  Patient was seen by palliative medicine during this admission, goal for long-term placement in skilled care. ? ?Procedures:  ?Echocardiogram ?EEG ?Cortrack ?PEG tube placement ?Tracheostomy tube placement ? ?Consultants:   ?Neurology ?CCM ? ? ?Antimicrobials:  ? ? ?Medications ? ?Scheduled Meds: ? amantadine  100 mg Per Tube BID  ? amLODipine  10 mg Per Tube QHS  ? baclofen  10 mg Per Tube TID AC & HS  ? carvedilol  25 mg Per Tube BID WC  ? chlorhexidine  15 mL Mouth Rinse BID  ? enoxaparin (LOVENOX) injection  40 mg Subcutaneous Daily  ? free water  200 mL Per Tube Q4H  ? hydrALAZINE  100 mg Per Tube Q8H  ? insulin aspart  0-20 Units Subcutaneous Q4H  ? lactulose  20 g Per Tube TID  ? mouth rinse  15 mL Mouth Rinse q12n4p  ? metoCLOPramide (REGLAN) injection  5 mg Intravenous Q6H  ? nutrition supplement (JUVEN)  1 packet Per Tube BID BM  ? OLANZapine  2.5 mg Per Tube QHS  ? pantoprazole sodium  40 mg Per Tube Daily  ? polyethylene glycol  17 g Per Tube BID  ? polyvinyl alcohol  2 drop Left Eye Q6H  ? sodium chloride HYPERTONIC  4 mL Nebulization TID  ? tobramycin  2 drop Both Eyes Q6H  ? ?Continuous Infusions: ? feeding supplement (GLUCERNA 1.5 CAL) 1,000 mL (04/19/21 [id number])  ? ?PRN Meds:.[DISCONTINUED] acetaminophen **OR** acetaminophen (TYLENOL) oral liquid 160 mg/5 mL  **OR** acetaminophen, guaiFENesin, hydrALAZINE, influenza vac split quadrivalent PF, ondansetron (ZOFRAN) IV ? ? ? ? ? ?Subjective:  ? ?Stephen Delgado patient was seen at bedside.  Unresponsive. ? ?Objective:  ? ?Vitals:  ? 04/19/21 0749 04/19/21 0810 04/19/21 0943 04/19/21 1127  ?BP:   (!) 169/82   ?Pulse: 87 89 89 81  ?Resp: 20 20 20 19   ?Temp:   98.7 ?F (37.1 ?C)   ?TempSrc:   Oral   ?SpO2: 96% 100% 98% 99%  ?Weight:      ?Height:      ? ? ?Intake/Output Summary (Last 24 hours) at 04/19/2021 1438 ?Last data filed at 04/19/2021 1400 ?Gross per 24 hour  ?Intake 3320 ml  ?Output 2000 ml  ?Net 1320 ml  ? ? ? ?Wt Readings from Last 3 Encounters:  ?04/13/21 108 kg  ?02/06/20 89.1 kg  ?08/18/17 81.3 kg  ? ?Physical Exam:  ?General: Frail-appearing in no acute distress.  Unresponsive.  Does not follow commands ?Cardiovascular: Regular rate and rhythm no rubs or gallops. ?Respiratory: Mild  rales at bases no wheezing noted.  Poor inspiratory effort ?Gastrointestinal: Bowel sounds present.  Tube in place. ?Ext: Trace edema in lower extremities bilaterally.  Dependent edema in upper extremities bilaterally. ?Neuro: Does not follow commands. ? ? ?Data Reviewed:  I have personally reviewed following labs and imaging studies ? ? ? ?Radiology Reports ?No recent imaging data ? ?Lab Data: ? ?CBC: ?No results for input(s): WBC, NEUTROABS, HGB, HCT, MCV, PLT in the last 168 hours. ? ?Basic Metabolic Panel: ?Recent Labs  ?Lab 04/13/21 ?0258 04/14/21 ?[id number]  ?NA 140 137  ?K 3.8 4.6  ?CL 107 107  ?CO2 23 21*  ?GLUCOSE 109* 102*  ?BUN 32* 36*  ?CREATININE 1.00 0.99  ?CALCIUM 9.2 9.3  ? ?GFR: ?Estimated Creatinine Clearance: 104.6 mL/min (by C-G formula based on SCr of 0.99 mg/dL). ?Liver Function Tests: ?No results for input(s): AST, ALT, ALKPHOS, BILITOT, PROT, ALBUMIN in the last 168 hours. ? ? ?CBG: ?Recent Labs  ?Lab 04/18/21 ?2016 04/18/21 ?[id number] 04/19/21 ?0401 04/19/21 ?0831 04/19/21 ?1316  ?GLUCAP 123* 110* 142* 148* 118*   ? ? ? ?Kayleen Memos M.D. ?[facility] ?04/19/2021, 2:38 PM ? ?Available via Epic secure chat 7am-7pm ?After 7 pm, please refer to night coverage provider listed on amion. ? ?  ?

## 2021-04-20 LAB — GLUCOSE, CAPILLARY
Glucose-Capillary: 151 mg/dL — ABNORMAL HIGH (ref 70–99)
Glucose-Capillary: 134 mg/dL — ABNORMAL HIGH (ref 70–99)
Glucose-Capillary: 122 mg/dL — ABNORMAL HIGH (ref 70–99)
Glucose-Capillary: 142 mg/dL — ABNORMAL HIGH (ref 70–99)
Glucose-Capillary: 134 mg/dL — ABNORMAL HIGH (ref 70–99)
Glucose-Capillary: 151 mg/dL — ABNORMAL HIGH (ref 70–99)
Glucose-Capillary: 114 mg/dL — ABNORMAL HIGH (ref 70–99)
Glucose-Capillary: 130 mg/dL — ABNORMAL HIGH (ref 70–99)

## 2021-04-20 NOTE — Progress Notes (Signed)
?  ?  Durable Medical Equipment  ?(From admission, onward)  ?  ? ? ?  ? ?  Start     Ordered  ? 04/20/21 1248  For home use only DME Trach supplies  (For Home Use Only DME [employer])  Once       ?Question Answer Comment  ?Trach Type Shiley   ?Back Up Trach Type Shiley   ?Cuffed or Uncuffed Uncuffed   ?Fenestrated No   ?Size 6 mm   ?XLT Length No   ?Trach Supplies/Equipment for Home Use Tube Holder/Collar   ?Trach Supplies/Equipment for Home Use Tracheostomy Drain Dressings   ?Trach Supplies/Equipment for Home Use Medical Suction Machine   ?Trach Supplies/Equipment for Home Use Suction Catheters   ?Trach Supplies/Equipment for Home Use Tracheostomy Care Cleaning Kits   ?Trach Supplies/Equipment for Home Use Humidification (oxygen 5 liters via trach collar to provide specified % - if tracheostomy is capped with occlusive cap during day, a separate  order will need to be provided)   ?Suction Catheter Size 14 French (for size 6 or higher)   ?Cleaning Kits 60   ?Oxygen % Other (see comment) 5L/min humidified  ?  ? 04/20/21 1250  ? 04/20/21 1247  For home use only DME Tube feeding pump  Once       ?Question:  Length of Need  Answer:  Lifetime  ? 04/20/21 1246  ? 04/20/21 1246  For home use only DME Tube feeding  Once       ? 04/20/21 1246  ? 04/20/21 1240  For home use only DME Hospital bed  Once       ?Question Answer Comment  ?Length of Need Lifetime   ?Patient has (list medical condition): S/P intraventricular hemmorhage, Acute respiratory failure - trach, Malnutrition/ Dysphagia - PEG   ?The above medical condition requires: Patient requires the ability to reposition frequently   ?Head must be elevated greater than: 30 degrees   ?Bed type Semi-electric   ?Support Surface: Gel Overlay   ?  ? 04/20/21 1245  ? ?  ?  ? ?  ?  ?

## 2021-04-20 NOTE — Progress Notes (Addendum)
? ?Progress Note ? ? ?Patient: Stephen Delgado [id number] DOB: 1965/02/10 DOA: 12/30/2020     111 ?DOS: the patient was seen and examined on 04/20/2021 ? ? ? ?  ?Brief hospital course: ?56 y.o. male with PMH significant for DM2, HTN, CKD with questionable compliance to medications ?Patient presented to the ED on 12/30/2020 with complaint of headache, altered mental status ?  ?In the ED, he was hypertensive to 238/160, agitated, required restraints ?CT head showed large amount of intraventricular hemorrhage involving lateral/third/fourth ventricle, ICH and left basal ganglia as well as chronic microvascular ischemic changes. ?He was started on Cleviprex drip and admitted to neuro ICU ?  ?He subsequently had CT head repeated on 11/30, 12/1 and 12/3, all demonstrated unchanged intraparenchymal hemorrhage and intraventricular hemorrhage ?12/3, MRI brain from also showed numerous small acute infarcts in both cerebral hemisphere with minimal improvement in the right superior cerebellum ?12/3, intubated ?12/13, tracheostomy ?12/15, wean from vent to trach collar ?12/16, PEG tube placement ?12/16, repeat MRI showed an increase in the size and number of acute infarcts in the bilateral hemispheres with increased involvement in the cerebellum bilaterally.   ?12/18, transferred from neuro service to [facility]  ?12/21, palliative care consulted.  Family wanted to continue aggressive care as full CODE STATUS. ?12/23 developed Cheyne-Stokes respiratory pattern with respiratory alkalosis.  Transferred to ICU for short-term mechanical ventilation and was found to have new tracheobronchitis now on cefepime ?12/25 back to progressive floor and to TRH ?  ?His hospital course has been complicated by significant neurological impairment, aspiration events, AKI, hypernatremia, urinary retention. ?1/9 trach changed to #6.0 Shiley cuffless--1/10 > 30 days since trach inserted. ? 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  As a precaution we will obtain EEG ? ?Palliative: ?  ?Reconsulted 3/17. Palliative team has had extensive goals of care discussions with this family several times - they have been consistent in requesting full scope care and long term care placement stating this would be acceptable quality of life for patient - from my review of chart it seems that goals of care are clear but disposition is an issue which is unfortunately not something we can assist with - i'm not sure [place] becoming involved again will be productive. Will discontinue consult, if other needs arise we can assist with or if family is interested in further goals of care discussions please reconsult as we are happy to reengage. ?-Eval per Ethics MD on 2/24: ? ?ETHICS CONSULT - BRIEF ?  ?Spoke yesterday with Erin Hearing NP - reviewed ethical principles of medical futility as applies to this patient?s case. Per her report, this has been a difficult placement issue and insurance payor has brought up stopping treatment, question of futility. Per Ms Lissa Merlin, patient is stable and his QOL is acceptable per his appropriate legal decision maker, who seems to be acting with best of intent and in ac cordance with what the patient would want for himself. This does not appear to be a case of medical futility. ONLY the attending physician in cooperation with other appropriate members of the treatment team may determine medical futility - insurance NEVER has this ability.  ? ? ? ?A physical therapy consult is  indicated based on the patient?s mobility assessment. ? ? ?Mobility Assessment (last 72 hours)   ? ?  Mobility Assessment   ? ? [place] Name 03/29/21 2000 03/29/21 1800 03/29/21 1400 03/29/21 1200 03/29/21 0800  ? Does patient have an order for bedrest or is patient medically unstable Yes- Bedfast (Level 1) - Complete Yes- Bedfast (Level 1) - Complete Yes- Bedfast (Level 1) - Complete Yes- Bedfast (Level 1) - Complete Yes- Bedfast (Level 1) - Complete  ? What is the highest level of mobility based on the progressive mobility assessment? Level 1 (Bedfast) - Unable to balance while sitting on edge of bed -- Level 1 (Bedfast) - Unable to balance while sitting on edge of bed Level 1 (Bedfast) - Unable to balance while sitting on edge of bed Level 1 (Bedfast) - Unable to balance while sitting on edge of bed  ? Is the above level different from baseline mobility prior to current illness? No - Consider discontinuing PT/OT -- Yes - Recommend PT order Yes - Recommend PT order Yes - Recommend PT order  ? ? [place] Name 03/28/21 2000 03/28/21 0800  ?  ?  ?  ? Does patient have an order for bedrest or is patient medically unstable Yes- Bedfast (Level 1) - Complete Yes- Bedfast (Level 1) - Complete     ? What is the highest level of mobility based on the progressive mobility assessment? Level 1 (Bedfast) - Unable to balance while sitting on edge of bed Level 1 (Bedfast) - Unable to balance while sitting on edge of bed     ? Is the above level different from baseline mobility prior to current illness? No - Consider discontinuing PT/OT No - Consider discontinuing PT/OT     ? ?  ?  ? ?  ? ? ? ?Acute otitis media, right with mastoid effusion ?Patient with recent bilateral conjunctivitis with some persistent injection and purulent drainage ?Patient's inability to report any symptoms a CTA of the maxillofacial area was completed that reveals new right middle ear and mastoid opacification consistent with otitis media with mastoid effusion ?Patient has demonstrated intermittent low-grade fevers since the 11th. ?In review of the literature from  up-to-date typically this requires no treatment unless patient is experiencing fullness in the ear etc.  Patient is unable to describe any symptoms and given his inability to participate would not be a candidate for intranasal meds such as Afrin. ?Will eventually need follow-up CT to evaluate for resolution.  If not will require myringotomy tube evaluation from ENT ?Right TM unremarkable and without signs of infection ? ? ?SBO ruled out/constipation-resolved as of 04/14/2021 ?Developed emesis and abdominal distention on the afternoon of 3/9 ?Symptoms worsen.  KUB revealed SBO but CT revealed obstipation ?Symptoms resolved with resumption of laxatives ? ? ? ? 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 ? ?Malignant hypertension ?Continue Norvasc Coreg, hydralazine, and clonidine ?Current BP readings in the controlled range ? ?Diabetes mellitus type 2 in obese (HCC) ?-A1c 6.3 in 12/31/2020 ?-Continue SSI every 4 hours ?-CBGs well controlled ? ? ? ?  ? ?Acute hypernatremia secondary to laxative induced diarrhea ?Resolved ?Laxatives resumed ? ? ?Acute conjunctivitis, bilateral ?On exam today noted with bilateral scleral injection and yellow drainage accumulating on lashes and inner canthus ?Continue tobramycin ophthalmic to both eyes every 6 hours ?Chane and purulent drainage has resolved from right eye but continues  in left eye.  This is concerning for possible underlying sinus infection so I have ordered a maxillofacial CT. states that patient has had issues with recurrent sinus problems for many years ? ?Pneumonia due to Klebsiella pneumoniae (HCC)-resolved as of 04/01/2021 ?Resolved ? ?Protein-calorie malnutrition/dysphagia due to recent stroke/constipation ?Nutrition Problem: Inadequate oral intake ?Etiology: lethargy/confusion,  dysphagia ?Signs/Symptoms: NPO status ?Interventions: Tube feeding via PEG ?Body mass index is 23.33 kg/m?Marland Kitchen  ?Continue Glucerna ? ?  ? ?Acute urinary retention-resolved as of 03/30/2021 ?-Continue Urecholine ?-Foley c

## 2021-04-20 NOTE — Progress Notes (Signed)
Nutrition Follow-up ? ?DOCUMENTATION CODES:  ?Severe malnutrition in context of chronic illness ? ?INTERVENTION:  ?Obtain weights weekly  ? ?Continue TF via PEG: ?-Glucerna 1.5 @ goal rate of 43m/hr (14471mday)  ?-Free water per MD/NP, currently 20065mree water Q4H ? ?At goal, TF regimen will provide 2160 kcals, 118 grams protein, 1092m9mee water (2292ml33mal free water with flushes) ? ?Recommend continue 1 packet Juven BID, each packet provides 95 calories, 2.5 grams of protein (collagen), and 9.8 grams of carbohydrate (3 grams sugar); also contains 7 grams of L-arginine and L-glutamine, 300 mg vitamin C, 15 mg vitamin E, 1.2 mcg vitamin B-12, 9.5 mg zinc, 200 mg calcium, and 1.5 g  Calcium Beta-hydroxy-Beta-methylbutyrate to support wound healing ? ?NUTRITION DIAGNOSIS:  ?Severe Malnutrition related to chronic illness as evidenced by severe muscle depletion, severe fat depletion. -- ongoing ? ?GOAL:  ?Patient will meet greater than or equal to 90% of their needs -- addressing with TF, met when TF at goal ? ?MONITOR:  ?TF tolerance, Labs, I & O's, Skin ? ?REASON FOR ASSESSMENT:  ?Ventilator, Consult ?Enteral/tube feeding initiation and management ? ?ASSESSMENT:  ?55 ye96 old male who presented to the ED on 11/29 with AMS. PMH of HTN, T2DM, CKD stage III. 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 ? ?11/30 cortrak placed; tip gastric ?12/2 trickle TF ?12/3 pt intubated due to increased WOB, +emesis, TF held ?12/4 trickle TF restarted ?12/5 advancement orders placed ?12/9 TF adjusted ?12/13 s/p tracheostomy ?12/16 s/p EGD and PEG placement ?12/19 tx out of ICU to [facility]  [facility]/23 developed Cheyne-Stokes respiratory pattern with respiratory alkalosis.Transferred to ICU for short-term mechanical ventilation and was found to have new tracheobronchitis now on cefepime ?12/25 tx back to TRH ?[place]9 trach changed to #6 shiley cuffless  ?1/10 marks 30 days since trach  placed ?2/17 trach changed   ?3/03 trach changed  ?3/04 SBO resolved ? ?Pt continues to be minimally responsive, tolerating ATC and continues to be NPO requiring TF via PEG. Family is working to obtain Medicaid so pt can be transitioned to SNF.  ? ?Current TF: Glucerna 1.5 @ 60ml/25m/ 200ml f41mwater Q4H ?  ?UOP: 1800ml x220murs ?I/O: +6970ml sin17mdmit ? ?Admit wt 85.2 kg ?Current wt 108 kg (unsure if weight is accurate at this time, will monitor for trends) ?Non-pitting edema noted to BUE and BLE ?  ?Medications: chronulac, reglan, protonix, SSI Q4H, juven BID, miralax ?Labs reviewed ?CBGs: 118-151 x 24 hours ? ?Diet Order:   ?Diet Order   ? ?       ?  Diet NPO time specified  Diet effective midnight       ?  ? ?  ?  ? ?  ? ?EDUCATION NEEDS:  ?Not appropriate for education at this time ? ?Skin:  Skin Assessment: Reviewed RN Assessment ?Skin Integrity Issues:: Stage II ?Stage II: anus ? ?Last BM:  3/19 type 7 ? ?Height:  ?Ht Readings from Last 1 Encounters:  ?01/23/21 6' (1.829 m)  ? ?Weight:  ?Wt Readings from Last 1 Encounters:  ?04/13/21 108 kg  ? ?BMI:  Body mass index is 32.28 kg/m?. ? ?Estimated Nutritional Needs:  ?Kcal:  2150-2350 ?Protein:  105-120 grams ?Fluid:  >2L ? ? ?Stephen Delgado A.[name], LDN (she/her/hers) ?RD pager number and weekend/on-call pager number located in Amion. ? [place]

## 2021-04-20 NOTE — Progress Notes (Signed)
? ?  Palliative Medicine Inpatient Follow Up Note ? ? ?Palliative care has received a consult from Stephen Delgado  toe discuss "Options for home care with hospice after discharge". ? ?The Palliative Care Team know's the Stephen Delgado family well. In the past they have been very transparent with their desire for full scope of care. ? ?[name]'s situation is complicated as he is no longer deemed inpatient appropriate. The Transitions of Care team have been working with family to coordinate discharge planning as [name] will need to transition to his home from here. ? ?Although the needs are primarily disposition, the Palliative Care team is happy to attend additional meetings to better support family given this difficult situation. ? ?TOC will alert the PMT when a meeting date and time are arranged.  ? ?No Charge ?______________________________________________________________________________________ ?Stephen Delgado ?[place] Palliative Medicine Team ?Team Cell Phone: [phone] ?Please utilize secure chat with additional questions, if there is no response within 30 minutes please call the above phone number ? ?Palliative Medicine Team providers are available by phone from 7am to 7pm daily and can be reached through the team cell phone.  ?Should this patient require assistance outside of these hours, please call the patient's attending physician. ? ? ? ? ?

## 2021-04-20 NOTE — Progress Notes (Signed)
? ?NAME:  TERRIANCE PAVESE, MRN:  [id number], DOB:  1965-06-03, LOS: 111 ?ADMISSION DATE:  12/30/2020, CONSULTATION DATE:  12/19 ?REFERRING MD:  Erlinda Hong, CHIEF COMPLAINT:  Dyspnea  ? ?History of Present Illness:  ?56 yo male presented with headache, nausea and altered mental status.  CT head showed acute Lt ICH with IVH likely related to hypertensive emergency (BP 195/116). Had persistent hypertension and concern for airway protection, and PCCM asked to assist with ICU management.  Tracheostomy 12/13.  Moved out of ICU.  ? ?Pertinent  Medical History  ?HTN, DM type 2, CKD 3a, OSA ? ?Significant Hospital Events: ?Including procedures, antibiotic start and stop dates in addition to other pertinent events   ?11/29 Admit, start cleviprex ?12/01 PCCM consulted; changed from cleviprex to cardene due to elevated triglycerides ?12/03 intubated; episode of vomiting >> tube feeds held ?12/04 resume trickle tube feeds ?12/06 remains minimally responsive. Tolerating SBT.  ?12/12 no acute events overnight, T-max 102 point ?12/13 bedside trach planned midmorning  ?12/14 ATC. Lasix --> 5L UOP  ?12/15 cont on trach collar. Na to 154 from 145. Adding low rate d5  ?12/16 scheduled for PEG. Cont D5. Continues on trach collar ?Out of ICU 12/19 ?12/23 Cheyne-Stokes respirations noted and was transferred to ICU for short-term ventilation.  ?12/25 Returned to floor ?1/9 Trach changed to 6 cuffless  ?2/6 Trach follow up, no major changes, remains on ATC  ?2/27 trach f/u, on TC, seems to attend briefly, otherwise poor neuro exam ?04/06/2021 Trach FU, On ATC at 28%, no change in MS, neuro exam ? ?Imaging/Studies: ?CT head 11/29 >> large amount of IVH involving lateral/3rd/4th ventricles, ICH in Lt BG, chronic microvascular ischemic changes ?Echo 11/30 >> EF 50 to 55%, mod LVH, grade 1 DD, ascending aorta 37 mm ?EEG 12/01 >> continuous generalized slowing ?MRI brain 12/03 >> numerous small acute infarcts in b/l cerebral hemispheres, Lt caudate  hemorrhage, IVH, scattered sulcal SAH, mild communicating hydrocephalus ?CT head 12/23 Expected evolution of strokes, no shift or herniation ? ? ? ?Interim History / Subjective:  ? ?No changes over the past week in his trach care. Secretions are at a minimum.  ? ? ?Objective   ?Blood pressure (!) 146/79, pulse 82, temperature 99.3 ?F (37.4 ?C), temperature source Axillary, resp. rate 20, height 6' (1.829 m), weight 108 kg, SpO2 100 %. ?   ?FiO2 (%):  [21 %] 21 %  ? ?Intake/Output Summary (Last 24 hours) at 04/20/2021 1321 ?Last data filed at 04/20/2021 1254 ?Gross per 24 hour  ?Intake 1096 ml  ?Output 2150 ml  ?Net -1054 ml  ? ?Filed Weights  ? 03/15/21 0509 03/16/21 0500 04/13/21 1700  ?Weight: 83.9 kg 83.5 kg 108 kg  ? ?Physical exam  ?General male , on ATC, in NAD ?HENT 6 cuffless trach secure and intact ?Pulm clear to auscultation, no wheezing ?Card rrr, S1, S2,, No RMG ?Abd soft, NT, ND, BS + ?Ext warm, no edema ?Neuro:  eyes open. Right ward gaze, does not follow commands ? ?Resolved Hospital Problem list   ? ? ?Assessment & Plan:  ? 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  Cont routine trach bundle ?PRN nebs for secretions ?We will continue to follow weekly  ? ? ?Freda Jackson, MD ?[facility] ?Office: [phone] ? ? ?See Amion for personal pager ?PCCM on call pager [phone] until 7pm. ?Please call Elink 7p-7a. [phone] ? ? ? ? ? ? ? ? ?

## 2021-04-20 NOTE — Progress Notes (Signed)
RT NOTE: Patient due for scheduled trach change. Per materials there are no #6 Shiley cuffless trachs available at this time; trachs are scheduled to be delivered 04/24/2021. RT will change patient's trach when trachs are delivered.  ?

## 2021-04-20 NOTE — TOC Progression Note (Addendum)
Transition of Care (TOC) - Progression Note  ? ? ?Patient Details  ?Name: Stephen Delgado ?MRN: [id number] ?Date of Birth: Aug 22, 1965 ? ?Transition of Care (TOC) CM/SW Contact  ?Stephen Bridgeman, RN ?Phone Number: ?04/20/2021, 12:18 PM ? ?Clinical Narrative:    ?CM called and left a message with the patient's wife and son to discuss patient's need to discharge to the home.  The patient is unable to qualify for Medicaid to cover the costs of LTC placement due to the patient's assets in the home.  The patient's family has expressed inability to pay privately for LTC placement as well.  I spoke with the patient's son, Stephen Delgado, on the phone and he was made aware and asked to speak with the patient's wife concerning the above.  I left voicemail message with the patient's son, Stephen Delgado and wife, Stephen Delgado on the phone to discuss plans to discharge to home.  [name] will need to complete teaching with bedside nursing and respiratory therapy prior to discharge to home. ? ?I called and spoke with [facility], RNCM with [facility] [phone] regarding patient's inability to remain admitted to the hospital as an inpatient waiting LTC placement that the patient's family is unable to afford at this time.  [place], RNCM asked that I reach out to [employer] to provide home health and DME resources for the patient/family - [phone] - Stephen Delgado, [employer] representative for [employer] contact. ? ?CM and MSW with DTP Team will continue to follow the patient for discharge planning to home. ? ?04/20/2021 1320 - CM called and spoke with [employer] [phone], option 7, [phone] with [employer] to coordinate needed dme in the home to be provided through [employer] - including hospital bed, tube feeding supplies, tube feeding pump, trach supplies, suction equipment and oxygen supplies.  DME orders faxed to [employer] at [phone]. ? ?04/20/2021 1436 - CM called and spoke with Marden Noble, RNCM with [facility] and  she sent in-network provider list with [facility] to secure home health resources for the patient - prior to plan to discharge home with the family - after family teaching has been provided at the bedside - pending discharge date. ? ?CM called and spoke with Stephen Delgado, CM with [facility] for possible home health services for RN, MSW, aide. ? ?CM also spoke with Stephen Delgado, palliative care to support in a family care meeting with the patient's wife and son if agreeable to discuss discharge plan to home at a pending later date if the patient is agreeable since SNF/LTC placement is not a reliable option at this time.  Hospital leadership is aware of continued communication with the family to support plan to encourage the family to take the patient home with home health services and palliative care follow up in the home through [facility]. ? ? ?Expected Discharge Plan: Home w Home Health Services ?Barriers to Discharge: Continued Medical Work up, No SNF bed ? ?Expected Discharge Plan and Services ?Expected Discharge Plan: Home w Home Health Services ?In-house Referral: Clinical Social Work, Hospice / Palliative Care, PCP / Health Connect ?Discharge Planning Services: CM Consult ?Post Acute Care Choice: Home Health ?Living arrangements for the past 2 months: Single Family Home ?                ?DME Arranged: Hospital bed, Trach supplies, Tube feeding, Oxygen ?DME Agency: [employer] ?Date DME Agency Contacted: 03/18/21 ?Time DME Agency Contacted: 1000 ?Representative spoke with at DME Agency: Jenness [place], CM with [employer] ?  ?  ?  ?  ?  ? ? ?  Social Determinants of Health (SDOH) Interventions ?  ? ?Readmission Risk Interventions ?Readmission Risk Prevention Plan 02/20/2021  ?Transportation Screening Complete  ?Medication Review Oceanographer) Complete  ?PCP or Specialist appointment within 3-5 days of discharge Not Complete  ?PCP/Specialist Appt Not Complete comments Patient will need LTC placement at SNF facility.  ?HRI or Home  Care Consult Complete  ?SW Recovery Care/Counseling Consult Complete  ?Palliative Care Screening Complete  ?Skilled Nursing Facility Complete  ?Some recent data might be hidden  ? ? ?

## 2021-04-21 LAB — BASIC METABOLIC PANEL
Anion gap: 10 (ref 5–15)
BUN: 38 mg/dL — ABNORMAL HIGH (ref 6–20)
CO2: 24 mmol/L (ref 22–32)
Calcium: 9.5 mg/dL (ref 8.9–10.3)
Chloride: 109 mmol/L (ref 98–111)
Creatinine, Ser: 0.94 mg/dL (ref 0.61–1.24)
GFR, Estimated: >60 mL/min (ref >60)
Glucose, Bld: 119 mg/dL — ABNORMAL HIGH (ref 70–99)
Potassium: 3.8 mmol/L (ref 3.5–5.1)
Sodium: 143 mmol/L (ref 135–145)

## 2021-04-21 LAB — GLUCOSE, CAPILLARY
Glucose-Capillary: 112 mg/dL — ABNORMAL HIGH (ref 70–99)
Glucose-Capillary: 149 mg/dL — ABNORMAL HIGH (ref 70–99)
Glucose-Capillary: 147 mg/dL — ABNORMAL HIGH (ref 70–99)
Glucose-Capillary: 146 mg/dL — ABNORMAL HIGH (ref 70–99)
Glucose-Capillary: 138 mg/dL — ABNORMAL HIGH (ref 70–99)
Glucose-Capillary: 144 mg/dL — ABNORMAL HIGH (ref 70–99)

## 2021-04-21 MED ORDER — LIP MEDEX EX OINT
TOPICAL_OINTMENT | CUTANEOUS | Status: DC | PRN
  Administered 2021-04-26: 75 via TOPICAL
  Filled 2021-04-21 (×2): qty 7

## 2021-04-21 NOTE — TOC Progression Note (Addendum)
Transition of Care (TOC) - Progression Note  ? ? ?Patient Details  ?Name: Stephen Delgado ?MRN: [id number] ?Date of Birth: 01/14/1966 ? ?Transition of Care (TOC) CM/SW Contact  ?[name], RN ?Phone Number: ?04/21/2021, 8:11 AM ? ?Clinical Narrative:    ?CM called and spoke with the patient's son, [name] and he was agreeable to meet this week to discuss plan of care for patient with [facility] Team and Palliative Care.  The patient's family will call back for an available time this week to discuss the possibility of discharging the patient home with family.  I will update the medical team and palliative care when the family is available to meet and discuss possibility of discharging to home once the patient's wife can coordinate likely FMLA from her job at the hospital.  ? ?CM spoke with Grandville Silos, [facility] this morning to communicate with [facility] to provide support nursing care in the home since [place] coverage may be limited for home health services. I called and left a message with [facility] and faxed clinicals to the office to review for home health request. ? ?04/21/2021 0939- [facility] has no RN availability for services in the area and was unable to provide services at the home.  I called [facility] and they are unable to provide home health  in the area due to staffing and "high deductibles through Insurance provider".  I called and left a message with Frances Furbish for review of clinicals. ? ?04/21/2021 1006 - CM called and spoke with Janey Greaser, CM with [facility] and asked for review of clinicals and insurance provider to check availability for home health benefits and availability.  Facesheet, recent progress notes, nutrition note and MAR were sent to [place], RNCM with [employer] to review at [email or web address]. ? ?1048 - CM called and spoke with Jeanmarie Plant, CM with [employer] to see if the company is able to provide  assistance for home health or private duty nursing as advised by Grandville Silos, [facility] Supervisor.  Clinicals and facesheet were faxed to [email or web address] for review to assist family with care in the home if the family are agreeable to discharge to home - pending discuss with the patient's family continues.  CM will continue to explore options for home health to best support the family in a safe discharge plan. ? ?TOC Team will continue to follow the patient for discharge needs. ? ? ?Expected Discharge Plan: Home w Home Health Services ?Barriers to Discharge: Continued Medical Work up, No SNF bed ? ?Expected Discharge Plan and Services ?Expected Discharge Plan: Home w Home Health Services ?In-house Referral: Clinical Social Work, Hospice / Palliative Care, PCP / Health Connect ?Discharge Planning Services: CM Consult ?Post Acute Care Choice: Home Health ?Living arrangements for the past 2 months: Single Family Home ?                ?DME Arranged: Hospital bed, Trach supplies, Tube feeding, Oxygen ?DME Agency: [employer] ?Date DME Agency Contacted: 03/18/21 ?Time DME Agency Contacted: 1000 ?Representative spoke with at DME Agency: Jenness Corner, CM with [employer] ?  ?  ?  ?  ?  ? ? ?Social Determinants of Health (SDOH) Interventions ?  ? ?Readmission Risk Interventions ?Readmission Risk Prevention Plan 02/20/2021  ?Transportation Screening Complete  ?Medication Review Oceanographer) Complete  ?PCP or Specialist appointment within 3-5 days of discharge Not Complete  ?PCP/Specialist Appt Not Complete comments Patient will need  LTC placement at SNF facility.  ?[place] or Home Care Consult Complete  ?SW Recovery Care/Counseling Consult Complete  ?Palliative Care Screening Complete  ?Skilled Nursing Facility Complete  ?Some recent data might be hidden  ? ? ?

## 2021-04-21 NOTE — Progress Notes (Signed)
? ?  Palliative Medicine Inpatient Follow Up Note ? ? ?Palliative care was able to secure chat with Hospitalist, Dr. [place].  ? ?At this time there are no active Palliative needs.  ? ?This is a complex discharge requiring comprehensive planning on the part of TOC. ? ?We will sign of but are happy to be re-consulted if need be.  ?______________________________________________________________________________________ ?Stephen Delgado ?[place] Team ?Team Cell Phone: [phone] ?Please utilize secure chat with additional questions, if there is no response within 30 minutes please call the above phone number ? ?Palliative Medicine Team providers are available by phone from 7am to 7pm daily and can be reached through the team cell phone.  ?Should this patient require assistance outside of these hours, please call the patient's attending physician. ? ? ? ? ?

## 2021-04-21 NOTE — TOC Progression Note (Signed)
Transition of Care (TOC) - Progression Note  ? ? ?Patient Details  ?Name: Stephen Delgado ?MRN: [id number] ?Date of Birth: [birth year] ? ?Transition of Care (TOC) CM/SW Contact  ?Curlene Labrum, RN ?Phone Number: ?04/21/2021, 1:38 PM ? ?Clinical Narrative:    ?CM spoke with the patient's wife regarding plans for discharge to home with family.  I explained to the wife in person that the insurance provider is no longer covering costs for the patient's inpatient stay in the hospital and will not cover costs for SNF placement.  I explained to her that the patient is in a chronic vegetative state and, unfortunately, is not likely to recover from his recent stroke and has made little to no improvements in his health during his hospitalization.  The wife states that she is aware that she needs to sell some property - owned cars at the home and understands that Medicaid and disability are a process and will likely not be approved in a timely manner - meaning up to a year or more to be approved.   ? ?I explained to the patient's wife that I would like to meet with the wife and sons on Thursday to discuss discharging to home in the family's care in the next week once she is able to come and learn trach care and PEG care along with nursing care of the patient.  I currently have [facility] Team and [employer] reviewing the patient's clinicals for possible nursing care in the home and the wife is aware that this nursing care availability is only a possibility at this time and that [facility] may not have availability to cover or support home health.  I asked that the wife meet with nursing staff during the week or weekend to learn how to care for her husband/patient in the home.  I messaged bedside nursing and ask that they reach out to the family to arrange and that I will continue to support this communication. ? ?DME will be provided through [employer] once the family has coordinated time with bedside nursing for teaching  and patient is closer to discharging to home. ? ?CM and MSW with DTP Team will continue to follow the patient for discharge to home - pending later date. ? ? ?Expected Discharge Plan: [place] ?Barriers to Discharge: Continued Medical Work up, No SNF bed ? ?Expected Discharge Plan and Services ?Expected Discharge Plan: [place] ?In-house Referral: Clinical Social Work, Hospice / [place], PCP / Health Connect ?Discharge Planning Services: CM Consult ?Post Acute Care Choice: Home Health ?Living arrangements for the past 2 months: [place] ?                ?DME Arranged: Hospital bed, Trach supplies, Tube feeding, Oxygen ?DME Agency: [employer] ?Date DME Agency Contacted: 03/18/21 ?Time DME Agency Contacted: 1000 ?Representative spoke with at DME Agency: Bethanne Ginger, CM with [employer] ?  ?  ?  ?  ?  ? ? ?Social Determinants of Health (SDOH) Interventions ?  ? ?Readmission Risk Interventions ?Readmission Risk Prevention Plan 02/20/2021  ?Transportation Screening Complete  ?Medication Review Press photographer) Complete  ?PCP or Specialist appointment within 3-5 days of discharge Not Complete  ?PCP/Specialist Appt Not Complete comments Patient will need LTC placement at SNF facility.  ?[place] or Home Care Consult Complete  ?SW Recovery Care/Counseling Consult Complete  ?Palliative Care Screening Complete  ?Skilled Nursing Facility Complete  ?Some recent data might be hidden  ? ? ?

## 2021-04-21 NOTE — Progress Notes (Signed)
? ?Progress Note ? ? ?Patient: Stephen Delgado [id number] DOB: May 02, 1965 DOA: 12/30/2020     112 ?DOS: the patient was seen and examined on 04/21/2021 ? ? ? ?  ?Brief hospital course: ?56 y.o. male with PMH significant for DM2, HTN, CKD with questionable compliance to medications ?Patient presented to the ED on 12/30/2020 with complaint of headache, altered mental status ?  ?In the ED, he was hypertensive to 238/160, agitated, required restraints ?CT head showed large amount of intraventricular hemorrhage involving lateral/third/fourth ventricle, ICH and left basal ganglia as well as chronic microvascular ischemic changes. ?He was started on Cleviprex drip and admitted to neuro ICU ?  ?He subsequently had CT head repeated on 11/30, 12/1 and 12/3, all demonstrated unchanged intraparenchymal hemorrhage and intraventricular hemorrhage ?12/3, MRI brain from also showed numerous small acute infarcts in both cerebral hemisphere with minimal improvement in the right superior cerebellum ?12/3, intubated ?12/13, tracheostomy ?12/15, wean from vent to trach collar ?12/16, PEG tube placement ?12/16, repeat MRI showed an increase in the size and number of acute infarcts in the bilateral hemispheres with increased involvement in the cerebellum bilaterally.   ?12/18, transferred from neuro service to [facility]  ?12/21, palliative care consulted.  Family wanted to continue aggressive care as full CODE STATUS. ?12/23 developed Cheyne-Stokes respiratory pattern with respiratory alkalosis.  Transferred to ICU for short-term mechanical ventilation and was found to have new tracheobronchitis now on cefepime ?12/25 back to progressive floor and to TRH ?  ?His hospital course has been complicated by significant neurological impairment, aspiration events, AKI, hypernatremia, urinary retention. ?1/9 trach changed to #6.0 Shiley cuffless--1/10 > 30 days since trach inserted. ? ?Assessment and Plan: ?Acute otitis media, right with mastoid  effusion ?Patient with recent bilateral conjunctivitis with some persistent injection and purulent drainage ?Patient's inability to report any symptoms a CTA of the maxillofacial area was completed that reveals new right middle ear and mastoid opacification consistent with otitis media with mastoid effusion ?Patient has demonstrated intermittent low-grade fevers since the 11th. ?In review of the literature from up-to-date typically this requires no treatment unless patient is experiencing fullness in the ear etc.  Patient is unable to describe any symptoms and given his inability to participate would not be a candidate for intranasal meds such as Afrin. ?Will eventually need follow-up CT to evaluate for resolution.  If not will require myringotomy tube evaluation from ENT ?Right TM unremarkable and without signs of infection ? ? ?Acute conjunctivitis, bilateral ?On exam today noted with bilateral scleral injection and yellow drainage accumulating on lashes and inner canthus ?Continue tobramycin ophthalmic to both eyes every 6 hours ?Chane and purulent drainage has resolved from right eye but continues in left eye.  This is concerning for possible underlying sinus infection so I have ordered a maxillofacial CT. states that patient has had issues with recurrent sinus problems for many years ? ?Constipation ?Had additional episode of constipation that appeared to be consistent with SBO on plain films but CT confirmed significant colonic stool retention ?Laxatives resumed ? ?Protein-calorie malnutrition/dysphagia due to recent stroke/constipation ?Nutrition Problem: Inadequate oral intake ?Etiology: lethargy/confusion, dysphagia ?Signs/Symptoms: NPO status ?Interventions: Tube feeding via PEG ?Body mass index is 23.33 kg/m?Marland Kitchen  ?Continue Glucerna ? ?  ? 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  As a precaution we will obtain EEG ? ?Palliative: ?  ?Reconsulted 3/17. Palliative team has had extensive goals of care discussions with this family several times - they have been consistent in requesting full scope care and long term care placement stating this would be acceptable quality of life for patient - from my review of chart it seems that goals of care are clear but disposition is an issue which is unfortunately not something we can assist with - i'm not sure [place] becoming involved again will be productive. Will discontinue consult, if other needs arise we can assist with or if family is interested in further goals of care discussions please reconsult as we are happy to reengage. ?-Eval per Ethics MD on 2/24: ? ?ETHICS CONSULT - BRIEF ?  ?Spoke yesterday with Erin Hearing NP - reviewed ethical principles of medical futility as applies to this patient?s case. Per her report, this has been a difficult placement issue and insurance payor has brought up stopping treatment, question of futility. Per Ms Lissa Merlin, patient is stable and his QOL is acceptable per his appropriate legal decision maker, who seems to be acting with best of intent and in ac cordance with what the patient would want for himself. This does not appear to be a case of medical futility. ONLY the attending physician in cooperation with other appropriate members of the treatment team may determine medical futility - insurance NEVER has this ability.  ? ? ? ?A physical therapy consult is indicated based on the patient?s mobility assessment. ? ? ?Mobility Assessment (last 72 hours)   ? ? Mobility Assessment   ? ? [place] Name 03/29/21 2000 03/29/21 1800 03/29/21 1400 03/29/21 1200 03/29/21 0800  ? Does patient have an  order for bedrest or is patient medically unstable Yes- Bedfast (Level 1) - Complete Yes- Bedfast (Level 1) - Complete Yes- Bedfast (Level 1) - Complete Yes- Bedfast (Level 1) - Complete Yes- Bedfast (Level 1) - Complete  ? What is the highest level of mobility based on the progressive mobility assessment? Level 1 (Bedfast) - Unable to balance while sitting on edge of bed -- Level 1 (Bedfast) - Unable to balance while sitting on edge of bed Level 1 (Bedfast) - Unable to balance while sitting on edge of bed Level 1 (Bedfast) - Unable to balance while sitting on edge of bed  ? Is the above level different from baseline mobility prior to current illness? No - Consider discontinuing PT/OT -- Yes - Recommend PT order Yes - Recommend PT order Yes - Recommend PT order  ? ? [place] Name 03/28/21 2000 03/28/21 0800  ?  ?  ?  ? Does patient have an order for bedrest or is patient medically unstable Yes- Bedfast (Level 1) - Complete Yes- Bedfast (Level 1) - Complete     ? What is the highest level of mobility based on the progressive mobility assessment? Level 1 (Bedfast) - Unable to balance while sitting on edge of bed Level 1 (Bedfast) - Unable to balance while sitting on edge of bed     ? Is the above level different from baseline mobility prior to current illness? No - Consider discontinuing PT/OT No - Consider discontinuing PT/OT     ? ?  ?  ? ?  ? ? ? ?Transaminitis ?-2/2 Zyprexa ?-Resolved ? ?Acute hypernatremia secondary to laxative induced diarrhea ?Resolved ?Laxatives resumed ? ? ?Acute kidney injury on  CKD 3b/acute azotemia with mild hypernatremia ?-Baseline renal function: BUN 18 and cr 1.49 ?-Recent cr 1.06 ?-Na stable-decrease free water to 200 cc q 4 hrs ?-Repeat BMET in am ? 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 ? ?Diabetes  mellitus type 2 in obese (HCC) ?-A1c 6.3 in 12/31/2020 ?-Continue SSI every 4 hours ?-CBGs well controlled ? ? ? ?  ? ?Malignant hypertension ?Continue Norvasc Coreg, hydralazine, and clonidine ?Current BP readings

## 2021-04-22 LAB — GLUCOSE, CAPILLARY
Glucose-Capillary: 136 mg/dL — ABNORMAL HIGH (ref 70–99)
Glucose-Capillary: 158 mg/dL — ABNORMAL HIGH (ref 70–99)
Glucose-Capillary: 154 mg/dL — ABNORMAL HIGH (ref 70–99)
Glucose-Capillary: 143 mg/dL — ABNORMAL HIGH (ref 70–99)
Glucose-Capillary: 109 mg/dL — ABNORMAL HIGH (ref 70–99)
Glucose-Capillary: 120 mg/dL — ABNORMAL HIGH (ref 70–99)

## 2021-04-22 NOTE — Progress Notes (Signed)
? ?Progress Note ? ? ?Patient: Stephen Delgado WJX:[id number] DOB: 1966/01/29 DOA: 12/30/2020     113 ?DOS: the patient was seen and examined on 04/22/2021 ? ? ? ?  ?Brief hospital course: ?56 y.o. male with PMH significant for DM2, HTN, CKD with questionable compliance to medications ?Patient presented to the ED on 12/30/2020 with complaint of headache, altered mental status ?  ?In the ED, he was hypertensive to 238/160, agitated, required restraints ?CT head showed large amount of intraventricular hemorrhage involving lateral/third/fourth ventricle, ICH and left basal ganglia as well as chronic microvascular ischemic changes. ?He was started on Cleviprex drip and admitted to neuro ICU ?  ?He subsequently had CT head repeated on 11/30, 12/1 and 12/3, all demonstrated unchanged intraparenchymal hemorrhage and intraventricular hemorrhage ?12/3, MRI brain from also showed numerous small acute infarcts in both cerebral hemisphere with minimal improvement in the right superior cerebellum ?12/3, intubated ?12/13, tracheostomy ?12/15, wean from vent to trach collar ?12/16, PEG tube placement ?12/16, repeat MRI showed an increase in the size and number of acute infarcts in the bilateral hemispheres with increased involvement in the cerebellum bilaterally.   ?12/18, [facility]  ?12/21, palliative care consulted.  Family wanted to continue aggressive care as full CODE STATUS. ?12/23 developed Cheyne-Stokes respiratory pattern with respiratory alkalosis.  Transferred to ICU for short-term mechanical ventilation and was found to have new tracheobronchitis now on cefepime ?12/25 back to progressive floor and to TRH ?  ?His hospital course has been complicated by significant neurological impairment, aspiration events, AKI, hypernatremia, urinary retention. ?1/9 trach changed to #6.0 Shiley cuffless--1/10 > 30 days since trach inserted. ? 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  Staff educating family regarding the care of this patient including suctioning, administration of medications and tube feedings ? ? ? ? ? ?A physical therapy consult is indicated based on the patient?s mobility assessment. ? ? ?Mobility Assessment (last 72 hours)   ? ? Mobility Assessment   ? ? Row Name 03/29/21 2000 03/29/21 1800 03/29/21 1400 03/29/21 1200 03/29/21 0800  ? Does patient have an order for bedrest or is patient medically unstable Yes- Bedfast (Level 1) - Complete Yes- Bedfast (Level 1) - Complete Yes- Bedfast (Level 1) - Complete Yes- Bedfast (Level 1) - Complete Yes- Bedfast (Level 1) - Complete  ? What is the highest level of mobility based on the progressive mobility assessment? Level 1 (Bedfast) - Unable to balance while sitting on edge of bed -- Level 1 (Bedfast) - Unable to balance while sitting on edge of bed Level 1 (Bedfast) - Unable to balance while sitting on edge of bed Level 1 (Bedfast) - Unable to balance while sitting on edge of bed  ? Is the above level different from baseline mobility prior to current illness? No - Consider discontinuing PT/OT -- Yes - Recommend PT order Yes - Recommend PT order Yes - Recommend PT order  ? ? Row Name 03/28/21 2000 03/28/21 0800  ?  ?  ?  ? Does patient have an order for bedrest or is patient medically unstable Yes- Bedfast (Level 1) - Complete Yes- Bedfast (Level 1) - Complete     ? What is the highest  level of mobility based on the progressive mobility assessment? Level 1 (Bedfast) - Unable to balance while sitting on edge of bed Level 1 (Bedfast) - Unable to balance while sitting on edge of bed     ? Is the above level  different from baseline mobility prior to current illness? No - Consider discontinuing PT/OT No - Consider discontinuing PT/OT     ? ?  ?  ? ?  ? ? ? ?Acute otitis media, right with mastoid effusion ?Patient with recent bilateral conjunctivitis with some persistent injection and purulent drainage ?CTA of the maxillofacial area was completed that revealed new right middle ear and mastoid opacification consistent with otitis media with mastoid effusion ?Will need follow-up CT to evaluate for resolution.  If not resolved consideration should be given to ENT evaluation to determine if a candidate for myringotomy tube  ?Right TM unremarkable and without signs of infection when examined ? ? ?SBO ruled out/constipation-resolved as of 04/14/2021 ?Developed emesis and abdominal distention on the afternoon of 3/9 ?Symptoms worsen.  KUB revealed SBO but CT revealed obstipation ?Symptoms resolved with resumption of laxatives ? ? ? ? 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 ? ?Malignant hypertension ?Continue Norvasc Coreg, hydralazine, and clonidine ?Current BP readings in the controlled range ? ?Diabetes mellitus type 2 in obese (HCC) ?-A1c 6.3 in 12/31/2020 ?-Continue SSI every 4 hours ?-CBGs well controlled ? ? ? ?  ? ?Acute hypernatremia secondary to laxative induced diarrhea ?Resolved ?Laxatives resumed ? ? ?Acute conjunctivitis, bilateral ?On exam today noted with bilateral scleral injection and yellow drainage accumulating on lashes and inner canthus ?Continue tobramycin ophthalmic to both eyes every 6 hours ?Chane and purulent drainage has resolved from right eye but continues in left eye.  This is  concerning for possible underlying sinus infection so I have ordered a maxillofacial CT. states that patient has had issues with recurrent sinus problems for many years ? ?Pneumonia due to Klebsiella pneumoniae (HCC)-resolved as of 04/01/2021 ?Resolved ? ?Protein-calorie malnutrition/dysphagia due to recent stroke/constipation ?Nutrition Problem: Inadequate oral intake ?Etiology: lethargy/confusion, dysphagia ?Signs/Symptoms: NPO status ?Interventions: Tube feeding via PEG ?Body mass index is 23.33 kg/m?Marland Kitchen  ?Continue Glucerna ? ?  ? ?Acute urinary retention-resolved as of 03/30/2021 ?-Continue Urecholine ?-Foley catheter discontinued on 1/11 and patient voiding easily ? ?Transaminitis ?-2/2 Zyprexa ?-Resolved ? ?MSSA (methicillin susceptible Staphylococcus aureus) pneumonia (HCC)-resolved as of 04/02/2021 ?Resolved ? ?Pressure injury of skin-resolved as of 04/02/2021 ?Resolved-prior anus ulcer ? ?Pneumonia due to Serratia marcescens (HCC)-resolved as of 04/01/2021 ?Resolved ? ?Acute kidney injury on CKD 3b/acute azotemia with mild hypernatremia ?-Baseline renal function: BUN 18 and cr 1.49 ?-Recent cr 1.06 ?-Na stable-decrease free water to 200 cc q 4 hrs ?-Repeat BMET in am ? ?Constipation ?Had additional episode of constipation that appeared to be consistent with SBO on plain films but CT confirmed significant colonic stool retention ?Laxatives resumed ? ? ? ? ? ? ? ?Subjective:  ?Eyes are open and patient grunting somewhat.  Does not track or focus on persons in room. ? ?Physical Exam: ?Vitals:  ? 04/22/21 0333 04/22/21 0400 04/22/21 0717 04/22/21 0759  ?BP:  140/88 134/75   ?Pulse: 84 88 83 86  ?Resp: 18 20 (!) 21 20  ?Temp:  99.8 ?F (  37.7 ?C) 98.5 ?F (36.9 ?C)   ?TempSrc:   Oral   ?SpO2: 98% 99% 98% 99%  ?Weight:      ?Height:      ? ?Constitutional: No acute distress, unresponsive with eyes open ?Respiratory: #6.0 Shiley cuffless trach, room air- lung sounds CTA bilaterally---normal respiratory pattern  ?Cardiovascular:  S1-S2, NSR, normotensive -no bilateral lower extremity edema  ?Abdomen:  Abdomen soft and nontender but does appear to be somewhat distended but not significantly.  Bowel sounds unable to be auscultated PEG tube -

## 2021-04-22 NOTE — TOC Progression Note (Addendum)
Transition of Care (TOC) - Progression Note  ? ? ?Patient Details  ?Name: Stephen Delgado ?MRN: [id number] ?Date of Birth: 01/24/1966 ? ?Transition of Care (TOC) CM/SW Contact  ? Stephen Stubbldfield, RN ?Phone Number: ?04/22/2021, 8:59 AM ? ?Clinical Narrative:    ?CM called the [facility] and scheduled a hospital follow up for May 13, 2021 with the  clinic that will allow the patient/family access to remote visits.  The patient's family will be updated so they can have access to physician visits remotely through My Chart. ? ?I left a detailed message with the patient's son on the phone and asked that the family sign up for My Chart on phone/computer so they will have access to physician remote visits when the patient is able to discharge home for care. ? ?CM called and spoke with Stephen Delgado, CM with [facility] and they states that they have been unable to reach the patient's son or wife by phone to discuss home health needs at the home.  I spoke with the patient's wife, Stephen Delgado yesterday and explained that [facility] may be available to assist with private duty nursing at the home and she was agreeable.  I plan to meet at the bedside with [facility] today at 1130 am so that the RNCM can complete a physical assessment for needs - pending discharge to home at a later date.  Bedside nursing was updated of visit today at the bedside. ? ?04/22/2021 1209- CM met with [facility] at the patient's bedside for assessment and home health needs.  [facility] left an information packet and contact card for the patient's family at the bedside.  I sent a message to the patient's son that information was left for the family and requested that they call and speak with [facility] to coordinate home health services and private duty hours needed for home.  [facility] states that they left a voicemail with the family but have been unable to reach them by phone. ? ?CM and MSW with DTP Team will  continue to follow the patient for discharge planning to home with home health - pending discharge to home at a later scheduled date. ? ? ?Expected Discharge Plan: Home w Home Health Services ?Barriers to Discharge: Continued Medical Work up, No SNF bed ? ?Expected Discharge Plan and Services ?Expected Discharge Plan: Home w Home Health Services ?In-house Referral: Clinical Social Work, Hospice / Palliative Care, PCP / Health Connect ?Discharge Planning Services: CM Consult ?Post Acute Care Choice: Home Health ?Living arrangements for the past 2 months: Single Family Home ?                ?DME Arranged: Hospital bed, Trach supplies, Tube feeding, Oxygen ?DME Agency: [employer] ?Date DME Agency Contacted: 03/18/21 ?Time DME Agency Contacted: 1000 ?Representative spoke with at DME Agency: Stephen Delgado, CM with [employer] ?  ?  ?  ?  ?  ? ? ?Social Determinants of Health (SDOH) Interventions ?  ? ?Readmission Risk Interventions ? ?  02/20/2021  ?  9:31 AM  ?Readmission Risk Prevention Plan  ?Transportation Screening Complete  ?Medication Review (RN Care Manager) Complete  ?PCP or Specialist appointment within 3-5 days of discharge Not Complete  ?PCP/Specialist Appt Not Complete comments Patient will need LTC placement at SNF facility.  ?HRI or Home Care Consult Complete  ?SW Recovery Care/Counseling Consult Complete  ?Palliative Care Screening Complete  ?Skilled Nursing Facility Complete  ? ? ?

## 2021-04-23 LAB — GLUCOSE, CAPILLARY
Glucose-Capillary: 131 mg/dL — ABNORMAL HIGH (ref 70–99)
Glucose-Capillary: 145 mg/dL — ABNORMAL HIGH (ref 70–99)
Glucose-Capillary: 148 mg/dL — ABNORMAL HIGH (ref 70–99)
Glucose-Capillary: 113 mg/dL — ABNORMAL HIGH (ref 70–99)
Glucose-Capillary: 128 mg/dL — ABNORMAL HIGH (ref 70–99)
Glucose-Capillary: 132 mg/dL — ABNORMAL HIGH (ref 70–99)

## 2021-04-23 MED ORDER — LINAGLIPTIN 5 MG PO TABS
5.0000 mg | ORAL_TABLET | Freq: Every day | ORAL | Status: DC
  Administered 2021-04-23 – 2021-04-29 (×7): 5 mg
  Filled 2021-04-23 (×8): qty 1

## 2021-04-23 MED ORDER — LORAZEPAM 2 MG/ML IJ SOLN
1.0000 mg | Freq: Once | INTRAMUSCULAR | Status: AC | PRN
  Administered 2021-05-06: 1 mg via INTRAVENOUS
  Filled 2021-04-23: qty 1

## 2021-04-23 NOTE — TOC Progression Note (Addendum)
Transition of Care (TOC) - Progression Note  ? ? ?Patient Details  ?Name: Stephen Delgado ?MRN: [id number] ?Date of Birth: [birth year] ? ?Transition of Care (TOC) CM/SW Contact  ?[name], RN ?Phone Number: ?04/23/2021, 8:16 AM ? ?Clinical Narrative:    ?CM called and spoke with the patient's son this morning and CM plans to meet with the patient's wife and two sons to discuss planned discharge to home on a pending later date.  I spoke with the son and asked that the patient's wife and son call [facility] to follow up since the company has been unable to reach them by phone and have not received return calls from the family as well.  Home Health plans for possible private duty nursing are being coordinated through [facility] or [employer] at this time to support care of the patient in the home. ? ?I called and left a message with Rob, CM with [facility] to discuss availability of home health services through the patient's insurance provider.  Will follow up. ? ?04/23/2021 1131- CM spoke with Janey Greaser, CM with [facility] and documents were signed by Dr. Lowell Guitar and returned to [facility] to start authorization for private duty nursing home care.  [facility] is requesting 55 hours/ week of private duty nursing in the home.  I spoke with Dr. Lowell Guitar and Deanna Artis, RN Assistant Director and patient's bedside nurse for the day will attend the family meeting today a 3 pm to discuss discharge plans for home and to establish time for the patient's wife and son to spend the night with the patient to learn bedside nursing care along with Trach care and airway support in the home. ? ?CM and MSW with DTP Team will continue to follow the patient for discharge planning needs to home at a later date. ? ? ?Expected Discharge Plan: Home w Home Health Services ?Barriers to Discharge: Continued Medical Work up, No SNF bed ? ?Expected Discharge Plan and Services ?Expected Discharge Plan: Home w Home Health  Services ?In-house Referral: Clinical Social Work, Hospice / Palliative Care, PCP / Health Connect ?Discharge Planning Services: CM Consult ?Post Acute Care Choice: Home Health ?Living arrangements for the past 2 months: Single Family Home ?                ?DME Arranged: Hospital bed, Trach supplies, Tube feeding, Oxygen ?DME Agency: [employer] ?Date DME Agency Contacted: 03/18/21 ?Time DME Agency Contacted: 1000 ?Representative spoke with at DME Agency: Jenness Corner, CM with [employer] ?  ?  ?  ?  ?  ? ? ?Social Determinants of Health (SDOH) Interventions ?  ? ?Readmission Risk Interventions ? ?  02/20/2021  ?  9:31 AM  ?Readmission Risk Prevention Plan  ?Transportation Screening Complete  ?Medication Review Oceanographer) Complete  ?PCP or Specialist appointment within 3-5 days of discharge Not Complete  ?PCP/Specialist Appt Not Complete comments Patient will need LTC placement at SNF facility.  ?HRI or Home Care Consult Complete  ?SW Recovery Care/Counseling Consult Complete  ?Palliative Care Screening Complete  ?Skilled Nursing Facility Complete  ? ? ?

## 2021-04-23 NOTE — Plan of Care (Signed)
Pt had a BM today. Meeting with Family today to discuss plans. Bath given x2. TF patent.  ? ?Problem: Health Behavior/Discharge Planning: ?Goal: Ability to manage health-related needs will improve ?Outcome: Progressing ?  ?Problem: Clinical Measurements: ?Goal: Ability to maintain clinical measurements within normal limits will improve ?Outcome: Progressing ?Goal: Will remain free from infection ?Outcome: Progressing ?Goal: Diagnostic test results will improve ?Outcome: Progressing ?Goal: Respiratory complications will improve ?Outcome: Progressing ?Goal: Cardiovascular complication will be avoided ?Outcome: Progressing ?  ?Problem: Activity: ?Goal: Risk for activity intolerance will decrease ?Outcome: Progressing ?  ?Problem: Nutrition: ?Goal: Adequate nutrition will be maintained ?Outcome: Progressing ?  ?Problem: Coping: ?Goal: Level of anxiety will decrease ?Outcome: Progressing ?  ?Problem: Elimination: ?Goal: Will not experience complications related to bowel motility ?Outcome: Progressing ?Goal: Will not experience complications related to urinary retention ?Outcome: Progressing ?  ?Problem: Pain Managment: ?Goal: General experience of comfort will improve ?Outcome: Progressing ?  ?Problem: Safety: ?Goal: Ability to remain free from injury will improve ?Outcome: Progressing ?  ?Problem: Skin Integrity: ?Goal: Risk for impaired skin integrity will decrease ?Outcome: Progressing ?  ?Problem: Education: ?Goal: Knowledge of disease or condition will improve ?Outcome: Progressing ?Goal: Knowledge of secondary prevention will improve (SELECT ALL) ?Outcome: Progressing ?Goal: Knowledge of patient specific risk factors will improve (INDIVIDUALIZE FOR PATIENT) ?Outcome: Progressing ?Goal: Individualized Educational Video(s) ?Outcome: Progressing ?  ?Problem: Coping: ?Goal: Will verbalize positive feelings about self ?Outcome: Progressing ?Goal: Will identify appropriate support needs ?Outcome: Progressing ?  ?Problem:  Health Behavior/Discharge Planning: ?Goal: Ability to manage health-related needs will improve ?Outcome: Progressing ?  ?Problem: Self-Care: ?Goal: Ability to participate in self-care as condition permits will improve ?Outcome: Progressing ?Goal: Verbalization of feelings and concerns over difficulty with self-care will improve ?Outcome: Progressing ?Goal: Ability to communicate needs accurately will improve ?Outcome: Progressing ?  ?Problem: Nutrition: ?Goal: Risk of aspiration will decrease ?Outcome: Progressing ?Goal: Dietary intake will improve ?Outcome: Progressing ?  ?Problem: Intracerebral Hemorrhage Tissue Perfusion: ?Goal: Complications of Intracerebral Hemorrhage will be minimized ?Outcome: Progressing ?  ?

## 2021-04-23 NOTE — Plan of Care (Signed)
  Problem: Clinical Measurements: Goal: Will remain free from infection Outcome: Progressing Goal: Respiratory complications will improve Outcome: Progressing Goal: Cardiovascular complication will be avoided Outcome: Progressing   Problem: Activity: Goal: Risk for activity intolerance will decrease Outcome: Progressing   

## 2021-04-23 NOTE — Progress Notes (Signed)
Patient seen today by trach team for consult.  No education is needed at this time.  All necessary equipment is at beside.   Will continue to follow for progression.  

## 2021-04-23 NOTE — Progress Notes (Signed)
? ?Progress Note ? ? ?Patient: Stephen Delgado [id number] DOB: 03-24-1965 DOA: 12/30/2020     114 ?DOS: the patient was seen and examined on 04/23/2021 ? ? ? ?  ?Brief hospital course: ?56 y.o. male with PMH significant for DM2, HTN, CKD with questionable compliance to medications ?Patient presented to the ED on 12/30/2020 with complaint of headache, altered mental status ?  ?In the ED, he was hypertensive to 238/160, agitated, required restraints ?CT head showed large amount of intraventricular hemorrhage involving lateral/third/fourth ventricle, ICH and left basal ganglia as well as chronic microvascular ischemic changes. ?He was started on Cleviprex drip and admitted to neuro ICU ?  ?He subsequently had CT head repeated on 11/30, 12/1 and 12/3, all demonstrated unchanged intraparenchymal hemorrhage and intraventricular hemorrhage ?12/3, MRI brain from also showed numerous small acute infarcts in both cerebral hemisphere with minimal improvement in the right superior cerebellum ?12/3, intubated ?12/13, tracheostomy ?12/15, wean from vent to trach collar ?12/16, PEG tube placement ?12/16, repeat MRI showed an increase in the size and number of acute infarcts in the bilateral hemispheres with increased involvement in the cerebellum bilaterally.   ?12/18, transferred from neuro service to [facility]  ?12/21, palliative care consulted.  Family wanted to continue aggressive care as full CODE STATUS. ?12/23 developed Cheyne-Stokes respiratory pattern with respiratory alkalosis.  Transferred to ICU for short-term mechanical ventilation and was found to have new tracheobronchitis now on cefepime ?12/25 back to progressive floor and to TRH ?  ?His hospital course has been complicated by significant neurological impairment, aspiration events, AKI, hypernatremia, urinary retention. ?1/9 trach changed to #6.0 Shiley cuffless--1/10 > 30 days since trach inserted. ? 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  Staff educating family regarding the care of this patient including suctioning, administration of medications and tube feedings ? ? ? ? ? ?A physical therapy consult is indicated based on the patient?s mobility assessment. ? ? ?Mobility Assessment (last 72 hours)   ? ? Mobility Assessment   ? ? [place] Name 03/29/21 2000 03/29/21 1800 03/29/21 1400 03/29/21 1200 03/29/21 0800  ? Does patient have an order for bedrest or is patient medically unstable Yes- Bedfast (Level 1) - Complete Yes- Bedfast (Level 1) - Complete Yes- Bedfast (Level 1) - Complete Yes- Bedfast (Level 1) - Complete Yes- Bedfast (Level 1) - Complete  ? What is the highest level of mobility based on the progressive mobility assessment? Level 1 (Bedfast) - Unable to balance while sitting on edge of bed -- Level 1 (Bedfast) - Unable to balance while sitting on edge of bed Level 1 (Bedfast) - Unable to balance while sitting on edge of bed Level 1 (Bedfast) - Unable to balance while sitting on edge of bed  ? Is the above level different from baseline mobility prior to current illness? No - Consider discontinuing PT/OT -- Yes - Recommend PT order Yes - Recommend PT order Yes - Recommend PT order  ? ? [place] Name 03/28/21 2000 03/28/21 0800  ?  ?  ?  ? Does patient have an order for bedrest or is patient medically unstable Yes- Bedfast (Level 1) - Complete Yes- Bedfast (Level 1) - Complete     ? What is the highest level of mobility based on the progressive mobility assessment? Level 1 (Bedfast) -  Unable to balance while sitting on edge of bed Level 1 (Bedfast) - Unable to balance while sitting on edge of bed     ? Is the above level different from baseline mobility prior to current illness? No - Consider discontinuing PT/OT No -  Consider discontinuing PT/OT     ? ?  ?  ? ?  ? ? ? ?Acute otitis media, right with mastoid effusion ?Patient with recent bilateral conjunctivitis with some persistent injection and purulent drainage ?CTA of the maxillofacial area was completed that revealed new right middle ear and mastoid opacification consistent with otitis media with mastoid effusion ?Will need follow-up CT to evaluate for resolution.  If not resolved consideration should be given to ENT evaluation to determine if a candidate for myringotomy tube --Right TM unremarkable and without signs of infection when examined ? ? ?SBO ruled out/constipation-resolved as of 04/14/2021 ?Developed emesis and abdominal distention on the afternoon of 3/9 ?Symptoms worsen.  KUB revealed SBO but CT revealed obstipation ?Symptoms resolved with resumption of laxatives ? ? ? ? 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 ? ?Malignant hypertension ?Continue Norvasc Coreg, hydralazine, and clonidine ? ? ?Diabetes mellitus type 2 in obese (HCC) ?-A1c 6.3 in 12/31/2020 ?-Continue SSI every 4 hours-of note patient receiving 9 units of SSI per shift on average.  To avoid family having to administer insulin in the home setting will begin Tradjenta per tube ?-CBGs well controlled ? ? ? ?  ? ?Acute hypernatremia secondary to laxative induced diarrhea ?Resolved ?Laxatives resumed ? ? ?Acute conjunctivitis, bilateral ?Resolved ? ?Pneumonia due to Klebsiella pneumoniae (HCC)-resolved as of 04/01/2021 ?Resolved ? ?Protein-calorie malnutrition/dysphagia due to recent stroke/constipation ?Nutrition Problem: Inadequate oral intake ?Etiology: lethargy/confusion,  dysphagia ?Signs/Symptoms: NPO status ?Interventions: Tube feeding via PEG ?Body mass index is 23.33 kg/m?Marland Kitchen  ?Continue Glucerna ? ?  ? ?Acute urinary retention-resolved as of 03/30/2021 ?-Continue Urecholine ?-Foley catheter discontinued on 1/11 and patient voiding easily ? ?Transaminitis ?-2/2 Zyprexa ?-Resolved ? ?MSSA (methicillin susceptible Staphylococcus aureus) pneumonia (HCC)-resolved as of 04/02/2021 ?Resolved ? ?Pressure injury of skin-resolved as of 04/02/2021 ?Resolved-prior anus ulcer ? ?Pneumonia due to Serratia marcescens (HCC)-resolved as of 04/01/2021 ?Resolved ? ?Acute kidney injury on CKD 3b/acute azotemia with mild hypernatremia ?-Baseline renal function: BUN 18 and cr 1.49 ?-Recent cr 1.06 ?-Na stable-decrease free water to 200 cc q 4 hrs ?-Repeat BMET in am ? ?Constipation ?Had additional episode of constipation that appeared to be consistent with SBO on plain films but CT confirmed significant colonic stool retention ?Laxatives resumed ? ? ? ? ? ? ? ?Subjective:  ?Unresponsive to verbal or tactile stimulation.  Eyes were open upon my entry into the room. ? ?Physical Exam: ?Vitals:  ? 04/23/21 0317 04/23/21 0341 04/23/21 0738 04/23/21 0900  ?BP:  (!) 142/80  (!) 151/74  ?Pulse: 85 84 84 87  ?Resp: 20 16 20 17   ?Temp:  98.5 ?F (36.9 ?C)  99.2 ?F (37.3 ?C)  ?TempSrc:  Axillary  Axillary  ?SpO2: 100% 98% 97% 100%  ?Weight:      ?Height:      ? ?Constitutional: No acute distress, unresponsive with eyes open ?Respiratory: #6.0 Shiley cuffless trach, room air- lung sounds CTA bilaterally---normal respiratory pattern  ?Cardiovascular: S1-S2, NSR, normotensive -no  bilateral lower extremity edema  ?Abdomen:  Abdomen soft and nontender but does appear to be somewhat distended but not significantly.  Bowel sounds unable to be auscultated PEG tube -normoactive bowels -LBM 3/20 ?Neurologic: Upper extremities are flaccid to PROM.-No further hypertonicity of lower extremities elicited with PROM.  Positive blink  reflex noted. ?Psychiatric: Unresponsive therefore unable to obtain accurate exam ? ?Data Reviewed: ?There are no new results to review at this time. ? ?Medically stable: ?Yes ? ?Family Communication:  ?Wife [name] 3/13-

## 2021-04-24 LAB — GLUCOSE, CAPILLARY
Glucose-Capillary: 110 mg/dL — ABNORMAL HIGH (ref 70–99)
Glucose-Capillary: 113 mg/dL — ABNORMAL HIGH (ref 70–99)
Glucose-Capillary: 117 mg/dL — ABNORMAL HIGH (ref 70–99)
Glucose-Capillary: 118 mg/dL — ABNORMAL HIGH (ref 70–99)
Glucose-Capillary: 123 mg/dL — ABNORMAL HIGH (ref 70–99)
Glucose-Capillary: 133 mg/dL — ABNORMAL HIGH (ref 70–99)

## 2021-04-24 NOTE — Progress Notes (Signed)
[facility] [id number] [facility]) Hospital Liaison note: ? ?New request for [facility] Palliative Care services. Will continue to follow for disposition. ? ?Please call with any outpatient palliative questions or concerns. ? ?Thank you for the opportunity to participate in this patient's care. ?  ?Thank you, ?Abran Cantor, LPN ?[facility] Hospital Liaison ?[phone] ?

## 2021-04-24 NOTE — Progress Notes (Cosign Needed)
?  ?  Durable Medical Equipment  ?(From admission, onward)  ?  ? ? ?  ? ?  Start     Ordered  ? 04/24/21 0812  For home use only DME oxygen  Once       ?Comments: 5l/min via trach collar delivering 21 % FIO2  ?Question Answer Comment  ?Length of Need Lifetime   ?Liters per Minute 5   ?Frequency Continuous (stationary and portable oxygen unit needed)   ?Oxygen conserving device Yes   ?Oxygen delivery system Gas   ?  ? 04/24/21 0812  ? 04/20/21 1248  For home use only DME Trach supplies  (For Home Use Only DME [employer])  Once       ?Question Answer Comment  ?Trach Type Shiley   ?Back Up Trach Type Shiley   ?Cuffed or Uncuffed Uncuffed   ?Fenestrated No   ?Size 6 mm   ?XLT Length No   ?Trach Supplies/Equipment for Home Use Tube Holder/Collar   ?Trach Supplies/Equipment for Home Use Tracheostomy Drain Dressings   ?Trach Supplies/Equipment for Home Use Medical Suction Machine   ?Trach Supplies/Equipment for Home Use Suction Catheters   ?Trach Supplies/Equipment for Home Use Tracheostomy Care Cleaning Kits   ?Trach Supplies/Equipment for Home Use Humidification (oxygen 5 liters via trach collar to provide specified % - if tracheostomy is capped with occlusive cap during day, a separate [place] order will need to be provided)   ?Suction Catheter Size 14 French (for size 6 or higher)   ?Cleaning Kits 60   ?Oxygen % Other (see comment) 5L/min humidified  ?  ? 04/20/21 1250  ? 04/20/21 1247  For home use only DME Tube feeding pump  Once       ?Question:  Length of Need  Answer:  Lifetime  ? 04/20/21 1246  ? 04/20/21 1246  For home use only DME Tube feeding  Once       ? 04/20/21 1246  ? 04/20/21 1240  For home use only DME Hospital bed  Once       ?Question Answer Comment  ?Length of Need Lifetime   ?Patient has (list medical condition): S/P intraventricular hemmorhage, Acute respiratory failure - trach, Malnutrition/ Dysphagia - PEG   ?The above medical condition requires: Patient requires the ability to reposition  frequently   ?Head must be elevated greater than: 30 degrees   ?Bed type Semi-electric   ?Support Surface: Gel Overlay   ?  ? 04/20/21 1245  ? ?  ?  ? ?  ?  ?

## 2021-04-24 NOTE — Progress Notes (Signed)
? ?Progress Note ? ? ?Patient: Stephen Delgado [id number] DOB: 06-22-1965 DOA: 12/30/2020     115 ?DOS: the patient was seen and examined on 04/24/2021 ? ? ? ?  ?Brief hospital course: ?56 y.o. male with PMH significant for DM2, HTN, CKD with questionable compliance to medications ?Patient presented to the ED on 12/30/2020 with complaint of headache, altered mental status ?  ?In the ED, he was hypertensive to 238/160, agitated, required restraints ?CT head showed large amount of intraventricular hemorrhage involving lateral/third/fourth ventricle, ICH and left basal ganglia as well as chronic microvascular ischemic changes. ?He was started on Cleviprex drip and admitted to neuro ICU ?  ?He subsequently had CT head repeated on 11/30, 12/1 and 12/3, all demonstrated unchanged intraparenchymal hemorrhage and intraventricular hemorrhage ?12/3, MRI brain from also showed numerous small acute infarcts in both cerebral hemisphere with minimal improvement in the right superior cerebellum ?12/3, intubated ?12/13, tracheostomy ?12/15, wean from vent to trach collar ?12/16, PEG tube placement ?12/16, repeat MRI showed an increase in the size and number of acute infarcts in the bilateral hemispheres with increased involvement in the cerebellum bilaterally.   ?12/18, transferred from neuro service to [facility]  ?12/21, palliative care consulted.  Family wanted to continue aggressive care as full CODE STATUS. ?12/23 developed Cheyne-Stokes respiratory pattern with respiratory alkalosis.  Transferred to ICU for short-term mechanical ventilation and was found to have new tracheobronchitis now on cefepime ?12/25 back to progressive floor and to TRH ?  ?His hospital course has been complicated by significant neurological impairment, aspiration events, AKI, hypernatremia, urinary retention. ?1/9 trach changed to #6.0 Shiley cuffless--1/10 > 30 days since trach inserted. ? 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  Staff educating family regarding the care of this patient including suctioning, administration of medications and tube feedings ? ? ? ? ? ?A physical therapy consult is indicated based on the patient?s mobility assessment. ? ? ?Mobility Assessment (last 72 hours)   ? ? Mobility Assessment   ? ? [place] Name 03/29/21 2000 03/29/21 1800 03/29/21 1400 03/29/21 1200 03/29/21 0800  ? Does patient have an order for bedrest or is patient medically unstable Yes- Bedfast (Level 1) - Complete Yes- Bedfast (Level 1) - Complete Yes- Bedfast (Level 1) - Complete Yes- Bedfast (Level 1) - Complete Yes- Bedfast (Level 1) - Complete  ? What is the highest level of mobility based on the progressive mobility assessment? Level 1 (Bedfast) - Unable to balance while sitting on edge of bed -- Level 1 (Bedfast) - Unable to balance while sitting on edge of bed Level 1 (Bedfast) - Unable to balance while sitting on edge of bed Level 1 (Bedfast) - Unable to balance while sitting on edge of bed  ? Is the above level different from baseline mobility prior to current illness? No - Consider discontinuing PT/OT -- Yes - Recommend PT order Yes - Recommend PT order Yes - Recommend PT order  ? ? [place] Name 03/28/21 2000 03/28/21 0800  ?  ?  ?  ? Does patient have an order for bedrest or is patient medically unstable Yes- Bedfast (Level 1) - Complete Yes- Bedfast (Level 1) - Complete     ? What is the highest level of mobility based on the progressive mobility assessment? Level 1 (Bedfast) -  Unable to balance while sitting on edge of bed Level 1 (Bedfast) - Unable to balance while sitting on edge of bed     ? Is the above level different from baseline mobility prior to current illness? No - Consider discontinuing PT/OT No -  Consider discontinuing PT/OT     ? ?  ?  ? ?  ? ? ? ?Acute otitis media, right with mastoid effusion ?Patient with recent bilateral conjunctivitis that persisted over 2 weeks despite pharmacological therapy ?Maxillofacial CT revealed new right middle ear and mastoid opacification consistent with otitis media with mastoid effusion ?Will need follow-up CT to evaluate for resolution.  If not resolved consideration should be given to ENT evaluation to determine if a candidate for myringotomy tube --Right TM unremarkable and without signs of infection when examined ? ? ?SBO ruled out/constipation-resolved as of 04/14/2021 ?Developed emesis and abdominal distention on the afternoon of 3/9 ?Symptoms worsen.  KUB revealed SBO but CT revealed obstipation ?Symptoms resolved with resumption of laxatives ? ? ? ? 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 ? ?Malignant hypertension ?Blood pressure stable on Norvasc Coreg, hydralazine, and clonidine ? ? ?Diabetes mellitus type 2 in obese (HCC) ?-A1c 6.3 in 12/31/2020 ?-Continue SSI every 4 hours-of note patient receiving 9 units of SSI per shift on average.  To avoid family having to administer insulin in the home setting [place] has been initiated.  CBGs have been stable-he is still requiring some supplemental insulin.  If continues to require supplemental insulin an additional agent will need to be added ?-CBGs well controlled ? ? ? ?  ? ?Acute hypernatremia secondary to laxative induced diarrhea ?Resolved ?Laxatives resumed ? ? ?Acute conjunctivitis, bilateral ?Resolved ? ?Pneumonia due to Klebsiella pneumoniae (HCC)-resolved as of  04/01/2021 ?Resolved ? ?Protein-calorie malnutrition/dysphagia due to recent stroke/constipation ?Nutrition Problem: Inadequate oral intake ?Etiology: lethargy/confusion, dysphagia ?Signs/Symptoms: NPO status ?Interventions: Tube feeding via PEG ?Body mass index is 23.33 kg/m?Marland Kitchen  ?Continue Glucerna ? ?  ? ?Acute urinary retention-resolved as of 03/30/2021 ?-Continue Urecholine ?-Foley catheter discontinued on 1/11 and patient voiding easily ? ?Transaminitis ?-2/2 Zyprexa ?-Resolved ? ?MSSA (methicillin susceptible Staphylococcus aureus) pneumonia (HCC)-resolved as of 04/02/2021 ?Resolved ? ?Pressure injury of skin-resolved as of 04/02/2021 ?Resolved-prior anus ulcer ? ?Pneumonia due to Serratia marcescens (HCC)-resolved as of 04/01/2021 ?Resolved ? ?Acute kidney injury on CKD 3b/acute azotemia with mild hypernatremia ?-Baseline renal function: BUN 18 and cr 1.49 ?-Recent cr 1.06 ?-Na stable-decrease free water to 200 cc q 4 hrs ?-Repeat BMET in am ? ?Constipation ?Had additional episode of constipation that appeared to be consistent with SBO on plain films but CT confirmed significant colonic stool retention ?Laxatives resumed ? ? ? ? ? ? ? ?Subjective:  ?Remains unresponsive to both verbal and tactile stimulation ? ?Physical Exam: ?Vitals:  ? 04/23/21 2322 04/24/21 0305 04/24/21 [id number] 04/24/21 0856  ?BP: 131/80 (!) 143/79 (!) 148/76   ?Pulse: 86 87 92 98  ?Resp: 17 17 20 20   ?Temp: 99.3 ?F (37.4 ?C) 99.3 ?F (37.4 ?C) 99.2 ?F (37.3 ?C)   ?TempSrc: Oral Oral Oral   ?SpO2: 99% 100% 98% 98%  ?Weight:      ?Height:      ? ?Constitutional: NAD, unresponsive with eyes open ?Respiratory: #6.0 Shiley  cuffless trach, room air- lung sounds CTA bilaterally---normal respiratory pattern  ?Cardiovascular: S1-S2, NSR, normotensive -no bilateral lower extremity edema  ?Abdomen:  Abdomen soft and nontender but does appear to be somewhat distended but not significantly.  Bowel sounds unable to be auscultated PEG tube -normoactive bowels -LBM  3/20 ?Neurologic: Upper extremities are flaccid to PROM.-No further hypertonicity of lower extremities elicited with PROM.  Positive blink reflex noted.  Eyes open but not tracking, not responding to tactile or verbal stimulation ?Psychi

## 2021-04-24 NOTE — TOC Progression Note (Addendum)
Transition of Care (TOC) - Progression Note  ? ? ?Patient Details  ?Name: Stephen Delgado ?MRN: [id number] ?Date of Birth: 30-Apr-1965 ? ?Transition of Care (TOC) CM/SW Contact  ?[name], RN ?Phone Number: ?04/24/2021, 11:22 AM ? ?Clinical Narrative:    ?CM called and spoke with Jenness Corner, CM at [employer] and requested review of patient's pending DME needs for home.  CM has requested that [employer] communicate dme needs through the patient's brother, Gaelan Glennon, when patient able to discharge at a later pending date.  DME include hospital bed, tracheostomy and suction supplies, oxygen supplies, tube feedings and tube feeding pump.  DME note and orders placed but may need to modify closer to discharge to home once private duty nursing is established along with family teaching with bedside nursing staff and respiratory care team. ? ?I sent a message to Burman Nieves to follow up and update [facility] Team regarding family meeting yesterday and follow up regarding pending insurance authorization.  I asked [facility] Team to please communicate needs with the patient's brother - since the patient's wife may have language barrier relating to medical information. ? ?CM and MSW with DTP Team continue to follow the patient for discharge needs to home - pending later date. ? ? ?Expected Discharge Plan: Home w Home Health Services ?Barriers to Discharge: Continued Medical Work up, No SNF bed ? ?Expected Discharge Plan and Services ?Expected Discharge Plan: Home w Home Health Services ?In-house Referral: Clinical Social Work, Hospice / Palliative Care, PCP / Health Connect ?Discharge Planning Services: CM Consult ?Post Acute Care Choice: Home Health ?Living arrangements for the past 2 months: Single Family Home ?                ?DME Arranged: Hospital bed, Trach supplies, Tube feeding, Oxygen ?DME Agency: [employer] ?Date DME Agency Contacted: 03/18/21 ?Time DME Agency Contacted: 1000 ?Representative spoke with at  DME Agency: Jenness Corner, CM with [employer] ?  ?  ?  ?  ?  ? ? ?Social Determinants of Health (SDOH) Interventions ?  ? ?Readmission Risk Interventions ? ?  02/20/2021  ?  9:31 AM  ?Readmission Risk Prevention Plan  ?Transportation Screening Complete  ?Medication Review Oceanographer) Complete  ?PCP or Specialist appointment within 3-5 days of discharge Not Complete  ?PCP/Specialist Appt Not Complete comments Patient will need LTC placement at SNF facility.  ?HRI or Home Care Consult Complete  ?SW Recovery Care/Counseling Consult Complete  ?Palliative Care Screening Complete  ?Skilled Nursing Facility Complete  ? ? ?

## 2021-04-24 NOTE — Plan of Care (Signed)
  Problem: Activity: Goal: Risk for activity intolerance will decrease Outcome: Progressing   Problem: Nutrition: Goal: Adequate nutrition will be maintained Outcome: Progressing   Problem: Safety: Goal: Ability to remain free from injury will improve Outcome: Progressing   Problem: Skin Integrity: Goal: Risk for impaired skin integrity will decrease Outcome: Progressing   

## 2021-04-24 NOTE — TOC Progression Note (Signed)
Transition of Care (TOC) - Progression Note  ? ? ?Patient Details  ?Name: Stephen Delgado ?MRN: [id number] ?Date of Birth: Sep 21, 1965 ? ?Transition of Care (TOC) CM/SW Contact  ?Curlene Labrum, RN ?Phone Number: ?04/24/2021, 7:54 AM ? ?Clinical Narrative:    ?CM met with the patient's wife, brother and son, Stephen Delgado, at the bedside to discuss transition to home with home health resources (Private Duty pending).  [name] and family members were agreeable to arrange time/days with nursing staff to learn care of the patient.  Varney Biles, Surveyor, quantity was present and agreeable to discuss with nursing staff/ respiratory to support teaching needs for family.  I spoke with the family about continuing to communicate regarding home needs - CM will contact the patient's brother for improved communication for needed resources since the patient's wife and son work during the day and have limited availability to cell phone and return calls for discussion.  ? ?CM discussed with the family that the wife and son, living in the home will need to arrange teaching days with bedside nursing starting this week.  The patient's brother states that he will encourage and support that this is accomplished and communicated with the nursing staff. ? ?CM will continue to follow up with [facility] for hopeful private duty nursing availability.  DME for the home will be supplied through [employer] and I will have [employer] reach out to the patient's brother to discuss costs for the family - will need hospital bed, feeding pump, tube feeding formula, trach equipment, oxygen, suction equipment and trach collar supplies.  The patient's family was updated and aware. ? ?CM and MSW with DTP Team will continue to support the family for discharge plans to home at a later date. ? ? ?Expected Discharge Plan: [place] ?Barriers to Discharge: Continued Medical Work up, No SNF bed ? ?Expected Discharge Plan and  Services ?Expected Discharge Plan: [place] ?In-house Referral: Clinical Social Work, Hospice / [place], PCP / [facility] ?Discharge Planning Services: CM Consult ?Post Acute Care Choice: Home Health ?Living arrangements for the past 2 months: [place] ?                ?DME Arranged: Hospital bed, Trach supplies, Tube feeding, Oxygen ?DME Agency: [employer] ?Date DME Agency Contacted: 03/18/21 ?Time DME Agency Contacted: 1000 ?Representative spoke with at DME Agency: Bethanne Ginger, CM with [employer] ?  ?  ?  ?  ?  ? ? ?Social Determinants of Health (SDOH) Interventions ?  ? ?Readmission Risk Interventions ? ?  02/20/2021  ?  9:31 AM  ?Readmission Risk Prevention Plan  ?Transportation Screening Complete  ?Medication Review Press photographer) Complete  ?PCP or Specialist appointment within 3-5 days of discharge Not Complete  ?PCP/Specialist Appt Not Complete comments Patient will need LTC placement at SNF facility.  ?[place] or Home Care Consult Complete  ?SW Recovery Care/Counseling Consult Complete  ?Palliative Care Screening Complete  ?Skilled Nursing Facility Complete  ? ? ?

## 2021-04-25 LAB — CBC WITH DIFFERENTIAL/PLATELET
Abs Immature Granulocytes: 0.09 10*3/uL — ABNORMAL HIGH (ref 0.00–0.07)
Basophils Absolute: 0.1 10*3/uL (ref 0.0–0.1)
Basophils Relative: 0 %
Eosinophils Absolute: 0.5 10*3/uL (ref 0.0–0.5)
Eosinophils Relative: 4 %
HCT: 28 % — ABNORMAL LOW (ref 39.0–52.0)
Hemoglobin: 8.5 g/dL — ABNORMAL LOW (ref 13.0–17.0)
Immature Granulocytes: 1 %
Lymphocytes Relative: 16 %
Lymphs Abs: 1.9 10*3/uL (ref 0.7–4.0)
MCH: 23.7 pg — ABNORMAL LOW (ref 26.0–34.0)
MCHC: 30.4 g/dL (ref 30.0–36.0)
MCV: 78.2 fL — ABNORMAL LOW (ref 80.0–100.0)
Monocytes Absolute: 0.9 10*3/uL (ref 0.1–1.0)
Monocytes Relative: 7 %
Neutro Abs: 8.4 10*3/uL — ABNORMAL HIGH (ref 1.7–7.7)
Neutrophils Relative %: 72 %
Platelets: 228 10*3/uL (ref 150–400)
RBC: 3.58 MIL/uL — ABNORMAL LOW (ref 4.22–5.81)
RDW: 16.9 % — ABNORMAL HIGH (ref 11.5–15.5)
WBC: 11.7 10*3/uL — ABNORMAL HIGH (ref 4.0–10.5)
nRBC: 0 % (ref 0.0–0.2)

## 2021-04-25 LAB — COMPREHENSIVE METABOLIC PANEL
ALT: 90 U/L — ABNORMAL HIGH (ref 0–44)
AST: 45 U/L — ABNORMAL HIGH (ref 15–41)
Albumin: 2.3 g/dL — ABNORMAL LOW (ref 3.5–5.0)
Alkaline Phosphatase: 57 U/L (ref 38–126)
Anion gap: 8 (ref 5–15)
BUN: 46 mg/dL — ABNORMAL HIGH (ref 6–20)
CO2: 25 mmol/L (ref 22–32)
Calcium: 9.6 mg/dL (ref 8.9–10.3)
Chloride: 114 mmol/L — ABNORMAL HIGH (ref 98–111)
Creatinine, Ser: 1.17 mg/dL (ref 0.61–1.24)
GFR, Estimated: >60 mL/min (ref >60)
Glucose, Bld: 130 mg/dL — ABNORMAL HIGH (ref 70–99)
Potassium: 4 mmol/L (ref 3.5–5.1)
Sodium: 147 mmol/L — ABNORMAL HIGH (ref 135–145)
Total Bilirubin: 0.4 mg/dL (ref 0.3–1.2)
Total Protein: 6.4 g/dL — ABNORMAL LOW (ref 6.5–8.1)

## 2021-04-25 LAB — BASIC METABOLIC PANEL
Anion gap: 7 (ref 5–15)
BUN: 48 mg/dL — ABNORMAL HIGH (ref 6–20)
CO2: 25 mmol/L (ref 22–32)
Calcium: 9.4 mg/dL (ref 8.9–10.3)
Chloride: 116 mmol/L — ABNORMAL HIGH (ref 98–111)
Creatinine, Ser: 1.13 mg/dL (ref 0.61–1.24)
GFR, Estimated: >60 mL/min (ref >60)
Glucose, Bld: 128 mg/dL — ABNORMAL HIGH (ref 70–99)
Potassium: 3.9 mmol/L (ref 3.5–5.1)
Sodium: 148 mmol/L — ABNORMAL HIGH (ref 135–145)

## 2021-04-25 LAB — IRON AND TIBC
Iron: 30 ug/dL — ABNORMAL LOW (ref 45–182)
Saturation Ratios: 15 % — ABNORMAL LOW (ref 17.9–39.5)
TIBC: 203 ug/dL — ABNORMAL LOW (ref 250–450)
UIBC: 173 ug/dL

## 2021-04-25 LAB — FERRITIN: Ferritin: 604 ng/mL — ABNORMAL HIGH (ref 24–336)

## 2021-04-25 LAB — HEMOGLOBIN AND HEMATOCRIT, BLOOD
HCT: 28.6 % — ABNORMAL LOW (ref 39.0–52.0)
Hemoglobin: 8.7 g/dL — ABNORMAL LOW (ref 13.0–17.0)

## 2021-04-25 LAB — FOLATE: Folate: 15 ng/mL (ref >5.9)

## 2021-04-25 LAB — GLUCOSE, CAPILLARY
Glucose-Capillary: 111 mg/dL — ABNORMAL HIGH (ref 70–99)
Glucose-Capillary: 129 mg/dL — ABNORMAL HIGH (ref 70–99)
Glucose-Capillary: 143 mg/dL — ABNORMAL HIGH (ref 70–99)
Glucose-Capillary: 133 mg/dL — ABNORMAL HIGH (ref 70–99)
Glucose-Capillary: 129 mg/dL — ABNORMAL HIGH (ref 70–99)
Glucose-Capillary: 123 mg/dL — ABNORMAL HIGH (ref 70–99)

## 2021-04-25 LAB — VITAMIN B12: Vitamin B-12: 1288 pg/mL — ABNORMAL HIGH (ref 180–914)

## 2021-04-25 MED ORDER — FREE WATER
250.0000 mL | Status: DC
  Administered 2021-04-25 – 2021-04-26 (×9): 250 mL

## 2021-04-25 NOTE — Progress Notes (Signed)
? ?Progress Note ? ? ?Patient: Stephen Delgado [id number] DOB: February 01, 1966 DOA: 12/30/2020     116 ?DOS: the patient was seen and examined on 04/25/2021 ? ? ? ?  ?Brief hospital course: ?56 y.o. male with PMH significant for DM2, HTN, CKD with questionable compliance to medications ?Patient presented to the ED on 12/30/2020 with complaint of headache, altered mental status ?  ?In the ED, he was hypertensive to 238/160, agitated, required restraints ?CT head showed large amount of intraventricular hemorrhage involving lateral/third/fourth ventricle, ICH and left basal ganglia as well as chronic microvascular ischemic changes. ?He was started on Cleviprex drip and admitted to neuro ICU ?  ?He subsequently had CT head repeated on 11/30, 12/1 and 12/3, all demonstrated unchanged intraparenchymal hemorrhage and intraventricular hemorrhage ?12/3, MRI brain from also showed numerous small acute infarcts in both cerebral hemisphere with minimal improvement in the right superior cerebellum ?12/3, intubated ?12/13, tracheostomy ?12/15, wean from vent to trach collar ?12/16, PEG tube placement ?12/16, repeat MRI showed an increase in the size and number of acute infarcts in the bilateral hemispheres with increased involvement in the cerebellum bilaterally.   ?12/18, transferred from neuro service to [facility]  ?12/21, palliative care consulted.  Family wanted to continue aggressive care as full CODE STATUS. ?12/23 developed Cheyne-Stokes respiratory pattern with respiratory alkalosis.  Transferred to ICU for short-term mechanical ventilation and was found to have new tracheobronchitis now on cefepime ?12/25 back to progressive floor and to TRH ?  ?His hospital course has been complicated by significant neurological impairment, aspiration events, AKI, hypernatremia, urinary retention. ?1/9 trach changed to #6.0 Shiley cuffless--1/10 > 30 days since trach inserted. ? 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  Staff educating family regarding the care of this patient including suctioning, administration of medications and tube feedings ? ? ? ? ? ?Stephen Delgado physical therapy consult is indicated based on the patient?s mobility assessment. ? ? ?Mobility Assessment (last 72 hours)   ? ? Mobility Assessment   ? ? Stephen Delgado 03/29/21 2000 03/29/21 1800 03/29/21 1400 03/29/21 1200 03/29/21 0800  ? Does patient have an order for bedrest or is patient medically unstable Yes- Bedfast (Level 1) - Complete Yes- Bedfast (Level 1) - Complete Yes- Bedfast (Level 1) - Complete Yes- Bedfast (Level 1) - Complete Yes- Bedfast (Level 1) - Complete  ? What is the highest level of mobility based on the progressive mobility assessment? Level 1 (Bedfast) - Unable to balance while sitting on edge of bed -- Level 1 (Bedfast) - Unable to balance while sitting on edge of bed Level 1 (Bedfast) - Unable to balance while sitting on edge of bed Level 1 (Bedfast) - Unable to balance while sitting on edge of bed  ? Is the above level different from baseline mobility prior to current illness? No - Consider discontinuing PT/OT -- Yes - Recommend PT order Yes - Recommend PT order Yes - Recommend PT order  ? ? Stephen Delgado 03/28/21 2000 03/28/21 0800  ?  ?  ?  ? Does patient have an order for bedrest or is patient medically unstable Yes- Bedfast (Level 1) - Complete Yes- Bedfast (Level 1) - Complete     ? What is the highest level of mobility based on the progressive mobility assessment? Level 1 (Bedfast) - Unable to  balance while sitting on edge of bed Level 1 (Bedfast) - Unable to balance while sitting on edge of bed     ? Is the above level different from baseline mobility prior to current illness? No - Consider discontinuing PT/OT No - Consider  discontinuing PT/OT     ? ?  ?  ? ?  ? ? ? 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 ? ?Malignant hypertension ?Blood pressure stable on Norvasc Coreg, hydralazine, and clonidine ? ? ?Diabetes mellitus type 2 in obese (HCC) ?-A1c 6.3 in 12/31/2020 ?-Continue SSI every 4 hours-of note patient receiving 9 units of SSI per shift on average.  To avoid family having to administer insulin in the home setting Stephen Delgado has been initiated.  CBGs have been stable-he is still requiring some supplemental insulin.  If continues to require supplemental insulin an additional agent will need to be added ?-CBGs well controlled ? ? ? ?  ? ?Acute kidney injury on CKD 3b/acute azotemia with mild hypernatremia ?Hypernatremia, increase free water to 250 q4 and follow ? ?hypernatremia ?Increase free water flushes and follow ? ? ?Acute otitis media, right with mastoid effusion ?Patient with recent bilateral conjunctivitis that persisted over 2 weeks despite pharmacological therapy ?Maxillofacial CT revealed new right middle ear and mastoid opacification consistent with otitis media with mastoid effusion ?Will need follow-up CT to evaluate for resolution.  If not resolved consideration should be given to ENT evaluation to determine if Stephen Delgado candidate for myringotomy tube --Right TM unremarkable and without signs of infection when examined ? ? ?Acute conjunctivitis, bilateral ?Resolved ? ?Constipation ?Had additional episode of constipation that appeared to be consistent with SBO on plain films but CT confirmed significant colonic stool retention ?Laxatives resumed ? ?Protein-calorie malnutrition/dysphagia due to recent  stroke/constipation ?Nutrition Problem: Inadequate oral intake ?Etiology: lethargy/confusion, dysphagia ?Signs/Symptoms: NPO status ?Interventions: Tube feeding via PEG ?Body mass index is 23.33 kg/m?Marland Kitchen  ?Continue Glucerna ? ?  ? ?Transaminitis ?-2/2 Zyprexa ?-Resolved ? ?SBO ruled out/constipation-resolved as of 04/14/2021 ?Developed emesis and abdominal distention on the afternoon of 3/9 ?Symptoms worsen.  KUB revealed SBO but CT revealed obstipation ?Symptoms resolved with resumption of laxatives ? ? ? ? ?Pneumonia due to Klebsiella pneumoniae (HCC)-resolved as of 04/01/2021 ?Resolved ? ?Pressure injury of skin-resolved as of 04/02/2021 ?Resolved-prior anus ulcer ? ?Acute urinary retention-resolved as of 03/30/2021 ?-Continue Urecholine ?-Foley catheter discontinued on 1/11 and patient voiding easily ? ?MSSA (methicillin susceptible Staphylococcus aureus) pneumonia (HCC)-resolved as of 04/02/2021 ?Resolved ? ?Pneumonia due to Serratia marcescens (HCC)-resolved as of 04/01/2021 ?Resolved ? ? ? ? ? ? ? ?Subjective:  ?unresponsive ? ?Physical Exam: ?Vitals:  ? 04/25/21 1500 04/25/21 1658 04/25/21 1839 04/25/21 1929  ?BP: 99/61  137/69   ?Pulse: 93 93 97 86  ?Resp: 18 14 15 20   ?Temp: 97.8 ?F (36.6 ?C)     ?TempSrc: Axillary     ?SpO2: 100% 100%  100%  ?Weight:      ?Height:      ? ?Constitutional: NAD, unresponsive ?Respiratory: trach, unlabored ?Cardiovascular: RRR, no LEE ?Abdomen:  s/nt/nd, peg ?Neurologic: unresponsive, paraplegic  ? ?Data Reviewed: ?There are no new results to review at this time. ? ?Medically stable: ?Yes ? ?Family Communication:  ?Wife at bedside 3/25 ? ?  DVT Prophylaxis  ., Scd's  ?Heparin injection 5,000 units  ? ?Disposition: ?Status: ?Remains inpatient appropriate because:  ?Remains in [place] persistent vegetative state, trach dependent and currently has no funding for long-term placement.  Primary insurance has stopped paying on current stay and note long-term care options have been presented from  outside facilities and patient is not yet eligible for Medicaid due to need to spend down assets.  Have discussed at length with TOC providers and TOC administration and discharge plan will need to change to discharge home with h

## 2021-04-25 NOTE — Plan of Care (Signed)
?  Problem: Clinical Measurements: ?Goal: Will remain free from infection ?Outcome: Progressing ?  ?Problem: Nutrition: ?Goal: Adequate nutrition will be maintained ?Outcome: Progressing ?  ?Problem: Elimination: ?Goal: Will not experience complications related to bowel motility ?Outcome: Progressing ?  ?Problem: Skin Integrity: ?Goal: Risk for impaired skin integrity will decrease ?Outcome: Progressing ?  ?

## 2021-04-26 DIAGNOSIS — D649 Anemia, unspecified: Secondary | ICD-10-CM

## 2021-04-26 LAB — COMPREHENSIVE METABOLIC PANEL
ALT: 85 U/L — ABNORMAL HIGH (ref 0–44)
AST: 40 U/L (ref 15–41)
Albumin: 2.3 g/dL — ABNORMAL LOW (ref 3.5–5.0)
Alkaline Phosphatase: 56 U/L (ref 38–126)
Anion gap: 8 (ref 5–15)
BUN: 51 mg/dL — ABNORMAL HIGH (ref 6–20)
CO2: 25 mmol/L (ref 22–32)
Calcium: 9.4 mg/dL (ref 8.9–10.3)
Chloride: 114 mmol/L — ABNORMAL HIGH (ref 98–111)
Creatinine, Ser: 1.22 mg/dL (ref 0.61–1.24)
GFR, Estimated: >60 mL/min (ref >60)
Glucose, Bld: 126 mg/dL — ABNORMAL HIGH (ref 70–99)
Potassium: 3.8 mmol/L (ref 3.5–5.1)
Sodium: 147 mmol/L — ABNORMAL HIGH (ref 135–145)
Total Bilirubin: 0.4 mg/dL (ref 0.3–1.2)
Total Protein: 6.2 g/dL — ABNORMAL LOW (ref 6.5–8.1)

## 2021-04-26 LAB — CBC WITH DIFFERENTIAL/PLATELET
Abs Immature Granulocytes: 0.08 10*3/uL — ABNORMAL HIGH (ref 0.00–0.07)
Basophils Absolute: 0 10*3/uL (ref 0.0–0.1)
Basophils Relative: 0 %
Eosinophils Absolute: 0.5 10*3/uL (ref 0.0–0.5)
Eosinophils Relative: 4 %
HCT: 26.7 % — ABNORMAL LOW (ref 39.0–52.0)
Hemoglobin: 8.1 g/dL — ABNORMAL LOW (ref 13.0–17.0)
Immature Granulocytes: 1 %
Lymphocytes Relative: 18 %
Lymphs Abs: 2 10*3/uL (ref 0.7–4.0)
MCH: 23.8 pg — ABNORMAL LOW (ref 26.0–34.0)
MCHC: 30.3 g/dL (ref 30.0–36.0)
MCV: 78.5 fL — ABNORMAL LOW (ref 80.0–100.0)
Monocytes Absolute: 1 10*3/uL (ref 0.1–1.0)
Monocytes Relative: 9 %
Neutro Abs: 7.7 10*3/uL (ref 1.7–7.7)
Neutrophils Relative %: 68 %
Platelets: 229 10*3/uL (ref 150–400)
RBC: 3.4 MIL/uL — ABNORMAL LOW (ref 4.22–5.81)
RDW: 17 % — ABNORMAL HIGH (ref 11.5–15.5)
WBC: 11.3 10*3/uL — ABNORMAL HIGH (ref 4.0–10.5)
nRBC: 0 % (ref 0.0–0.2)

## 2021-04-26 LAB — BASIC METABOLIC PANEL
Anion gap: 7 (ref 5–15)
BUN: 55 mg/dL — ABNORMAL HIGH (ref 6–20)
CO2: 26 mmol/L (ref 22–32)
Calcium: 9.6 mg/dL (ref 8.9–10.3)
Chloride: 116 mmol/L — ABNORMAL HIGH (ref 98–111)
Creatinine, Ser: 1.25 mg/dL — ABNORMAL HIGH (ref 0.61–1.24)
GFR, Estimated: >60 mL/min (ref >60)
Glucose, Bld: 139 mg/dL — ABNORMAL HIGH (ref 70–99)
Potassium: 3.9 mmol/L (ref 3.5–5.1)
Sodium: 149 mmol/L — ABNORMAL HIGH (ref 135–145)

## 2021-04-26 LAB — GLUCOSE, CAPILLARY
Glucose-Capillary: 113 mg/dL — ABNORMAL HIGH (ref 70–99)
Glucose-Capillary: 117 mg/dL — ABNORMAL HIGH (ref 70–99)
Glucose-Capillary: 120 mg/dL — ABNORMAL HIGH (ref 70–99)
Glucose-Capillary: 123 mg/dL — ABNORMAL HIGH (ref 70–99)
Glucose-Capillary: 141 mg/dL — ABNORMAL HIGH (ref 70–99)
Glucose-Capillary: 147 mg/dL — ABNORMAL HIGH (ref 70–99)

## 2021-04-26 LAB — MAGNESIUM: Magnesium: 2.4 mg/dL (ref 1.7–2.4)

## 2021-04-26 LAB — PHOSPHORUS: Phosphorus: 3.7 mg/dL (ref 2.5–4.6)

## 2021-04-26 MED ORDER — FREE WATER
300.0000 mL | Status: DC
  Administered 2021-04-26 – 2021-04-27 (×3): 300 mL

## 2021-04-26 NOTE — Assessment & Plan Note (Addendum)
With hydration hemoglobin has decreased to 7.7 with an MCV of 79 ?Continue to follow ?Unclear if this is solely related to recent hydration or true reading ?

## 2021-04-26 NOTE — Progress Notes (Signed)
? ?Progress Note ? ? ?Patient: Stephen Delgado [id number] DOB: 04/15/1965 DOA: 12/30/2020     117 ?DOS: the patient was seen and examined on 04/26/2021 ? ? ? ?  ?Brief hospital course: ?56 y.o. male with PMH significant for DM2, HTN, CKD with questionable compliance to medications ?Patient presented to the ED on 12/30/2020 with complaint of headache, altered mental status ?  ?In the ED, he was hypertensive to 238/160, agitated, required restraints ?CT head showed large amount of intraventricular hemorrhage involving lateral/third/fourth ventricle, ICH and left basal ganglia as well as chronic microvascular ischemic changes. ?He was started on Cleviprex drip and admitted to neuro ICU ?  ?He subsequently had CT head repeated on 11/30, 12/1 and 12/3, all demonstrated unchanged intraparenchymal hemorrhage and intraventricular hemorrhage ?12/3, MRI brain from also showed numerous small acute infarcts in both cerebral hemisphere with minimal improvement in the right superior cerebellum ?12/3, intubated ?12/13, tracheostomy ?12/15, wean from vent to trach collar ?12/16, PEG tube placement ?12/16, repeat MRI showed an increase in the size and number of acute infarcts in the bilateral hemispheres with increased involvement in the cerebellum bilaterally.   ?12/18, transferred from neuro service to [facility]  ?12/21, palliative care consulted.  Family wanted to continue aggressive care as full CODE STATUS. ?12/23 developed Cheyne-Stokes respiratory pattern with respiratory alkalosis.  Transferred to ICU for short-term mechanical ventilation and was found to have new tracheobronchitis now on cefepime ?12/25 back to progressive floor and to TRH ?  ?His hospital course has been complicated by significant neurological impairment, aspiration events, AKI, hypernatremia, urinary retention. ?1/9 trach changed to #6.0 Shiley cuffless--1/10 > 30 days since trach inserted. ? 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  Staff educating family regarding the care of this patient including suctioning, administration of medications and tube feedings ? ? ? ? ? ?Stephen Delgado physical therapy consult is indicated based on the patient?s mobility assessment. ? ? ?Mobility Assessment (last 72 hours)   ? ? Mobility Assessment   ? ? [place] Name 03/29/21 2000 03/29/21 1800 03/29/21 1400 03/29/21 1200 03/29/21 0800  ? Does patient have an order for bedrest or is patient medically unstable Yes- Bedfast (Level 1) - Complete Yes- Bedfast (Level 1) - Complete Yes- Bedfast (Level 1) - Complete Yes- Bedfast (Level 1) - Complete Yes- Bedfast (Level 1) - Complete  ? What is the highest level of mobility based on the progressive mobility assessment? Level 1 (Bedfast) - Unable to balance while sitting on edge of bed -- Level 1 (Bedfast) - Unable to balance while sitting on edge of bed Level 1 (Bedfast) - Unable to balance while sitting on edge of bed Level 1 (Bedfast) - Unable to balance while sitting on edge of bed  ? Is the above level different from baseline mobility prior to current illness? No - Consider discontinuing PT/OT -- Yes - Recommend PT order Yes - Recommend PT order Yes - Recommend PT order  ? ? [place] Name 03/28/21 2000 03/28/21 0800  ?  ?  ?  ? Does patient have an order for bedrest or is patient medically unstable Yes- Bedfast (Level 1) - Complete Yes- Bedfast (Level 1) - Complete     ? What is the highest level of mobility based on the progressive mobility assessment? Level 1 (Bedfast) - Unable to  balance while sitting on edge of bed Level 1 (Bedfast) - Unable to balance while sitting on edge of bed     ? Is the above level different from baseline mobility prior to current illness? No - Consider discontinuing PT/OT No - Consider  discontinuing PT/OT     ? ?  ?  ? ?  ? ? ? 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 ? ?Malignant hypertension ?Blood pressure stable on Norvasc Coreg, hydralazine, and clonidine ? ? ?Diabetes mellitus type 2 in obese (HCC) ?-A1c 6.3 in 12/31/2020 ?-Continue SSI every 4 hours ?-tradjenta  ?-CBGs well controlled ? ? ? ?  ? ?Acute kidney injury on CKD 3b/acute azotemia with mild hypernatremia ?Hypernatremia worsening, increase free water to 300 q4 and follow ? ?hypernatremia ?Increase free water flushes and follow ? ? ?Anemia ?Hb downtrending, follow  ?No evidence bleeding, labs c/w AOCD and iron def ? ?Acute otitis media, right with mastoid effusion ?Patient with recent bilateral conjunctivitis that persisted over 2 weeks despite pharmacological therapy ?Maxillofacial CT revealed new right middle ear and mastoid opacification consistent with otitis media with mastoid effusion ?Will need follow-up CT to evaluate for resolution.  If not resolved consideration should be given to ENT evaluation to determine if Stephen Delgado candidate for myringotomy tube --Right TM unremarkable and without signs of infection when examined ? ? ?Acute conjunctivitis, bilateral ?Resolved ? ?Constipation ?Had additional episode of constipation that appeared to be consistent with SBO on plain films but CT confirmed significant colonic stool retention ?Laxatives resumed ? ?Protein-calorie malnutrition/dysphagia due to recent stroke/constipation ?Nutrition Problem: Inadequate oral intake ?Etiology: lethargy/confusion, dysphagia ?Signs/Symptoms: NPO  status ?Interventions: Tube feeding via PEG ?Body mass index is 23.33 kg/m?Marland Kitchen  ?Continue Glucerna ? ?  ? ?Transaminitis ?-2/2 Zyprexa ?-Resolved ? ?SBO ruled out/constipation-resolved as of 04/14/2021 ?Developed emesis and abdominal distention on the afternoon of 3/9 ?Symptoms worsen.  KUB revealed SBO but CT revealed obstipation ?Symptoms resolved with resumption of laxatives ? ? ? ? ?Pneumonia due to Klebsiella pneumoniae (HCC)-resolved as of 04/01/2021 ?Resolved ? ?Pressure injury of skin-resolved as of 04/02/2021 ?Resolved-prior anus ulcer ? ?Acute urinary retention-resolved as of 03/30/2021 ?-Continue Urecholine ?-Foley catheter discontinued on 1/11 and patient voiding easily ? ?MSSA (methicillin susceptible Staphylococcus aureus) pneumonia (HCC)-resolved as of 04/02/2021 ?Resolved ? ?Pneumonia due to Serratia marcescens (HCC)-resolved as of 04/01/2021 ?Resolved ? ? ? ? ? ? ? ?Subjective:  ?unrepsonsive ? ?Physical Exam: ?Vitals:  ? 04/26/21 1125 04/26/21 1210 04/26/21 1509 04/26/21 1523  ?BP: 123/63   136/89  ?Pulse: 81 81 89 64  ?Resp: 20 20 18    ?Temp: 98.8 ?F (37.1 ?C)   (!) 97.4 ?F (36.3 ?C)  ?TempSrc: Oral   Oral  ?SpO2: 98% 98% 99% 100%  ?Weight:      ?Height:      ? ?Constitutional: NAD, unresponsive ?Respiratory: trach on 8 L humidified air by trach collar, unlabored ?Cardiovascular: RRR, no LEE ?Abdomen:  s/nt/nd, peg ?Neurologic: unresponsive, perrl, flaccid upper extremites, no meaningful movement ? ?Data Reviewed: ?There are no new results to review at this time. ? ?Medically stable: ?Yes ? ?Family Communication:  ?Wife at bedside 3/25 ? ? DVT Prophylaxis  ., Scd's  ?  Heparin injection 5,000 units  ? ?Disposition: ?Status: ?Remains inpatient appropriate because:  ?Remains in [place] persistent vegetative state, trach dependent and currently has no funding for long-term placement.  Primary insurance has stopped paying on current stay and note long-term care options have been presented from outside facilities and  patient is not yet eligible for Medicaid due to need to spend down assets.  Have discussed at length with TOC providers and TOC administration and discharge plan will need to change to discharge home with home health services with famil

## 2021-04-27 DIAGNOSIS — R509 Fever, unspecified: Secondary | ICD-10-CM

## 2021-04-27 LAB — BASIC METABOLIC PANEL
Anion gap: 3 — ABNORMAL LOW (ref 5–15)
Anion gap: 8 (ref 5–15)
BUN: 50 mg/dL — ABNORMAL HIGH (ref 6–20)
BUN: 55 mg/dL — ABNORMAL HIGH (ref 6–20)
CO2: 27 mmol/L (ref 22–32)
CO2: 26 mmol/L (ref 22–32)
Calcium: 9.6 mg/dL (ref 8.9–10.3)
Calcium: 9.6 mg/dL (ref 8.9–10.3)
Chloride: 119 mmol/L — ABNORMAL HIGH (ref 98–111)
Chloride: 115 mmol/L — ABNORMAL HIGH (ref 98–111)
Creatinine, Ser: 1.19 mg/dL (ref 0.61–1.24)
Creatinine, Ser: 1.2 mg/dL (ref 0.61–1.24)
GFR, Estimated: >60 mL/min (ref >60)
GFR, Estimated: >60 mL/min (ref >60)
Glucose, Bld: 112 mg/dL — ABNORMAL HIGH (ref 70–99)
Glucose, Bld: 122 mg/dL — ABNORMAL HIGH (ref 70–99)
Potassium: 3.7 mmol/L (ref 3.5–5.1)
Potassium: 3.9 mmol/L (ref 3.5–5.1)
Sodium: 149 mmol/L — ABNORMAL HIGH (ref 135–145)
Sodium: 149 mmol/L — ABNORMAL HIGH (ref 135–145)

## 2021-04-27 LAB — CBC
HCT: 26.6 % — ABNORMAL LOW (ref 39.0–52.0)
Hemoglobin: 8.1 g/dL — ABNORMAL LOW (ref 13.0–17.0)
MCH: 23.9 pg — ABNORMAL LOW (ref 26.0–34.0)
MCHC: 30.5 g/dL (ref 30.0–36.0)
MCV: 78.5 fL — ABNORMAL LOW (ref 80.0–100.0)
Platelets: 224 10*3/uL (ref 150–400)
RBC: 3.39 MIL/uL — ABNORMAL LOW (ref 4.22–5.81)
RDW: 17.1 % — ABNORMAL HIGH (ref 11.5–15.5)
WBC: 14.6 10*3/uL — ABNORMAL HIGH (ref 4.0–10.5)
nRBC: 0 % (ref 0.0–0.2)

## 2021-04-27 LAB — GLUCOSE, CAPILLARY
Glucose-Capillary: 118 mg/dL — ABNORMAL HIGH (ref 70–99)
Glucose-Capillary: 147 mg/dL — ABNORMAL HIGH (ref 70–99)
Glucose-Capillary: 116 mg/dL — ABNORMAL HIGH (ref 70–99)
Glucose-Capillary: 116 mg/dL — ABNORMAL HIGH (ref 70–99)
Glucose-Capillary: 148 mg/dL — ABNORMAL HIGH (ref 70–99)
Glucose-Capillary: 130 mg/dL — ABNORMAL HIGH (ref 70–99)

## 2021-04-27 LAB — URINALYSIS, ROUTINE W REFLEX MICROSCOPIC
Bilirubin Urine: NEGATIVE
Glucose, UA: NEGATIVE mg/dL
Ketones, ur: NEGATIVE mg/dL
Nitrite: NEGATIVE
Protein, ur: 30 mg/dL — AB
RBC / HPF: >50 RBC/hpf — ABNORMAL HIGH (ref 0–5)
Specific Gravity, Urine: 1.011 (ref 1.005–1.030)
WBC, UA: >50 WBC/hpf — ABNORMAL HIGH (ref 0–5)
pH: 7 (ref 5.0–8.0)

## 2021-04-27 LAB — MAGNESIUM: Magnesium: 2.6 mg/dL — ABNORMAL HIGH (ref 1.7–2.4)

## 2021-04-27 LAB — PHOSPHORUS: Phosphorus: 3.6 mg/dL (ref 2.5–4.6)

## 2021-04-27 MED ORDER — SODIUM CHLORIDE 0.9 % IV SOLN
1.0000 g | INTRAVENOUS | Status: DC
  Administered 2021-04-27: 1 g via INTRAVENOUS
  Filled 2021-04-27: qty 10

## 2021-04-27 MED ORDER — GLIPIZIDE 5 MG PO TABS
2.5000 mg | ORAL_TABLET | Freq: Every day | ORAL | Status: DC
  Administered 2021-04-27 – 2021-04-29 (×3): 2.5 mg
  Filled 2021-04-27 (×3): qty 1

## 2021-04-27 MED ORDER — FREE WATER
300.0000 mL | Status: DC
  Administered 2021-04-27 – 2021-04-29 (×11): 300 mL

## 2021-04-27 MED ORDER — FREE WATER: 350.0000 mL | Status: DC

## 2021-04-27 MED ORDER — FREE WATER
300.0000 mL | Status: DC
  Administered 2021-04-27 (×2): 300 mL

## 2021-04-27 MED ORDER — DEXTROSE 5 % IV SOLN: INTRAVENOUS | Status: DC

## 2021-04-27 NOTE — Assessment & Plan Note (Addendum)
3/28 Urine cx positve for > 100,000 colonies of Enterobacter -changed to cefepime 3/28 due to positive sputum (serratia)-continue for 7 days today is D#3 ?

## 2021-04-27 NOTE — Progress Notes (Signed)
Sputum obtained and sent to lab

## 2021-04-27 NOTE — Progress Notes (Signed)
Nutrition Follow-up ? ?DOCUMENTATION CODES:  ?Severe malnutrition in context of chronic illness ? ?INTERVENTION:  ?Obtain weights weekly  ? ?Continue TF via PEG: ?-Glucerna 1.5 @ goal rate of 14m/hr (14417mday)  ?-Free water per MD/NP, currently 30081mree water Q4H ? ?At goal, TF regimen will provide 2160 kcals, 118 grams protein, 1092m93mee water (2892ml55mal free water with flushes) ? ?Recommend continue 1 packet Juven BID, each packet provides 95 calories, 2.5 grams of protein (collagen), and 9.8 grams of carbohydrate (3 grams sugar); also contains 7 grams of L-arginine and L-glutamine, 300 mg vitamin C, 15 mg vitamin E, 1.2 mcg vitamin B-12, 9.5 mg zinc, 200 mg calcium, and 1.5 g  Calcium Beta-hydroxy-Beta-methylbutyrate to support wound healing ? ?NUTRITION DIAGNOSIS:  ?Severe Malnutrition related to chronic illness as evidenced by severe muscle depletion, severe fat depletion. -- ongoing ? ?GOAL:  ?Patient will meet greater than or equal to 90% of their needs -- addressing with TF, met when TF at goal ? ?MONITOR:  ?TF tolerance, Labs, I & O's, Skin ? ?REASON FOR ASSESSMENT:  ?Ventilator, Consult ?Enteral/tube feeding initiation and management ? ?ASSESSMENT:  ?55 ye64 old male who presented to the ED on 11/29 with AMS. PMH of HTN, T2DM, CKD stage III. 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 ? ?11/30 cortrak placed; tip gastric ?12/2 trickle TF ?12/3 pt intubated due to increased WOB, +emesis, TF held ?12/4 trickle TF restarted ?12/5 advancement orders placed ?12/9 TF adjusted ?12/13 s/p tracheostomy ?12/16 s/p EGD and PEG placement ?12/19 tx out of ICU to TRH  [facility]/23 developed Cheyne-Stokes respiratory pattern with respiratory alkalosis.Transferred to ICU for short-term mechanical ventilation and was found to have new tracheobronchitis now on cefepime ?12/25 tx back to TRH ?[name] trach changed to #6 shiley cuffless  ?1/10 marks 30 days since trach  placed ?2/17 trach changed   ?3/03 trach changed  ?3/04 SBO resolved ? ?Pt continues to be minimally responsive, tolerating ATC and continues to be NPO requiring TF via PEG. Family is working to obtain Medicaid so pt can be transitioned to SNF.  ? ?Current TF: Glucerna 1.5 @ 60ml/16m/ 200ml f15mwater Q4H ?  ?UOP: 1875ml x248murs ?I/O: +7331ml sin76mdmit ? ?Admit wt 85.2 kg ?Current wt 108 kg (has not been updated since 3/13) ?  ?Medications: glucotrol, tradjenta, IV abx, chronulac, reglan, protonix, SSI Q4H, juven BID, miralax ?Labs: Na 149 (H), Mg 2.6 (H) ?CBGs: 116-147 x 24 hours ? ?Diet Order:   ?Diet Order   ? ?       ?  Diet NPO time specified  Diet effective midnight       ?  ? ?  ?  ? ?  ? ?EDUCATION NEEDS:  ?Not appropriate for education at this time ? ?Skin:  Skin Assessment: Reviewed RN Assessment ?Skin Integrity Issues:: Stage II ?Stage II: anus ? ?Last BM:  04/25/21 ? ?Height:  ?Ht Readings from Last 1 Encounters:  ?01/23/21 6' (1.829 m)  ? ?Weight:  ?Wt Readings from Last 1 Encounters:  ?04/13/21 108 kg  ? ?BMI:  Body mass index is 32.28 kg/m?. ? ?Estimated Nutritional Needs:  ?Kcal:  2150-2350 ?Protein:  105-120 grams ?Fluid:  >2L ? ? ?Stephen Delgado A.[name], LDN (she/her/hers) ?RD pager number and weekend/on-call pager number located in Amion. ? [place]

## 2021-04-27 NOTE — Progress Notes (Signed)
Changed patients inner cannula.  He continues to have thick secretions and requires it to be changed frequently as well as suctioning.  Rt will continue to monitor.  ?

## 2021-04-27 NOTE — TOC Progression Note (Signed)
Transition of Care (TOC) - Progression Note  ? ? ?Patient Details  ?Name: Stephen Delgado ?MRN: [id number] ?Date of Birth: 1965/10/03 ? ?Transition of Care (TOC) CM/SW Contact  ?[name], RN ?Phone Number: ?04/27/2021, 1:09 PM ? ?Clinical Narrative:    ?CM called and left a message with the patient's brother, Roxy Horseman to follow up regarding family's ability to arrange time at the bedside for trach and PEG teaching for home.  I was unable to reach him by phone but left a message for follow up.  I called spoke with Tyson Dense, [facility] Tracheostomy Team and he states that insurance is still pending for authorization for private duty nursing at this time but he will follow up with the patient's brother to discuss home private duty and answer questions regarding this matter. ? ? ?Expected Discharge Plan: Home w Home Health Services ?Barriers to Discharge: Continued Medical Work up, No SNF bed ? ?Expected Discharge Plan and Services ?Expected Discharge Plan: Home w Home Health Services ?In-house Referral: Clinical Social Work, Hospice / Palliative Care, PCP / Health Connect ?Discharge Planning Services: CM Consult ?Post Acute Care Choice: Home Health ?Living arrangements for the past 2 months: Single Family Home ?                ?DME Arranged: Hospital bed, Trach supplies, Tube feeding, Oxygen ?DME Agency: [employer] ?Date DME Agency Contacted: 03/18/21 ?Time DME Agency Contacted: 1000 ?Representative spoke with at DME Agency: Jenness Corner, CM with [employer] ?  ?  ?  ?  ?  ? ? ?Social Determinants of Health (SDOH) Interventions ?  ? ?Readmission Risk Interventions ? ?  02/20/2021  ?  9:31 AM  ?Readmission Risk Prevention Plan  ?Transportation Screening Complete  ?Medication Review Oceanographer) Complete  ?PCP or Specialist appointment within 3-5 days of discharge Not Complete  ?PCP/Specialist Appt Not Complete comments Patient will need LTC placement at SNF facility.  ?[place] or Home Care Consult Complete  ?SW  Recovery Care/Counseling Consult Complete  ?Palliative Care Screening Complete  ?Skilled Nursing Facility Complete  ? ? ?

## 2021-04-27 NOTE — Progress Notes (Addendum)
? ?Progress Note ? ? ?Patient: Stephen Delgado [id number] DOB: [birth year] DOA: 12/30/2020     118 ?DOS: the patient was seen and examined on 04/27/2021 ? ? ? ?  ?Brief hospital course: ?56 y.o. male with PMH significant for DM2, HTN, CKD with questionable compliance to medications ?Patient presented to the ED on 12/30/2020 with complaint of headache, altered mental status ?  ?In the ED, he was hypertensive to 238/160, agitated, required restraints ?CT head showed large amount of intraventricular hemorrhage involving lateral/third/fourth ventricle, ICH and left basal ganglia as well as chronic microvascular ischemic changes. ?He was started on Cleviprex drip and admitted to neuro ICU ?  ?He subsequently had CT head repeated on 11/30, 12/1 and 12/3, all demonstrated unchanged intraparenchymal hemorrhage and intraventricular hemorrhage ?12/3, MRI brain from also showed numerous small acute infarcts in both cerebral hemisphere with minimal improvement in the right superior cerebellum ?12/3, intubated ?12/13, tracheostomy ?12/15, wean from vent to trach collar ?12/16, PEG tube placement ?12/16, repeat MRI showed an increase in the size and number of acute infarcts in the bilateral hemispheres with increased involvement in the cerebellum bilaterally.   ?12/18, transferred from neuro service to [facility]  ?12/21, palliative care consulted.  Family wanted to continue aggressive care as full CODE STATUS. ?12/23 developed Cheyne-Stokes respiratory pattern with respiratory alkalosis.  Transferred to ICU for short-term mechanical ventilation and was found to have new tracheobronchitis now on cefepime ?12/25 back to progressive floor and to TRH ?  ?His hospital course has been complicated by significant neurological impairment, aspiration events, AKI, hypernatremia, urinary retention. ?1/9 trach changed to #6.0 Shiley cuffless--1/10 > 30 days since trach inserted. ? 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  Staff educating family regarding the care of this patient including suctioning, administration of medications and tube feedings ? ? ? ? ? ?A physical therapy consult is indicated based on the patient?s mobility assessment. ? ? ?Mobility Assessment (last 72 hours)   ? ? Mobility Assessment   ? ? [place] Name 03/29/21 2000 03/29/21 1800 03/29/21 1400 03/29/21 1200 03/29/21 0800  ? Does patient have an order for bedrest or is patient medically unstable Yes- Bedfast (Level 1) - Complete Yes- Bedfast (Level 1) - Complete Yes- Bedfast (Level 1) - Complete Yes- Bedfast (Level 1) - Complete Yes- Bedfast (Level 1) - Complete  ? What is the highest level of mobility based on the progressive mobility assessment? Level 1 (Bedfast) - Unable to balance while sitting on edge of bed -- Level 1 (Bedfast) - Unable to balance while sitting on edge of bed Level 1 (Bedfast) - Unable to balance while sitting on edge of bed Level 1 (Bedfast) - Unable to balance while sitting on edge of bed  ? Is the above level different from baseline mobility prior to current illness? No - Consider discontinuing PT/OT -- Yes - Recommend PT order Yes - Recommend PT order Yes - Recommend PT order  ? ? [place] Name 03/28/21 2000 03/28/21 0800  ?  ?  ?  ? Does patient have an order for bedrest or is patient medically unstable Yes- Bedfast (Level 1) - Complete Yes- Bedfast (Level 1) - Complete     ? What is the highest level of mobility based on the progressive mobility assessment? Level 1 (Bedfast) - Unable to  balance while sitting on edge of bed Level 1 (Bedfast) - Unable to balance while sitting on edge of bed     ? Is the above level different from baseline mobility prior to current illness? No - Consider discontinuing PT/OT No - Consider  discontinuing PT/OT     ? ?  ?  ? ?  ? ? ? ?Acute otitis media, right with mastoid effusion ?Patient with recent bilateral conjunctivitis that persisted over 2 weeks despite pharmacological therapy ?Maxillofacial CT revealed new right middle ear and mastoid opacification consistent with otitis media with mastoid effusion ?Will need follow-up CT to evaluate for resolution.  If not resolved consideration should be given to ENT evaluation to determine if a candidate for myringotomy tube --Right TM unremarkable and without signs of infection when examined ? ? ?SBO ruled out/constipation-resolved as of 04/14/2021 ?Developed emesis and abdominal distention on the afternoon of 3/9 ?Symptoms worsen.  KUB revealed SBO but CT revealed obstipation ?Symptoms resolved with resumption of laxatives ? ? ? ? ?Fever ?3/25 TM 100.1 ?3/27 WBC 14.6 ?Ck UA/cx, tracheal asp and bld cxs ? ?@@ UA appears c/w UTI (turbid appearance, moderate hemoglobin, large leukocytes, 30 of protein, many bacteria, greater than 50 RBC, greater than 50 WBC)-we will begin IV Rocephin for total of 5 days-to follow-up on urine culture in the event Rocephin can be narrowed or a broader spectrum antibiotic will need to be initiated; of note patient did not have a Foley catheter in place ? 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 ? ?Malignant hypertension ?Blood pressure stable on Norvasc Coreg, hydralazine, and clonidine ? ? ?Diabetes mellitus type 2 in obese (HCC) ?-A1c 6.3 in 12/31/2020 ?-Continue SSI every 4 hours ?-tradjenta  ?-CBGs well controlled but still requiring  supplemental SSI so we will add low-dose Glucotrol ? ? ? ?  ? ?hypernatremia ?Increase free water flushes and follow ?3/27 NA STILL ELEVATED AT 149-WILL FOLLOW- MAY NEED TO ADD ST DEXTROSE IVF ? ? ?Acute conjunctivitis, bilateral ?Resolved ? ?Pneumonia due to Klebsiella pneumoniae (HCC)-resolved as of 04/01/2021 ?Resolved ? ?Protein-calorie malnutrition/dysphagia due to recent stroke/constipation ?Nutrition Problem: Inadequate oral intake ?Etiology: lethargy/confusion, dysphagia ?Signs/Symptoms: NPO status ?Interventions: Tube feeding via PEG ?Body mass index is 23.33 kg/m?Marland Kitchen  ?Continue Glucerna ? ?  ? ?Acute urinary retention-resolved as of 03/30/2021 ?-Continue Urecholine ?-Foley catheter discontinued on 1/11 and patient voiding easily ? ?Transaminitis ?-2/2 Zyprexa ?-Resolved ? ?MSSA (methicillin susceptible Staphylococcus aureus) pneumonia (HCC)-resolved as of 04/02/2021 ?Resolved ? ?Pressure injury of skin-resolved as of 04/02/2021 ?Resolved-prior anus ulcer ? ?Pneumonia due to Serratia marcescens (HCC)-resolved as of 04/01/2021 ?Resolved ? ?Acute kidney injury on CKD 3b/acute azotemia with mild hypernatremia ?Hypernatremia worsening, increase free water to 300 q4 and follow ? ?Anemia ?Hb downtrending, follow  ?No evidence bleeding, labs c/w AOCD and iron def ? ?Constipation ?Had additional episode of constipation that appeared to be consistent with SBO on plain films but CT confirmed significant colonic stool retention ?Laxatives resumed ? ? ? ? ? ? ? ?Subjective:  ?Remains unresponsive to both verbal and tactile stimulation ? ?Physical Exam: ?Vitals:  ? 04/27/21 0321 04/27/21 0735 04/27/21 1127 04/27/21 1159  ?BP: 125/71   124/64  ?  Pulse: 81 89 86 85  ?Resp:  18 18 18   ?Temp: 98.7 ?F (37.1 ?C)   98.2 ?F (36.8 ?C)  ?TempSrc: Oral     ?SpO2: 97% 96% 95% 98%  ?Weight:      ?Height:      ? ?Constitutional: NAD, opens eyes to voice today but ?Respiratory: #6.0 Shiley cuffless trach, room air- lung sounds CTA  bilaterally---normal respiratory pattern  ?Cardiovascular: S1-S2, NSR, normotensive -no bilateral lower extremity edema  ?Abdomen:  Abdomen soft and nontender but does appear to be somewhat distended but not significantly.  Bowel

## 2021-04-27 NOTE — Progress Notes (Signed)
? ?NAME:  Stephen Delgado, MRN:  [id number], DOB:  [birth year], LOS: 118 ?ADMISSION DATE:  12/30/2020, CONSULTATION DATE:  12/19 ?REFERRING MD:  Erlinda Hong, CHIEF COMPLAINT:  Dyspnea  ? ?History of Present Illness:  ?56 yo male presented with headache, nausea and altered mental status.  CT head showed acute Lt ICH with IVH likely related to hypertensive emergency (BP 195/116). Had persistent hypertension and concern for airway protection, and PCCM asked to assist with ICU management.  Tracheostomy 12/13.  Moved out of ICU.  ? ?Pertinent  Medical History  ?HTN, DM type 2, CKD 3a, OSA ? ?Significant Hospital Events: ?Including procedures, antibiotic start and stop dates in addition to other pertinent events   ?11/29 Admit, start cleviprex ?12/01 PCCM consulted; changed from cleviprex to cardene due to elevated triglycerides ?12/03 intubated; episode of vomiting >> tube feeds held ?12/04 resume trickle tube feeds ?12/06 remains minimally responsive. Tolerating SBT.  ?12/12 no acute events overnight, T-max 102 point ?12/13 bedside trach planned midmorning  ?12/14 ATC. Lasix --> 5L UOP  ?12/15 cont on trach collar. Na to 154 from 145. Adding low rate d5  ?12/16 scheduled for PEG. Cont D5. Continues on trach collar ?Out of ICU 12/19 ?12/23 Cheyne-Stokes respirations noted and was transferred to ICU for short-term ventilation.  ?12/25 Returned to floor ?1/9 Trach changed to 6 cuffless  ?2/6 Trach follow up, no major changes, remains on ATC  ?2/27 trach f/u, on TC, seems to attend briefly, otherwise poor neuro exam ?04/06/2021 Trach FU, On ATC at 28%, no change in MS, neuro exam ? ?Imaging/Studies: ?CT head 11/29 >> large amount of IVH involving lateral/3rd/4th ventricles, ICH in Lt BG, chronic microvascular ischemic changes ?Echo 11/30 >> EF 50 to 55%, mod LVH, grade 1 DD, ascending aorta 37 mm ?EEG 12/01 >> continuous generalized slowing ?MRI brain 12/03 >> numerous small acute infarcts in b/l cerebral hemispheres, Lt caudate  hemorrhage, IVH, scattered sulcal SAH, mild communicating hydrocephalus ?CT head 12/23 Expected evolution of strokes, no shift or herniation ? ? ? ?Interim History / Subjective:  ? ?Continues on trach collar. No new changes ? ?Objective   ?Blood pressure 124/64, pulse 85, temperature 98.2 ?F (36.8 ?C), resp. rate 18, height 6' (1.829 m), weight 108 kg, SpO2 98 %. ?   ?FiO2 (%):  [21 %] 21 %  ? ?Intake/Output Summary (Last 24 hours) at 04/27/2021 1215 ?Last data filed at 04/27/2021 1100 ?Gross per 24 hour  ?Intake 3042 ml  ?Output 2375 ml  ?Net 667 ml  ? ?Filed Weights  ? 03/15/21 0509 03/16/21 0500 04/13/21 1700  ?Weight: 83.9 kg 83.5 kg 108 kg  ? ?Physical exam  ?Gen:      No acute distress ?HEENT:  EOMI, sclera anicteric ?Neck:     No masses; no thyromegaly, #6 cuffless trach ?Lungs:    Clear to auscultation bilaterally; normal respiratory effort ?CV:         Regular rate and rhythm; no murmurs ?Abd:      + bowel sounds; soft, non-tender; no palpable masses, no distension ?Ext:    No edema; adequate peripheral perfusion ?Skin:      Warm and dry; no rash ?Neuro: Open eyes, Does not follow commands ? ?Resolved Hospital Problem list   ? ? ?Assessment & Plan:  ? 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  DM type II  ?HTN  ? 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 ? ?Marshell Garfinkel MD ?[facility] ?See Amion for pager ? ?If no response to pager , please call [phone] until 7pm ?After 7:00 pm call Elink  [phone] ?04/27/2021, 12:15 PM  ? ? ? ? ? ? ? ? ?

## 2021-04-28 ENCOUNTER — Inpatient Hospital Stay (HOSPITAL_COMMUNITY): Payer: 59

## 2021-04-28 LAB — CBC WITH DIFFERENTIAL/PLATELET
Abs Immature Granulocytes: 0.08 10*3/uL — ABNORMAL HIGH (ref 0.00–0.07)
Basophils Absolute: 0 10*3/uL (ref 0.0–0.1)
Basophils Relative: 0 %
Eosinophils Absolute: 0.4 10*3/uL (ref 0.0–0.5)
Eosinophils Relative: 3 %
HCT: 28.7 % — ABNORMAL LOW (ref 39.0–52.0)
Hemoglobin: 8.6 g/dL — ABNORMAL LOW (ref 13.0–17.0)
Immature Granulocytes: 1 %
Lymphocytes Relative: 13 %
Lymphs Abs: 1.7 10*3/uL (ref 0.7–4.0)
MCH: 23.8 pg — ABNORMAL LOW (ref 26.0–34.0)
MCHC: 30 g/dL (ref 30.0–36.0)
MCV: 79.5 fL — ABNORMAL LOW (ref 80.0–100.0)
Monocytes Absolute: 1.2 10*3/uL — ABNORMAL HIGH (ref 0.1–1.0)
Monocytes Relative: 9 %
Neutro Abs: 9.6 10*3/uL — ABNORMAL HIGH (ref 1.7–7.7)
Neutrophils Relative %: 74 %
Platelets: 221 10*3/uL (ref 150–400)
RBC: 3.61 MIL/uL — ABNORMAL LOW (ref 4.22–5.81)
RDW: 17.2 % — ABNORMAL HIGH (ref 11.5–15.5)
WBC: 13 10*3/uL — ABNORMAL HIGH (ref 4.0–10.5)
nRBC: 0 % (ref 0.0–0.2)

## 2021-04-28 LAB — GLUCOSE, CAPILLARY
Glucose-Capillary: 136 mg/dL — ABNORMAL HIGH (ref 70–99)
Glucose-Capillary: 117 mg/dL — ABNORMAL HIGH (ref 70–99)
Glucose-Capillary: 99 mg/dL (ref 70–99)
Glucose-Capillary: 138 mg/dL — ABNORMAL HIGH (ref 70–99)
Glucose-Capillary: 116 mg/dL — ABNORMAL HIGH (ref 70–99)
Glucose-Capillary: 129 mg/dL — ABNORMAL HIGH (ref 70–99)

## 2021-04-28 LAB — BASIC METABOLIC PANEL
Anion gap: 10 (ref 5–15)
Anion gap: 10 (ref 5–15)
BUN: 50 mg/dL — ABNORMAL HIGH (ref 6–20)
BUN: 56 mg/dL — ABNORMAL HIGH (ref 6–20)
CO2: 24 mmol/L (ref 22–32)
CO2: 25 mmol/L (ref 22–32)
Calcium: 9.5 mg/dL (ref 8.9–10.3)
Calcium: 9.6 mg/dL (ref 8.9–10.3)
Chloride: 114 mmol/L — ABNORMAL HIGH (ref 98–111)
Chloride: 111 mmol/L (ref 98–111)
Creatinine, Ser: 1.19 mg/dL (ref 0.61–1.24)
Creatinine, Ser: 1.23 mg/dL (ref 0.61–1.24)
GFR, Estimated: >60 mL/min (ref >60)
GFR, Estimated: >60 mL/min (ref >60)
Glucose, Bld: 125 mg/dL — ABNORMAL HIGH (ref 70–99)
Glucose, Bld: 128 mg/dL — ABNORMAL HIGH (ref 70–99)
Potassium: 3.8 mmol/L (ref 3.5–5.1)
Potassium: 4.3 mmol/L (ref 3.5–5.1)
Sodium: 148 mmol/L — ABNORMAL HIGH (ref 135–145)
Sodium: 146 mmol/L — ABNORMAL HIGH (ref 135–145)

## 2021-04-28 LAB — MRSA NEXT GEN BY PCR, NASAL: MRSA by PCR Next Gen: NOT DETECTED

## 2021-04-28 MED ORDER — SODIUM CHLORIDE 0.9 % IV SOLN
2.0000 g | Freq: Three times a day (TID) | INTRAVENOUS | Status: AC
  Administered 2021-04-28 – 2021-05-05 (×21): 2 g via INTRAVENOUS
  Filled 2021-04-28 (×21): qty 2

## 2021-04-28 NOTE — Plan of Care (Signed)
?  Problem: Clinical Measurements: ?Goal: Diagnostic test results will improve ?Outcome: Progressing ?Goal: Respiratory complications will improve ?Outcome: Progressing ?Goal: Cardiovascular complication will be avoided ?Outcome: Progressing ?  ?Problem: Nutrition: ?Goal: Adequate nutrition will be maintained ?Outcome: Progressing ?  ?Problem: Coping: ?Goal: Level of anxiety will decrease ?Outcome: Progressing ?  ?Problem: Elimination: ?Goal: Will not experience complications related to bowel motility ?Outcome: Progressing ?Goal: Will not experience complications related to urinary retention ?Outcome: Progressing ?  ?Problem: Pain Managment: ?Goal: General experience of comfort will improve ?Outcome: Progressing ?  ?Problem: Safety: ?Goal: Ability to remain free from injury will improve ?Outcome: Progressing ?  ?Problem: Skin Integrity: ?Goal: Risk for impaired skin integrity will decrease ?Outcome: Progressing ?  ?Problem: Nutrition: ?Goal: Risk of aspiration will decrease ?Outcome: Progressing ?Goal: Dietary intake will improve ?Outcome: Progressing ?  ?Problem: Intracerebral Hemorrhage Tissue Perfusion: ?Goal: Complications of Intracerebral Hemorrhage will be minimized ?Outcome: Progressing ?  ?

## 2021-04-28 NOTE — TOC Progression Note (Addendum)
Transition of Care (TOC) - Progression Note  ? ? ?Patient Details  ?Name: Stephen Delgado ?MRN: [id number] ?Date of Birth: 07-09-1965 ? ?Transition of Care (TOC) CM/SW Contact  ?Curlene Labrum, RN ?Phone Number: ?04/28/2021, 11:23 AM ? ?Clinical Narrative:    ?CM met with the patient's wife, [name] Ibbotson, at the bedside to discuss [facility] plans for home with private duty at a pending later date - once [employer] duty can be authorized for private duty at the bedside through [place].  [name] was participating with patient care duties at the bedside with the nursing tech present  - to include bath and skin care.  [name] asked questions regarding needed equipment for the home through [place] and I updated her and offered education regarding this matter.  I requested that the wife should consider taking FMLA when the patient is able to discharge to the home at a later date. The patient has fever and increased WBC's at this time relating to urine and pulmonary secretions/ increased work of breathing and is not medically stable for discharge at this time per Lissa Merlin, NP and Florene Glen, MD.  [facility] staff and bedside nursing will continue to support the family with educations and preparation for discharge to home with home health services at a later date. ? ?04/28/2021 1135 - CM called and spoke with Nonah Mattes, [place] with [employer] and their offer for services are on hold at this time while waiting for insurance authorization through [employer] duty nursing.  [place] care would need an LOG to provide care at this time. ? ?CM and MSW with DTP Team will continue to follow the patient for discharge planning. ? ?04/28/2021 1333 - CM asked Utilization Review RN to follow up with insurance provider since the patient is not medically stable at this time due to fever and signs/symptoms of infection. ? ? ?Expected Discharge Plan: [place] ?Barriers to Discharge: Continued  Medical Work up, No SNF bed ? ?Expected Discharge Plan and Services ?Expected Discharge Plan: [place] ?In-house Referral: Clinical Social Work, Hospice / [place], PCP / Health Connect ?Discharge Planning Services: CM Consult ?Post Acute Care Choice: Home Health ?Living arrangements for the past 2 months: [place] ?                ?DME Arranged: Hospital bed, Trach supplies, Tube feeding, Oxygen ?DME Agency: [employer] ?Date DME Agency Contacted: 03/18/21 ?Time DME Agency Contacted: 1000 ?Representative spoke with at DME Agency: Bethanne Ginger, CM with [employer] ?  ?  ?  ?  ?  ? ? ?Social Determinants of Health (SDOH) Interventions ?  ? ?Readmission Risk Interventions ? ?  02/20/2021  ?  9:31 AM  ?Readmission Risk Prevention Plan  ?Transportation Screening Complete  ?Medication Review Press photographer) Complete  ?PCP or Specialist appointment within 3-5 days of discharge Not Complete  ?PCP/Specialist Appt Not Complete comments Patient will need LTC placement at SNF facility.  ?[place] or Home Care Consult Complete  ?SW Recovery Care/Counseling Consult Complete  ?Palliative Care Screening Complete  ?Skilled Nursing Facility Complete  ? ? ?

## 2021-04-28 NOTE — Progress Notes (Signed)
Trach care done.  Changed inner cannula again. Trach secure and in place. ?

## 2021-04-28 NOTE — Plan of Care (Signed)
  Problem: Education: Goal: Knowledge of disease or condition will improve Outcome: Not Progressing   

## 2021-04-28 NOTE — Progress Notes (Signed)
Pharmacy Antibiotic Note ? ?Stephen Delgado is a 56 y.o. male admitted on 12/30/2020 with healthcare associated pneumonia.  Pharmacy has been consulted for cefepime dosing. ? ?Plan: ?Cefepime 2 grams iv q8h ? ?Height: 6' (182.9 cm) ?Weight: 111.1 kg (245 lb) ?IBW/kg (Calculated) : 77.6 ? ?Temp (24hrs), Avg:99.2 ?F (37.3 ?C), Min:98.9 ?F (37.2 ?C), Max:99.5 ?F (37.5 ?C) ? ?Recent Labs  ?Lab 04/25/21 ?0422 04/25/21 ?1426 04/26/21 ?4401 04/26/21 ?1543 04/27/21 ?0272 04/27/21 ?1533 04/28/21 ?0357  ?WBC 11.7*  --  11.3*  --  14.6*  --  13.0*  ?CREATININE 1.17   < > 1.22 1.25* 1.19 1.20 1.19  ? < > = values in this interval not displayed.  ?  ?Estimated Creatinine Clearance: 88.2 mL/min (by C-G formula based on SCr of 1.19 mg/dL).   ? ?No Known Allergies ? ?Antimicrobials this admission: ?3/27 CTX >>  ?3/28 cefepime >>  ? ? ?Thank you for allowing pharmacy to be a part of this patient?s care. ? ?Gloria Ricardo BS, PharmD, BCPS ?Clinical Pharmacist ?04/28/2021 3:03 PM ? ?

## 2021-04-28 NOTE — Progress Notes (Addendum)
? ?Progress Note ? ? ?Patient: Stephen Delgado DOB: Mar 08, 1965 DOA: 12/30/2020     119 ?DOS: the patient was seen and examined on 04/28/2021 ? ? ? ?  ?Brief hospital course: ?56 y.o. male with PMH significant for DM2, HTN, CKD with questionable compliance to medications ?Patient presented to the ED on 12/30/2020 with complaint of headache, altered mental status ?  ?In the ED, he was hypertensive to 238/160, agitated, required restraints ?CT head showed large amount of intraventricular hemorrhage involving lateral/third/fourth ventricle, ICH and left basal ganglia as well as chronic microvascular ischemic changes. ?He was started on Cleviprex drip and admitted to neuro ICU ?  ?He subsequently had CT head repeated on 11/30, 12/1 and 12/3, all demonstrated unchanged intraparenchymal hemorrhage and intraventricular hemorrhage ?12/3, MRI brain from also showed numerous small acute infarcts in both cerebral hemisphere with minimal improvement in the right superior cerebellum ?12/3, intubated ?12/13, tracheostomy ?12/15, wean from vent to trach collar ?12/16, PEG tube placement ?12/16, repeat MRI showed an increase in the size and number of acute infarcts in the bilateral hemispheres with increased involvement in the cerebellum bilaterally.   ?12/18, transferred from neuro service to [facility]  ?12/21, palliative care consulted.  Family wanted to continue aggressive care as full CODE STATUS. ?12/23 developed Cheyne-Stokes respiratory pattern with respiratory alkalosis.  Transferred to ICU for short-term mechanical ventilation and was found to have new tracheobronchitis now on cefepime ?12/25 back to progressive floor and to TRH ?  ?His hospital course has been complicated by significant neurological impairment, aspiration events, AKI, hypernatremia, urinary retention. ?1/9 trach changed to #6.0 Shiley cuffless--1/10 > 30 days since trach inserted. ? 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  Staff educating family regarding the care of this patient including suctioning, administration of medications and tube feedings ? ? ? ? ? ?A physical therapy consult is indicated based on the patient?s mobility assessment. ? ? ?Mobility Assessment (last 72 hours)   ? ? Mobility Assessment   ? ? [place] Name 03/29/21 2000 03/29/21 1800 03/29/21 1400 03/29/21 1200 03/29/21 0800  ? Does patient have an order for bedrest or is patient medically unstable Yes- Bedfast (Level 1) - Complete Yes- Bedfast (Level 1) - Complete Yes- Bedfast (Level 1) - Complete Yes- Bedfast (Level 1) - Complete Yes- Bedfast (Level 1) - Complete  ? What is the highest level of mobility based on the progressive mobility assessment? Level 1 (Bedfast) - Unable to balance while sitting on edge of bed -- Level 1 (Bedfast) - Unable to balance while sitting on edge of bed Level 1 (Bedfast) - Unable to balance while sitting on edge of bed Level 1 (Bedfast) - Unable to balance while sitting on edge of bed  ? Is the above level different from baseline mobility prior to current illness? No - Consider discontinuing PT/OT -- Yes - Recommend PT order Yes - Recommend PT order Yes - Recommend PT order  ? ? [place] Name 03/28/21 2000 03/28/21 0800  ?  ?  ?  ? Does patient have an order for bedrest or is patient medically unstable Yes- Bedfast (Level 1) - Complete Yes- Bedfast (Level 1) - Complete     ? What is the highest level of mobility based on the progressive mobility assessment? Level 1 (Bedfast) - Unable to  balance while sitting on edge of bed Level 1 (Bedfast) - Unable to balance while sitting on edge of bed     ? Is the above level different from baseline mobility prior to current illness? No - Consider discontinuing PT/OT No - Consider  discontinuing PT/OT     ? ?  ?  ? ?  ? ? ? ?Acute otitis media, right with mastoid effusion ?Patient with recent bilateral conjunctivitis that persisted over 2 weeks despite pharmacological therapy ?Maxillofacial CT revealed new right middle ear and mastoid opacification consistent with otitis media with mastoid effusion ?Will need follow-up CT to evaluate for resolution.  If not resolved consideration should be given to ENT evaluation to determine if a candidate for myringotomy tube --Right TM unremarkable and without signs of infection when examined ? ? ?SBO ruled out/constipation-resolved as of 04/14/2021 ?Developed emesis and abdominal distention on the afternoon of 3/9 ?Symptoms worsen.  KUB revealed SBO but CT revealed obstipation ?Symptoms resolved with resumption of laxatives ? ? ? ? ?Fever ?3/25 TM 100.1 ?3/27 WBC 14.6 ?Ck UA/cx, tracheal asp and bld cxs ? ?@@ 3/28 Urine cx positve for > 100,000 colonies of GNR's-cont empiric Rocephin D #2 ? 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 Tracheal secretions also very thick and tan in color- RRT has changed out inner cannula frequently and suction frequency has increased- sputum cx w/ moderate GNR's ?3/27 portable chest x-ray revealed possible opacities left lung base. ?3/28 Will dc Rocephin in favor of Cefepime. Will ck MRSA swab-if positive will need to start Vanco IV ? ? ?Malignant hypertension ?Blood pressure stable on Norvasc Coreg, hydralazine, and  clonidine ? ? ?Diabetes mellitus type 2 in obese (HCC) ?-A1c 6.3 in 12/31/2020 ?-Continue SSI every 4 hours ?-tradjenta  ?-CBGs well controlled but still requiring supplemental SSI so we will add low-dose Glucotrol ? ? ? ?  ? ?hypernatremia ?Continue free water flushes of 300 cc every 6 hours  ?3/27 D5W at low rate added for persistent hypernatremia ?Continue to follow lab ? ? ?Acute conjunctivitis, bilateral ?Resolved ? ?Pneumonia due to Klebsiella pneumoniae (HCC)-resolved as of 04/01/2021 ?Resolved ? ?Protein-calorie malnutrition/dysphagia due to recent stroke/constipation ?Nutrition Problem: Inadequate oral intake ?Etiology: lethargy/confusion, dysphagia ?Signs/Symptoms: NPO status ?Interventions: Tube feeding via PEG ?Body mass index is 23.33 kg/m?Marland Kitchen  ?Continue Glucerna ? ?  ? ?Acute urinary retention-resolved as of 03/30/2021 ?-Continue Urecholine ?-Foley catheter discontinued on 1/11 and patient voiding easily ? ?Transaminitis ?-2/2 Zyprexa ?-Resolved ? ?MSSA (methicillin susceptible Staphylococcus aureus) pneumonia (HCC)-resolved as of 04/02/2021 ?Resolved ? ?Pressure injury of skin-resolved as of 04/02/2021 ?Resolved-prior anus ulcer ? ?Pneumonia due to Serratia marcescens (HCC)-resolved as of 04/01/2021 ?Resolved ? ?Acute kidney injury on CKD 3b/acute azotemia with mild hypernatremia ?Hypernatremia worsening, increase free water to 300 q4 and follow ? ?Anemia ?Hb downtrending, follow  ?No evidence bleeding, labs c/w AOCD and iron def ? ?Constipation ?Had additional episode of constipation that appeared to be consistent with SBO on plain films but CT confirmed significant colonic stool retention ?Laxatives resumed ? ? ? ? ? ? ? ?Subjective:  ?Remains unresponsive to both verbal and  tactile stimulation-noted with Cheyne-Stokes respiratory pattern ? ?Physical Exam: ?Vitals:  ? 04/28/21 0754 04/28/21 0837 04/28/21 1158 04/28/21 1204  ?BP: 140/64  136/67   ?Pulse: 84 82 81 83  ?Resp: 20 20 20 20   ?Temp: 99 ?F (37.2 ?C)   99.3 ?F (37.4 ?C)   ?TempSrc: Axillary  Axillary   ?SpO2: 96% 96% 96% 97%  ?Weight:      ?Height:      ? ?Constitutional: NAD, eyes open but still not tracking ?Respiratory: #6.0 Shiley cuffless trach, room air- lung sounds coarse t

## 2021-04-29 ENCOUNTER — Inpatient Hospital Stay (HOSPITAL_COMMUNITY): Payer: 59

## 2021-04-29 DIAGNOSIS — N390 Urinary tract infection, site not specified: Secondary | ICD-10-CM

## 2021-04-29 DIAGNOSIS — A498 Other bacterial infections of unspecified site: Secondary | ICD-10-CM

## 2021-04-29 LAB — BASIC METABOLIC PANEL
Anion gap: 9 (ref 5–15)
BUN: 55 mg/dL — ABNORMAL HIGH (ref 6–20)
CO2: 25 mmol/L (ref 22–32)
Calcium: 9.6 mg/dL (ref 8.9–10.3)
Chloride: 113 mmol/L — ABNORMAL HIGH (ref 98–111)
Creatinine, Ser: 1.2 mg/dL (ref 0.61–1.24)
GFR, Estimated: >60 mL/min (ref >60)
Glucose, Bld: 129 mg/dL — ABNORMAL HIGH (ref 70–99)
Potassium: 3.8 mmol/L (ref 3.5–5.1)
Sodium: 147 mmol/L — ABNORMAL HIGH (ref 135–145)

## 2021-04-29 LAB — MAGNESIUM: Magnesium: 2.3 mg/dL (ref 1.7–2.4)

## 2021-04-29 LAB — GLUCOSE, CAPILLARY
Glucose-Capillary: 114 mg/dL — ABNORMAL HIGH (ref 70–99)
Glucose-Capillary: 117 mg/dL — ABNORMAL HIGH (ref 70–99)
Glucose-Capillary: 118 mg/dL — ABNORMAL HIGH (ref 70–99)
Glucose-Capillary: 122 mg/dL — ABNORMAL HIGH (ref 70–99)
Glucose-Capillary: 134 mg/dL — ABNORMAL HIGH (ref 70–99)
Glucose-Capillary: 95 mg/dL (ref 70–99)

## 2021-04-29 LAB — POCT I-STAT 7, (LYTES, BLD GAS, ICA,H+H)
Acid-Base Excess: 2 mmol/L (ref 0.0–2.0)
Bicarbonate: 25.9 mmol/L (ref 20.0–28.0)
Calcium, Ion: 1.31 mmol/L (ref 1.15–1.40)
HCT: 25 % — ABNORMAL LOW (ref 39.0–52.0)
Hemoglobin: 8.5 g/dL — ABNORMAL LOW (ref 13.0–17.0)
O2 Saturation: 94 %
Patient temperature: 99.3
Potassium: 3.7 mmol/L (ref 3.5–5.1)
Sodium: 152 mmol/L — ABNORMAL HIGH (ref 135–145)
TCO2: 27 mmol/L (ref 22–32)
pCO2 arterial: 36.2 mmHg (ref 32–48)
pH, Arterial: 7.464 — ABNORMAL HIGH (ref 7.35–7.45)
pO2, Arterial: 66 mmHg — ABNORMAL LOW (ref 83–108)

## 2021-04-29 LAB — URINE CULTURE: Culture: >=100000 — AB

## 2021-04-29 LAB — CBC WITH DIFFERENTIAL/PLATELET
Abs Immature Granulocytes: 0.18 10*3/uL — ABNORMAL HIGH (ref 0.00–0.07)
Basophils Absolute: 0.1 10*3/uL (ref 0.0–0.1)
Basophils Relative: 0 %
Eosinophils Absolute: 0.5 10*3/uL (ref 0.0–0.5)
Eosinophils Relative: 3 %
HCT: 26.7 % — ABNORMAL LOW (ref 39.0–52.0)
Hemoglobin: 8.2 g/dL — ABNORMAL LOW (ref 13.0–17.0)
Immature Granulocytes: 1 %
Lymphocytes Relative: 15 %
Lymphs Abs: 2.2 10*3/uL (ref 0.7–4.0)
MCH: 24 pg — ABNORMAL LOW (ref 26.0–34.0)
MCHC: 30.7 g/dL (ref 30.0–36.0)
MCV: 78.1 fL — ABNORMAL LOW (ref 80.0–100.0)
Monocytes Absolute: 1.3 10*3/uL — ABNORMAL HIGH (ref 0.1–1.0)
Monocytes Relative: 9 %
Neutro Abs: 10.5 10*3/uL — ABNORMAL HIGH (ref 1.7–7.7)
Neutrophils Relative %: 72 %
Platelets: 218 10*3/uL (ref 150–400)
RBC: 3.42 MIL/uL — ABNORMAL LOW (ref 4.22–5.81)
RDW: 17.1 % — ABNORMAL HIGH (ref 11.5–15.5)
WBC: 14.7 10*3/uL — ABNORMAL HIGH (ref 4.0–10.5)
nRBC: 0 % (ref 0.0–0.2)

## 2021-04-29 LAB — PHOSPHORUS: Phosphorus: 3.3 mg/dL (ref 2.5–4.6)

## 2021-04-29 MED ORDER — METOCLOPRAMIDE HCL 10 MG PO TABS
5.0000 mg | ORAL_TABLET | Freq: Four times a day (QID) | ORAL | Status: DC
  Administered 2021-04-29 – 2021-04-30 (×3): 5 mg
  Filled 2021-04-29 (×3): qty 1

## 2021-04-29 MED ORDER — DEXTROSE-NACL 5-0.45 % IV SOLN: INTRAVENOUS | Status: AC

## 2021-04-29 MED ORDER — ACETYLCYSTEINE 20 % IN SOLN
4.0000 mL | RESPIRATORY_TRACT | Status: DC
  Administered 2021-04-30 – 2021-05-01 (×6): 4 mL via RESPIRATORY_TRACT
  Filled 2021-04-29 (×19): qty 4

## 2021-04-29 MED ORDER — GUAIFENESIN 100 MG/5ML PO LIQD
5.0000 mL | ORAL | Status: DC
  Administered 2021-04-29 – 2021-04-30 (×4): 5 mL
  Filled 2021-04-29 (×5): qty 5

## 2021-04-29 NOTE — Progress Notes (Signed)
Patient seen and examined.  Case discussed with Erin Hearing, NP.  Please refer to her notes for further details on plan of care and hospital course ? ?In brief, this is an unfortunate 56 year old male who was admitted to the hospital on 12/30/2020 with complaints of headache and altered mental status.  He was found to have intracranial hemorrhage on CT imaging and was admitted to the neuro ICU on a Cleviprex drip.  Further imaging with MRI brain showed bilateral numerous small acute infarcts.  Subsequently had deterioration in mental status and was intubated, now has a tracheostomy.  He is also PEG tube dependent ? ?Informed by staff that he appeared to be more short of breath today.  He is required frequent suctioning.  He was started on hypotonic fluids for hypernatremia.  Also started on Mucomyst nebs and continued supportive pulmonary care.  Chest x-ray repeated today actually showed improvement of lung aeration.  Upon my visit, patient appeared unresponsive.  He opened his eyes to voice, but did not make eye contact or engage in conversation.  Did not appear to be in respiratory distress.  He was not diaphoretic.  After my visit, informed by staff that he had an episode of vomiting.  Tube feedings were discontinued.  Abdominal x-ray was requested.  He does have a history of constipation/ileus.  Continue to hold tube feedings for now.  Continue supportive pulmonary care. ? ?Stephen Delgado ? ?

## 2021-04-29 NOTE — Plan of Care (Signed)
?  Problem: Elimination: ?Goal: Will not experience complications related to urinary retention ?Outcome: Progressing ?  ?

## 2021-04-29 NOTE — Progress Notes (Addendum)
? ?Progress Note ? ? ?Patient: Angeline SlimJerry N Kiesler ZOX:[id number] DOB: 1965/12/11 DOA: 12/30/2020     120 ?DOS: the patient was seen and examined on 04/29/2021 ? ? ? ?  ?Brief hospital course: ?56 y.o. male with PMH significant for DM2, HTN, CKD with questionable compliance to medications ?Patient presented to the ED on 12/30/2020 with complaint of headache, altered mental status ?  ?In the ED, he was hypertensive to 238/160, agitated, required restraints ?CT head showed large amount of intraventricular hemorrhage involving lateral/third/fourth ventricle, ICH and left basal ganglia as well as chronic microvascular ischemic changes. ?He was started on Cleviprex drip and admitted to neuro ICU ?  ?He subsequently had CT head repeated on 11/30, 12/1 and 12/3, all demonstrated unchanged intraparenchymal hemorrhage and intraventricular hemorrhage ?12/3, MRI brain from also showed numerous small acute infarcts in both cerebral hemisphere with minimal improvement in the right superior cerebellum ?12/3, intubated ?12/13, tracheostomy ?12/15, wean from vent to trach collar ?12/16, PEG tube placement ?12/16, repeat MRI showed an increase in the size and number of acute infarcts in the bilateral hemispheres with increased involvement in the cerebellum bilaterally.   ?12/18, transferred from neuro service to [facility]  ?12/21, palliative care consulted.  Family wanted to continue aggressive care as full CODE STATUS. ?12/23 developed Cheyne-Stokes respiratory pattern with respiratory alkalosis.  Transferred to ICU for short-term mechanical ventilation and was found to have new tracheobronchitis now on cefepime ?12/25 back to progressive floor and to TRH ?  ?His hospital course has been complicated by significant neurological impairment, aspiration events, AKI, hypernatremia, urinary retention. ?1/9 trach changed to #6.0 Shiley cuffless--1/10 > 30 days since trach inserted. ? 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  Staff educating family regarding the care of this patient including suctioning, administration of medications and tube feedings ? ? ? ? ? ?A physical therapy consult is indicated based on the patient?s mobility assessment. ? ? ?Mobility Assessment (last 72 hours)   ? ? Mobility Assessment   ? ? [place] Name 03/29/21 2000 03/29/21 1800 03/29/21 1400 03/29/21 1200 03/29/21 0800  ? Does patient have an order for bedrest or is patient medically unstable Yes- Bedfast (Level 1) - Complete Yes- Bedfast (Level 1) - Complete Yes- Bedfast (Level 1) - Complete Yes- Bedfast (Level 1) - Complete Yes- Bedfast (Level 1) - Complete  ? What is the highest level of mobility based on the progressive mobility assessment? Level 1 (Bedfast) - Unable to balance while sitting on edge of bed -- Level 1 (Bedfast) - Unable to balance while sitting on edge of bed Level 1 (Bedfast) - Unable to balance while sitting on edge of bed Level 1 (Bedfast) - Unable to balance while sitting on edge of bed  ? Is the above level different from baseline mobility prior to current illness? No - Consider discontinuing PT/OT -- Yes - Recommend PT order Yes - Recommend PT order Yes - Recommend PT order  ? ? [place] Name 03/28/21 2000 03/28/21 0800  ?  ?  ?  ? Does patient have an order for bedrest or is patient medically unstable Yes- Bedfast (Level 1) - Complete Yes- Bedfast (Level 1) - Complete     ? What is the highest level of mobility based on the progressive mobility assessment? Level 1 (Bedfast) - Unable to  balance while sitting on edge of bed Level 1 (Bedfast) - Unable to balance while sitting on edge of bed     ? Is the above level different from baseline mobility prior to current illness? No - Consider discontinuing PT/OT No - Consider  discontinuing PT/OT     ? ?  ?  ? ?  ? ? ? ?Acute otitis media, right with mastoid effusion ?Patient with recent bilateral conjunctivitis that persisted over 2 weeks despite pharmacological therapy ?Maxillofacial CT revealed new right middle ear and mastoid opacification consistent with otitis media with mastoid effusion ?Will need follow-up CT to evaluate for resolution.  If not resolved consideration should be given to ENT evaluation to determine if a candidate for myringotomy tube --Right TM unremarkable and without signs of infection when examined ? ? ?SBO ruled out/constipation-resolved as of 04/14/2021 ?Developed emesis and abdominal distention on the afternoon of 3/9 ?Symptoms worsen.  KUB revealed SBO but CT revealed obstipation ?Symptoms resolved with resumption of laxatives ? ? ? ? ?Fever 2/2 Enterobacter UTI ?3/25 TM 100.1 ?3/27 WBC 14.6 ?Ck UA/cx, tracheal asp and bld cxs ? ?@@ 3/28 Urine cx positve for > 100,000 colonies of Enterobacter -changed to cefepime 3/28 due to positive sputum (serratia)-continue for 7 days today is D#2 ? 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  Sputum culture with Serratia.  Likely a contaminant but given increase in secretions will treat as active infection.  Currently on cefepime for Enterobacter UTI which will cover Serratia ?Clinically appears worse and secretions remain thick so we will change Mucinex per tube to scheduled and add Mucomyst nebs.  We will also change IV fluids to D5 one half normal saline and increase to 100 cc/h to  help thin secretions and help with insensible fluid loss from respiratory illness. ?Obtain ABG and repeat chest x-ray noting last chest x-ray completed on 3/28.  Patient is a full code and I am concerned he may need to be placed back on the ventilator. ?CRP elevated so repeat in am along with CMET and CBC/Diff--PCT normal x 2 ? ? ?Malignant hypertension ?Blood pressure stable on Norvasc Coreg, hydralazine, and clonidine ? ? ?Diabetes mellitus type 2 in obese (HCC) ?-A1c 6.3 in 12/31/2020 ?-Continue SSI every 4 hours ?-tradjenta  ?-CBGs well controlled but still requiring supplemental SSI so we will add low-dose Glucotrol ? ? ? ?  ? ?hypernatremia ?Continue free water flushes of 300 cc every 4 hours  ?3/27 D5W at low rate added for persistent hypernatremia ?Continue to follow lab ? ? ?Acute conjunctivitis, bilateral ?Resolved ? ?Pneumonia due to Klebsiella pneumoniae (HCC)-resolved as of 04/01/2021 ?Resolved ? ?Protein-calorie malnutrition/dysphagia due to recent stroke/constipation ?Nutrition Problem: Inadequate oral intake ?Etiology: lethargy/confusion, dysphagia ?Signs/Symptoms: NPO status ?Interventions: Tube feeding via PEG ?Body mass index is 23.33 kg/m?Marland Kitchen  ?Continue Glucerna ? ?  ? ?Acute urinary retention-resolved as of 03/30/2021 ?-Continue Urecholine ?-Foley catheter discontinued on 1/11 and patient voiding easily ? ?Transaminitis ?-2/2 Zyprexa ?-Resolved ? ?MSSA (methicillin susceptible Staphylococcus aureus) pneumonia (HCC)-resolved as of 04/02/2021 ?Resolved ? ?Pressure injury of skin-resolved as of 04/02/2021 ?Resolved-prior anus ulcer ? ?Pneumonia due to Serratia marcescens (  HCC)-resolved as of 04/01/2021 ?Resolved ? ?Acute kidney injury on CKD 3b/acute azotemia with mild hypernatremia ?BUN elevated at 55 with normal creatinine of 1.2 and a GFR greater than 60 ? ?Anemia ?Hb downtrending, follow  ?No evidence bleeding, labs c/w AOCD and iron def ? ?Constipation ?Had additional episode of constipation that  appeared to be consistent with SBO on plain films but CT confirmed significant colonic stool retention ?Laxatives resumed ? ? ? ? ? ? ? ?Subjective:  ?Unresponsive ? ?Physical Exam: ?Vitals:  ? 04/29/21 0300 04/29/21 0310

## 2021-04-29 NOTE — Plan of Care (Signed)
?  Problem: Nutrition: ?Goal: Adequate nutrition will be maintained ?Outcome: Adequate for Discharge ?  ?Problem: Elimination: ?Goal: Will not experience complications related to bowel motility ?Outcome: Adequate for Discharge ?  ?Problem: Pain Managment: ?Goal: General experience of comfort will improve ?Outcome: Adequate for Discharge ?  ?

## 2021-04-30 DIAGNOSIS — K567 Ileus, unspecified: Secondary | ICD-10-CM

## 2021-04-30 DIAGNOSIS — D508 Other iron deficiency anemias: Secondary | ICD-10-CM

## 2021-04-30 LAB — CBC WITH DIFFERENTIAL/PLATELET
Abs Immature Granulocytes: 0.09 10*3/uL — ABNORMAL HIGH (ref 0.00–0.07)
Basophils Absolute: 0 10*3/uL (ref 0.0–0.1)
Basophils Relative: 0 %
Eosinophils Absolute: 0.3 10*3/uL (ref 0.0–0.5)
Eosinophils Relative: 3 %
HCT: 26.4 % — ABNORMAL LOW (ref 39.0–52.0)
Hemoglobin: 7.7 g/dL — ABNORMAL LOW (ref 13.0–17.0)
Immature Granulocytes: 1 %
Lymphocytes Relative: 13 %
Lymphs Abs: 1.4 10*3/uL (ref 0.7–4.0)
MCH: 23.1 pg — ABNORMAL LOW (ref 26.0–34.0)
MCHC: 29.2 g/dL — ABNORMAL LOW (ref 30.0–36.0)
MCV: 79 fL — ABNORMAL LOW (ref 80.0–100.0)
Monocytes Absolute: 1 10*3/uL (ref 0.1–1.0)
Monocytes Relative: 10 %
Neutro Abs: 7.4 10*3/uL (ref 1.7–7.7)
Neutrophils Relative %: 73 %
Platelets: 194 10*3/uL (ref 150–400)
RBC: 3.34 MIL/uL — ABNORMAL LOW (ref 4.22–5.81)
RDW: 16.8 % — ABNORMAL HIGH (ref 11.5–15.5)
WBC: 10.2 10*3/uL (ref 4.0–10.5)
nRBC: 0.2 % (ref 0.0–0.2)

## 2021-04-30 LAB — COMPREHENSIVE METABOLIC PANEL
ALT: 96 U/L — ABNORMAL HIGH (ref 0–44)
AST: 37 U/L (ref 15–41)
Albumin: 2.1 g/dL — ABNORMAL LOW (ref 3.5–5.0)
Alkaline Phosphatase: 45 U/L (ref 38–126)
Anion gap: 10 (ref 5–15)
BUN: 42 mg/dL — ABNORMAL HIGH (ref 6–20)
CO2: 23 mmol/L (ref 22–32)
Calcium: 9.5 mg/dL (ref 8.9–10.3)
Chloride: 114 mmol/L — ABNORMAL HIGH (ref 98–111)
Creatinine, Ser: 1.21 mg/dL (ref 0.61–1.24)
GFR, Estimated: >60 mL/min (ref >60)
Glucose, Bld: 112 mg/dL — ABNORMAL HIGH (ref 70–99)
Potassium: 3.3 mmol/L — ABNORMAL LOW (ref 3.5–5.1)
Sodium: 147 mmol/L — ABNORMAL HIGH (ref 135–145)
Total Bilirubin: 0.2 mg/dL — ABNORMAL LOW (ref 0.3–1.2)
Total Protein: 6.1 g/dL — ABNORMAL LOW (ref 6.5–8.1)

## 2021-04-30 LAB — GLUCOSE, CAPILLARY
Glucose-Capillary: 114 mg/dL — ABNORMAL HIGH (ref 70–99)
Glucose-Capillary: 122 mg/dL — ABNORMAL HIGH (ref 70–99)
Glucose-Capillary: 116 mg/dL — ABNORMAL HIGH (ref 70–99)
Glucose-Capillary: 113 mg/dL — ABNORMAL HIGH (ref 70–99)
Glucose-Capillary: 102 mg/dL — ABNORMAL HIGH (ref 70–99)
Glucose-Capillary: 104 mg/dL — ABNORMAL HIGH (ref 70–99)

## 2021-04-30 LAB — C-REACTIVE PROTEIN: CRP: 6.8 mg/dL — ABNORMAL HIGH (ref <1.0)

## 2021-04-30 LAB — CULTURE, RESPIRATORY W GRAM STAIN

## 2021-04-30 LAB — MAGNESIUM: Magnesium: 2.2 mg/dL (ref 1.7–2.4)

## 2021-04-30 MED ORDER — KCL IN DEXTROSE-NACL 20-5-0.45 MEQ/L-%-% IV SOLN
INTRAVENOUS | Status: DC
  Administered 2021-05-01: 1000 mL via INTRAVENOUS
  Filled 2021-04-30 (×6): qty 1000

## 2021-04-30 MED ORDER — POTASSIUM CHLORIDE 10 MEQ/100ML IV SOLN: 10.0000 meq | INTRAVENOUS | Status: DC

## 2021-04-30 MED ORDER — POTASSIUM CHLORIDE 10 MEQ/100ML IV SOLN
10.0000 meq | INTRAVENOUS | Status: DC
  Administered 2021-04-30 (×2): 10 meq via INTRAVENOUS
  Filled 2021-04-30: qty 100

## 2021-04-30 MED ORDER — CLONIDINE HCL 0.1 MG/24HR TD PTWK
0.1000 mg | MEDICATED_PATCH | TRANSDERMAL | Status: DC
  Administered 2021-04-30 – 2021-05-07 (×2): 0.1 mg via TRANSDERMAL
  Filled 2021-04-30 (×2): qty 1

## 2021-04-30 MED ORDER — METOCLOPRAMIDE HCL 5 MG/ML IJ SOLN
5.0000 mg | Freq: Four times a day (QID) | INTRAMUSCULAR | Status: DC
  Administered 2021-04-30 – 2021-05-08 (×33): 5 mg via INTRAVENOUS
  Filled 2021-04-30 (×32): qty 2

## 2021-04-30 MED ORDER — POTASSIUM CHLORIDE 10 MEQ/100ML IV SOLN
10.0000 meq | INTRAVENOUS | Status: DC
  Administered 2021-04-30: 10 meq via INTRAVENOUS
  Filled 2021-04-30 (×2): qty 100

## 2021-04-30 MED ORDER — METOPROLOL TARTRATE 5 MG/5ML IV SOLN
5.0000 mg | Freq: Three times a day (TID) | INTRAVENOUS | Status: DC
  Administered 2021-04-30 – 2021-05-05 (×16): 5 mg via INTRAVENOUS
  Filled 2021-04-30 (×16): qty 5

## 2021-04-30 MED ORDER — ALBUTEROL SULFATE (2.5 MG/3ML) 0.083% IN NEBU
2.5000 mg | INHALATION_SOLUTION | RESPIRATORY_TRACT | Status: DC | PRN
  Administered 2021-04-30 (×3): 2.5 mg via RESPIRATORY_TRACT
  Filled 2021-04-30 (×3): qty 3

## 2021-04-30 MED ORDER — LABETALOL HCL 5 MG/ML IV SOLN
5.0000 mg | INTRAVENOUS | Status: DC | PRN
  Administered 2021-05-05 – 2021-05-19 (×18): 5 mg via INTRAVENOUS
  Filled 2021-04-30 (×21): qty 4

## 2021-04-30 MED ORDER — METHOCARBAMOL 1000 MG/10ML IJ SOLN
500.0000 mg | Freq: Three times a day (TID) | INTRAMUSCULAR | Status: DC
  Administered 2021-04-30 – 2021-05-22 (×66): 500 mg via INTRAVENOUS
  Filled 2021-04-30: qty 5
  Filled 2021-04-30: qty 500
  Filled 2021-04-30: qty 5
  Filled 2021-04-30: qty 500
  Filled 2021-04-30 (×2): qty 5
  Filled 2021-04-30 (×2): qty 500
  Filled 2021-04-30: qty 5
  Filled 2021-04-30 (×3): qty 500
  Filled 2021-04-30: qty 5
  Filled 2021-04-30: qty 500
  Filled 2021-04-30 (×2): qty 5
  Filled 2021-04-30: qty 500
  Filled 2021-04-30: qty 5
  Filled 2021-04-30: qty 500
  Filled 2021-04-30: qty 5
  Filled 2021-04-30 (×2): qty 500
  Filled 2021-04-30 (×3): qty 5
  Filled 2021-04-30 (×4): qty 500
  Filled 2021-04-30: qty 5
  Filled 2021-04-30: qty 500
  Filled 2021-04-30 (×9): qty 5
  Filled 2021-04-30 (×3): qty 500
  Filled 2021-04-30 (×3): qty 5
  Filled 2021-04-30 (×2): qty 500
  Filled 2021-04-30 (×4): qty 5
  Filled 2021-04-30: qty 500
  Filled 2021-04-30 (×3): qty 5
  Filled 2021-04-30 (×2): qty 500
  Filled 2021-04-30 (×2): qty 5
  Filled 2021-04-30: qty 500
  Filled 2021-04-30: qty 5
  Filled 2021-04-30 (×2): qty 500
  Filled 2021-04-30 (×5): qty 5
  Filled 2021-04-30 (×2): qty 500
  Filled 2021-04-30 (×2): qty 5

## 2021-04-30 MED ORDER — POTASSIUM CHLORIDE 10 MEQ/100ML IV SOLN
10.0000 meq | INTRAVENOUS | Status: AC
  Administered 2021-04-30: 10 meq via INTRAVENOUS
  Filled 2021-04-30: qty 100

## 2021-04-30 NOTE — Progress Notes (Signed)
CXR yesterday showed possible ileus, TF held overnight, patient on D5 1/2NS at 161ml/hr contacted Dr. Kerry Hough and Junious Silk NP, will hold PO meds today, waiting for orders for IV meds. ?

## 2021-04-30 NOTE — Progress Notes (Signed)
ATC setup changed 

## 2021-04-30 NOTE — Plan of Care (Signed)
?  Problem: Clinical Measurements: ?Goal: Cardiovascular complication will be avoided ?Outcome: Progressing ?  ?Problem: Pain Managment: ?Goal: General experience of comfort will improve ?Outcome: Progressing ?  ?Problem: Safety: ?Goal: Ability to remain free from injury will improve ?Outcome: Progressing ?  ?Problem: Skin Integrity: ?Goal: Risk for impaired skin integrity will decrease ?Outcome: Progressing ?  ?Problem: Nutrition: ?Goal: Risk of aspiration will decrease ?Outcome: Progressing ?  ?

## 2021-04-30 NOTE — Progress Notes (Addendum)
? ?Progress Note ? ? ?Patient: Stephen Delgado Cuthbert [id number] DOB: [birth year] DOA: 12/30/2020     121 ?DOS: the patient was seen and examined on 04/30/2021 ? ? ? ?  ?Brief hospital course: ?56 y.o. male with PMH significant for DM2, HTN, CKD with questionable compliance to medications ?Patient presented to the ED on 12/30/2020 with complaint of headache, altered mental status ?  ?In the ED, he was hypertensive to 238/160, agitated, required restraints ?CT head showed large amount of intraventricular hemorrhage involving lateral/third/fourth ventricle, ICH and left basal ganglia as well as chronic microvascular ischemic changes. ?He was started on Cleviprex drip and admitted to neuro ICU ?  ?He subsequently had CT head repeated on 11/30, 12/1 and 12/3, all demonstrated unchanged intraparenchymal hemorrhage and intraventricular hemorrhage ?12/3, MRI brain from also showed numerous small acute infarcts in both cerebral hemisphere with minimal improvement in the right superior cerebellum ?12/3, intubated ?12/13, tracheostomy ?12/15, wean from vent to trach collar ?12/16, PEG tube placement ?12/16, repeat MRI showed an increase in the size and number of acute infarcts in the bilateral hemispheres with increased involvement in the cerebellum bilaterally.   ?12/18, transferred from neuro service to [facility]  ?12/21, palliative care consulted.  Family wanted to continue aggressive care as full CODE STATUS. ?12/23 developed Cheyne-Stokes respiratory pattern with respiratory alkalosis.  Transferred to ICU for short-term mechanical ventilation and was found to have new tracheobronchitis now on cefepime ?12/25 back to progressive floor and to TRH ?  ?His hospital course has been complicated by significant neurological impairment, aspiration events, AKI, hypernatremia, urinary retention. ?1/9 trach changed to #6.0 Shiley cuffless--1/10 > 30 days since trach inserted. ? 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  Staff educating family regarding the care of this patient including suctioning, administration of medications and tube feedings ? ? ? ? ? ?A physical therapy consult is indicated based on the patient?s mobility assessment. ? ? ?Mobility Assessment (last 72 hours)   ? ? Mobility Assessment   ? ? [place] Name 03/29/21 2000 03/29/21 1800 03/29/21 1400 03/29/21 1200 03/29/21 0800  ? Does patient have an order for bedrest or is patient medically unstable Yes- Bedfast (Level 1) - Complete Yes- Bedfast (Level 1) - Complete Yes- Bedfast (Level 1) - Complete Yes- Bedfast (Level 1) - Complete Yes- Bedfast (Level 1) - Complete  ? What is the highest level of mobility based on the progressive mobility assessment? Level 1 (Bedfast) - Unable to balance while sitting on edge of bed -- Level 1 (Bedfast) - Unable to balance while sitting on edge of bed Level 1 (Bedfast) - Unable to balance while sitting on edge of bed Level 1 (Bedfast) - Unable to balance while sitting on edge of bed  ? Is the above level different from baseline mobility prior to current illness? No - Consider discontinuing PT/OT -- Yes - Recommend PT order Yes - Recommend PT order Yes - Recommend PT order  ? ? [place] Name 03/28/21 2000 03/28/21 0800  ?  ?  ?  ? Does patient have an order for bedrest or is patient medically unstable Yes- Bedfast (Level 1) - Complete Yes- Bedfast (Level 1) - Complete     ? What is the highest level of mobility based on the progressive mobility  assessment? Level 1 (Bedfast) - Unable to balance while sitting on edge of bed Level 1 (Bedfast) - Unable to balance while sitting on edge of bed     ? Is the above level different from baseline mobility prior to current illness? No - Consider  discontinuing PT/OT No - Consider discontinuing PT/OT     ? ?  ?  ? ?  ? ? ? ?Ileus with hypokalemia ?Patient with significant ileus likely related to acute UTI with concomitant pneumonia ?N.p.o. with tube feedings on hold and G-tube to straight drain ?Hold free water ?Replace potassium and magnesium as needed; magnesium 2.2 ?Follow-up chest x-ray on 3/31 ? ?Acute otitis media, right with mastoid effusion-resolved as of 04/30/2021 ?Patient with recent bilateral conjunctivitis that persisted over 2 weeks despite pharmacological therapy ?Maxillofacial CT revealed new right middle ear and mastoid opacification consistent with otitis media with mastoid effusion ?Will need follow-up CT to evaluate for resolution.  If not resolved consideration should be given to ENT evaluation to determine if a candidate for myringotomy tube --Right TM unremarkable and without signs of infection when examined ? ? ?SBO ruled out/constipation-resolved as of 04/14/2021 ?Developed emesis and abdominal distention on the afternoon of 3/9 ?Symptoms worsen.  KUB revealed SBO but CT revealed obstipation ?Symptoms resolved with resumption of laxatives ? ? ? ? ?Anemia ?With hydration hemoglobin has decreased to 7.7 with an MCV of 79 ?Continue to follow ?Unclear if this is solely related to recent hydration or true reading ? ?Fever 2/2 Enterobacter UTI ? 3/28 Urine cx positve for > 100,000 colonies of Enterobacter -changed to cefepime 3/28 due to positive sputum (serratia)-continue for 7 days today is D#3 ? 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  Bedside nurse states secretions have thinned  considerably since initiation ?To decreased from 50% to 28% with patient maintaining sats now 100% ?Continue cefepime for total of 7 days ? ? ?Malignant hypertension ?Given ileus hold per tube Norvasc Coreg, hydralazine, and clonidine in favor of IV labetalol and a Apresoline ?Continue IV Lopressor to replace Coreg and to prevent beta-blocker withdrawal ?Transition to clonidine to topical ? ? ?Diabetes mellitus type 2 in obese (HCC) ?-A1c 6.3 in 12/31/2020 ?-Continue SSI every 4 hours ?-tradjenta  ?-CBGs well controlled but still requiring supplemental SSI so we will add low-dose Glucotrol ? ? ? ?  ? ?hypernatremia ?Continue free water flushes of 300 cc every 4 hours  ?3/27 D5W at low rate added for persistent hypernatremia ?Continue to follow lab ? ? ?Acute conjunctivitis, bilateral ?Resolved ? ?Pneumonia due to Klebsiella pneumoniae (HCC)-resolved as of 04/01/2021 ?Resolved ? ?Protein-calorie malnutrition/dysphagia due to recent stroke/constipation ?Nutrition Problem: Inadequate oral intake ?Etiology: lethargy/confusion, dysphagia ?Signs/Symptoms: NPO status ?Interventions: Tube feeding via PEG ?Body mass index is 23.33 kg/m?Marland Kitchen  ?Continue Glucerna ? ?  ? ?Acute urinary retention-resolved as of 03/30/2021 ?-Continue Urecholine ?-Foley catheter discontinued on 1/11 and patient voiding easily ? ?Transaminitis ?- Reemergence of mild nonobstructive transaminitis with ALT currently elevated at 92-May be related to acute infectious process-unable to give per tube medications so Zyprexa on hold ?-Continue to follow lab ? ?MSSA (methicillin susceptible Staphylococcus aureus) pneumonia (HCC)-resolved as of 04/02/2021 ?Resolved ? ?Pressure injury of skin-resolved as  of 04/02/2021 ?Resolved-prior anus ulcer ? ?Pneumonia due to Serratia marcescens (HCC)-resolved as of 04/01/2021 ?Resolved ? ?Acute kidney injury on CKD 3b/acute azotemia with mild hypernatremia ?BUN elevated at 55 with normal creatinine of 1.2 and a GFR greater than  60 ? ?Constipation ?Had additional episode of constipation that appeared to be consistent with SBO on plain films but CT confirmed significant colonic stool retention ?Laxatives resumed ? ? ? ? ? ? ? ?Subjective:  ?Unresp

## 2021-04-30 NOTE — Progress Notes (Signed)
Patient seen today by trach team for consult. No education needed at this time.  All the necessary equipment is at bedside.  Will continue to follow for progression. ?

## 2021-04-30 NOTE — Assessment & Plan Note (Addendum)
Patient with significant ileus likely related to acute UTI with concomitant pneumonia ?Gastric tube placed to straight drain for greater than 24 hours ?Patient has not had any bowel movements as of yet.  Per staff, last recorded bowel movement was 3/30 ?Started on MiraLAX, will give tapwater enema and hold tube feeds until he has further bowel movements ?We will also give a dose of milk of mag today since he has not had any significant bowel movements as of yet ?

## 2021-05-01 ENCOUNTER — Inpatient Hospital Stay (HOSPITAL_COMMUNITY): Payer: 59

## 2021-05-01 LAB — CBC WITH DIFFERENTIAL/PLATELET
Abs Immature Granulocytes: 0.13 10*3/uL — ABNORMAL HIGH (ref 0.00–0.07)
Basophils Absolute: 0 10*3/uL (ref 0.0–0.1)
Basophils Relative: 0 %
Eosinophils Absolute: 0.1 10*3/uL (ref 0.0–0.5)
Eosinophils Relative: 1 %
HCT: 28.3 % — ABNORMAL LOW (ref 39.0–52.0)
Hemoglobin: 8.5 g/dL — ABNORMAL LOW (ref 13.0–17.0)
Immature Granulocytes: 1 %
Lymphocytes Relative: 12 %
Lymphs Abs: 1.5 10*3/uL (ref 0.7–4.0)
MCH: 23.4 pg — ABNORMAL LOW (ref 26.0–34.0)
MCHC: 30 g/dL (ref 30.0–36.0)
MCV: 78 fL — ABNORMAL LOW (ref 80.0–100.0)
Monocytes Absolute: 1.1 10*3/uL — ABNORMAL HIGH (ref 0.1–1.0)
Monocytes Relative: 8 %
Neutro Abs: 9.9 10*3/uL — ABNORMAL HIGH (ref 1.7–7.7)
Neutrophils Relative %: 78 %
Platelets: 205 10*3/uL (ref 150–400)
RBC: 3.63 MIL/uL — ABNORMAL LOW (ref 4.22–5.81)
RDW: 16.8 % — ABNORMAL HIGH (ref 11.5–15.5)
WBC: 12.8 10*3/uL — ABNORMAL HIGH (ref 4.0–10.5)
nRBC: 0 % (ref 0.0–0.2)

## 2021-05-01 LAB — COMPREHENSIVE METABOLIC PANEL
ALT: 81 U/L — ABNORMAL HIGH (ref 0–44)
AST: 34 U/L (ref 15–41)
Albumin: 2.2 g/dL — ABNORMAL LOW (ref 3.5–5.0)
Alkaline Phosphatase: 51 U/L (ref 38–126)
Anion gap: 8 (ref 5–15)
BUN: 31 mg/dL — ABNORMAL HIGH (ref 6–20)
CO2: 21 mmol/L — ABNORMAL LOW (ref 22–32)
Calcium: 9.8 mg/dL (ref 8.9–10.3)
Chloride: 115 mmol/L — ABNORMAL HIGH (ref 98–111)
Creatinine, Ser: 1.18 mg/dL (ref 0.61–1.24)
GFR, Estimated: >60 mL/min (ref >60)
Glucose, Bld: 111 mg/dL — ABNORMAL HIGH (ref 70–99)
Potassium: 4 mmol/L (ref 3.5–5.1)
Sodium: 144 mmol/L (ref 135–145)
Total Bilirubin: 0.4 mg/dL (ref 0.3–1.2)
Total Protein: 6.8 g/dL (ref 6.5–8.1)

## 2021-05-01 LAB — GLUCOSE, CAPILLARY
Glucose-Capillary: 100 mg/dL — ABNORMAL HIGH (ref 70–99)
Glucose-Capillary: 105 mg/dL — ABNORMAL HIGH (ref 70–99)
Glucose-Capillary: 108 mg/dL — ABNORMAL HIGH (ref 70–99)
Glucose-Capillary: 108 mg/dL — ABNORMAL HIGH (ref 70–99)
Glucose-Capillary: 118 mg/dL — ABNORMAL HIGH (ref 70–99)
Glucose-Capillary: 132 mg/dL — ABNORMAL HIGH (ref 70–99)

## 2021-05-01 MED ORDER — GLUCERNA 1.5 CAL PO LIQD
1000.0000 mL | ORAL | Status: DC
  Administered 2021-05-01 – 2021-05-02 (×2): 1000 mL
  Filled 2021-05-01 (×2): qty 1000

## 2021-05-01 NOTE — Progress Notes (Addendum)
? ?NAME:  Stephen Delgado, MRN:  [id number], DOB:  1965-06-29, LOS: 122 ?ADMISSION DATE:  12/30/2020, CONSULTATION DATE:  12/19 ?REFERRING MD:  Erlinda Hong, CHIEF COMPLAINT:  Dyspnea  ? ?History of Present Illness:  ?56 yo male presented with headache, nausea and altered mental status.  CT head showed acute Lt ICH with IVH likely related to hypertensive emergency (BP 195/116). Had persistent hypertension and concern for airway protection, and PCCM asked to assist with ICU management.  Tracheostomy 12/13.  Moved out of ICU.  ? ?Pertinent  Medical History  ?HTN, DM type 2, CKD 3a, OSA ? ?Significant Hospital Events: ?Including procedures, antibiotic start and stop dates in addition to other pertinent events   ?11/29 Admit, start cleviprex ?12/01 PCCM consulted; changed from cleviprex to cardene due to elevated triglycerides ?12/03 intubated; episode of vomiting >> tube feeds held ?12/04 resume trickle tube feeds ?12/06 remains minimally responsive. Tolerating SBT.  ?12/12 no acute events overnight, T-max 102 point ?12/13 bedside trach planned midmorning  ?12/14 ATC. Lasix --> 5L UOP  ?12/15 cont on trach collar. Na to 154 from 145. Adding low rate d5  ?12/16 scheduled for PEG. Cont D5. Continues on trach collar ?Out of ICU 12/19 ?12/23 Cheyne-Stokes respirations noted and was transferred to ICU for short-term ventilation.  ?12/25 Returned to floor ?1/9 Trach changed to 6 cuffless  ?2/6 Trach follow up, no major changes, remains on ATC  ?2/27 trach f/u, on TC, seems to attend briefly, otherwise poor neuro exam ?04/06/2021 Trach FU, On ATC at 28%, no change in MS, neuro exam ? ?Imaging/Studies: ?CT head 11/29 >> large amount of IVH involving lateral/3rd/4th ventricles, ICH in Lt BG, chronic microvascular ischemic changes ?Echo 11/30 >> EF 50 to 55%, mod LVH, grade 1 DD, ascending aorta 37 mm ?EEG 12/01 >> continuous generalized slowing ?MRI brain 12/03 >> numerous small acute infarcts in b/l cerebral hemispheres, Lt caudate  hemorrhage, IVH, scattered sulcal SAH, mild communicating hydrocephalus ?CT head 12/23 Expected evolution of strokes, no shift or herniation ? ? ? ?Interim History / Subjective:  ? ?No new issues ? ?Objective   ?Blood pressure (!) 153/85, pulse 91, temperature 98.8 ?F (37.1 ?C), temperature source Oral, resp. rate 16, height 6' (1.829 m), weight 111.1 kg, SpO2 100 %. ?   ?FiO2 (%):  [28 %] 28 %  ? ?Intake/Output Summary (Last 24 hours) at 05/01/2021 1327 ?Last data filed at 05/01/2021 0428 ?Gross per 24 hour  ?Intake 2404.1 ml  ?Output 2000 ml  ?Net 404.1 ml  ? ?Filed Weights  ? 03/16/21 0500 04/13/21 1700 04/28/21 0500  ?Weight: 83.5 kg 108 kg 111.1 kg  ? ?Physical Exam: ?General: Chronically ill-appearing, no acute distress ?HENT: [place], AT ?Neck: #6 cuffless trach ?Eyes: EOMI, no scleral icterus ?Respiratory: Clear to auscultation bilaterally.  No crackles, wheezing or rales ?Cardiovascular: RRR, -M/R/G, no JVD ?Extremities: Contracture ?Neuro: Eyes open, no tracking ? ? ?Resolved Hospital Problem list   ? ? ?Assessment & Plan:  ? 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 ?Pulmonary will continue to follow  weekly ? ?Care Time: 35 min ? ?Rodman Pickle, M.D. ?[facility] ?05/01/2021 1:32 PM  ? ?See Amion for personal pager ?For hours between 7 PM to 7 AM, please call Elink for urgent questions ? ? ? ? ? ? ?

## 2021-05-01 NOTE — Procedures (Signed)
Tracheostomy Change Note ? ?Patient Details:   ?Name: Stephen Delgado ?DOB: 05/23/1965 ?MRN: [id number] ?   ?Airway Documentation: ?   ? ?Evaluation ? O2 sats: stable throughout ?Complications: No apparent complications ?Patient did tolerate procedure well. ?Bilateral Breath Sounds: Rhonchi ? ?Pt trached changed per protocol. RT x2. Positive color changed noted on ETCO2. RT will continue to monitor.  ?  ? ?Stephen Delgado ?05/01/2021, 4:00 PM ?

## 2021-05-01 NOTE — TOC Progression Note (Signed)
Transition of Care (TOC) - Progression Note  ? ? ?Patient Details  ?Name: Stephen Delgado ?MRN: [id number] ?Date of Birth: 13-Mar-1965 ? ?Transition of Care (TOC) CM/SW Contact  ?Curlene Labrum, RN ?Phone Number: ?05/01/2021, 1:49 PM ? ?Clinical Narrative:    ?CM met with the patient's wife , Stephen Delgado, to continue discussion with her regarding discharging the patient home with family - likely 05/11/2021 once Insurance Authorization has been approved through [employer] - [employer]ment/textile technologist.  I encouraged the patient's wife to schedule time to spend at least 2 shifts during the day to support teaching for patient care with bedside nursing and RT.  [name] states that she is scheduled for vacation in the next week.   ? ?The patient at this time has been unstable for discharge to home - due to recent fever, aspiration pneumonia, UTI and likely ileus.  I spoke with Barbette Or, MSW Supervisor and Dr. Ree Kida regarding follow up with Ethic referral.  Ethics referral request was placed by [facility], MSW yesterday and sent to attending physician, Roderic Palau, MD and Erin Hearing, NP. ? ?I called and spoke with Earnest Rosier, CM with [facility] [employer] [facility] and insurance is still pending with Christella Scheuermann and he states that insurance has requested additional orders from PCP.  I offered clinicals and support as needed.  A secure email was send for follow up from [place] regarding dme orders pending through [employer] and PCP follow up with [place] [facility] via remote visit on 05/13/2021 per discharge instructions.  Medlin states that he spoke with the patient's brother, Stephen Delgado, yesterday and was able to update him regarding discharge teaching and care through [place] duty trach team.  Medlin states that he left a message with the patient's son, Stephen Delgado and is expecting a phone call back. ? ? ?Expected Discharge Plan: [place] ?Barriers to Discharge: Continued Medical Work up, No SNF  bed ? ?Expected Discharge Plan and Services ?Expected Discharge Plan: [place] ?In-house Referral: Clinical Social Work, Hospice / [place], PCP / [facility] ?Discharge Planning Services: CM Consult ?Post Acute Care Choice: Home Health ?Living arrangements for the past 2 months: [place] ?                ?DME Arranged: Hospital bed, Trach supplies, Tube feeding, Oxygen ?DME Agency: [employer] ?Date DME Agency Contacted: 03/18/21 ?Time DME Agency Contacted: 1000 ?Representative spoke with at DME Agency: Bethanne Ginger, CM with [employer] ?  ?  ?  ?  ?  ? ? ?Social Determinants of Health (SDOH) Interventions ?  ? ?Readmission Risk Interventions ? ?  02/20/2021  ?  9:31 AM  ?Readmission Risk Prevention Plan  ?Transportation Screening Complete  ?Medication Review Press photographer) Complete  ?PCP or Specialist appointment within 3-5 days of discharge Not Complete  ?PCP/Specialist Appt Not Complete comments Patient will need LTC placement at SNF facility.  ?[place] or Home Care Consult Complete  ?SW Recovery Care/Counseling Consult Complete  ?Palliative Care Screening Complete  ?Skilled Nursing Facility Complete  ? ? ?

## 2021-05-01 NOTE — Progress Notes (Signed)
? ?Progress Note ? ? ?Patient: Stephen Delgado HAL:[id number] DOB: 10/14/1965 DOA: 12/30/2020     122 ?DOS: the patient was seen and examined on 05/01/2021 ? ? ? ?  ?Brief hospital course: ?56 y.o. male with PMH significant for DM2, HTN, CKD with questionable compliance to medications ?Patient presented to the ED on 12/30/2020 with complaint of headache, altered mental status ?  ?In the ED, he was hypertensive to 238/160, agitated, required restraints ?CT head showed large amount of intraventricular hemorrhage involving lateral/third/fourth ventricle, ICH and left basal ganglia as well as chronic microvascular ischemic changes. ?He was started on Cleviprex drip and admitted to neuro ICU ?  ?He subsequently had CT head repeated on 11/30, 12/1 and 12/3, all demonstrated unchanged intraparenchymal hemorrhage and intraventricular hemorrhage ?12/3, MRI brain from also showed numerous small acute infarcts in both cerebral hemisphere with minimal improvement in the right superior cerebellum ?12/3, intubated ?12/13, tracheostomy ?12/15, wean from vent to trach collar ?12/16, PEG tube placement ?12/16, repeat MRI showed an increase in the size and number of acute infarcts in the bilateral hemispheres with increased involvement in the cerebellum bilaterally.   ?12/18, transferred from neuro service to [facility]  ?12/21, palliative care consulted.  Family wanted to continue aggressive care as full CODE STATUS. ?12/23 developed Cheyne-Stokes respiratory pattern with respiratory alkalosis.  Transferred to ICU for short-term mechanical ventilation and was found to have new tracheobronchitis now on cefepime ?12/25 back to progressive floor and to TRH ?  ?His hospital course has been complicated by significant neurological impairment, aspiration events, AKI, hypernatremia, urinary retention. ?1/9 trach changed to #6.0 Shiley cuffless--1/10 > 30 days since trach inserted. ? 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  Staff educating family regarding the care of this patient including suctioning, administration of medications and tube feedings ? ? ? ? ? ?A physical therapy consult is indicated based on the patient?s mobility assessment. ? ? ?Mobility Assessment (last 72 hours)   ? ? Mobility Assessment   ? ? Row Name 03/29/21 2000 03/29/21 1800 03/29/21 1400 03/29/21 1200 03/29/21 0800  ? Does patient have an order for bedrest or is patient medically unstable Yes- Bedfast (Level 1) - Complete Yes- Bedfast (Level 1) - Complete Yes- Bedfast (Level 1) - Complete Yes- Bedfast (Level 1) - Complete Yes- Bedfast (Level 1) - Complete  ? What is the highest level of mobility based on the progressive mobility assessment? Level 1 (Bedfast) - Unable to balance while sitting on edge of bed -- Level 1 (Bedfast) - Unable to balance while sitting on edge of bed Level 1 (Bedfast) - Unable to balance while sitting on edge of bed Level 1 (Bedfast) - Unable to balance while sitting on edge of bed  ? Is the above level different from baseline mobility prior to current illness? No - Consider discontinuing PT/OT -- Yes - Recommend PT order Yes - Recommend PT order Yes - Recommend PT order  ? ? Row Name 03/28/21 2000 03/28/21 0800  ?  ?  ?  ? Does patient have an order for bedrest or is patient medically unstable Yes- Bedfast (Level 1) - Complete Yes- Bedfast (Level 1) - Complete     ? What is the highest level of mobility based on the progressive mobility  assessment? Level 1 (Bedfast) - Unable to balance while sitting on edge of bed Level 1 (Bedfast) - Unable to balance while sitting on edge of bed     ? Is the above level different from baseline mobility prior to current illness? No - Consider  discontinuing PT/OT No - Consider discontinuing PT/OT     ? ?  ?  ? ?  ? ? ? ?Ileus with hypokalemia ?Patient with significant ileus likely related to acute UTI with concomitant pneumonia ?Gastric tube placed to straight drain for greater than 24 hours ?Follow-up KUB on 3/31 reveals resolution of ileus ?Resume tube feedings at slow rate and gradually titrate up to goal rate of 60 cc/h ? ?Acute otitis media, right with mastoid effusion-resolved as of 04/30/2021 ?Patient with recent bilateral conjunctivitis that persisted over 2 weeks despite pharmacological therapy ?Maxillofacial CT revealed new right middle ear and mastoid opacification consistent with otitis media with mastoid effusion ?Will need follow-up CT to evaluate for resolution.  If not resolved consideration should be given to ENT evaluation to determine if a candidate for myringotomy tube --Right TM unremarkable and without signs of infection when examined ? ? ?SBO ruled out/constipation-resolved as of 04/14/2021 ?Developed emesis and abdominal distention on the afternoon of 3/9 ?Symptoms worsen.  KUB revealed SBO but CT revealed obstipation ?Symptoms resolved with resumption of laxatives ? ? ? ? ?Anemia ?With hydration hemoglobin has decreased to 7.7 with an MCV of 79 ?Continue to follow ?Unclear if this is solely related to recent hydration or true reading ? ?Fever 2/2 Enterobacter UTI ? 3/28 Urine cx positve for > 100,000 colonies of Enterobacter -changed to cefepime 3/28 due to positive sputum (serratia)-continue for 7 days today is D#3 ? 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  Bedside nurse states  secretions have thinned considerably since initiation ?To decreased from 50% to 28% with patient maintaining sats now 100% ?Continue cefepime for total of 7 days ?Improving noting decreased accretions and improvement in respiratory pattern ? ?Malignant hypertension ?Given ileus hold per tube Norvasc Coreg, hydralazine, and clonidine in favor of IV labetalol and a Apresoline ?Continue IV Lopressor to replace Coreg and to prevent beta-blocker withdrawal ?Transition to clonidine to topical ? ? ?Diabetes mellitus type 2 in obese (HCC) ?-A1c 6.3 in 12/31/2020 ?-Continue SSI every 4 hours ?-tradjenta  ?-CBGs well controlled but still requiring supplemental SSI so we will add low-dose Glucotrol ? ? ? ?  ? ?hypernatremia ?Continue free water flushes of 300 cc every 4 hours  ?3/27 D5W at low rate added for persistent hypernatremia ?Continue to follow lab ? ? ?Acute conjunctivitis, bilateral ?Resolved ? ?Pneumonia due to Klebsiella pneumoniae (HCC)-resolved as of 04/01/2021 ?Resolved ? ?Protein-calorie malnutrition/dysphagia due to recent stroke/constipation ?Nutrition Problem: Inadequate oral intake ?Etiology: lethargy/confusion, dysphagia ?Signs/Symptoms: NPO status ?Interventions: Tube feeding via PEG ?Body mass index is 23.33 kg/m?Marland Kitchen.  ?Continue Glucerna ? ?  ? ?Acute urinary retention-resolved as of 03/30/2021 ?-Continue Urecholine ?-Foley catheter discontinued on 1/11 and patient voiding easily ? ?Transaminitis ?- Reemergence of mild nonobstructive transaminitis with ALT currently elevated at 92-May be related to acute infectious process-unable to give per tube medications so Zyprexa on hold ?-Continue to follow lab ? ?MSSA (  methicillin susceptible Staphylococcus aureus) pneumonia (HCC)-resolved as of 04/02/2021 ?Resolved ? ?Pressure injury of skin-resolved as of 04/02/2021 ?Resolved-prior anus ulcer ? ?Pneumonia due to Serratia marcescens (HCC)-resolved as of 04/01/2021 ?Resolved ? ?Acute kidney injury on CKD 3b/acute azotemia  with mild hypernatremia ?BUN elevated at 55 with normal creatinine of 1.2 and a GFR greater than 60 ? ?Constipation ?Had additional episode of constipation that appeared to be consistent with SBO on plain films b

## 2021-05-01 NOTE — Plan of Care (Signed)
  Problem: Health Behavior/Discharge Planning: Goal: Ability to manage health-related needs will improve Outcome: Progressing   Problem: Clinical Measurements: Goal: Ability to maintain clinical measurements within normal limits will improve Outcome: Progressing   

## 2021-05-01 NOTE — Progress Notes (Signed)
Pharmacy Antibiotic Note ? ?Stephen Delgado is a 56 y.o. male admitted on 12/30/2020.  Prolonged hospital course now complicated by healthcare associated pneumonia.  Pharmacy has been consulted for cefepime dosing.  Today is day #4 of 7 planned for antibiotics. ? ?Plan: ?Continue  Cefepime 2 grams iv q8h .  Stop date in place.   ?Anticipate no further dose adjustments.  Rx will sign off. ? ?Height: 6' (182.9 cm) ?Weight: 111.1 kg (245 lb) ?IBW/kg (Calculated) : 77.6 ? ?Temp (24hrs), Avg:98.7 ?F (37.1 ?C), Min:98.4 ?F (36.9 ?C), Max:99.7 ?F (37.6 ?C) ? ?Recent Labs  ?Lab 04/27/21 ?0333 04/27/21 ?1533 04/28/21 ?0357 04/28/21 ?1809 04/29/21 ?0124 04/30/21 ?1884 05/01/21 ?0214  ?WBC 14.6*  --  13.0*  --  14.7* 10.2 12.8*  ?CREATININE 1.19   < > 1.19 1.23 1.20 1.21 1.18  ? < > = values in this interval not displayed.  ? ?  ?Estimated Creatinine Clearance: 88.9 mL/min (by C-G formula based on SCr of 1.18 mg/dL).   ? ?No Known Allergies ? ?Antimicrobials this admission: ? ?3/28 cefepime >>   (4/4) ? ? ?Thank you for allowing pharmacy to be a part of this patient?s care. ?[employer], Pharm.D., BCPS ?Clinical Pharmacist ? ?**Pharmacist phone directory can be found on [email or web address] listed under [facility]. ? ?05/01/2021 1:19 PM ? ?

## 2021-05-02 DIAGNOSIS — J9600 Acute respiratory failure, unspecified whether with hypoxia or hypercapnia: Secondary | ICD-10-CM | POA: Diagnosis not present

## 2021-05-02 DIAGNOSIS — R5081 Fever presenting with conditions classified elsewhere: Secondary | ICD-10-CM | POA: Diagnosis not present

## 2021-05-02 DIAGNOSIS — J156 Pneumonia due to other aerobic Gram-negative bacteria: Secondary | ICD-10-CM | POA: Diagnosis not present

## 2021-05-02 DIAGNOSIS — I6782 Cerebral ischemia: Secondary | ICD-10-CM | POA: Diagnosis not present

## 2021-05-02 LAB — GLUCOSE, CAPILLARY
Glucose-Capillary: 119 mg/dL — ABNORMAL HIGH (ref 70–99)
Glucose-Capillary: 120 mg/dL — ABNORMAL HIGH (ref 70–99)
Glucose-Capillary: 129 mg/dL — ABNORMAL HIGH (ref 70–99)
Glucose-Capillary: 141 mg/dL — ABNORMAL HIGH (ref 70–99)
Glucose-Capillary: 77 mg/dL (ref 70–99)
Glucose-Capillary: 80 mg/dL (ref 70–99)

## 2021-05-02 LAB — CULTURE, BLOOD (ROUTINE X 2)
Culture: NO GROWTH
Culture: NO GROWTH
Special Requests: ADEQUATE
Special Requests: ADEQUATE

## 2021-05-02 MED ORDER — POLYETHYLENE GLYCOL 3350 17 G PO PACK
17.0000 g | PACK | Freq: Two times a day (BID) | ORAL | Status: DC
  Administered 2021-05-03 – 2021-05-07 (×7): 17 g
  Filled 2021-05-02 (×9): qty 1

## 2021-05-02 NOTE — Progress Notes (Signed)
Report given to on-coming RN, Mindy.  ?

## 2021-05-02 NOTE — Progress Notes (Signed)
?Progress Note ? ? ?Patient: Pragyan Critelli Souders [id number] DOB: 1965-07-29 DOA: 12/30/2020     123 ?DOS: the patient was seen and examined on 05/02/2021 ?  ?Brief hospital course: ?56 y.o. male with PMH significant for DM2, HTN, CKD with questionable compliance to medications ?Patient presented to the ED on 12/30/2020 with complaint of headache, altered mental status ?  ?In the ED, he was hypertensive to 238/160, agitated, required restraints ?CT head showed large amount of intraventricular hemorrhage involving lateral/third/fourth ventricle, ICH and left basal ganglia as well as chronic microvascular ischemic changes. ?He was started on Cleviprex drip and admitted to neuro ICU ?  ?He subsequently had CT head repeated on 11/30, 12/1 and 12/3, all demonstrated unchanged intraparenchymal hemorrhage and intraventricular hemorrhage ?12/3, MRI brain from also showed numerous small acute infarcts in both cerebral hemisphere with minimal improvement in the right superior cerebellum ?12/3, intubated ?12/13, tracheostomy ?12/15, wean from vent to trach collar ?12/16, PEG tube placement ?12/16, repeat MRI showed an increase in the size and number of acute infarcts in the bilateral hemispheres with increased involvement in the cerebellum bilaterally.   ?12/18, transferred from neuro service to [facility]  ?12/21, palliative care consulted.  Family wanted to continue aggressive care as full CODE STATUS. ?12/23 developed Cheyne-Stokes respiratory pattern with respiratory alkalosis.  Transferred to ICU for short-term mechanical ventilation and was found to have new tracheobronchitis now on cefepime ?12/25 back to progressive floor and to TRH ?  ?His hospital course has been complicated by significant neurological impairment, aspiration events, AKI, hypernatremia, urinary retention. ?1/9 trach changed to #6.0 Shiley cuffless--1/10 > 30 days since trach inserted. ? 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  Staff educating family regarding the care of this patient including suctioning, administration of medications and tube feedings ? ? ? ? ? ?A physical therapy consult is indicated based on the patient?s mobility assessment. ? ? ?Mobility Assessment (last 72 hours)   ? ? Mobility Assessment   ? ? [place] Name 03/29/21 2000 03/29/21 1800 03/29/21 1400 03/29/21 1200 03/29/21 0800  ? Does patient have an order for bedrest or is patient medically unstable Yes- Bedfast (Level 1) - Complete Yes- Bedfast (Level 1) - Complete Yes- Bedfast (Level 1) - Complete Yes- Bedfast (Level 1) - Complete Yes- Bedfast (Level 1) - Complete  ? What is the highest level of mobility based on the progressive mobility assessment? Level 1 (Bedfast) - Unable to balance while sitting on edge of bed -- Level 1 (Bedfast) - Unable to balance while sitting on edge of bed Level 1 (Bedfast) - Unable to balance while sitting on edge of bed Level 1 (Bedfast) - Unable to balance while sitting on edge of bed  ? Is the above level different from baseline mobility prior to current illness? No - Consider discontinuing PT/OT -- Yes - Recommend PT order Yes - Recommend PT order Yes - Recommend PT order  ? ? [place] Name 03/28/21 2000 03/28/21 0800  ?  ?  ?  ? Does patient have an order for bedrest or is patient medically unstable Yes- Bedfast (Level 1) - Complete Yes- Bedfast (Level 1) - Complete     ? What is the highest level of mobility based on the progressive mobility assessment? Level 1 (Bedfast) -  Unable to balance while sitting on edge of bed Level 1 (Bedfast) - Unable to balance while sitting on edge of bed     ? Is the above level different from baseline mobility prior to current illness? No - Consider  discontinuing PT/OT No - Consider discontinuing PT/OT     ? ?  ?  ? ?  ? ? ? 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  Bedside nurse states secretions have thinned considerably since initiation ?To decreased from 50% to 28% with patient maintaining sats now 100% ?Continue cefepime for total of 7 days ?Improving noting decreased accretions and improvement in respiratory pattern ? ?Malignant hypertension ?Given ileus hold per tube Norvasc Coreg, hydralazine, and clonidine in favor of IV labetalol and a Apresoline ?Continue IV Lopressor to replace Coreg and to prevent beta-blocker withdrawal ?Transition to clonidine to topical ? ? ?Diabetes mellitus type 2 in obese (HCC) ?-A1c 6.3 in 12/31/2020 ?-Continue SSI every 4 hours ?-tradjenta  ?-CBGs well controlled but still requiring supplemental SSI so we will add low-dose Glucotrol ? ? ? ?  ? ?Acute kidney injury on CKD 3b/acute azotemia with mild hypernatremia ?BUN elevated at 55 with normal creatinine of 1.2 and a GFR greater than 60 ? ?hypernatremia ?Continue free water flushes of 300 cc every 4 hours  ?3/27 D5W at low rate added for persistent hypernatremia ?Continue to follow lab ? ? ?Anemia ?With hydration hemoglobin has decreased to 7.7 with an MCV of 79 ?Continue to follow ?Unclear if this is solely related to recent hydration or true reading ? ?Acute otitis media, right with mastoid effusion-resolved as of 04/30/2021 ?Patient with recent bilateral conjunctivitis that persisted over 2 weeks despite pharmacological therapy ?Maxillofacial CT revealed new right middle ear and mastoid opacification consistent with  otitis media with mastoid effusion ?Will need follow-up CT to evaluate for resolution.  If not resolved consideration should be given to ENT evaluation to determine if a candidate for myringotomy tube --Right TM unremarkable and without signs of infection when examined ? ? ?Ileus with hypokalemia ?Patient with significant ileus likely related to acute UTI with concomitant pneumonia ?Gastric tube placed to straight drain for greater than 24 hours ?Patient has not had any bowel movements as of yet.  Per staff, last recorded bowel movement was 3/30 ?Started on MiraLAX, will give tapwater enema and hold tube feeds until he has further bowel movements ? ?Acute conjunctivitis, bilateral ?Resolved ? ?Constipation ?Had additional episode of constipation that appeared to be consistent with SBO on plain films but CT confirmed significant colonic stool retention ?Laxatives resumed ? ?Protein-calorie malnutrition/dysphagia due to recent stroke/constipation ?Nutrition Problem: Inadequate oral intake ?Etiology: lethargy/confusion, dysphagia ?Signs/Symptoms: NPO status ?Interventions: Tube feeding via PEG ?Body mass index is 23.33 kg/m?Marland Kitchen  ?Continue Glucerna ? ?  ? ?Transaminitis ?- Reemergence of mild nonobstructive transaminitis with ALT currently elevated at 92-May be related to acute infectious process-unable to give per tube medications so Zyprexa on hold ?-Continue to follow lab ? ?Fever 2/2 Enterobacter UTI ? 3/28 Urine cx positve for > 100,000 colonies of Enterobacter -changed to cefepime 3/28 due to positive sputum (serratia)-continue for 7 days today is D#3 ? ?SBO ruled out/constipation-resolved as of 04/14/2021 ?Developed  emesis and abdominal distention on the afternoon of 3/9 ?Symptoms worsen.  KUB revealed SBO but CT revealed obstipation ?Symptoms resolved with resumption of laxatives ? ? ? ? ?Pneumonia due to Klebsiella pneumoniae (HCC)-resolved as of 04/01/2021 ?Resolved ? ?Pressure injury of skin-resolved as of  04/02/2021 ?Resolved-prior anus ulcer ? ?Acute urinary retention-resolved as of 03/30/2021 ?-Continue Urecholine ?-Foley catheter discontinued on 1/11 and patient voiding easily ? ?MSSA (methicillin susceptible Staphylococcus aureus

## 2021-05-02 NOTE — Progress Notes (Signed)
Tap water enema with administered at 2135. Very small amount of loose stool along with most of the enema contents observed exiting rectum. Pt cleaned up and oncoming RN updated. ?

## 2021-05-03 DIAGNOSIS — R5081 Fever presenting with conditions classified elsewhere: Secondary | ICD-10-CM | POA: Diagnosis not present

## 2021-05-03 DIAGNOSIS — J9600 Acute respiratory failure, unspecified whether with hypoxia or hypercapnia: Secondary | ICD-10-CM | POA: Diagnosis not present

## 2021-05-03 DIAGNOSIS — J156 Pneumonia due to other aerobic Gram-negative bacteria: Secondary | ICD-10-CM | POA: Diagnosis not present

## 2021-05-03 DIAGNOSIS — I6782 Cerebral ischemia: Secondary | ICD-10-CM | POA: Diagnosis not present

## 2021-05-03 LAB — CBC
HCT: 25 % — ABNORMAL LOW (ref 39.0–52.0)
Hemoglobin: 7.5 g/dL — ABNORMAL LOW (ref 13.0–17.0)
MCH: 23.4 pg — ABNORMAL LOW (ref 26.0–34.0)
MCHC: 30 g/dL (ref 30.0–36.0)
MCV: 78.1 fL — ABNORMAL LOW (ref 80.0–100.0)
Platelets: 207 10*3/uL (ref 150–400)
RBC: 3.2 MIL/uL — ABNORMAL LOW (ref 4.22–5.81)
RDW: 16.8 % — ABNORMAL HIGH (ref 11.5–15.5)
WBC: 15 10*3/uL — ABNORMAL HIGH (ref 4.0–10.5)
nRBC: 0 % (ref 0.0–0.2)

## 2021-05-03 LAB — GLUCOSE, CAPILLARY
Glucose-Capillary: 80 mg/dL (ref 70–99)
Glucose-Capillary: 81 mg/dL (ref 70–99)
Glucose-Capillary: 85 mg/dL (ref 70–99)
Glucose-Capillary: 87 mg/dL (ref 70–99)
Glucose-Capillary: 88 mg/dL (ref 70–99)
Glucose-Capillary: 94 mg/dL (ref 70–99)
Glucose-Capillary: 95 mg/dL (ref 70–99)

## 2021-05-03 LAB — RENAL FUNCTION PANEL
Albumin: 2.2 g/dL — ABNORMAL LOW (ref 3.5–5.0)
Anion gap: 6 (ref 5–15)
BUN: 27 mg/dL — ABNORMAL HIGH (ref 6–20)
CO2: 21 mmol/L — ABNORMAL LOW (ref 22–32)
Calcium: 9.6 mg/dL (ref 8.9–10.3)
Chloride: 120 mmol/L — ABNORMAL HIGH (ref 98–111)
Creatinine, Ser: 1.27 mg/dL — ABNORMAL HIGH (ref 0.61–1.24)
GFR, Estimated: >60 mL/min (ref >60)
Glucose, Bld: 94 mg/dL (ref 70–99)
Phosphorus: 2.9 mg/dL (ref 2.5–4.6)
Potassium: 3.9 mmol/L (ref 3.5–5.1)
Sodium: 147 mmol/L — ABNORMAL HIGH (ref 135–145)

## 2021-05-03 MED ORDER — MAGNESIUM HYDROXIDE 400 MG/5ML PO SUSP
30.0000 mL | ORAL | Status: AC
  Administered 2021-05-03: 30 mL via ORAL
  Filled 2021-05-03: qty 30

## 2021-05-03 NOTE — Progress Notes (Signed)
?Progress Note ? ? ?Patient: Stephen Delgado ZOX:[id number] DOB: 1965/09/24 DOA: 12/30/2020     124 ?DOS: the patient was seen and examined on 05/03/2021 ?  ?Brief hospital course: ?56 y.o. male with PMH significant for DM2, HTN, CKD with questionable compliance to medications ?Patient presented to the ED on 12/30/2020 with complaint of headache, altered mental status ?  ?In the ED, he was hypertensive to 238/160, agitated, required restraints ?CT head showed large amount of intraventricular hemorrhage involving lateral/third/fourth ventricle, ICH and left basal ganglia as well as chronic microvascular ischemic changes. ?He was started on Cleviprex drip and admitted to neuro ICU ?  ?He subsequently had CT head repeated on 11/30, 12/1 and 12/3, all demonstrated unchanged intraparenchymal hemorrhage and intraventricular hemorrhage ?12/3, MRI brain from also showed numerous small acute infarcts in both cerebral hemisphere with minimal improvement in the right superior cerebellum ?12/3, intubated ?12/13, tracheostomy ?12/15, wean from vent to trach collar ?12/16, PEG tube placement ?12/16, repeat MRI showed an increase in the size and number of acute infarcts in the bilateral hemispheres with increased involvement in the cerebellum bilaterally.   ?12/18, [facility]  ?12/21, palliative care consulted.  Family wanted to continue aggressive care as full CODE STATUS. ?12/23 developed Cheyne-Stokes respiratory pattern with respiratory alkalosis.  Transferred to ICU for short-term mechanical ventilation and was found to have new tracheobronchitis now on cefepime ?12/25 back to progressive floor and to TRH ?  ?His hospital course has been complicated by significant neurological impairment, aspiration events, AKI, hypernatremia, urinary retention. ?1/9 trach changed to #6.0 Shiley cuffless--1/10 > 30 days since trach inserted. ? 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  Staff educating family regarding the care of this patient including suctioning, administration of medications and tube feedings ? ? ? ? ? ?A physical therapy consult is indicated based on the patient?s mobility assessment. ? ? ?Mobility Assessment (last 72 hours)   ? ? Mobility Assessment   ? ? Row Name 03/29/21 2000 03/29/21 1800 03/29/21 1400 03/29/21 1200 03/29/21 0800  ? Does patient have an order for bedrest or is patient medically unstable Yes- Bedfast (Level 1) - Complete Yes- Bedfast (Level 1) - Complete Yes- Bedfast (Level 1) - Complete Yes- Bedfast (Level 1) - Complete Yes- Bedfast (Level 1) - Complete  ? What is the highest level of mobility based on the progressive mobility assessment? Level 1 (Bedfast) - Unable to balance while sitting on edge of bed -- Level 1 (Bedfast) - Unable to balance while sitting on edge of bed Level 1 (Bedfast) - Unable to balance while sitting on edge of bed Level 1 (Bedfast) - Unable to balance while sitting on edge of bed  ? Is the above level different from baseline mobility prior to current illness? No - Consider discontinuing PT/OT -- Yes - Recommend PT order Yes - Recommend PT order Yes - Recommend PT order  ? ? Row Name 03/28/21 2000 03/28/21 0800  ?  ?  ?  ? Does patient have an order for bedrest or is patient medically unstable Yes- Bedfast (Level 1) - Complete Yes- Bedfast (Level 1) - Complete     ? What is the highest level of mobility based on the progressive mobility assessment? Level 1 (Bedfast) -  Unable to balance while sitting on edge of bed Level 1 (Bedfast) - Unable to balance while sitting on edge of bed     ? Is the above level different from baseline mobility prior to current illness? No - Consider  discontinuing PT/OT No - Consider discontinuing PT/OT     ? ?  ?  ? ?  ? ? ? 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  Bedside nurse states secretions have thinned considerably since initiation ?To decreased from 50% to 28% with patient maintaining sats now 100% ?Continue cefepime for total of 7 days ?Improving noting decreased accretions and improvement in respiratory pattern ? ?Malignant hypertension ?Given ileus hold per tube Norvasc Coreg, hydralazine, and clonidine in favor of IV labetalol and a Apresoline ?Continue IV Lopressor to replace Coreg and to prevent beta-blocker withdrawal ?Transition to clonidine to topical ? ? ?Diabetes mellitus type 2 in obese (HCC) ?-A1c 6.3 in 12/31/2020 ?-Continue SSI every 4 hours ?-tradjenta  ?-CBGs well controlled but still requiring supplemental SSI so we will add low-dose Glucotrol ? ? ? ?  ? ?Acute kidney injury on CKD 3b/acute azotemia with mild hypernatremia ?BUN elevated at 55 with normal creatinine of 1.2 and a GFR greater than 60 ? ?hypernatremia ?Continue free water flushes of 300 cc every 4 hours  ?3/27 D5W at low rate added for persistent hypernatremia ?Continue to follow lab ? ? ?Anemia ?With hydration hemoglobin has decreased to 7.7 with an MCV of 79 ?Continue to follow ?Unclear if this is solely related to recent hydration or true reading ? ?Acute otitis media, right with mastoid effusion-resolved as of 04/30/2021 ?Patient with recent bilateral conjunctivitis that persisted over 2 weeks despite pharmacological therapy ?Maxillofacial CT revealed new right middle ear and mastoid opacification consistent with  otitis media with mastoid effusion ?Will need follow-up CT to evaluate for resolution.  If not resolved consideration should be given to ENT evaluation to determine if a candidate for myringotomy tube --Right TM unremarkable and without signs of infection when examined ? ? ?Ileus with hypokalemia ?Patient with significant ileus likely related to acute UTI with concomitant pneumonia ?Gastric tube placed to straight drain for greater than 24 hours ?Patient has not had any bowel movements as of yet.  Per staff, last recorded bowel movement was 3/30 ?Started on MiraLAX, will give tapwater enema and hold tube feeds until he has further bowel movements ?We will also give a dose of milk of mag today since he has not had any significant bowel movements as of yet ? ?Acute conjunctivitis, bilateral ?Resolved ? ?Constipation ?Had additional episode of constipation that appeared to be consistent with SBO on plain films but CT confirmed significant colonic stool retention ?Laxatives resumed ? ?Protein-calorie malnutrition/dysphagia due to recent stroke/constipation ?Nutrition Problem: Inadequate oral intake ?Etiology: lethargy/confusion, dysphagia ?Signs/Symptoms: NPO status ?Interventions: Tube feeding via PEG ?Body mass index is 23.33 kg/m?Marland Kitchen  ?Continue Glucerna ? ?  ? ?Transaminitis ?- Reemergence of mild nonobstructive transaminitis with ALT currently elevated at 92-May be related to acute infectious process-unable to give per tube medications so Zyprexa on hold ?-Continue to follow lab ? ?Fever 2/2 Enterobacter UTI ? 3/28 Urine cx positve for > 100,000 colonies of Enterobacter -  changed to cefepime 3/28 due to positive sputum (serratia)-continue for 7 days today is D#3 ? ?SBO ruled out/constipation-resolved as of 04/14/2021 ?Developed emesis and abdominal distention on the afternoon of 3/9 ?Symptoms worsen.  KUB revealed SBO but CT revealed obstipation ?Symptoms resolved with resumption of laxatives ? ? ? ? ?Pneumonia due to  Klebsiella pneumoniae (HCC)-resolved as of 04/01/2021 ?Resolved ? ?Pressure injury of skin-resolved as of 04/02/2021 ?Resolved-prior anus ulcer ? ?Acute urinary retention-resolved as of 03/30/2021 ?-Continue Urecholine ?-Foley c

## 2021-05-03 NOTE — Progress Notes (Signed)
Patient received tap water enema overnight.  No bowel movements noted.  Patient continues to experience gas and seems to try to belch at times.  ?

## 2021-05-04 ENCOUNTER — Inpatient Hospital Stay (HOSPITAL_COMMUNITY): Payer: 59

## 2021-05-04 LAB — RENAL FUNCTION PANEL
Albumin: 2.1 g/dL — ABNORMAL LOW (ref 3.5–5.0)
Anion gap: 9 (ref 5–15)
BUN: 30 mg/dL — ABNORMAL HIGH (ref 6–20)
CO2: 17 mmol/L — ABNORMAL LOW (ref 22–32)
Calcium: 9.6 mg/dL (ref 8.9–10.3)
Chloride: 120 mmol/L — ABNORMAL HIGH (ref 98–111)
Creatinine, Ser: 1.36 mg/dL — ABNORMAL HIGH (ref 0.61–1.24)
GFR, Estimated: >60 mL/min (ref >60)
Glucose, Bld: 91 mg/dL (ref 70–99)
Phosphorus: 3.3 mg/dL (ref 2.5–4.6)
Potassium: 3.4 mmol/L — ABNORMAL LOW (ref 3.5–5.1)
Sodium: 146 mmol/L — ABNORMAL HIGH (ref 135–145)

## 2021-05-04 LAB — GLUCOSE, CAPILLARY
Glucose-Capillary: 103 mg/dL — ABNORMAL HIGH (ref 70–99)
Glucose-Capillary: 118 mg/dL — ABNORMAL HIGH (ref 70–99)
Glucose-Capillary: 138 mg/dL — ABNORMAL HIGH (ref 70–99)
Glucose-Capillary: 87 mg/dL (ref 70–99)
Glucose-Capillary: 90 mg/dL (ref 70–99)
Glucose-Capillary: 92 mg/dL (ref 70–99)

## 2021-05-04 LAB — CBC
HCT: 22.9 % — ABNORMAL LOW (ref 39.0–52.0)
Hemoglobin: 6.9 g/dL — CL (ref 13.0–17.0)
MCH: 23.6 pg — ABNORMAL LOW (ref 26.0–34.0)
MCHC: 30.1 g/dL (ref 30.0–36.0)
MCV: 78.4 fL — ABNORMAL LOW (ref 80.0–100.0)
Platelets: 207 10*3/uL (ref 150–400)
RBC: 2.92 MIL/uL — ABNORMAL LOW (ref 4.22–5.81)
RDW: 16.8 % — ABNORMAL HIGH (ref 11.5–15.5)
WBC: 13.6 10*3/uL — ABNORMAL HIGH (ref 4.0–10.5)
nRBC: 0 % (ref 0.0–0.2)

## 2021-05-04 LAB — RETICULOCYTES
Immature Retic Fract: 23.8 % — ABNORMAL HIGH (ref 2.3–15.9)
RBC.: 3.11 MIL/uL — ABNORMAL LOW (ref 4.22–5.81)
Retic Count, Absolute: 57.2 10*3/uL (ref 19.0–186.0)
Retic Ct Pct: 1.8 % (ref 0.4–3.1)

## 2021-05-04 LAB — ABO/RH: ABO/RH(D): B POS

## 2021-05-04 LAB — PREPARE RBC (CROSSMATCH)

## 2021-05-04 MED ORDER — IOHEXOL 300 MG/ML  SOLN
100.0000 mL | Freq: Once | INTRAMUSCULAR | Status: AC | PRN
  Administered 2021-05-04: 100 mL via INTRAVENOUS

## 2021-05-04 MED ORDER — KCL IN DEXTROSE-NACL 20-5-0.45 MEQ/L-%-% IV SOLN
INTRAVENOUS | Status: DC
  Filled 2021-05-04: qty 1000

## 2021-05-04 MED ORDER — SODIUM CHLORIDE 0.9% IV SOLUTION: Freq: Once | INTRAVENOUS | Status: AC

## 2021-05-04 MED ORDER — IOHEXOL 9 MG/ML PO SOLN
ORAL | Status: AC
Start: 2021-05-04 — End: 2021-05-04
  Administered 2021-05-04: 500 mL
  Filled 2021-05-04: qty 1000

## 2021-05-04 MED ORDER — PROSOURCE TF PO LIQD
45.0000 mL | Freq: Two times a day (BID) | ORAL | Status: DC
  Administered 2021-05-04 – 2021-05-22 (×30): 45 mL
  Filled 2021-05-04 (×31): qty 45

## 2021-05-04 MED ORDER — HALOPERIDOL LACTATE 5 MG/ML IJ SOLN: 2.0000 mg | Freq: Once | INTRAMUSCULAR | Status: DC

## 2021-05-04 MED ORDER — SODIUM BICARBONATE 8.4 % IV SOLN
INTRAVENOUS | Status: DC
  Filled 2021-05-04: qty 1000

## 2021-05-04 MED ORDER — OSMOLITE 1.5 CAL PO LIQD
1000.0000 mL | ORAL | Status: DC
  Administered 2021-05-04 – 2021-05-07 (×3): 1000 mL
  Filled 2021-05-04 (×2): qty 1000

## 2021-05-04 NOTE — Progress Notes (Signed)
[facility] [id number]) Hospital Liaison note: ? ?This patient has been referred to [facility] services. Will continue to follow for disposition. ? ?Please call with any outpatient palliative questions or concerns. ? ?Thank you for the opportunity to participate in this patient's care. ?  ?Thank you, ?Abran Cantor, LPN ?[facility] Hospital Liaison ?[phone] ?

## 2021-05-04 NOTE — Progress Notes (Signed)
Nutrition Follow-up ? ?DOCUMENTATION CODES:  ?Severe malnutrition in context of chronic illness ? ?INTERVENTION:  ?Obtain weights weekly  ? ?Continue TF via PEG: ?-Transition to Osmolite 1.5 @ 15ml/hr, increase per MD/NP to goal rate of 60ml/hr (recommend increasing by 10ml/hr Q8H until goal rate is reached) ?-45ml Prosource TF BID ? ?At goal, TF regimen will provide 2240 kcals, 112 grams protein, 1097ml free water ? ?NUTRITION DIAGNOSIS:  ?Severe Malnutrition related to chronic illness as evidenced by severe muscle depletion, severe fat depletion. -- ongoing ? ?GOAL:  ?Patient will meet greater than or equal to 90% of their needs -- addressing with TF, met when TF at goal ? ?MONITOR:  ?TF tolerance, Labs, I & O's, Skin ? ?REASON FOR ASSESSMENT:  ?Ventilator, Consult ?Enteral/tube feeding initiation and management ? ?ASSESSMENT:  ?55-year-old male who presented to the ED on 11/29 with AMS. PMH of HTN, T2DM, CKD stage III. 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 ? ?11/30 cortrak placed; tip gastric ?12/2 trickle TF ?12/3 pt intubated due to increased WOB, +emesis, TF held ?12/4 trickle TF restarted ?12/5 advancement orders placed ?12/9 TF adjusted ?12/13 s/p tracheostomy ?12/16 s/p EGD and PEG placement ?12/19 tx out of ICU to TRH  ?12/23 developed Cheyne-Stokes respiratory pattern with respiratory alkalosis.Transferred to ICU for short-term mechanical ventilation and was found to have new tracheobronchitis now on cefepime ?12/25 tx back to TRH ?1/09 trach changed to #6 shiley cuffless  ?1/10 marks 30 days since trach placed ?2/17 trach changed   ?3/03 trach changed  ?3/04 SBO resolved ? ?Pt continues to be minimally responsive, tolerating ATC and continues to be NPO requiring TF via PEG. Pt to d/c home with home health once medically stable. Discussed pt with NP as pt noted to have began experiencing constipation, nausea, and vomiting again. Constipation not  relieved by miralax nor enemas. Concerned pt unable to tolerate Glucerna given ongoing issues with intermittent N/V/GI distress and plan to transition back to Osmolite in hopes of improving tolerance. NP has also adjusted pt's medication regimen due to concerns for GI distress. Will monitor closely as pt previously with poorly controlled CBGs when on Osmolite. NP aware and agreeable to plan. Will begin pt at trickle TF rate and slowly titrate to goal.  ? ?Current TF: Glucerna 1.5 @ 60ml/hr w/ 200ml free water Q4H ?  ?UOP: 2050ml x24 hours ?I/O: +13255ml since admit ? ?Admit wt 85.2 kg ?Current wt 111 kg (updated 3/28) ?  ?Medications: reglan, miralax, SSI Q4H, IV abx ?IVF: D5-1/2NS @ 100ml/hr  ?Labs: Na 146, K+ 3.4, hgb 6.9 ?CBGs: 80-103 x 24 hours ? ?Diet Order:   ?Diet Order   ? ?       ?  Diet NPO time specified  Diet effective midnight       ?  ? ?  ?  ? ?  ? ?EDUCATION NEEDS:  ?Not appropriate for education at this time ? ?Skin:  Skin Assessment: Reviewed RN Assessment ?Skin Integrity Issues:: Stage II ?Stage II: anus ? ?Last BM:  3/30 ? ?Height:  ?Ht Readings from Last 1 Encounters:  ?01/23/21 6' (1.829 m)  ? ?Weight:  ?Wt Readings from Last 1 Encounters:  ?04/28/21 111.1 kg  ? ?BMI:  Body mass index is 33.23 kg/m?. ? ?Estimated Nutritional Needs:  ?Kcal:  2150-2350 ?Protein:  105-120 grams ?Fluid:  >2L ? ? ? A., MS, RD, LDN (she/her/hers) ?RD pager number and weekend/on-call pager number located in Amion. ? ?

## 2021-05-04 NOTE — Progress Notes (Addendum)
?Progress Note ?  ?  ?Patient: Stephen Delgado [id number] DOB: 1965/09/25 DOA: 12/30/2020     122 ?DOS: the patient was seen and examined on 05/01/2021 ?  ?  ?  ?Brief hospital course: ?56 y.o. male with PMH significant for DM2, HTN, CKD with questionable compliance to medications ?Patient presented to the ED on 12/30/2020 with complaint of headache, altered mental status ?  ?In the ED, he was hypertensive to 238/160, agitated, required restraints ?CT head showed large amount of intraventricular hemorrhage involving lateral/third/fourth ventricle, ICH and left basal ganglia as well as chronic microvascular ischemic changes. ?He was started on Cleviprex drip and admitted to neuro ICU ?  ?He subsequently had CT head repeated on 11/30, 12/1 and 12/3, all demonstrated unchanged intraparenchymal hemorrhage and intraventricular hemorrhage ?12/3, MRI brain from also showed numerous small acute infarcts in both cerebral hemisphere with minimal improvement in the right superior cerebellum ?12/3, intubated ?12/13, tracheostomy ?12/15, wean from vent to trach collar ?12/16, PEG tube placement ?12/16, repeat MRI showed an increase in the size and number of acute infarcts in the bilateral hemispheres with increased involvement in the cerebellum bilaterally.   ?12/18, transferred from neuro service to [facility]  ?12/21, palliative care consulted.  Family wanted to continue aggressive care as full CODE STATUS. ?12/23 developed Cheyne-Stokes respiratory pattern with respiratory alkalosis.  Transferred to ICU for short-term mechanical ventilation and was found to have new tracheobronchitis now on cefepime ?12/25 back to progressive floor and to TRH ?  ?His hospital course has been complicated by significant neurological impairment, aspiration events, AKI, hypernatremia, urinary retention. ?1/9 trach changed to #6.0 Shiley cuffless--1/10 > 30 days since trach inserted. ?  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  Staff educating family regarding the care of this patient including suctioning, administration of medications and tube feedings ?  ?  ? ?A physical therapy consult is indicated based on the patient?s mobility assessment. ?  ?  ?Mobility Assessment (last 72 hours)   ?  ?  Mobility Assessment   ?  ?  [place] Name 03/29/21 2000 03/29/21 1800 03/29/21 1400 03/29/21 1200 03/29/21 0800  ?  Does patient have an order for bedrest or is patient medically unstable Yes- Bedfast (Level 1) - Complete Yes- Bedfast (Level 1) - Complete Yes- Bedfast (Level 1) - Complete Yes- Bedfast (Level 1) - Complete Yes- Bedfast (Level 1) - Complete  ?  What is the highest level of mobility based on the progressive mobility assessment? Level 1 (Bedfast) - Unable to balance while sitting on edge of bed -- Level 1 (Bedfast) - Unable to balance while sitting on edge of bed Level 1 (Bedfast) - Unable to balance while sitting on edge of bed Level 1 (Bedfast) - Unable to balance while sitting on edge of bed  ?  Is the above level different from baseline mobility prior to current illness? No - Consider discontinuing PT/OT -- Yes - Recommend PT order Yes - Recommend PT order Yes - Recommend PT order  ?  ?  [place] Name 03/28/21 2000 03/28/21 0800   ?    ?    ?   ?  Does patient have an order for bedrest or is patient medically unstable Yes- Bedfast (Level 1) - Complete Yes- Bedfast (  Level 1) - Complete        ?  What is the highest level of mobility based on the progressive mobility assessment? Level 1 (Bedfast) - Unable to balance while sitting on edge of bed Level 1 (Bedfast) - Unable to balance while sitting on edge of bed        ?  Is the above level different from baseline mobility prior to current  illness? No - Consider discontinuing PT/OT No - Consider discontinuing PT/OT        ?  ?   ?  ?  ?   ?  ?  ?  ?Ileus with hypokalemia ?Patient with significant ileus likely related to acute UTI with concomitant pneumonia ?Gastric tube placed to straight drain for greater than 24 hours ?Follow-up KUB on 3/31 reveals resolution of ileus ?Resume tube feedings at 15 cc/h.  Per my discussion with RD recommendation is to change tube feedings.  We will start with Osmolite and see how he tolerates. ?  ?Acute otitis media, right with mastoid effusion-resolved as of 04/30/2021 ?Patient with recent bilateral conjunctivitis that persisted over 2 weeks despite pharmacological therapy ?Maxillofacial CT revealed new right middle ear and mastoid opacification consistent with otitis media with mastoid effusion ?Will need follow-up CT to evaluate for resolution.  If not resolved consideration should be given to ENT evaluation to determine if a candidate for myringotomy tube --Right TM unremarkable and without signs of infection when examined.  Potassium on 4/2 slightly low at 3.4 and patient does have mild metabolic acidosis with a CO2 of 17.  If persists after tube feeding resumed on 4/2 may require a bicarbonate drip.  IV fluids initiated with potassium on 4/3. ? ?Anemia ?As of 4/3 hgb down to 6.9 ?Ck anemia pnl and transfuse 2 units PRBCs-wife at bedside and consent obtained for transfusion of blood products ?Ck CB in am ?  ?Enterobacter UTI ? 3/28 Urine cx positve for > 100,000 colonies of Enterobacter -changed to cefepime 3/28 due to positive sputum (serratia)-continue for 7 days  ?  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  Bedside nurse states secretions have thinned considerably since initiation ?To decreased from 50% to 28% with patient maintaining sats now 100% ?Continue cefepime for total of 7 days ?  ?Malignant hypertension ?Given ileus hold per tube Norvasc Coreg, hydralazine, and clonidine in favor of IV labetalol and a Apresoline ?Continue IV Lopressor to replace Coreg and to prevent beta-blocker withdrawal ?Transition to clonidine to topical ?  ?  ?Diabetes mellitus type 2 in obese (HCC) ?-A1c 6.3 in 12/31/2020 ?-Continue SSI every 4 hours ?-tradjenta  ?-CBGs well controlled but still requiring supplemental SSI so we will add low-dose Glucotrol ?  ?Hypernatremia ?Hold free water given continued ileus symptoms with associated nausea and vomiting ?I have added D5 half-normal saline as maintenance fluid ?Last sodium was 146 on 4/3 ?  ?Acute conjunctivitis, bilateral ?Resolved ?  ?Pneumonia due to Klebsiella pneumoniae  ?Resolved ?  ?Protein-calorie malnutrition/dysphagia due to recent stroke/constipation ?Nutrition Problem: Inadequate oral intake ?Etiology: lethargy/confusion, dysphagia ?Signs/Symptoms: NPO status ?Interventions: Tube feeding via PEG hold due to recurrent nausea and vomiting and ileus type symptoms ?Body mass index is  23.33 kg/m?Marland Kitchen  ?Continue Glucerna ?  ?Acute urinary retention ?-Continue Urecholine ?-Foley catheter discontinued on 1/11 and patient voiding easily ?  ?Transaminitis ?- Reemergence of mild nonobstructive transaminitis with ALT currently elevated at 92-May be related to acute infectious process-unable to give per tube medications so Zyprexa on hold ?-Continue to follow lab ?  ?MSSA (methicillin susceptible Staphylococcus aureus) pneumonia ?Resolved ?  ?Pressure injury of skin ?Resolved-prior anus ulcer ?  ?Pneumonia due to Serratia marcescens ?Resolved ?  ?Acute kidney injury on CKD 3b/acute azotemia with mild hypernatremia ?BUN elevated at 55 with  normal creatinine of 1.2 and a GFR greater than 60 ?  ? ?  ?  ?Subjective:  ?Unresponsive but did open eyes to voice today ?  ?Physical Exam: ?      ?Vitals:  ?  05/01/21 0841 05/01/21 0844 05/01/21 1135 03/31/

## 2021-05-04 NOTE — TOC Progression Note (Signed)
Transition of Care (TOC) - Progression Note  ? ? ?Patient Details  ?Name: Stephen Delgado ?MRN: [id number] ?Date of Birth: 06/18/1965 ? ?Transition of Care (TOC) CM/SW Contact  ? R Stubbldfield, RN ?Phone Number: ?05/04/2021, 11:03 AM ? ?Clinical Narrative:    ?CM met with the patient and wife at the bedside to discuss pending discharge to home with family at a later date - once patient is medically stable for discharge and private duty approved for [facility] services - pending approval.  I spoke with Donna Hiscock, RNCM with [employer] insurance and she states that the approval for private duty availability for home is still pending approval. ? ?I spoke with the patient's wife at the bedside along with primary care RN and encouraged the patient's wife to continue education with RN staff and RT to receive educating relating to skin care, tube feeding and medication management through gastric tube along with tracheostomy care and management of airway for home.  [name] will be on vacation from work starting 05/13/2021 and plans on taking a week off from work and plans to follow up with HR and her supervisor and likely need for FMLA through [employer].  [name] states that she has retirement savings as well that may be able to assist with expenses as needed at a later date. ? ?[name] states that she plans to continue teaching with bedside nursing daily until the patient is able to discharge to home.  [name] is aware that medical dme will be provided through [employer] and delivery will occur closer to discharge to home date. ? ?CM and MSW with DTP Team will continue to follow the patient for discharge needs. ? ? ?Expected Discharge Plan: Home w Home Health Services ?Barriers to Discharge: Continued Medical Work up, No SNF bed ? ?Expected Discharge Plan and Services ?Expected Discharge Plan: Home w Home Health Services ?In-house Referral: Clinical Social Work, Hospice /  Palliative Care, PCP / Health Connect ?Discharge Planning Services: CM Consult ?Post Acute Care Choice: Home Health ?Living arrangements for the past 2 months: Single Family Home ?                ?DME Arranged: Hospital bed, Trach supplies, Tube feeding, Oxygen ?DME Agency: [employer] ?Date DME Agency Contacted: 03/18/21 ?Time DME Agency Contacted: 1000 ?Representative spoke with at DME Agency: Zack Blank, CM with [employer] ?  ?  ?  ?  ?  ? ? ?Social Determinants of Health (SDOH) Interventions ?  ? ?Readmission Risk Interventions ? ?  02/20/2021  ?  9:31 AM  ?Readmission Risk Prevention Plan  ?Transportation Screening Complete  ?Medication Review (RN Care Manager) Complete  ?PCP or Specialist appointment within 3-5 days of discharge Not Complete  ?PCP/Specialist Appt Not Complete comments Patient will need LTC placement at SNF facility.  ?HRI or Home Care Consult Complete  ?SW Recovery Care/Counseling Consult Complete  ?Palliative Care Screening Complete  ?Skilled Nursing Facility Complete  ? ? ?

## 2021-05-05 ENCOUNTER — Inpatient Hospital Stay (HOSPITAL_COMMUNITY): Payer: 59

## 2021-05-05 DIAGNOSIS — E876 Hypokalemia: Secondary | ICD-10-CM

## 2021-05-05 DIAGNOSIS — E872 Acidosis, unspecified: Secondary | ICD-10-CM

## 2021-05-05 LAB — BPAM RBC
Blood Product Expiration Date: 202304202359
Blood Product Expiration Date: 202304212359
ISSUE DATE / TIME: 202304031325
ISSUE DATE / TIME: 202304031633
Unit Type and Rh: 7300
Unit Type and Rh: 7300

## 2021-05-05 LAB — IRON AND TIBC
Iron: 32 ug/dL — ABNORMAL LOW (ref 45–182)
Saturation Ratios: 21 % (ref 17.9–39.5)
TIBC: 155 ug/dL — ABNORMAL LOW (ref 250–450)
UIBC: 123 ug/dL

## 2021-05-05 LAB — BASIC METABOLIC PANEL
Anion gap: 8 (ref 5–15)
BUN: 33 mg/dL — ABNORMAL HIGH (ref 6–20)
CO2: 19 mmol/L — ABNORMAL LOW (ref 22–32)
Calcium: 9.8 mg/dL (ref 8.9–10.3)
Chloride: 122 mmol/L — ABNORMAL HIGH (ref 98–111)
Creatinine, Ser: 1.29 mg/dL — ABNORMAL HIGH (ref 0.61–1.24)
GFR, Estimated: >60 mL/min (ref >60)
Glucose, Bld: 112 mg/dL — ABNORMAL HIGH (ref 70–99)
Potassium: 3.3 mmol/L — ABNORMAL LOW (ref 3.5–5.1)
Sodium: 149 mmol/L — ABNORMAL HIGH (ref 135–145)

## 2021-05-05 LAB — CBC WITH DIFFERENTIAL/PLATELET
Abs Immature Granulocytes: 0.16 10*3/uL — ABNORMAL HIGH (ref 0.00–0.07)
Basophils Absolute: 0.1 10*3/uL (ref 0.0–0.1)
Basophils Relative: 0 %
Eosinophils Absolute: 0.3 10*3/uL (ref 0.0–0.5)
Eosinophils Relative: 2 %
HCT: 31.2 % — ABNORMAL LOW (ref 39.0–52.0)
Hemoglobin: 10 g/dL — ABNORMAL LOW (ref 13.0–17.0)
Immature Granulocytes: 1 %
Lymphocytes Relative: 9 %
Lymphs Abs: 1.4 10*3/uL (ref 0.7–4.0)
MCH: 24.5 pg — ABNORMAL LOW (ref 26.0–34.0)
MCHC: 32.1 g/dL (ref 30.0–36.0)
MCV: 76.5 fL — ABNORMAL LOW (ref 80.0–100.0)
Monocytes Absolute: 1.3 10*3/uL — ABNORMAL HIGH (ref 0.1–1.0)
Monocytes Relative: 8 %
Neutro Abs: 13.1 10*3/uL — ABNORMAL HIGH (ref 1.7–7.7)
Neutrophils Relative %: 80 %
Platelets: 187 10*3/uL (ref 150–400)
RBC: 4.08 MIL/uL — ABNORMAL LOW (ref 4.22–5.81)
RDW: 16 % — ABNORMAL HIGH (ref 11.5–15.5)
WBC: 16.3 10*3/uL — ABNORMAL HIGH (ref 4.0–10.5)
nRBC: 0 % (ref 0.0–0.2)

## 2021-05-05 LAB — TYPE AND SCREEN
ABO/RH(D): B POS
Antibody Screen: NEGATIVE
Unit division: 0
Unit division: 0

## 2021-05-05 LAB — GLUCOSE, CAPILLARY
Glucose-Capillary: 116 mg/dL — ABNORMAL HIGH (ref 70–99)
Glucose-Capillary: 173 mg/dL — ABNORMAL HIGH (ref 70–99)
Glucose-Capillary: 156 mg/dL — ABNORMAL HIGH (ref 70–99)
Glucose-Capillary: 147 mg/dL — ABNORMAL HIGH (ref 70–99)
Glucose-Capillary: 145 mg/dL — ABNORMAL HIGH (ref 70–99)

## 2021-05-05 LAB — VITAMIN B12: Vitamin B-12: 1633 pg/mL — ABNORMAL HIGH (ref 180–914)

## 2021-05-05 LAB — FERRITIN: Ferritin: 1070 ng/mL — ABNORMAL HIGH (ref 24–336)

## 2021-05-05 LAB — FOLATE: Folate: 12.2 ng/mL (ref >5.9)

## 2021-05-05 LAB — PHOSPHORUS: Phosphorus: 2.4 mg/dL — ABNORMAL LOW (ref 2.5–4.6)

## 2021-05-05 MED ORDER — DEXTROSE 5 % IV SOLN: INTRAVENOUS | Status: DC

## 2021-05-05 MED ORDER — POTASSIUM CHLORIDE 20 MEQ PO PACK
40.0000 meq | PACK | Freq: Two times a day (BID) | ORAL | Status: DC
  Administered 2021-05-05 (×2): 40 meq
  Filled 2021-05-05 (×2): qty 2

## 2021-05-05 MED ORDER — CARVEDILOL 12.5 MG PO TABS
12.5000 mg | ORAL_TABLET | Freq: Two times a day (BID) | ORAL | Status: DC
  Administered 2021-05-05 – 2021-05-07 (×6): 12.5 mg via ORAL
  Filled 2021-05-05 (×8): qty 1

## 2021-05-05 MED ORDER — SODIUM BICARBONATE 650 MG PO TABS
650.0000 mg | ORAL_TABLET | Freq: Two times a day (BID) | ORAL | Status: DC
  Administered 2021-05-05 – 2021-05-07 (×5): 650 mg
  Filled 2021-05-05 (×5): qty 1

## 2021-05-05 NOTE — Progress Notes (Signed)
Patient have CBC blood to be drawn 2 hours post transfusion due at 2130. The phlebotomist called me and said she will come back to get the blood. Checked the results from time to time if it has resulted. Called back the lab just now to confirmed if cbc was drawn, she said she assumed that it was already drawned by someone else because it wasn't showing on her screen anymore. Called to the main lab to confirmed if they received the cbc drawn for the patient, they said no blood was sent down. The phlebotomist asked to come draw patient blood per order. Charge nurse aware. ?

## 2021-05-05 NOTE — Progress Notes (Signed)
?Progress Note ?  ?  ?Patient: Stephen Delgado [id number] DOB: April 10, 1965 DOA: 12/30/2020     122 ?DOS: the patient was seen and examined on 05/01/2021 ?  ?  ?  ?Brief hospital course: ?56 y.o. male with PMH significant for DM2, HTN, CKD with questionable compliance to medications ?Patient presented to the ED on 12/30/2020 with complaint of headache, altered mental status ?  ?In the ED, he was hypertensive to 238/160, agitated, required restraints ?CT head showed large amount of intraventricular hemorrhage involving lateral/third/fourth ventricle, ICH and left basal ganglia as well as chronic microvascular ischemic changes. ?He was started on Cleviprex drip and admitted to neuro ICU ?  ?He subsequently had CT head repeated on 11/30, 12/1 and 12/3, all demonstrated unchanged intraparenchymal hemorrhage and intraventricular hemorrhage ?12/3, MRI brain from also showed numerous small acute infarcts in both cerebral hemisphere with minimal improvement in the right superior cerebellum ?12/3, intubated ?12/13, tracheostomy ?12/15, wean from vent to trach collar ?12/16, PEG tube placement ?12/16, repeat MRI showed an increase in the size and number of acute infarcts in the bilateral hemispheres with increased involvement in the cerebellum bilaterally.   ?12/18, transferred from neuro service to [facility]  ?12/21, palliative care consulted.  Family wanted to continue aggressive care as full CODE STATUS. ?12/23 developed Cheyne-Stokes respiratory pattern with respiratory alkalosis.  Transferred to ICU for short-term mechanical ventilation and was found to have new tracheobronchitis now on cefepime ?12/25 back to progressive floor and to TRH ?  ?His hospital course has been complicated by significant neurological impairment, aspiration events, AKI, hypernatremia, urinary retention. ?1/9 trach changed to #6.0 Shiley cuffless--1/10 > 30 days since trach inserted. ?  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  Staff educating family regarding the care of this patient including suctioning, administration of medications and tube feedings ?  ?  ? ?A physical therapy consult is indicated based on the patient?s mobility assessment. ?  ?  ?Mobility Assessment (last 72 hours)   ?  ?  Mobility Assessment   ?  ?  [place] Name 03/29/21 2000 03/29/21 1800 03/29/21 1400 03/29/21 1200 03/29/21 0800  ?  Does patient have an order for bedrest or is patient medically unstable Yes- Bedfast (Level 1) - Complete Yes- Bedfast (Level 1) - Complete Yes- Bedfast (Level 1) - Complete Yes- Bedfast (Level 1) - Complete Yes- Bedfast (Level 1) - Complete  ?  What is the highest level of mobility based on the progressive mobility assessment? Level 1 (Bedfast) - Unable to balance while sitting on edge of bed -- Level 1 (Bedfast) - Unable to balance while sitting on edge of bed Level 1 (Bedfast) - Unable to balance while sitting on edge of bed Level 1 (Bedfast) - Unable to balance while sitting on edge of bed  ?  Is the above level different from baseline mobility prior to current illness? No - Consider discontinuing PT/OT -- Yes - Recommend PT order Yes - Recommend PT order Yes - Recommend PT order  ?  ?  [place] Name 03/28/21 2000 03/28/21 0800   ?    ?    ?   ?  Does patient have an order for bedrest or is patient medically unstable Yes- Bedfast (Level 1) - Complete Yes- Bedfast (  Level 1) - Complete        ?  What is the highest level of mobility based on the progressive mobility assessment? Level 1 (Bedfast) - Unable to balance while sitting on edge of bed Level 1 (Bedfast) - Unable to balance while sitting on edge of bed        ?  Is the above level different from baseline mobility prior to current  illness? No - Consider discontinuing PT/OT No - Consider discontinuing PT/OT        ?  ?   ?  ?  ?   ?  ?  ?  ?Ileus with hypokalemia ?Patient with significant ileus likely related to acute UTI with concomitant pneumonia ?Gastric tube placed to straight drain for greater than 24 hours ?Follow-up KUB on 3/31 reveals resolution of ileus ?Resume tube feedings at 15 cc/h.  Per my discussion with RD recommendation is to change tube feedings.  We will start with Osmolite and see how he tolerates. ?  ?Acute otitis media, right with mastoid effusion-resolved as of 04/30/2021 ?Patient with recent bilateral conjunctivitis that persisted over 2 weeks despite pharmacological therapy ?Maxillofacial CT revealed new right middle ear and mastoid opacification consistent with otitis media with mastoid effusion ?Will need follow-up CT to evaluate for resolution.  If not resolved consideration should be given to ENT evaluation to determine if a candidate for myringotomy tube --Right TM unremarkable and without signs of infection when examined.  ? ?Hypernatremia with metabolic acidosis/hypokalemia ?Likely related to significant dehydration ?Maintenance fluids initiated with potassium but were stopped by attending physician since patient is hypoalbuminemic and beginning to develop peripheral edema ?Bicarb drip initiated on 4/3.  Serum bicarbonate has improved to 19.  Plan to change to her to bicarbonate and follow lab ?4/4 plan D5W for fluid and "free water" replacement.  Will not resume per tube free water since it is suspected this is contributing to decreased gastric emptying and increased gastric distention leading to emesis ?Phosphorus today slightly low at 2.4.  We will repeat again in the a.m. and if continues to drift downward will need to be replaced.  Recent albumin  2.1.  We will check magnesium in a.m. ? ?Microcytic anemia ?As of 4/3 hgb down to 6.9 ?Ck anemia pnl and transfuse 2 units PRBCs-wife at bedside and consent  obtained for transfusion of blood products ?After 2 units PRBCs on 4/3 hemoglobin up to 10 ?Iron low at 32 with a low TIBC and elevated ferritin.  Discussed with attending at this time we will hold off on giving iron note patient did get iron replacement with the packed red blood cells ? ?Leukocytosis ?Noted without left shift although mildly elevated neutrophils and monocytes ?Increased chest congestion on the right and recurrent aspiration suspect therefore will obtain a portable chest x-ray ?  ?Enterobacter UTI ? 3/28 Urine cx positve for > 100,000 colonies of Enterobacter -changed to cefepime 3/28 due to positive sputum (serratia)-continue for 7 days as of 4/3 has completed antibiotic ?  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  Bedside nurse states secretions have thinned considerably since initiation ?To decreased from 50% to 28% with patient maintaining sats now 100% ?Has completed  antibiotics but continues to have significant congestion including right-sided rhonchi and wet sounding cough therefore will check portable chest x-ray on 4/4 (also progressive leukocytosis) ?  ?Malignant hypertension ?For now we will continue topical clonidine.  IV Lopressor discontinued and carvedilol resumed. ?Likely can also resume Norvasc as well as hydralazine on 4/5 if tolerates above medication changes ?  ?Diabetes mellitus type 2 in obese [facility]) ?A1c 6.3 in 12/31/2020 ?Continue SSI every 4 hours ?Tradjenta and Glucotrol remain on hold and likely can be resumed either on 4/5 or 4/6 as tube feeding rate at goal ?To follow CBGs and provide SSI every 4 hours ?  ?Acute  conjunctivitis, bilateral ?Resolved ?  ?Pneumonia due to Klebsiella pneumoniae  ?Resolved ?  ?Protein-calorie malnutrition/dysphagia due to recent stroke/constipation ?Nutrition Problem: Inadequate oral intake ?Etiology:

## 2021-05-05 NOTE — TOC Progression Note (Signed)
Transition of Care (TOC) - Progression Note  ? ? ?Patient Details  ?Name: Stephen Delgado ?MRN: [id number] ?Date of Birth: [birth year] ? ?Transition of Care (TOC) CM/SW Contact  ?[name], RN ?Phone Number: ?05/05/2021, 9:16 AM ? ?Clinical Narrative:    ?CM spoke with Ruthell Rummage, Trach Team RT and requested trach team assistance discharge teaching with the patient's family prior to discharge.  [place], RT was given family contact information and plans to reach out to the family to provide appropriate discharge teaching for the tracheostomy and airway management for the home. ? ?CM and MSW with DTP Team will continue to follow the patient for discharge planning. ? ? ?Expected Discharge Plan: Home w Home Health Services ?Barriers to Discharge: Continued Medical Work up, No SNF bed ? ?Expected Discharge Plan and Services ?Expected Discharge Plan: Home w Home Health Services ?In-house Referral: Clinical Social Work, Hospice / Palliative Care, PCP / Health Connect ?Discharge Planning Services: CM Consult ?Post Acute Care Choice: Home Health ?Living arrangements for the past 2 months: Single Family Home ?                ?DME Arranged: Hospital bed, Trach supplies, Tube feeding, Oxygen ?DME Agency: [employer] ?Date DME Agency Contacted: 03/18/21 ?Time DME Agency Contacted: 1000 ?Representative spoke with at DME Agency: Jenness [place], CM with [employer] ?  ?  ?  ?  ?  ? ? ?Social Determinants of Health (SDOH) Interventions ?  ? ?Readmission Risk Interventions ? ?  02/20/2021  ?  9:31 AM  ?Readmission Risk Prevention Plan  ?Transportation Screening Complete  ?Medication Review [employer]) Complete  ?PCP or Specialist appointment within 3-5 days of discharge Not Complete  ?PCP/Specialist Appt Not Complete comments Patient will need LTC placement at SNF facility.  ?HRI or Home Care Consult Complete  ?SW Recovery Care/Counseling Consult Complete  ?Palliative Care Screening Complete  ?Skilled Nursing Facility  Complete  ? ? ?

## 2021-05-06 LAB — GLUCOSE, CAPILLARY
Glucose-Capillary: 151 mg/dL — ABNORMAL HIGH (ref 70–99)
Glucose-Capillary: 160 mg/dL — ABNORMAL HIGH (ref 70–99)
Glucose-Capillary: 162 mg/dL — ABNORMAL HIGH (ref 70–99)
Glucose-Capillary: 182 mg/dL — ABNORMAL HIGH (ref 70–99)
Glucose-Capillary: 183 mg/dL — ABNORMAL HIGH (ref 70–99)
Glucose-Capillary: 194 mg/dL — ABNORMAL HIGH (ref 70–99)
Glucose-Capillary: 196 mg/dL — ABNORMAL HIGH (ref 70–99)
Glucose-Capillary: 212 mg/dL — ABNORMAL HIGH (ref 70–99)

## 2021-05-06 LAB — CBC WITH DIFFERENTIAL/PLATELET
Abs Immature Granulocytes: 0.16 10*3/uL — ABNORMAL HIGH (ref 0.00–0.07)
Basophils Absolute: 0 10*3/uL (ref 0.0–0.1)
Basophils Relative: 0 %
Eosinophils Absolute: 0.5 10*3/uL (ref 0.0–0.5)
Eosinophils Relative: 3 %
HCT: 28.5 % — ABNORMAL LOW (ref 39.0–52.0)
Hemoglobin: 9.2 g/dL — ABNORMAL LOW (ref 13.0–17.0)
Immature Granulocytes: 1 %
Lymphocytes Relative: 12 %
Lymphs Abs: 1.9 10*3/uL (ref 0.7–4.0)
MCH: 25.1 pg — ABNORMAL LOW (ref 26.0–34.0)
MCHC: 32.3 g/dL (ref 30.0–36.0)
MCV: 77.9 fL — ABNORMAL LOW (ref 80.0–100.0)
Monocytes Absolute: 1.7 10*3/uL — ABNORMAL HIGH (ref 0.1–1.0)
Monocytes Relative: 10 %
Neutro Abs: 11.9 10*3/uL — ABNORMAL HIGH (ref 1.7–7.7)
Neutrophils Relative %: 74 %
Platelets: 205 10*3/uL (ref 150–400)
RBC: 3.66 MIL/uL — ABNORMAL LOW (ref 4.22–5.81)
RDW: 16.8 % — ABNORMAL HIGH (ref 11.5–15.5)
WBC: 16.1 10*3/uL — ABNORMAL HIGH (ref 4.0–10.5)
nRBC: 0 % (ref 0.0–0.2)

## 2021-05-06 LAB — COMPREHENSIVE METABOLIC PANEL
ALT: 45 U/L — ABNORMAL HIGH (ref 0–44)
AST: 31 U/L (ref 15–41)
Albumin: 2 g/dL — ABNORMAL LOW (ref 3.5–5.0)
Alkaline Phosphatase: 41 U/L (ref 38–126)
Anion gap: 8 (ref 5–15)
BUN: 36 mg/dL — ABNORMAL HIGH (ref 6–20)
CO2: 18 mmol/L — ABNORMAL LOW (ref 22–32)
Calcium: 9.6 mg/dL (ref 8.9–10.3)
Chloride: 122 mmol/L — ABNORMAL HIGH (ref 98–111)
Creatinine, Ser: 1.56 mg/dL — ABNORMAL HIGH (ref 0.61–1.24)
GFR, Estimated: 51 mL/min — ABNORMAL LOW (ref >60)
Glucose, Bld: 165 mg/dL — ABNORMAL HIGH (ref 70–99)
Potassium: 3.9 mmol/L (ref 3.5–5.1)
Sodium: 148 mmol/L — ABNORMAL HIGH (ref 135–145)
Total Bilirubin: 0.3 mg/dL (ref 0.3–1.2)
Total Protein: 6.7 g/dL (ref 6.5–8.1)

## 2021-05-06 LAB — MAGNESIUM: Magnesium: 2.2 mg/dL (ref 1.7–2.4)

## 2021-05-06 LAB — PHOSPHORUS: Phosphorus: 1.5 mg/dL — ABNORMAL LOW (ref 2.5–4.6)

## 2021-05-06 MED ORDER — HYDRALAZINE HCL 25 MG PO TABS
25.0000 mg | ORAL_TABLET | Freq: Three times a day (TID) | ORAL | Status: DC
  Administered 2021-05-06 – 2021-05-07 (×4): 25 mg via ORAL
  Filled 2021-05-06 (×4): qty 1

## 2021-05-06 MED ORDER — K PHOS MONO-SOD PHOS DI & MONO 155-852-130 MG PO TABS
500.0000 mg | ORAL_TABLET | Freq: Two times a day (BID) | ORAL | Status: DC
  Administered 2021-05-06 – 2021-05-07 (×3): 500 mg via ORAL
  Filled 2021-05-06 (×5): qty 2

## 2021-05-06 MED ORDER — AMLODIPINE BESYLATE 10 MG PO TABS
10.0000 mg | ORAL_TABLET | Freq: Every day | ORAL | Status: DC
Start: 2021-05-06 — End: 2021-05-08
  Administered 2021-05-06 – 2021-05-07 (×2): 10 mg
  Filled 2021-05-06 (×2): qty 1

## 2021-05-06 NOTE — Progress Notes (Signed)
Called wife today at 07:00. am  Spoke with her she is working today.  She is trying to get approved PAL time next week and will contact us at [phone] next week to set up dates and times for education and teaching for the tracheostomy and airway management for the home.  ?

## 2021-05-06 NOTE — Progress Notes (Signed)
?Progress Note ?  ?  ?Patient: Stephen Delgado [id number] DOB: 1965-07-24 DOA: 12/30/2020     122 ?DOS: the patient was seen and examined on 05/01/2021 ?  ?  ?  ?Brief hospital course: ?56 y.o. male with PMH significant for DM2, HTN, CKD with questionable compliance to medications ?Patient presented to the ED on 12/30/2020 with complaint of headache, altered mental status ?  ?In the ED, he was hypertensive to 238/160, agitated, required restraints ?CT head showed large amount of intraventricular hemorrhage involving lateral/third/fourth ventricle, ICH and left basal ganglia as well as chronic microvascular ischemic changes. ?He was started on Cleviprex drip and admitted to neuro ICU ?  ?He subsequently had CT head repeated on 11/30, 12/1 and 12/3, all demonstrated unchanged intraparenchymal hemorrhage and intraventricular hemorrhage ?12/3, MRI brain from also showed numerous small acute infarcts in both cerebral hemisphere with minimal improvement in the right superior cerebellum ?12/3, intubated ?12/13, tracheostomy ?12/15, wean from vent to trach collar ?12/16, PEG tube placement ?12/16, repeat MRI showed an increase in the size and number of acute infarcts in the bilateral hemispheres with increased involvement in the cerebellum bilaterally.   ?12/18, transferred from neuro service to [facility]  ?12/21, palliative care consulted.  Family wanted to continue aggressive care as full CODE STATUS. ?12/23 developed Cheyne-Stokes respiratory pattern with respiratory alkalosis.  Transferred to ICU for short-term mechanical ventilation and was found to have new tracheobronchitis now on cefepime ?12/25 back to progressive floor and to TRH ?  ?His hospital course has been complicated by significant neurological impairment, aspiration events, AKI, hypernatremia, urinary retention. ?1/9 trach changed to #6.0 Shiley cuffless--1/10 > 30 days since trach inserted. ?  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  Staff educating family regarding the care of this patient including suctioning, administration of medications and tube feedings ?  ?  ? ?A physical therapy consult is indicated based on the patient?s mobility assessment. ?  ?  ?Mobility Assessment (last 72 hours)   ?  ?  Mobility Assessment   ?  ?  [place] Name 03/29/21 2000 03/29/21 1800 03/29/21 1400 03/29/21 1200 03/29/21 0800  ?  Does patient have an order for bedrest or is patient medically unstable Yes- Bedfast (Level 1) - Complete Yes- Bedfast (Level 1) - Complete Yes- Bedfast (Level 1) - Complete Yes- Bedfast (Level 1) - Complete Yes- Bedfast (Level 1) - Complete  ?  What is the highest level of mobility based on the progressive mobility assessment? Level 1 (Bedfast) - Unable to balance while sitting on edge of bed -- Level 1 (Bedfast) - Unable to balance while sitting on edge of bed Level 1 (Bedfast) - Unable to balance while sitting on edge of bed Level 1 (Bedfast) - Unable to balance while sitting on edge of bed  ?  Is the above level different from baseline mobility prior to current illness? No - Consider discontinuing PT/OT -- Yes - Recommend PT order Yes - Recommend PT order Yes - Recommend PT order  ?  ?  [place] Name 03/28/21 2000 03/28/21 0800   ?    ?    ?   ?  Does patient have an order for bedrest or is patient medically unstable Yes- Bedfast (Level 1) - Complete Yes- Bedfast (Level 1) - Complete        ?  What is the highest level of mobility based on the progressive mobility assessment? Level 1 (Bedfast) - Unable to balance while sitting on edge of bed Level 1  (Bedfast) - Unable to balance while sitting on edge of bed        ?  Is the above level different from baseline mobility prior to current illness? No - Consider discontinuing PT/OT No - Consider discontinuing PT/OT        ?  ?   ?  ?  ?   ?  ?  ?  ?Ileus with hypokalemia ?Patient with significant ileus likely related to acute UTI with concomitant pneumonia ?Gastric tube placed to straight drain for greater than 24 hours ?Follow-up KUB on 3/31 reveals resolution of ileus ?Patient tolerating slow titration upward of tube feeding which is now at 55 cc/h-suspect combination of type of tube feeding (tube feeding changed at recommendation of nutrition) and high volume free water contributed to emesis ?  ?Acute otitis media, right with mastoid effusion-resolved as of 04/30/2021 ?Patient with recent bilateral conjunctivitis that persisted over 2 weeks despite pharmacological therapy ?Maxillofacial CT revealed new right middle ear and mastoid opacification consistent with otitis media with mastoid effusion ?Will need follow-up CT to evaluate for resolution.  If not resolved consideration should be given to ENT evaluation to determine if a candidate for myringotomy tube --Right TM unremarkable and without signs of infection when examined.  ? ?Hypernatremia with metabolic acidosis/hypokalemia/mild acute kidney injury ?Likely related to significant dehydration ?Maintenance fluids initiated with potassium but were stopped by attending physician since patient is hypoalbuminemic and beginning to develop peripheral edema ?Bicarb drip initiated on 4/3.  Serum bicarbonate has improved to 19.  Plan to change to her to bicarbonate and follow lab ?As of 4/5 sodium 148 so will increase D5W from 50 cc to 100 cc/h.  CO2 18 so we will continue bicarbonate per tube.  Patient having compensatory Kussmauls respiratory pattern.  Chane 1.56.  Magnesium normal at 2.2.  Potassium low normal at 3.9.  Phosphorus has dropped to 1.5 will replace with  potassium phosphate ? ?Microcytic anemia ?As of 4/3 hgb down to 6.9 ?Ck anemia pnl and transfuse 2 units PRBCs-wife at bedside and consent obtained for transfusion of blood products ?After 2 units PRBCs on 4/3 hemoglobin up to 10 ?Iron low at 32 with a low TIBC and elevated ferritin.  Discussed with attending at this time we will hold off on giving iron note patient did get iron replacement with the packed red blood cells ?As of 4/5 hemoglobin up to 9.2 after transfusion ? ?Leukocytosis ?Noted without left shift although mildly elevated neutrophils and monocytes ?Chest x-ray 4/4 unremarkable ?Continues to have mild leukocytosis without left shift.  No fevers. ?  ?Enterobacter UTI ? 3/28 Urine cx positve for > 100,000 colonies of Enterobacter -changed to cefepime 3/28 due to positive sputum (serratia)-continue for 7 days as of 4/3 has completed antibiotic ?  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  Bedside nurse  states secretions have thinned considerably since initiation ?To decreased from 50% to 28% with patient maintaining sats now 100% ?Has completed antibiotics but continues to have significant congestion including right-sided rhonchi and wet sounding cough therefore will check portable chest x-ray on 4/4 (also progressive leukocytosis) ?  ?Malignant hypertension ?For now we will continue topical clonidine.  IV Lopressor discontinued and carvedilol resumed. ?4/5 blood pressure remains elevated so we will resume Norvasc at 10 mg and begin hydralazine every 8 hours at 25 mg ?  ?Diabetes mellitus type 2 in obese [facility]) ?A1c 6.3 in 12/31/2020 ?Continue SSI every 4  hours ?Tradjenta and Glucotrol remain on hold and likely can be resumed either on 4/5 or 4/6 as tube feeding rate at goal ?To follow CBGs and provide SSI every 4 hours ?  ?Acute conjunctivitis, bilateral

## 2021-05-06 NOTE — Progress Notes (Signed)
Patient seen yesterday by trach team for consult.  All necessary equipment is at beside. Tired to contact wife 3 times and left a message and contact phone numbers.  Spoke with nursing  staff also.  I also reach ou to his  adult son Stephen Delgado.  Spoke with him about being education and teaching for the tracheostomy and airway management for the home.  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  ?  ?

## 2021-05-07 ENCOUNTER — Inpatient Hospital Stay (HOSPITAL_COMMUNITY): Payer: 59

## 2021-05-07 LAB — CBC WITH DIFFERENTIAL/PLATELET
Abs Immature Granulocytes: 0.22 10*3/uL — ABNORMAL HIGH (ref 0.00–0.07)
Basophils Absolute: 0.1 10*3/uL (ref 0.0–0.1)
Basophils Relative: 0 %
Eosinophils Absolute: 0.6 10*3/uL — ABNORMAL HIGH (ref 0.0–0.5)
Eosinophils Relative: 4 %
HCT: 28.1 % — ABNORMAL LOW (ref 39.0–52.0)
Hemoglobin: 8.7 g/dL — ABNORMAL LOW (ref 13.0–17.0)
Immature Granulocytes: 1 %
Lymphocytes Relative: 15 %
Lymphs Abs: 2.3 10*3/uL (ref 0.7–4.0)
MCH: 24.2 pg — ABNORMAL LOW (ref 26.0–34.0)
MCHC: 31 g/dL (ref 30.0–36.0)
MCV: 78.3 fL — ABNORMAL LOW (ref 80.0–100.0)
Monocytes Absolute: 1.4 10*3/uL — ABNORMAL HIGH (ref 0.1–1.0)
Monocytes Relative: 9 %
Neutro Abs: 11.1 10*3/uL — ABNORMAL HIGH (ref 1.7–7.7)
Neutrophils Relative %: 71 %
Platelets: 227 10*3/uL (ref 150–400)
RBC: 3.59 MIL/uL — ABNORMAL LOW (ref 4.22–5.81)
RDW: 17.2 % — ABNORMAL HIGH (ref 11.5–15.5)
WBC: 15.6 10*3/uL — ABNORMAL HIGH (ref 4.0–10.5)
nRBC: 0.1 % (ref 0.0–0.2)

## 2021-05-07 LAB — COMPREHENSIVE METABOLIC PANEL
ALT: 54 U/L — ABNORMAL HIGH (ref 0–44)
AST: 36 U/L (ref 15–41)
Albumin: 2 g/dL — ABNORMAL LOW (ref 3.5–5.0)
Alkaline Phosphatase: 49 U/L (ref 38–126)
Anion gap: 4 — ABNORMAL LOW (ref 5–15)
BUN: 33 mg/dL — ABNORMAL HIGH (ref 6–20)
CO2: 20 mmol/L — ABNORMAL LOW (ref 22–32)
Calcium: 9.3 mg/dL (ref 8.9–10.3)
Chloride: 122 mmol/L — ABNORMAL HIGH (ref 98–111)
Creatinine, Ser: 1.52 mg/dL — ABNORMAL HIGH (ref 0.61–1.24)
GFR, Estimated: 53 mL/min — ABNORMAL LOW (ref >60)
Glucose, Bld: 173 mg/dL — ABNORMAL HIGH (ref 70–99)
Potassium: 3.5 mmol/L (ref 3.5–5.1)
Sodium: 146 mmol/L — ABNORMAL HIGH (ref 135–145)
Total Bilirubin: 0.2 mg/dL — ABNORMAL LOW (ref 0.3–1.2)
Total Protein: 6.3 g/dL — ABNORMAL LOW (ref 6.5–8.1)

## 2021-05-07 LAB — GLUCOSE, CAPILLARY
Glucose-Capillary: 188 mg/dL — ABNORMAL HIGH (ref 70–99)
Glucose-Capillary: 192 mg/dL — ABNORMAL HIGH (ref 70–99)
Glucose-Capillary: 166 mg/dL — ABNORMAL HIGH (ref 70–99)
Glucose-Capillary: 157 mg/dL — ABNORMAL HIGH (ref 70–99)

## 2021-05-07 LAB — PHOSPHORUS: Phosphorus: 1.6 mg/dL — ABNORMAL LOW (ref 2.5–4.6)

## 2021-05-07 MED ORDER — PANTOPRAZOLE SODIUM 40 MG IV SOLR
40.0000 mg | Freq: Two times a day (BID) | INTRAVENOUS | Status: DC
  Administered 2021-05-07 – 2021-05-22 (×30): 40 mg via INTRAVENOUS
  Filled 2021-05-07 (×30): qty 10

## 2021-05-07 MED ORDER — POTASSIUM PHOSPHATES 15 MMOLE/5ML IV SOLN
30.0000 mmol | Freq: Once | INTRAVENOUS | Status: AC
  Administered 2021-05-07: 30 mmol via INTRAVENOUS
  Filled 2021-05-07: qty 10

## 2021-05-07 NOTE — Progress Notes (Signed)
RT came to deliver trach supplies and noticed dark brown secretions on pts face, chest and in trach collar. RT cleaned dried and applied trach dressing, cleaned trach collar and suctioned trach. Pts had a  moderate amount tan and white tracheal secretions. SpO2 96% on 5L 21% trach collar, no distress noted at this time, RN aware, RT will continue to monitor.  ?

## 2021-05-07 NOTE — Progress Notes (Signed)
?Progress Note ?  ?  ?Patient: Stephen Delgado [id number] DOB: 01-14-1966 DOA: 12/30/2020     122 ?DOS: the patient was seen and examined on 05/01/2021 ?  ?  ?  ?Brief hospital course: ?56 y.o. male with PMH significant for DM2, HTN, CKD with questionable compliance to medications ?Patient presented to the ED on 12/30/2020 with complaint of headache, altered mental status ?  ?In the ED, he was hypertensive to 238/160, agitated, required restraints ?CT head showed large amount of intraventricular hemorrhage involving lateral/third/fourth ventricle, ICH and left basal ganglia as well as chronic microvascular ischemic changes. ?He was started on Cleviprex drip and admitted to neuro ICU ?  ?He subsequently had CT head repeated on 11/30, 12/1 and 12/3, all demonstrated unchanged intraparenchymal hemorrhage and intraventricular hemorrhage ?12/3, MRI brain from also showed numerous small acute infarcts in both cerebral hemisphere with minimal improvement in the right superior cerebellum ?12/3, intubated ?12/13, tracheostomy ?12/15, wean from vent to trach collar ?12/16, PEG tube placement ?12/16, repeat MRI showed an increase in the size and number of acute infarcts in the bilateral hemispheres with increased involvement in the cerebellum bilaterally.   ?12/18, transferred from neuro service to [facility]  ?12/21, palliative care consulted.  Family wanted to continue aggressive care as full CODE STATUS. ?12/23 developed Cheyne-Stokes respiratory pattern with respiratory alkalosis.  Transferred to ICU for short-term mechanical ventilation and was found to have new tracheobronchitis now on cefepime ?12/25 back to progressive floor and to TRH ?  ?His hospital course has been complicated by significant neurological impairment, aspiration events, AKI, hypernatremia, urinary retention. ?1/9 trach changed to #6.0 Shiley cuffless--1/10 > 30 days since trach inserted. ?  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  Staff educating family regarding the care of this patient including suctioning, administration of medications and tube feedings ?  ?  ? ?A physical therapy consult is indicated based on the patient?s mobility assessment. ?  ?  ?Mobility Assessment (last 72 hours)   ?  ?  Mobility Assessment   ?  ?  Level Green Name 03/29/21 2000 03/29/21 1800 03/29/21 1400 03/29/21 1200 03/29/21 0800  ?  Does patient have an order for bedrest or is patient medically unstable Yes- Bedfast (Level 1) - Complete Yes- Bedfast (Level 1) - Complete Yes- Bedfast (Level 1) - Complete Yes- Bedfast (Level 1) - Complete Yes- Bedfast (Level 1) - Complete  ?  What is the highest level of mobility based on the progressive mobility assessment? Level 1 (Bedfast) - Unable to balance while sitting on edge of bed -- Level 1 (Bedfast) - Unable to balance while sitting on edge of bed Level 1 (Bedfast) - Unable to balance while sitting on edge of bed Level 1 (Bedfast) - Unable to balance while sitting on edge of bed  ?  Is the above level different from baseline mobility prior to current illness? No - Consider discontinuing PT/OT -- Yes - Recommend PT order Yes - Recommend PT order Yes - Recommend PT order  ?  ?  [place] Name 03/28/21 2000 03/28/21 0800   ?    ?    ?   ?  Does patient have an order for bedrest or is patient medically unstable Yes- Bedfast (Level 1) - Complete Yes- Bedfast (Level 1) - Complete        ?  What is the highest level of mobility based on the progressive mobility assessment? Level 1 (Bedfast) - Unable to balance while sitting on edge of bed Level 1  (Bedfast) - Unable to balance while sitting on edge of bed        ?  Is the above level different from baseline mobility prior to current illness? No - Consider discontinuing PT/OT No - Consider discontinuing PT/OT        ?  ?   ?  ?  ?   ?  ?  ?  ?Ileus with hypokalemia (resolved) ?Patient with significant ileus likely related to acute UTI with concomitant pneumonia ?Gastric tube placed to straight drain for greater than 24 hours ?Follow-up KUB on 3/31 reveals resolution of ileus ?Patient tolerating slow titration upward of tube feeding which is now at 55 cc/h-suspect combination of type of tube feeding (tube feeding changed at recommendation of nutrition) and high volume free water contributed to emesis ?  ?Acute otitis media, right with mastoid effusion-resolved as of 04/30/2021 ?Patient with recent bilateral conjunctivitis that persisted over 2 weeks despite pharmacological therapy ?Maxillofacial CT revealed new right middle ear and mastoid opacification consistent with otitis media with mastoid effusion ?Will need follow-up CT to evaluate for resolution.  If not resolved consideration should be given to ENT evaluation to determine if a candidate for myringotomy tube --Right TM unremarkable and without signs of infection when examined.  ? ?Hypernatremia with metabolic acidosis/hypokalemia/mild acute kidney injury ?Likely related to significant dehydration ?D5W maintenance fluids increased to 100 cc/h on 4/5 and today 4/6 sodium down to 146.  Potassium 3.5.  CO2 up to 20 with hydration and utilization of per tube bicarb, albumin stable at 2.0, for still low at 1.6 so in addition to per tube phosphorus we will give one-time dose of K-Phos 30 mmol IV ?Repeat labs in a.m.-May need to consider regular replacement of phosphorus per tube ? ?Microcytic anemia ?As of 4/3 hgb down to 6.9 ?After 2 units PRBCs on 4/3 hemoglobin up to 10 ?Iron low at 32 with a low TIBC and elevated ferritin.  Discussed with attending at this  time we will hold off on giving iron- patient did get iron replacement with the packed red blood cells ?As of 4/5 hemoglobin up to 9.2 after transfusion ? ?Leukocytosis ?Noted without left shift although mildly elevated neutrophils and monocytes ?Chest x-ray 4/4 unremarkable ?Continues to have mild leukocytosis without left shift.  No fevers. ?  ?Enterobacter UTI ? 3/28 Urine cx positve for > 100,000 colonies of Enterobacter -changed to cefepime 3/28 due to positive sputum (serratia)-continue for 7 days as of 4/3 has completed antibiotic ?  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  Bedside nurse states secretions have thinned considerably since initiation ?Today noted to have excessive oropharyngeal foamy secretions ?  ?Malignant hypertension ?For now we will continue topical clonidine.  IV Lopressor discontinued and carvedilol resumed. ?4/5 blood pressure remains elevated so we will resume Norvasc at 10 mg and begin  hydralazine every 8 hours at 25 mg ?  ?Diabetes mellitus type 2 in obese [facility]) ?A1c 6.3 in 12/31/2020 ?Continue SSI every 4 hours ?Tradjenta and Glucotrol remain on hold and likely can be resumed either on 4/5 or 4/6 as tube feeding rate at goal ?To follow CBGs and provide SSI every 4 hours ?  ?Acute conjunctivitis, bilateral ?Resolved ?  ?Pneumonia due to Klebsiella pneumoniae  ?Resolved ?  ?Protein-calorie malnutrition/dysphagia due to recent stroke/constipation ?Nutrition Problem: Inadequate oral intake ?Etiology: lethargy/confusion, dysphagia ?Signs/Symptoms: NPO status ?Interventions: Tube feeding via PEG hold due to recurrent nausea and vomiting  and ileus type symptoms-discussed with dietitian and suspected previous tube feeding may be contributing to patient's inability to digest and subsequent recurrent emesis.  Tube feeding changed and rate

## 2021-05-07 NOTE — Progress Notes (Signed)
?   05/07/21 2132  ?Therapy Vitals  ?Pulse Rate 97  ?Resp (!) 22  ?MEWS Score/Color  ?MEWS Score 1  ?MEWS Score Color Green  ?Respiratory Assessment  ?Assessment Type Assess only  ?Respiratory Pattern Regular;Unlabored  ?Chest Assessment Chest expansion symmetrical  ?Cough Productive  ?Sputum Amount Small  ?Sputum Color White  ?Sputum Consistency Thick;Thin  ?Sputum Specimen Source Tracheostomy tube  ?Bilateral Breath Sounds Diminished;Rhonchi  ?Oxygen Therapy/Pulse Ox  ?O2 Device Tracheostomy Collar  ?O2 Therapy Oxygen humidified  ?O2 Flow Rate (L/min) 5 L/min  ?FiO2 (%) 21 %  ?Tracheostomy Shiley Flexible 6 mm Uncuffed  ?Placement Date/Time: 05/01/21 1559   Placed By: Other (Comment)  Brand: Shiley Flexible  Size (mm): 6 mm  Style: Uncuffed  ?Status Secured  ?Site Assessment Clean;Dry  ?Site Care Cleansed;Dried;Dressing applied;Protective barrier to skin  ?Inner Cannula Care (S)  Cleansed/dried;Changed/new  ?Ties Assessment Clean, Dry, Secure  ?Cuff Pressure (cm H2O)  ?(cuffless)  ?Tracheostomy Equipment at bedside Yes and checklist posted at head of bed  ?Respiratory  ?Airway LDA Tracheostomy  ? ?Changed pt. Ties pt. Is vomiting dark brown RN aware and doctor has been notified. ? ?

## 2021-05-07 NOTE — Progress Notes (Signed)
? ?NAME:  Stephen Delgado, MRN:  [id number], DOB:  1965-09-15, LOS: 128 ?ADMISSION DATE:  12/30/2020, CONSULTATION DATE:  12/19 ?REFERRING MD:  Erlinda Hong, CHIEF COMPLAINT:  Dyspnea  ? ?History of Present Illness:  ?56 yo male presented with headache, nausea and altered mental status.  CT head showed acute Lt ICH with IVH likely related to hypertensive emergency (BP 195/116). Had persistent hypertension and concern for airway protection, and PCCM asked to assist with ICU management.  Tracheostomy 12/13.  Moved out of ICU.  ? ?Pertinent  Medical History  ?HTN, DM type 2, CKD 3a, OSA ? ?Significant Hospital Events: ?Including procedures, antibiotic start and stop dates in addition to other pertinent events   ?11/29 Admit, start cleviprex ?12/01 PCCM consulted; changed from cleviprex to cardene due to elevated triglycerides ?12/03 intubated; episode of vomiting >> tube feeds held ?12/04 resume trickle tube feeds ?12/06 remains minimally responsive. Tolerating SBT.  ?12/12 no acute events overnight, T-max 102 point ?12/13 bedside trach planned midmorning  ?12/14 ATC. Lasix --> 5L UOP  ?12/15 cont on trach collar. Na to 154 from 145. Adding low rate d5  ?12/16 scheduled for PEG. Cont D5. Continues on trach collar ?Out of ICU 12/19 ?12/23 Cheyne-Stokes respirations noted and was transferred to ICU for short-term ventilation.  ?12/25 Returned to floor ?1/9 Trach changed to 6 cuffless  ?2/6 Trach follow up, no major changes, remains on ATC  ?2/27 trach f/u, on TC, seems to attend briefly, otherwise poor neuro exam ?04/06/2021 Trach FU, On ATC at 28%, no change in MS, neuro exam ? ?Imaging/Studies: ?CT head 11/29 >> large amount of IVH involving lateral/3rd/4th ventricles, ICH in Lt BG, chronic microvascular ischemic changes ?Echo 11/30 >> EF 50 to 55%, mod LVH, grade 1 DD, ascending aorta 37 mm ?EEG 12/01 >> continuous generalized slowing ?MRI brain 12/03 >> numerous small acute infarcts in b/l cerebral hemispheres, Lt caudate  hemorrhage, IVH, scattered sulcal SAH, mild communicating hydrocephalus ?CT head 12/23 Expected evolution of strokes, no shift or herniation ? ? ? ?Interim History / Subjective:  ? ?No changes in trach collar ? ?Objective   ?Blood pressure (!) 145/89, pulse 94, temperature 98.2 ?F (36.8 ?C), resp. rate 18, height 6' (1.829 m), weight 111.1 kg, SpO2 98 %. ?   ?FiO2 (%):  [21 %] 21 %  ? ?Intake/Output Summary (Last 24 hours) at 05/07/2021 1205 ?Last data filed at 05/07/2021 1146 ?Gross per 24 hour  ?Intake 4292.95 ml  ?Output 1200 ml  ?Net 3092.95 ml  ? ?Filed Weights  ? 03/16/21 0500 04/13/21 1700 04/28/21 0500  ?Weight: 83.5 kg 108 kg 111.1 kg  ? ?Physical exam  ?General: No response ?HEENT: MM pink/moist #6 trach moderate secretions ?Neuro: Does not follow commands ?CV:  ?PULM: Creased in the bases ? ? ?GI: soft, bsx4 active  ?GU: Amber urine ?Extremities: warm/dry, negative edema  ?Skin: no rashes or lesions ? ? ?Resolved Hospital Problem list   ? ? ?Assessment & Plan:  ? 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 ?Pulmonary toilet ?Trach care ?Nebulized ?PCCM will follow weekly ? ? ? ?Richardson Landry Theressa Piedra ACNP ?Acute Care  Nurse Practitioner ?[place] ?Please consult Amion ?05/07/2021, 12:05 PM ? ? ? ? ? ? ? ?

## 2021-05-07 NOTE — TOC Progression Note (Signed)
Transition of Care (TOC) - Progression Note  ? ? ?Patient Details  ?Name: Stephen Delgado ?MRN: [id number] ?Date of Birth: 04/03/1965 ? ?Transition of Care (TOC) CM/SW Contact  ?[name], RN ?Phone Number: ?05/07/2021, 12:11 PM ? ?Clinical Narrative:    ?CM called and spoke with Janey Greaser, RNCM with [facility] Private Duty Trach Team and [place] insurance has approved the patient for private duty nursing in the home - pending discharge date for home on 05/19/2021 when private duty nursing in the home will be available.  I informed Junious Silk, NP of potential discharge date to home - 05/19/2021.   ? ?[place], RNCM with [facility] has requested patient be sent home with Shiley Trach 6 mm uncuffed and next size down, 4 mm Shiley for emergency purposes for home.  Adult Ambu bag was also requested.  I spoke with Ruthell Rummage, RT/Trach Team and she has communicated with the patient's wife/son to arrange teaching time at the hospital - likely next week - see [place], RT note.  I also requested Adult ambu bag and size 4 mm Shiley be sent home with the patient. ? ?[employer] will be the providing equipment company and plan to order dme to be delivered to the home 2-3 days prior to discharge date and to include feeding tube/formula per RD, trach and suction supplies, oxygen, pulse oximeter, and hospital bed.  Home health dme orders are current and will follow up with [employer] to deliver closer to discharge date. ? ?Updated clinicals, RT notes, RD note, and progress note will be forwarded to [employer] Team as requested. ? ?CM and MSW with [facility]TP Team will continue to follow the patient for discharge needs to home. ? ? ?Expected Discharge Plan: Home w Home Health Services ?Barriers to Discharge: Continued Medical Work up, No SNF bed ? ?Expected Discharge Plan and Services ?Expected Discharge Plan: Home w Home Health Services ?In-house Referral: Clinical Social Work, Hospice / Palliative Care, PCP / Health  Connect ?Discharge Planning Services: CM Consult ?Post Acute Care Choice: Home Health ?Living arrangements for the past 2 months: Single Family Home ?                ?DME Arranged: Hospital bed, Trach supplies, Tube feeding, Oxygen ?DME Agency: [employer] ?Date DME Agency Contacted: 03/18/21 ?Time DME Agency Contacted: 1000 ?Representative spoke with at DME Agency: Jenness Corner, CM with [employer] ?  ?  ?  ?  ?  ? ? ?Social Determinants of Health (SDOH) Interventions ?  ? ?Readmission Risk Interventions ? ?  02/20/2021  ?  9:31 AM  ?Readmission Risk Prevention Plan  ?Transportation Screening Complete  ?Medication Review Oceanographer) Complete  ?PCP or Specialist appointment within 3-5 days of discharge Not Complete  ?PCP/Specialist Appt Not Complete comments Patient will need LTC placement at SNF facility.  ?HRI or Home Care Consult Complete  ?SW Recovery Care/Counseling Consult Complete  ?Palliative Care Screening Complete  ?Skilled Nursing Facility Complete  ? ? ?

## 2021-05-07 NOTE — Progress Notes (Addendum)
HOSPITAL MEDICINE OVERNIGHT EVENT NOTE   ? ?Notified by nursing that patient has had multiple bouts coffee-ground emesis throughout the day. ? ?Patient is hemodynamically stable and does not appear to be in distress.  Abdomen is soft. ? ?Patient could possibly be exhibiting an intolerance to his tube feeds.  Alternatively patient could be experiencing an upper gastrointestinal bleed as patient has received a blood transfusion recently.  Finally, it is also possible that patient is developing a recurrent ileus. ? ?Holding tube feeds for now.  Initiating intravenous Protonix.  Obtaining abdominal x-ray to evaluate for any evidence of ileus/small bowel obstruction.  Obtaining stat CBC and will repeat in 6 hours.  Obtaining coagulation profile and type and screen.  We will transfuse with PRBC if hemoglobin is less than 7. ? ?Stephen Elk  MD ?[facility]  ? ?ADDENDUM (4/7 3:15am) ? ?Notified by nursing that patient has developed increasing respiratory distress over the past 2 hours.  Patient is exhibiting a red MEWS score. ? ?Patient is exhibiting increasing tachycardia and tachypnea.  Rapid response was called and respiratory responded by placing the patient on supplemental oxygen via trach collar with the patient now on 35% FiO2. ? ?A chest x-ray was ordered but has yet to be performed.  The labs that I had ordered at 11 PM yesterday evening also have yet to be performed. ? ?We will obtain stat ABG, follow-up on recently ordered chest x-ray and ask lab to expedite the other labs I had ordered in addition to obtaining a BNP.  Based on these results we will treat accordingly. ? ?Continue close clinical monitoring. ? ?Stephen Delgado ? ? ?ADDENDUM (4/7 6:30am) ? ?Patient evaluated at the bedside.  Patient is nonverbal which is his baseline.  While patient is somewhat tachypneic patient does not appear to be in respiratory distress.  Patient is saturating 100% on trach collar at 35% FiO2. ? ?Lung exam  reveals diffuse substantial rhonchi with mild bibasilar rales.  No significant wheezing noted.  No significant evidence of jugular venous distention however patient does seem to have some degree of distal bilateral lower extremity edema. ? ?Respiratory status overall seems to be improving with vigorous suctioning and withholding tube feeds.  We will continue regular suctioning.  We will temporarily hold intravenous fluids.  Continuing to provide patient with intravenous Protonix as noted above with daytime team to continue to monitor serial CBCs and determine when reinitiation of tube feeds as appropriate. ? ?Finally, we will go ahead and administer 40 mg of intravenous Lasix x1 as patient may have developed some degree of volume overload being on continuous D5 water over the past several days and 2 feet simultaneously. ? ?Stephen Delgado ? ? ? ? ? ? ? ? ? ? ? ?

## 2021-05-08 ENCOUNTER — Inpatient Hospital Stay (HOSPITAL_COMMUNITY): Payer: 59

## 2021-05-08 DIAGNOSIS — R1110 Vomiting, unspecified: Secondary | ICD-10-CM

## 2021-05-08 LAB — GLUCOSE, CAPILLARY
Glucose-Capillary: 105 mg/dL — ABNORMAL HIGH (ref 70–99)
Glucose-Capillary: 112 mg/dL — ABNORMAL HIGH (ref 70–99)
Glucose-Capillary: 115 mg/dL — ABNORMAL HIGH (ref 70–99)
Glucose-Capillary: 119 mg/dL — ABNORMAL HIGH (ref 70–99)
Glucose-Capillary: 119 mg/dL — ABNORMAL HIGH (ref 70–99)
Glucose-Capillary: 119 mg/dL — ABNORMAL HIGH (ref 70–99)
Glucose-Capillary: 130 mg/dL — ABNORMAL HIGH (ref 70–99)
Glucose-Capillary: 149 mg/dL — ABNORMAL HIGH (ref 70–99)

## 2021-05-08 LAB — URINALYSIS, ROUTINE W REFLEX MICROSCOPIC
Bilirubin Urine: NEGATIVE
Glucose, UA: NEGATIVE mg/dL
Ketones, ur: NEGATIVE mg/dL
Nitrite: NEGATIVE
Protein, ur: 30 mg/dL — AB
Specific Gravity, Urine: 1.009 (ref 1.005–1.030)
pH: 5 (ref 5.0–8.0)

## 2021-05-08 LAB — BLOOD GAS, ARTERIAL
Acid-base deficit: 3.2 mmol/L — ABNORMAL HIGH (ref 0.0–2.0)
Bicarbonate: 19.9 mmol/L — ABNORMAL LOW (ref 20.0–28.0)
O2 Saturation: 99.1 %
Patient temperature: 37.9
pCO2 arterial: 29 mmHg — ABNORMAL LOW (ref 32–48)
pH, Arterial: 7.45 (ref 7.35–7.45)
pO2, Arterial: 133 mmHg — ABNORMAL HIGH (ref 83–108)

## 2021-05-08 LAB — CBC
HCT: 29.3 % — ABNORMAL LOW (ref 39.0–52.0)
Hemoglobin: 9.1 g/dL — ABNORMAL LOW (ref 13.0–17.0)
MCH: 24.2 pg — ABNORMAL LOW (ref 26.0–34.0)
MCHC: 31.1 g/dL (ref 30.0–36.0)
MCV: 77.9 fL — ABNORMAL LOW (ref 80.0–100.0)
Platelets: 243 10*3/uL (ref 150–400)
RBC: 3.76 MIL/uL — ABNORMAL LOW (ref 4.22–5.81)
RDW: 17.5 % — ABNORMAL HIGH (ref 11.5–15.5)
WBC: 22.6 10*3/uL — ABNORMAL HIGH (ref 4.0–10.5)
nRBC: 0 % (ref 0.0–0.2)

## 2021-05-08 LAB — COMPREHENSIVE METABOLIC PANEL
ALT: 68 U/L — ABNORMAL HIGH (ref 0–44)
AST: 39 U/L (ref 15–41)
Albumin: 2.1 g/dL — ABNORMAL LOW (ref 3.5–5.0)
Alkaline Phosphatase: 52 U/L (ref 38–126)
Anion gap: 8 (ref 5–15)
BUN: 35 mg/dL — ABNORMAL HIGH (ref 6–20)
CO2: 19 mmol/L — ABNORMAL LOW (ref 22–32)
Calcium: 9.3 mg/dL (ref 8.9–10.3)
Chloride: 116 mmol/L — ABNORMAL HIGH (ref 98–111)
Creatinine, Ser: 1.47 mg/dL — ABNORMAL HIGH (ref 0.61–1.24)
GFR, Estimated: 55 mL/min — ABNORMAL LOW (ref >60)
Glucose, Bld: 131 mg/dL — ABNORMAL HIGH (ref 70–99)
Potassium: 3.9 mmol/L (ref 3.5–5.1)
Sodium: 143 mmol/L (ref 135–145)
Total Bilirubin: 0.5 mg/dL (ref 0.3–1.2)
Total Protein: 6.7 g/dL (ref 6.5–8.1)

## 2021-05-08 LAB — PHOSPHORUS: Phosphorus: 3.1 mg/dL (ref 2.5–4.6)

## 2021-05-08 LAB — LACTIC ACID, PLASMA: Lactic Acid, Venous: 0.7 mmol/L (ref 0.5–1.9)

## 2021-05-08 LAB — APTT: aPTT: 35 seconds (ref 24–36)

## 2021-05-08 LAB — TYPE AND SCREEN
ABO/RH(D): B POS
Antibody Screen: NEGATIVE

## 2021-05-08 LAB — BRAIN NATRIURETIC PEPTIDE: B Natriuretic Peptide: 160 pg/mL — ABNORMAL HIGH (ref 0.0–100.0)

## 2021-05-08 LAB — PROCALCITONIN: Procalcitonin: 0.68 ng/mL

## 2021-05-08 LAB — PROTIME-INR
INR: 1.2 (ref 0.8–1.2)
Prothrombin Time: 15.1 seconds (ref 11.4–15.2)

## 2021-05-08 LAB — MAGNESIUM: Magnesium: 1.9 mg/dL (ref 1.7–2.4)

## 2021-05-08 MED ORDER — FUROSEMIDE 10 MG/ML IJ SOLN
40.0000 mg | Freq: Once | INTRAMUSCULAR | Status: AC
  Administered 2021-05-08: 40 mg via INTRAVENOUS
  Filled 2021-05-08: qty 4

## 2021-05-08 MED ORDER — KCL IN DEXTROSE-NACL 20-5-0.9 MEQ/L-%-% IV SOLN
INTRAVENOUS | Status: DC
Start: 2021-05-08 — End: 2021-05-12
  Filled 2021-05-08 (×9): qty 1000

## 2021-05-08 MED ORDER — METOCLOPRAMIDE HCL 5 MG/ML IJ SOLN
10.0000 mg | Freq: Four times a day (QID) | INTRAMUSCULAR | Status: DC
  Administered 2021-05-08 – 2021-05-22 (×56): 10 mg via INTRAVENOUS
  Filled 2021-05-08 (×56): qty 2

## 2021-05-08 MED ORDER — CLONIDINE HCL 0.2 MG/24HR TD PTWK
0.2000 mg | MEDICATED_PATCH | TRANSDERMAL | Status: DC
  Administered 2021-05-08 – 2021-05-22 (×3): 0.2 mg via TRANSDERMAL
  Filled 2021-05-08 (×3): qty 1

## 2021-05-08 MED ORDER — METOPROLOL TARTRATE 5 MG/5ML IV SOLN
5.0000 mg | Freq: Four times a day (QID) | INTRAVENOUS | Status: DC
  Administered 2021-05-08 – 2021-05-11 (×12): 5 mg via INTRAVENOUS
  Filled 2021-05-08 (×12): qty 5

## 2021-05-08 NOTE — Consult Note (Signed)
UNASSIGNED CONSULT ? ?Reason for Consult: Coffee-ground emesis and anemia ?Referring Physician: [facility] ? ?Stephen Delgado ?HPI: This is a 56 year old male with a PMH of intraventrucular hemorrhage ([id number]), multiple embolic CVAs, HTN, DM, and CKD originally admitted for a hypertensive urgency.  Work up revealed that he had a large intraventricular hemorrhage, intracranial hemorrhage, and chronic microischemic changes .  Over the course of his hospitalization he was stabilized and he received a trach in 12/2020.  A PEG tube was placed on 01/16/2021.  This past evening it was reported that he had several bouts of coffee-ground emesis. There was no change this his HGB and there was no report of any melena or hematochezia.  He did have some respiratory issues.  A CXR showed that he had a worsening of his atelectasis versus infiltrates in the base of his lungs.  Hemodynamically he remained stable.  The patient responded to an increase of his FiO2. ? ?Past Medical History:  ?Diagnosis Date  ? Acute respiratory failure ([place])   ? a. 12/2020 in setting of IVH/strokes-->s/p trach.  ? Allergy   ? Bilateral Embolic strokes (H[facility])   ? a. 01/2021 MRI: numerous sm acute infarcts in bilat cerebral hemispheres, Lt caudate hemorrhage, IVH, scattered sulcal SAH, mild communicating hydrocephalus.  ? CKD (chronic kidney disease), stage III ([place])   ? Diastolic dysfunction   ? a. 04/2015 Echo: EF 50-55%, mod LVH, no rwma, GrI DD, triv AI, + ASD; b. 12/2020 Echo: EF 50-55%, no rwma, grIDD, nl RV fxn, triv MR, mild AI, Asc Ao 64mm.  ? Hypertension   ? Intraventricular hemorrhage ([place])   ? a. 12/2020 Aucte L basal ganglia intraparenchymal hemorrhage w/ IVH in the setting of hypertensive emergency.  ? Type II diabetes mellitus ([place])   ? ? ?Past Surgical History:  ?Procedure Laterality Date  ? ESOPHAGOGASTRODUODENOSCOPY N/A 01/16/2021  ? Procedure: ESOPHAGOGASTRODUODENOSCOPY (EGD);  Surgeon: Georganna Skeans, MD;  Location: [facility];  Service: Endoscopy;  Laterality: N/A;  ? NO PAST SURGERIES    ? PEG PLACEMENT N/A 01/16/2021  ? Procedure: PERCUTANEOUS ENDOSCOPIC GASTROSTOMY (PEG) PLACEMENT;  Surgeon: Georganna Skeans, MD;  Location: [facility];  Service: Endoscopy;  Laterality: N/A;  ? ? ?Family History  ?Problem Relation Age of Onset  ? Stroke Paternal Uncle   ? ? ?Social History:  reports that he has never smoked. He has never used smokeless tobacco. He reports current alcohol use. He reports that he does not use drugs. ? ?Allergies: No Known Allergies ? ?Medications: Scheduled: ? chlorhexidine  15 mL Mouth Rinse BID  ? cloNIDine  0.2 mg Transdermal Weekly  ? feeding supplement (PROSource TF)  45 mL Per Tube BID  ? insulin aspart  0-20 Units Subcutaneous Q4H  ? mouth rinse  15 mL Mouth Rinse q12n4p  ? metoCLOPramide (REGLAN) injection  10 mg Intravenous Q6H  ? metoprolol tartrate  5 mg Intravenous Q6H  ? pantoprazole (PROTONIX) IV  40 mg Intravenous Q12H  ? polyvinyl alcohol  2 drop Left Eye Q6H  ? ?Continuous: ? dextrose 5 % and 0.9 % NaCl with KCl 20 mEq/L 75 mL/hr at 05/08/21 1143  ? methocarbamol (ROBAXIN) IV 500 mg (05/08/21 1456)  ? ? ?Results for orders placed or performed during the hospital encounter of 12/30/20 (from the past 24 hour(s))  ?Glucose, capillary     Status: Abnormal  ? Collection Time: 05/07/21  3:41 PM  ?Result Value Ref Range  ? Glucose-Capillary 157 (H) 70 - 99 mg/dL  ?  Glucose, capillary     Status: Abnormal  ? Collection Time: 05/07/21 11:59 PM  ?Result Value Ref Range  ? Glucose-Capillary 149 (H) 70 - 99 mg/dL  ? Comment 1 Notify RN   ? Comment 2 Document in Chart   ?CBC     Status: Abnormal  ? Collection Time: 05/08/21  3:38 AM  ?Result Value Ref Range  ? WBC 22.6 (H) 4.0 - 10.5 K/uL  ? RBC 3.76 (L) 4.22 - 5.81 MIL/uL  ? Hemoglobin 9.1 (L) 13.0 - 17.0 g/dL  ? HCT 29.3 (L) 39.0 - 52.0 %  ? MCV 77.9 (L) 80.0 - 100.0 fL  ? MCH 24.2 (L) 26.0 - 34.0 pg  ? MCHC 31.1 30.0 - 36.0 g/dL  ? RDW 17.5 (H) 11.5 -  15.5 %  ? Platelets 243 150 - 400 K/uL  ? nRBC 0.0 0.0 - 0.2 %  ?APTT     Status: None  ? Collection Time: 05/08/21  3:38 AM  ?Result Value Ref Range  ? aPTT 35 24 - 36 seconds  ?Protime-INR     Status: None  ? Collection Time: 05/08/21  3:38 AM  ?Result Value Ref Range  ? Prothrombin Time 15.1 11.4 - 15.2 seconds  ? INR 1.2 0.8 - 1.2  ?Comprehensive metabolic panel     Status: Abnormal  ? Collection Time: 05/08/21  3:38 AM  ?Result Value Ref Range  ? Sodium 143 135 - 145 mmol/L  ? Potassium 3.9 3.5 - 5.1 mmol/L  ? Chloride 116 (H) 98 - 111 mmol/L  ? CO2 19 (L) 22 - 32 mmol/L  ? Glucose, Bld 131 (H) 70 - 99 mg/dL  ? BUN 35 (H) 6 - 20 mg/dL  ? Creatinine, Ser 1.47 (H) 0.61 - 1.24 mg/dL  ? Calcium 9.3 8.9 - 10.3 mg/dL  ? Total Protein 6.7 6.5 - 8.1 g/dL  ? Albumin 2.1 (L) 3.5 - 5.0 g/dL  ? AST 39 15 - 41 U/L  ? ALT 68 (H) 0 - 44 U/L  ? Alkaline Phosphatase 52 38 - 126 U/L  ? Total Bilirubin 0.5 0.3 - 1.2 mg/dL  ? GFR, Estimated 55 (L) >60 mL/min  ? Anion gap 8 5 - 15  ?Magnesium     Status: None  ? Collection Time: 05/08/21  3:38 AM  ?Result Value Ref Range  ? Magnesium 1.9 1.7 - 2.4 mg/dL  ?Brain natriuretic peptide     Status: Abnormal  ? Collection Time: 05/08/21  3:38 AM  ?Result Value Ref Range  ? B Natriuretic Peptide 160.0 (H) 0.0 - 100.0 pg/mL  ?Phosphorus     Status: None  ? Collection Time: 05/08/21  3:38 AM  ?Result Value Ref Range  ? Phosphorus 3.1 2.5 - 4.6 mg/dL  ?Procalcitonin - Baseline     Status: None  ? Collection Time: 05/08/21  3:38 AM  ?Result Value Ref Range  ? Procalcitonin 0.68 ng/mL  ?Type and screen [place]     Status: None  ? Collection Time: 05/08/21  3:40 AM  ?Result Value Ref Range  ? ABO/RH(D) B POS   ? Antibody Screen NEG   ? Sample Expiration    ?  05/11/2021,2359 ?Performed at [facility], [place] [address] [place], [place] [address] ?  ?Blood gas, arterial     Status: Abnormal  ? Collection Time: 05/08/21  4:13 AM  ?Result Value Ref Range  ? pH, Arterial  7.45 7.35 - 7.45  ? pCO2  arterial 29 (L) 32 - 48 mmHg  ? pO2, Arterial 133 (H) 83 - 108 mmHg  ? Bicarbonate 19.9 (L) 20.0 - 28.0 mmol/L  ? Acid-base deficit 3.2 (H) 0.0 - 2.0 mmol/L  ? O2 Saturation 99.1 %  ? Patient temperature 37.9   ? Collection site RIGHT RADIAL   ? Allens test (pass/fail) PASS PASS  ?Glucose, capillary     Status: Abnormal  ? Collection Time: 05/08/21  5:01 AM  ?Result Value Ref Range  ? Glucose-Capillary 119 (H) 70 - 99 mg/dL  ?Glucose, capillary     Status: Abnormal  ? Collection Time: 05/08/21  7:27 AM  ?Result Value Ref Range  ? Glucose-Capillary 112 (H) 70 - 99 mg/dL  ? Comment 1 Notify RN   ? Comment 2 Document in Chart   ?Lactic acid, plasma     Status: None  ? Collection Time: 05/08/21  9:57 AM  ?Result Value Ref Range  ? Lactic Acid, Venous 0.7 0.5 - 1.9 mmol/L  ?Glucose, capillary     Status: Abnormal  ? Collection Time: 05/08/21 12:46 PM  ?Result Value Ref Range  ? Glucose-Capillary 119 (H) 70 - 99 mg/dL  ? Comment 1 Notify RN   ? Comment 2 Document in Chart   ?  ? ?DG Abd 1 View ? ?Result Date: 05/07/2021 ?CLINICAL DATA:  Possible ileus EXAM: ABDOMEN - 1 VIEW COMPARISON:  05/04/2021 FINDINGS: Scattered large and small bowel gas is noted. No dilated loops of large or small bowel are seen. No free air is noted. Gastrostomy catheter is noted in place. IMPRESSION: No obstructive changes are noted. Electronically Signed   By: Inez Catalina M.D.   On: 05/07/2021 23:44  ? ?DG CHEST PORT 1 VIEW ? ?Result Date: 05/08/2021 ?CLINICAL DATA:  Tracheostomy.  Nausea and vomiting EXAM: PORTABLE CHEST 1 VIEW COMPARISON:  Three days ago FINDINGS: Tracheostomy tube in place. Indistinct density at the bases on both sides. No edema, effusion, or air leak. Enlarged heart size. IMPRESSION: Atelectasis or infiltrates at the bases, progressed from 3 days ago. Electronically Signed   By: Jorje Guild M.D.   On: 05/08/2021 06:46   ? ?ROS:  As stated above in the HPI otherwise negative. ? ?Blood pressure (!)  149/76, pulse 97, temperature 99 ?F (37.2 ?C), temperature source Axillary, resp. rate 18, height 6' (1.829 m), weight 111.1 kg, SpO2 99 %.   ? ?PE: ?Gen: unresponsive ?Neck: trach collar ?Lungs: CTA Bilaterally ?CV

## 2021-05-08 NOTE — Progress Notes (Addendum)
RT called to bedside to assess pt. For respiratory distress. Pt. HR and RR are elevated switched pt. From AIR to oxygen and rapid was called. Pt. Has had increase in bloody secretion tonight. Pt. Airways is patent and trach care was done again and also inner cannula was changed.  ?

## 2021-05-08 NOTE — Progress Notes (Addendum)
?Progress Note ?  ?  ?Patient: Stephen Delgado [id number] DOB: 1965-11-25 DOA: 12/30/2020     122 ?DOS: the patient was seen and examined on 05/01/2021 ?  ?  ?  ?Brief hospital course: ?56 y.o. male with PMH significant for DM2, HTN, CKD with questionable compliance to medications ?Patient presented to the ED on 12/30/2020 with complaint of headache, altered mental status ?  ?In the ED, he was hypertensive to 238/160, agitated, required restraints ?CT head showed large amount of intraventricular hemorrhage involving lateral/third/fourth ventricle, ICH and left basal ganglia as well as chronic microvascular ischemic changes. ?He was started on Cleviprex drip and admitted to neuro ICU ?  ?He subsequently had CT head repeated on 11/30, 12/1 and 12/3, all demonstrated unchanged intraparenchymal hemorrhage and intraventricular hemorrhage ?12/3, MRI brain from also showed numerous small acute infarcts in both cerebral hemisphere with minimal improvement in the right superior cerebellum ?12/3, intubated ?12/13, tracheostomy ?12/15, wean from vent to trach collar ?12/16, PEG tube placement ?12/16, repeat MRI showed an increase in the size and number of acute infarcts in the bilateral hemispheres with increased involvement in the cerebellum bilaterally.   ?12/18, transferred from neuro service to [facility]  ?12/21, palliative care consulted.  Family wanted to continue aggressive care as full CODE STATUS. ?12/23 developed Cheyne-Stokes respiratory pattern with respiratory alkalosis.  Transferred to ICU for short-term mechanical ventilation and was found to have new tracheobronchitis now on cefepime ?12/25 back to progressive floor and to TRH ?  ?His hospital course has been complicated by significant neurological impairment, aspiration events, AKI, hypernatremia, urinary retention. ?1/9 trach changed to #6.0 [place] cuffless--1/10 > 30 days since trach inserted. ?  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  Staff educating family regarding the care of this patient including suctioning, administration of medications and tube feedings ?  ?End-of-life discussion ?Given recurrent issues of emesis and ileus type symptoms with no clear etiology discussed with wife that this is a poor prognostic indicator.  She stated that she noticed he did better with a lower rate of tube feeding but I reminded her that this is not enough to provide adequate nutrition and sustain life.  I encouraged her and her family to have a discussion regarding this since her husband currently is a full code and if something happen we would have to resuscitate him.  I also reported that I was asking the gastroenterology service to evaluate patient since the emesis looked like it may have component contained old blood.  I did educate her that patient was already on twice daily Protonix which should be preventing any ulcers.  Given patient's poor clinical status and prognosis I told her that the gastroenterology team may be reluctant to proceed with any type of endoscopic evaluation.  I let her know that he was only running low-grade fevers with a white count had increased so I was performing another infectious work-up despite having just completed cefepime for UTI and pneumonia. ? ?A physical therapy consult is indicated based on the patient?s mobility assessment. ?  ?  ?Mobility Assessment (last 72 hours)   ?  ?  Mobility Assessment   ?  ?  [place] Name 03/29/21 2000 03/29/21 1800 03/29/21 1400 03/29/21 1200 03/29/21 0800  ?  Does patient have an order for bedrest or  is patient medically unstable Yes- Bedfast (Level 1) - Complete Yes- Bedfast (Level 1) - Complete Yes- Bedfast (Level 1) - Complete Yes- Bedfast (Level 1) - Complete Yes- Bedfast (Level 1) - Complete  ?  What is the highest level of mobility based on the progressive mobility assessment? Level 1 (Bedfast) - Unable to balance while sitting on edge of bed -- Level 1 (Bedfast) - Unable to balance while sitting on edge of bed Level 1 (Bedfast) - Unable to balance while sitting on edge of bed Level 1 (Bedfast) - Unable to balance while sitting on edge of bed  ?  Is the above level different from baseline mobility prior to current illness? No - Consider discontinuing PT/OT -- Yes - Recommend PT order Yes - Recommend PT order Yes - Recommend PT order  ?  ?  [place] Name 03/28/21 2000 03/28/21 0800   ?    ?    ?   ?  Does patient have an order for bedrest or is patient medically unstable Yes- Bedfast (Level 1) - Complete Yes- Bedfast (Level 1) - Complete        ?  What is the highest level of mobility based on the progressive mobility assessment? Level 1 (Bedfast) - Unable to balance while sitting on edge of bed Level 1 (Bedfast) - Unable to balance while sitting on edge of bed        ?  Is the above level different from baseline mobility prior to current illness? No - Consider discontinuing PT/OT No - Consider discontinuing PT/OT        ?  ?   ?  ?  ?   ?  ?  ?  ?Ileus with hypokalemia (resolved) ?Patient with significant ileus likely related to acute UTI with concomitant pneumonia ?Gastric tube placed to straight drain for greater than 24 hours ?Follow-up KUB on 3/31 reveals resolution of ileus ?Patient tolerating slow titration upward of tube feeding which is now at 55 cc/h-suspect combination of type of tube feeding (tube feeding changed at recommendation of nutrition) and high volume free water contributed to emesis ?4/6 into 4/7 had another episode of emesis and apparently intolerance to tube feeding.  Tube feeding  discontinued.  Appropriate medications transition to IV.  Reglan increased to 10 mg every 6 hours.   ?Given recent anemia patient could be experiencing ulcer or other issues.  Consider GI consultation.  Continue Protonix every 12 hours. ?  ?Acute otitis media, right with mastoid effusion-resolved as of 04/30/2021 ?Patient with recent bilateral conjunctivitis that persisted over 2 weeks despite pharmacological therapy ?Maxillofacial CT revealed new right middle ear and mastoid opacification consistent with otitis media with mastoid effusion ?Will need follow-up CT to evaluate for resolution.  If not resolved consideration should be given to ENT evaluation to determine if a candidate for myringotomy tube --Right TM unremarkable and without signs of infection when examined.  ? ?Hypernatremia with metabolic acidosis/hypokalemia/mild acute kidney injury ?Likely related to significant dehydration ?D5W maintenance fluids increased to 100 cc/h on 4/5 and today 4/6 sodium down to 146.  Potassium 3.5.  CO2 up to 20 with hydration and utilization of per tube bicarb, albumin stable at 2.0, for still low at 1.6 so in addition to per tube phosphorus we will give one-time dose of K-Phos 30 mmol IV given on 4/6 ?Event recurrent  emesis per tube potassium phosphate discontinued ?As of 4/7 phosphorus had normalized to 3.1 ? ?Microcytic anemia ?As of 4/3 hgb down to 6.9 ?After 2 units PRBCs on 4/3 hemoglobin up to 10 ?Iron low at 32 with a low TIBC and elevated ferritin.  Discussed with attending at this time we will hold off on giving iron- patient did get iron replacement with the packed red blood cells ?As of 4/5 hemoglobin up to 9.2 after transfusion ?Patient had coffee-ground emesis overnight in context of apparent intolerance to tube feedings once again.  Abdominal x-ray unrevealing.  Hemoccult pending.  Hemoglobin stable.  Call placed to [place] for consultation ? ?Leukocytosis ?Noted without left shift although mildly  elevated neutrophils and monocytes ?Chest x-ray 4/4 unremarkable ?WBCs have increased to 22,600 with low-grade fevers.  As a precaution have initiated infectious work-up.  Chest x-ray with either increased atelect

## 2021-05-08 NOTE — TOC Progression Note (Signed)
Transition of Care (TOC) - Progression Note  ? ? ?Patient Details  ?Name: Stephen Delgado ?MRN: [id number] ?Date of Birth: 12-09-1965 ? ?Transition of Care (TOC) CM/SW Contact  ?[name], RN ?Phone Number: ?05/08/2021, 1:15 PM ? ?Clinical Narrative:    ?CM spoke with Dartha Lodge, RNCM with [facility] and she was updated regarding patient's current health conditions per Junious Silk, NP notes.  The patient is not medically stable for discharge to home at this time.  I spoke with Burman Nieves, RNCM with [facility] and insurance authorization for private duty has been approved for pending discharge date scheduled for 05/19/2021 if patient is medically stable to discharge to home at this time.  Trach teaching is being coordinated through [employer], RT and she has already reached out to the patient's family for teaching next week. ? ?[facility] will have private duty availability the week of 05/19/2021 due to the holiday and also patient is not medically stable at this time. ? ?[employer] will be providing dme and CM and MSW will follow up with dme company closer to discharge to home - however dme orders have already been placed and [employer] is aware of pending need for the equipment. ? ?CM and MSW with DTP Team will continue to follow the patient for discharge needs. ? ? ?Expected Discharge Plan: Home w Home Health Services ?Barriers to Discharge: Continued Medical Work up, No SNF bed ? ?Expected Discharge Plan and Services ?Expected Discharge Plan: Home w Home Health Services ?In-house Referral: Clinical Social Work, Hospice / Palliative Care, PCP / Health Connect ?Discharge Planning Services: CM Consult ?Post Acute Care Choice: Home Health ?Living arrangements for the past 2 months: Single Family Home ?                ?DME Arranged: Hospital bed, Trach supplies, Tube feeding, Oxygen ?DME Agency: [employer] ?Date DME Agency Contacted: 03/18/21 ?Time DME Agency Contacted: 1000 ?Representative spoke  with at DME Agency: Jenness Corner, CM with [employer] ?  ?  ?  ?  ?  ? ? ?Social Determinants of Health (SDOH) Interventions ?  ? ?Readmission Risk Interventions ? ?  02/20/2021  ?  9:31 AM  ?Readmission Risk Prevention Plan  ?Transportation Screening Complete  ?Medication Review Oceanographer) Complete  ?PCP or Specialist appointment within 3-5 days of discharge Not Complete  ?PCP/Specialist Appt Not Complete comments Patient will need LTC placement at SNF facility.  ?HRI or Home Care Consult Complete  ?SW Recovery Care/Counseling Consult Complete  ?Palliative Care Screening Complete  ?Skilled Nursing Facility Complete  ? ? ?

## 2021-05-08 NOTE — Progress Notes (Signed)
Pt triggered red mews. RR 62 and labored, HR 120s. Pt suctioned by RT, PRN labetalol given with mild effectiveness. RRT notified. MD notified.  ? ?Lung sounds rhonchus. New orders placed.  ? ? 05/08/21 0150  ?Assess: MEWS Score  ?Temp 99.9 ?F (37.7 ?C)  ?BP (!) 188/88  ?Pulse Rate (!) 121  ?Resp (!) 62  ?Level of Consciousness Alert  ?SpO2 98 %  ?O2 Device Tracheostomy Collar  ?Patient Activity (if Appropriate) In bed  ?O2 Flow Rate (L/min) 28 L/min  ?Assess: MEWS Score  ?MEWS Temp 0  ?MEWS Systolic 0  ?MEWS Pulse 2  ?MEWS RR 3  ?MEWS LOC 0  ?MEWS Score 5  ?MEWS Score Color Red  ?Assess: if the MEWS score is Yellow or Red  ?Were vital signs taken at a resting state? Yes  ?Focused Assessment Change from prior assessment (see assessment flowsheet)  ?Early Detection of Sepsis Score *See Row Information* High  ?MEWS guidelines implemented *See Row Information* Yes  ?Treat  ?MEWS Interventions Administered prn meds/treatments;Escalated (See documentation below)  ?Pain Scale PAINAD  ?Breathing 1  ?Negative Vocalization 0  ?Facial Expression 0  ?Body Language 0  ?Consolability 0  ?PAINAD Score 1  ?Take Vital Signs  ?Increase Vital Sign Frequency  Red: Q 1hr X 4 then Q 4hr X 4, if remains red, continue Q 4hrs  ?Escalate  ?MEWS: Escalate Red: discuss with charge nurse/RN and provider, consider discussing with RRT  ?Notify: Charge Nurse/RN  ?Name of Charge Nurse/RN Notified Webb Silversmith lowe  ?Date Charge Nurse/RN Notified 05/08/21  ?Time Charge Nurse/RN Notified 0150  ?Notify: Provider  ?Provider Name/Title [facility]  ?Date Provider Notified 05/08/21  ?Time Provider Notified [phone]  ?Notification Type Page  ?Notification Reason Change in status  ?Provider response Other (Comment)  ?Date of Provider Response 05/08/21  ?Time of Provider Response 0240  ?Notify: Rapid Response  ?Name of Rapid Response RN Notified 0150  ?Date Rapid Response Notified 05/08/21  ?Time Rapid Response Notified [id number]  ?Document  ?Patient Outcome Stabilized after  interventions  ?Progress note created (see row info) Yes  ? ? ?

## 2021-05-09 LAB — CBC
HCT: 28.5 % — ABNORMAL LOW (ref 39.0–52.0)
Hemoglobin: 8.9 g/dL — ABNORMAL LOW (ref 13.0–17.0)
MCH: 24.3 pg — ABNORMAL LOW (ref 26.0–34.0)
MCHC: 31.2 g/dL (ref 30.0–36.0)
MCV: 77.7 fL — ABNORMAL LOW (ref 80.0–100.0)
Platelets: 240 10*3/uL (ref 150–400)
RBC: 3.67 MIL/uL — ABNORMAL LOW (ref 4.22–5.81)
RDW: 17.6 % — ABNORMAL HIGH (ref 11.5–15.5)
WBC: 14.1 10*3/uL — ABNORMAL HIGH (ref 4.0–10.5)
nRBC: 0 % (ref 0.0–0.2)

## 2021-05-09 LAB — COMPREHENSIVE METABOLIC PANEL
ALT: 52 U/L — ABNORMAL HIGH (ref 0–44)
AST: 27 U/L (ref 15–41)
Albumin: 2.1 g/dL — ABNORMAL LOW (ref 3.5–5.0)
Alkaline Phosphatase: 50 U/L (ref 38–126)
Anion gap: 9 (ref 5–15)
BUN: 29 mg/dL — ABNORMAL HIGH (ref 6–20)
CO2: 19 mmol/L — ABNORMAL LOW (ref 22–32)
Calcium: 9.3 mg/dL (ref 8.9–10.3)
Chloride: 117 mmol/L — ABNORMAL HIGH (ref 98–111)
Creatinine, Ser: 1.37 mg/dL — ABNORMAL HIGH (ref 0.61–1.24)
GFR, Estimated: >60 mL/min (ref >60)
Glucose, Bld: 115 mg/dL — ABNORMAL HIGH (ref 70–99)
Potassium: 3.5 mmol/L (ref 3.5–5.1)
Sodium: 145 mmol/L (ref 135–145)
Total Bilirubin: 0.9 mg/dL (ref 0.3–1.2)
Total Protein: 6.5 g/dL (ref 6.5–8.1)

## 2021-05-09 LAB — GLUCOSE, CAPILLARY
Glucose-Capillary: 102 mg/dL — ABNORMAL HIGH (ref 70–99)
Glucose-Capillary: 113 mg/dL — ABNORMAL HIGH (ref 70–99)
Glucose-Capillary: 118 mg/dL — ABNORMAL HIGH (ref 70–99)
Glucose-Capillary: 117 mg/dL — ABNORMAL HIGH (ref 70–99)
Glucose-Capillary: 120 mg/dL — ABNORMAL HIGH (ref 70–99)

## 2021-05-09 LAB — PROCALCITONIN: Procalcitonin: 0.59 ng/mL

## 2021-05-09 NOTE — Progress Notes (Signed)
?PROGRESS NOTE ? ? ? Stephen Delgado  WPY:[id number] DOB: Mar 14, 1965 DOA: 12/30/2020 ?PCP: Patient, [name]  ? ? ?Brief Narrative:  ?56 y.o. male with PMH significant for DM2, HTN, CKD with questionable compliance to medications ?Patient presented to the ED on 12/30/2020 with complaint of headache, altered mental status ?  ?In the ED, he was hypertensive to 238/160, agitated, required restraints ?CT head showed large amount of intraventricular hemorrhage involving lateral/third/fourth ventricle, ICH and left basal ganglia as well as chronic microvascular ischemic changes. ?He was started on Cleviprex drip and admitted to neuro ICU ?  ?He subsequently had CT head repeated on 11/30, 12/1 and 12/3, all demonstrated unchanged intraparenchymal hemorrhage and intraventricular hemorrhage ?12/3, MRI brain from also showed numerous small acute infarcts in both cerebral hemisphere with minimal improvement in the right superior cerebellum ?12/3, intubated ?12/13, tracheostomy ?12/15, wean from vent to trach collar ?12/16, PEG tube placement ?12/16, repeat MRI showed an increase in the size and number of acute infarcts in the bilateral hemispheres with increased involvement in the cerebellum bilaterally.   ?12/18, transferred from neuro service to [facility]  ?12/21, palliative care consulted.  Family wanted to continue aggressive care as full CODE STATUS. ?12/23 developed Cheyne-Stokes respiratory pattern with respiratory alkalosis.  Transferred to ICU for short-term mechanical ventilation and was found to have new tracheobronchitis now on cefepime ?12/25 back to progressive floor and to TRH ?  ?His hospital course has been complicated by significant neurological impairment, aspiration events, AKI, hypernatremia, urinary retention. ?1/9 trach changed to #6.0 Shiley cuffless--1/10 > 30 days since trach inserted.  ? ?  ?Assessment and Plan: ? 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  Plan is discharged home with home health. ? ?Ileus with hypokalemia with possible hematemesis ?We will continue to replenish potassium.  Patient with history of acute UTI and recent pneumonia.  Continue Reglan for now.  Continue Protonix twice a day.  GI has seen the patient and recommended conservative treatment.  No further report of vomiting. ? 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  Continue trach care, Mucomyst, completed course of cefepime.  Blood cultures negative in 1 day.  Mild leukocytosis at 14.1 trended down from 22 K..  Temperature max of 99.6 Fahrenheit.  Follow urine cultures.  Had several antibiotics in the past. ? ?Malignant hypertension ?Patient is on Norvasc, Coreg, hydralazine and clonidine.  Due to ileus was put on IV labetalol and hydralazine.  Continue IV Lopressor to prevent beta-blocker withdrawal.  On topical clonidine  ? ?Diabetes mellitus type 2 in obese [facility]) ?Hemoglobin A1c of 6.3 on 12/31/2020.  Continue sliding scale insulin. Tradjenta, Glucotrol on hold. ? ?Acute kidney injury on CKD 3b/acute azotemia with mild hypernatremia ?BUN elevated at 55 with normal creatinine of 1.2 and a GFR greater than 60 ? ?hypernatremia ?Improved today.  Received water flushes and D5 water  ? ?Anemia ?Latest hemoglobin of 8.9.  We will continue to monitor.   ? ?Acute otitis media, right with mastoid effusion-resolved as of 04/30/2021 ?Patient with recent bilateral conjunctivitis that persisted over 2 weeks despite pharmacological therapy ?Maxillofacial CT revealed new right middle ear and mastoid opacification consistent with otitis media with mastoid effusion ? ?Acute conjunctivitis,  bilateral ?Resolved ? ?Constipation ?CT with significant colonic  stool retention.  Was on laxatives, currently on hold due to ileus. ? ?Protein-calorie malnutrition/dysphagia due to recent stroke/constipation ?Continue tube feeding.  ? ?Mild elevated LFT. ?Monitor. ? ? Enterobacter UTI ? 3/28 Urine cx positve for > 100,000 colonies of Enterobacter -received cefepime. ? ?Pneumonia due to Klebsiella pneumoniae (HCC)-resolved as of 04/01/2021 ? ?Pressure injury of skin-resolved as of 04/02/2021 ?Resolved-prior anus ulcer ? ?Acute urinary retention-resolved as of 03/30/2021 ?-Continue Urecholine ?-Foley catheter discontinued on 1/11 and patient voiding easily ? ?MSSA (methicillin susceptible Staphylococcus aureus) pneumonia (HCC)-resolved as of 04/02/2021 ? ?Pneumonia due to Serratia marcescens (HCC)-resolved as of 04/01/2021 ?Resolved ? ?End-of-life discussion ?Family aware of poor prognostic situation.  Currently full code.   ? ? DVT prophylaxis: Place and maintain sequential compression device Start: 05/07/21 2308 ? ? ?Code Status:   ?  Code Status: Full Code ? ?Disposition: Unknown at this time. ? ?Status is: Inpatient ? ?Remains inpatient appropriate because: Difficult disposition. ? ? Family Communication: None ? ?Consultants:  ?Neurology ?PCCM ?Palliative medicine ? ?Procedures:  ?EEG ?Cortrack ?PEG tube placement ?Tracheostomy tube placement ? ?Antimicrobials:  ?None ? ?Anti-infectives (From admission, onward)  ? ? Start     Dose/Rate Route Frequency Ordered Stop  ? 04/28/21 1545  ceFEPIme (MAXIPIME) 2 g in sodium chloride 0.9 % 100 mL IVPB       ? 2 g ?200 mL/hr over 30 Minutes Intravenous Every 8 hours 04/28/21 1446 05/05/21 0555  ? 04/27/21 1400  cefTRIAXone (ROCEPHIN) 1 g in sodium chloride 0.9 % 100 mL IVPB  Status:  Discontinued       ? 1 g ?200 mL/hr over 30 Minutes Intravenous Every 24 hours 04/27/21 1306 04/28/21 1302  ? 02/03/21 2200  ceFEPIme (MAXIPIME) 2 g in sodium chloride 0.9 % 100 mL IVPB       ? 2 g ?200  mL/hr over 30 Minutes Intravenous Every 8 hours 02/03/21 1153 02/05/21 2142  ? 01/30/21 1000  ceFEPIme (MAXIPIME) 2 g in sodium chloride 0.9 % 100 mL IVPB  Status:  Discontinued       ? 2 g ?200 mL/hr over 30 Minutes Intravenous Every 12 hours 01/30/21 0832 02/03/21 1153  ? 01/27/21 1900  sulfamethoxazole-trimethoprim (BACTRIM DS) 800-160 MG per tablet 1 tablet  Status:  Discontinued       ? 1 tablet Per Tube Every 12 hours 01/27/21 1403 01/30/21 0808  ? 01/24/21 1100  ceFEPIme (MAXIPIME) 2 g in sodium chloride 0.9 % 100 mL IVPB  Status:  Discontinued       ? 2 g ?200 mL/hr over 30 Minutes Intravenous Every 8 hours 01/24/21 1025 01/27/21 1403  ? 01/15/21 1630  piperacillin-tazobactam (ZOSYN) IVPB 3.375 g       ? 3.375 g ?12.5 mL/hr over 240 Minutes Intravenous Every 8 hours 01/15/21 0943 01/22/21 0400  ? 01/15/21 1030  piperacillin-tazobactam (ZOSYN) IVPB 3.375 g       ? 3.375 g ?100 mL/hr over 30 Minutes Intravenous  Once 01/15/21 0943 01/15/21 1035  ? 01/05/21 1600  ceFAZolin (ANCEF) IVPB 2g/100 mL premix       ? 2 g ?200 mL/hr over 30 Minutes Intravenous Every 8 hours 01/05/21 0853 01/10/21 0848  ? 01/03/21 0830  piperacillin-tazobactam (ZOSYN) IVPB 3.375 g  Status:  Discontinued       ? 3.375 g ?12.5 mL/hr over 240 Minutes Intravenous Every 8 hours 01/03/21 0800 01/05/21 0853  ? ?  ? ?Subjective: ?Today, patient was seen and examined at bedside.Marland Kitchen.  No  mention of nausea vomiting today.  Patient is unresponsive.   ? ?Objective: ?Vitals:  ? 05/09/21 8416 05/09/21 0335 05/09/21 0840 05/09/21 0913  ?BP:  (!) 160/85 (!) 150/86   ?Pulse: 96 97 91 90  ?Resp: 20 20 16 20   ?Temp:  99.5 ?F (37.5 ?C) 98.9 ?F (37.2 ?C)   ?TempSrc:  Oral Oral   ?SpO2: 100% 99% 100% 99%  ?Weight:      ?Height:      ? ? ?Intake/Output Summary (Last 24 hours) at 05/09/2021 1104 ?Last data filed at 05/09/2021 0901 ?Gross per 24 hour  ?Intake 3302.36 ml  ?Output 1350 ml  ?Net 1952.36 ml  ? ?Filed Weights  ? 03/16/21 0500 04/13/21 1700 04/28/21 0500   ?Weight: 83.5 kg 108 kg 111.1 kg  ? ? ?Physical Examination: ? ?General: unresponsive,  ?HENT:   No scleral pallor or icterus noted. Oral mucosa is moist.  Trach collar. ?Chest:  .  Diminished breath sounds bilaterally.  Occasional rhonchi noted.

## 2021-05-09 NOTE — Progress Notes (Signed)
Subjective: ?No acute events. ? ?Objective: ?Vital signs in last 24 hours: ?Temp:  [98 ?F (36.7 ?C)-99.6 ?F (37.6 ?C)] 99.5 ?F (37.5 ?C) (04/08 0335) ?Pulse Rate:  [90-107] 97 (04/08 0335) ?Resp:  [17-30] 20 (04/08 0335) ?BP: (149-178)/(69-100) 160/85 (04/08 0335) ?SpO2:  [98 %-100 %] 99 % (04/08 0335) ?FiO2 (%):  [28 %] 28 % (04/08 0335) ?Last BM Date : 05/07/21 ? ?Intake/Output from previous day: ?04/07 0701 - 04/08 0700 ?In: 2806.1 [I.V.:2606.1; IV Piggyback:200] ?Out: 1350 [id number] ?Intake/Output this shift: ?No intake/output data recorded. ? ?General appearance: awake, but not responsive ?GI: soft, non-tender; bowel sounds normal; no masses,  no organomegaly ? ?Lab Results: ?Recent Labs  ?  05/07/21 ?0143 05/08/21 ?[id number] 05/09/21 ?0216  ?WBC 15.6* 22.6* 14.1*  ?HGB 8.7* 9.1* 8.9*  ?HCT 28.1* 29.3* 28.5*  ?PLT 227 243 240  ? ?BMET ?Recent Labs  ?  05/07/21 ?0143 05/08/21 ?0338 05/09/21 ?0216  ?NA 146* 143 145  ?K 3.5 3.9 3.5  ?CL 122* 116* 117*  ?CO2 20* 19* 19*  ?GLUCOSE 173* 131* 115*  ?BUN 33* 35* 29*  ?CREATININE 1.52* 1.47* 1.37*  ?CALCIUM 9.3 9.3 9.3  ? ?LFT ?Recent Labs  ?  05/09/21 ?0216  ?PROT 6.5  ?ALBUMIN 2.1*  ?AST 27  ?ALT 52*  ?ALKPHOS 50  ?BILITOT 0.9  ? ?PT/INR ?Recent Labs  ?  05/08/21 ?0338  ?LABPROT 15.1  ?INR 1.2  ? ?Hepatitis Panel ?No results for input(s): HEPBSAG, HCVAB, HEPAIGM, HEPBIGM in the last 72 hours. ?C-Diff ?No results for input(s): CDIFFTOX in the last 72 hours. ?Fecal Lactopherrin ?No results for input(s): FECLLACTOFRN in the last 72 hours. ? ?Studies/Results: ?DG Abd 1 View ? ?Result Date: 05/07/2021 ?CLINICAL DATA:  Possible ileus EXAM: ABDOMEN - 1 VIEW COMPARISON:  05/04/2021 FINDINGS: Scattered large and small bowel gas is noted. No dilated loops of large or small bowel are seen. No free air is noted. Gastrostomy catheter is noted in place. IMPRESSION: No obstructive changes are noted. Electronically Signed   By: Inez Catalina M.D.   On: 05/07/2021 23:44  ? ?DG CHEST PORT 1  VIEW ? ?Result Date: 05/08/2021 ?CLINICAL DATA:  Tracheostomy.  Nausea and vomiting EXAM: PORTABLE CHEST 1 VIEW COMPARISON:  Three days ago FINDINGS: Tracheostomy tube in place. Indistinct density at the bases on both sides. No edema, effusion, or air leak. Enlarged heart size. IMPRESSION: Atelectasis or infiltrates at the bases, progressed from 3 days ago. Electronically Signed   By: Jorje Guild M.D.   On: 05/08/2021 06:46   ? ?Medications: Scheduled: ? chlorhexidine  15 mL Mouth Rinse BID  ? cloNIDine  0.2 mg Transdermal Weekly  ? feeding supplement (PROSource TF)  45 mL Per Tube BID  ? insulin aspart  0-20 Units Subcutaneous Q4H  ? mouth rinse  15 mL Mouth Rinse q12n4p  ? metoCLOPramide (REGLAN) injection  10 mg Intravenous Q6H  ? metoprolol tartrate  5 mg Intravenous Q6H  ? pantoprazole (PROTONIX) IV  40 mg Intravenous Q12H  ? polyvinyl alcohol  2 drop Left Eye Q6H  ? ?Continuous: ? dextrose 5 % and 0.9 % NaCl with KCl 20 mEq/L 75 mL/hr at 05/08/21 2352  ? methocarbamol (ROBAXIN) IV 500 mg (05/09/21 0509)  ? ? ?Assessment/Plan: ?1) Coffee-ground emesis. ?2) Anemia - unchanged. ? ? The patient did not exhibit any further GI bleeding issues.  Nursing did not report any problems with coffee-ground emesis or any bowel movements to check a hemoccult. ? ?Plan: ?1) Continue with  pantoprazole. ?2) Signing off. ? LOS: 130 days  ? ?Laquinda Moller D ?05/09/2021, 7:26 AM  ?

## 2021-05-10 LAB — GLUCOSE, CAPILLARY
Glucose-Capillary: 106 mg/dL — ABNORMAL HIGH (ref 70–99)
Glucose-Capillary: 111 mg/dL — ABNORMAL HIGH (ref 70–99)
Glucose-Capillary: 120 mg/dL — ABNORMAL HIGH (ref 70–99)
Glucose-Capillary: 126 mg/dL — ABNORMAL HIGH (ref 70–99)
Glucose-Capillary: 130 mg/dL — ABNORMAL HIGH (ref 70–99)
Glucose-Capillary: 97 mg/dL (ref 70–99)

## 2021-05-10 LAB — CBC
HCT: 26.4 % — ABNORMAL LOW (ref 39.0–52.0)
Hemoglobin: 8 g/dL — ABNORMAL LOW (ref 13.0–17.0)
MCH: 24.4 pg — ABNORMAL LOW (ref 26.0–34.0)
MCHC: 30.3 g/dL (ref 30.0–36.0)
MCV: 80.5 fL (ref 80.0–100.0)
Platelets: 221 10*3/uL (ref 150–400)
RBC: 3.28 MIL/uL — ABNORMAL LOW (ref 4.22–5.81)
RDW: 17.3 % — ABNORMAL HIGH (ref 11.5–15.5)
WBC: 10.4 10*3/uL (ref 4.0–10.5)
nRBC: 0 % (ref 0.0–0.2)

## 2021-05-10 LAB — BASIC METABOLIC PANEL
Anion gap: 7 (ref 5–15)
BUN: 21 mg/dL — ABNORMAL HIGH (ref 6–20)
CO2: 18 mmol/L — ABNORMAL LOW (ref 22–32)
Calcium: 9.2 mg/dL (ref 8.9–10.3)
Chloride: 125 mmol/L — ABNORMAL HIGH (ref 98–111)
Creatinine, Ser: 1.2 mg/dL (ref 0.61–1.24)
GFR, Estimated: >60 mL/min (ref >60)
Glucose, Bld: 124 mg/dL — ABNORMAL HIGH (ref 70–99)
Potassium: 3.4 mmol/L — ABNORMAL LOW (ref 3.5–5.1)
Sodium: 150 mmol/L — ABNORMAL HIGH (ref 135–145)

## 2021-05-10 LAB — MAGNESIUM: Magnesium: 1.9 mg/dL (ref 1.7–2.4)

## 2021-05-10 LAB — PROCALCITONIN: Procalcitonin: 0.34 ng/mL

## 2021-05-10 MED ORDER — OSMOLITE 1.5 CAL PO LIQD
1000.0000 mL | ORAL | Status: DC
  Administered 2021-05-10 – 2021-05-21 (×14): 1000 mL
  Filled 2021-05-10 (×8): qty 1000

## 2021-05-10 NOTE — Progress Notes (Signed)
?PROGRESS NOTE ? ? ? Stephen Delgado  [id number] DOB: September 18, 1965 DOA: 12/30/2020 ?PCP: Patient, [name]  ? ? ?Brief Narrative:  ?Patient is a 56 y.o. male with PMH significant for DM2, HTN, CKD with questionable compliance to medications presented hospital with headache altered mental status.  In the ED patient was noted to be hypertensive with blood pressure up to 238/160, agitated, required restraints. ?CT head showed large amount of intraventricular hemorrhage involving lateral/third/fourth ventricle, ICH and left basal ganglia as well as chronic microvascular ischemic changes. He was started on Cleviprex drip and admitted to neuro ICU ?  ?He subsequently had CT head repeated on 11/30, 12/1 and 12/3, all demonstrated unchanged intraparenchymal hemorrhage and intraventricular hemorrhage ?12/3, MRI brain from also showed numerous small acute infarcts in both cerebral hemisphere with minimal improvement in the right superior cerebellum ?12/3, intubated ?12/13, tracheostomy ?12/15, wean from vent to trach collar ?12/16, PEG tube placement ?12/16, repeat MRI showed an increase in the size and number of acute infarcts in the bilateral hemispheres with increased involvement in the cerebellum bilaterally.   ?12/18, transferred from neuro service to [facility]  ?12/21, palliative care consulted.  Family wanted to continue aggressive care as full CODE STATUS. ?12/23 developed Cheyne-Stokes respiratory pattern with respiratory alkalosis.  Transferred to ICU for short-term mechanical ventilation and was found to have new tracheobronchitis now on cefepime ?12/25 back to progressive floor and to TRH ?  ?Patient's hospital course was complicated by significant neurological impairment, aspiration events, acute kidney injury, hyponatremia, urinary retention.    ? ?  ?Assessment and Plan: ? ?Active Problems: ?  Brain injury 2/2 acute intraparenchymal and intraventricular hemorrhage/Bilateral multifocal ischemic infarcts  with assoc muscle hypertonicity  ?  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  Malignant hypertension ?  Diabetes mellitus type 2 in obese [facility]) ?  Acute kidney injury on CKD 3b/acute azotemia with mild hypernatremia ?  hypernatremia ?  Anemia ?  Acute hypokalemia ?  Encounter for end of life care ?  Fever 2/2 Enterobacter UTI ?  Transaminitis ?  Protein-calorie malnutrition/dysphagia due to recent stroke/constipation ?  Constipation ?  Protein-calorie malnutrition, severe ?  Hypernatremia ?  Diarrhea ?  Pressure injury of skin ?  Acute conjunctivitis, bilateral ?  Fever in adult ?  Infection due to Enterobacter cloacae ?  UTI (urinary tract infection) with pyuria ?  Ileus with hypokalemia ?  Metabolic acidosis ?  Hypophosphatemia ?  Vomiting in adult ?  ?Brain injury 2/2 acute intraparenchymal and intraventricular hemorrhage/Bilateral multifocal ischemic infarcts with assoc muscle hypertonicity  ?Patient was on Zyprexa, baclofen, Symmetrel  for sequela of brain injury, currently on hold due to ileus.  Plan is discharge home with home health. ? ?Ileus with hypokalemia with possible hematemesis ?Continue Protonix.  Continue potassium supplements.    GI has seen the patient and recommended conservative treatment. ? 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 ? Continue trach care, Mucomyst, completed course of cefepime.  Blood cultures negative in 1 day.  Leukocytosis has trended down.  Temperature max of 99.6 Fahrenheit.  Follow urine cultures.  Had several antibiotics in the past. ? ?Malignant hypertension ?Patient is on Norvasc, Coreg, hydralazine and clonidine.  Due to ileus was put on IV labetalol and hydralazine.  Continue IV Lopressor to prevent beta-blocker withdrawal.  On topical clonidine  ? ?Diabetes mellitus type 2 in obese [facility]) ?Hemoglobin A1c of 6.3 on 12/31/2020.  Continue sliding scale insulin. Tradjenta, Glucotrol on  hold. ? ?Acute kidney injury on CKD 3b/acute azotemia with mild hypernatremia ?BUN elevated at 55 with normal creatinine of 1.2 and a GFR greater than 60 ? ?hypernatremia ?Again  trending up.  We will continue with D5 water. ? ?Anemia ?Latest hemoglobin of 8.9.  We will continue to monitor.   ? ?Acute otitis media, right with mastoid effusion-resolved as of 04/30/2021 ?Patient with recent bilateral conjunctivitis that persisted over 2 weeks despite pharmacological therapy. Maxillofacial CT revealed new right middle ear and mastoid opacification consistent with otitis media with mastoid effusion ? ?Acute conjunctivitis, bilateral ?Resolved ? ?Constipation ?CT with significant colonic stool retention.  Was on laxatives, currently on hold due to ileus. ? ?Protein-calorie malnutrition/dysphagia due to recent stroke/constipation ?Continue tube feeding.  ? ?Mild elevated LFT. ?Monitor. ? ? Enterobacter UTI ? 3/28 Urine cx positve for > 100,000 colonies of Enterobacter -received cefepime. ? ?Pneumonia due to Klebsiella pneumoniae (HCC)-resolved as of 04/01/2021 ? ?Pressure injury of skin-resolved as of 04/02/2021 ?Resolved-prior anus ulcer ? ?Acute urinary retention-resolved as of 03/30/2021 ?-Continue Urecholine ?-Foley catheter discontinued on 1/11 and patient voiding easily ? ?MSSA (methicillin susceptible Staphylococcus aureus) pneumonia (HCC)-resolved as of 04/02/2021 ? ?Pneumonia due to Serratia marcescens (HCC)-resolved as of 04/01/2021 ?Resolved ? ?End-of-life discussion ?Family aware of poor prognostic situation.  Currently full code.   ? ? DVT prophylaxis: Place and maintain sequential compression device Start: 05/07/21 2308 ? ? ?Code Status:   ?  Code Status: Full Code ? ?Disposition: Unknown at this time. ? ?Status is: Inpatient ? ?Remains inpatient appropriate because: Difficult disposition. ? ? Family Communication: None ? ?Consultants:  ?Neurology ?PCCM ?Palliative medicine ? ?Procedures:  ?EEG ?Cortrack ?PEG tube  placement ?Tracheostomy tube placement ? ?Antimicrobials:  ?None ? ?Subjective: ?Today, patient was seen and examined at bedside.  No interval complaints reported.  ? ?Objective: ?Vitals:  ? 05/10/21 0400 05/10/21 0705 05/10/21 0844 05/10/21 1025  ?BP: (!) 158/80  (!) 173/87   ?Pulse:  86 96 100  ?Resp:  18 18 (!) 22  ?Temp:   99 ?F (37.2 ?C)   ?TempSrc:   Oral   ?SpO2:  96% 100% 98%  ?Weight:      ?Height:      ? ? ?Intake/Output Summary (Last 24 hours) at 05/10/2021 1040 ?Last data filed at 05/10/2021 [id number] ?Gross per 24 hour  ?Intake 1513.22 ml  ?Output 950 ml  ?Net 563.22 ml  ? ? ?Filed Weights  ? 03/16/21 0500 04/13/21 1700 04/28/21 0500  ?Weight: 83.5 kg 108 kg 111.1 kg  ? ? ?Physical Examination: ? ?General: Unresponsive ?HENT:   No scleral pallor or icterus noted. Oral mucosa is moist.  Tracheostomy collar in place. ?Chest: Diminished breath sounds bilaterally.  ?CVS: S1 &S2 heard. No murmur.  Regular rate and rhythm. ?Abdomen: Soft, nontender, nondistended.  Bowel sounds are heard.   ?Extremities: No cyanosis, clubbing with nonpitting edema..  Peripheral pulses are palpable. ?Psych: Unresponsive. ?CNS: Unresponsive to tactile/verbal stimulation. ?Skin: Warm and dry.  No rashes noted. ? ?Data Reviewed:  ? ?CBC: ?Recent Labs  ?Lab 05/05/21 ?0206 05/06/21 ?0330 05/07/21 ?0143 05/08/21 ?[id number] 05/09/21 ?0216 05/10/21 ?[id number]  ?WBC 16.3* 16.1* 15.6* 22.6* 14.1* 10.4  ?NEUTROABS 13.1* 11.9* 11.1*  --   --   --   ?HGB 10.0* 9.2* 8.7* 9.1* 8.9* 8.0*  ?HCT 31.2* 28.5* 28.1* 29.3* 28.5* 26.4*  ?MCV 76.5* 77.9* 78.3* 77.9* 77.7* 80.5  ?PLT 187 205 227 243 240 221  ? ? ? ?Basic Metabolic Panel: ?Recent Labs  ?  Lab 05/04/21 ?0218 05/05/21 ?0206 05/05/21 ?[id number] 05/06/21 ?0330 05/07/21 ?0143 05/08/21 ?[id number] 05/09/21 ?0216 05/10/21 ?NP:[id number]  ?NA 146*   < >  --  148* 146* 143 145 150*  ?K 3.4*   < >  --  3.9 3.5 3.9 3.5 3.4*  ?CL 120*   < >  --  122* 122* 116* 117* 125*  ?CO2 17*   < >  --  18* 20* 19* 19* 18*  ?GLUCOSE 91   < >  --  165*  173* 131* 115* 124*  ?BUN 30*   < >  --  36* 33* 35* 29* 21*  ?CREATININE 1.36*   < >  --  1.56* 1.52* 1.47* 1.37* 1.20  ?CALCIUM 9.6   < >  --  9.6 9.3 9.3 9.3 9.2  ?MG  --   --   --  2.2  --  1.9  --  1.

## 2021-05-11 LAB — BASIC METABOLIC PANEL
Anion gap: 6 (ref 5–15)
BUN: 19 mg/dL (ref 6–20)
CO2: 20 mmol/L — ABNORMAL LOW (ref 22–32)
Calcium: 9 mg/dL (ref 8.9–10.3)
Chloride: 126 mmol/L — ABNORMAL HIGH (ref 98–111)
Creatinine, Ser: 1.19 mg/dL (ref 0.61–1.24)
GFR, Estimated: >60 mL/min (ref >60)
Glucose, Bld: 149 mg/dL — ABNORMAL HIGH (ref 70–99)
Potassium: 3.5 mmol/L (ref 3.5–5.1)
Sodium: 152 mmol/L — ABNORMAL HIGH (ref 135–145)

## 2021-05-11 LAB — URINE CULTURE: Culture: 20000 — AB

## 2021-05-11 LAB — GLUCOSE, CAPILLARY
Glucose-Capillary: 129 mg/dL — ABNORMAL HIGH (ref 70–99)
Glucose-Capillary: 133 mg/dL — ABNORMAL HIGH (ref 70–99)
Glucose-Capillary: 140 mg/dL — ABNORMAL HIGH (ref 70–99)
Glucose-Capillary: 143 mg/dL — ABNORMAL HIGH (ref 70–99)
Glucose-Capillary: 153 mg/dL — ABNORMAL HIGH (ref 70–99)
Glucose-Capillary: 155 mg/dL — ABNORMAL HIGH (ref 70–99)
Glucose-Capillary: 168 mg/dL — ABNORMAL HIGH (ref 70–99)

## 2021-05-11 LAB — CBC
HCT: 26.4 % — ABNORMAL LOW (ref 39.0–52.0)
Hemoglobin: 8 g/dL — ABNORMAL LOW (ref 13.0–17.0)
MCH: 24.4 pg — ABNORMAL LOW (ref 26.0–34.0)
MCHC: 30.3 g/dL (ref 30.0–36.0)
MCV: 80.5 fL (ref 80.0–100.0)
Platelets: 240 10*3/uL (ref 150–400)
RBC: 3.28 MIL/uL — ABNORMAL LOW (ref 4.22–5.81)
RDW: 17.6 % — ABNORMAL HIGH (ref 11.5–15.5)
WBC: 9.8 10*3/uL (ref 4.0–10.5)
nRBC: 0 % (ref 0.0–0.2)

## 2021-05-11 MED ORDER — OLANZAPINE 2.5 MG PO TABS
2.5000 mg | ORAL_TABLET | Freq: Every day | ORAL | Status: DC
Start: 2021-05-11 — End: 2021-05-11

## 2021-05-11 MED ORDER — OLANZAPINE 2.5 MG PO TABS: 2.5000 mg | ORAL_TABLET | Freq: Every day | ORAL | Status: DC

## 2021-05-11 MED ORDER — LOSARTAN POTASSIUM 25 MG PO TABS: 25.0000 mg | ORAL_TABLET | Freq: Every day | ORAL | Status: DC

## 2021-05-11 MED ORDER — CARVEDILOL 12.5 MG PO TABS: 12.5000 mg | ORAL_TABLET | Freq: Two times a day (BID) | ORAL | Status: DC

## 2021-05-11 MED ORDER — CARVEDILOL 12.5 MG PO TABS
12.5000 mg | ORAL_TABLET | Freq: Two times a day (BID) | ORAL | Status: DC
  Administered 2021-05-11 – 2021-05-22 (×22): 12.5 mg
  Filled 2021-05-11 (×22): qty 1

## 2021-05-11 MED ORDER — POTASSIUM CHLORIDE 20 MEQ PO PACK: 20.0000 meq | PACK | Freq: Two times a day (BID) | ORAL | Status: DC

## 2021-05-11 MED ORDER — POTASSIUM CHLORIDE 20 MEQ PO PACK
20.0000 meq | PACK | Freq: Two times a day (BID) | ORAL | Status: DC
  Administered 2021-05-11 – 2021-05-12 (×4): 20 meq
  Filled 2021-05-11 (×4): qty 1

## 2021-05-11 MED ORDER — AMLODIPINE BESYLATE 10 MG PO TABS
10.0000 mg | ORAL_TABLET | Freq: Every day | ORAL | Status: DC
Start: 2021-05-12 — End: 2021-05-13
  Administered 2021-05-12 – 2021-05-13 (×2): 10 mg
  Filled 2021-05-11 (×2): qty 1

## 2021-05-11 MED ORDER — AMLODIPINE BESYLATE 10 MG PO TABS: 10.0000 mg | ORAL_TABLET | Freq: Every day | ORAL | Status: DC

## 2021-05-11 NOTE — Progress Notes (Addendum)
?Progress Note ?  ?  ?Patient: Stephen Delgado [id number] DOB: 1965-06-29 DOA: 12/30/2020     122 ?DOS: the patient was seen and examined on 05/01/2021 ?  ?  ?  ?Brief hospital course: ?56 y.o. male with PMH significant for DM2, HTN, CKD with questionable compliance to medications ?Patient presented to the ED on 12/30/2020 with complaint of headache, altered mental status ?  ?In the ED, he was hypertensive to 238/160, agitated, required restraints ?CT head showed large amount of intraventricular hemorrhage involving lateral/third/fourth ventricle, ICH and left basal ganglia as well as chronic microvascular ischemic changes. ?He was started on Cleviprex drip and admitted to neuro ICU ?  ?He subsequently had CT head repeated on 11/30, 12/1 and 12/3, all demonstrated unchanged intraparenchymal hemorrhage and intraventricular hemorrhage ?12/3, MRI brain from also showed numerous small acute infarcts in both cerebral hemisphere with minimal improvement in the right superior cerebellum ?12/3, intubated ?12/13, tracheostomy ?12/15, wean from vent to trach collar ?12/16, PEG tube placement ?12/16, repeat MRI showed an increase in the size and number of acute infarcts in the bilateral hemispheres with increased involvement in the cerebellum bilaterally.   ?12/18, transferred from neuro service to [facility]  ?12/21, palliative care consulted.  Family wanted to continue aggressive care as full CODE STATUS. ?12/23 developed Cheyne-Stokes respiratory pattern with respiratory alkalosis.  Transferred to ICU for short-term mechanical ventilation and was found to have new tracheobronchitis now on cefepime ?12/25 back to progressive floor and to TRH ?  ?His hospital course has been complicated by significant neurological impairment, aspiration events, AKI, hypernatremia, urinary retention. ?1/9 trach changed to #6.0 Shiley cuffless--1/10 > 30 days since trach inserted. ?  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  Staff educating family regarding the care of this patient including suctioning, administration of medications and tube feedings ?  ?End-of-life discussion ?Given recurrent issues of emesis and ileus type symptoms with no clear etiology discussed with wife that this is a poor prognostic indicator.  She stated that she noticed he did better with a lower rate of tube feeding but I reminded her that this is not enough to provide adequate nutrition and sustain life.  I encouraged her and her family to have a discussion regarding this since her husband currently is a full code and if something happen we would have to resuscitate him.  I also reported that I was asking the gastroenterology service to evaluate patient since the emesis looked like it may have component contained old blood.  I did educate her that patient was already on twice daily Protonix which should be preventing any ulcers.  Given patient's poor clinical status and prognosis I told her that the gastroenterology team may be reluctant to proceed with any type of endoscopic evaluation.  I let her know that he was only running low-grade fevers with a white count had increased so I was performing another infectious work-up despite having just completed cefepime for UTI and pneumonia. ? ?A physical therapy consult is indicated based on the patient?s mobility assessment. ?  ?  ?Mobility Assessment (last 72 hours)   ?  ?  Mobility Assessment   ?  ?  [place] Name 03/29/21 2000 03/29/21 1800 03/29/21 1400 03/29/21 1200 03/29/21 0800  ?  Does patient have an order for bedrest or  is patient medically unstable Yes- Bedfast (Level 1) - Complete Yes- Bedfast (Level 1) - Complete Yes- Bedfast (Level 1) - Complete Yes- Bedfast (Level 1) - Complete Yes- Bedfast (Level 1) - Complete  ?  What is the highest level of mobility based on the progressive mobility assessment? Level 1 (Bedfast) - Unable to balance while sitting on edge of bed -- Level 1 (Bedfast) - Unable to balance while sitting on edge of bed Level 1 (Bedfast) - Unable to balance while sitting on edge of bed Level 1 (Bedfast) - Unable to balance while sitting on edge of bed  ?  Is the above level different from baseline mobility prior to current illness? No - Consider discontinuing PT/OT -- Yes - Recommend PT order Yes - Recommend PT order Yes - Recommend PT order  ?  ?  [place] Name 03/28/21 2000 03/28/21 0800   ?    ?    ?   ?  Does patient have an order for bedrest or is patient medically unstable Yes- Bedfast (Level 1) - Complete Yes- Bedfast (Level 1) - Complete        ?  What is the highest level of mobility based on the progressive mobility assessment? Level 1 (Bedfast) - Unable to balance while sitting on edge of bed Level 1 (Bedfast) - Unable to balance while sitting on edge of bed        ?  Is the above level different from baseline mobility prior to current illness? No - Consider discontinuing PT/OT No - Consider discontinuing PT/OT        ?  ?   ?  ?  ?   ?  ?  ?  ?Ileus type symptoms with recurrent nausea and vomiting with hypokalemia  ?Patient with significant ileus likely related to acute UTI with concomitant pneumonia ?Gastric tube placed to straight drain for greater than 24 hours ?Follow-up KUB on 3/31 reveals resolution of ileus ?Continue Protonix every 12 hours as well as Reglan ?Evaluated by GI for possible GI bleed with no symptoms detected and GI signed off.  Feeding resumed and as tolerated at 30 cc/h over the past 24 hours.  Will uptitrate very slowly ?Recommendation is to keep potassium >/= 4.0-/10 we will begin  scheduled potassium per tube 20 mEq twice daily and check electrolytes every 72 hours ?  ?Acute otitis media, right with mastoid effusion-resolved as of 04/30/2021 ?Patient with recent bilateral conjunctivitis that persisted over 2 weeks despite pharmacological therapy ?Maxillofacial CT revealed new right middle ear and mastoid opacification consistent with otitis media with mastoid effusion ?Will need follow-up CT to evaluate for resolution.  If not resolved consideration should be given to ENT evaluation to determine if a candidate for myringotomy tube --Right TM unremarkable and without signs of infection when examined.  ? ?Hypernatremia with metabolic acidosis/hypokalemia/mild acute kidney injury ?Likely related to significant dehydration ?D5W maintenance fluids increased to 100 cc/h on 4/5 and today 4/6 sodium down to 146.  Potassium 3.5.  CO2 up to 20 with hydration and utilization of per tube bicarb, albumin stable at 2.0, for still low at 1.6 so in addition to per tube phosphorus we will give one-time dose of K-Phos 30 mmol IV given on 4/6 ?Event recurrent emesis per tube potassium phosphate discontinued ?As of 4/7 phosphorus had normalized to 3.1 ? ?Microcytic anemia ?  As of 4/3 hgb down to 6.9 ?After 2 units PRBCs on 4/3 hemoglobin up to 10 ?Iron low at 32 with a low TIBC and elevated ferritin.  Discussed with attending at this time we will hold off on giving iron- patient did get iron replacement with the packed red blood cells ?As of 4/5 hemoglobin up to 9.2 after transfusion ?GI consulted for possible GI bleed symptoms with nursing staff reporting coffee-ground emesis.  GI aspirated fluids from PEG tube and found no evidence of GI bleeding.  They followed the patient for 2 days and signed off. ?Current hemoglobin stable at 8.0 ? ?Leukocytosis ?Resolved ?  ?Enterobacter UTI ?Resolved ?3/28 Urine cx positve for > 100,000 colonies of Enterobacter -changed to cefepime 3/28 due to positive sputum  (serratia)-continue for 7 days as of 4/3 has completed antibiotic ?  ?Acute respiratory failure w/tracheostomy dependence/recurrent Serratia pneumonia and STAPH ?Continue routine trach care ?Clarified w/ bedside [employer]

## 2021-05-11 NOTE — Progress Notes (Signed)
Nutrition Follow-up ? ?DOCUMENTATION CODES:  ?Severe malnutrition in context of chronic illness ? ?INTERVENTION:  ?Obtain weights weekly  ? ?Continue TF via PEG: ?-Osmolite 1.5 @ 41m/hr, increase per MD/NP to goal rate of 668mhr (recommend increasing by 1091mr Q12H until goal rate is reached) ?-30m12mosource TF BID ? ?At goal, TF regimen will provide 2240 kcals, 112 grams protein, 1097ml16me water ? ?NUTRITION DIAGNOSIS:  ?Severe Malnutrition related to chronic illness as evidenced by severe muscle depletion, severe fat depletion. -- ongoing ? ?GOAL:  ?Patient will meet greater than or equal to 90% of their needs -- addressing with TF, met when TF at goal ? ?MONITOR:  ?TF tolerance, Labs, I & O's, Skin ? ?REASON FOR ASSESSMENT:  ?Ventilator, Consult ?Enteral/tube feeding initiation and management ? ?ASSESSMENT:  ?55 ye34 old male who presented to the ED on 11/29 with AMS. PMH of HTN, T2DM, CKD stage III. 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 ? ?11/30 cortrak placed; tip gastric ?12/2 trickle TF ?12/3 pt intubated due to increased WOB, +emesis, TF held ?12/4 trickle TF restarted ?12/5 advancement orders placed ?12/9 TF adjusted ?12/13 s/p tracheostomy ?12/16 s/p EGD and PEG placement ?12/19 tx out of ICU to TRH  [facility]/23 developed Cheyne-Stokes respiratory pattern with respiratory alkalosis.Transferred to ICU for short-term mechanical ventilation and was found to have new tracheobronchitis now on cefepime ?12/25 tx back to TRH ?[place] Mel9 trach changed to #6 shiley cuffless  ?1/10 marks 30 days since trach placed ?2/17 trach changed   ?3/03 trach changed  ?3/04 SBO resolved ? ?Pt continues to be minimally responsive, tolerating ATC and continues to be NPO requiring TF via PEG. Pt to d/c home with home health once medically stable.  ? ?GI was consulted for possible GI bleed symptoms with nursing staff reporting coffee-ground emesis. GI followed the pt for 2  days and signed off after seeing no evidence of GI bleed. Per NP, pt has since been tolerating tube feeding at rate of 30ml/55mPlan is to slowly titrate pt back to goal rate of 60ml/h23m? ?Current TF orders: Osmolite 1.5 @ 60ml/hr61m30ml Pro70mce TF BID  ?  ?UOP: 700ml x24 31ms ?I/O: +19.9L since admit ? ?Admit wt 85.2 kg ?Current wt 111 kg (updated 3/28) ?  ?Medications: reglan, protonix, klor-con, SSI Q4H ?IVF: D5-NS @ 75ml/hr  ?80m: Na 152, hgb 8.0 ?CBGs: 97-168 x 24 hours ? ?Diet Order:   ?Diet Order   ? ?       ?  Diet NPO time specified  Diet effective midnight       ?  ? ?  ?  ? ?  ? ?EDUCATION NEEDS:  ?Not appropriate for education at this time ? ?Skin:  Skin Assessment: Reviewed RN Assessment ? ?Last BM:  4/8 ? ?Height:  ?Ht Readings from Last 1 Encounters:  ?01/23/21 6' (1.829 m)  ? ?Weight:  ?Wt Readings from Last 1 Encounters:  ?04/28/21 111.1 kg  ? ?BMI:  Body mass index is 33.23 kg/m?. ? ?Estimated Nutritional Needs:  ?Kcal:  2150-2350 ?Protein:  105-120 grams ?Fluid:  >2L ? ? ?Laterrian Hevener A., Theone StanleyLDN (she/her/hers) ?RD pager number and weekend/on-call pager number located in Amion. ? ?[place]

## 2021-05-12 LAB — GLUCOSE, CAPILLARY
Glucose-Capillary: 109 mg/dL — ABNORMAL HIGH (ref 70–99)
Glucose-Capillary: 127 mg/dL — ABNORMAL HIGH (ref 70–99)
Glucose-Capillary: 129 mg/dL — ABNORMAL HIGH (ref 70–99)
Glucose-Capillary: 142 mg/dL — ABNORMAL HIGH (ref 70–99)
Glucose-Capillary: 149 mg/dL — ABNORMAL HIGH (ref 70–99)
Glucose-Capillary: 175 mg/dL — ABNORMAL HIGH (ref 70–99)

## 2021-05-12 LAB — BASIC METABOLIC PANEL
Anion gap: 6 (ref 5–15)
BUN: 19 mg/dL (ref 6–20)
CO2: 17 mmol/L — ABNORMAL LOW (ref 22–32)
Calcium: 8.8 mg/dL — ABNORMAL LOW (ref 8.9–10.3)
Chloride: 129 mmol/L — ABNORMAL HIGH (ref 98–111)
Creatinine, Ser: 1.16 mg/dL (ref 0.61–1.24)
GFR, Estimated: >60 mL/min (ref >60)
Glucose, Bld: 163 mg/dL — ABNORMAL HIGH (ref 70–99)
Potassium: 4.2 mmol/L (ref 3.5–5.1)
Sodium: 152 mmol/L — ABNORMAL HIGH (ref 135–145)

## 2021-05-12 MED ORDER — LOSARTAN POTASSIUM 50 MG PO TABS
50.0000 mg | ORAL_TABLET | Freq: Every day | ORAL | Status: DC
  Administered 2021-05-12 – 2021-05-22 (×11): 50 mg
  Filled 2021-05-12 (×12): qty 1

## 2021-05-12 MED ORDER — FREE WATER
100.0000 mL | Freq: Three times a day (TID) | Status: DC
  Administered 2021-05-12 – 2021-05-16 (×14): 100 mL

## 2021-05-12 MED ORDER — ISOSORBIDE DINITRATE 10 MG PO TABS
10.0000 mg | ORAL_TABLET | Freq: Three times a day (TID) | ORAL | Status: DC
  Administered 2021-05-12 – 2021-05-13 (×4): 10 mg
  Filled 2021-05-12 (×6): qty 1

## 2021-05-12 NOTE — Progress Notes (Signed)
? ?NAME:  Stephen Delgado, MRN:  [id number], DOB:  07-12-1965, LOS: 133 ?ADMISSION DATE:  12/30/2020, CONSULTATION DATE:  12/19 ?REFERRING MD:  Erlinda Hong, CHIEF COMPLAINT:  Dyspnea  ? ?History of Present Illness:  ?56 yo male presented with headache, nausea and altered mental status.  CT head showed acute Lt ICH with IVH likely related to hypertensive emergency (BP 195/116). Had persistent hypertension and concern for airway protection, and PCCM asked to assist with ICU management.  Tracheostomy 12/13.  Moved out of ICU.  ? ?Pertinent  Medical History  ?HTN, DM type 2, CKD 3a, OSA ? ?Significant Hospital Events: ?Including procedures, antibiotic start and stop dates in addition to other pertinent events   ?11/29 Admit, start cleviprex ?12/01 PCCM consulted; changed from cleviprex to cardene due to elevated triglycerides ?12/03 intubated; episode of vomiting >> tube feeds held ?12/04 resume trickle tube feeds ?12/06 remains minimally responsive. Tolerating SBT.  ?12/12 no acute events overnight, T-max 102 point ?12/13 bedside trach planned midmorning  ?12/14 ATC. Lasix --> 5L UOP  ?12/15 cont on trach collar. Na to 154 from 145. Adding low rate d5  ?12/16 scheduled for PEG. Cont D5. Continues on trach collar ?Out of ICU 12/19 ?12/23 Cheyne-Stokes respirations noted and was transferred to ICU for short-term ventilation.  ?12/25 Returned to floor ?1/9 Trach changed to 6 cuffless  ?2/6 Trach follow up, no major changes, remains on ATC  ?2/27 trach f/u, on TC, seems to attend briefly, otherwise poor neuro exam ?04/06/2021 Trach FU, On ATC at 28%, no change in MS, neuro exam ? ?Imaging/Studies: ?CT head 11/29 >> large amount of IVH involving lateral/3rd/4th ventricles, ICH in Lt BG, chronic microvascular ischemic changes ?Echo 11/30 >> EF 50 to 55%, mod LVH, grade 1 DD, ascending aorta 37 mm ?EEG 12/01 >> continuous generalized slowing ?MRI brain 12/03 >> numerous small acute infarcts in b/l cerebral hemispheres, Lt caudate  hemorrhage, IVH, scattered sulcal SAH, mild communicating hydrocephalus ?CT head 12/23 Expected evolution of strokes, no shift or herniation ? ? ? ?Interim History / Subjective:  ? ?No clinical change reported ? ?Objective   ?Blood pressure (!) 159/109, pulse 92, temperature 99.1 ?F (37.3 ?C), temperature source Oral, resp. rate 16, height 6' (1.829 m), weight 111.1 kg, SpO2 100 %. ?   ?FiO2 (%):  [21 %-28 %] 21 %  ? ?Intake/Output Summary (Last 24 hours) at 05/12/2021 1630 ?Last data filed at 05/12/2021 [id number] ?Gross per 24 hour  ?Intake --  ?Output 700 ml  ?Net -700 ml  ? ?Filed Weights  ? 03/16/21 0500 04/13/21 1700 04/28/21 0500  ?Weight: 83.5 kg 108 kg 111.1 kg  ? ?Physical exam  ?General: Ill-appearing man, laying in bed in no distress ?HEENT: Trach site looks good, no secretions ?Neuro: Does not track or follow commands. ?CV: Distant, regular ?PULM: Decreased to both bases ?GI: Nondistended ? ? ?Resolved Hospital Problem list   ? ? ?Assessment & Plan:  ? 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 ?Continue nebulized bronchodilators ?Would not decannulate as long as he does not have the airway protection to manage  his own oral secretions ?We will continue to follow weekly ? ? ?Baltazar Apo, MD, PhD ?05/12/2021, 4:32 PM ?[facility] ?[phone] or if no answer before 7:00PM call [phone] ?For any issues after 7:00PM please call eLink [phone] ? ? ? ? ? ? ?

## 2021-05-12 NOTE — Progress Notes (Signed)
?Progress Note ?  ?  ?Patient: Stephen Delgado [id number] DOB: July 18, 1965 DOA: 12/30/2020     122 ?DOS: the patient was seen and examined on 05/01/2021 ?  ?  ?  ?Brief hospital course: ?56 y.o. male with PMH significant for DM2, HTN, CKD with questionable compliance to medications ?Patient presented to the ED on 12/30/2020 with complaint of headache, altered mental status ?  ?In the ED, he was hypertensive to 238/160, agitated, required restraints ?CT head showed large amount of intraventricular hemorrhage involving lateral/third/fourth ventricle, ICH and left basal ganglia as well as chronic microvascular ischemic changes. ?He was started on Cleviprex drip and admitted to neuro ICU ?  ?He subsequently had CT head repeated on 11/30, 12/1 and 12/3, all demonstrated unchanged intraparenchymal hemorrhage and intraventricular hemorrhage ?12/3, MRI brain from also showed numerous small acute infarcts in both cerebral hemisphere with minimal improvement in the right superior cerebellum ?12/3, intubated ?12/13, tracheostomy ?12/15, wean from vent to trach collar ?12/16, PEG tube placement ?12/16, repeat MRI showed an increase in the size and number of acute infarcts in the bilateral hemispheres with increased involvement in the cerebellum bilaterally.   ?12/18, transferred from neuro service to [facility]  ?12/21, palliative care consulted.  Family wanted to continue aggressive care as full CODE STATUS. ?12/23 developed Cheyne-Stokes respiratory pattern with respiratory alkalosis.  Transferred to ICU for short-term mechanical ventilation and was found to have new tracheobronchitis now on cefepime ?12/25 back to progressive floor and to TRH ?  ?His hospital course has been complicated by significant neurological impairment, aspiration events, AKI, hypernatremia, urinary retention. ?1/9 trach changed to #6.0 Shiley cuffless--1/10 > 30 days since trach inserted. ?  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  ?-Plan is to discharge home with home health when medically stable.  Staff educating family regarding the care of this patient including suctioning, administration of medications and tube feedings ?  ?End-of-life discussion ?Given recurrent issues of emesis and ileus type symptoms with no clear etiology discussed with wife that this is a poor prognostic indicator.  She stated that she noticed he did better with a lower rate of tube feeding but I reminded her that this is not enough to provide adequate nutrition and sustain life.  I encouraged her and her family to have a discussion regarding this since her husband currently is a full code and if something happen we would have to resuscitate him.  I also reported that I was asking the gastroenterology service to evaluate patient since the emesis looked like it may have component contained old blood.  I did educate her that patient was already on twice daily Protonix which should be preventing any ulcers.  Given patient's poor clinical status and prognosis I told her that the gastroenterology team may be reluctant to proceed with any type of endoscopic evaluation.  I let her know that he was only running low-grade fevers with a white count had increased so I was performing another infectious work-up despite having just completed cefepime for UTI and pneumonia. ? ?A physical therapy consult is indicated based on the patient?s mobility assessment. ?  ?  ?Mobility Assessment (last 72 hours)   ?  ?  Mobility Assessment   ?  ?  [place] Name 03/29/21 2000 03/29/21 1800 03/29/21 1400 03/29/21 1200 03/29/21 0800  ?  Does patient have an order for bedrest or is patient  medically unstable Yes- Bedfast (Level 1) - Complete Yes- Bedfast (Level 1) - Complete Yes- Bedfast (Level 1) - Complete Yes- Bedfast (Level 1) - Complete Yes- Bedfast (Level 1) - Complete  ?  What is the highest level of mobility based on the progressive mobility assessment? Level 1 (Bedfast) - Unable to balance while sitting on edge of bed -- Level 1 (Bedfast) - Unable to balance while sitting on edge of bed Level 1 (Bedfast) - Unable to balance while sitting on edge of bed Level 1 (Bedfast) - Unable to balance while sitting on edge of bed  ?  Is the above level different from baseline mobility prior to current illness? No - Consider discontinuing PT/OT -- Yes - Recommend PT order Yes - Recommend PT order Yes - Recommend PT order  ?  ?  [place] Name 03/28/21 2000 03/28/21 0800   ?    ?    ?   ?  Does patient have an order for bedrest or is patient medically unstable Yes- Bedfast (Level 1) - Complete Yes- Bedfast (Level 1) - Complete        ?  What is the highest level of mobility based on the progressive mobility assessment? Level 1 (Bedfast) - Unable to balance while sitting on edge of bed Level 1 (Bedfast) - Unable to balance while sitting on edge of bed        ?  Is the above level different from baseline mobility prior to current illness? No - Consider discontinuing PT/OT No - Consider discontinuing PT/OT        ?  ?   ?  ?  ?   ?  ? Hypernatremia with metabolic acidosis/hypokalemia/mild acute kidney injury ?Likely related to significant dehydration ?D5W maintenance fluids increased to 100 cc/h on 4/5 and today 4/6 sodium down to 146.  Potassium 3.5.  CO2 up to 20 with hydration and utilization of per tube bicarb, albumin stable at 2.0, for still low at 1.6 so in addition to per tube phosphorus we will give one-time dose of K-Phos 30 mmol IV given on 4/6 ?Event recurrent emesis per tube potassium phosphate discontinued ?As of 4/7 phosphorus had normalized to 3.1 ? ?Microcytic anemia ?As of 4/3 hgb down to  6.9 ?After 2 units PRBCs on 4/3 hemoglobin up to 10 ?Iron low at 32 with a low TIBC and elevated ferritin.  Discussed with attending at this time we will hold off on giving iron- patient did get iron replacement with the packed red blood cells ?As of 4/5 hemoglobin up to 9.2 after transfusion ?Patient had coffee-ground emesis overnight in context of apparent intolerance to tube feedings once again.  Abdominal x-ray unrevealing.  Hemoccult pending.  Hemoglobin stable.  Call placed to [facility] for consultation ? ?Recent Enterobacter UTI ?Resolved ?3/28 Urine cx positve for > 100,000 colonies of Enterobacter -changed to cefepime 3/28 due to positive sputum (serratia)-continue for 7 days as of 4/3 has completed antibiotic ?Recent low-grade fevers without leukocytosis.  As precaution repeat UA and culture obtained.  Culture revealed 20,000 colonies of Enterobacter only sensitive to linezolid.  Discussed with attending and since no leukocytosis and only isolated elevated temp spikes no indication to treat ?  ?Acute respiratory failure w/tracheostomy dependence/recurrent Serratia pneumonia and STAPH ?Continue routine trach care ?Clarified w/ bedside nurse that he requires suctioning  3-4 x per shift-the flow record does not reflect this but as noted I confirmed with staff suctioning needs  ? ?Persistent hypernatremia, dehydration, recent metabolic acidosis secondary to dehydration ?Begin free water 100 cc three 8-hour ?Change normal saline to lactated Ringer's at 100 cc/h at recommendation of pharmacist ?Follow electrolytes ? ?Malignant hypertension ?Continue topical clonidine, max dose Norvasc, carvedilol ?4/11 increase Cozaar to 50 mg, add isosorbide every 6 hours blood pressure remains elevated ? ?Protein-calorie malnutrition/dysphagia due to recent stroke/constipation ?Nutrition Problem: Inadequate oral intake ?Etiology: lethargy/confusion, dysphagia ?Signs/Symptoms: NPO status ?Interventions: Tube feeding via PEG  Osmolite at 30 cc/h with slow advance 10 cc/h every 12 hours due to recent issues with recurrent emesis.  Goal rate 60 cc/h ?Body mass index is 23.33 kg/m?.  ? ?Diabetes mellitus type 2 in obese [facility]) ?A1c 6.3 in

## 2021-05-12 NOTE — Plan of Care (Signed)
  Problem: Clinical Measurements: Goal: Will remain free from infection Outcome: Progressing Goal: Respiratory complications will improve Outcome: Progressing Goal: Cardiovascular complication will be avoided Outcome: Progressing   

## 2021-05-13 ENCOUNTER — Inpatient Hospital Stay (HOSPITAL_COMMUNITY): Payer: 59

## 2021-05-13 ENCOUNTER — Telehealth: Payer: Managed Care, Other (non HMO) | Admitting: Internal Medicine

## 2021-05-13 LAB — CBC WITH DIFFERENTIAL/PLATELET
Abs Immature Granulocytes: 0.13 10*3/uL — ABNORMAL HIGH (ref 0.00–0.07)
Basophils Absolute: 0 10*3/uL (ref 0.0–0.1)
Basophils Relative: 0 %
Eosinophils Absolute: 0.2 10*3/uL (ref 0.0–0.5)
Eosinophils Relative: 1 %
HCT: 24.9 % — ABNORMAL LOW (ref 39.0–52.0)
Hemoglobin: 7.7 g/dL — ABNORMAL LOW (ref 13.0–17.0)
Immature Granulocytes: 1 %
Lymphocytes Relative: 14 %
Lymphs Abs: 1.7 10*3/uL (ref 0.7–4.0)
MCH: 24.7 pg — ABNORMAL LOW (ref 26.0–34.0)
MCHC: 30.9 g/dL (ref 30.0–36.0)
MCV: 79.8 fL — ABNORMAL LOW (ref 80.0–100.0)
Monocytes Absolute: 1.2 10*3/uL — ABNORMAL HIGH (ref 0.1–1.0)
Monocytes Relative: 9 %
Neutro Abs: 9.2 10*3/uL — ABNORMAL HIGH (ref 1.7–7.7)
Neutrophils Relative %: 75 %
Platelets: 219 10*3/uL (ref 150–400)
RBC: 3.12 MIL/uL — ABNORMAL LOW (ref 4.22–5.81)
RDW: 18.2 % — ABNORMAL HIGH (ref 11.5–15.5)
WBC: 12.5 10*3/uL — ABNORMAL HIGH (ref 4.0–10.5)
nRBC: 0 % (ref 0.0–0.2)

## 2021-05-13 LAB — BASIC METABOLIC PANEL
Anion gap: 5 (ref 5–15)
BUN: 25 mg/dL — ABNORMAL HIGH (ref 6–20)
CO2: 18 mmol/L — ABNORMAL LOW (ref 22–32)
Calcium: 9.1 mg/dL (ref 8.9–10.3)
Chloride: 129 mmol/L — ABNORMAL HIGH (ref 98–111)
Creatinine, Ser: 1.16 mg/dL (ref 0.61–1.24)
GFR, Estimated: >60 mL/min (ref >60)
Glucose, Bld: 143 mg/dL — ABNORMAL HIGH (ref 70–99)
Potassium: 5.1 mmol/L (ref 3.5–5.1)
Sodium: 152 mmol/L — ABNORMAL HIGH (ref 135–145)

## 2021-05-13 LAB — CULTURE, BLOOD (ROUTINE X 2)
Culture: NO GROWTH
Culture: NO GROWTH
Special Requests: ADEQUATE
Special Requests: ADEQUATE

## 2021-05-13 LAB — GLUCOSE, CAPILLARY
Glucose-Capillary: 120 mg/dL — ABNORMAL HIGH (ref 70–99)
Glucose-Capillary: 122 mg/dL — ABNORMAL HIGH (ref 70–99)
Glucose-Capillary: 126 mg/dL — ABNORMAL HIGH (ref 70–99)
Glucose-Capillary: 137 mg/dL — ABNORMAL HIGH (ref 70–99)
Glucose-Capillary: 157 mg/dL — ABNORMAL HIGH (ref 70–99)
Glucose-Capillary: 166 mg/dL — ABNORMAL HIGH (ref 70–99)

## 2021-05-13 MED ORDER — AMLODIPINE BESYLATE 10 MG PO TABS
10.0000 mg | ORAL_TABLET | Freq: Every day | ORAL | Status: DC
  Administered 2021-05-14 – 2021-05-22 (×9): 10 mg
  Filled 2021-05-13 (×11): qty 1

## 2021-05-13 MED ORDER — METHOCARBAMOL 1000 MG/10ML IJ SOLN
500.0000 mg | Freq: Once | INTRAVENOUS | Status: DC
  Filled 2021-05-13: qty 5

## 2021-05-13 MED ORDER — LORAZEPAM 2 MG/ML IJ SOLN
1.0000 mg | Freq: Once | INTRAMUSCULAR | Status: AC
  Administered 2021-05-13: 1 mg via INTRAVENOUS
  Filled 2021-05-13: qty 1

## 2021-05-13 MED ORDER — ISOSORBIDE DINITRATE 20 MG PO TABS
20.0000 mg | ORAL_TABLET | Freq: Three times a day (TID) | ORAL | Status: DC
  Administered 2021-05-13 – 2021-05-22 (×27): 20 mg
  Filled 2021-05-13 (×32): qty 1

## 2021-05-13 NOTE — Progress Notes (Addendum)
Patient seen and examined at request of nursing staff ? ? seen earlier today by my colleague Ms. Lissa Merlin nursing reports that he has been a little bit more contracted--in his upper extremities as well as having some favoring of his head turning to the left which is unusual (previously he was turning more to the right) ?He had some episodes of blinking while I was in the room which is less than normal for him ?When I examined him he has some cogwheeling rigidity and I do not think he is decerebrate but rather he is developing contractures and flexion contractures in his arms ?he seems to reacts to menance-as I do not know his baseline it is difficult for me to make a comparison but I do not think he is having seizures ? ?I do not think he has an overt seizure-however if he continues to exhibit behaviors that are out of his norm he has Ativan ordered ?I will get routine CT to rule out any extension of his stroke as I think further extension of stroke is unlikely and my night partners will reassess him later tonight if there is a further concern ? ? ?Discussed at the bedside with charge nurse as well as nursing staff ? ? ?An additional 30 minutes was spent evaluating the patient reviewing the database and coordinating care ? ?Verneita Griffes, MD ?[facility] ?6:28 PM  ? ? ? ?

## 2021-05-13 NOTE — Progress Notes (Signed)
Suctioned patient prior to him leaving for CT scan. RN will attach patient back to ATC upon arrival back to room. RT placed on 35% [facility] ATC for transport. ?

## 2021-05-13 NOTE — Plan of Care (Signed)
?  Problem: Clinical Measurements: ?Goal: Will remain free from infection ?Outcome: Progressing ?Goal: Respiratory complications will improve ?Outcome: Progressing ?  ?Problem: Nutrition: ?Goal: Adequate nutrition will be maintained ?Outcome: Progressing ?  ?Problem: Elimination: ?Goal: Will not experience complications related to bowel motility ?Outcome: Progressing ?Goal: Will not experience complications related to urinary retention ?Outcome: Progressing ?  ?Problem: Safety: ?Goal: Ability to remain free from injury will improve ?Outcome: Progressing ?  ?Problem: Skin Integrity: ?Goal: Risk for impaired skin integrity will decrease ?Outcome: Progressing ?  ?

## 2021-05-13 NOTE — Progress Notes (Signed)
EEG complete - results pending 

## 2021-05-13 NOTE — TOC Progression Note (Signed)
Transition of Care (TOC) - Progression Note  ? ? ?Patient Details  ?Name: Stephen Delgado ?MRN: [id number] ?Date of Birth: 01-01-1966 ? ?Transition of Care (TOC) CM/SW Contact  ?[name], RN ?Phone Number: ?05/13/2021, 2:24 PM ? ?Clinical Narrative:    ?CM called and spoke with Shon Hough, CM with [facility] at [phone] and the office is aware that the patient is currently hospitalized and not medically stable for discharge to home at this time.  I informed the clinic that CM will reach back out to the clinic to reschedule a post-hospitalization appointment once the patient is stable to discharge home with private duty nursing through [facility] at a later determined date.  Junious Silk, NP is aware and she confirmed that the patient is not medically stable to discharge home at this time. ? ?CM and MSW with DTP Team will continue to follow the patient for discharge needs. ? ? ?Expected Discharge Plan: Home w Home Health Services ?Barriers to Discharge: Continued Medical Work up, No SNF bed ? ?Expected Discharge Plan and Services ?Expected Discharge Plan: Home w Home Health Services ?In-house Referral: Clinical Social Work, Hospice / Palliative Care, PCP / Health Connect ?Discharge Planning Services: CM Consult ?Post Acute Care Choice: Home Health ?Living arrangements for the past 2 months: Single Family Home ?                ?DME Arranged: Hospital bed, Trach supplies, Tube feeding, Oxygen ?DME Agency: [employer] ?Date DME Agency Contacted: 03/18/21 ?Time DME Agency Contacted: 1000 ?Representative spoke with at DME Agency: Jenness Corner, CM with [employer] ?  ?  ?  ?  ?  ? ? ?Social Determinants of Health (SDOH) Interventions ?  ? ?Readmission Risk Interventions ? ?  02/20/2021  ?  9:31 AM  ?Readmission Risk Prevention Plan  ?Transportation Screening Complete  ?Medication Review Oceanographer) Complete  ?PCP or Specialist appointment within 3-5 days of discharge Not Complete   ?PCP/Specialist Appt Not Complete comments Patient will need LTC placement at SNF facility.  ?HRI or Home Care Consult Complete  ?SW Recovery Care/Counseling Consult Complete  ?Palliative Care Screening Complete  ?Skilled Nursing Facility Complete  ? ? ?

## 2021-05-13 NOTE — Progress Notes (Signed)
?Progress Note ?  ?  ?Patient: Stephen Delgado [id number] DOB: 1965/08/19 DOA: 12/30/2020     122 ?DOS: the patient was seen and examined on 05/01/2021 ?  ?  ?  ?Brief hospital course: ?56 y.o. male with PMH significant for DM2, HTN, CKD with questionable compliance to medications ?Patient presented to the ED on 12/30/2020 with complaint of headache, altered mental status ?  ?In the ED, he was hypertensive to 238/160, agitated, required restraints ?CT head showed large amount of intraventricular hemorrhage involving lateral/third/fourth ventricle, ICH and left basal ganglia as well as chronic microvascular ischemic changes. ?He was started on Cleviprex drip and admitted to neuro ICU ?  ?He subsequently had CT head repeated on 11/30, 12/1 and 12/3, all demonstrated unchanged intraparenchymal hemorrhage and intraventricular hemorrhage ?12/3, MRI brain from also showed numerous small acute infarcts in both cerebral hemisphere with minimal improvement in the right superior cerebellum ?12/3, intubated ?12/13, tracheostomy ?12/15, wean from vent to trach collar ?12/16, PEG tube placement ?12/16, repeat MRI showed an increase in the size and number of acute infarcts in the bilateral hemispheres with increased involvement in the cerebellum bilaterally.   ?12/18, transferred from neuro service to [facility]  ?12/21, palliative care consulted.  Family wanted to continue aggressive care as full CODE STATUS. ?12/23 developed Cheyne-Stokes respiratory pattern with respiratory alkalosis.  Transferred to ICU for short-term mechanical ventilation and was found to have new tracheobronchitis now on cefepime ?12/25 back to progressive floor and to TRH ?  ?His hospital course has been complicated by significant neurological impairment, aspiration events, AKI, hypernatremia, urinary retention. ?1/9 trach changed to #6.0 Shiley cuffless--1/10 > 30 days since trach inserted. ?  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  ?-Plan is to discharge home with home health when medically stable.   ?-Staff educating family regarding the care of this patient including suctioning, administration of medications and tube feedings ?  ?End-of-life discussion ?Given recurrent issues of emesis and ileus type symptoms with no clear etiology discussed with wife that this is a poor prognostic indicator.  She stated that she noticed he did better with a lower rate of tube feeding but I reminded her that this is not enough to provide adequate nutrition and sustain life.  I encouraged her and her family to have a discussion regarding this since her husband currently is a full code and if something happen we would have to resuscitate him.  I also reported that I was asking the gastroenterology service to evaluate patient since the emesis looked like it may have component contained old blood.  I did educate her that patient was already on twice daily Protonix which should be preventing any ulcers.  Given patient's poor clinical status and prognosis I told her that the gastroenterology team may be reluctant to proceed with any type of endoscopic evaluation.  I let her know that he was only running low-grade fevers with a white count had increased so I was performing another infectious work-up despite having just completed cefepime for UTI and pneumonia. ? ?A physical therapy consult is indicated based on the patient?s mobility assessment. ?  ?  ?Mobility Assessment (last 72 hours)   ?  ?  Mobility Assessment   ?  ?  [place] Name 03/29/21 2000 03/29/21 1800 03/29/21 1400 03/29/21 1200 03/29/21 0800  ?  Does patient have an order for bedrest or is patient  medically unstable Yes- Bedfast (Level 1) - Complete Yes- Bedfast (Level 1) - Complete Yes- Bedfast (Level 1) - Complete Yes- Bedfast (Level 1) - Complete Yes- Bedfast (Level 1) - Complete  ?  What is the highest level of mobility based on the progressive mobility assessment? Level 1 (Bedfast) - Unable to balance while sitting on edge of bed -- Level 1 (Bedfast) - Unable to balance while sitting on edge of bed Level 1 (Bedfast) - Unable to balance while sitting on edge of bed Level 1 (Bedfast) - Unable to balance while sitting on edge of bed  ?  Is the above level different from baseline mobility prior to current illness? No - Consider discontinuing PT/OT -- Yes - Recommend PT order Yes - Recommend PT order Yes - Recommend PT order  ?  ?  [place] Name 03/28/21 2000 03/28/21 0800   ?    ?    ?   ?  Does patient have an order for bedrest or is patient medically unstable Yes- Bedfast (Level 1) - Complete Yes- Bedfast (Level 1) - Complete        ?  What is the highest level of mobility based on the progressive mobility assessment? Level 1 (Bedfast) - Unable to balance while sitting on edge of bed Level 1 (Bedfast) - Unable to balance while sitting on edge of bed        ?  Is the above level different from baseline mobility prior to current illness? No - Consider discontinuing PT/OT No - Consider discontinuing PT/OT        ?  ?   ?  ?  ?   ?  ?Hypernatremia with metabolic acidosis/hypokalemia/mild acute kidney injury ?Likely related to significant dehydration ?D5W maintenance fluids increased to 100 cc/h on 4/5 and today 4/6 sodium down to 146.  Potassium 3.5.  CO2 up to 20 with hydration and utilization of per tube bicarb, albumin stable at 2.0, for still low at 1.6 so in addition to per tube phosphorus we will give one-time dose of K-Phos 30 mmol IV given on 4/6-potassium has normalized so maintenance fluids containing potassium discontinued and supplemental potassium discontinued as of 4/12 ?Event recurrent emesis per  tube potassium phosphate discontinued ?As of 4/7 phosphorus had normalized to 3.1 ? ?Microcytic anemia ?As of 4/3 hgb down to 6.9 ?After 2 units PRBCs on 4/3 hemoglobin up to 10 ?Iron low at 32 with a low TIBC and elevated ferritin.  Discussed with attending at this time we will hold off on giving iron- patient did get iron replacement with the packed red blood cells ?As of 4/5 hemoglobin up to 9.2 after transfusion but as of 4/12 hemoglobin back down to 7.7.  Continue to follow.  Hemodynamically stable. ?Patient had coffee-ground emesis overnight in context of apparent intolerance to tube feedings once again.  Abdominal x-ray unrevealing.  Hemoccult pending.  Hemoglobin stable.  Call placed to [facility] for consultation ? ?Recent Enterobacter UTI ?Resolved ?3/28 Urine cx positve for > 100,000 colonies of Enterobacter -changed to cefepime 3/28 due to positive sputum (serratia)-continue for 7 days as of 4/3 has completed antibiotic ?Recent low-grade fevers without leukocytosis.  As precaution repeat UA and culture obtained.  Culture revealed 20,000 colonies of Enterobacter only sensitive to linezolid.  Discussed with attending and since no leukocytosis and only isolated  elevated temp spikes no indication to treat-on 4/12 minimal leukocytosis at 12,500 with normal differential and slight elevation in absolute neutrophils.  We will continue to follow.  tm hours 99.6 ?  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 ?Persistent hypernatremia, dehydration, recent metabolic acidosis secondary to dehydration ?Begin free water 100 cc Q 8 HRS-152, chloride 129 ?Continue lactated Ringer's at 100 cc/h at recommendation of pharmacist-serum CO2 slightly trending up ?Follow  electrolytes ? ?Malignant hypertension ?Continue topical clonidine, max dose Norvasc, carvedilol ?Continue Cozaar to 50 mg change dosing to 8 AM from 10 AM, continue isosorbide every 6 hours blood pressure remains elevate

## 2021-05-13 NOTE — Progress Notes (Signed)
While performing AM assessment patient began to cough, looked for yankauer to suction patient. Found it removed from suction tubing and suction tubing stuck/suctioned to patient's R forearm. Turned suction off to remove tubing and sound small serous blister. Cleansed it and placed foam over it.  ?

## 2021-05-14 ENCOUNTER — Inpatient Hospital Stay (HOSPITAL_COMMUNITY): Payer: 59

## 2021-05-14 LAB — BASIC METABOLIC PANEL
Anion gap: 6 (ref 5–15)
BUN: 29 mg/dL — ABNORMAL HIGH (ref 6–20)
CO2: 19 mmol/L — ABNORMAL LOW (ref 22–32)
Calcium: 9 mg/dL (ref 8.9–10.3)
Chloride: 128 mmol/L — ABNORMAL HIGH (ref 98–111)
Creatinine, Ser: 1.08 mg/dL (ref 0.61–1.24)
GFR, Estimated: >60 mL/min (ref >60)
Glucose, Bld: 150 mg/dL — ABNORMAL HIGH (ref 70–99)
Potassium: 3.9 mmol/L (ref 3.5–5.1)
Sodium: 153 mmol/L — ABNORMAL HIGH (ref 135–145)

## 2021-05-14 LAB — CBC WITH DIFFERENTIAL/PLATELET
Abs Immature Granulocytes: 0.08 10*3/uL — ABNORMAL HIGH (ref 0.00–0.07)
Basophils Absolute: 0 10*3/uL (ref 0.0–0.1)
Basophils Relative: 0 %
Eosinophils Absolute: 0.3 10*3/uL (ref 0.0–0.5)
Eosinophils Relative: 2 %
HCT: 25.1 % — ABNORMAL LOW (ref 39.0–52.0)
Hemoglobin: 7.4 g/dL — ABNORMAL LOW (ref 13.0–17.0)
Immature Granulocytes: 1 %
Lymphocytes Relative: 13 %
Lymphs Abs: 1.5 10*3/uL (ref 0.7–4.0)
MCH: 24.2 pg — ABNORMAL LOW (ref 26.0–34.0)
MCHC: 29.5 g/dL — ABNORMAL LOW (ref 30.0–36.0)
MCV: 82 fL (ref 80.0–100.0)
Monocytes Absolute: 1 10*3/uL (ref 0.1–1.0)
Monocytes Relative: 9 %
Neutro Abs: 8.6 10*3/uL — ABNORMAL HIGH (ref 1.7–7.7)
Neutrophils Relative %: 75 %
Platelets: 274 10*3/uL (ref 150–400)
RBC: 3.06 MIL/uL — ABNORMAL LOW (ref 4.22–5.81)
RDW: 18.5 % — ABNORMAL HIGH (ref 11.5–15.5)
WBC: 11.4 10*3/uL — ABNORMAL HIGH (ref 4.0–10.5)
nRBC: 0 % (ref 0.0–0.2)

## 2021-05-14 LAB — GLUCOSE, CAPILLARY
Glucose-Capillary: 140 mg/dL — ABNORMAL HIGH (ref 70–99)
Glucose-Capillary: 161 mg/dL — ABNORMAL HIGH (ref 70–99)
Glucose-Capillary: 162 mg/dL — ABNORMAL HIGH (ref 70–99)
Glucose-Capillary: 117 mg/dL — ABNORMAL HIGH (ref 70–99)
Glucose-Capillary: 147 mg/dL — ABNORMAL HIGH (ref 70–99)
Glucose-Capillary: 157 mg/dL — ABNORMAL HIGH (ref 70–99)

## 2021-05-14 MED ORDER — ACETAMINOPHEN 325 MG PO TABS
650.0000 mg | ORAL_TABLET | ORAL | Status: DC
Start: 2021-05-14 — End: 2021-05-15
  Administered 2021-05-14 – 2021-05-15 (×8): 650 mg via ORAL
  Filled 2021-05-14 (×9): qty 2

## 2021-05-14 NOTE — Significant Event (Addendum)
CT scan has resulted: does show possible new infarct. ? ?Will go ahead and let Neurology know... Spoke with Dr. Amada Jupiter. ? ?Though I suspect pt not going to be a candidate for any sort of acute intervention (TPA or IR thrombectomy) especially given HPI. ?

## 2021-05-14 NOTE — Progress Notes (Signed)
Subjective: ?I was called regarding head CT with some change. ? ?Exam: ?Vitals:  ? 05/14/21 0404 05/14/21 0459  ?BP:  (!) 164/78  ?Pulse:  91  ?Resp:    ?Temp:  99 ?F (37.2 ?C)  ?SpO2: 97%   ? ?Gen: In bed, NAD ? ? ?Neuro: ?MS: does not open eyes or follow commands ?EL:FYBOF, eyes are downward with sometines left and sometimes rightward gaze ?Motor: left side with extensor posturing, right side with flexion posturing.  ?Sensory:postures as above.  ? ?Pertinent Labs: ?Cr 1.16 ?Na 152 ? ?Impression: 56 year old male who originally presented on 11/30 with ICH and has had multiple episodes of concern, previously on LTM with no seizure seen. I suspect it was posturing rather than seizure that was seen earlier. If recurrent events continue, could consider LTM EEG but would not pursue that at this time.As to the CT finding, this could represent artifact given his movement, but if it is real, it is in the anterior watershed region and given his extensive watershed infarcts, would expect it to correspond to evolution of his watershed ischemia rather than some other acute process. In any case, I d not think it would change management at the current time and therefore do not feel strongly about an MRI.  ? ?His prognosis continues to be poor.  ? ?Recommendations: ?1)If he continues to have spells of concern, please reach out and could consider LTM EEG ?2) Neurology will be available on an as needed basis.  ? ?Ritta Slot, MD ?[facility] ?[phone] ? ?If 7pm- 7am, please page neurology on call as listed in AMION. ? ?

## 2021-05-14 NOTE — TOC Progression Note (Addendum)
Transition of Care (TOC) - Progression Note  ? ? ?Patient Details  ?Name: Stephen Delgado ?MRN: [id number] ?Date of Birth: 12-30-1965 ? ?Transition of Care (TOC) CM/SW Contact  ?[name], RN ?Phone Number: ?05/14/2021, 8:04 AM ? ?Clinical Narrative:    ?CM spoke with Junious Silk, NP and she states that she was unable to reach the patient's wife by phone.  I called and left her a message to have the patient's wife call the practitioner back for a medical discussion regarding the patient's condition. ? ?05/14/2021 1111 - CM spoke with Dr. Mahala Menghini and he states that he has been unable to reach the patient's family by phone.  I called and left a message with the patient's son, Seann Genther on voicemail to have him return the call from the MD to discuss the patient's medical condition. ? ?05/14/2021 1435 - CM received a message from the patient's son, Johnpaul Gillentine, through text that he was in clinicals at the hospital and he would have his mother call Rennis Harding, NP back on the phone to discuss the change in the patient's neurological status and medical condition at this time.  CT was ordered by the provider and Dr. Pearlean Brownie, Neurology plans to see the patient this afternoon.   ? ?CM and MSW with DTP Team will continue to follow the patient for TOC needs. ? ? ?Expected Discharge Plan: Home w Home Health Services ?Barriers to Discharge: Continued Medical Work up, No SNF bed ? ?Expected Discharge Plan and Services ?Expected Discharge Plan: Home w Home Health Services ?In-house Referral: Clinical Social Work, Hospice / Palliative Care, PCP / Health Connect ?Discharge Planning Services: CM Consult ?Post Acute Care Choice: Home Health ?Living arrangements for the past 2 months: Single Family Home ?                ?DME Arranged: Hospital bed, Trach supplies, Tube feeding, Oxygen ?DME Agency: [employer] ?Date DME Agency Contacted: 03/18/21 ?Time DME Agency Contacted: 1000 ?Representative spoke with at DME Agency: Jenness Corner, CM with [employer] ?  ?  ?  ?  ?  ? ? ?Social Determinants of Health (SDOH) Interventions ?  ? ?Readmission Risk Interventions ? ?  02/20/2021  ?  9:31 AM  ?Readmission Risk Prevention Plan  ?Transportation Screening Complete  ?Medication Review Oceanographer) Complete  ?PCP or Specialist appointment within 3-5 days of discharge Not Complete  ?PCP/Specialist Appt Not Complete comments Patient will need LTC placement at SNF facility.  ?HRI or Home Care Consult Complete  ?SW Recovery Care/Counseling Consult Complete  ?Palliative Care Screening Complete  ?Skilled Nursing Facility Complete  ? ? ?

## 2021-05-14 NOTE — Progress Notes (Addendum)
Patient seen and examined independently and I collaborated with nurse practitioner ? ?-patient has had further change overnight and CT is notable for possible stroke ?Regardless of repeat CT that is pending for this afternoon, I agree with Dr. Cecil Cobbs assessment that MRI and other management would probably not change this patient's exceedingly poor prognosis ? ?He is nonresponsive except to painful stimuli and although glabellar tap is positive I cannot really get any meaningful response out of him-I would estimate his GCS is less than 4 ? ?I await objective data in terms of CT scan to be able to speak more confidently with the patient's wife ?Greatly appreciate stroke team revisiting the patient and collaborating with [place] ? ?Spoke with [name], patient wife [phone] and asked her to come to the hospital for a conference with Dr. Ascencion Dike and Ms. Lissa Merlin.  She states that she will try as she wants her son to be present and he needs to be off from work to do so ? ?Critical care has been made aware peripherally of the patient's change in status by Ms. Lissa Merlin ? ? ? ?

## 2021-05-14 NOTE — Procedures (Signed)
Patient Name: Stephen Delgado  ?MRN: [id number]  ?Epilepsy Attending: Charlsie Quest  ?Referring Physician/Provider: Russella Dar, NP ?Date: 05/13/2021 ?Duration: 21.50 mins ? 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 EEG to evaluate for seizure. ? ?Level of alertness: lethargic  ? ?AEDs during EEG study: None ? ?Technical aspects: This EEG study was done with scalp electrodes positioned according to the 10-20 International system of electrode placement. Electrical activity was acquired at a sampling rate of 500Hz  and reviewed with a high frequency filter of 70Hz  and a low frequency filter of 1Hz . EEG data were recorded continuously and digitally stored.  ? ?Description:  EEG showed continuous generalized 3 to 6 Hz theta-delta slowing.  Hyperventilation and photic stimulation were not performed.    ?  ?Of note, study was technically difficult due to significant myogenic artifact.  ? ?ABNORMALITY ?- Continuous slow, generalized ?  ?IMPRESSION: ?This technically difficult study is suggestive of moderate diffuse encephalopathy, nonspecific etiology. No seizures or epileptiform discharges were seen throughout the recording. ? ? ?Stephen Delgado  ? ?

## 2021-05-14 NOTE — Progress Notes (Addendum)
After ?Progress Note ?  ?  ?Patient: Stephen Delgado [id number] DOB: 03/19/1965 DOA: 12/30/2020     122 ?DOS: the patient was seen and examined on 05/01/2021 ?  ?    ? ?  ?Brief hospital course: ?56 y.o. male with PMH significant for DM2, HTN, CKD with questionable compliance to medications ?Patient presented to the ED on 12/30/2020 with complaint of headache, altered mental status ?  ?In the ED, he was hypertensive to 238/160, agitated, required restraints ?CT head showed large amount of intraventricular hemorrhage involving lateral/third/fourth ventricle, ICH and left basal ganglia as well as chronic microvascular ischemic changes. ?He was started on Cleviprex drip and admitted to neuro ICU ?  ?He subsequently had CT head repeated on 11/30, 12/1 and 12/3, all demonstrated unchanged intraparenchymal hemorrhage and intraventricular hemorrhage ?12/3, MRI brain from also showed numerous small acute infarcts in both cerebral hemisphere with minimal improvement in the right superior cerebellum ?12/3, intubated ?12/13, tracheostomy ?12/15, wean from vent to trach collar ?12/16, PEG tube placement ?12/16, repeat MRI showed an increase in the size and number of acute infarcts in the bilateral hemispheres with increased involvement in the cerebellum bilaterally.   ?12/18, transferred from neuro service to [facility]  ?12/21, palliative care consulted.  Family wanted to continue aggressive care as full CODE STATUS. ?12/23 developed Cheyne-Stokes respiratory pattern with respiratory alkalosis.  Transferred to ICU for short-term mechanical ventilation and was found to have new tracheobronchitis now on cefepime ?12/25 back to progressive floor and to TRH ?  ?His hospital course has been complicated by significant neurological impairment, aspiration events, AKI, hypernatremia, urinary retention. ?1/9 trach changed to #6.0 Shiley cuffless--1/10 > 30 days since trach inserted. ?  ? ?**On the afternoon of 4/12 nursing page provider  stating patient's blood pressure remained elevated despite all medications given as ordered.  Patient was also noted with increased stiffness and left upper extremity and proclivity to turn head to the right.  No obvious shaking noted but there is concerned over seizure activity.  Attending physician came by to evaluate patient and did not think the activity was seizures but agreed with previous orders for CT of the head, Ativan as needed and maintaining adequate blood pressure control.  Subsequently CT of the head returned positive for possible new ischemic area.  Neurology was consulted and this was their assessment: ? I suspect it was posturing rather than seizure that was seen earlier. If recurrent events continue, could consider LTM EEG but would not pursue that at this time.As to the CT finding, this could represent artifact given his movement, but if it is real, it is in the anterior watershed region and given his extensive watershed infarcts, would expect it to correspond to evolution of his watershed ischemia rather than some other acute process. In any case, I d not think it would change management at the current time and therefore do not feel strongly about an MRI. His prognosis continues to be poor.  ? ?Of note medical staff as well as neurology have repeatedly documented that patient has a nonrecoverable neurological injury.  On the morning of 4/13 I attempted to call both the patient's wife and son but was unable to reach either and messages left on voicemail ? 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  ?-On the afternoon of 4/12 patient's blood pressure continued to  remain elevated despite antihypertensive medications.  Note this has been somewhat of a Rona Ravens for the past few weeks but worse on 4/12.  Nurse also noted some activity where patient was keeping head position to the right and was difficult to reposition towards center and was having some stiffness in LUE.  CT scan was obtained as well as EEG.  CT revealed possible new stroke by hypodense region in the right frontal lobe on the right consistent with a new infarct.  EEG without seizure activity.  Dr. Leonel Ramsay evaluated the patient and reviewed the CT scan.  He felt that possibly movement was responsible for the abnormal area but since it is in the watershed area if this is a true stroke it would only cause further extension.  Of note no additional work-up indicated given already underlying severe state with poor prognosis and additional work-up would not change the outcome.  He is not a candidate for thrombolytics. ?-Try to keep SBP less than or equal to 160 ?-Neurological exam on 4/13 has worsened.  Patient keeping eyes closed and when lids are pulled open he either has partially fixed gaze to the left or to the right.  Eyes do move and attempt to go center they are very disconjugate and erratic.  Also patient noted with lateral nystagmus.  When pain is applied to R UE it elicits L UE decorticate posturing and no response to deep pain when applied to both lower extremity i.e. severe nailbed pressure ?-Discussed recent worsening of neurological exam with attending and he recommended asking the neurological team to come by and reevaluate patient and to make documentation of what we are seeing noting that the patient is still a full code ? ?**Discussed with Dr. Leonie Man allergy who asked that I get a follow-up CT of the head and he would see the patient later today ? ?1445 returned to reassess the patient continues to have abnormal gaze when you open his lids initially his eyes will be towards the front but will quickly  moved to a bilateral downward position.  At 1 point I vertical nystagmus as opposed to recent horizontal nystagmus. ?  ?End-of-life discussion ?Given recurrent issues of emesis and ileus type symptoms with no clear etiology discussed with wife that this is a poor prognostic indicator.  She stated that she noticed he did better with a lower rate of tube feeding but I reminded her that this is not enough to provide adequate nutrition and sustain life.  I encouraged her and her family to have a discussion regarding this since her husband currently is a full code and if something happen we would have to resuscitate him.  I also reported that I was asking the gastroenterology service to evaluate patient since the emesis looked like it may have component contained old blood.  I did educate her that patient was already on twice daily Protonix which should be preventing any ulcers.  Given patient's poor clinical status and prognosis I told her that the gastroenterology team may be reluctant to proceed with any type of endoscopic evaluation.  I let her know that he was only running low-grade fevers with a white count had increased so I was performing another infectious work-up despite having just completed cefepime for UTI and pneumonia. ? ?Patient's condition has once again worsened as of 4/12 and it appears he has had another stroke with worsening clinical  neurological signs involving posturing.  Voicemails left for wife and son to let them know the patient's condition had changed and that we needed to talk about what this means for Shamir's future.  Upon exam on 4/13 from a neurological standpoint he is worsened as compared to the a.m. of 4/12.  When I went in the room his eyes were closed but upon opening his eyelids his gaze was fixated to the left.  I closed his eyelids and a few minutes later I opened his eyes and now the gaze was fixated to the right.  EOMs are very disconjugate and irregular noting left and right eye  movements occur independently and there is lateral nystagmus. ? ?A physical therapy consult is indicated based on the patient?s mobility assessment. ?  ?  ?Mobility Assessment (last 72 hours)   ?  ?  Mobility

## 2021-05-15 LAB — BASIC METABOLIC PANEL
Anion gap: 4 — ABNORMAL LOW (ref 5–15)
BUN: 29 mg/dL — ABNORMAL HIGH (ref 6–20)
CO2: 21 mmol/L — ABNORMAL LOW (ref 22–32)
Calcium: 8.9 mg/dL (ref 8.9–10.3)
Chloride: 127 mmol/L — ABNORMAL HIGH (ref 98–111)
Creatinine, Ser: 1.12 mg/dL (ref 0.61–1.24)
GFR, Estimated: >60 mL/min (ref >60)
Glucose, Bld: 190 mg/dL — ABNORMAL HIGH (ref 70–99)
Potassium: 3.6 mmol/L (ref 3.5–5.1)
Sodium: 152 mmol/L — ABNORMAL HIGH (ref 135–145)

## 2021-05-15 LAB — CBC WITH DIFFERENTIAL/PLATELET
Abs Immature Granulocytes: 0.1 10*3/uL — ABNORMAL HIGH (ref 0.00–0.07)
Basophils Absolute: 0 10*3/uL (ref 0.0–0.1)
Basophils Relative: 0 %
Eosinophils Absolute: 0.3 10*3/uL (ref 0.0–0.5)
Eosinophils Relative: 3 %
HCT: 27.3 % — ABNORMAL LOW (ref 39.0–52.0)
Hemoglobin: 8.4 g/dL — ABNORMAL LOW (ref 13.0–17.0)
Immature Granulocytes: 1 %
Lymphocytes Relative: 16 %
Lymphs Abs: 1.5 10*3/uL (ref 0.7–4.0)
MCH: 24.6 pg — ABNORMAL LOW (ref 26.0–34.0)
MCHC: 30.8 g/dL (ref 30.0–36.0)
MCV: 79.8 fL — ABNORMAL LOW (ref 80.0–100.0)
Monocytes Absolute: 0.7 10*3/uL (ref 0.1–1.0)
Monocytes Relative: 7 %
Neutro Abs: 7 10*3/uL (ref 1.7–7.7)
Neutrophils Relative %: 73 %
Platelets: 268 10*3/uL (ref 150–400)
RBC: 3.42 MIL/uL — ABNORMAL LOW (ref 4.22–5.81)
RDW: 17.9 % — ABNORMAL HIGH (ref 11.5–15.5)
WBC: 9.6 10*3/uL (ref 4.0–10.5)
nRBC: 0 % (ref 0.0–0.2)

## 2021-05-15 LAB — GLUCOSE, CAPILLARY
Glucose-Capillary: 140 mg/dL — ABNORMAL HIGH (ref 70–99)
Glucose-Capillary: 164 mg/dL — ABNORMAL HIGH (ref 70–99)
Glucose-Capillary: 154 mg/dL — ABNORMAL HIGH (ref 70–99)
Glucose-Capillary: 156 mg/dL — ABNORMAL HIGH (ref 70–99)
Glucose-Capillary: 140 mg/dL — ABNORMAL HIGH (ref 70–99)
Glucose-Capillary: 125 mg/dL — ABNORMAL HIGH (ref 70–99)

## 2021-05-15 MED ORDER — ACETAMINOPHEN 325 MG PO TABS
650.0000 mg | ORAL_TABLET | ORAL | Status: DC
Start: 2021-05-16 — End: 2021-05-18
  Administered 2021-05-16 – 2021-05-18 (×14): 650 mg
  Filled 2021-05-15 (×14): qty 2

## 2021-05-15 NOTE — Procedures (Signed)
Tracheostomy Change Note ? ?Patient Details:   ?Name: Stephen Delgado ?DOB: 06/15/1965 ?MRN: [id number] ?   ?Airway Documentation: ?   ? ?Evaluation ? O2 sats: stable throughout ?Complications: No apparent complications ?Patient did tolerate procedure well. ?Bilateral Breath Sounds: Diminished, Rhonchi ?  ? ?Trach changed per RT protocol. Color change noted once new trach was inserted. Patient tolerated well without complications.  ? ?Eligio Angert A Kito Cuffe ?05/15/2021, 3:10 PM ?

## 2021-05-15 NOTE — Progress Notes (Signed)
Patient seen today by trach team for consult.  No education is needed at this time.  All necessary equipment is at beside.   Will continue to follow for progression.  

## 2021-05-15 NOTE — Progress Notes (Addendum)
STROKE TEAM PROGRESS NOTE  ? ?INTERVAL HISTORY ?Patient is seen in his room with no family at the bedside.  He was admitted on 12/30/20 with a left sided IPH with IVH.  He was intubated shortly after admission, and tracheostomy/PEG tube have been placed.  On 01/03/21, he was found to have numerous small ischemic infarcts in bilateral hemispheres on MRI.  He has remained in a vegetative state.  GOC discussions have been held, and family wishes to proceed with aggressive treatment.  On 4/12, he was noted to have an episode of hypertension, turning of his head to the right and stiffness of the LUE.  Head CT was performed, and he was found to have a possible new infarct in the right frontal lobe. However repeat Ct head next day did not confirm any definite new infarct. MRI was not performed as it would likely not change course of care.  GOC conversation to be held with patient's family today. ? ?Vitals:  ? 05/15/21 0708 05/15/21 0917 05/15/21 1132 05/15/21 1153  ?BP: (!) 159/77  (!) 161/81   ?Pulse: (!) 102 100 93 96  ?Resp: 20 20 (!) 22 (!) 22  ?Temp: 98.7 ?F (37.1 ?C)  98.7 ?F (37.1 ?C)   ?TempSrc: Axillary  Oral   ?SpO2: 97% 98% 99% 98%  ?Weight:      ?Height:      ? ?CBC:  ?Recent Labs  ?Lab 05/14/21 ?6767 05/15/21 ?0734  ?WBC 11.4* 9.6  ?NEUTROABS 8.6* 7.0  ?HGB 7.4* 8.4*  ?HCT 25.1* 27.3*  ?MCV 82.0 79.8*  ?PLT 274 268  ? ?Basic Metabolic Panel:  ?Recent Labs  ?Lab 05/10/21 ?0414 05/11/21 ?0718 05/14/21 ?2094 05/15/21 ?0734  ?NA 150*   < > 153* 152*  ?K 3.4*   < > 3.9 3.6  ?CL 125*   < > 128* 127*  ?CO2 18*   < > 19* 21*  ?GLUCOSE 124*   < > 150* 190*  ?BUN 21*   < > 29* 29*  ?CREATININE 1.20   < > 1.08 1.12  ?CALCIUM 9.2   < > 9.0 8.9  ?MG 1.9  --   --   --   ? < > = values in this interval not displayed.  ? ?Lipid Panel: No results for input(s): CHOL, TRIG, HDL, CHOLHDL, VLDL, LDLCALC in the last 168 hours. ?HgbA1c: No results for input(s): HGBA1C in the last 168 hours. ?Urine Drug Screen: No results for  input(s): LABOPIA, COCAINSCRNUR, LABBENZ, AMPHETMU, THCU, LABBARB in the last 168 hours.  ?Alcohol Level No results for input(s): ETH in the last 168 hours. ? ?IMAGING past 24 hours ?CT HEAD WO CONTRAST ( ) ? ?Result Date: 05/14/2021 ?CLINICAL DATA:  Acute neuro deficit rule out stroke. Altered mental status EXAM: CT HEAD WITHOUT CONTRAST TECHNIQUE: Contiguous axial images were obtained from the base of the skull through the vertex without intravenous contrast. RADIATION DOSE REDUCTION: This exam was performed according to the departmental dose-optimization program which includes automated exposure control, adjustment of the mA and/or kV according to patient size and/or use of iterative reconstruction technique. COMPARISON:  CT head 05/13/2021 FINDINGS: Brain: Moderate atrophy. Extensive chronic ischemic changes with hypodense chronic infarcts in the basal ganglia and white matter bilaterally. Chronic infarcts in the cerebellum bilaterally. Negative for acute infarct, hemorrhage, mass. Image quality degraded by motion. Vascular: Negative for hyperdense vessel. Linear calcification left sylvian fissure most likely left middle cerebral artery calcification, unchanged from the prior study. Skull: No acute abnormality Sinuses/Orbits: Near  complete opacification left maxillary sinus unchanged. Mucosal edema sphenoid sinus unchanged. Negative orbit Other: None IMPRESSION: Motion degraded study Atrophy and severe chronic ischemic changes. No acute abnormality no change from the prior CT. Electronically Signed   By: Franchot Gallo M.D.   On: 05/14/2021 18:29   ? ?PHYSICAL EXAM ?General:  African-American patient with tracheostomy and contractures of BUE ?Respiratory:  Regular, unlabored respirations on trach collar ?Neurological:Exam :  ?Opens eyes intermittently but unresponsive and does not follow commands.  PERRL with roving eye movements present, does not look at examiner or follow objects.  Does not blink to threat.   Will open eyes to sternal rub and will flicker BLE to noxious stimuli ?Minimal withdrawal in LE and slightly more movement in UEs to sternal rub. ?ASSESSMENT/PLAN ?Stephen Delgado is a 56 y.o. male with history of DM2, CKD, HTN who was admitted on 12/30/20 with a left sided IPH with IVH.  He was intubated shortly after admission, and tracheostomy/PEG tube have been placed.  On 12/3, he was found to have numerous small ischemic infarcts in bilateral hemispheres.  He has remained in a vegetative state.  [place] discussions have been held, and family wishes to proceed with aggressive treatment.  On 4/12, he was noted to have an episode of hypertension, turning of his head to the right and stiffness of the LUE.  Head CT was performed, and he was found to have a possible new infarct in the right frontal lobe.  MRI was not performed as it would likely not change course of care.  [place] conversation to be held with patient's family today. ? ? 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 : and bilateral lacunar infarcts:  left basal ganglia ICH with IVH likely secondary to hypertensive emergency with bicerebral multiple subcortical lacunar infarcts as well with no improvement and in persistent vegetative state ?CT head new hypodense region in right frontal lobe, extensive chronic microvascular changes with multifocal infarcts in cerebrum and cerebellum ?MRI  12/3 numerous small acute infarcts in bilateral cerebral hemispheres, left caudate ICH, IVH and scattered SAH ?MRA  12/3 stable ICH, negative for LVO ?Carotid Doppler  1-39% stenosis in bilateral carotids ?2D Echo EF 99991111, grade 1 diastolic dysfunction, no atrial level shunt ?HgbA1c 6.3 ?VTE prophylaxis - SCDs ?   ?Diet  ? Diet NPO time specified  ? ?aspirin 81 mg daily prior to admission, now on No antithrombotic secondary to Bromide ?Therapy recommendations:  none ?Disposition:  pending ? 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 ?Hypertension ?Currently on amlodipine 10 mg daily, carvedilol 12.5 mg daily and clonidine patch 0.2 mg ?Stable ?Permissive hypertension (OK if < 220/120) but gradually normalize in 5-7 days ?Long-term BP goal normotensive ? ?Hyperlipidemia ?Home meds:  none ?Add statin at discharge ? ?Diabetes type II Controlled ?Home meds:  none ?HgbA1c 6.3, goal < 7.0 ?CBGs ?Recent Labs  ?  05/15/21 ?0426 05/15/21 ?[id number] 05/15/21 ?1145  ?GLUCAP 140* 164* 154*  ?  ?SSI ? ?Other Stroke Risk Factors ?Obesity, Body mass index is 33.23 kg/m?., BMI >/= 30 associated with increased stroke risk, recommend weight loss, diet and exercise as appropriate  ?Hx stroke ? ? ?Other Active Problems ? ? ?Hospital day # 136 ? ?[place] , MSN, AGACNP-BC ?[facility] ?See Amion for schedule and pager information ?05/15/2021 1:15  PM ? I have personally obtained history,examined this patient, reviewed notes, independently viewed imaging studies, participated in medical decision making and plan of care.ROS completed by me personally and pertinent positives fully documented  I have made any additions or clarifications directly to the above note. Agree with note above. Patient`s neurological condition remains extremely poor with near vegetative state on exam and chances of meaningful recovery are nonexistent. Pursuing full support would seem futile care in my opinion. Long discussion with wife and son and answered questions. Family wants time to think about goals of care.D/w Dr Verlon Au.I have spent a total of  50  minutes with the patient reviewing hospital notes,  test results, labs and examining the patient as well as establishing an assessment and plan that was discussed personally with the patient.  > 50% of time was spent in direct patient care. ?  ?  ? ? ? ?Antony Contras, MD ?Medical Director ?[facility] ?Pager: [phone] ?05/15/2021 4:38 PM ? ? ?To contact Stroke Continuity provider, please refer to [email or web address] ?After hours, contact General Neurology  ?

## 2021-05-15 NOTE — Progress Notes (Addendum)
Nutrition Follow-up ? ?DOCUMENTATION CODES:  ?Severe malnutrition in context of chronic illness ? ?INTERVENTION:  ?Obtain weights weekly  ? ?Continue TF via PEG: ?-Osmolite 1.5 @ goal rate of 27m/hr ?-441mProsource TF BID ?-Free water per MD/NP, currently 10017m8H ?Recommend increasing free water flushes due to hypernatremia; consider 150m37mee water Q4H to reach 1997ml22mal free water ? ?At goal, TF regimen will provide 2240 kcals, 112 grams protein, 1097ml 60m water  ? ?NUTRITION DIAGNOSIS:  ?Severe Malnutrition related to chronic illness as evidenced by severe muscle depletion, severe fat depletion. -- ongoing ? ?GOAL:  ?Patient will meet greater than or equal to 90% of their needs -- addressing with TF, met when TF at goal ? ?MONITOR:  ?TF tolerance, Labs, I & O's, Skin ? ?REASON FOR ASSESSMENT:  ?Ventilator, Consult ?Enteral/tube feeding initiation and management ? ?ASSESSMENT:  ?55 yea15old male who presented to the ED on 11/29 with AMS. PMH of HTN, T2DM, CKD stage III. 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 ? ?11/30 cortrak placed; tip gastric ?12/2 trickle TF ?12/3 pt intubated due to increased WOB, +emesis, TF held ?12/4 trickle TF restarted ?12/5 advancement orders placed ?12/9 TF adjusted ?12/13 s/p tracheostomy ?12/16 s/p EGD and PEG placement ?12/19 tx out of ICU to TRH  ?[facility] developed Cheyne-Stokes respiratory pattern with respiratory alkalosis.Transferred to ICU for short-term mechanical ventilation and was found to have new tracheobronchitis now on cefepime ?12/25 tx back to TRH ?[place] trach changed to #6 shiley cuffless  ?1/10 marks 30 days since trach placed ?2/17 trach changed   ?3/03 trach changed  ?3/04 SBO resolved ? ?Pt continues to be minimally responsive, tolerating ATC and continues to be NPO requiring TF via PEG. Per CCM, no plans to decannulate as pt does not have the airway protection to manage his own oral secretions.   ? ?Pt has had recurrent issues of emesis and ileus type symptoms with no clear etiology. NP discussed this with pt's wife and noted that it is a poor prognostic indicator. GI has been consulted to re-evaluated pt; however, NP noted that they may be reluctant to proceed with any type of endoscoping evaluation given pt's poor clinical status/poor prognosis.  ? ?Patient's condition has once again worsened as of 4/12 and it appears pt has had another stroke with worsening clinical neurological signs involving posturing. Neurology w/o plans to do further work-up or change medical management given pt's poor prognosis -- stated pt has a nonrecoverable neurological injury. Pt currently remains full code, but medical team is pursuing further GOC coEmmarsations with pt's wife and son.   ? ?Current TF orders: Osmolite 1.5 @ 60ml/h40m 45ml Pr19mrce TF BID and 100ml fre38mter Q8H ? ?TF currently running at goal rate of 60ml/hr. 33m?UOP: 800ml x24 h69m ?I/O: +20.3L since admit ? ?Admit wt 85.2 kg ?Current wt 111 kg (updated 3/28) ?  ?Medications: reglan, protonix, SSI Q4H ?Labs: Na 152, hgb 8.4 ?CBGs: 117-162 x 24 hours ? ?Diet Order:   ?Diet Order   ? ?       ?  Diet NPO time specified  Diet effective midnight       ?  ? ?  ?  ? ?  ? ?EDUCATION NEEDS:  ?Not appropriate for education at this time ? ?Skin:  Skin Assessment: Reviewed RN Assessment ? ?Last BM:  4/12 ? ?Height:  ?Ht Readings from Last 1 Encounters:  ?01/23/21 6' (1.829 m)  ? ?  Weight:  ?Wt Readings from Last 1 Encounters:  ?04/28/21 111.1 kg  ? ?BMI:  Body mass index is 33.23 kg/m?. ? ?Estimated Nutritional Needs:  ?Kcal:  2150-2350 ?Protein:  105-120 grams ?Fluid:  >2L ? ? ?[name]., MS, RD, LDN (she/her/hers) ?RD pager number and weekend/on-call pager number located in [place]. ? ?

## 2021-05-15 NOTE — Chronic Care Management (AMB) (Signed)
Long discussion with patient family Dr Leonie Man of neurology as well as Ms. Lissa Merlin ?We had a good goals of care discussion at the bedside-present was the patient's wife [name] as well as patient's son ? ?Dr Leonie Man detailed natural history of devastating stroke clearly to the patient's family in no uncertain terms and while it is hoped that some recovery is possible, 4 months out from his [place] he has made declines and has now started to exhibit primitive reflexes and response to pain and stimuli and has ocular bobbing ? ?The patient has an exceedingly poor prognosis-we did broach the topic of decision making in the next 48 hours with regards to transition to full comfort-I revisited with the patient's wife at around 4:15 PM and asked her if she had any thoughts about our discussion earlier today-she mentions that the patient has a brother in [place] and several family members out of town-I have urged her to consolidate some of these discussions over the next 24 hours as I do not feel discussions will change the overall outcome-we will reevaluate and revisit this issue again in a.m. ? ? ?Verneita Griffes, MD ?[facility] ?5:33 PM ? ? ? ?

## 2021-05-15 NOTE — Progress Notes (Signed)
After ?Progress Note ?  ?  ?Patient: Stephen Delgado [id number] DOB: 03/19/1965 DOA: 12/30/2020     122 ?DOS: the patient was seen and examined on 05/01/2021 ?  ?    ? ?  ?Brief hospital course: ?56 y.o. male with PMH significant for DM2, HTN, CKD with questionable compliance to medications ?Patient presented to the ED on 12/30/2020 with complaint of headache, altered mental status ?  ?In the ED, he was hypertensive to 238/160, agitated, required restraints ?CT head showed large amount of intraventricular hemorrhage involving lateral/third/fourth ventricle, ICH and left basal ganglia as well as chronic microvascular ischemic changes. ?He was started on Cleviprex drip and admitted to neuro ICU ?  ?He subsequently had CT head repeated on 11/30, 12/1 and 12/3, all demonstrated unchanged intraparenchymal hemorrhage and intraventricular hemorrhage ?12/3, MRI brain from also showed numerous small acute infarcts in both cerebral hemisphere with minimal improvement in the right superior cerebellum ?12/3, intubated ?12/13, tracheostomy ?12/15, wean from vent to trach collar ?12/16, PEG tube placement ?12/16, repeat MRI showed an increase in the size and number of acute infarcts in the bilateral hemispheres with increased involvement in the cerebellum bilaterally.   ?12/18, transferred from neuro service to [facility]  ?12/21, palliative care consulted.  Family wanted to continue aggressive care as full CODE STATUS. ?12/23 developed Cheyne-Stokes respiratory pattern with respiratory alkalosis.  Transferred to ICU for short-term mechanical ventilation and was found to have new tracheobronchitis now on cefepime ?12/25 back to progressive floor and to TRH ?  ?His hospital course has been complicated by significant neurological impairment, aspiration events, AKI, hypernatremia, urinary retention. ?1/9 trach changed to #6.0 Shiley cuffless--1/10 > 30 days since trach inserted. ?  ? ?**On the afternoon of 4/12 nursing page provider  stating patient's blood pressure remained elevated despite all medications given as ordered.  Patient was also noted with increased stiffness and left upper extremity and proclivity to turn head to the right.  No obvious shaking noted but there is concerned over seizure activity.  Attending physician came by to evaluate patient and did not think the activity was seizures but agreed with previous orders for CT of the head, Ativan as needed and maintaining adequate blood pressure control.  Subsequently CT of the head returned positive for possible new ischemic area.  Neurology was consulted and this was their assessment: ? I suspect it was posturing rather than seizure that was seen earlier. If recurrent events continue, could consider LTM EEG but would not pursue that at this time.As to the CT finding, this could represent artifact given his movement, but if it is real, it is in the anterior watershed region and given his extensive watershed infarcts, would expect it to correspond to evolution of his watershed ischemia rather than some other acute process. In any case, I d not think it would change management at the current time and therefore do not feel strongly about an MRI. His prognosis continues to be poor.  ? ?Of note medical staff as well as neurology have repeatedly documented that patient has a nonrecoverable neurological injury.  On the morning of 4/13 I attempted to call both the patient's wife and son but was unable to reach either and messages left on voicemail ? 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  ?-Wife and son updated on 4/13 regarding worsening neurological  exam.  At the time of discussion initial CT showed a possible stroke but follow-up CT showed no stroke but again CT imaging may be too early to pick up small lesions on the brainstem ?-Evaluated by neurologist Dr.Sethi who agrees patient is having clinical signs of a brainstem injury and he and Dr. Mahala Menghini plan to speak with wife and family at 1 PM today ?-On exam today continues to have brainstem clinical signs such as eye bobbing and disconjugate gaze. ?  ?End-of-life discussion ?See above regarding recent change in status and update to family regarding changes.  On 4/13 explained to wife that patient not a good candidate for resuscitation and would not survive but at this time she did not wish to make that decision.  Family discussion to be held today at 1 PM between attending physician and neurologist ? ?A physical therapy consult is indicated based on the patient?s mobility assessment. ?  ?  ?Mobility Assessment (last 72 hours)   ?  ?  Mobility Assessment   ?  ?  Row Name 03/29/21 2000 03/29/21 1800 03/29/21 1400 03/29/21 1200 03/29/21 0800  ?  Does patient have an order for bedrest or is patient medically unstable Yes- Bedfast (Level 1) - Complete Yes- Bedfast (Level 1) - Complete Yes- Bedfast (Level 1) - Complete Yes- Bedfast (Level 1) - Complete Yes- Bedfast (Level 1) - Complete  ?  What is the highest level of mobility based on the progressive mobility assessment? Level 1 (Bedfast) - Unable to balance while sitting on edge of bed -- Level 1 (Bedfast) - Unable to balance while sitting on edge of bed Level 1 (Bedfast) - Unable to balance while sitting on edge of bed Level 1 (Bedfast) - Unable to balance while sitting on edge of bed  ?  Is the above level different from baseline mobility prior to current illness? No - Consider discontinuing PT/OT -- Yes - Recommend PT order Yes - Recommend PT order Yes - Recommend PT order  ?  ?  Row Name 03/28/21 2000 03/28/21 0800   ?    ?    ?   ?  Does patient have an  order for bedrest or is patient medically unstable Yes- Bedfast (Level 1) - Complete Yes- Bedfast (Level 1) - Complete        ?  What is the highest level of mobility based on the progressive mobility assessment? Level 1 (Bedfast) - Unable to balance while sitting on edge of bed Level 1 (Bedfast) - Unable to balance while sitting on edge of bed        ?  Is the above level different from baseline mobility prior to current illness? No - Consider discontinuing PT/OT No - Consider discontinuing PT/OT        ?  ?   ?  ?  ?   ?  ?Hypernatremia with metabolic acidosis/hypokalemia/mild acute kidney injury ?Sodium 152-continue lactated Ringer's ?Potassium stable at 3.6 ?Renal function normal ? ?Microcytic anemia ?As of 4/3 hgb down to 6.9 ?After 2 units PRBCs on 4/3 hemoglobin up to 10 ?Iron low at 32 with a low TIBC and elevated ferritin.  Discussed with attending at this time we will hold off on giving iron- patient did get iron replacement with the packed red blood cells ?As of 4/5 hemoglobin up to 9.2 after transfusion but as of 4/12 hemoglobin back down  to 7.7.  Continue to follow./14 hemoglobin 8.4 with low MCV ? ?Recent Enterobacter UTI ?Resolved ?Low-grade fevers recently which are likely neurological in nature.  Follow-up urine culture revealed 20,000 colonies of Enterobacter and at that time he had no leukocytosis so attending physician recommended no indication to initiate antibiotics ?  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 ?Malignant hypertension ?Blood pressure has progressively become more difficult to control over the past 1-1/2 to 2-weeks ?Current regimen as follows: ?Norvasc 10 mg daily ?Carvedilol 12.5 mg twice daily ?Catapres patch 0.2 mg weekly ?Isordil 20 mg  3 times daily ?Cozaar 50 mg daily ? ?Protein-calorie malnutrition/dysphagia due to recent stroke/constipation ?Nutrition Problem: Inadequate oral intake ?Etiology: lethargy/confusion, dysphagia ?Signs/Symptoms: N

## 2021-05-16 MED ORDER — HEPARIN SODIUM (PORCINE) 5000 UNIT/ML IJ SOLN
5000.0000 [IU] | Freq: Three times a day (TID) | INTRAMUSCULAR | Status: DC
  Administered 2021-05-16 – 2021-05-22 (×17): 5000 [IU] via SUBCUTANEOUS
  Filled 2021-05-16 (×17): qty 1

## 2021-05-16 MED ORDER — FREE WATER
200.0000 mL | Freq: Four times a day (QID) | Status: DC
  Administered 2021-05-17 – 2021-05-20 (×15): 200 mL

## 2021-05-16 NOTE — Progress Notes (Signed)
?PROGRESS NOTE ? ? Stephen Delgado  [id number] DOB: [birth year] DOA: 12/30/2020 ?PCP: Patient, [name]  ?Brief Narrative:  ? ?56 year old black male headache altered mental status 12/30/2020 intraventricular hemorrhage admitted to neuro ICU  ?He has been seen several times by neurology palliative care and has had further acute decompensations on 4/12 with decerebrate posturing etc. he remains full code despite multiple discussions this weekend with his family and we are in the process of continuing those conversations ?If the patient family is willing we will pursue comfort measures otherwise we will invoke the futility policy as both myself and neurology agree that aggressive measures are futile and that with further decerebrate and primitive reflexes, his prognosis is abysmal ? ?Hospital-Problem based course ? 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 ? ?Current main issue ?Becoming progressively hypernatremic (pattern is stable however at 152 for past 4 days)--this is likely secondary to insensible losses and free water losses-coincides with mild rise in BUN ?We will increase free water--100 cc q. 8 to 200 cc every 6 and repeat labs in the next 24 to 48 hours ? ?I have started heparin prophylactic 2/2 immobile state ? ?Patient has made no changes today  ? ? ?DVT prophylaxis: Heparin ?Code Status: Remains full code ?Family Communication: See detailed note by myself yesterday ?Disposition:  ?Status is: Inpatient difficult disposition ongoing goals of care discussions ?Remains inpatient appropriate because:  ? ? ?  ?Consultants:  ?Several ? ?Procedures: None ? ?Antimicrobials:  No ? ? ?Subjective: ?Unresponsive, nonmeaningful and primitive reflexes in response to noxious stimuli ? ?Objective: ?Vitals:  ? 05/16/21 1201 05/16/21 1211 05/16/21 1537 05/16/21 1644  ?BP: 119/62   (!) 157/100  ?Pulse: 84 86 83 93  ?Resp: 18 19 18 18   ?Temp: 99.3 ?F (37.4 ?C)   99.4 ?F (37.4 ?C)  ?TempSrc: Axillary   Axillary  ?SpO2: 100% 97% 96% 97%  ?Weight:      ?Height:      ? ? ?Intake/Output Summary (Last 24 hours) at 05/16/2021 1758 ?Last data filed at 05/16/2021 0417 ?Gross per 24 hour  ?Intake --  ?Output 950 ml  ?Net -950 ml  ? ?Filed Weights  ? 03/16/21 0500 04/13/21 1700 04/28/21 0500  ?Weight: 83.5 kg 108 kg 111.1 kg  ? ? ?Examination: ? ?Disconjugate gaze-GCS about 5 ?Chest clear ?Shiley 6 uncuffed ?S1-S2 no murmur ?Uncoordinated movement to deep pressure-no oculogyrus at this time however ?Lower extremities and Prevalon boots ?PEG in place with abdominal binder ?Sacrum not examined today ? ?Data Reviewed: personally reviewed  ? ?CBC ?   ?Component Value Date/Time  ? WBC 9.6 05/15/2021 0734  ? RBC 3.42 (L) 05/15/2021 0734  ? HGB 8.4 (L) 05/15/2021 0734  ? HCT 27.3 (L) 05/15/2021 0734  ? PLT 268 05/15/2021 0734  ? MCV 79.8 (L) 05/15/2021 0734  ? MCH 24.6 (L) 05/15/2021 0734  ? MCHC 30.8 05/15/2021 0734  ? RDW 17.9 (H) 05/15/2021 0734  ? LYMPHSABS 1.5 05/15/2021 0734  ? MONOABS 0.7 05/15/2021 0734  ? EOSABS 0.3 05/15/2021 0734  ? BASOSABS 0.0 05/15/2021 0734  ? ? ?  Latest Ref Rng & Units 05/15/2021  ?  7:34 AM 05/14/2021  ?  9:14 AM 05/13/2021  ?  5:00 AM  ?CMP  ?Glucose 70 -  99 mg/dL 190   150   143    ?BUN 6 - 20 mg/dL 29   29   25     ?Creatinine 0.61 - 1.24 mg/dL 1.12   1.08   1.16    ?Sodium 135 - 145 mmol/L 152   153   152    ?Potassium 3.5 - 5.1 mmol/L 3.6   3.9   5.1    ?Chloride 98 - 111 mmol/L 127   128   129    ?CO2 22 - 32 mmol/L 21   19   18     ?Calcium 8.9 - 10.3 mg/dL 8.9   9.0   9.1    ? ? ? ?Radiology Studies: ?CT HEAD WO CONTRAST (5MM) ? ?Result Date: 05/14/2021 ?CLINICAL DATA:  Acute neuro  deficit rule out stroke. Altered mental status EXAM: CT HEAD WITHOUT CONTRAST TECHNIQUE: Contiguous axial images were obtained from the base of the skull through the vertex without intravenous contrast. RADIATION DOSE REDUCTION: This exam was performed according to the departmental dose-optimization program which includes automated exposure control, adjustment of the mA and/or kV according to patient size and/or use of iterative reconstruction technique. COMPARISON:  CT head 05/13/2021 FINDINGS: Brain: Moderate atrophy. Extensive chronic ischemic changes with hypodense chronic infarcts in the basal ganglia and white matter bilaterally. Chronic infarcts in the cerebellum bilaterally. Negative for acute infarct, hemorrhage, mass. Image quality degraded by motion. Vascular: Negative for hyperdense vessel. Linear calcification left sylvian fissure most likely left middle cerebral artery calcification, unchanged from the prior study. Skull: No acute abnormality Sinuses/Orbits: Near complete opacification left maxillary sinus unchanged. Mucosal edema sphenoid sinus unchanged. Negative orbit Other: None IMPRESSION: Motion degraded study Atrophy and severe chronic ischemic changes. No acute abnormality no change from the prior CT. Electronically Signed   By: Franchot Gallo M.D.   On: 05/14/2021 18:29   ? ? ?Scheduled Meds: ? acetaminophen  650 mg Per Tube Q4H  ? amLODipine  10 mg Per Tube Daily  ? carvedilol  12.5 mg Per Tube BID WC  ? chlorhexidine  15 mL Mouth Rinse BID  ? cloNIDine  0.2 mg Transdermal Weekly  ? feeding supplement (PROSource TF)  45 mL Per Tube BID  ? free water  100 mL Per Tube Q8H  ? insulin aspart  0-20 Units Subcutaneous Q4H  ? isosorbide dinitrate  20 mg Per Tube TID  ? losartan  50 mg Per Tube Daily  ? mouth rinse  15 mL Mouth Rinse q12n4p  ? metoCLOPramide (REGLAN) injection  10 mg Intravenous Q6H  ? pantoprazole (PROTONIX) IV  40 mg Intravenous Q12H  ? polyvinyl alcohol  2 drop Left Eye Q6H   ? ?Continuous Infusions: ? feeding supplement (OSMOLITE 1.5 CAL) 1,000 mL (05/15/21 0929)  ? methocarbamol (ROBAXIN) IV 500 mg (05/16/21 1429)  ? methocarbamol (ROBAXIN) IV    ? ? ? LOS: 137 days  ? ?Time spent: 26 ? ?[name], MD ?[facility] ?To contact the attending provider between 7A-7P or the covering provider during after hours 7P-7A, please log into the web site [email or web address] and access using universal [place] password for that web site. If you do not have the password, please call the hospital operator. ? ?05/16/2021, 5:58 PM  ? ? ?

## 2021-05-16 NOTE — Plan of Care (Signed)
?  Problem: Health Behavior/Discharge Planning: ?Goal: Ability to manage health-related needs will improve ?Outcome: Progressing ?  ?Problem: Clinical Measurements: ?Goal: Ability to maintain clinical measurements within normal limits will improve ?Outcome: Progressing ?Goal: Will remain free from infection ?Outcome: Progressing ?Goal: Diagnostic test results will improve ?Outcome: Progressing ?Goal: Respiratory complications will improve ?Outcome: Progressing ?Goal: Cardiovascular complication will be avoided ?Outcome: Progressing ?  ?Problem: Activity: ?Goal: Risk for activity intolerance will decrease ?Outcome: Progressing ?  ?Problem: Nutrition: ?Goal: Adequate nutrition will be maintained ?Outcome: Progressing ?  ?Problem: Coping: ?Goal: Level of anxiety will decrease ?Outcome: Progressing ?  ?Problem: Elimination: ?Goal: Will not experience complications related to bowel motility ?Outcome: Progressing ?Goal: Will not experience complications related to urinary retention ?Outcome: Progressing ?  ?Problem: Pain Managment: ?Goal: General experience of comfort will improve ?Outcome: Progressing ?  ?Problem: Safety: ?Goal: Ability to remain free from injury will improve ?Outcome: Progressing ?  ?Problem: Skin Integrity: ?Goal: Risk for impaired skin integrity will decrease ?Outcome: Progressing ?  ?Problem: Education: ?Goal: Knowledge of disease or condition will improve ?Outcome: Progressing ?Goal: Knowledge of secondary prevention will improve (SELECT ALL) ?Outcome: Progressing ?Goal: Knowledge of patient specific risk factors will improve (INDIVIDUALIZE FOR PATIENT) ?Outcome: Progressing ?Goal: Individualized Educational Video(s) ?Outcome: Progressing ?  ?Problem: Coping: ?Goal: Will verbalize positive feelings about self ?Outcome: Progressing ?Goal: Will identify appropriate support needs ?Outcome: Progressing ?  ?Problem: Health Behavior/Discharge Planning: ?Goal: Ability to manage health-related needs will  improve ?Outcome: Progressing ?  ?Problem: Self-Care: ?Goal: Ability to participate in self-care as condition permits will improve ?Outcome: Progressing ?Goal: Verbalization of feelings and concerns over difficulty with self-care will improve ?Outcome: Progressing ?Goal: Ability to communicate needs accurately will improve ?Outcome: Progressing ?  ?Problem: Nutrition: ?Goal: Risk of aspiration will decrease ?Outcome: Progressing ?Goal: Dietary intake will improve ?Outcome: Progressing ?  ?Problem: Intracerebral Hemorrhage Tissue Perfusion: ?Goal: Complications of Intracerebral Hemorrhage will be minimized ?Outcome: Progressing ?  ?

## 2021-05-17 LAB — COMPREHENSIVE METABOLIC PANEL
ALT: 50 U/L — ABNORMAL HIGH (ref 0–44)
AST: 32 U/L (ref 15–41)
Albumin: 2.2 g/dL — ABNORMAL LOW (ref 3.5–5.0)
Alkaline Phosphatase: 54 U/L (ref 38–126)
Anion gap: 8 (ref 5–15)
BUN: 28 mg/dL — ABNORMAL HIGH (ref 6–20)
CO2: 21 mmol/L — ABNORMAL LOW (ref 22–32)
Calcium: 8.9 mg/dL (ref 8.9–10.3)
Chloride: 119 mmol/L — ABNORMAL HIGH (ref 98–111)
Creatinine, Ser: 1.01 mg/dL (ref 0.61–1.24)
GFR, Estimated: >60 mL/min (ref >60)
Glucose, Bld: 150 mg/dL — ABNORMAL HIGH (ref 70–99)
Potassium: 3.7 mmol/L (ref 3.5–5.1)
Sodium: 148 mmol/L — ABNORMAL HIGH (ref 135–145)
Total Bilirubin: 0.5 mg/dL (ref 0.3–1.2)
Total Protein: 6.5 g/dL (ref 6.5–8.1)

## 2021-05-17 LAB — GLUCOSE, CAPILLARY: Glucose-Capillary: 124 mg/dL — ABNORMAL HIGH (ref 70–99)

## 2021-05-17 NOTE — Progress Notes (Signed)
?PROGRESS NOTE ? ? Stephen Delgado  [id number] DOB: Jan 05, 1966 DOA: 12/30/2020 ?PCP: Patient, [name]  ?Brief Narrative:  ? ?56 year old black male headache altered mental status 12/30/2020 intraventricular hemorrhage admitted to neuro ICU  ?He has been seen several times by neurology palliative care and has had further acute decompensations on 4/12 with decerebrate posturing etc. he remains full code despite multiple discussions this weekend with his family and we are in the process of continuing those conversations ?If the patient family is willing we will pursue comfort measures otherwise we will invoke the futility policy as both myself and neurology agree that aggressive measures are futile and that with further decerebrate and primitive reflexes, his prognosis is abysmal ? ?Hospital-Problem based course ? 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 ?Oe ?BP (!) 158/87 (BP Location: Left Arm)   Pulse 81   Temp 98.7 ?F (37.1 ?C) (Axillary)   Resp 16   Ht 6' (1.829 m)   Wt 111.1 kg   SpO2 99%   BMI 33.23 kg/m?  ?Patient no longer has ocular bobbing he is able to respond minimally and this is a change from prior ?No longer decerebrate-Babinski is equivocal however ?Wife notes that he was able to move his right leg today?-I am unable to confirm ?Abdomen is soft ?Lurline Idol remains in place ? ? ?Current main issue ?Repeat Chem-12 CBC in a.m.  ?continue free water 200 cc every 6 ?I have started heparin prophylactic 2/2 immobile state ? ?Long discussion with Stephen Delgado at bedside today--she tells me she does not want her husband to go to hospice and maintains that she wants to take him  home ?When I asked her what that means she says she wants to care for him in the home and does not believe that the patient will die ?I think this is actually possible given the fact that he is on minimal oxygen settings has a cuffless trach and if he can get bolus feeds in addition to free water and periodic labs along with a trach training for the family (apparently a cousin in the family has an RT license) then he may be able to go home later this week ?I will discuss the case with Ms. Stephen Delgado in the morning ?I have already reached out to respiratory therapy as the patient's wife is at the bedside and asked them to to start the teaching process ? ?Patient has made no changes today  ? ? ?DVT prophylaxis: Heparin ?Code Status: Remains full code ?Family Communication: See detailed note by myself yesterday ?Disposition:  ?Status is: Inpatient difficult disposition ongoing goals of care discussions ?Remains inpatient appropriate because:  ? ? ?  ?Consultants:  ?Several ? ?Procedures: None ? ?Antimicrobials: No ? ? ?Subjective: ? ?No longer has ocular bobbing ? ?Objective: ?Vitals:  ? 05/17/21 1105 05/17/21 1149 05/17/21 1600 05/17/21 1620  ?BP:  (!) 155/87  (!) 158/87  ?Pulse: 80 85 82 81  ?Resp: 18 20 18 16   ?Temp:  98.6 ?F (37 ?C)  98.7 ?F (37.1 ?C)  ?TempSrc:  Oral  Axillary  ?SpO2: 96% 99% 99% 99%  ?Weight:      ?Height:      ? ? ?Intake/Output Summary (Last 24 hours) at 05/17/2021 1748 ?Last data filed at 05/16/2021 2317 ?  Gross per 24 hour  ?Intake --  ?Output 300 ml  ?Net -300 ml  ? ? ?Filed Weights  ? 03/16/21 0500 04/13/21 1700 04/28/21 0500  ?Weight: 83.5 kg 108 kg 111.1 kg  ? ? ?Examination: ? ?Disconjugate gaze-GCS about 5 still ?Chest clear ?Shiley 6 uncuffed ?S1-S2 no murmur ?Withdraws to pain ? ?Data Reviewed: personally reviewed  ? ?CBC ?   ?Component Value Date/Time  ? WBC 9.6 05/15/2021 0734  ? RBC 3.42 (L) 05/15/2021 0734  ? HGB 8.4 (L) 05/15/2021 0734  ? HCT 27.3 (L) 05/15/2021 0734  ? PLT 268  05/15/2021 0734  ? MCV 79.8 (L) 05/15/2021 0734  ? MCH 24.6 (L) 05/15/2021 0734  ? MCHC 30.8 05/15/2021 0734  ? RDW 17.9 (H) 05/15/2021 0734  ? LYMPHSABS 1.5 05/15/2021 0734  ? MONOABS 0.7 05/15/2021 0734  ? EOSABS 0.3 05/15/2021 0734  ? BASOSABS 0.0 05/15/2021 0734  ? ? ?  Latest Ref Rng & Units 05/15/2021  ?  7:34 AM 05/14/2021  ?  9:14 AM 05/13/2021  ?  5:00 AM  ?CMP  ?Glucose 70 - 99 mg/dL 190   150   143    ?BUN 6 - 20 mg/dL 29   29   25     ?Creatinine 0.61 - 1.24 mg/dL 1.12   1.08   1.16    ?Sodium 135 - 145 mmol/L 152   153   152    ?Potassium 3.5 - 5.1 mmol/L 3.6   3.9   5.1    ?Chloride 98 - 111 mmol/L 127   128   129    ?CO2 22 - 32 mmol/L 21   19   18     ?Calcium 8.9 - 10.3 mg/dL 8.9   9.0   9.1    ? ? ? ?Radiology Studies: ?No results found. ? ? ?Scheduled Meds: ? acetaminophen  650 mg Per Tube Q4H  ? amLODipine  10 mg Per Tube Daily  ? carvedilol  12.5 mg Per Tube BID WC  ? chlorhexidine  15 mL Mouth Rinse BID  ? cloNIDine  0.2 mg Transdermal Weekly  ? feeding supplement (PROSource TF)  45 mL Per Tube BID  ? free water  200 mL Per Tube Q6H  ? heparin  5,000 Units Subcutaneous Q8H  ? insulin aspart  0-20 Units Subcutaneous Q4H  ? isosorbide dinitrate  20 mg Per Tube TID  ? losartan  50 mg Per Tube Daily  ? mouth rinse  15 mL Mouth Rinse q12n4p  ? metoCLOPramide (REGLAN) injection  10 mg Intravenous Q6H  ? pantoprazole (PROTONIX) IV  40 mg Intravenous Q12H  ? polyvinyl alcohol  2 drop Left Eye Q6H  ? ?Continuous Infusions: ? feeding supplement (OSMOLITE 1.5 CAL) 1,000 mL (05/17/21 0055)  ? methocarbamol (ROBAXIN) IV 500 mg (05/17/21 1342)  ? methocarbamol (ROBAXIN) IV    ? ? ? LOS: 138 days  ? ?Time spent: 26 ? ?[name], MD ?[facility] ?To contact the attending provider between 7A-7P or the covering provider during after hours 7P-7A, please log into the web site [email or web address] and access using universal [place] password for that web site. If you do not have the password, please call  the hospital operator. ? ?05/17/2021, 5:48 PM  ? ? ?

## 2021-05-17 NOTE — Progress Notes (Signed)
MD called to inform that patients wife will take the patient home soon and requested trach education started.  Wife was in the room, I gave the trach education book to the wife.  I suggested that she read through the trach book to help her familiarize with the information, and to also bring book back w/ any questions.  We discussed what various equipment is called, & some anatomy, she witnessed and discussed the suction process, she witnessed trach care earlier.  Wife is aware that the trach team will need to have further education with her, I have left a note with the trach team to contact wife, per wife she isn't sure if she will be back tomorrow.   ? ? ? ?

## 2021-05-18 LAB — GLUCOSE, CAPILLARY
Glucose-Capillary: 102 mg/dL — ABNORMAL HIGH (ref 70–99)
Glucose-Capillary: 131 mg/dL — ABNORMAL HIGH (ref 70–99)
Glucose-Capillary: 131 mg/dL — ABNORMAL HIGH (ref 70–99)
Glucose-Capillary: 131 mg/dL — ABNORMAL HIGH (ref 70–99)
Glucose-Capillary: 136 mg/dL — ABNORMAL HIGH (ref 70–99)
Glucose-Capillary: 137 mg/dL — ABNORMAL HIGH (ref 70–99)
Glucose-Capillary: 139 mg/dL — ABNORMAL HIGH (ref 70–99)
Glucose-Capillary: 145 mg/dL — ABNORMAL HIGH (ref 70–99)
Glucose-Capillary: 148 mg/dL — ABNORMAL HIGH (ref 70–99)
Glucose-Capillary: 150 mg/dL — ABNORMAL HIGH (ref 70–99)
Glucose-Capillary: 154 mg/dL — ABNORMAL HIGH (ref 70–99)
Glucose-Capillary: 158 mg/dL — ABNORMAL HIGH (ref 70–99)
Glucose-Capillary: 159 mg/dL — ABNORMAL HIGH (ref 70–99)
Glucose-Capillary: 162 mg/dL — ABNORMAL HIGH (ref 70–99)
Glucose-Capillary: 166 mg/dL — ABNORMAL HIGH (ref 70–99)
Glucose-Capillary: 169 mg/dL — ABNORMAL HIGH (ref 70–99)
Glucose-Capillary: 171 mg/dL — ABNORMAL HIGH (ref 70–99)

## 2021-05-18 LAB — CBC
HCT: 27.8 % — ABNORMAL LOW (ref 39.0–52.0)
Hemoglobin: 8.3 g/dL — ABNORMAL LOW (ref 13.0–17.0)
MCH: 24.2 pg — ABNORMAL LOW (ref 26.0–34.0)
MCHC: 29.9 g/dL — ABNORMAL LOW (ref 30.0–36.0)
MCV: 81 fL (ref 80.0–100.0)
Platelets: 263 10*3/uL (ref 150–400)
RBC: 3.43 MIL/uL — ABNORMAL LOW (ref 4.22–5.81)
RDW: 18.1 % — ABNORMAL HIGH (ref 11.5–15.5)
WBC: 7.8 10*3/uL (ref 4.0–10.5)
nRBC: 0.3 % — ABNORMAL HIGH (ref 0.0–0.2)

## 2021-05-18 LAB — CBC WITH DIFFERENTIAL/PLATELET
Abs Immature Granulocytes: 0.13 10*3/uL — ABNORMAL HIGH (ref 0.00–0.07)
Basophils Absolute: 0.1 10*3/uL (ref 0.0–0.1)
Basophils Relative: 1 %
Eosinophils Absolute: 0.3 10*3/uL (ref 0.0–0.5)
Eosinophils Relative: 4 %
HCT: 25.8 % — ABNORMAL LOW (ref 39.0–52.0)
Hemoglobin: 7.7 g/dL — ABNORMAL LOW (ref 13.0–17.0)
Immature Granulocytes: 2 %
Lymphocytes Relative: 18 %
Lymphs Abs: 1.5 10*3/uL (ref 0.7–4.0)
MCH: 24.1 pg — ABNORMAL LOW (ref 26.0–34.0)
MCHC: 29.8 g/dL — ABNORMAL LOW (ref 30.0–36.0)
MCV: 80.6 fL (ref 80.0–100.0)
Monocytes Absolute: 0.6 10*3/uL (ref 0.1–1.0)
Monocytes Relative: 7 %
Neutro Abs: 5.9 10*3/uL (ref 1.7–7.7)
Neutrophils Relative %: 68 %
Platelets: 245 10*3/uL (ref 150–400)
RBC: 3.2 MIL/uL — ABNORMAL LOW (ref 4.22–5.81)
RDW: 18 % — ABNORMAL HIGH (ref 11.5–15.5)
WBC: 8.5 10*3/uL (ref 4.0–10.5)
nRBC: 0.2 % (ref 0.0–0.2)

## 2021-05-18 LAB — BASIC METABOLIC PANEL
Anion gap: 9 (ref 5–15)
BUN: 27 mg/dL — ABNORMAL HIGH (ref 6–20)
CO2: 21 mmol/L — ABNORMAL LOW (ref 22–32)
Calcium: 9 mg/dL (ref 8.9–10.3)
Chloride: 117 mmol/L — ABNORMAL HIGH (ref 98–111)
Creatinine, Ser: 0.99 mg/dL (ref 0.61–1.24)
GFR, Estimated: >60 mL/min (ref >60)
Glucose, Bld: 166 mg/dL — ABNORMAL HIGH (ref 70–99)
Potassium: 3.7 mmol/L (ref 3.5–5.1)
Sodium: 147 mmol/L — ABNORMAL HIGH (ref 135–145)

## 2021-05-18 MED ORDER — HALOPERIDOL LACTATE 5 MG/ML IJ SOLN
INTRAMUSCULAR | Status: AC
  Filled 2021-05-18: qty 1

## 2021-05-18 MED ORDER — ACETAMINOPHEN 325 MG PO TABS
650.0000 mg | ORAL_TABLET | Freq: Four times a day (QID) | ORAL | Status: DC
  Administered 2021-05-18 – 2021-05-22 (×16): 650 mg
  Filled 2021-05-18 (×17): qty 2

## 2021-05-18 NOTE — Progress Notes (Signed)
After ?Progress Note ?  ?  ?Patient: Stephen Delgado [id number] DOB: 1966/01/08 DOA: 12/30/2020     122 ?DOS: the patient was seen and examined on 05/01/2021 ?  ?    ? ?  ?Brief hospital course: ?56 y.o. male with PMH significant for DM2, HTN, CKD with questionable compliance to medications ?Patient presented to the ED on 12/30/2020 with complaint of headache, altered mental status ?  ?In the ED, he was hypertensive to 238/160, agitated, required restraints ?CT head showed large amount of intraventricular hemorrhage involving lateral/third/fourth ventricle, ICH and left basal ganglia as well as chronic microvascular ischemic changes. ?He was started on Cleviprex drip and admitted to neuro ICU ?  ?He subsequently had CT head repeated on 11/30, 12/1 and 12/3, all demonstrated unchanged intraparenchymal hemorrhage and intraventricular hemorrhage ?12/3, MRI brain from also showed numerous small acute infarcts in both cerebral hemisphere with minimal improvement in the right superior cerebellum ?12/3, intubated ?12/13, tracheostomy ?12/15, wean from vent to trach collar ?12/16, PEG tube placement ?12/16, repeat MRI showed an increase in the size and number of acute infarcts in the bilateral hemispheres with increased involvement in the cerebellum bilaterally.   ?12/18, transferred from neuro service to [facility]  ?12/21, palliative care consulted.  Family wanted to continue aggressive care as full CODE STATUS. ?12/23 developed Cheyne-Stokes respiratory pattern with respiratory alkalosis.  Transferred to ICU for short-term mechanical ventilation and was found to have new tracheobronchitis now on cefepime ?12/25 back to progressive floor and to TRH ?  ?His hospital course has been complicated by significant neurological impairment, aspiration events, AKI, hypernatremia, urinary retention. ?1/9 trach changed to #6.0 Shiley cuffless--1/10 > 30 days since trach inserted. ?  ? ?**On the afternoon of 4/12 nursing page provider  stating patient's blood pressure remained elevated despite all medications given as ordered.  Patient was also noted with increased stiffness and left upper extremity and proclivity to turn head to the right.  No obvious shaking noted but there is concerned over seizure activity.  Attending physician came by to evaluate patient and did not think the activity was seizures but agreed with previous orders for CT of the head, Ativan as needed and maintaining adequate blood pressure control.  Subsequently CT of the head returned positive for possible new ischemic area.  Neurology was consulted and this was their assessment: ? I suspect it was posturing rather than seizure that was seen earlier. If recurrent events continue, could consider LTM EEG but would not pursue that at this time.As to the CT finding, this could represent artifact given his movement, but if it is real, it is in the anterior watershed region and given his extensive watershed infarcts, would expect it to correspond to evolution of his watershed ischemia rather than some other acute process. In any case, I d not think it would change management at the current time and therefore do not feel strongly about an MRI. His prognosis continues to be poor.  ? ?Of note medical staff as well as neurology have repeatedly documented that patient has a nonrecoverable neurological injury.  On the morning of 4/13 I attempted to call both the patient's wife and son but was unable to reach either and messages left on voicemail.  Fortunately over the weekend brainstem symptoms resolved.  Patient returned to his previous baseline level of functioning.  Sodium remained stable under 150 on free water.  If patient continues to improve plan is to discharge home with family later this week ? ?  Assessment and Plan: ?Brain injury 2/2 acute intraparenchymal and intraventricular hemorrhage/Bilateral multifocal ischemic infarcts with assoc muscle hypertonicity  ?- Given and recent  ileus type symptoms with associated recurrent nausea and vomiting Zyprexa, baclofen, Symmetrel were discontinued noting no significant improvement in symptomatology with the use of these meds.  ?-Wife and son updated on 4/13 regarding worsening neurological exam.  At the time of discussion initial CT showed a possible stroke but follow-up CT showed no stroke but again CT imaging may be too early to pick up small lesions on the brainstem ?-Evaluated by neurologist Dr.Sethi who agrees patient is having clinical signs of a brainstem injury.  Wife and family updated regarding these changes but they remain hopeful that patient will completely recover.  They stated they have seen his right leg moves spontaneously recently.  As of this weekend brainstem symptoms resolved and patient return to previous level of baseline functioning.  It is suspected that patient had a tiny neurological injury in the brainstem area that was not detectable upon CT and patient quickly recovered from this injury to baseline. ?  ?End-of-life discussion ?See above regarding recent change in status and update to family regarding changes.  On 4/13 explained to wife that patient not a good candidate for resuscitation and would not survive.  Family can continue to remain hope that patient will fully recover and wish no changes and treatment regimen or CODE STATUS. ?A physical therapy consult is indicated based on the patient?s mobility assessment. ?  ?  ?Mobility Assessment (last 72 hours)   ?  ?  Mobility Assessment   ?  ?  [place] Name 03/29/21 2000 03/29/21 1800 03/29/21 1400 03/29/21 1200 03/29/21 0800  ?  Does patient have an order for bedrest or is patient medically unstable Yes- Bedfast (Level 1) - Complete Yes- Bedfast (Level 1) - Complete Yes- Bedfast (Level 1) - Complete Yes- Bedfast (Level 1) - Complete Yes- Bedfast (Level 1) - Complete  ?  What is the highest level of mobility based on the progressive mobility assessment? Level 1 (Bedfast) -  Unable to balance while sitting on edge of bed -- Level 1 (Bedfast) - Unable to balance while sitting on edge of bed Level 1 (Bedfast) - Unable to balance while sitting on edge of bed Level 1 (Bedfast) - Unable to balance while sitting on edge of bed  ?  Is the above level different from baseline mobility prior to current illness? No - Consider discontinuing PT/OT -- Yes - Recommend PT order Yes - Recommend PT order Yes - Recommend PT order  ?  ?  [place] Name 03/28/21 2000 03/28/21 0800   ?    ?    ?   ?  Does patient have an order for bedrest or is patient medically unstable Yes- Bedfast (Level 1) - Complete Yes- Bedfast (Level 1) - Complete        ?  What is the highest level of mobility based on the progressive mobility assessment? Level 1 (Bedfast) - Unable to balance while sitting on edge of bed Level 1 (Bedfast) - Unable to balance while sitting on edge of bed        ?  Is the above level different from baseline mobility prior to current illness? No - Consider discontinuing PT/OT No - Consider discontinuing PT/OT        ?  ?   ?  ?  ?   ?  ?Hypernatremia with metabolic acidosis/hypokalemia/mild acute kidney injury ?Sodium improved  with addition of free water but need to avoid too high of volume since in the past this is contributed to vomiting ?CO2 to up to 21 as of 4/17 ?Potassium stable at 3.7 on 4/17 so we will repeat electrolytes today ? ?Microcytic anemia ?As of 4/3 hgb down to 6.9 ?After 2 units PRBCs on 4/3 hemoglobin up to 10 ?Iron low at 32 with a low TIBC and elevated ferritin.  Discussed with attending at this time we will hold off on giving iron- patient did get iron replacement with the packed red blood cells ?4/17 hemoglobin 7.7 so repeat today on 4/18 ? ?Recent Enterobacter UTI ?Resolved ?Low-grade fevers recently which are likely neurological in nature.  Follow-up urine culture revealed 20,000 colonies of Enterobacter and at that time he had no leukocytosis so attending physician recommended no  indication to initiate antibiotics ?  ?Acute respiratory failure w/tracheostomy dependence/recurrent Serratia pneumonia and STAPH ?Continue routine trach care ?Clarified w/ bedside nurse that he requires suct

## 2021-05-18 NOTE — Progress Notes (Addendum)
11:45am: ?CSW spoke with Molly Maduro of [place] who states the agency is ready to follow the patient at discharge. [facility] will provide 84 hours a week of in home care to the patient. Robert requesting patient be discharged home with an extra same size trach, an extra downsized trach, and an ambu bag. Patient needs to be seen by a PCP within 7 days of discharge. CSW sent updated clinicals to [facility] for review. ? ?CSW spoke with Dois Davenport at [employer] to make a referral however the agency is no longer accepting patient's for home services. ? ?10am: ?CSW attempted to reach [employer] of [place] without success - a voicemail was left requesting a return call. ? ?CSW spoke with Ian Malkin of [employer] to have supplies and equipment ordered including a hospital bed, trach supplies + suctioning device, and tube feeds. ? ?[name], MSW, LCSW ?Transitions of Care  Clinical Social Worker II ?[phone] ? ?

## 2021-05-18 NOTE — Progress Notes (Addendum)
?  Spoke with wife [name] today to set up times to educate and teach trach care. She is not  able to come in today.  She has agreed to come in  2 pm on Tuesday April 18 th and ?11:00 am on this Wednesday and Thursday.  April  19 th and 20 th  If she has any questions or need to change times she can call Respiratory Dept at [phone]. ?

## 2021-05-18 NOTE — Progress Notes (Signed)
? ?NAME:  Stephen Delgado, MRN:  [id number], DOB:  11-17-1965, LOS: 139 ?ADMISSION DATE:  12/30/2020, CONSULTATION DATE:  12/19 ?REFERRING MD:  Erlinda Hong, CHIEF COMPLAINT:  Dyspnea  ? ?History of Present Illness:  ?56 yo male presented with headache, nausea and altered mental status.  CT head showed acute Lt ICH with IVH likely related to hypertensive emergency (BP 195/116). Had persistent hypertension and concern for airway protection, and PCCM asked to assist with ICU management.  Tracheostomy 12/13.  Moved out of ICU.  ? ?Pertinent  Medical History  ?HTN, DM type 2, CKD 3a, OSA ? ?Significant Hospital Events: ?Including procedures, antibiotic start and stop dates in addition to other pertinent events   ?11/29 Admit, start cleviprex ?12/01 PCCM consulted; changed from cleviprex to cardene due to elevated triglycerides ?12/03 intubated; episode of vomiting >> tube feeds held ?12/04 resume trickle tube feeds ?12/06 remains minimally responsive. Tolerating SBT.  ?12/12 no acute events overnight, T-max 102 point ?12/13 bedside trach planned midmorning  ?12/14 ATC. Lasix --> 5L UOP  ?12/15 cont on trach collar. Na to 154 from 145. Adding low rate d5  ?12/16 scheduled for PEG. Cont D5. Continues on trach collar ?Out of ICU 12/19 ?12/23 Cheyne-Stokes respirations noted and was transferred to ICU for short-term ventilation.  ?12/25 Returned to floor ?1/9 Trach changed to 6 cuffless  ?2/6 Trach follow up, no major changes, remains on ATC  ?2/27 trach f/u, on TC, seems to attend briefly, otherwise poor neuro exam ?04/06/2021 Trach FU, On ATC at 28%, no change in MS, neuro exam ? ?Imaging/Studies: ?CT head 11/29 >> large amount of IVH involving lateral/3rd/4th ventricles, ICH in Lt BG, chronic microvascular ischemic changes ?Echo 11/30 >> EF 50 to 55%, mod LVH, grade 1 DD, ascending aorta 37 mm ?EEG 12/01 >> continuous generalized slowing ?MRI brain 12/03 >> numerous small acute infarcts in b/l cerebral hemispheres, Lt caudate  hemorrhage, IVH, scattered sulcal SAH, mild communicating hydrocephalus ?CT head 12/23 Expected evolution of strokes, no shift or herniation ? ? ? ?Interim History / Subjective:  ?No events. ?Remains on TC. ?Change in [place] to try to get patient home noted ? ?Objective   ?Blood pressure (!) 153/92, pulse 86, temperature 98.6 ?F (37 ?C), temperature source Oral, resp. rate 18, height 6' (1.829 m), weight 111.1 kg, SpO2 100 %. ?   ?FiO2 (%):  [21 %-28 %] 21 %  ? ?Intake/Output Summary (Last 24 hours) at 05/18/2021 1015 ?Last data filed at 05/18/2021 0700 ?Gross per 24 hour  ?Intake 8220.43 ml  ?Output 1125 ml  ?Net 7095.43 ml  ? ? ?Filed Weights  ? 03/16/21 0500 04/13/21 1700 04/28/21 0500  ?Weight: 83.5 kg 108 kg 111.1 kg  ? ?Physical exam  ?No distress ?Disconjugate gaze, pupils reactive ?Thick secretions ?Localizes to pain, not following any commands ?Muscle wasting and contraction noted ? ?Labs reviewed, look stable ? ?Resolved Hospital Problem list   ? ? ?Assessment & Plan:  ? 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 ?Continue pulmonary hygiene and standard trach care. ?Continue nebulized bronchodilators ?Would not decannulate as long as he does not have the  airway protection to manage his own oral secretions ?We will continue to follow weekly, next 4/24 ?Please reach out before patient is discharged so we can arrange trach clinic f/u. ? ?Erskine Emery MD PCCM ?

## 2021-05-19 LAB — GLUCOSE, CAPILLARY
Glucose-Capillary: 153 mg/dL — ABNORMAL HIGH (ref 70–99)
Glucose-Capillary: 175 mg/dL — ABNORMAL HIGH (ref 70–99)
Glucose-Capillary: 148 mg/dL — ABNORMAL HIGH (ref 70–99)
Glucose-Capillary: 141 mg/dL — ABNORMAL HIGH (ref 70–99)
Glucose-Capillary: 151 mg/dL — ABNORMAL HIGH (ref 70–99)
Glucose-Capillary: 144 mg/dL — ABNORMAL HIGH (ref 70–99)

## 2021-05-19 NOTE — Progress Notes (Signed)
CSW spoke with Dr.Wyatt and Dr. Mahala Menghini regarding patient - both MD's in agreement for discharge this Friday after patient's wife receives adequate training. ? ?CSW spoke with [place] at [facility] to schedule an appointment. The appointment is 05/26/2021 at 10:30am with Dr. Eliezer Bottom. The MD will visit the patient in the home.This information was added to patient's AVS for further reference. ? ?[name], MSW, LCSW ?Transitions of Care  Clinical Social Worker II ?[phone] ? ?

## 2021-05-19 NOTE — Progress Notes (Signed)
After ?Progress Note ?  ?  ?Patient: Stephen Delgado [id number] DOB: Apr 09, 1965 DOA: 12/30/2020     122 ?DOS: the patient was seen and examined on 05/01/2021 ?  ?    ? ?  ?Brief hospital course: ?56 y.o. male with PMH significant for DM2, HTN, CKD with questionable compliance to medications ?Patient presented to the ED on 12/30/2020 with complaint of headache, altered mental status ?  ?In the ED, he was hypertensive to 238/160, agitated, required restraints ?CT head showed large amount of intraventricular hemorrhage involving lateral/third/fourth ventricle, ICH and left basal ganglia as well as chronic microvascular ischemic changes. ?He was started on Cleviprex drip and admitted to neuro ICU ?  ?He subsequently had CT head repeated on 11/30, 12/1 and 12/3, all demonstrated unchanged intraparenchymal hemorrhage and intraventricular hemorrhage ?12/3, MRI brain from also showed numerous small acute infarcts in both cerebral hemisphere with minimal improvement in the right superior cerebellum ?12/3, intubated ?12/13, tracheostomy ?12/15, wean from vent to trach collar ?12/16, PEG tube placement ?12/16, repeat MRI showed an increase in the size and number of acute infarcts in the bilateral hemispheres with increased involvement in the cerebellum bilaterally.   ?12/18, transferred from neuro service to [facility]  ?12/21, palliative care consulted.  Family wanted to continue aggressive care as full CODE STATUS. ?12/23 developed Cheyne-Stokes respiratory pattern with respiratory alkalosis.  Transferred to ICU for short-term mechanical ventilation and was found to have new tracheobronchitis now on cefepime ?12/25 back to progressive floor and to TRH ?  ?His hospital course has been complicated by significant neurological impairment, aspiration events, AKI, hypernatremia, urinary retention. ?1/9 trach changed to #6.0 Shiley cuffless--1/10 > 30 days since trach inserted. ? ? ? ?Assessment and Plan: ?Brain injury 2/2 acute  intraparenchymal and intraventricular hemorrhage/Bilateral multifocal ischemic infarcts with assoc muscle hypertonicity  ?- Given and recent ileus type symptoms with associated recurrent nausea and vomiting Zyprexa, baclofen, Symmetrel were discontinued noting no significant improvement in symptomatology with the use of these meds.  ?-Wife and son updated on 4/13 regarding worsening neurological exam.  At the time of discussion initial CT showed a possible stroke but follow-up CT showed no stroke but again CT imaging may be too early to pick up small lesions on the brainstem ?-Evaluated by neurologist Dr.Sethi who agrees patient is having clinical signs of a brainstem injury.  Wife and family updated regarding these changes but they remain hopeful that patient will completely recover.  They stated they have seen his right leg moves spontaneously recently.  As of this weekend brainstem symptoms resolved and patient return to previous level of baseline functioning.  It is suspected that patient had a tiny neurological injury in the brainstem area that was not detectable upon CT and patient quickly recovered from this injury to baseline. ?  ?End-of-life discussion ?See above regarding recent change in status and update to family regarding changes.  On 4/13 explained to wife that patient not a good candidate for resuscitation and would not survive.  Family can continue to remain hope that patient will fully recover and wish no changes and treatment regimen or CODE STATUS. ?A physical therapy consult is indicated based on the patient?s mobility assessment. ?  ?  ?Mobility Assessment (last 72 hours)   ?  ?  Mobility Assessment   ?  ?  [place] Name 03/29/21 2000 03/29/21 1800 03/29/21 1400 03/29/21 1200 03/29/21 0800  ?  Does patient have an order for bedrest or is patient medically unstable Yes-  Bedfast (Level 1) - Complete Yes- Bedfast (Level 1) - Complete Yes- Bedfast (Level 1) - Complete Yes- Bedfast (Level 1) - Complete  Yes- Bedfast (Level 1) - Complete  ?  What is the highest level of mobility based on the progressive mobility assessment? Level 1 (Bedfast) - Unable to balance while sitting on edge of bed -- Level 1 (Bedfast) - Unable to balance while sitting on edge of bed Level 1 (Bedfast) - Unable to balance while sitting on edge of bed Level 1 (Bedfast) - Unable to balance while sitting on edge of bed  ?  Is the above level different from baseline mobility prior to current illness? No - Consider discontinuing PT/OT -- Yes - Recommend PT order Yes - Recommend PT order Yes - Recommend PT order  ?  ?  [place] Name 03/28/21 2000 03/28/21 0800   ?    ?    ?   ?  Does patient have an order for bedrest or is patient medically unstable Yes- Bedfast (Level 1) - Complete Yes- Bedfast (Level 1) - Complete        ?  What is the highest level of mobility based on the progressive mobility assessment? Level 1 (Bedfast) - Unable to balance while sitting on edge of bed Level 1 (Bedfast) - Unable to balance while sitting on edge of bed        ?  Is the above level different from baseline mobility prior to current illness? No - Consider discontinuing PT/OT No - Consider discontinuing PT/OT        ?  ?   ?  ?  ?   ?  ?Hypernatremia with metabolic acidosis/hypokalemia/mild acute kidney injury ?Sodium improved with addition of free water but need to avoid too high of volume since in the past this is contributed to vomiting ?CO2 to up to 21 as of 4/17 ?Potassium stable at 3.7 on 4/17 so we will repeat electrolytes today ? ?Microcytic anemia ?As of 4/3 hgb down to 6.9 ?After 2 units PRBCs on 4/3 hemoglobin up to 10 ?Iron low at 32 with a low TIBC and elevated ferritin.  Discussed with attending at this time we will hold off on giving iron- patient did get iron replacement with the packed red blood cells ?4/17 hemoglobin 7.7 so repeat today on 4/18 ? ?Recent Enterobacter UTI ?Resolved ?Low-grade fevers recently which are likely neurological in nature.   Follow-up urine culture revealed 20,000 colonies of Enterobacter and at that time he had no leukocytosis so attending physician recommended no indication to initiate antibiotics ?  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 ?Malignant hypertension ?Blood pressure has progressively become more difficult to control over the past 1-1/2 to 2-weeks ?Current regimen as follows: ?Norvasc 10 mg daily ?Carvedilol 12.5 mg twice daily ?Catapres patch 0.2 mg weekly ?Isordil 20 mg 3 times daily ?Cozaar 50 mg daily ? ?Protein-calorie malnutrition/dysphagia due to recent stroke/constipation ?Nutrition Problem: Inadequate oral intake ?Etiology: lethargy/confusion, dysphagia ?Signs/Symptoms: NPO status ?Interventions: Tube feeding via PEG Osmolite at a rate of 60 cc/h ?Body mass index is 23.33 kg/m?.  ? ?Diabetes mellitus type 2 in obese [facility]) ?A1c 6.3 in 12/31/2020 ?Continue SSI every 4 hours ?Tradjenta and Glucotrol until able to consistently tolerate tube feeding without emesis ?To follow CBGs and  provide SSI every 4 hours ?  ?Acute conjunctivitis, bilateral ?Resolved continues to have some left eye injection likely due to lack of blink reflex so nurse instructed to begin saline drops ?  ?Pneumonia due to Klebsiella pneumoniae  ?Resolved ?  ?Acute urinary retention ?-Continue Urecholine ?-Foley catheter discontinued on 1/11 and patient voiding easily ?  ?Transaminitis ?- Reemergence of mild nonobstructive transaminitis with ALT currently elevated at 92-May be related to acute infectious process-unable to give per tube medications so Zyprexa on hold ?-Continue to follow lab ?  ?MSSA (methicillin susceptible Staphylococcus aureus) pneumonia ?Resolved ?  ?Pressure injury of  skin ?Resolved-prior anus ulcer ?  ?Pneumonia due to Serratia marcescens ?Resolved ?  ?Acute kidney injury on CKD 3b/acute azotemia with mild hypernatremia ?BUN elevated at 55 with normal creatinine of 1.2 and a GFR greater than 60

## 2021-05-19 NOTE — Discharge Instructions (Addendum)
A follow up appointment has been made for 05/26/2021 at 10:30am with Dr. Eliezer Bottom through [facility]. Dr. Melynda Ripple will visit Renly in the home to provide him with after hospital follow up. ? ?-Continue with trach care ?-Strictly n.p.o., all feeding and medication via PEG tube ? ? ?

## 2021-05-19 NOTE — Progress Notes (Addendum)
[id number]) Hospital Liaison note: ? ?This patient has been referred to [facility] Outpatient  Palliative Care services. Will continue to follow for disposition. ? ?Please call with any outpatient palliative questions or concerns. ? ?Thank you for the opportunity to participate in this patient's care ? ?Thank you, ?Abran Cantor, LPN ?[facility] Hospital Liaison ?[phone] ?

## 2021-05-20 LAB — GLUCOSE, CAPILLARY
Glucose-Capillary: 113 mg/dL — ABNORMAL HIGH (ref 70–99)
Glucose-Capillary: 133 mg/dL — ABNORMAL HIGH (ref 70–99)
Glucose-Capillary: 135 mg/dL — ABNORMAL HIGH (ref 70–99)
Glucose-Capillary: 143 mg/dL — ABNORMAL HIGH (ref 70–99)
Glucose-Capillary: 154 mg/dL — ABNORMAL HIGH (ref 70–99)
Glucose-Capillary: 154 mg/dL — ABNORMAL HIGH (ref 70–99)

## 2021-05-20 MED ORDER — FREE WATER
200.0000 mL | Status: DC
  Administered 2021-05-20 – 2021-05-22 (×12): 200 mL

## 2021-05-20 NOTE — Progress Notes (Signed)
CSW spoke with patient's wife [name] at bedside to discuss final discharge plans. [name] agreeable for patient to discharge home on Friday 05/22/21 at 12pm. ? ?CSW spoke with Durene Fruits to inform him of discharge date and time. ? ?CSW spoke with Leafy Ro at [place] to schedule transportation home at 12pm. ? ?[place] spoke with Thedore Mins with [employer] to discuss DME delivery - [employer] will communicate with patient's wife to schedule delivery. ? ?Madilyn Fireman, MSW, LCSW ?Transitions of Care  Clinical Social Worker II ?[phone] ? ?

## 2021-05-20 NOTE — Progress Notes (Signed)
Patients son at bedside. Son observed /educated on trach care by RT.  Son very attentive and willing to learn.  Son observed dressing change, inner cannula change and tracheal suction.  Son also educated on the importance of humidity.  All questions answered.  RT will continue to monitor.  ?

## 2021-05-20 NOTE — Progress Notes (Signed)
In today with Stephen Delgado's family.  His wife and [name].  Additional trach education and trach teaching done today.  Wife suctioned, cleaned trach  changed inner cannula and also changed trach dressing today. The son observed and took good notes. RT stiff  will continue to work with more hands on training with family.  Plan is to get son to do the same hands on training and do more hands with both the wife and son again tomorrow. ?

## 2021-05-20 NOTE — Progress Notes (Signed)
RT instructed with return demonstration  [name] on trach care. Son was able to demonstrate tracheal suction, inner cannula change and trach dressing change. RT will continue to monitor. ?

## 2021-05-20 NOTE — Progress Notes (Signed)
Teaching sessions attempted earlier during patients care with wife and have had minimal success of participation.  Arrangements made for teaching session with wife/family regarding medication regime, procedures, techniques, trouble shooting and GT care. Wife verbalized she will be here around 11 am, encouraged her to arrive earlier to maintain medication schedule. ?

## 2021-05-20 NOTE — Progress Notes (Signed)
?PROGRESS NOTE ? ? ? Stephen Delgado?Stephen Delgado  AVW:[id number] DOB: July 09, 1965 DOA: 12/30/2020 ?PCP: Patient, [name]  ? ? ? ?Chief Complaint  ?Patient presents with  ? Altered Mental Status  ? ? ?Brief Narrative: ? ?56 y.o. male with PMH significant for DM2, HTN, CKD with questionable compliance to medications ?Patient presented to the ED on 12/30/2020 with complaint of headache, altered mental status ?  ?In the ED, he was hypertensive to 238/160, agitated, required restraints ?CT head showed large amount of intraventricular hemorrhage involving lateral/third/fourth ventricle, ICH and left basal ganglia as well as chronic microvascular ischemic changes. ?He was started on Cleviprex drip and admitted to neuro ICU ?  ?He subsequently had CT head repeated on 11/30, 12/1 and 12/3, all demonstrated unchanged intraparenchymal hemorrhage and intraventricular hemorrhage ?12/3, MRI brain from also showed numerous small acute infarcts in both cerebral hemisphere with minimal improvement in the right superior cerebellum ?12/3, intubated ?12/13, tracheostomy ?12/15, wean from vent to trach collar ?12/16, PEG tube placement ?12/16, repeat MRI showed an increase in the size and number of acute infarcts in the bilateral hemispheres with increased involvement in the cerebellum bilaterally.   ?[facility]  ?12/21, palliative care consulted.  Family wanted to continue aggressive care as full CODE STATUS. ?12/23 developed Cheyne-Stokes respiratory pattern with respiratory alkalosis.  Transferred to ICU for short-term mechanical ventilation and was found to have new tracheobronchitis now on cefepime ?12/25 back to progressive floor and to TRH ?  ?His hospital course has been complicated by significant neurological impairment, aspiration events, AKI, hypernatremia, urinary retention. ?1/9 trach changed to #6.0 Shiley cuffless--1/10 > 30 days since trach inserted. ?  ?  ? ? ?Assessment & Plan: ?   ?Active Problems: ?  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  Malignant hypertension ?  Diabetes mellitus type 2 in obese [facility](HCC) ?  Acute kidney injury on CKD 3b/acute azotemia with mild hypernatremia ?  hypernatremia ?  Anemia ?  Acute hypokalemia ?  Encounter for end of life care ?  Fever 2/2 Enterobacter UTI ?  Transaminitis ?  Protein-calorie malnutrition/dysphagia due to recent stroke/constipation ?  Constipation ?  Protein-calorie malnutrition, severe ?  Hypernatremia ?  Diarrhea ?  Pressure injury of skin ?  Acute conjunctivitis, bilateral ?  Fever in adult ?  Infection due to Enterobacter cloacae ?  UTI (urinary tract infection) with pyuria ?  Ileus with hypokalemia ?  Metabolic acidosis ?  Hypophosphatemia ?  Vomiting in adult ? 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  ?End-of-life discussion ?Palliative/ethics involved ? ?   Hypernatremia with metabolic acidosis/hypokalemia/mild acute kidney injury ?Improving, will increase free water via PEG to every 4 hours. ?  ?Microcytic anemia ?Transfuse as needed, no evidence of active bleed currently. ?  ?Recent Enterobacter UTI ?Resolved ?  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  ?Malignant hypertension ?Blood pressure has progressively become more difficult to control over the past 1-1/2 to 2-weeks ?Current  regimen as follows: ?Norvasc  10 mg daily ?Carvedilol 12.5 mg twice daily ?Catapres patch 0.2 mg weekly ?Isordil 20 mg 3 times daily ?Cozaar 50 mg daily ?  ?Protein-calorie malnutrition/dysphagia due to recent stroke/constipation ?Nutrition Problem: Inadequate oral intake ?Etiology: lethargy/confusion, dysphagia ?Signs/Symptoms: NPO status ?Interventions: Tube feeding via PEG Osmolite at a rate of 60 cc/h ?Body mass index is 23.33 kg/m?.  ?  ?Diabetes mellitus type 2 in obese [facility]) ?A1c 6.3 in 12/31/2020 ?-CBG controlled, did not require any insulin recently, will discontinue insulin sliding scale. ? ?Acute conjunctivitis, bilateral ?Resolved continues to have some left eye injection likely due to lack of blink reflex so nurse instructed to begin saline drops ?  ?Pneumonia due to Klebsiella pneumoniae  ?Resolved ?  ?Acute urinary retention ?-Continue Urecholine ?-Foley catheter discontinued on 1/11 and patient voiding easily ?  ?Transaminitis ?- Reemergence of mild nonobstructive transaminitis with ALT currently elevated at 92-May be related to acute infectious process-unable to give per tube medications so Zyprexa on hold ?-Continue to follow lab ?  ?MSSA (methicillin susceptible Staphylococcus aureus) pneumonia ?Resolved ?  ?Pressure injury of skin ?Resolved-prior anus ulcer ?  ?Pneumonia due to Serratia marcescens ?Resolved ?  ?Acute kidney injury on CKD 3b/acute azotemia with mild hypernatremia ?BUN elevated at 55 with normal creatinine of 1.2 and a GFR greater than 60 ?  ?Acute otitis media, right with mastoid effusion-resolved as of 04/30/2021 ?Patient with recent bilateral conjunctivitis that persisted over 2 weeks despite pharmacological therapy ?Maxillofacial CT revealed new right middle ear and mastoid opacification consistent with otitis media with mastoid effusion ?Will need follow-up CT to evaluate for resolution.  If not resolved consideration should be given to ENT evaluation to determine if a candidate  for myringotomy tube --Right TM unremarkable and without signs of infection when examined.  ?  ?  ? ? ? ? ?DVT prophylaxis:  Heparin ?Code Status: Full ?Family Communication: none at bedside ?Disposition:  ? ?Status is: Inpatient ?Remains inpatient appropriate  ?  ?Consultants:  ?Neurology ?PCCM ?Palliative medicine ? ? ? ?Subjective: ? ?Technique and events overnight as discussed with staff ? ?Objective: ?Vitals:  ? 05/20/21 0352 05/20/21 0811 05/20/21 0845 05/20/21 1226  ?BP:  (!) 153/99    ?Pulse: 90 91    ?Resp: 16 18    ?Temp:  98.7 ?F (37.1 ?C)    ?TempSrc:      ?SpO2: 100% 99% 100% 100%  ?Weight:      ?Height:      ? ? ?Intake/Output Summary (Last 24 hours) at 05/20/2021 1320 ?Last data filed at 05/20/2021 0605 ?Gross per 24 hour  ?Intake 1120 ml  ?Output 450 ml  ?Net 670 ml  ? ?Filed Weights  ? 04/13/21 1700 04/28/21 0500 05/19/21 0423  ?Weight: 108 kg 111.1 kg 115.2 kg  ? ? ?Examination: ? ?Frail, chronically appearing male, laying in bed, no apparent distress, unresponsive does not follow any commands. ?Lurline Idol present with no significant secretions ?Symmetrical Chest wall movement, Good air movement bilaterally, ?RRR,No Gallops,Rubs or new Murmurs, No Parasternal Heave ?+ve B.Sounds, Abd Soft, No tenderness, PEG+ ?No Cyanosis, Clubbing or edema, No new Rash or bruise   ? ? ? ? ?Data Reviewed: I have personally reviewed following labs and imaging studies ? ?CBC: ?Recent Labs  ?Lab 05/14/21 ?[id number] 05/15/21 ?[id number] 05/18/21 ?0138 05/18/21 ?[id number]  ?WBC 11.4* 9.6 8.5 7.8  ?NEUTROABS 8.6* 7.0 5.9  --   ?HGB 7.4* 8.4* 7.7* 8.3*  ?HCT 25.1* 27.3* 25.8* 27.8*  ?MCV 82.0 79.8* 80.6 81.0  ?PLT  274 268 245 263  ? ? ?Basic Metabolic Panel: ?Recent Labs  ?Lab 05/14/21 ?[id number] 05/15/21 ?0734 05/17/21 ?1813 05/18/21 ?[id number]  ?NA 153* 152* 148* 147*  ?K 3.9 3.6 3.7 3.7  ?CL 128* 127* 119* 117*  ?CO2 19* 21* 21* 21*  ?GLUCOSE 150* 190* 150* 166*  ?BUN 29* 29* 28* 27*  ?CREATININE 1.08 1.12 1.01 0.99  ?CALCIUM 9.0 8.9 8.9 9.0   ? ? ?GFR: ?Estimated Creatinine Clearance: 107.8 mL/min (by C-G formula based on SCr of 0.99 mg/dL). ? ?Liver Function Tests: ?Recent Labs  ?Lab 05/17/21 ?1813  ?AST 32  ?ALT 50*  ?ALKPHOS 54  ?BILITOT 0.5  ?PROT 6.

## 2021-05-20 NOTE — Progress Notes (Signed)
Trach education and trach cleaning done with wife Stephen Delgado and [name] this afternoon.  Showed them both how to suction, change the inner cannula, clean around trach site and change the trach dressing.  Stephen Delgado and Stephen Delgado both had a chance to do hands on with changing the inner cannula and replacing the trach dressing.  Stephen Delgado took good note in the trach booklet that was given to them.  We discussed sterile technique.  How to put on sterile gloves.  How to  clean an inner cannula. The importance of using sterile water or saline for trach care. How to set up area in home for trach supplies.  The importance the obturator and back up trach.  They were given homework task of practicing and being ready ask questions today.  We plan to continue with education and training today.  Will be teaching and practicing suctioning and change trach ties today.   ?

## 2021-05-20 NOTE — Progress Notes (Signed)
Nutrition Follow-up ? ?DOCUMENTATION CODES:  ?Severe malnutrition in context of chronic illness ? ?INTERVENTION:  ?Continue TF via PEG: ?-Osmolite 1.5 @ goal rate of 46m/hr ?-41mProsource TF BID (substitutions are allowed) ?-20052mree water Q4H  ? ?At goal, TF regimen will provide 2240 kcals, 112 grams protein, 2297m25mee water  ? ?NUTRITION DIAGNOSIS:  ?Severe Malnutrition related to chronic illness as evidenced by severe muscle depletion, severe fat depletion. -- ongoing ? ?GOAL:  ?Patient will meet greater than or equal to 90% of their needs -- addressing with TF, met when TF at goal ? ?MONITOR:  ?TF tolerance, Labs, I & O's, Skin ? ?REASON FOR ASSESSMENT:  ?Ventilator, Consult ?Enteral/tube feeding initiation and management ? ?ASSESSMENT:  ?55 y41r old male who presented to the ED on 11/29 with AMS. PMH of HTN, T2DM, CKD stage III. 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 ? ?11/30 cortrak placed; tip gastric ?12/2 trickle TF ?12/3 pt intubated due to increased WOB, +emesis, TF held ?12/4 trickle TF restarted ?12/5 advancement orders placed ?12/9 TF adjusted ?12/13 s/p tracheostomy ?12/16 s/p EGD and PEG placement ?12/19 [facility] of ICU [facility] developed Cheyne-Stokes respiratory pattern with respiratory alkalosis.Transferred to ICU for short-term mechanical ventilation and was found to have new tracheobronchitis now on cefepime ?12/25 tx back to [facility] [place]09 trach changed to #6 shiley cuffless  ?1/10 marks 30 days since trach placed ?2/17 trach changed   ?3/03 trach changed  ?3/04 SBO resolved ?4/14 trach changed ? ?Pt remains dependent on trach/PEG. Also remains nonverbal and is a total assist. PMT following. Per CM/SW, pt to discharge home with home health this week.  ? ?Current TF orders: Osmolite 1.5 @ 60ml26mw/ 45ml 69mource TF BID and 200ml f56mwater Q4H ? ?TF currently running at goal rate of 60ml/hr43m  ?UOP: 900ml x2424mrs ?I/O:  +27.4L since admit ? ?Admit wt 85.2 kg ?Current wt 115.2 kg (suspect weight is inaccurate) ?Per RN edema assessment, pt with non-pitting edema to BLE and BUE ?  ?Medications: reglan, protonix ?Labs: Na 147, hgb 8.3 ?CBGs: 135-154 x 24 hours ? ?Diet Order:   ?Diet Order   ? ?       ?  Diet NPO time specified  Diet effective midnight       ?  ? ?  ?  ? ?  ? ?EDUCATION NEEDS:  ?Not appropriate for education at this time ? ?Skin:  Skin Assessment: Reviewed RN Assessment ? ?Last BM:  4/18 ? ?Height:  ?Ht Readings from Last 1 Encounters:  ?01/23/21 6' (1.829 m)  ? ?Weight:  ?Wt Readings from Last 1 Encounters:  ?05/19/21 115.2 kg  ? ?BMI:  Body mass index is 34.45 kg/m?. ? ?Estimated Nutritional Needs:  ?Kcal:  2150-2350 ?Protein:  105-120 grams ?Fluid:  >2L ? ? ?Stephen Delgado A.[name], LDN (she/her/hers) ?RD pager number and weekend/on-call pager number located in Amion. ? [place]

## 2021-05-21 ENCOUNTER — Other Ambulatory Visit (HOSPITAL_COMMUNITY): Payer: Self-pay

## 2021-05-21 LAB — GLUCOSE, CAPILLARY
Glucose-Capillary: 148 mg/dL — ABNORMAL HIGH (ref 70–99)
Glucose-Capillary: 173 mg/dL — ABNORMAL HIGH (ref 70–99)
Glucose-Capillary: 156 mg/dL — ABNORMAL HIGH (ref 70–99)
Glucose-Capillary: 161 mg/dL — ABNORMAL HIGH (ref 70–99)
Glucose-Capillary: 153 mg/dL — ABNORMAL HIGH (ref 70–99)
Glucose-Capillary: 182 mg/dL — ABNORMAL HIGH (ref 70–99)

## 2021-05-21 MED ORDER — ISOSORBIDE DINITRATE 20 MG PO TABS
20.0000 mg | ORAL_TABLET | Freq: Three times a day (TID) | ORAL | 0 refills | Status: DC
Start: 2021-05-21 — End: 2021-06-16
  Filled 2021-05-21: qty 60, 20d supply, fill #0

## 2021-05-21 MED ORDER — METHOCARBAMOL 500 MG PO TABS
500.0000 mg | ORAL_TABLET | Freq: Three times a day (TID) | ORAL | 0 refills | Status: DC
  Filled 2021-05-21: qty 90, 30d supply, fill #0

## 2021-05-21 MED ORDER — CHLORHEXIDINE GLUCONATE 0.12 % MT SOLN
15.0000 mL | Freq: Two times a day (BID) | OROMUCOSAL | 0 refills | Status: DC
  Filled 2021-05-21: qty 473, 16d supply, fill #0

## 2021-05-21 MED ORDER — PROSOURCE TF PO LIQD
45.0000 mL | Freq: Two times a day (BID) | ORAL | 0 refills | Status: AC
  Filled 2021-05-21: qty 3000, 33d supply, fill #0

## 2021-05-21 MED ORDER — ORAL CARE MOUTH RINSE
15.0000 mL | Freq: Two times a day (BID) | OROMUCOSAL | 0 refills | Status: DC
  Filled 2021-05-21: qty 710, 24d supply, fill #0

## 2021-05-21 MED ORDER — CARVEDILOL 12.5 MG PO TABS
12.5000 mg | ORAL_TABLET | Freq: Two times a day (BID) | ORAL | 0 refills | Status: DC
  Filled 2021-05-21: qty 60, 30d supply, fill #0

## 2021-05-21 MED ORDER — CLONIDINE 0.2 MG/24HR TD PTWK
0.2000 mg | MEDICATED_PATCH | TRANSDERMAL | 0 refills | Status: DC
  Filled 2021-05-21: qty 4, 28d supply, fill #0

## 2021-05-21 MED ORDER — AMLODIPINE BESYLATE 10 MG PO TABS
10.0000 mg | ORAL_TABLET | Freq: Every day | ORAL | 0 refills | Status: DC
  Filled 2021-05-21: qty 30, 30d supply, fill #0

## 2021-05-21 MED ORDER — ACETAMINOPHEN 325 MG PO TABS: 650.0000 mg | ORAL_TABLET | Freq: Four times a day (QID) | ORAL | Status: DC

## 2021-05-21 MED ORDER — ALBUTEROL SULFATE (2.5 MG/3ML) 0.083% IN NEBU
2.5000 mg | INHALATION_SOLUTION | RESPIRATORY_TRACT | 12 refills | Status: DC | PRN
  Filled 2021-05-21: qty 75, 5d supply, fill #0
  Filled 2021-06-16: qty 75, 5d supply, fill #1
  Filled 2022-04-15: qty 75, 6d supply, fill #0

## 2021-05-21 MED ORDER — LOSARTAN POTASSIUM 50 MG PO TABS
50.0000 mg | ORAL_TABLET | Freq: Every day | ORAL | 0 refills | Status: DC
  Filled 2021-05-21: qty 30, 30d supply, fill #0

## 2021-05-21 MED ORDER — FREE WATER
200.0000 mL | Status: DC
Start: 2021-05-21 — End: 2022-12-29

## 2021-05-21 NOTE — Progress Notes (Signed)
Patient seen today by trach team for consult  All necessary equipment is at beside. Wife and son has had several education and training sessions by RT staff on trach care and cleaning.  Trach consult team left information about trach clinic at  beside with supplies for home.  I also discussed trach clinic with wife during my education sessions with her.  She will need to call us at [phone] as soon as she get  home to set up appointment time and date. Would like to see patient in the trach clinic in the next 2 weeks. ?

## 2021-05-21 NOTE — Progress Notes (Signed)
ATC setup changed 

## 2021-05-21 NOTE — Progress Notes (Addendum)
1pm: ?CSW spoke with Evalyn Casco to confirm that nursing staff will be at the patient's home tomorrow at 1pm following his 12pm discharge.  ? ?CSW spoke with Ian Malkin of [employer] to have a lift and table top pulse oximeter ordered per [facility] request. CSW notified MD to update DME orders. ? ?9am: ?CSW spoke with Ian Malkin of [employer] who states the tube feeding pump and supplies were delivered to the patient's room last night. Ian Malkin states the suctioning equipment will be delivered to the room today and the equipment to the patient's home today as well. Ian Malkin will attempt to meet with patient's wife in the room today during the training session. ? ?CSW requested MD send patient's prescriptions to [facility] pharmacy to be filled before discharge tomorrow. ? ?[name], MSW, LCSW ?Transitions of Care  Clinical Social Worker II ?[phone] ? ?

## 2021-05-21 NOTE — Progress Notes (Signed)
?PROGRESS NOTE ? ? ? Stephen Delgado  [id number] DOB: [birth year] DOA: 12/30/2020 ?PCP: Patient, [name]  ? ? ? ?Chief Complaint  ?Patient presents with  ? Altered Mental Status  ? ? ?Brief Narrative: ? ?56 y.o. male with PMH significant for DM2, HTN, CKD with questionable compliance to medications ?Patient presented to the ED on 12/30/2020 with complaint of headache, altered mental status ?  ?In the ED, he was hypertensive to 238/160, agitated, required restraints ?CT head showed large amount of intraventricular hemorrhage involving lateral/third/fourth ventricle, ICH and left basal ganglia as well as chronic microvascular ischemic changes. ?He was started on Cleviprex drip and admitted to neuro ICU ?  ?He subsequently had CT head repeated on 11/30, 12/1 and 12/3, all demonstrated unchanged intraparenchymal hemorrhage and intraventricular hemorrhage ?12/3, MRI brain from also showed numerous small acute infarcts in both cerebral hemisphere with minimal improvement in the right superior cerebellum ?12/3, intubated ?12/13, tracheostomy ?12/15, wean from vent to trach collar ?12/16, PEG tube placement ?12/16, repeat MRI showed an increase in the size and number of acute infarcts in the bilateral hemispheres with increased involvement in the cerebellum bilaterally.   ?12/18, transferred from neuro service to [facility]  ?12/21, palliative care consulted.  Family wanted to continue aggressive care as full CODE STATUS. ?12/23 developed Cheyne-Stokes respiratory pattern with respiratory alkalosis.  Transferred to ICU for short-term mechanical ventilation and was found to have new tracheobronchitis now on cefepime ?12/25 back to progressive floor and to [facility] ?  ?His hospital course has been complicated by significant neurological impairment, aspiration events, AKI, hypernatremia, urinary retention. ?1/9 trach changed to #6.0 Shiley cuffless ?  ?  ? ? ?Assessment & Plan: ?  ?Active Problems: ?  Brain injury 2/2  acute intraparenchymal and intraventricular hemorrhage/Bilateral multifocal ischemic infarcts with assoc muscle hypertonicity  ?  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  Malignant hypertension ?  Diabetes mellitus type 2 in obese [facility]) ?  Acute kidney injury on CKD 3b/acute azotemia with mild hypernatremia ?  hypernatremia ?  Anemia ?  Acute hypokalemia ?  Encounter for end of life care ?  Fever 2/2 Enterobacter UTI ?  Transaminitis ?  Protein-calorie malnutrition/dysphagia due to recent stroke/constipation ?  Constipation ?  Protein-calorie malnutrition, severe ?  Hypernatremia ?  Diarrhea ?  Pressure injury of skin ?  Acute conjunctivitis, bilateral ?  Fever in adult ?  Infection due to Enterobacter cloacae ?  UTI (urinary tract infection) with pyuria ?  Ileus with hypokalemia ?  Metabolic acidosis ?  Hypophosphatemia ?  Vomiting in adult ? 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  ?End-of-life discussion ?Palliative/ethics involved ?-Remains full code ? ?   Hypernatremia with metabolic acidosis/hypokalemia/mild acute kidney injury ?Improving, free water has been increased to every 4 hours via PEG. ?  ?Microcytic anemia ?Transfuse as needed, no evidence of active bleed currently. ?  ?Recent Enterobacter UTI ?Resolved ?  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  ?Malignant hypertension ?Blood pressure has progressively become more difficult to control over the past 1-1/2 to 2-weeks ?Current regimen as  follows: ?Norvasc 10 mg  daily ?Carvedilol 12.5 mg twice daily ?Catapres patch 0.2 mg weekly ?Isordil 20 mg 3 times daily ?Cozaar 50 mg daily ?  ?Protein-calorie malnutrition/dysphagia due to recent stroke/constipation ?Nutrition Problem: Inadequate oral intake ?Etiology: lethargy/confusion, dysphagia ?Signs/Symptoms: NPO status ?Interventions: Tube feeding via PEG Osmolite at a rate of 60 cc/h ?Body mass index is 23.33 kg/m?.  ?  ?Diabetes mellitus type 2 in obese [facility]) ?A1c 6.3 in 12/31/2020 ?-CBG controlled, no insulin requirement, insulin sliding scale has been discontinued ? ?Acute conjunctivitis, bilateral ?Resolved ?  ?Pneumonia due to Klebsiella pneumoniae  ?Resolved ?  ?Acute urinary retention ?-Continue Urecholine ?-Foley catheter discontinued on 1/11 and patient voiding easily ?  ?Transaminitis ?- Reemergence of mild nonobstructive transaminitis with ALT currently elevated at 92-May be related to acute infectious process-unable to give per tube medications so Zyprexa on hold ?-Continue to follow lab ?  ?MSSA (methicillin susceptible Staphylococcus aureus) pneumonia ?Resolved ?  ?Pressure injury of skin ?Resolved-prior anus ulcer ?  ?Pneumonia due to Serratia marcescens ?Resolved ?  ?Acute kidney injury on CKD 3b/acute azotemia with mild hypernatremia ?BUN elevated at 55 with normal creatinine of 1.2 and a GFR greater than 60 ?  ?Acute otitis media, right with mastoid effusion-resolved as of 04/30/2021 ? ?  ? ? ? ? ?DVT prophylaxis:  Heparin ?Code Status: Full ?Family Communication: none at bedside ?Disposition:  ? ?Status is: Inpatient ?Remains inpatient appropriate  ?  ?Consultants:  ?Neurology ?PCCM ?Palliative medicine ? ? ? ?Subjective: ? ?Technique and events overnight as discussed with staff ? ?Objective: ?Vitals:  ? 05/21/21 0340 05/21/21 0432 05/21/21 0831 05/21/21 1208  ?BP:  (!) 153/97 (!) 166/106 (!) 159/85  ?Pulse: 91 96 93 92  ?Resp: 18 16 17 16   ?Temp:  99.1 ?F (37.3 ?C) 99 ?F (37.2 ?C) 99.1  ?F (37.3 ?C)  ?TempSrc:  Oral Axillary Axillary  ?SpO2: 95% 100% 94% 97%  ?Weight:      ?Height:      ? ? ?Intake/Output Summary (Last 24 hours) at 05/21/2021 1241 ?Last data filed at 05/21/2021 [id number] ?Gross per 24 hour  ?Intake 1400 ml  ?Output 1300 ml  ?Net 100 ml  ? ? ?Filed Weights  ? 04/13/21 1700 04/28/21 0500 05/19/21 0423  ?Weight: 108 kg 111.1 kg 115.2 kg  ? ? ?Examination: ? ?Frail, chronically appearing male, laying in bed, no apparent distress, unresponsive does not follow any commands. ?Lurline Idol present with no significant secretions ?Symmetrical Chest wall movement, Good air movement bilaterally, CTAB ?RRR,No Gallops,Rubs or new Murmurs, No Parasternal Heave ?+ve B.Sounds, Abd Soft, PEG + ?No Cyanosis, Clubbing or edema, No new Rash or bruise   ? ? ? ? ? ?Data Reviewed: I have personally reviewed following labs and imaging studies ? ?CBC: ?Recent Labs  ?Lab 05/15/21 ?0734 05/18/21 ?0138 05/18/21 ?0806  ?WBC 9.6 8.5 7.8  ?NEUTROABS 7.0 5.9  --   ?HGB 8.4* 7.7* 8.3*  ?HCT 27.3* 25.8* 27.8*  ?MCV 79.8* 80.6 81.0  ?PLT 268 245 263  ? ? ? ?Basic Metabolic Panel: ?Recent Labs  ?Lab 05/15/21 ?0734 05/17/21 ?1813 05/18/21 ?[id number]  ?NA 152* 148* 147*  ?K 3.6 3.7 3.7  ?CL 127* 119* 117*  ?CO2 21* 21* 21*  ?GLUCOSE 190* 150* 166*  ?BUN 29* 28* 27*  ?CREATININE 1.12 1.01 0.99  ?CALCIUM 8.9 8.9 9.0  ? ? ? ?GFR: ?Estimated Creatinine Clearance: 107.8 mL/min (by C-G formula based on SCr of 0.99 mg/dL). ? ?Liver Function Tests: ?Recent Labs  ?Lab 05/17/21 ?1813  ?AST  32  ?ALT 50*  ?ALKPHOS 54  ?BILITOT 0.5  ?PROT 6.5  ?ALBUMIN 2.2*  ? ? ? ?CBG: ?Recent Labs  ?Lab 05/20/21 ?1952 05/20/21 ?2338 05/21/21 ?[id number] 05/21/21 ?[id number] 05/21/21 ?1206  ?GLUCAP 154* 133* 148* 173* 156*  ? ? ? ? ?No results found for this or any previous visit (from the past 240 hour(s)).  ? ? ? ? ? ?Radiology Studies: ?No results found. ? ? ? ? ? ?Scheduled Meds: ? acetaminophen  650 mg Per Tube Q6H  ? amLODipine  10 mg Per Tube Daily  ? carvedilol  12.5 mg  Per Tube BID WC  ? chlorhexidine  15 mL Mouth Rinse BID  ? cloNIDine  0.2 mg Transdermal Weekly  ? feeding supplement (PROSource TF)  45 mL Per Tube BID  ? free water  200 mL Per Tube Q4H  ? heparin  5,000 Units

## 2021-05-22 ENCOUNTER — Other Ambulatory Visit (HOSPITAL_COMMUNITY): Payer: Self-pay

## 2021-05-22 LAB — GLUCOSE, CAPILLARY
Glucose-Capillary: 177 mg/dL — ABNORMAL HIGH (ref 70–99)
Glucose-Capillary: 182 mg/dL — ABNORMAL HIGH (ref 70–99)
Glucose-Capillary: 172 mg/dL — ABNORMAL HIGH (ref 70–99)

## 2021-05-22 MED ORDER — OSMOLITE 1.5 CAL PO LIQD: 1000.0000 mL | ORAL | 0 refills | Status: DC

## 2021-05-22 NOTE — Progress Notes (Signed)
SATURATION QUALIFICATIONS: (This note is used to comply with regulatory documentation for home oxygen) ? ?Please briefly explain why patient needs home oxygen: ? ?Patient is not ambulatory and requires a trach mask at 5L (21% O2) at all times. ?

## 2021-05-22 NOTE — Progress Notes (Addendum)
CSW spoke with patient's wife who confirms the patient's DME has been delivered.  ? ?CSW informed Stephen Delgado of [employer] of patient's oxygen requirement. Patient is on 5L through his trach collar. ? ?CSW spoke with Stephen Batten, RN to request he document the patient's need for oxygen at home. ? ?CSW spoke with Dr. Randol Delgado to inform him of discharge plan. ? ?CSW delivered patient's medications from [facility] pharmacy to bedside. ? ?Discharge paperwork delivered to nurse secretary. ? ?Patient will discharge home via [facility] at 12pm today. ? ?Stephen Delgado, MSW, LCSW ?Transitions of Care  Clinical Social Worker II ?[phone] ? ?

## 2021-05-22 NOTE — Progress Notes (Signed)
Pt. Dressed in the clothes the family brought, PIV removed, feeding tube flushed with water and clamped.  Brief placed on patient for transport.  [facility] given 2 bags, 1 containing his medications, 1 containing trach supplies, including 2 replacement trachs of the same size and 1 a size smaller, a resuscitation bag with trach adapter. ?

## 2021-05-22 NOTE — Progress Notes (Signed)
SATURATION QUALIFICATIONS: (This note is used to comply with regulatory documentation for home oxygen) ? ?Patient Saturations on Room Air at Rest = 95% ? ?Patient Saturations on Room Air while Ambulating = UTA% ? ?Patient Saturations on 5 Liters of oxygen while Ambulating = UTA% ? ?Please briefly explain why patient needs home oxygen: ? ?Pt. Not ambulatory.  Pt. Saturation only drops to 95% on room air but he uses trach collar for humidification. ?

## 2021-05-22 NOTE — Discharge Summary (Signed)
Physician Discharge Summary  ?Stephen Delgado [id number] DOB: 26-Dec-1965 DOA: 12/30/2020 ? ?PCP: Patient, [name] ? ?Admit date: 12/30/2020 ?Discharge date: 05/22/2021 ? ?Admitted From: Home ?Disposition:  Home with maximum home health  ? ?Recommendations for Outpatient Follow-up:  ?Follow up with PCP, appointment scheduled home visit on 4/25 ?Continue with trach care, ?Patient is strictly n.p.o., all feeding and medication via PEG tube only ? ?Discharge Condition:Stable ?CODE STATUS:FULL ?Diet recommendation: Patient is n.p.o., all meds and feeding via PEG tube, he is on continuous tube feed. ? ?Brief/Interim Summary: ? ? 56 y.o. male with PMH significant for DM2, HTN, CKD with questionable compliance to medications ?Patient presented to the ED on 12/30/2020 with complaint of headache, altered mental status ?  ?In the ED, he was hypertensive to 238/160, agitated, required restraints ?CT head showed large amount of intraventricular hemorrhage involving lateral/third/fourth ventricle, ICH and left basal ganglia as well as chronic microvascular ischemic changes. ?He was started on Cleviprex drip and admitted to neuro ICU ?  ?He subsequently had CT head repeated on 11/30, 12/1 and 12/3, all demonstrated unchanged intraparenchymal hemorrhage and intraventricular hemorrhage ?12/3, MRI brain from also showed numerous small acute infarcts in both cerebral hemisphere with minimal improvement in the right superior cerebellum ?12/3, intubated ?12/13, tracheostomy ?12/15, wean from vent to trach collar ?12/16, PEG tube placement ?12/16, repeat MRI showed an increase in the size and number of acute infarcts in the bilateral hemispheres with increased involvement in the cerebellum bilaterally.   ?12/18, transferred from neuro service to [facility]  ?12/21, palliative care consulted.  Family wanted to continue aggressive care as full CODE STATUS. ?12/23 developed Cheyne-Stokes respiratory pattern with respiratory  alkalosis.  Transferred to ICU for short-term mechanical ventilation and was found to have new tracheobronchitis now on cefepime ?12/25 back to progressive floor and to TRH ?  ?His hospital course has been complicated by significant neurological impairment, aspiration events, AKI, hypernatremia, urinary retention. ?1/9 trach changed to #6.0 Shiley cuffless ?  ? 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  ?End-of-life discussion ?Palliative/ethics were consulted, and they were heavily involved during hospital stay with multiple discussions. ?-Remains full code ?  ?   Hypernatremia with metabolic acidosis/hypokalemia/mild acute kidney injury ?Improving, free water has been increased to every 4 hours via PEG.  Continue with free water 200 cc every 4 hours via PEG. ?  ?Microcytic anemia ?Transfuse as needed, no evidence of active bleed currently. ?  ? Enterobacter UTI ?Resolved ?  ?Acute respiratory failure w/tracheostomy dependence/recurrent Serratia pneumonia and STAPH ?Continue routine trach care ?Discussed with PCCM, they will arrange for trach neck follow-up. ?Continue with trach care and suctioning, family has been educated by RT ?  ?Malignant hypertension ?Blood pressure was difficult to control initially, but it is controlled on current regimen of : ?Norvasc 10 mg daily ?Carvedilol 12.5 mg twice daily ?Catapres patch 0.2 mg weekly ?Isordil 20 mg 3 times daily ?Cozaar 50 mg daily ?  ?Protein-calorie malnutrition/dysphagia due to recent stroke/constipation ?-He is currently on tube feed. ? ?Diabetes mellitus type 2 in obese [facility]) ?A1c 6.3 in 12/31/2020 ?-CBG controlled, no insulin requirement, insulin sliding scale has been discontinued ?  ?Acute conjunctivitis, bilateral ?Resolved ?   ?Pneumonia due to Klebsiella pneumoniae  ?Resolved ?  ?Acute urinary retention ?-Continue Urecholine ?-Foley catheter discontinued on 1/11 and patient  voiding easily ?  ?Transaminitis ?- Reemergence of mild nonobstructive transaminitis with ALT currently elevated at 92-May be related to acute infectious process-unable to give per tube medications so Zyprexa on hold ?-Continue to follow lab ?  ?MSSA (methicillin susceptible Staphylococcus aureus) pneumonia ?Resolved ?  ?Pressure injury of skin ?Resolved-prior anus ulcer ?  ?Pneumonia due to Serratia marcescens ?Resolved ?  ?Acute kidney injury on CKD 3b/acute azotemia with mild hypernatremia ?BUN elevated at 55 with normal creatinine of 1.2 and a GFR greater than 60 ?  ?Acute otitis media, right with mastoid effusion-resolved as of 04/30/2021 ?  ?  ?  ? ?Discharge Diagnoses:  ?Active Problems: ?  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  Malignant hypertension ?  Diabetes mellitus type 2 in obese [facility]) ?  Acute kidney injury on CKD 3b/acute azotemia with mild hypernatremia ?  hypernatremia ?  Anemia ?  Acute hypokalemia ?  Encounter for end of life care ?  Fever 2/2 Enterobacter UTI ?  Transaminitis ?  Protein-calorie malnutrition/dysphagia due to recent stroke/constipation ?  Constipation ?  Protein-calorie malnutrition, severe ?  Hypernatremia ?  Diarrhea ?  Pressure injury of skin ?  Acute conjunctivitis, bilateral ?  Fever in adult ?  Infection due to Enterobacter cloacae ?  UTI (urinary tract infection) with pyuria ?  Ileus with hypokalemia ?  Metabolic acidosis ?  Hypophosphatemia ?  Vomiting in adult ? ? ? ?Discharge Instructions ? ?Discharge Instructions   ? ? Discharge instructions   Complete by: As directed ?  ? -Continue with trach care ?-Strictly n.p.o., all feeding and  medication via PEG tube  ? Increase activity slowly   Complete by: As directed ?  ? No wound care   Complete by: As directed ?  ? ?  ? ?Allergies as of 05/22/2021   ?No Known Allergies ?  ? ?  ?Medication List  ?  ? ?STOP taking these medications   ? ?aspirin 81 MG EC tablet ?  ?metoprolol succinate 25 MG 24 hr tablet ?Commonly known as: TOPROL-XL ?  ?zolpidem 10 MG tablet ?Commonly known as: AMBIEN ?  ? ?  ? ?TAKE these medications   ? ?acetaminophen 325 MG tablet ?Commonly known as: TYLENOL ?Place 2 tablets (650 mg total) into feeding tube every 6 (six) hours. ?  ?albuterol (2.5 MG/3ML) 0.083% nebulizer solution ?Commonly known as: PROVENTIL ?Take 3 mLs (2.5 mg total) by nebulization every 4 (four) hours as needed for wheezing or shortness of breath. ?  ?amLODipine 10 MG tablet ?Commonly known as: NORVASC ?Place 1 tablet (10 mg total) into feeding tube daily. ?What changed: how to take this ?  ?carvedilol 12.5 MG tablet ?Commonly known as: COREG ?Place 1 tablet (12.5 mg total) into feeding tube 2 (two) times daily with a meal. ?  ?chlorhexidine 0.12 % solution ?Commonly known as: PERIDEX ?Rinse and spit 15 mLs by mouth 2 (two) times daily.  Do not eat or drink for 30 minutes afterward. ?  ?cloNIDine 0.2 mg/24hr patch ?Commonly known as: CATAPRES - Dosed in mg/24 hr ?Place 1 patch (0.2 mg total) onto the skin once a week. ?  ?feeding supplement (PROSource TF) liquid ?Place 45 mLs into feeding tube 2 (two) times daily. ?  ?feeding supplement (OSMOLITE 1.5 CAL) Liqd ?Place 1,000 mLs into feeding tube continuous. ?  ?free water Soln ?Place 200 mLs into feeding  tube every 4 (four) hours. ?  ?isosorbide dinitrate 20 MG tablet ?Commonly known as: ISORDIL ?Place 1 tablet (20 mg total) into feeding tube 3 (three) times daily. ?  ?losartan 50 MG tablet ?Commonly known as: COZAAR ?Place 1 tablet (50 mg total) into feeding tube daily. ?  ?methocarbamol 500 MG tablet ?Commonly known as: Robaxin ?Place 1 tablet (500 mg total)  into feeding tube 3 (three) times daily. ?  ?mouth rinse Liqd solution ?15 mLs by Mouth Rinse route 2 times daily at 12 noon and 4 pm. ?  ? ?  ? ?  ?  ? ? ?  ?Durable Medical Equipment  ?(From admission, onward)  ?  ? ?

## 2021-05-26 ENCOUNTER — Ambulatory Visit: Payer: Commercial Managed Care - HMO | Admitting: Family Medicine

## 2021-05-26 ENCOUNTER — Telehealth: Payer: Self-pay | Admitting: Nurse Practitioner

## 2021-05-26 NOTE — Telephone Encounter (Signed)
Attempted to contact patient to schedule Palliative Consult, no answer - left message with reason for call along with my name and call back number.  

## 2021-05-28 ENCOUNTER — Ambulatory Visit: Payer: Commercial Managed Care - HMO | Admitting: Family Medicine

## 2021-05-28 ENCOUNTER — Telehealth: Payer: Self-pay | Admitting: Nurse Practitioner

## 2021-05-28 NOTE — Telephone Encounter (Signed)
Returned call to patient's wife [name] and discussed the Palliative referral/services with her and all questions were answered and she was in agreement with scheduling visit.  I have scheduled an In-home Consult for 06/09/21 @ 10:30 AM ?

## 2021-05-30 ENCOUNTER — Ambulatory Visit (INDEPENDENT_AMBULATORY_CARE_PROVIDER_SITE_OTHER): Payer: Commercial Managed Care - HMO | Admitting: Family Medicine

## 2021-05-30 VITALS — BP 144/101 | HR 67 | Temp 98.0°F | Resp 14

## 2021-05-30 DIAGNOSIS — Z789 Other specified health status: Secondary | ICD-10-CM

## 2021-05-30 DIAGNOSIS — N183 Chronic kidney disease, stage 3 unspecified: Secondary | ICD-10-CM

## 2021-05-30 DIAGNOSIS — E119 Type 2 diabetes mellitus without complications: Secondary | ICD-10-CM

## 2021-05-30 DIAGNOSIS — E870 Hyperosmolality and hypernatremia: Secondary | ICD-10-CM

## 2021-05-30 DIAGNOSIS — J9611 Chronic respiratory failure with hypoxia: Secondary | ICD-10-CM

## 2021-05-30 DIAGNOSIS — L89152 Pressure ulcer of sacral region, stage 2: Secondary | ICD-10-CM

## 2021-05-30 MED ORDER — BLOOD GLUCOSE MONITOR SYSTEM W/DEVICE KIT
PACK | 0 refills | Status: DC
  Filled 2021-06-02: qty 1, 30d supply, fill #0

## 2021-05-30 MED ORDER — SANTYL 250 UNIT/GM EX OINT
1.0000 "application " | TOPICAL_OINTMENT | Freq: Two times a day (BID) | CUTANEOUS | 0 refills | Status: DC
  Filled 2021-06-02: qty 30, 15d supply, fill #0

## 2021-05-30 NOTE — Progress Notes (Signed)
? ?New Patient Office Visit ? ?Subjective   ? ?Patient ID: Stephen Delgado, male    DOB: 1965/03/19  Age: 56 y.o. MRN: [id number] ? ?CC:  ?Chief Complaint  ?Patient presents with  ? Hospitalization Follow-up  ? ? ?HPI ?Stephen Delgado presents to establish care ?Pt recently home after multi month hospital stay after hemorrhagic stroke. Pt currently home with wife and son. There is outside nursing who comes to home. Pt also has DME company. Pt has trach and is on vent. Family has most of needed home supplies except for supplies list from nursing. Secretions are controlled. No breathing txs recently.  ? ?Pt has new skin breakdown on buttocks. 3 in total. New, appeared in last 2 days per pt wife. No past hx of these kinds of breakdown.  ? ?Wife of pt states he is not a DM prior to going on TFs. She says this is a new dx after Tfs were started. No recent BS checks as they have no glucometer.  ? ?Pt currently on continuous feeds. No issues with residual. Has reg BMs every other day. Continuous urination.  ? ?Outpatient Encounter Medications as of 05/30/2021  ?Medication Sig  ? blood glucose meter kit and supplies KIT Dispense based on patient and insurance preference. Use up to four times daily as directed.  ? collagenase (SANTYL) 250 UNIT/GM ointment Apply 1 application. topically 2 (two) times daily. To  affected area  ? acetaminophen (TYLENOL) 325 MG tablet Place 2 tablets (650 mg total) into feeding tube every 6 (six) hours.  ? albuterol (PROVENTIL) (2.5 MG/3ML) 0.083% nebulizer solution Take 3 mLs (2.5 mg total) by nebulization every 4 (four) hours as needed for wheezing or shortness of breath.  ? amLODipine (NORVASC) 10 MG tablet Place 1 tablet (10 mg total) into feeding tube daily.  ? carvedilol (COREG) 12.5 MG tablet Place 1 tablet (12.5 mg total) into feeding tube 2 (two) times daily with a meal.  ? chlorhexidine (PERIDEX) 0.12 % solution Rinse and spit 15 mLs by mouth 2 (two) times daily.  Do not eat or drink for  30 minutes afterward.  ? cloNIDine (CATAPRES - DOSED IN MG/24 HR) 0.2 mg/24hr patch Place 1 patch (0.2 mg total) onto the skin once a week.  ? isosorbide dinitrate (ISORDIL) 20 MG tablet Place 1 tablet (20 mg total) into feeding tube 3 (three) times daily.  ? losartan (COZAAR) 50 MG tablet Place 1 tablet (50 mg total) into feeding tube daily.  ? methocarbamol (ROBAXIN) 500 MG tablet Place 1 tablet (500 mg total) into feeding tube 3 (three) times daily.  ? Mouthwashes (MOUTH RINSE) LIQD solution 15 mLs by Mouth Rinse route 2 times daily at 12 noon and 4 pm.  ? Nutritional Supplements (FEEDING SUPPLEMENT, OSMOLITE 1.5 CAL,) LIQD Place 1,000 mLs into feeding tube continuous.  ? Nutritional Supplements (FEEDING SUPPLEMENT, PROSOURCE TF,) liquid Place 45 mLs into feeding tube 2 (two) times daily.  ? Water For Irrigation, Sterile (FREE WATER) SOLN Place 200 mLs into feeding tube every 4 (four) hours.  ? ?No facility-administered encounter medications on file as of 05/30/2021.  ? ? ?Past Medical History:  ?Diagnosis Date  ? Acute respiratory failure ([place])   ? a. 12/2020 in setting of IVH/strokes-->s/p trach.  ? Allergy   ? Bilateral Embolic strokes (HCC)   ? a. 01/2021 MRI: numerous sm acute infarcts in bilat cerebral hemispheres, Lt caudate hemorrhage, IVH, scattered sulcal SAH, mild communicating hydrocephalus.  ? CKD (chronic kidney disease),  stage III ([place])   ? Diastolic dysfunction   ? a. 04/2015 Echo: EF 50-55%, mod LVH, no rwma, GrI DD, triv AI, + ASD; b. 12/2020 Echo: EF 50-55%, no rwma, grIDD, nl RV fxn, triv MR, mild AI, Asc Ao 61m.  ? Hypertension   ? [place]ventricular hemorrhage ([place]   ? a. 12/2020 Aucte L basal ganglia intraparenchymal hemorrhage w/ IVH in the setting of hypertensive emergency.  ? Type II diabetes mellitus ([place]   ? ? ?Past Surgical History:  ?Procedure Laterality Date  ? ESOPHAGOGASTRODUODENOSCOPY N/A 01/16/2021  ? Procedure: ESOPHAGOGASTRODUODENOSCOPY (EGD);  Surgeon: TGeorganna Skeans MD;   [place] [facility]  Service: Endoscopy;  Laterality: N/A;  ? NO PAST SURGERIES    ? PEG PLACEMENT N/A 01/16/2021  ? Procedure: PERCUTANEOUS ENDOSCOPIC GASTROSTOMY (PEG) PLACEMENT;  Surgeon: TGeorganna Skeans MD;  [place] [facility]  Service: Endoscopy;  Laterality: N/A;  ? ? ?Family History  ?Problem Relation Age of Onset  ? Stroke Paternal Uncle   ? ? ?Social History  ? ?Socioeconomic History  ? Marital status: Married  ?  Spouse name: Not on file  ? Number of children: Not on file  ? Years of education: 170 ? Highest education level: Not on file  ?Occupational History  ? Not on file  ?Tobacco Use  ? Smoking status: Never  ? Smokeless tobacco: Never  ?Substance and Sexual Activity  ? Alcohol use: Yes  ?  Alcohol/week: 0.0 standard drinks  ?  Comment: Rarely.  ? Drug use: No  ? Sexual activity: Not on file  ?Other Topics Concern  ? Not on file  ?Social History Narrative  ? Not on file  ? ?Social Determinants of Health  ? ?Financial Resource Strain: Not on file  ?Food Insecurity: Not on file  ?Transportation Needs: Not on file  ?Physical Activity: Not on file  ?Stress: Not on file  ?Social Connections: Not on file  ?Intimate Partner Violence: Not on file  ? ? ?Review of Systems  ?Unable to perform ROS: Patient unresponsive  ? ?  ? ? ?Objective   ? ?BP (!) 144/101 (BP Location: Right Arm, Patient Position: Supine, Cuff Size: Normal)   Pulse 67   Temp 98 ?F (36.7 ?C) (Temporal)   Resp 14   SpO2 96%  ? ?Physical Exam ?Vitals reviewed.  ?Constitutional:   ?   General: He is not in acute distress. ?   Appearance: Normal appearance. He is not ill-appearing.  ?HENT:  ?   Head: Normocephalic and atraumatic.  ?   Right Ear: External ear normal.  ?   Left Ear: External ear normal.  ?   Nose: Nose normal. No congestion.  ?   Mouth/Throat:  ?   Mouth: Mucous membranes are moist.  ?   Pharynx: No oropharyngeal exudate.  ?Eyes:  ?   General: No scleral icterus. ?   Pupils: Pupils are equal, round, and reactive to  light.  ?Cardiovascular:  ?   Rate and Rhythm: Normal rate and regular rhythm.  ?   Pulses: Normal pulses.  ?Pulmonary:  ?   Effort: Pulmonary effort is normal.  ?   Breath sounds: Normal breath sounds.  ?Abdominal:  ?   General: Abdomen is flat. Bowel sounds are normal. There is no distension.  ?   Palpations: Abdomen is soft.  ?Skin: ?   Capillary Refill: Capillary refill takes less than 2 seconds.  ?   Comments: Small skin ulcer stage II on buttocks, no signs of  associated cellulitis  ?Neurological:  ?   Mental Status: Mental status is at baseline.  ?   Comments: Unable to assess fully, limited exam, contractures starting to form UEs ?Eyes open spontaneously, no tracking ?PERRL ?Lurline Idol, nonverbal  ? ? ?New labs orderd ?  ? ?Assessment & Plan:  ? ?Problem List Items Addressed This Visit   ? ?  ? Genitourinary  ? Chronic kidney disease (CKD)  ? Relevant Orders  ? Comprehensive Metabolic Panel (CMET)  ? CBC with Differential  ?  ? Other  ? Hypernatremia  ? Relevant Orders  ? Comprehensive Metabolic Panel (CMET)  ? TSH + free T4  ? ?Other Visit Diagnoses   ? ? Chronic respiratory failure with hypoxia (HCC)    -  Primary  ? On tube feeding diet      ? Relevant Orders  ? Comprehensive Metabolic Panel (CMET)  ? Lipid Profile  ? HgB A1c  ? Magnesium  ? Pressure ulcer of sacral region, stage 2 ([place])      ? Relevant Medications  ? collagenase (SANTYL) 250 UNIT/GM ointment  ? Diabetes mellitus without complication ([place])      ? Relevant Orders  ? Comprehensive Metabolic Panel (CMET)  ? TSH + free T4  ? Lipid Profile  ? CBC with Differential  ? HgB A1c  ? Magnesium  ? ?  ? ?F/U 1 mo ? ?Skin ulcer is new, may need wound care from nursing, will reach out to pt nurse; pt advise to rotate pt every 2 4 hours with a wedge ?Will try to source home CXR mobile team to help prevent er visits, discussed family notifying provider team if O2 sat of 92%, use albuterol if O2 is 94%,baseline appears to be 98-96% ? ?DM - nurse to come get  labs, prescribed glucose monitor ? ?Will make contact with pt's DME company to help with orders ? ?Pt getting free water and protein flushes.  ? ?Worked too coordinated care with H/H nurse, phone call ?Worked to [employer]

## 2021-06-01 ENCOUNTER — Encounter (HOSPITAL_COMMUNITY): Payer: Self-pay | Admitting: *Deleted

## 2021-06-01 ENCOUNTER — Telehealth: Payer: Self-pay

## 2021-06-01 ENCOUNTER — Emergency Department (HOSPITAL_COMMUNITY): Payer: Commercial Managed Care - HMO

## 2021-06-01 ENCOUNTER — Emergency Department (HOSPITAL_COMMUNITY)
Admission: EM | Admit: 2021-06-01 | Discharge: 2021-06-02 | Disposition: A | Discharge status: Home / Self Care | Payer: Commercial Managed Care - HMO | Attending: Emergency Medicine | Admitting: Emergency Medicine

## 2021-06-01 ENCOUNTER — Encounter: Payer: Non-veteran care | Admitting: Internal Medicine

## 2021-06-01 ENCOUNTER — Encounter: Payer: Self-pay | Admitting: Family Medicine

## 2021-06-01 ENCOUNTER — Other Ambulatory Visit: Payer: Self-pay

## 2021-06-01 ENCOUNTER — Other Ambulatory Visit (HOSPITAL_COMMUNITY): Payer: Self-pay

## 2021-06-01 DIAGNOSIS — Z79899 Other long term (current) drug therapy: Secondary | ICD-10-CM | POA: Insufficient documentation

## 2021-06-01 DIAGNOSIS — E119 Type 2 diabetes mellitus without complications: Secondary | ICD-10-CM | POA: Diagnosis not present

## 2021-06-01 DIAGNOSIS — I10 Essential (primary) hypertension: Secondary | ICD-10-CM | POA: Diagnosis not present

## 2021-06-01 DIAGNOSIS — K9423 Gastrostomy malfunction: Secondary | ICD-10-CM | POA: Insufficient documentation

## 2021-06-01 MED ORDER — DIATRIZOATE MEGLUMINE & SODIUM 66-10 % PO SOLN
30.0000 mL | Freq: Once | ORAL | Status: AC
  Filled 2021-06-01: qty 30

## 2021-06-01 MED ORDER — DIATRIZOATE MEGLUMINE & SODIUM 66-10 % PO SOLN
ORAL | Status: AC
  Administered 2021-06-01: 30 mL
  Filled 2021-06-01: qty 30

## 2021-06-01 NOTE — Discharge Instructions (Addendum)
Please have the home nursing staff exchange the tube for appropriate tube sizing. Return to the ER for any new or worsening symptoms  ?

## 2021-06-01 NOTE — Progress Notes (Deleted)
New Patient Office Visit  Subjective    Patient ID: Stephen Delgado, male    DOB: [birth year]  Age: 56 y.o. MRN: [id number]  CC: No chief complaint on file.   HPI Stephen Delgado is a 57 year old male with diabetes, hypertension, CKD and recent extended hospitalization from 12/30/2020 - 05/22/2021 for large intracranial hemorrhage.  Hospitalization summary Initially presented to the ED on 12/30/2020 for headache and altered mental status.  He was noted to be hypertensive 238/160.  Head CT reveals large left-sided intraparenchymal hemorrhaging and extensive intraventricular hemorrhage involving the third and fourth ventricles.  Unfortunately he had poor neurologic recovery resulting in tracheostomy and PEG tube placement.  Hospitalization was complicated by MSSA, Klebsiella, and Serratia pneumonias and multifocal bilateral ischemic infarcts with residual muscle hypertonicity.  Palliative care was consulted however family continues to want aggressive care.  -12/3: Acute change in neurologic status.  CTH >>numerous small ischemic infarcts in bilateral hemispheres.  Intubated - 12/13: Tracheostomy - 12/15: Weaned off vent to trach collar - 12/16: PEG tube placement.  Repeat brain MRI with increasing size and number of acute infarcts in bilateral hemispheres - 12/21: - 12/23: Found to have tracheobronchitis and worsening respiratory status requiring transfer back to ICU for mechanical ventilation - 12/25 transferred back out to the floor - 1/9: Tracheostomy change: Shiley #6 cuffless - 4/12: Acute neurologic change>> questionable new infarct in the right frontal lobe although was not confirmed on repeat CT or MRI  Outpatient Encounter Medications as of 06/01/2021  Medication Sig   acetaminophen (TYLENOL) 325 MG tablet Place 2 tablets (650 mg total) into feeding tube every 6 (six) hours.   albuterol (PROVENTIL) (2.5 MG/3ML) 0.083% nebulizer solution Take 3 mLs (2.5 mg total) by nebulization every  4 (four) hours as needed for wheezing or shortness of breath.   amLODipine (NORVASC) 10 MG tablet Place 1 tablet (10 mg total) into feeding tube daily.   blood glucose meter kit and supplies KIT Dispense based on patient and insurance preference. Use up to four times daily as directed.   carvedilol (COREG) 12.5 MG tablet Place 1 tablet (12.5 mg total) into feeding tube 2 (two) times daily with a meal.   chlorhexidine (PERIDEX) 0.12 % solution Rinse and spit 15 mLs by mouth 2 (two) times daily.  Do not eat or drink for 30 minutes afterward.   cloNIDine (CATAPRES - DOSED IN MG/24 HR) 0.2 mg/24hr patch Place 1 patch (0.2 mg total) onto the skin once a week.   collagenase (SANTYL) 250 UNIT/GM ointment Apply 1 application. topically 2 (two) times daily. To  affected area   isosorbide dinitrate (ISORDIL) 20 MG tablet Place 1 tablet (20 mg total) into feeding tube 3 (three) times daily.   losartan (COZAAR) 50 MG tablet Place 1 tablet (50 mg total) into feeding tube daily.   methocarbamol (ROBAXIN) 500 MG tablet Place 1 tablet (500 mg total) into feeding tube 3 (three) times daily.   Mouthwashes (MOUTH RINSE) LIQD solution 15 mLs by Mouth Rinse route 2 times daily at 12 noon and 4 pm.   Nutritional Supplements (FEEDING SUPPLEMENT, OSMOLITE 1.5 CAL,) LIQD Place 1,000 mLs into feeding tube continuous.   Nutritional Supplements (FEEDING SUPPLEMENT, PROSOURCE TF,) liquid Place 45 mLs into feeding tube 2 (two) times daily.   Water For Irrigation, Sterile (FREE WATER) SOLN Place 200 mLs into feeding tube every 4 (four) hours.   No facility-administered encounter medications on file as of 06/01/2021.    Past Medical History:  Diagnosis Date   Acute respiratory failure ([place])    a. 12/2020 in setting of IVH/strokes-->s/p trach.   Allergy    Bilateral Embolic strokes ([place])    a. 01/2021 MRI: numerous sm acute infarcts in bilat cerebral hemispheres, Lt caudate hemorrhage, IVH, scattered sulcal SAH, mild  communicating hydrocephalus.   CKD (chronic kidney disease), stage III (HCC)    Diastolic dysfunction    a. 04/2015 Echo: EF 50-55%, mod LVH, no rwma, GrI DD, triv AI, + ASD; b. 12/2020 Echo: EF 50-55%, no rwma, grIDD, nl RV fxn, triv MR, mild AI, Asc Ao 62m.   Hypertension    [place]    a. 12/2020 Aucte L basal ganglia intraparenchymal hemorrhage w/ IVH in the setting of hypertensive emergency.   Type II diabetes mellitus ([facility]     Past Surgical History:  Procedure Laterality Date   ESOPHAGOGASTRODUODENOSCOPY N/A 01/16/2021   Procedure: ESOPHAGOGASTRODUODENOSCOPY (EGD);  Surgeon: TGeorganna Skeans MD;  [facility]  Service: Endoscopy;  Laterality: N/A;   NO PAST SURGERIES     PEG PLACEMENT N/A 01/16/2021   Procedure: PERCUTANEOUS ENDOSCOPIC GASTROSTOMY (PEG) PLACEMENT;  Surgeon: TGeorganna Skeans MD;  [facility]  Service: Endoscopy;  Laterality: N/A;    Family History  Problem Relation Age of Onset   Stroke Paternal Uncle     Social History   Socioeconomic History   Marital status: Married    Spouse name: Not on file   Number of children: Not on file   Years of education: 16   Highest education level: Not on file  Occupational History   Not on file  Tobacco Use   Smoking status: Never   Smokeless tobacco: Never  Substance and Sexual Activity   Alcohol use: Yes    Alcohol/week: 0.0 standard drinks    Comment: Rarely.   Drug use: No   Sexual activity: Not on file  Other Topics Concern   Not on file  Social History Narrative   Not on file   Social Determinants of Health   Financial Resource Strain: Not on file  Food Insecurity: Not on file  Transportation Needs: Not on file  Physical Activity: Not on file  Stress: Not on file  Social Connections: Not on file  Intimate Partner Violence: Not on file    ROS      Objective    There were no vitals taken for this visit.  Physical Exam  {Labs  (Optional):23779}    Assessment & Plan:   Problem List Items Addressed This Visit   None   Brain injury secondary to acute intraparenchymal and intraventricular hemorrhage Multifocal ischemic infarcts involving the bilateral hemispheres - Trach dependent, nonverbal, noncommunicative, total assist, PEG tube dependent -Scheduled for an in-home consult with palliative care on 5/9  Malignant hypertension   No follow-ups on file.   RMitzi Hansen MD

## 2021-06-01 NOTE — ED Triage Notes (Signed)
Pt from home by EMS; family noticed today that peg tube was missing. Pt is bedbound at home from previous stroke. Trach in place, EMS reports pt is on humidified oxygen. EMS VS 101, cbg 211, 150/90, 95%RA ?

## 2021-06-01 NOTE — ED Provider Notes (Addendum)
?[facility] EMERGENCY DEPARTMENT ?Provider Note ? ? ?CSN: [id number] ?Arrival date & time: 06/01/21  2104 ? ?  ? ?History ? ?Chief Complaint  ?Patient presents with  ? PEG displacement  ? ? ?Stephen Delgado is a 55 y.o. male. ? 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  Son at bedside states the nursing staff was turning him, and about an hour after they left the family noticed that his PEG tube had come out.  Patient has been in his normal state of health otherwise. ?  ? ?Home Medications ?Prior to Admission medications   ?Medication Sig Start Date End Date Taking? Authorizing Provider  ?acetaminophen (TYLENOL) 325 MG tablet Place 2 tablets (650 mg total) into feeding tube every 6 (six) hours. 05/21/21   Elgergawy, Dawood S, MD  ?albuterol (PROVENTIL) (2.5 MG/3ML) 0.083% nebulizer solution Take 3 mLs (2.5 mg total) by nebulization every 4 (four) hours as needed for wheezing or shortness of breath. 05/21/21   Elgergawy, Dawood S, MD  ?amLODipine (NORVASC) 10 MG tablet Place 1 tablet (10 mg total) into feeding tube daily. 05/22/21   Elgergawy, Dawood S, MD  ?blood glucose meter kit and supplies KIT Dispense based on patient and insurance preference. Use up to four times daily as directed. 05/30/21   Hobbs, Phillip M, MD  ?carvedilol (COREG) 12.5 MG tablet Place 1 tablet (12.5 mg total) into feeding tube 2 (two) times daily with a meal. 05/21/21   Elgergawy, Dawood S, MD  ?chlorhexidine (PERIDEX) 0.12 % solution Rinse and spit 15 mLs by mouth 2 (two) times daily.  Do not eat or drink for 30 minutes afterward. 05/21/21 06/20/21  Elgergawy, Dawood S, MD  ?cloNIDine (CATAPRES - DOSED IN MG/24 HR) 0.2 mg/24hr patch Place 1 patch (0.2 mg total) onto the skin once a week. 05/22/21   Elgergawy,  Dawood S, MD  ?collagenase (SANTYL) 250 UNIT/GM ointment Apply 1 application. topically 2 (two) times daily. To  affected area 05/30/21   Hobbs, Phillip M, MD  ?isosorbide dinitrate (ISORDIL) 20 MG tablet Place 1 tablet (20 mg total) into feeding tube 3 (three) times daily. 05/21/21   Elgergawy, Dawood S, MD  ?losartan (COZAAR) 50 MG tablet Place 1 tablet (50 mg total) into feeding tube daily. 05/22/21   Elgergawy, Dawood S, MD  ?methocarbamol (ROBAXIN) 500 MG tablet Place 1 tablet (500 mg total) into feeding tube 3 (three) times daily. 05/21/21   Elgergawy, Dawood S, MD  ?Mouthwashes (MOUTH RINSE) LIQD solution 15 mLs by Mouth Rinse route 2 times daily at 12 noon and 4 pm. 05/21/21   Elgergawy, Dawood S, MD  ?Nutritional Supplements (FEEDING SUPPLEMENT, OSMOLITE 1.5 CAL,) LIQD Place 1,000 mLs into feeding tube continuous. 05/22/21   Elgergawy, Dawood S, MD  ?Nutritional Supplements (FEEDING SUPPLEMENT, PROSOURCE TF,) liquid Place 45 mLs into feeding tube 2 (two) times daily. 05/21/21   Elgergawy, Dawood S, MD  ?Water For Irrigation, Sterile (FREE WATER) SOLN Place 200 mLs into feeding tube every 4 (four) hours. 05/21/21   Elgergawy, Dawood S, MD  ?   ? ?Allergies    ?Patient has no known allergies.   ? ?Review of Systems   ?Review of Systems ?Ten systems reviewed and are negative for acute change, except as noted in the HPI.  ? ? ?Physical Exam ?  Updated Vital Signs ?BP (!) 175/122 (BP Location: Right Arm)   Pulse (!) 104   Temp 99 ?F (37.2 ?C)   Resp 15   SpO2 100%  ?Physical Exam ?Vitals and nursing note reviewed.  ?Constitutional:   ?   General: He is not in acute distress. ?   Appearance: He is well-developed.  ?HENT:  ?   Head: Normocephalic and atraumatic.  ?Eyes:  ?   Conjunctiva/sclera: Conjunctivae normal.  ?Neck:  ?   Comments: Tracheostomy in place, no significant discharge  ?Cardiovascular:  ?   Rate and Rhythm: Normal rate and regular rhythm.  ?   Heart sounds: No murmur heard. ?Pulmonary:  ?   Effort:  Pulmonary effort is normal. No respiratory distress.  ?   Breath sounds: Normal breath sounds.  ?   Comments: No respiratory distress ? ? ?Abdominal:  ?   Palpations: Abdomen is soft.  ?   Tenderness: There is no abdominal tenderness.  ?   Comments: PEG tube stoma w/o surrounding erythema, discharge ?  ?Musculoskeletal:     ?   General: No swelling.  ?   Cervical back: Neck supple.  ?Skin: ?   General: Skin is warm and dry.  ?   Capillary Refill: Capillary refill takes less than 2 seconds.  ?Neurological:  ?   Mental Status: He is alert.  ?Psychiatric:     ?   Mood and Affect: Mood normal.  ? ? ?ED Results / Procedures / Treatments   ?Labs ?(all labs ordered are listed, but only abnormal results are displayed) ?Labs Reviewed - No data to display ? ?EKG ?None ? ?Radiology ?DG ABDOMEN PEG TUBE LOCATION ? ?Result Date: 06/01/2021 ?CLINICAL DATA:  Check PEG catheter placement EXAM: ABDOMEN - 1 VIEW COMPARISON:  None. FINDINGS: Contrast material was injected through an indwelling gastrostomy catheter. Free flow contrast into the stomach and subsequently proximal small bowel is noted. IMPRESSION: Gastrostomy catheter within the stomach. Electronically Signed   By: Inez Catalina M.D.   On: 06/01/2021 22:40   ? ?Procedures ?Gastrostomy tube replacement ? ?Date/Time: 06/01/2021 10:53 PM ?Performed by: Garald Balding, PA-C ?Authorized by: Garald Balding, PA-C  ?Consent: Verbal consent obtained. ?Risks and benefits: risks, benefits and alternatives were discussed ?Consent given by: guardian ?Patient identity confirmed: arm band ?Time out: Immediately prior to procedure a "time out" was called to verify the correct patient, procedure, equipment, support staff and site/side marked as required. ?Preparation: Patient was prepped and draped in the usual sterile fashion. ?Local anesthesia used: no ? ?Anesthesia: ?Local anesthesia used: no ? ?Sedation: ?Patient sedated: no ? ?Patient tolerance: patient tolerated the procedure well with  no immediate complications ? ?  ? ? ?Medications Ordered in ED ?Medications  ?diatrizoate meglumine-sodium (GASTROGRAFIN) 66-10 % solution 30 mL (30 mLs Per Tube Given 06/01/21 2226)  ? ? ?ED Course/ Medical Decision Making/ A&P ?  ?                        ?Medical Decision Making ?Amount and/or Complexity of Data Reviewed ?Radiology: ordered. ? ?Risk ?Prescription drug management. ? ? ?56 year old male w/ PEG tube dislodgement. Family at bedside unaware of size. Per chart review, Dr.  Biagio Borg note does not mention PEG tube size. Placed a 24Fr G tube, confirmed w/ KUB w/ gastrografin. Pt's family at bedside encouraged to have home health nursing staff exchange for appropriate size.  Family requesting abdominal binder, provided. They voice  understanding and are agreeable. No other medical concerns. We discussed return precautions. Stable for discharge ? ?This was a shared visit with my supervising physician Dr. Floyd who independently saw and evaluated the patient & provided guidance in evaluation/management/disposition ,in agreement with care ? ?Final Clinical Impression(s) / ED Diagnoses ?Final diagnoses:  ?PEG tube malfunction (HCC)  ? ? ?Rx / DC Orders ?ED Discharge Orders   ? ? None  ? ?  ? ? ?  ?,  A, PA-C ?06/01/21 2257 ? ?  ?Floyd, Dan, DO ?06/01/21 2300 ? ?  ?,  A, PA-C ?06/01/21 2309 ? ?  ?Floyd, Dan, DO ?06/01/21 2320 ? ?

## 2021-06-01 NOTE — ED Notes (Signed)
Ortho called for abdominal binder. ?

## 2021-06-01 NOTE — Telephone Encounter (Signed)
[facility] TOC per pt's wife, appt 06/08/2021. Pt do not have transportation.  ?

## 2021-06-01 NOTE — Progress Notes (Signed)
Orthopedic Tech Progress Note ?Patient Details:  ?Stephen Delgado ?October 21, 1965 ?[id number] ? ?Ortho Devices ?Type of Ortho Device: Abdominal binder ?Ortho Device/Splint Interventions: Ordered ?  ?  ?Dropped abd binder off with RN. ? ?[place] ?06/01/2021, 11:44 PM ? ?

## 2021-06-02 ENCOUNTER — Other Ambulatory Visit (HOSPITAL_COMMUNITY): Payer: Self-pay

## 2021-06-02 ENCOUNTER — Other Ambulatory Visit: Payer: Commercial Managed Care - HMO

## 2021-06-02 MED ORDER — ONETOUCH DELICA PLUS LANCET33G MISC
0 refills | Status: DC
  Filled 2021-06-02: qty 100, 25d supply, fill #0

## 2021-06-02 MED ORDER — GLUCOSE BLOOD VI STRP
ORAL_STRIP | 0 refills | Status: DC
  Filled 2021-06-02: qty 100, 25d supply, fill #0

## 2021-06-02 NOTE — ED Notes (Signed)
Abdominal binder placed on the pt

## 2021-06-02 NOTE — ED Notes (Signed)
Called [facility] to transport patient ?

## 2021-06-03 ENCOUNTER — Telehealth: Payer: Self-pay | Admitting: Family Medicine

## 2021-06-03 ENCOUNTER — Other Ambulatory Visit (HOSPITAL_COMMUNITY): Payer: Self-pay

## 2021-06-03 DIAGNOSIS — I10 Essential (primary) hypertension: Secondary | ICD-10-CM

## 2021-06-03 LAB — COMPREHENSIVE METABOLIC PANEL
ALT: 32 IU/L (ref 0–44)
AST: 18 IU/L (ref 0–40)
Albumin/Globulin Ratio: 0.9 — ABNORMAL LOW (ref 1.2–2.2)
Albumin: 3.4 g/dL — ABNORMAL LOW (ref 3.8–4.9)
Alkaline Phosphatase: 75 IU/L (ref 44–121)
BUN/Creatinine Ratio: 33 — ABNORMAL HIGH (ref 9–20)
BUN: 30 mg/dL — ABNORMAL HIGH (ref 6–24)
Bilirubin Total: <0.2 mg/dL (ref 0.0–1.2)
CO2: 25 mmol/L (ref 20–29)
Calcium: 9.8 mg/dL (ref 8.7–10.2)
Chloride: 101 mmol/L (ref 96–106)
Creatinine, Ser: 0.9 mg/dL (ref 0.76–1.27)
Globulin, Total: 3.7 g/dL (ref 1.5–4.5)
Glucose: 193 mg/dL — ABNORMAL HIGH (ref 70–99)
Potassium: 4 mmol/L (ref 3.5–5.2)
Sodium: 141 mmol/L (ref 134–144)
Total Protein: 7.1 g/dL (ref 6.0–8.5)
eGFR: 100 mL/min/{1.73_m2} (ref >59)

## 2021-06-03 LAB — MAGNESIUM: Magnesium: 2.1 mg/dL (ref 1.6–2.3)

## 2021-06-03 LAB — CBC WITH DIFFERENTIAL/PLATELET
Basophils Absolute: 0 10*3/uL (ref 0.0–0.2)
Basos: 0 %
EOS (ABSOLUTE): 0.2 10*3/uL (ref 0.0–0.4)
Eos: 2 %
Hematocrit: 32.6 % — ABNORMAL LOW (ref 37.5–51.0)
Hemoglobin: 10.7 g/dL — ABNORMAL LOW (ref 13.0–17.7)
Immature Grans (Abs): 0.1 10*3/uL (ref 0.0–0.1)
Immature Granulocytes: 1 %
Lymphocytes Absolute: 1.9 10*3/uL (ref 0.7–3.1)
Lymphs: 17 %
MCH: 25.2 pg — ABNORMAL LOW (ref 26.6–33.0)
MCHC: 32.8 g/dL (ref 31.5–35.7)
MCV: 77 fL — ABNORMAL LOW (ref 79–97)
Monocytes Absolute: 0.8 10*3/uL (ref 0.1–0.9)
Monocytes: 7 %
Neutrophils Absolute: 8.2 10*3/uL — ABNORMAL HIGH (ref 1.4–7.0)
Neutrophils: 73 %
Platelets: 309 10*3/uL (ref 150–450)
RBC: 4.24 x10E6/uL (ref 4.14–5.80)
RDW: 17.5 % — ABNORMAL HIGH (ref 11.6–15.4)
WBC: 11.3 10*3/uL — ABNORMAL HIGH (ref 3.4–10.8)

## 2021-06-03 LAB — HEMOGLOBIN A1C
Est. average glucose Bld gHb Est-mCnc: 128 mg/dL
Hgb A1c MFr Bld: 6.1 % — ABNORMAL HIGH (ref 4.8–5.6)

## 2021-06-03 LAB — LIPID PANEL
Chol/HDL Ratio: 4.3 ratio (ref 0.0–5.0)
Cholesterol, Total: 146 mg/dL (ref 100–199)
HDL: 34 mg/dL — ABNORMAL LOW (ref >39)
LDL Chol Calc (NIH): 51 mg/dL (ref 0–99)
Triglycerides: 406 mg/dL — ABNORMAL HIGH (ref 0–149)
VLDL Cholesterol Cal: 61 mg/dL — ABNORMAL HIGH (ref 5–40)

## 2021-06-03 LAB — PREALBUMIN: PREALBUMIN: 33 mg/dL (ref 10–36)

## 2021-06-03 LAB — TSH+FREE T4
Free T4: 1.36 ng/dL (ref 0.82–1.77)
TSH: 2.03 u[IU]/mL (ref 0.450–4.500)

## 2021-06-03 MED ORDER — BLOOD PRESSURE MONITORING DEVI
1.0000 "application " | 0 refills | Status: DC | PRN
  Filled 2021-06-03: qty 1, fill #0

## 2021-06-03 MED ORDER — BLOOD PRESSURE MONITORING DEVI: 1.0000 "application " | 1 refills | Status: DC | PRN

## 2021-06-03 NOTE — Telephone Encounter (Signed)
Script sent for BP cuff. ?Nurse and family loosened binder. No further vomiting. Tol feeds now. O2 sats normal no change. Wife states breathing normal. ? ?H/H orders sent back to [facility] company ? ?Haydee Salter, MD ? ?

## 2021-06-08 ENCOUNTER — Encounter: Payer: Non-veteran care | Admitting: Internal Medicine

## 2021-06-08 ENCOUNTER — Emergency Department (HOSPITAL_COMMUNITY): Payer: Commercial Managed Care - HMO

## 2021-06-08 ENCOUNTER — Observation Stay (HOSPITAL_COMMUNITY)
Admission: EM | Admit: 2021-06-08 | Discharge: 2021-06-10 | Disposition: A | Discharge status: Home Health | Payer: Commercial Managed Care - HMO | Attending: Family Medicine | Admitting: Family Medicine

## 2021-06-08 DIAGNOSIS — R9431 Abnormal electrocardiogram [ECG] [EKG]: Secondary | ICD-10-CM | POA: Insufficient documentation

## 2021-06-08 DIAGNOSIS — E1122 Type 2 diabetes mellitus with diabetic chronic kidney disease: Secondary | ICD-10-CM | POA: Diagnosis not present

## 2021-06-08 DIAGNOSIS — D631 Anemia in chronic kidney disease: Secondary | ICD-10-CM | POA: Diagnosis not present

## 2021-06-08 DIAGNOSIS — Z930 Tracheostomy status: Secondary | ICD-10-CM | POA: Diagnosis not present

## 2021-06-08 DIAGNOSIS — Z79899 Other long term (current) drug therapy: Secondary | ICD-10-CM | POA: Diagnosis not present

## 2021-06-08 DIAGNOSIS — T85528A Displacement of other gastrointestinal prosthetic devices, implants and grafts, initial encounter: Secondary | ICD-10-CM

## 2021-06-08 DIAGNOSIS — Z8673 Personal history of transient ischemic attack (TIA), and cerebral infarction without residual deficits: Secondary | ICD-10-CM | POA: Diagnosis not present

## 2021-06-08 DIAGNOSIS — I639 Cerebral infarction, unspecified: Secondary | ICD-10-CM

## 2021-06-08 DIAGNOSIS — I10 Essential (primary) hypertension: Secondary | ICD-10-CM

## 2021-06-08 DIAGNOSIS — E876 Hypokalemia: Secondary | ICD-10-CM | POA: Insufficient documentation

## 2021-06-08 DIAGNOSIS — K9429 Other complications of gastrostomy: Secondary | ICD-10-CM | POA: Diagnosis present

## 2021-06-08 DIAGNOSIS — E1165 Type 2 diabetes mellitus with hyperglycemia: Secondary | ICD-10-CM | POA: Diagnosis not present

## 2021-06-08 DIAGNOSIS — E870 Hyperosmolality and hypernatremia: Secondary | ICD-10-CM | POA: Diagnosis not present

## 2021-06-08 DIAGNOSIS — N1832 Chronic kidney disease, stage 3b: Secondary | ICD-10-CM | POA: Diagnosis not present

## 2021-06-08 DIAGNOSIS — R8281 Pyuria: Secondary | ICD-10-CM | POA: Diagnosis not present

## 2021-06-08 DIAGNOSIS — E43 Unspecified severe protein-calorie malnutrition: Secondary | ICD-10-CM | POA: Insufficient documentation

## 2021-06-08 DIAGNOSIS — Y833 Surgical operation with formation of external stoma as the cause of abnormal reaction of the patient, or of later complication, without mention of misadventure at the time of the procedure: Secondary | ICD-10-CM | POA: Insufficient documentation

## 2021-06-08 DIAGNOSIS — I129 Hypertensive chronic kidney disease with stage 1 through stage 4 chronic kidney disease, or unspecified chronic kidney disease: Secondary | ICD-10-CM | POA: Insufficient documentation

## 2021-06-08 HISTORY — DX: Displacement of other gastrointestinal prosthetic devices, implants and grafts, initial encounter: T85.528A

## 2021-06-08 LAB — BASIC METABOLIC PANEL
Anion gap: 10 (ref 5–15)
BUN: 33 mg/dL — ABNORMAL HIGH (ref 6–20)
CO2: 24 mmol/L (ref 22–32)
Calcium: 9.8 mg/dL (ref 8.9–10.3)
Chloride: 104 mmol/L (ref 98–111)
Creatinine, Ser: 0.95 mg/dL (ref 0.61–1.24)
GFR, Estimated: >60 mL/min (ref >60)
Glucose, Bld: 167 mg/dL — ABNORMAL HIGH (ref 70–99)
Potassium: 3.7 mmol/L (ref 3.5–5.1)
Sodium: 138 mmol/L (ref 135–145)

## 2021-06-08 LAB — CBC WITH DIFFERENTIAL/PLATELET
Abs Immature Granulocytes: 0.04 10*3/uL (ref 0.00–0.07)
Basophils Absolute: 0 10*3/uL (ref 0.0–0.1)
Basophils Relative: 0 %
Eosinophils Absolute: 0.4 10*3/uL (ref 0.0–0.5)
Eosinophils Relative: 3 %
HCT: 35.7 % — ABNORMAL LOW (ref 39.0–52.0)
Hemoglobin: 10.9 g/dL — ABNORMAL LOW (ref 13.0–17.0)
Immature Granulocytes: 0 %
Lymphocytes Relative: 17 %
Lymphs Abs: 2 10*3/uL (ref 0.7–4.0)
MCH: 24.2 pg — ABNORMAL LOW (ref 26.0–34.0)
MCHC: 30.5 g/dL (ref 30.0–36.0)
MCV: 79.2 fL — ABNORMAL LOW (ref 80.0–100.0)
Monocytes Absolute: 0.7 10*3/uL (ref 0.1–1.0)
Monocytes Relative: 6 %
Neutro Abs: 8.6 10*3/uL — ABNORMAL HIGH (ref 1.7–7.7)
Neutrophils Relative %: 74 %
Platelets: 307 10*3/uL (ref 150–400)
RBC: 4.51 MIL/uL (ref 4.22–5.81)
RDW: 17.4 % — ABNORMAL HIGH (ref 11.5–15.5)
WBC: 11.7 10*3/uL — ABNORMAL HIGH (ref 4.0–10.5)
nRBC: 0 % (ref 0.0–0.2)

## 2021-06-08 LAB — CBG MONITORING, ED: Glucose-Capillary: 138 mg/dL — ABNORMAL HIGH (ref 70–99)

## 2021-06-08 MED ORDER — DIATRIZOATE MEGLUMINE & SODIUM 66-10 % PO SOLN
30.0000 mL | Freq: Once | ORAL | Status: AC
  Administered 2021-06-08: 30 mL
  Filled 2021-06-08: qty 30

## 2021-06-08 MED ORDER — DIATRIZOATE MEGLUMINE & SODIUM 66-10 % PO SOLN: 30.0000 mL | Freq: Once | ORAL | Status: DC

## 2021-06-08 MED ORDER — METOPROLOL TARTRATE 5 MG/5ML IV SOLN
10.0000 mg | Freq: Three times a day (TID) | INTRAVENOUS | Status: DC | PRN
  Administered 2021-06-09 (×2): 10 mg via INTRAVENOUS
  Filled 2021-06-08 (×2): qty 10

## 2021-06-08 MED ORDER — HEPARIN SODIUM (PORCINE) 5000 UNIT/ML IJ SOLN
5000.0000 [IU] | Freq: Three times a day (TID) | INTRAMUSCULAR | Status: DC
  Administered 2021-06-08 – 2021-06-10 (×5): 5000 [IU] via SUBCUTANEOUS
  Filled 2021-06-08 (×5): qty 1

## 2021-06-08 MED ORDER — DIATRIZOATE MEGLUMINE & SODIUM 66-10 % PO SOLN
ORAL | Status: AC
  Filled 2021-06-08: qty 30

## 2021-06-08 MED ORDER — SODIUM CHLORIDE 0.9 % IV SOLN: INTRAVENOUS | Status: AC

## 2021-06-08 NOTE — H&P (Addendum)
Family Medicine Teaching Service ?Hospital Admission History and Physical ?Service Pager: [phone] ? ?Patient name: Stephen Delgado Medical record number: [id number] ?Date of birth: 10-Aug-1965 Age: 56 y.o. Gender: male ? ?Primary Care Provider: Haydee Salter, MD ?Consultants: General Surgery, interventional radiology ?Code Status: Full code ?Preferred Emergency Contact: Mom Stephen Delgado [phone] and son Stephen Delgado  ? ?Chief Complaint: G-tube dislodged ? ?Assessment and Plan: ?Stephen Delgado is a 56 y.o. male presenting with G-tube malfunction . PMH is significant for HTN, T2DM, IVH and bilateral embolic strokes (Dec 2022) s/p tracheostomy, trach dependent and G-tube dependent, CKD 3, HFmrEF ? ?G-tube malfunction ?Patient being admitted for G-tube replacement. G-tube was placed for nutritional support 01/18/2021 during hospitalization. Had recent replacement in ED on 5/1. Labs in the ED showed mildly elevated white blood cell count 11.7, hemoglobin of 10.9 and BMP which was overall normal with appropriate glucose of 167.  He has remained hypertensive with some tachycardia to low 100s, afebrile and saturating well on tracheostomy collar at FiO2 of 21%.  ED provider placed a 24 French PEG tube at bedside unsuccessfully.  A repeat placement attempt with a 22 French PEG tube was performed by ED provider with any gastric contents that had returned.  KUB at that time had shown that contrast not definitively in stomach.  CT abdomen pelvis showed extraperitoneal contrast with malposition in the left anterior abdominal wall. G-tube was removed and Dr. Laurell Josephs with general surgery was consulted in the ED. He recommended replacement by interventional radiology. Discussed with Dr. Miles Costain with IR to place order for tomorrow.  ?-Admit to FPTS, attending Dr. Leveda Anna, MedSurg ?-General surgery following, appreciate recommendations ?-IR consulted, plan for replacement tomorrow ?-mIVF for 12 hours, monitor fluid status ?-Vitals per floor  protocol  ?-Consider RD consult ?-AM BMP, CBC ?-Diet NPO  ?-DVT prophylaxis heparin ? 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 12/2020 during an admission showed an acute left basal ganglia intracranial hemorrhage  in the setting of hypertensive emergency. MRI 01/2021 during the same admission showed numerous small acute infarcts in bilateral cerebral hemispheres, left caudate hemorrhage, IVH with scattered sulcal SAH and mild communicating hydrocephalus. On examination unable to follow commands but awake and moving eyes. Has home medication of robaxin (4 times daily per son) for contractures. Received 1 dose this morning, unable to receive this medication d/t no G-tube access currently ?-Monitor blood pressures ?-Monitor neuro examination ?-Add home robaxin back when patient has G-tube access ? ?Tracheostomy dependent ?Tracheostomy was placed 01/13/2021 while in the hospital due to respiratory failure from recurrent serratia pneumonia and staph.  He is saturating 90 tracheostomy at 21% FiO2.  Respiratory exam without distress on examination today. ?-Oral care protocol ?-Monitor respiratory status ? ?HTN ?Home medications of amlodipine 10 mg daily, Coreg 12.5 mg twice daily, clonidine patch once weekly, Isordil 20 mg 3 times daily, losartan 50 mg daily. Blood pressure ranges here have been 147-188/90-117.  Discussed with son who said he took his Norvasc 10 mg today, Cozaar 50 mg today, Coreg 12.5 mg (only took one pill today), unsure about clonidine patch, and took 1 dose of Isordil 20 mg today.  Discussed with on-call pharmacist as patient does not have G-tube access to crush meds and blood pressure remains high and he recommended metoprolol 10 mg IV for BP control in this interim. ?-Metoprolol 10 mg every 8 hours as needed. Parameters: do  not administer if BP less than 90/60 or  heart rate less than 60; goal SBP <200, DBP <110, HR <100. ?-Monitor BP ?-Add back home medications when G-tube replaced ? ?HFpEF ?Last echocardiogram 12/31/2020 showed EF of 50 to 55% with grade 1 diastolic dysfunction.  At that time had pericardial effusion that was circumferential.  On examination patient appears mildly dehydrated mucous membranes are mildly dry and slightly tachycardic to low 100s.  No peripheral edema on examination ?-MIVF for 12 hours, monitor respiratory status and fluid status closely ?-Daily weights ?-Strict I's and O's ? ?Hx of CKD 3 ?Noted in chart review.  BMP today showed creatinine of 0.95 and BUN of 33.  Baseline has been around 0.9-1.1. ?-Monitor on BMP ? ?T2DM ?No insulin or medication requirement at home. A1c was 6.1 six days ago.  Glucose was 167 in ED. ?-CBG every 4 hours while patient is n.p.o. ? ?Hx of Sacral Ulcer ?On examination mild skin erosion on L buttocks noted. No sacral ulcer visualized when I was examining patient with RN.  ?-frequent repositioning ?-monitor for sacral wounds ? ?Goals of Care ?Has had conversations with palliative care on last admission and family wanted aggressive care/full code. Discussed with son Stephen Delgado today, would like patient to remain full code. Decision makers per Stephen Delgado are wife Stephen Delgado and son Stephen Delgado. ?-Full code ?-Consider palliative consult  ? ?FEN/GI: NPO ?Prophylaxis: Heparin ? ?Disposition: Med-Surg ? ?History of Present Illness:  Stephen Delgado is a 56 y.o. male presenting with dislodged G-tube. ? ?History per son Stephen Delgado: ? ?Has been home with home care and nurses that come in and stay for hours during the day and trach care. Today nurse was giving him a bed bath and rolled him to his side and and rolled him back onto his back and fluid came out of G tube site. 911 called by nurse and patient was transported to hospital. Last week had PEG tub issue during changing him-tubing possibly got caught up in gown during that time. No other issue  he has been having. Had an elevated temperature last week 1 time which resolved with tylenol, denies any other fevers/sick symptoms and overall at his baseline.  ? ?Came to emergency department 5/1 due to PEG tube dislodgment and a 24 [place] G-tube was placed with KUB confirmation.  Patient was discharged at that time. Today the PEG tube was dislodged again around 11 AM while he was being turned around. The ED provider placed a 24 French PEG tube at bedside unsuccessfully.  A repeat placement attempt with a 22 French PEG tube was performed by ED provider with any gastric contents that had returned.  KUB at that time had shown that this was not in the stomach.  CT abdomen pelvis showed extraperitoneal contrast NG tube was removed.  General surgery was consulted and recommended admission with IR placement of G-tube. ? ?Medications confirmed with son: ?Norvasc 10 mg daily-took today ?Cozaar 50 mg daily-took today ?Coreg 12.5- took one pill today ?Clonidine patch-not sure about that ?Robaxin 500 mg 3 times daily-6 am 10 am and 2 pm 10 pm ?Isordil 20 mg 3 times daily-1 dose today ? ?Review Of Systems: Per HPI with the following additions:  ? ?Review of Systems  ?Reason unable to perform ROS: Level 5 Caveat.  ?Constitutional:  Negative for fever.  ?Respiratory:  Negative for shortness of breath.   ?Skin:  Negative for wound.   ?Per son ? ?Patient Active Problem List  ? Diagnosis Date Noted  ?  Vomiting in adult   ? Hypophosphatemia   ? Metabolic acidosis   ? Ileus with hypokalemia 04/30/2021  ? Infection due to Enterobacter cloacae   ? UTI (urinary tract infection) with pyuria   ? Fever in adult   ? Anemia 04/26/2021  ? Acute conjunctivitis, bilateral 04/08/2021  ? Pressure injury of skin 04/06/2021  ? Diarrhea   ? Hypernatremia 03/28/2021  ? Protein-calorie malnutrition, severe 03/26/2021  ? Constipation 03/20/2021  ? Nontraumatic intracerebral hemorrhage (HCC)   ? Brain injury 2/2 acute intraparenchymal and  intraventricular hemorrhage/Bilateral multifocal ischemic infarcts with assoc muscle hypertonicity  03/04/2021  ? Protein-calorie malnutrition/dysphagia due to recent stroke/constipation 03/04/2021  ? Transaminitis

## 2021-06-08 NOTE — ED Notes (Signed)
Family reports g-tube got pulled out accidentally while family was changing pt. Pt is bed bound and unable to move all 4 extremities. Family reports g-tube was just replaced 2-3 weeks ago. Will continue to monitor.  ?

## 2021-06-08 NOTE — ED Triage Notes (Signed)
G-tube was accidentally pulled out while pt was getting changed at home. Bed bound pt with trach in place.  ?

## 2021-06-08 NOTE — ED Provider Notes (Signed)
?[place] ?Provider Note ? ? ?CSN: [id number] ?Arrival date & time: 06/08/21  1257 ? ?  ? ?History ? ?No chief complaint on file. ? ? ?Stephen Delgado is a 56 y.o. male. ? ?HPI ? ?56 year old male with medical history significant for HTN, DM 2, IVH and bilateral embolic strokes status post tracheostomy, trach dependent and G-tube dependent who presents to the emergency department after his gastrostomy tube fell out.  The patient was seen in the ED on 06/01/2021 after his PEG tube was displaced.  It was replaced bedside.  Today, while he was being turned around 1 hour prior to arrival, it fell out again.  Family were unsure of the size of the tube, a 92 French catheter was reportedly placed although it appears that a 86 [place] was placed previously. ? ?Home Medications ?Prior to Admission medications   ?Medication Sig Start Date End Date Taking? Authorizing Provider  ?acetaminophen (TYLENOL) 325 MG tablet Place 2 tablets (650 mg total) into feeding tube every 6 (six) hours. 05/21/21   Elgergawy, Silver Huguenin, MD  ?albuterol (PROVENTIL) (2.5 MG/3ML) 0.083% nebulizer solution Take 3 mLs (2.5 mg total) by nebulization every 4 (four) hours as needed for wheezing or shortness of breath. 05/21/21   Elgergawy, Silver Huguenin, MD  ?amLODipine (NORVASC) 10 MG tablet Place 1 tablet (10 mg total) into feeding tube daily. 05/22/21   Elgergawy, Silver Huguenin, MD  ?Blood Glucose Monitoring Suppl (BLOOD GLUCOSE MONITOR SYSTEM) w/Device KIT Use as directed up to 4 times a day 05/30/21   Elwin Mocha, MD  ?Blood Pressure Monitoring DEVI 1 application. by Does not apply route as needed. 06/03/21   Elwin Mocha, MD  ?carvedilol (COREG) 12.5 MG tablet Place 1 tablet (12.5 mg total) into feeding tube 2 (two) times daily with a meal. 05/21/21   Elgergawy, Silver Huguenin, MD  ?chlorhexidine (PERIDEX) 0.12 % solution Rinse and spit 15 mLs by mouth 2 (two) times daily.  Do not eat or drink for 30 minutes afterward. 05/21/21  06/20/21  Elgergawy, Silver Huguenin, MD  ?cloNIDine (CATAPRES - DOSED IN MG/24 HR) 0.2 mg/24hr patch Place 1 patch (0.2 mg total) onto the skin once a week. 05/22/21   Elgergawy, Silver Huguenin, MD  ?collagenase (SANTYL) 250 UNIT/GM ointment Apply 1 application. topically 2 (two) times daily. To  affected area 05/30/21   Elwin Mocha, MD  ?glucose blood test strip Use to check blood sugar up to 4 times a day 05/30/21   Elwin Mocha, MD  ?isosorbide dinitrate (ISORDIL) 20 MG tablet Place 1 tablet (20 mg total) into feeding tube 3 (three) times daily. 05/21/21   Elgergawy, Silver Huguenin, MD  ?Lancets (ONETOUCH DELICA PLUS [id number]) [place] Use as directed to test blood sugar up to 4 times a day 05/30/21   Elwin Mocha, MD  ?losartan (COZAAR) 50 MG tablet Place 1 tablet (50 mg total) into feeding tube daily. 05/22/21   Elgergawy, Silver Huguenin, MD  ?methocarbamol (ROBAXIN) 500 MG tablet Place 1 tablet (500 mg total) into feeding tube 3 (three) times daily. 05/21/21   Elgergawy, Silver Huguenin, MD  ?Mouthwashes (MOUTH RINSE) LIQD solution 15 mLs by Mouth Rinse route 2 times daily at 12 noon and 4 pm. 05/21/21   Elgergawy, Silver Huguenin, MD  ?Nutritional Supplements (FEEDING SUPPLEMENT, OSMOLITE 1.5 CAL,) LIQD Place 1,000 mLs into feeding tube continuous. 05/22/21   Elgergawy, Silver Huguenin, MD  ?Nutritional Supplements (FEEDING SUPPLEMENT, PROSOURCE TF,) liquid Place 45 mLs  into feeding tube 2 (two) times daily. 05/21/21   Elgergawy, Silver Huguenin, MD  ?Water For Irrigation, Sterile (FREE WATER) SOLN Place 200 mLs into feeding tube every 4 (four) hours. 05/21/21   Elgergawy, Silver Huguenin, MD  ?   ? ?Allergies    ?Patient has no known allergies.   ? ?Review of Systems   ?Review of Systems  ?All other systems reviewed and are negative. ? ?Physical Exam ?Updated Vital Signs ?BP (!) 188/105   Pulse (!) 104   Temp 99.1 ?F (37.3 ?C) (Oral)   Resp 20   SpO2 98%  ?Physical Exam ?Vitals and nursing note reviewed.  ?Constitutional:   ?   General: He is not in acute  distress. ?HENT:  ?   Head: Normocephalic and atraumatic.  ?Eyes:  ?   Conjunctiva/sclera: Conjunctivae normal.  ?   Pupils: Pupils are equal, round, and reactive to light.  ?Cardiovascular:  ?   Rate and Rhythm: Normal rate and regular rhythm.  ?Pulmonary:  ?   Effort: Pulmonary effort is normal. No respiratory distress.  ?   Breath sounds: No rhonchi.  ?   Comments: Trach in place ?Abdominal:  ?   General: There is no distension.  ?   Tenderness: There is no abdominal tenderness. There is no guarding.  ?   Comments: PEG stoma without discharge or erythema  ?Musculoskeletal:     ?   General: No deformity or signs of injury.  ?   Cervical back: Neck supple.  ?Skin: ?   Findings: No lesion or rash.  ?Neurological:  ?   General: No focal deficit present.  ?   Mental Status: He is alert. Mental status is at baseline.  ? ? ?ED Results / Procedures / Treatments   ?Labs ?(all labs ordered are listed, but only abnormal results are displayed) ?Labs Reviewed - No data to display ? ?EKG ?None ? ?Radiology ?DG ABDOMEN PEG TUBE LOCATION ? ?Result Date: 06/08/2021 ?CLINICAL DATA:  Gastrostomy tube replacement. EXAM: ABDOMEN - 1 VIEW COMPARISON:  06/01/2021 FINDINGS: A balloon retention gastrostomy tube was injected with contrast material. Based on contrast distribution by x-ray, the catheter is not definitively in the stomach. Correlation with CT of the abdomen is recommended. Bowel gas demonstrates evidence of probable ileus involving colon and small bowel. IMPRESSION: Distribution of injected contrast material is not definitively in the stomach based on appearance by x-ray. Correlation with CT of the abdomen is recommended. Probable ileus involving colon and small bowel. Electronically Signed   By: Aletta Edouard M.D.   On: 06/08/2021 18:21   ? ?Procedures ?Gastrostomy tube replacement ? ?Date/Time: 06/08/2021 6:37 PM ?Performed by: Regan Lemming, MD ?Authorized by: Regan Lemming, MD  ?Consent: The procedure was performed in an  emergent situation. ?Risks and benefits: risks, benefits and alternatives were discussed ?Required items: required blood products, implants, devices, and special equipment available ?Patient identity confirmed: arm band ?Time out: Immediately prior to procedure a "time out" was called to verify the correct patient, procedure, equipment, support staff and site/side marked as required. ?Preparation: Patient was prepped and draped in the usual sterile fashion. ?Local anesthesia used: no ? ?Anesthesia: ?Local anesthesia used: no ? ?Sedation: ?Patient sedated: no ? ?Patient tolerance: patient tolerated the procedure well with no immediate complications ?Comments: Unsuccessful attempt with resistance met ? ?  ? ? ?Medications Ordered in ED ?Medications  ?diatrizoate meglumine-sodium (GASTROGRAFIN) 66-10 % solution (  Not Given 06/08/21 1809)  ?diatrizoate meglumine-sodium (GASTROGRAFIN) 66-10 % solution  30 mL (30 mLs Per Tube Given 06/08/21 1800)  ? ? ?ED Course/ Medical Decision Making/ A&P ?  ?                        ?Medical Decision Making ?Amount and/or Complexity of Data Reviewed ?Radiology: ordered. ? ?Risk ?Prescription drug management. ? ? ?56 year old male with medical history significant for HTN, DM 2, IVH and bilateral embolic strokes status post tracheostomy, trach dependent and G-tube dependent who presents to the emergency department after his gastrostomy tube fell out.  The patient was seen in the ED on 06/01/2021 after his PEG tube was displaced.  It was replaced bedside.  Today, while he was being turned around 1 hour prior to arrival, it fell out again.  Family were unsure of the size of the tube, a 77 French catheter was reportedly placed although it appears that a 44 [place] was placed previously. ? ?On arrival, the patient was stable.  Presenting after displacement of his PEG tube.  Replacement with 24 French PEG tube attempted bedside unsuccessful.  We will plan repeat placement attempt with a 22 French  PEG tube and obtain KUB to confirm placement.  Signout given to Dr. Roslynn Amble at [phone].  Please see his note for further MDM. ? ?Final Clinical Impression(s) / ED Diagnoses ?Final diagnoses:  ?None  ? ? ?Rx / DC Orders

## 2021-06-08 NOTE — ED Provider Notes (Addendum)
Signout note ? ?56 y/o male with concern for dislodge G tube. Came out while patient was getting changed at home. Dr. Armandina Gemma attempted g tube placement with a 24 french g tube but met significant resistance and removed the tube.  At time of signout, plan was to attempt placement with a 22 gauge.  I placed a 22 french G tube - see procedure note below - only very minimal resistance was met initially during insertion and only very gentle pressure was applied during insertion.  Inserted ~5cm in total and inflated balloon. No gastric contents were returned but patient had not had any feeds in a while. KUB no definitively in stomach. I discussed with [place]. He recommends checking CT abd/pel wo for further investigation. I asked CT staff to prioritize pt for scan. Updated pt son. Older son reports pt was receiving tube feeds up until shortly before he was brought to ER.  Pt still has not gone for CT, will page General Surgery. Discussed with [place] Silos, he will come evaluate patient, advised removing G tube if CT confirms suspicion.  I reviewed CT, contrast is extraperitoneal, not in stomach. Deflated baloon, removed G tube. Will need gen surg or IR for subsequent placement attempt. Updated son. Will admit to medicine.  ? ?Gastrostomy tube replacement ? ?Date/Time: 06/08/2021 8:30 PM ?Performed by: Lucrezia Starch, MD ?Authorized by: Lucrezia Starch, MD  ?Consent: Verbal consent obtained. ?Risks and benefits: risks, benefits and alternatives were discussed ?Consent given by: son at bedside. ?Time out: Immediately prior to procedure a "time out" was called to verify the correct patient, procedure, equipment, support staff and site/side marked as required. ?Preparation: Patient was prepped and draped in the usual sterile fashion. ?Local anesthesia used: no ? ?Anesthesia: ?Local anesthesia used: no ? ?Sedation: ?Patient sedated: no ? ?Comments: Prepped skin with betadine. Inserted 22 french G tube approximately and  felt some resistance. Inflated balloon with 1m of sterile saline. Secured. No return of gastric contents. XR ordered to check location.  ? ? ? ? ? ?  ?DLucrezia Starch MD ?06/08/21 2034 ? ?  ?DLucrezia Starch MD ?06/08/21 2034 ? ?

## 2021-06-08 NOTE — Consult Note (Signed)
Reason for Consult:dislodged G tube ?Referring Physician: Madalyn Rob ? ?Stephen Delgado is an 56 y.o. male.  ?HPI: 56yo M with HX CVA in December, 2022.  I placed a PEG tube in him for nutritional support 01/18/2021.  He has progressed through rehab and has been living at home.  He is still taking meds and tube feeds via his tube.  It recently became dislodged.  It was replaced in the emergency room.  He returns tonight after he got dislodged again.  The EDP attempted to replace it but was unable to get it in the correct position.  His son is at the bedside and provides some history. ? ?Past Medical History:  ?Diagnosis Date  ? Acute respiratory failure ([place])   ? a. 12/2020 in setting of IVH/strokes-->s/p trach.  ? Allergy   ? Bilateral Embolic strokes ([facility])   ? a. 01/2021 MRI: numerous sm acute infarcts in bilat cerebral hemispheres, Lt caudate hemorrhage, IVH, scattered sulcal SAH, mild communicating hydrocephalus.  ? CKD (chronic kidney disease), stage III ([place])   ? Diastolic dysfunction   ? a. 04/2015 Echo: EF 50-55%, mod LVH, no rwma, GrI DD, triv AI, + ASD; b. 12/2020 Echo: EF 50-55%, no rwma, grIDD, nl RV fxn, triv MR, mild AI, Asc Ao 47mm.  ? Hypertension   ? Intraventricular hemorrhage ([place])   ? a. 12/2020 Aucte L basal ganglia intraparenchymal hemorrhage w/ IVH in the setting of hypertensive emergency.  ? Type II diabetes mellitus ([place])   ? ? ?Past Surgical History:  ?Procedure Laterality Date  ? ESOPHAGOGASTRODUODENOSCOPY N/A 01/16/2021  ? Procedure: ESOPHAGOGASTRODUODENOSCOPY (EGD);  Surgeon: Georganna Skeans, MD;  Location: [facility];  Service: Endoscopy;  Laterality: N/A;  ? NO PAST SURGERIES    ? PEG PLACEMENT N/A 01/16/2021  ? Procedure: PERCUTANEOUS ENDOSCOPIC GASTROSTOMY (PEG) PLACEMENT;  Surgeon: Georganna Skeans, MD;  Location: [facility];  Service: Endoscopy;  Laterality: N/A;  ? ? ?Family History  ?Problem Relation Age of Onset  ? Stroke Paternal Uncle   ? ? ?Social History:  reports that  he has never smoked. He has never used smokeless tobacco. He reports current alcohol use. He reports that he does not use drugs. ? ?Allergies: No Known Allergies ? ?Medications: I have reviewed the patient's current medications. ? ?No results found for this or any previous visit (from the past 48 hour(s)). ? ?CT ABDOMEN WO CONTRAST ? ?Result Date: 06/08/2021 ?CLINICAL DATA:  Malposition gastrostomy tube EXAM: CT ABDOMEN WITHOUT CONTRAST TECHNIQUE: Multidetector CT imaging of the abdomen was performed following the standard protocol without IV contrast. RADIATION DOSE REDUCTION: This exam was performed according to the departmental dose-optimization program which includes automated exposure control, adjustment of the mA and/or kV according to patient size and/or use of iterative reconstruction technique. COMPARISON:  Radiograph 06/08/2021, CT 05/04/2021 FINDINGS: Lower chest: Lung bases demonstrate no acute consolidation or effusion. Mild cardiomegaly. Hepatobiliary: Cyst within the central hepatic dome, no follow-up imaging recommended. No calcified gallstone. No biliary dilatation. Pancreas: Unremarkable. No pancreatic ductal dilatation or surrounding inflammatory changes. Spleen: Normal in size without focal abnormality. Adrenals/Urinary Tract: Adrenal glands are normal. Excreted contrast within the kidneys. No hydronephrosis. Stomach/Bowel: Malposition percutaneous gastrostomy tube. The radiopaque balloon appears position within the abdominal wall, slightly deep to the rectus muscle. There is a 14 x 3 cm contrast collection superficial to the peritoneal cavity. Vascular/Lymphatic: No suspicious lymph nodes.  No aneurysm Other: No free air Musculoskeletal: No acute osseous abnormality IMPRESSION: Malposition percutaneous gastrostomy tube with balloon  and tip visible in the left anterior abdominal wall, deep to the left rectus muscle. There is contrast collecting within the left anterior abdominal wall, superficial  to the peritoneal cavity. Critical Value/emergent results were called by telephone at the time of interpretation on 06/08/2021 at 7:46 pm to provider [facility] , who verbally acknowledged these results. Electronically Signed   By: Donavan Foil M.D.   On: 06/08/2021 19:46  ? ?DG ABDOMEN PEG TUBE LOCATION ? ?Result Date: 06/08/2021 ?CLINICAL DATA:  Gastrostomy tube replacement. EXAM: ABDOMEN - 1 VIEW COMPARISON:  06/01/2021 FINDINGS: A balloon retention gastrostomy tube was injected with contrast material. Based on contrast distribution by x-ray, the catheter is not definitively in the stomach. Correlation with CT of the abdomen is recommended. Bowel gas demonstrates evidence of probable ileus involving colon and small bowel. IMPRESSION: Distribution of injected contrast material is not definitively in the stomach based on appearance by x-ray. Correlation with CT of the abdomen is recommended. Probable ileus involving colon and small bowel. Electronically Signed   By: Aletta Edouard M.D.   On: 06/08/2021 18:21   ? ?Review of Systems  ?Unable to perform ROS: Mental status change  ?Blood pressure (!) 147/101, pulse 98, temperature 99.1 ?F (37.3 ?C), temperature source Oral, resp. rate 16, SpO2 98 %. ?Physical Exam ?HENT:  ?   Head: Normocephalic.  ?   Mouth/Throat:  ?   Mouth: Mucous membranes are dry.  ?Neck:  ?   Comments: Trach ?Cardiovascular:  ?   Rate and Rhythm: Normal rate and regular rhythm.  ?Pulmonary:  ?   Effort: Pulmonary effort is normal.  ?   Breath sounds: Normal breath sounds.  ?Abdominal:  ?   General: Abdomen is flat.  ?   Palpations: Abdomen is soft.  ?   Comments: G-tube site with minimal drainage.  No generalized tenderness, no significant focal tenderness.  No guarding.  ?Musculoskeletal:  ?   Comments: Some contractures bilateral upper extremities  ?Neurological:  ?   Comments: Eyes open but not communicative, does not follow commands  ? ? ?Assessment/Plan: ?Dislodged gastrostomy tube  -recommend replacement by interventional radiology.  It has been there since last December so that should be successful.  I discussed this with his son.  Appreciate admission by the hospitalist service.  We will follow along. ? ?Zenovia Jarred ?06/08/2021, 8:08 PM  ? ? ? ? ?

## 2021-06-09 ENCOUNTER — Observation Stay (HOSPITAL_COMMUNITY): Payer: Commercial Managed Care - HMO

## 2021-06-09 ENCOUNTER — Other Ambulatory Visit: Payer: Self-pay | Admitting: Nurse Practitioner

## 2021-06-09 ENCOUNTER — Other Ambulatory Visit: Payer: Self-pay

## 2021-06-09 DIAGNOSIS — T85528A Displacement of other gastrointestinal prosthetic devices, implants and grafts, initial encounter: Secondary | ICD-10-CM | POA: Diagnosis not present

## 2021-06-09 HISTORY — PX: IR REPLC GASTRO/COLONIC TUBE PERCUT W/FLUORO: IMG2333

## 2021-06-09 LAB — CBC
HCT: 33.8 % — ABNORMAL LOW (ref 39.0–52.0)
Hemoglobin: 10.3 g/dL — ABNORMAL LOW (ref 13.0–17.0)
MCH: 24.1 pg — ABNORMAL LOW (ref 26.0–34.0)
MCHC: 30.5 g/dL (ref 30.0–36.0)
MCV: 79.2 fL — ABNORMAL LOW (ref 80.0–100.0)
Platelets: 313 10*3/uL (ref 150–400)
RBC: 4.27 MIL/uL (ref 4.22–5.81)
RDW: 17.3 % — ABNORMAL HIGH (ref 11.5–15.5)
WBC: 12.7 10*3/uL — ABNORMAL HIGH (ref 4.0–10.5)
nRBC: 0 % (ref 0.0–0.2)

## 2021-06-09 LAB — BASIC METABOLIC PANEL
Anion gap: 10 (ref 5–15)
BUN: 30 mg/dL — ABNORMAL HIGH (ref 6–20)
CO2: 25 mmol/L (ref 22–32)
Calcium: 9.7 mg/dL (ref 8.9–10.3)
Chloride: 106 mmol/L (ref 98–111)
Creatinine, Ser: 0.89 mg/dL (ref 0.61–1.24)
GFR, Estimated: >60 mL/min (ref >60)
Glucose, Bld: 142 mg/dL — ABNORMAL HIGH (ref 70–99)
Potassium: 3.4 mmol/L — ABNORMAL LOW (ref 3.5–5.1)
Sodium: 141 mmol/L (ref 135–145)

## 2021-06-09 LAB — GLUCOSE, CAPILLARY
Glucose-Capillary: 119 mg/dL — ABNORMAL HIGH (ref 70–99)
Glucose-Capillary: 127 mg/dL — ABNORMAL HIGH (ref 70–99)

## 2021-06-09 LAB — CBG MONITORING, ED
Glucose-Capillary: 139 mg/dL — ABNORMAL HIGH (ref 70–99)
Glucose-Capillary: 128 mg/dL — ABNORMAL HIGH (ref 70–99)

## 2021-06-09 MED ORDER — AMLODIPINE BESYLATE 10 MG PO TABS
10.0000 mg | ORAL_TABLET | Freq: Every day | ORAL | Status: DC
  Administered 2021-06-09 – 2021-06-10 (×2): 10 mg
  Filled 2021-06-09 (×2): qty 1

## 2021-06-09 MED ORDER — CLONIDINE HCL 0.2 MG/24HR TD PTWK
0.2000 mg | MEDICATED_PATCH | TRANSDERMAL | Status: DC
  Administered 2021-06-09: 0.2 mg via TRANSDERMAL
  Filled 2021-06-09: qty 1

## 2021-06-09 MED ORDER — POTASSIUM CHLORIDE 10 MEQ/100ML IV SOLN
10.0000 meq | INTRAVENOUS | Status: AC
  Administered 2021-06-09 (×4): 10 meq via INTRAVENOUS
  Filled 2021-06-09 (×4): qty 100

## 2021-06-09 MED ORDER — COLLAGENASE 250 UNIT/GM EX OINT
1.0000 "application " | TOPICAL_OINTMENT | Freq: Two times a day (BID) | CUTANEOUS | Status: DC
  Administered 2021-06-09: 1 via TOPICAL
  Filled 2021-06-09: qty 30

## 2021-06-09 MED ORDER — ACETAMINOPHEN 325 MG PO TABS
650.0000 mg | ORAL_TABLET | Freq: Four times a day (QID) | ORAL | Status: DC
  Administered 2021-06-09 – 2021-06-10 (×3): 650 mg
  Filled 2021-06-09 (×2): qty 2

## 2021-06-09 MED ORDER — METHOCARBAMOL 500 MG PO TABS
500.0000 mg | ORAL_TABLET | Freq: Three times a day (TID) | ORAL | Status: DC
Start: 2021-06-09 — End: 2021-06-10
  Administered 2021-06-09 – 2021-06-10 (×3): 500 mg
  Filled 2021-06-09 (×3): qty 1

## 2021-06-09 MED ORDER — LOSARTAN POTASSIUM 50 MG PO TABS
50.0000 mg | ORAL_TABLET | Freq: Every day | ORAL | Status: DC
  Administered 2021-06-09 – 2021-06-10 (×2): 50 mg
  Filled 2021-06-09 (×2): qty 1

## 2021-06-09 MED ORDER — IOHEXOL 300 MG/ML  SOLN
100.0000 mL | Freq: Once | INTRAMUSCULAR | Status: AC | PRN
  Administered 2021-06-09: 10 mL

## 2021-06-09 MED ORDER — ISOSORBIDE DINITRATE 20 MG PO TABS
20.0000 mg | ORAL_TABLET | Freq: Three times a day (TID) | ORAL | Status: DC
  Administered 2021-06-09 – 2021-06-10 (×3): 20 mg
  Filled 2021-06-09 (×5): qty 1

## 2021-06-09 MED ORDER — ALBUTEROL SULFATE (2.5 MG/3ML) 0.083% IN NEBU: 2.5000 mg | INHALATION_SOLUTION | RESPIRATORY_TRACT | Status: DC | PRN

## 2021-06-09 MED ORDER — LIDOCAINE VISCOUS HCL 2 % MT SOLN
OROMUCOSAL | Status: AC
  Filled 2021-06-09: qty 15

## 2021-06-09 MED ORDER — CARVEDILOL 12.5 MG PO TABS
12.5000 mg | ORAL_TABLET | Freq: Two times a day (BID) | ORAL | Status: DC
  Administered 2021-06-09 – 2021-06-10 (×2): 12.5 mg
  Filled 2021-06-09 (×2): qty 1

## 2021-06-09 NOTE — ED Notes (Signed)
Patient belongings placed in the bed with patient as well as other trach care items. Respiratory aware that the patient is going up with tele tracking and reports that the patient is safe to do so. Patients mother is aware of where the belongings are and where the patient is located ?

## 2021-06-09 NOTE — Progress Notes (Signed)
FPTS Brief Progress Note ? ?S:patient resting  ? ? ?O: ?BP (!) 179/121   Pulse (!) 109   Temp 99.1 ?F (37.3 ?C) (Oral)   Resp 20   SpO2 98%   ? ? ?A/P: ?Hypertension ?Home meds held due to NPO and displaced G tube. Metoprolol 10 mg given at 108. Repeat vitals show: 144/100, HR 99 ?- Goal SBP <200 >90, DBP <110 >60, HR <100 >60 ?- Orders reviewed. Labs for AM ordered, which was adjusted as needed.  ?- See H&P for full plan details ? ?Shirlean Mylar, MD ?06/09/2021, 2:43 AM ?PGY-3, [facility] Night Resident  ?Please page [phone] with questions.  ? ? ?

## 2021-06-09 NOTE — ED Notes (Signed)
Checked patient cbg it was 128 notified the RN of blood sugar patient is resting with call bell in reach and family at bedside ?

## 2021-06-09 NOTE — Procedures (Signed)
Interventional Radiology Procedure Note ? ?Procedure:  ?Bedside replacement of gtube. 63F balloon retention.  ? ?Findings:  ?Gtube in place. ?.  ?Complications: None ? ?Recommendations:  ?- Ok to use ?  ? ?Signed, ? ?Yvone Neu. Loreta Ave, DO ? ? ?

## 2021-06-09 NOTE — TOC CAGE-AID Note (Signed)
Transition of Care (TOC) - CAGE-AID Screening ? ? ?Patient Details  ?Name: Stephen Delgado ?MRN: [id number] ?Date of Birth: [birth year] ? ?Transition of Care (TOC) CM/SW Contact:    ?Sharell Hilmer C Tarpley-Carter, LCSWA ?Phone Number: ?06/09/2021, 12:51 PM ? ? ?Clinical Narrative: ?Pt participated in Cage-Aid.  Pt stated he does not use substance or ETOH.  Pt was not offered resources, due to no usage of substance or ETOH.    ? ?Insurance underwriter, MSW, LCSW-A ?Pronouns:  She/Her/Hers ?[facility] of [facility] ?Clinical Social Worker ?Direct Number:  [phone] ?[email or web address] ? ?CAGE-AID Screening: ?  ? ?Have You Ever Felt You Ought to Cut Down on Your Drinking or Drug Use?: No ?Have People Annoyed You By Critizing Your Drinking Or Drug Use?: No ?Have You Felt Bad Or Guilty About Your Drinking Or Drug Use?: No ?Have You Ever Had a Drink or Used Drugs First Thing In The Morning to Steady Your Nerves or to Get Rid of a Hangover?: No ?CAGE-AID Score: 0 ? ?Substance Abuse Education Offered: No ? ?  ? ? ? ? ? ? ?

## 2021-06-09 NOTE — Progress Notes (Signed)
New Admission Note:  ? ?Arrival Method: stretcher  ?Mental Orientation:not sure of orientation patient is non verbal  ?Telemetry: Box 20  ?Assessment: Completed ?Skin: healing sacral wound ?IV: left hand  ?Pain: 0/10  ?Tubes: trach. Gtube  ?Safety Measures: Safety Fall Prevention Plan has been given, discussed and signed ?Admission: Completed ?[facility] Orientation: Patient has been orientated to the room, unit and staff.  ?Family: wife and son  ? ?Orders have been reviewed and implemented. Will continue to monitor the patient. Call light has been placed within reach and bed alarm has been activated.  ? ?Eh Sauseda RN ?[facility] ?Phone: [phone]  ?

## 2021-06-09 NOTE — ED Notes (Signed)
Placed pt on hospital bed w/air mattress. ?

## 2021-06-09 NOTE — Progress Notes (Signed)
[facility] ED[facility]) Hospital Liaison note: ? ?This is a pending outpatient-based Palliative Care patient. Will continue to follow for disposition. ? ?Please call with any outpatient palliative questions or concerns. ? ?Thank you, ?Abran Cantor, LPN ?[facility] Hospital Liaison ?[phone] ?

## 2021-06-09 NOTE — Progress Notes (Signed)
Family Medicine Teaching Service ?Daily Progress Note ?Intern Pager: [phone] ? ?Patient name: JODECI CINK Medical record number: [id number] ?Date of birth: Dec 12, 1965 Age: 56 y.o. Gender: male ? ?Primary Care Provider: Elwin Mocha, MD ?Consultants: General surgery, IR ?Code Status: Full Code  ? ?Pt Overview and Major Events to Date:  ?06/08/2021: Intial encounter ? ?[x]  RD consulted ?[ ]  Order air mattress ?[ ]  IVF x12 hours-restart home meds ?[ ]  Palliative ?[ ]  WBC: 11.3, 11.7, 12.7 ?[ ]  BUN: 30, 33, 30 ? ?Assessment and Plan: ?MYRNA MAJID is a 56 y.o. 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  ?Had recent replacement in [facility] on 5/1. ?HD 1  ? ?G-tube malfunction s/p exchange 22 French PEG tube (failed 24 tube) ?Tube eroded stomach, requiring IR to replace. Unclear if perf, inconclusive on KUB.  CTA showed contrast collection. Elevated T 99, no Tylenol. Up trending WBC (!) 12.7, HR to 109. ?1 L IVF yesterday, no crackles on lungs. Upper respiratory gurgling. No G-tube and abdomen, but site appears clean, no bleed through undressing. ?Gen surg, IR following ?IR tube replacement 5/9 ?IVF maintenance x12 hours because no tube ?RD consulted for assistance ?CBC, BMP, Mg daily end 5/12-infection & electrolytes ? 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  ?Home: Robaxin ?Turn patient, sacral foam dressing, heel protectors, air mattress (unsure of how to order) ?Restarted home Robaxin 500 mg 3 times daily ? ?[place] ?Initiate conversation with patient and family for palliative consult ? 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 ?K+ 3.4-repleted for runs 10 mg once ?Cr 0.89 no  output yet ?Home: amlodipine 10 mg daily, Coreg 12.5 mg twice daily, clonidine patch once weekly, Isordil 20 mg 3 times daily, losartan 50 mg daily -held due to NPO, we will restart when confirmed G-tube ?Parameters: do not administer if BP less than 90/60 or heart rate less than 60; goal SBP <200, DBP <110, HR <100 ?Strict I/Os & daily weights ?BMP and Mg2+ daily ?K+ >4, Mg2+ >2, replete as needed ?IV Metoprolol 10 mg every 8 hours as needed ?External cath ordered ?Restarted home Coreg 12.5 mg BID per tube, amlodipine 10 mg daily per tube, losartan 50 mg per tube ?Restarted home clonidine patch weekly ? ? ?DM2 ?A1c 6.1 (06/02/2021). Yd CBGs 160s-130s, fasting CBG 142 (IP CBG goal <180).  ?CGBs q4hrs & no SSI ?Basal: None ? ?CKD 3 ?Cr 0.89  ?Labs per above ? ? ?FEN/GI: NPO ?PPx: heparin injection 5,000 Units Start: 06/08/21 2230  ? ? ?Dispo: Pending surgery and improvement.  Likely home ?DME: [facility] ?Barrier: G-tube replacement ? ? ?Subjective:  ?ON: Hypertensive overnight, started IV metoprolol per above ? ?Wife and son at bedside. ? ?Per family, patient is at baseline.  He is unable to communicate.  He did not appear agitated.  Protecting his airway's.  Family declined palliative and spiritual services.  ?Baseline patient has residual left-sided lower extremity weakness compared to right. ? ?Objective: ?Temp:  [99.1 ?F (37.3 ?C)] 99.1 ?F (37.3 ?C) (05/09 0857) ?Pulse Rate:  [83-110] 93 (05/09 1430) ?Resp:  [16-26] 22 (05/09 1430) ?BP: (137-191)/(85-124) 147/92 (05/09 1430) ?SpO2:  [97 %-100 %] 98 % (05/09 1430) ?FiO2 (%):  [21 %] 21 % (  05/09 1130) ? ?Physical exam ?General appearance: Nonresponsive, no sedation. ?HEENT: Open and closed spontaneously, roving eye movements.  No excessive sialorrhea ?CV: Tachycardic, regular rhythm, no murmurs appreciated ?Pulm: Grossly no rales bilaterally, difficult to appreciate due to upper respiratory airways sounds from oral secretions ?Extremities: Bilateral upper extremity  contracted medially.  Bilateral lower extremity 2+ pitting edema up to the ankle. ?Cranial nerves: Corneal is intact, ocular vestibular intact, does not blink to threat, no obvious facial drooping, cough intact ?Sensory/motor: Bilateral lower extremity extends to noxious stimuli, with right side stronger than left side.  Upper extremity are contracted, does not appreciate movement to stimuli.  ?Reflexes: No Babinski's-mute ? ?MSE ?Alert: Not alert or awake.  Extends bilateral lower extremity to noxious stimuli, possibly posturing, difficult to tell.  Eyes do not track to sound.  Nonpurposeful movement.  Spontaneous eye blinking and roving eye movements ?Language: Noncommunicative, nonverbal.  Trach dependent.  Does not follow commands. ? ?Laboratory: ?Recent Labs  ?Lab 06/02/21 ?1513 06/08/21 ?1937 06/09/21 ?0300  ?WBC 11.3* 11.7* 12.7*  ?HGB 10.7* 10.9* 10.3*  ?HCT 32.6* 35.7* 33.8*  ?PLT 309 307 313  ? ?Recent Labs  ?Lab 06/02/21 ?1513 06/08/21 ?1937 06/09/21 ?0300  ?NA 141 138 141  ?K 4.0 3.7 3.4*  ?CL 101 104 106  ?CO2 25 24 25   ?BUN 30* 33* 30*  ?CREATININE 0.90 0.95 0.89  ?CALCIUM 9.8 9.8 9.7  ?PROT 7.1  --   --   ?BILITOT <0.2  --   --   ?ALKPHOS 75  --   --   ?ALT 32  --   --   ?AST 18  --   --   ?GLUCOSE 193* 167* 142*  ? ? ?Imaging/Diagnostic Tests: ? ?CT ABDOMEN WO CONTRAST ? ?Result Date: 06/08/2021 ?CLINICAL DATA:  Malposition gastrostomy tube EXAM: CT ABDOMEN WITHOUT CONTRAST TECHNIQUE: Multidetector CT imaging of the abdomen was performed following the standard protocol without IV contrast. RADIATION DOSE REDUCTION: This exam was performed according to the departmental dose-optimization program which includes automated exposure control, adjustment of the mA and/or kV according to patient size and/or use of iterative reconstruction technique. COMPARISON:  Radiograph 06/08/2021, CT 05/04/2021 FINDINGS: Lower chest: Lung bases demonstrate no acute consolidation or effusion. Mild cardiomegaly.  Hepatobiliary: Cyst within the central hepatic dome, no follow-up imaging recommended. No calcified gallstone. No biliary dilatation. Pancreas: Unremarkable. No pancreatic ductal dilatation or surrounding inflammatory changes. Spleen: Normal in size without focal abnormality. Adrenals/Urinary Tract: Adrenal glands are normal. Excreted contrast within the kidneys. No hydronephrosis. Stomach/Bowel: Malposition percutaneous gastrostomy tube. The radiopaque balloon appears position within the abdominal wall, slightly deep to the rectus muscle. There is a 14 x 3 cm contrast collection superficial to the peritoneal cavity. Vascular/Lymphatic: No suspicious lymph nodes.  No aneurysm Other: No free air Musculoskeletal: No acute osseous abnormality IMPRESSION: Malposition percutaneous gastrostomy tube with balloon and tip visible in the left anterior abdominal wall, deep to the left rectus muscle. There is contrast collecting within the left anterior abdominal wall, superficial to the peritoneal cavity. Critical Value/emergent results were called by telephone at the time of interpretation on 06/08/2021 at 7:46 pm to provider [facility] , who verbally acknowledged these results. Electronically Signed   By: Donavan Foil M.D.   On: 06/08/2021 19:46  ? ?DG ABDOMEN PEG TUBE LOCATION ? ?Result Date: 06/08/2021 ?CLINICAL DATA:  Gastrostomy tube replacement. EXAM: ABDOMEN - 1 VIEW COMPARISON:  06/01/2021 FINDINGS: A balloon retention gastrostomy tube was injected with contrast material. Based on  contrast distribution by x-ray, the catheter is not definitively in the stomach. Correlation with CT of the abdomen is recommended. Bowel gas demonstrates evidence of probable ileus involving colon and small bowel. IMPRESSION: Distribution of injected contrast material is not definitively in the stomach based on appearance by x-ray. Correlation with CT of the abdomen is recommended. Probable ileus involving colon and small bowel.  Electronically Signed   By: Aletta Edouard M.D.   On: 06/08/2021 18:21  ? ?IR REPLACE GASTRO/JEJUNO TUBE PERC W/FLUORO ? ?Result Date: 06/09/2021 ?INDICATION: 56 year old male referred for rescue of a displaced gastrostomy EXAM: GASTR

## 2021-06-09 NOTE — ED Notes (Signed)
Pt was incontinent of BM and urine. Cleaned pt and applied a clean brief and sheets. ?

## 2021-06-09 NOTE — ED Notes (Signed)
Patient repositioned and appropriate sized cuff applied and BP re-cycled. ?

## 2021-06-10 DIAGNOSIS — T85528A Displacement of other gastrointestinal prosthetic devices, implants and grafts, initial encounter: Secondary | ICD-10-CM | POA: Diagnosis not present

## 2021-06-10 LAB — CBC
HCT: 35.6 % — ABNORMAL LOW (ref 39.0–52.0)
Hemoglobin: 11.1 g/dL — ABNORMAL LOW (ref 13.0–17.0)
MCH: 24.8 pg — ABNORMAL LOW (ref 26.0–34.0)
MCHC: 31.2 g/dL (ref 30.0–36.0)
MCV: 79.5 fL — ABNORMAL LOW (ref 80.0–100.0)
Platelets: 296 10*3/uL (ref 150–400)
RBC: 4.48 MIL/uL (ref 4.22–5.81)
RDW: 17.6 % — ABNORMAL HIGH (ref 11.5–15.5)
WBC: 9.5 10*3/uL (ref 4.0–10.5)
nRBC: 0 % (ref 0.0–0.2)

## 2021-06-10 LAB — BASIC METABOLIC PANEL
Anion gap: 6 (ref 5–15)
BUN: 22 mg/dL — ABNORMAL HIGH (ref 6–20)
CO2: 24 mmol/L (ref 22–32)
Calcium: 9.6 mg/dL (ref 8.9–10.3)
Chloride: 112 mmol/L — ABNORMAL HIGH (ref 98–111)
Creatinine, Ser: 0.91 mg/dL (ref 0.61–1.24)
GFR, Estimated: >60 mL/min (ref >60)
Glucose, Bld: 109 mg/dL — ABNORMAL HIGH (ref 70–99)
Potassium: 3.4 mmol/L — ABNORMAL LOW (ref 3.5–5.1)
Sodium: 142 mmol/L (ref 135–145)

## 2021-06-10 LAB — GLUCOSE, CAPILLARY
Glucose-Capillary: 105 mg/dL — ABNORMAL HIGH (ref 70–99)
Glucose-Capillary: 117 mg/dL — ABNORMAL HIGH (ref 70–99)

## 2021-06-10 LAB — PHOSPHORUS: Phosphorus: 4.8 mg/dL — ABNORMAL HIGH (ref 2.5–4.6)

## 2021-06-10 LAB — MAGNESIUM
Magnesium: 2 mg/dL (ref 1.7–2.4)
Magnesium: 2 mg/dL (ref 1.7–2.4)

## 2021-06-10 MED ORDER — OSMOLITE 1.5 CAL PO LIQD
1000.0000 mL | ORAL | Status: DC
  Filled 2021-06-10: qty 1000

## 2021-06-10 MED ORDER — PROSOURCE TF PO LIQD
45.0000 mL | Freq: Two times a day (BID) | ORAL | Status: DC
  Administered 2021-06-10: 45 mL
  Filled 2021-06-10 (×2): qty 45

## 2021-06-10 MED ORDER — POLYETHYLENE GLYCOL 3350 17 G PO PACK
17.0000 g | PACK | Freq: Every day | ORAL | Status: DC
  Administered 2021-06-10: 17 g
  Filled 2021-06-10: qty 1

## 2021-06-10 MED ORDER — POLYETHYLENE GLYCOL 3350 17 G PO PACK: 17.0000 g | PACK | Freq: Every day | ORAL | 0 refills | Status: DC | PRN

## 2021-06-10 MED ORDER — PROSOURCE TF PO LIQD: 45.0000 mL | Freq: Every day | ORAL | Status: DC

## 2021-06-10 NOTE — Progress Notes (Signed)
Family Medicine Teaching Service ?Daily Progress Note ?Intern Pager: [phone] ? ?Patient name: Stephen Delgado Medical record number: [id number] ?Date of birth: 17-May-1965 Age: 56 y.o. Gender: male ? ?Primary Care Provider: Haydee Salter, MD ?Consultants: General surgery, IR ?Code Status: Full Code  ? ?Pt Overview and Major Events to Date:  ?06/08/2021: Intial encounter + G-tube exchange ? ? ?Assessment and Plan: ?Stephen Delgado is a 56 y.o. 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 ?Had recent replacement in [facility] on 5/1. ?HD 1  ? ?G-tube malfunction s/p exchange 22 French PEG tube (failed 24 tube) 5/9 ?Tube in place, clean, no leaking, functional. ?Gen surg, IR, RD following ?IR tube replacement 5/9 ? ?Vegetative state with trach- & G-tube dependence 2/2 b/l embolic shower stroke + [facility] 12/2020  ?Pressure wound ppx ?Does not appear to be in distress. Eyes closed. ?Trach-collar 5L 21% FiO2.  ?Home: Robaxin 500 mg 3 times daily, continued ?Turn patient, sacral foam dressing, heel protectors, air mattress (unsure of how to order) ? ?GOC ?See's outpatient palliative.  ? 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 ? ?DM2 ?A1c 6.1 (06/02/2021).   ?IP CBG goal <180  ?CGBs q4hrs & no SSI ?Basal: None ? ?CKD 3, stable ?Cr 0.91  ? ? ?FEN/GI: Per tube ?PPx: heparin injection 5,000 Units Start: 06/08/21 2230  ? ? ?Dispo: Home with existing services ?Barrier: None ? ? ?Subjective:  ?naeon ? ?Wife at bedside. ? ?Patient did not appear to be in distress. Eyes closed, protecting airways. ? ?Objective: ?Temp:   [98.5 ?F (36.9 ?C)-99.4 ?F (37.4 ?C)] 99.1 ?F (37.3 ?C) (05/10 8453) ?Pulse Rate:  [84-96] 89 (05/10 0933) ?Resp:  [16-24] 16 (05/10 0933) ?BP: (112-188)/(69-106) 129/69 (05/10 0933) ?SpO2:  [96 %-100 %] 98 % (05/10 0933) ?FiO2 (%):  [21 %] 21 % (05/10 0729) ?Weight:  [109.3 kg] 109.3 kg (05/10 0537) ? ?Physical exam ?General appearance: Nonresponsive, no sedation. ?HEENT: Eyes closed, eyes disconjugate. No excessive sialorrhea ?CV: regular rate, regular rhythm ?Pulm: Grossly no rales bilaterally, difficult to appreciate due to trachcollar ?Extremities: Bilateral upper extremity contracted medially.  Bilateral lower extremity 2+ pitting edema up to the ankle. ?Cranial nerves: Corneal is intact, ocular vestibular intact, does not blink to threat, no obvious facial drooping, cough intact, eyes disconjugated  ?Sensory/motor: Bilateral lower extremity extends to noxious stimuli, with right side stronger than left side. Upper extremity are contracted, does not appreciate movement to stimuli.  ?Reflexes: No Babinski's-mute ? ?MSE ?Alert: Not alert or awake.  Extends bilateral lower extremity to noxious stimuli, possibly posturing, difficult to tell. Nonpurposeful movement. Eyes closed.  ?Language: Noncommunicative, nonverbal.  Trach dependent.  Does not follow commands. ? ?Laboratory: ?Recent Labs  ?Lab 06/08/21 ?1937 06/09/21 ?0300 06/10/21 ?0322  ?WBC 11.7* 12.7* 9.5  ?HGB 10.9* 10.3* 11.1*  ?HCT 35.7* 33.8* 35.6*  ?PLT 307 313 296  ? ?Recent Labs  ?Lab 06/08/21 ?1937 06/09/21 ?0300 06/10/21 ?0322  ?NA 138 141 142  ?K 3.7 3.4* 3.4*  ?CL 104 106 112*  ?CO2 24 25 24   ?BUN 33* 30* 22*  ?CREATININE  0.95 0.89 0.91  ?CALCIUM 9.8 9.7 9.6  ?GLUCOSE 167* 142* 109*  ? ? ?Imaging/Diagnostic Tests: ? ?No results found.  ? ?Princess Bruins, DO ?06/10/2021, 11:01 AM ?PGY-1, [facility] ?FPTS Intern pager: [phone], text pages welcome  ?

## 2021-06-10 NOTE — Hospital Course (Addendum)
Stephen Delgado is a 56 y.o. 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 Of note, had previous recent replacement in [facility] on 5/1.  ? ?G-tube malfunction s/p exchange 22 French PEG tube (failed 24 tube) 06/09/2021 ?While admitted, IR replaced G-tube at bedside 5/09. Tolerated well with no adverse events. The G-tube flushed well and was able to successfully handle his home medications. Patient was discharged home in stable condition. Recommend close PCP follow up. No medication changes made while admitted. Discharged on home meds.  ? 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 12/2020 , Pressure wound ppx ?At baseline patient is non-communicative, comatose without sedation. Corneal, gag, dolls eye are intact. Left sided weakness. ?Sees outpatient palliative care. Family takes care of patient with multiple resources. No additional assistance needed.  ? 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 ?PCP f/u ?Palliative outpatient and discussion on GOC ?HTN, stable currently on home meds: amlodipine 10 mg daily, Coreg 12.5 mg twice daily, clonidine patch once weekly, Isordil 20 mg 3 times daily, losartan 50 mg daily ? ?Procedures ?G-tube exchanged by IR per above ?

## 2021-06-10 NOTE — Discharge Summary (Signed)
Family Medicine Teaching Service ?Hospital Discharge Summary ? ?Patient name: Stephen Delgado Medical record number: [id number] ?Date of birth: 1965/09/19 Age: 56 y.o. Gender: male ?Date of Admission: 06/08/2021  Date of Discharge: 06/10/2021 ?Admitting Physician: Gerrit Heck, MD ? ?Primary Care Provider: Elwin Mocha, MD ?Consultants: IR, Gen surg ? ?Indication for Hospitalization:  ?G-tube malfunction  ? ?Discharge Diagnoses/Problem List:  ?Principal Problem: ?  Dislodged gastrostomy tube ?Active Problems: ?  Primary hypertension ?  Tracheostomy dependent ([place]) ?  Cerebrovascular accident (CVA) ([place]) ?  ? ?Disposition:  ?Home, returning to home services ? ?Discharge Condition:  ?Stable  ? ?Discharge Exam:  ?Temp:  [98.5 ?F (36.9 ?C)-99.4 ?F (37.4 ?C)] 98.8 ?F (37.1 ?C) (05/10 1142) ?Pulse Rate:  [84-99] 99 (05/10 1142) ?Resp:  [16-24] 16 (05/10 0933) ?BP: (112-188)/(69-104) 153/90 (05/10 1142) ?SpO2:  [96 %-100 %] 96 % (05/10 1142) ?FiO2 (%):  [21 %] 21 % (05/10 1143) ?Weight:  [109.3 kg] 109.3 kg (05/10 0537)  ?Physical exam ?General appearance: Nonresponsive, no sedation. ?HEENT: Eyes closed, eyes disconjugate. No excessive sialorrhea ?CV: regular rate, regular rhythm ?Pulm: Grossly no rales bilaterally, difficult to appreciate due to trachcollar ?Extremities: Bilateral upper extremity contracted medially.  Bilateral lower extremity 2+ pitting edema up to the ankle. ?Cranial nerves: Corneal is intact, ocular vestibular intact, does not blink to threat, no obvious facial drooping, cough intact, eyes disconjugated  ?Sensory/motor: Bilateral lower extremity extends to noxious stimuli, with right side stronger than left side. Upper extremity are contracted, does not appreciate movement to stimuli.  ?Reflexes: No Babinski's-mute ?  ?MSE ?Alert: Not alert or awake.  Extends bilateral lower extremity to noxious stimuli, possibly posturing, difficult to tell. Nonpurposeful movement. Eyes closed.  ?Language:  Noncommunicative, nonverbal.  Trach dependent.  Does not follow commands. ?  ? ?Brief Hospital Course:  ?Stephen Delgado is a 56 y.o. 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 Of note, had previous recent replacement in [facility] on 5/1.  ? ?G-tube malfunction s/p exchange 22 French PEG tube (failed 24 tube) 06/09/2021 ?While admitted, IR replaced G-tube at bedside 5/09. Tolerated well with no adverse events. The G-tube flushed well and was able to successfully handle his home medications. Patient was discharged home in stable condition. Recommend close PCP follow up. No medication changes made while admitted. Discharged on home meds.  ? 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 Corneal, gag, dolls eye are intact. Left sided weakness. ?Sees outpatient palliative care. Family takes care of patient with multiple resources. No additional assistance needed.  ? 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 ?PCP f/u ?Palliative outpatient and discussion on GOC ?HTN, stable currently on home meds: amlodipine 10 mg daily, Coreg 12.5 mg twice daily, clonidine patch once weekly, Isordil 20 mg 3 times daily, losartan 50 mg daily ? ?Procedures ?G-tube exchanged by IR per above ? ? ?Significant Labs and Imaging:  ?Recent Labs  ?Lab 06/08/21 ?1937 06/09/21 ?0300 06/10/21 ?0322  ?WBC 11.7* 12.7* 9.5  ?HGB 10.9* 10.3* 11.1*  ?HCT 35.7* 33.8* 35.6*  ?PLT 307 313 296  ? ?Recent Labs  ?Lab 06/08/21 ?1937 06/09/21 ?0300 06/10/21 ?0322 06/10/21 ?1034  ?NA 138 141 142  --   ?  K 3.7 3.4* 3.4*  --   ?CL 104 106 112*  --   ?CO2 24 25 24   --   ?GLUCOSE 167* 142* 109*  --    ?BUN 33* 30* 22*  --   ?CREATININE 0.95 0.89 0.91  --   ?CALCIUM 9.8 9.7 9.6  --   ?MG  --   --  2.0 2.0  ?PHOS  --   --   --  4.8*  ? ?  ?CT ABDOMEN WO CONTRAST ? ?Result Date: 06/08/2021 ?CLINICAL DATA:  Malposition gastrostomy tube EXAM: CT ABDOMEN WITHOUT CONTRAST TECHNIQUE: Multidetector CT imaging of the abdomen was performed following the standard protocol without IV contrast. RADIATION DOSE REDUCTION: This exam was performed according to the departmental dose-optimization program which includes automated exposure control, adjustment of the mA and/or kV according to patient size and/or use of iterative reconstruction technique. COMPARISON:  Radiograph 06/08/2021, CT 05/04/2021 FINDINGS: Lower chest: Lung bases demonstrate no acute consolidation or effusion. Mild cardiomegaly. Hepatobiliary: Cyst within the central hepatic dome, no follow-up imaging recommended. No calcified gallstone. No biliary dilatation. Pancreas: Unremarkable. No pancreatic ductal dilatation or surrounding inflammatory changes. Spleen: Normal in size without focal abnormality. Adrenals/Urinary Tract: Adrenal glands are normal. Excreted contrast within the kidneys. No hydronephrosis. Stomach/Bowel: Malposition percutaneous gastrostomy tube. The radiopaque balloon appears position within the abdominal wall, slightly deep to the rectus muscle. There is a 14 x 3 cm contrast collection superficial to the peritoneal cavity. Vascular/Lymphatic: No suspicious lymph nodes.  No aneurysm Other: No free air Musculoskeletal: No acute osseous abnormality IMPRESSION: Malposition percutaneous gastrostomy tube with balloon and tip visible in the left anterior abdominal wall, deep to the left rectus muscle. There is contrast collecting within the left anterior abdominal wall, superficial to the peritoneal cavity. Critical Value/emergent results were called by telephone at the time of interpretation on 06/08/2021 at 7:46 pm to provider [facility] , who  verbally acknowledged these results. Electronically Signed   By: Donavan Foil M.D.   On: 06/08/2021 19:46  ? ?CT HEAD WO CONTRAST (5MM) ? ?Result Date: 05/14/2021 ?CLINICAL DATA:  Acute neuro deficit rule out stroke. Altered mental status EXAM: CT HEAD WITHOUT CONTRAST TECHNIQUE: Contiguous axial images were obtained from the base of the skull through the vertex without intravenous contrast. RADIATION DOSE REDUCTION: This exam was performed according to the departmental dose-optimization program which includes automated exposure control, adjustment of the mA and/or kV according to patient size and/or use of iterative reconstruction technique. COMPARISON:  CT head 05/13/2021 FINDINGS: Brain: Moderate atrophy. Extensive chronic ischemic changes with hypodense chronic infarcts in the basal ganglia and white matter bilaterally. Chronic infarcts in the cerebellum bilaterally. Negative for acute infarct, hemorrhage, mass. Image quality degraded by motion. Vascular: Negative for hyperdense vessel. Linear calcification left sylvian fissure most likely left middle cerebral artery calcification, unchanged from the prior study. Skull: No acute abnormality Sinuses/Orbits: Near complete opacification left maxillary sinus unchanged. Mucosal edema sphenoid sinus unchanged. Negative orbit Other: None IMPRESSION: Motion degraded study Atrophy and severe chronic ischemic changes. No acute abnormality no change from the prior CT. Electronically Signed   By: Franchot Gallo M.D.   On: 05/14/2021 18:29  ? ?CT HEAD WO CONTRAST (5MM) ? ?Result Date: 05/13/2021 ?CLINICAL DATA:  Stroke follow-up. EXAM: CT HEAD WITHOUT CONTRAST TECHNIQUE: Contiguous axial images were obtained from the base of the skull through the vertex without intravenous contrast. RADIATION DOSE REDUCTION: This exam was performed according to the departmental dose-optimization program which includes automated  exposure control, adjustment of the mA and/or kV according to  patient size and/or use of iterative reconstruction technique. COMPARISON:  01/23/2021. FINDINGS: Brain: Examination is limited due to motion artifact. No acute intracranial hemorrhage, midline shift or mass effect. N

## 2021-06-10 NOTE — TOC Transition Note (Signed)
Transition of Care (TOC) - CM/SW Discharge Note ? ? ?Patient Details  ?Name: Stephen Delgado ?MRN: [id number] ?Date of Birth: 23-Aug-1965 ? ?Transition of Care (TOC) CM/SW Contact:  ?Tom-Johnson, Hershal Coria, RN ?Phone Number: ?06/10/2021, 12:59 PM ? ? ?Clinical Narrative:    ? ?Patient is scheduled for discharge today. Admitted for Dislodged G-Tube.  ?From home with wife who is his primary care giver. Active with [facility] for home health RN. Will resume care at discharge. Gets his Janina Mayo supplies from [employer].  ?[facility] scheduled for transportation and ETA is 2pm-3pm. No further TOC needs noted.  ? ?Final next level of care: Home w Home Health Services ?Barriers to Discharge: Barriers Resolved ? ? ?Patient Goals and CMS Choice ?Patient states their goals for this hospitalization and ongoing recovery are:: To return home with wife. ?CMS Medicare.gov Compare Post Acute Care list provided to:: Patient Represenative (must comment) (Spouse, [name].) ?Choice offered to / list presented to : Spouse ? ?Discharge Placement ?  ?           ?  ?Patient to be transferred to facility by: [facility] ?  ?  ? ?Discharge Plan and Services ?  ?  ?           ?DME Arranged: N/A ?DME Agency: [employer] ?  ?  ?  ?HH Arranged: RN ?HH Agency: [facility] (Resumption of care.) ?Date HH Agency Contacted: 06/10/21 ?Time HH Agency Contacted: 1245 ?Representative spoke with at [facility] Agency: Kandee Keen ? ?Social Determinants of Health (SDOH) Interventions ?  ? ? ?Readmission Risk Interventions ? ?  02/20/2021  ?  9:31 AM  ?Readmission Risk Prevention Plan  ?Transportation Screening Complete  ?Medication Review Oceanographer) Complete  ?PCP or Specialist appointment within 3-5 days of discharge Not Complete  ?PCP/Specialist Appt Not Complete comments Patient will need LTC placement at SNF facility.  ?HRI or Home Care Consult Complete  ?SW Recovery Care/Counseling Consult Complete  ?Palliative Care Screening Complete  ?Skilled Nursing Facility  Complete  ? ? ? ? ? ?

## 2021-06-10 NOTE — Progress Notes (Signed)
FPTS Brief Progress Note ? ?S: Patient resting ? ? ?O: ?BP (!) 149/93 (BP Location: Left Arm)   Pulse 96   Temp 98.9 ?F (37.2 ?C) (Axillary)   Resp 20   SpO2 98%   ? ? ?A/P: ?- Orders reviewed. Labs for AM ordered, which was adjusted as needed.  ? ?Gladys Damme, MD ?06/10/2021, 3:09 AM ?PGY-3, [facility] Night Resident  ?Please page [phone] with questions.  ? ? ?

## 2021-06-10 NOTE — Plan of Care (Signed)

## 2021-06-10 NOTE — Progress Notes (Signed)
DISCHARGE NOTE HOME ?[name] Popp to be discharged Home per MD order. Discussed prescriptions and follow up appointments with patients wife. Prescriptions given to patients wife; medication list explained in detail. Patients wife verbalized understanding. ? ?Skin clean, dry and intact without evidence of skin break down, no evidence of skin tears noted. IV catheter discontinued intact. Site without signs and symptoms of complications. Dressing and pressure applied. Pt denies pain at the site currently. No complaints noted. ? ?Patient free of lines, drains, and wounds.  ? ?An After Visit Summary (AVS) was printed and given to patients wife. ?Patient escorted via stretcher, and discharged home via [facility]. ? ?Myrtis Hopping, RN  ?

## 2021-06-10 NOTE — Procedures (Signed)
Tracheostomy Change Note ? ?Patient Details:   ?Name: Stephen Delgado ?DOB: September 16, 1965 ?MRN: [id number] ?   ?Airway Documentation: ?   ? ?Evaluation ? O2 sats: stable throughout ?Complications: No apparent complications ?Patient did tolerate procedure well. ?Bilateral Breath Sounds: Diminished, Clear ?Positive color change on ET CO2 detector. ?  ? ?Stephen Delgado ?06/10/2021, 11:44 AM ?

## 2021-06-10 NOTE — Progress Notes (Signed)
Initial Nutrition Assessment ? ?DOCUMENTATION CODES:  ? ?Not applicable ? ?INTERVENTION:  ? ?Tube Feeding via G-tube: ?Continue Osmolite 1.5 (or comparable formula) at rate of 60 ml/hr ?Pro-Source TF 45 mL daily ?This provides 108 g of protein, 2200 kcals, 1094 mL free water ? ?Additional free water of  250 mL q 6 hours to meet hydration needs ? ? ?NUTRITION DIAGNOSIS:  ? ?Inadequate oral intake related to inability to eat as evidenced by NPO status. ? ?GOAL:  ? ?Patient will meet greater than or equal to 90% of their needs ? ?MONITOR:  ? ?TF tolerance, Labs, Weight trends ? ?REASON FOR ASSESSMENT:  ? ?Consult ?Enteral/tube feeding initiation and management ? ?ASSESSMENT:  ? ?56 yo male admitted with G-tube malfunction. 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 ?Noted discharge orders in place for discharge home today.  ? ?Family cares for pt at home.Spoke with Son via phone.  ? ?Per home med list, pt on Osmolite 1.5 formula . PT also with order for Pro-source TF which was not despensed by [facility] ? ?Son indicates that pt receives continuous TF at rate of 60 ml/hr at home-he believes it is Osmolite 1.5 or something comparable, issues with obtaining TF formula due to shortage. TF only interrupted during pt care-cleaning him up, etc. Son also reports pt receives protein modular at home, 1x per day ? ?Son reports pt has been tolerating TF well; Pt normally has BM every other day; recently went several days without BM but that has resolved.  ? ?Current wt 109.3 kg. Son believes weight has been stable.  ? ?Per skin documentation; pt does not have pressure injury at present per MD note, "mild erosion" only present on L buttocks ? ?Labs: potassium 3.4 (L), Creatinine wdl, CBGs 105-138, HGbA1c 6.1 ?Meds: miralax ? ? ?NUTRITION - FOCUSED PHYSICAL EXAM: ? ?Unable to assess ? ?Diet Order:   ?Diet Order   ? ?       ?  Diet  general       ?  ?  Diet NPO time specified  Diet effective now       ?  ? ?  ?  ? ?  ? ? ?EDUCATION NEEDS:  ? ?Not appropriate for education at this time ? ?Skin:  Skin Integrity Issues:: Other (Comment) ?Other: "mild erosion on L buttocks" per MD assessment, no pressure injury ? ?Last BM:  PTA ? ?Height:  ? ?Ht Readings from Last 1 Encounters:  ?01/23/21 6' (1.829 m)  ? ? ?Weight:  ? ?Wt Readings from Last 1 Encounters:  ?06/10/21 109.3 kg  ? ? ?BMI:  Body mass index is 32.69 kg/m?. ? ?Estimated Nutritional Needs:  ? ?Kcal:  2100-2300 kcals ? ?Protein:  105-120 g ? ?Fluid:  >/= 2L ? ? ?Romelle Starcher MS, RDN, LDN, CNSC ?Registered Dietitian III ?Clinical Nutrition ?RD Pager and On-Call Pager Number Located in [place]  ? ?

## 2021-06-10 NOTE — Discharge Instructions (Addendum)
Dear Stephen Delgado, ? ?Thank you for letting us participate in your care. You were hospitalized for a Dislodged gastrostomy tube. It was replaced by our interventional radiology team.  ? ?POST-HOSPITAL & CARE INSTRUCTIONS ?Please make a follow up appointment with your primary care doctor.  ?We did not make any changes to your home medicines.  ?Go to your follow up appointments (listed below) ? ?DOCTOR'S APPOINTMENT   ?Future Appointments  ?Date Time Provider Department Center  ?06/19/2021  1:00 PM [facility] TECH [facility] None  ? ? Follow-up Information   ? ? Surgery, [place]. Call.   ?Specialty: General Surgery ?Why: As needed if questions regarding feeding tube. ?Contact information: ?[address] ?[address] ?[place] [address] ?[phone] ? ? ?  ?  ? ? Haydee Salter, MD. Schedule an appointment as soon as possible for a visit in 1 week(s).   ?Specialty: Family Medicine ?Why: Please make a hospital follow up appointment with your primary care provider. It should be within a week of hospital discharge. ?Contact information: ?[address] ?[place] [address] ?[phone] ? ? ?  ?  ? ?  ?  ? ?  ? ? ?Take care and be well! ? ?Family Medicine Teaching Service Inpatient Team ?[facility]  ?[facility]  ?[address] [place], [place] [address] ?[phone] ?

## 2021-06-11 ENCOUNTER — Telehealth: Payer: Self-pay | Admitting: Nurse Practitioner

## 2021-06-11 NOTE — Telephone Encounter (Signed)
Attempted to contact wife to reschedule the Palliative Consult, no answer - left VM requesting return call to reschedule.  Patient was in the hospital at the time of the previously scheduled Consult.   ?

## 2021-06-13 ENCOUNTER — Other Ambulatory Visit: Payer: Self-pay

## 2021-06-15 ENCOUNTER — Other Ambulatory Visit (HOSPITAL_COMMUNITY): Payer: Self-pay

## 2021-06-15 ENCOUNTER — Other Ambulatory Visit: Payer: Self-pay | Admitting: Family Medicine

## 2021-06-15 ENCOUNTER — Telehealth: Payer: Self-pay

## 2021-06-15 MED ORDER — POLYMYXIN B-TRIMETHOPRIM 10000-0.1 UNIT/ML-% OP SOLN
2.0000 [drp] | OPHTHALMIC | 0 refills | Status: DC
  Filled 2021-06-15: qty 10, 13d supply, fill #0
  Filled 2021-06-16: qty 10, 16d supply, fill #0

## 2021-06-15 MED ORDER — POLYMYXIN B-TRIMETHOPRIM 10000-0.1 UNIT/ML-% OP SOLN
2.0000 [drp] | OPHTHALMIC | 0 refills | Status: DC
  Filled 2021-06-15: qty 10, 17d supply, fill #0

## 2021-06-15 NOTE — Telephone Encounter (Signed)
HH nurse reports possible eye infection. Will trial meds and get f/u appt set with family in their home. ? ?Haydee Salter, MD ? ?

## 2021-06-16 ENCOUNTER — Other Ambulatory Visit (HOSPITAL_COMMUNITY): Payer: Self-pay

## 2021-06-16 ENCOUNTER — Other Ambulatory Visit: Payer: Self-pay | Admitting: Family Medicine

## 2021-06-17 ENCOUNTER — Other Ambulatory Visit: Payer: Self-pay | Admitting: Nurse Practitioner

## 2021-06-17 ENCOUNTER — Telehealth: Payer: Self-pay | Admitting: Nurse Practitioner

## 2021-06-17 ENCOUNTER — Other Ambulatory Visit (HOSPITAL_COMMUNITY): Payer: Self-pay

## 2021-06-17 ENCOUNTER — Encounter: Payer: Self-pay | Admitting: Nurse Practitioner

## 2021-06-17 DIAGNOSIS — Z515 Encounter for palliative care: Secondary | ICD-10-CM

## 2021-06-17 DIAGNOSIS — R5381 Other malaise: Secondary | ICD-10-CM

## 2021-06-17 MED ORDER — ISOSORBIDE DINITRATE 20 MG PO TABS
20.0000 mg | ORAL_TABLET | Freq: Three times a day (TID) | ORAL | 0 refills | Status: DC
  Filled 2021-06-17: qty 60, 20d supply, fill #0
  Filled 2021-06-17 – 2021-07-08 (×2): qty 270, 90d supply, fill #0

## 2021-06-17 MED ORDER — CLONIDINE 0.2 MG/24HR TD PTWK
0.2000 mg | MEDICATED_PATCH | TRANSDERMAL | 0 refills | Status: DC
  Filled 2021-06-17: qty 12, 84d supply, fill #0

## 2021-06-17 MED ORDER — BLOOD GLUCOSE MONITOR SYSTEM W/DEVICE KIT
PACK | Freq: Four times a day (QID) | 0 refills | Status: DC
  Filled 2021-06-17: qty 1, fill #0

## 2021-06-17 MED ORDER — METHOCARBAMOL 500 MG PO TABS
500.0000 mg | ORAL_TABLET | Freq: Three times a day (TID) | ORAL | 0 refills | Status: DC
  Filled 2021-06-17: qty 90, 30d supply, fill #0

## 2021-06-17 MED ORDER — LOSARTAN POTASSIUM 50 MG PO TABS
50.0000 mg | ORAL_TABLET | Freq: Every day | ORAL | 0 refills | Status: DC
  Filled 2021-06-17: qty 90, 90d supply, fill #0

## 2021-06-17 MED ORDER — ONETOUCH DELICA PLUS LANCET33G MISC
Freq: Four times a day (QID) | 0 refills | Status: DC
  Filled 2021-06-17: qty 300, 75d supply, fill #0

## 2021-06-17 MED ORDER — CARVEDILOL 12.5 MG PO TABS
12.5000 mg | ORAL_TABLET | Freq: Two times a day (BID) | ORAL | 0 refills | Status: DC
  Filled 2021-06-17: qty 180, 90d supply, fill #0

## 2021-06-17 MED ORDER — ONETOUCH VERIO VI STRP
ORAL_STRIP | Freq: Four times a day (QID) | 0 refills | Status: DC
  Filled 2021-06-17: qty 350, 88d supply, fill #0

## 2021-06-17 MED ORDER — AMLODIPINE BESYLATE 10 MG PO TABS
10.0000 mg | ORAL_TABLET | Freq: Every day | ORAL | 0 refills | Status: DC
Start: 2021-06-17 — End: 2021-09-23
  Filled 2021-06-17: qty 90, 90d supply, fill #0

## 2021-06-17 NOTE — Progress Notes (Signed)
? ? ?Manufacturing engineer ?Community Palliative Care Consult Note ?Telephone: [phone]  ?Fax: [phone]  ? ?Date of encounter: 06/17/21 ?3:03 PM ?PATIENT NAME: Stephen Delgado ?[address] ?[place] [address]   ?[phone] (home)  ?DOB: 05/31/1965 ?MRN: [id number] ?PRIMARY CARE PROVIDER:    ?Elwin Mocha, MD,  ?[place] ?[place] [address] ?[phone] ? ?REFERRING PROVIDER:   ?Elwin Mocha, MD ?[address] ?[place],  [place] [address] ?[phone] ? ?RESPONSIBLE PARTY:    ?Contact Information   ? ? Name Relation Home Work Mobile  ? Humphreys,Joseph Son   [phone]  ? Wickwire,[name] Spouse [phone]    ? Dobosz,Richard Brother   [phone]  ? Tagoe,Eric Other   [phone]  ? ?  ? ? ?Due to the COVID-19 crisis, this visit was done via telemedicine from my office and it was initiated and consent by this patient and or family. ?I connected with Mrs Needles with Taylor Brodersen Vel[facility] OR PROXY on 06/17/21 by a telephone as video not available enabled telemedicine application and verified that I am speaking with the correct person using two identifiers. ?I discussed the limitations of evaluation and management by telemedicine. The patient expressed understanding and agreed to proceed. Palliative Care was asked to follow this patient by consultation request of  Elwin Mocha, MD to address advance care planning and complex medical decision making. This is the initial visit.                           ?ASSESSMENT AND PLAN / RECOMMENDATIONS:  ?Advance Care Planning/Goals of Care: Goals include to maximize quality of life and symptom management. Patient/health care surrogate gave his/her permission to discuss.Our advance care planning conversation included a discussion about:    ?The value and importance of advance care planning  ?Experiences with loved ones who have been seriously ill or have died  ?Exploration of personal, cultural or spiritual beliefs that might  influence medical decisions  ?Exploration of goals of care in the event of a sudden injury or illness  ?Identification  of a healthcare agent  ?Review and updating or creation of an  advance directive document . ?Decision not to resuscitate or to de-escalate disease focused treatments due to poor prognosis. ?CODE STATUS: Full code ? ?Symptom Management/Plan: ?1. Advance Care Planning; Full code ? ?2. Goals of Care: Goals include to maximize quality of life and symptom management. Our advance care planning conversation included a discussion about:    ?The value and importance of advance care planning  ?Exploration of personal, cultural or spiritual beliefs that might influence medical decisions  ?Exploration of goals of care in the event of a sudden injury or illness  ?Identification and preparation of a healthcare agent  ?Review and updating or creation of an advance directive document. ? ?3. Debility secondary to embolic cva, discussed limitations, and barriers as Mr Cerniglia is total care. We talked about daily routine, currently receiving assistance from outside caregivers. Encouraged rom ? ?4. Palliative care encounter; Palliative care encounter; Palliative medicine team will continue to support patient, patient's family, and medical team. Visit consisted of counseling and education dealing with the complex and emotionally intense issues of symptom management and palliative care in the setting of serious and potentially life-threatening illness ? ?Follow up Palliative Care Visit: Palliative care will continue to follow for complex medical decision making, advance care planning, and clarification of goals. Return 4 weeks or prn. ? ?  I spent 40 minutes providing this consultation. More than 50% of the time in this consultation was spent in counseling and care coordination. ?PPS: 30% ?Chief Complaint: Initial palliative consult for complex medical decision making ? ?HISTORY OF PRESENT ILLNESS:  Stephen Delgado is a 56  y.o. 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 Recent hospitalization from 06/08/2021 to 06/10/2021 for g-tube dislodged which was replaced in IR, tolerated well. I called Ms. Fildes by telephone for telephone telemedicine as video not available for initial pc visit. Ms. Kuehler and I talked about the last time Mr. Clausen was independent which was prior to his embolic stroke [id number]. 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 Ms. Mccullagh endorses they have 2 adult children who helps her care for Mr Foskey at home, they have been married greater than 30 years. Ms. Carelock is returning to work this Friday, Mr. Zupon has caregivers who stay with him. Ms. Score endorses Mr Henken does blink his eyes otherwise is not able to communicate. Ms. Retterer endorses she believes he can hear what is being said to him. Ms. Shiels endorses Mr. Direnzo requires to be turned, positioned, propped with pillows, he is fed through g-tube without difficulty. Ms. Weatherman endorses she is comfortable caring for g-tube and trach. She has not had to suction him as much as prior to last hospitalization when his trach was changed out. We talked about daily routine, past medical history, quality of life. We talked about medical goals including code status. Currently Mr Aguillar is a full code with aggressive interventions. Ms. Lavanway is open to further discussions about code status though wishes to leave a full code at present time. We talked about role pc in poc. Discussed will have PC RN make a visit in 2 weeks in person then NP 4 weeks following. Ms. Dulin endorses Mr. Bo does not have any wounds or sores. We talked about caregiver stress, fatigue, with coping strategies. Ms. Boomer and I talked about challenges she faces daily. Therapeutic listening emotional  support provided. Questions answered.  ? ?History obtained from review of EMR, discussion with rs Sokolowski with Mr. Florentine.  ?I reviewed available labs, medications, imaging, studies and related documents from the EMR.  Records reviewed and summarized above.  ? ?ROS ?10 point system reviewed with Mrs. Duddy all negative except HPI ? ?Physical Exam: ?deferred ?CURRENT PROBLEM LIST:  ?Patient Active Problem List  ? Diagnosis Date Noted  ? Dislodged gastrostomy tube 06/08/2021  ? Vomiting in adult   ? Hypophosphatemia   ? Metabolic acidosis   ? Ileus with hypokalemia 04/30/2021  ? Infection due to Enterobacter cloacae   ? UTI (urinary tract infection) with pyuria   ? Fever in adult   ? Anemia 04/26/2021  ? Acute conjunctivitis, bilateral 04/08/2021  ? Pressure injury of skin 04/06/2021  ? Diarrhea   ? Hypernatremia 03/28/2021  ? Protein-calorie malnutrition, severe 03/26/2021  ? Constipation 03/20/2021  ? Nontraumatic intracerebral hemorrhage (HCC)   ? Cerebrovascular accident (CVA) ([place]) 03/04/2021  ? Protein-calorie malnutrition/dysphagia due to recent stroke/constipation 03/04/2021  ? Transaminitis   ? Acute bacterial sinusitis   ? Muscular hypertonicity   ? Fever 2/2 Enterobacter UTI   ? Intractable hiccups   ? 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 Stage 3b chronic kidney disease (CKD) ([place]) 01/23/2021  ?  Acute kidney injury on CKD 3b/acute azotemia with mild hypernatremia 01/23/2021  ? 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 hypernatremia 01/23/2021  ? Dysphagia due to recent stroke 01/23/2021  ? Acute respiratory failure w/tracheostomy dependence/recurrent Serratia pneumonia   ? Diabetes mellitus type 2 in obese ([place]) 02/28/2016  ? Encounter for end of life care 02/08/2016  ? Sinus congestion 04/28/2015  ? Diabetes mellitus ([place]) 04/28/2015  ? Primary hypertension 04/19/2015  ? Abnormal EKG 04/19/2015  ? Chronic kidney disease (CKD) 04/19/2015  ? Acute  hypokalemia 04/19/2015  ? Microcytosis 04/19/2015  ? ?PAST MEDICAL HISTORY:  ?Active Ambulatory Problems  ?  Diagnosis Date Noted  ? Primary hypertension 04/19/2015  ? Abnormal EKG 04/19/2015  ? Chronic kidney dise

## 2021-06-17 NOTE — Telephone Encounter (Signed)
Spoke with patient's wife [name] and have rescheduled the Palliative Consult for today, 06/17/21 @ 3 PM, due to patient was in the hospital for the previously scheduled appointment on 06/09/21. ?

## 2021-06-18 ENCOUNTER — Other Ambulatory Visit (HOSPITAL_COMMUNITY): Payer: Self-pay

## 2021-06-18 ENCOUNTER — Ambulatory Visit: Payer: Commercial Managed Care - HMO | Admitting: Family Medicine

## 2021-06-18 VITALS — HR 92 | Temp 97.7°F

## 2021-06-18 MED ORDER — POLYETHYLENE GLYCOL 3350 17 G PO PACK: 17.0000 g | PACK | Freq: Every day | ORAL | 0 refills | Status: DC | PRN

## 2021-06-18 MED ORDER — METFORMIN HCL 500 MG PO TABS
500.0000 mg | ORAL_TABLET | Freq: Two times a day (BID) | ORAL | 3 refills | Status: DC
  Filled 2021-06-18: qty 180, 90d supply, fill #0
  Filled 2021-09-11: qty 180, 90d supply, fill #1
  Filled 2021-12-15: qty 180, 90d supply, fill #2
  Filled 2022-02-11 – 2022-03-22 (×2): qty 180, 90d supply, fill #3

## 2021-06-18 NOTE — Progress Notes (Unsigned)
   Established Patient Office Visit  Subjective   Patient ID: BRODRICK FIELDHOUSE, male    DOB: 12-25-1965  Age: 56 y.o. MRN: [id number]  Chief Complaint  Patient presents with   Hospitalization Follow-up    HPI  {History (Optional):23778}  ROS    Objective:     Pulse 92   Temp 97.7 F (36.5 C)   SpO2 94%  {Vitals History (Optional):23777}  Physical Exam   No results found for any visits on 06/18/21.  {Labs (Optional):23779}  The ASCVD Risk score (Arnett DK, et al., 2019) failed to calculate for the following reasons:   The patient has a prior MI or stroke diagnosis    Assessment & Plan:   Problem List Items Addressed This Visit       Endocrine   Diabetes mellitus ([place])   Relevant Medications   metFORMIN (GLUCOPHAGE) 500 MG tablet     Other   Constipation - Primary   Relevant Medications   polyethylene glycol (MIRALAX / GLYCOLAX) 17 g packet    No follow-ups on file.    Elwin Mocha, MD

## 2021-06-19 ENCOUNTER — Ambulatory Visit (HOSPITAL_COMMUNITY): Payer: Non-veteran care

## 2021-06-19 ENCOUNTER — Other Ambulatory Visit (HOSPITAL_COMMUNITY): Payer: Self-pay

## 2021-06-23 ENCOUNTER — Other Ambulatory Visit (HOSPITAL_COMMUNITY): Payer: Self-pay

## 2021-06-30 ENCOUNTER — Telehealth: Payer: Self-pay

## 2021-06-30 NOTE — Telephone Encounter (Signed)
230 pm.  Phone call made to spouse to schedule a home visit at the request of Christin Gusler, NP.  No answer.  Message left requesting a call back.

## 2021-07-01 ENCOUNTER — Telehealth: Payer: Self-pay | Admitting: Family Medicine

## 2021-07-02 ENCOUNTER — Other Ambulatory Visit: Payer: Self-pay | Admitting: Family Medicine

## 2021-07-02 ENCOUNTER — Other Ambulatory Visit (HOSPITAL_COMMUNITY): Payer: Self-pay

## 2021-07-02 MED ORDER — AMOXICILLIN-POT CLAVULANATE 250-62.5 MG/5ML PO SUSR
500.0000 mg | Freq: Two times a day (BID) | ORAL | 0 refills | Status: DC
  Filled 2021-07-02: qty 300, 14d supply, fill #0

## 2021-07-02 MED ORDER — MINERAL OIL RE ENEM
1.0000 | ENEMA | Freq: Once | RECTAL | 0 refills | Status: AC
  Filled 2021-07-02: qty 133, 1d supply, fill #0

## 2021-07-02 MED ORDER — GUAIFENESIN 100 MG/5ML PO LIQD
10.0000 mL | Freq: Four times a day (QID) | ORAL | 0 refills | Status: DC
  Filled 2021-07-02: qty 560, 14d supply, fill #0

## 2021-07-02 MED ORDER — METOCLOPRAMIDE HCL 10 MG/10ML PO SOLN
10.0000 mg | Freq: Four times a day (QID) | ORAL | 0 refills | Status: DC
  Filled 2021-07-02 – 2021-07-03 (×2): qty 120, 3d supply, fill #0

## 2021-07-03 ENCOUNTER — Other Ambulatory Visit (HOSPITAL_COMMUNITY): Payer: Self-pay

## 2021-07-08 ENCOUNTER — Other Ambulatory Visit (HOSPITAL_COMMUNITY): Payer: Self-pay

## 2021-07-08 ENCOUNTER — Ambulatory Visit: Payer: Commercial Managed Care - HMO | Admitting: Family Medicine

## 2021-07-08 ENCOUNTER — Telehealth: Payer: Self-pay | Admitting: Family Medicine

## 2021-07-08 DIAGNOSIS — R112 Nausea with vomiting, unspecified: Secondary | ICD-10-CM

## 2021-07-08 MED ORDER — METOCLOPRAMIDE HCL 10 MG/10ML PO SOLN
10.0000 mg | Freq: Four times a day (QID) | ORAL | 2 refills | Status: DC
  Filled 2021-07-08: qty 1200, 30d supply, fill #0

## 2021-07-09 ENCOUNTER — Observation Stay (HOSPITAL_COMMUNITY)
Admission: EM | Admit: 2021-07-09 | Discharge: 2021-07-11 | Disposition: A | Discharge status: Home Health | Payer: Commercial Managed Care - HMO | Attending: Family Medicine | Admitting: Family Medicine

## 2021-07-09 ENCOUNTER — Emergency Department (HOSPITAL_COMMUNITY): Payer: Commercial Managed Care - HMO

## 2021-07-09 ENCOUNTER — Other Ambulatory Visit (HOSPITAL_COMMUNITY): Payer: Self-pay

## 2021-07-09 ENCOUNTER — Encounter: Payer: Self-pay | Admitting: Family Medicine

## 2021-07-09 DIAGNOSIS — Z7984 Long term (current) use of oral hypoglycemic drugs: Secondary | ICD-10-CM | POA: Insufficient documentation

## 2021-07-09 DIAGNOSIS — N189 Chronic kidney disease, unspecified: Secondary | ICD-10-CM | POA: Insufficient documentation

## 2021-07-09 DIAGNOSIS — R7401 Elevation of levels of liver transaminase levels: Secondary | ICD-10-CM | POA: Diagnosis present

## 2021-07-09 DIAGNOSIS — G319 Degenerative disease of nervous system, unspecified: Secondary | ICD-10-CM

## 2021-07-09 DIAGNOSIS — R748 Abnormal levels of other serum enzymes: Secondary | ICD-10-CM

## 2021-07-09 DIAGNOSIS — Z930 Tracheostomy status: Secondary | ICD-10-CM

## 2021-07-09 DIAGNOSIS — Z8673 Personal history of transient ischemic attack (TIA), and cerebral infarction without residual deficits: Secondary | ICD-10-CM | POA: Diagnosis not present

## 2021-07-09 DIAGNOSIS — E1122 Type 2 diabetes mellitus with diabetic chronic kidney disease: Secondary | ICD-10-CM | POA: Insufficient documentation

## 2021-07-09 DIAGNOSIS — R403 Persistent vegetative state: Secondary | ICD-10-CM | POA: Diagnosis present

## 2021-07-09 DIAGNOSIS — R1110 Vomiting, unspecified: Secondary | ICD-10-CM | POA: Diagnosis present

## 2021-07-09 DIAGNOSIS — I5030 Unspecified diastolic (congestive) heart failure: Secondary | ICD-10-CM | POA: Diagnosis not present

## 2021-07-09 DIAGNOSIS — R0902 Hypoxemia: Secondary | ICD-10-CM | POA: Diagnosis present

## 2021-07-09 DIAGNOSIS — R059 Cough, unspecified: Secondary | ICD-10-CM | POA: Diagnosis not present

## 2021-07-09 DIAGNOSIS — J690 Pneumonitis due to inhalation of food and vomit: Secondary | ICD-10-CM | POA: Diagnosis not present

## 2021-07-09 DIAGNOSIS — E876 Hypokalemia: Secondary | ICD-10-CM

## 2021-07-09 DIAGNOSIS — Y844 Aspiration of fluid as the cause of abnormal reaction of the patient, or of later complication, without mention of misadventure at the time of the procedure: Secondary | ICD-10-CM | POA: Diagnosis not present

## 2021-07-09 DIAGNOSIS — Z20822 Contact with and (suspected) exposure to covid-19: Secondary | ICD-10-CM | POA: Insufficient documentation

## 2021-07-09 DIAGNOSIS — T17908A Unspecified foreign body in respiratory tract, part unspecified causing other injury, initial encounter: Secondary | ICD-10-CM | POA: Diagnosis not present

## 2021-07-09 DIAGNOSIS — I130 Hypertensive heart and chronic kidney disease with heart failure and stage 1 through stage 4 chronic kidney disease, or unspecified chronic kidney disease: Secondary | ICD-10-CM | POA: Insufficient documentation

## 2021-07-09 DIAGNOSIS — T17400A Unspecified foreign body in trachea causing asphyxiation, initial encounter: Secondary | ICD-10-CM | POA: Diagnosis not present

## 2021-07-09 DIAGNOSIS — I10 Essential (primary) hypertension: Secondary | ICD-10-CM

## 2021-07-09 DIAGNOSIS — R7989 Other specified abnormal findings of blood chemistry: Secondary | ICD-10-CM | POA: Insufficient documentation

## 2021-07-09 HISTORY — DX: Other specified bacterial agents as the cause of diseases classified elsewhere: B96.89

## 2021-07-09 HISTORY — DX: Other bacterial infections of unspecified site: A49.8

## 2021-07-09 HISTORY — DX: Nontraumatic intracerebral hemorrhage, unspecified: I61.9

## 2021-07-09 HISTORY — DX: Degenerative disease of nervous system, unspecified: G31.9

## 2021-07-09 HISTORY — DX: Fever, unspecified: R50.9

## 2021-07-09 HISTORY — DX: Hiccough: R06.6

## 2021-07-09 HISTORY — DX: Persistent vegetative state: R40.3

## 2021-07-09 LAB — CBC WITH DIFFERENTIAL/PLATELET
Abs Immature Granulocytes: 0.03 10*3/uL (ref 0.00–0.07)
Basophils Absolute: 0 10*3/uL (ref 0.0–0.1)
Basophils Relative: 0 %
Eosinophils Absolute: 0.1 10*3/uL (ref 0.0–0.5)
Eosinophils Relative: 1 %
HCT: 36.4 % — ABNORMAL LOW (ref 39.0–52.0)
Hemoglobin: 11.3 g/dL — ABNORMAL LOW (ref 13.0–17.0)
Immature Granulocytes: 0 %
Lymphocytes Relative: 20 %
Lymphs Abs: 1.7 10*3/uL (ref 0.7–4.0)
MCH: 24.6 pg — ABNORMAL LOW (ref 26.0–34.0)
MCHC: 31 g/dL (ref 30.0–36.0)
MCV: 79.3 fL — ABNORMAL LOW (ref 80.0–100.0)
Monocytes Absolute: 0.4 10*3/uL (ref 0.1–1.0)
Monocytes Relative: 5 %
Neutro Abs: 6.2 10*3/uL (ref 1.7–7.7)
Neutrophils Relative %: 74 %
Platelets: 258 10*3/uL (ref 150–400)
RBC: 4.59 MIL/uL (ref 4.22–5.81)
RDW: 16.1 % — ABNORMAL HIGH (ref 11.5–15.5)
WBC: 8.5 10*3/uL (ref 4.0–10.5)
nRBC: 0 % (ref 0.0–0.2)

## 2021-07-09 LAB — COMPREHENSIVE METABOLIC PANEL
ALT: 114 U/L — ABNORMAL HIGH (ref 0–44)
AST: 47 U/L — ABNORMAL HIGH (ref 15–41)
Albumin: 3.1 g/dL — ABNORMAL LOW (ref 3.5–5.0)
Alkaline Phosphatase: 53 U/L (ref 38–126)
Anion gap: 9 (ref 5–15)
BUN: 24 mg/dL — ABNORMAL HIGH (ref 6–20)
CO2: 30 mmol/L (ref 22–32)
Calcium: 9.7 mg/dL (ref 8.9–10.3)
Chloride: 106 mmol/L (ref 98–111)
Creatinine, Ser: 1.19 mg/dL (ref 0.61–1.24)
GFR, Estimated: >60 mL/min (ref >60)
Glucose, Bld: 104 mg/dL — ABNORMAL HIGH (ref 70–99)
Potassium: 3.2 mmol/L — ABNORMAL LOW (ref 3.5–5.1)
Sodium: 145 mmol/L (ref 135–145)
Total Bilirubin: 0.8 mg/dL (ref 0.3–1.2)
Total Protein: 6.6 g/dL (ref 6.5–8.1)

## 2021-07-09 LAB — LACTIC ACID, PLASMA: Lactic Acid, Venous: 1.2 mmol/L (ref 0.5–1.9)

## 2021-07-09 LAB — LIPASE, BLOOD: Lipase: 217 U/L — ABNORMAL HIGH (ref 11–51)

## 2021-07-09 LAB — SARS CORONAVIRUS 2 BY RT PCR: SARS Coronavirus 2 by RT PCR: NEGATIVE

## 2021-07-09 MED ORDER — LOSARTAN POTASSIUM 50 MG PO TABS
50.0000 mg | ORAL_TABLET | Freq: Every day | ORAL | Status: DC
  Administered 2021-07-09 – 2021-07-11 (×3): 50 mg
  Filled 2021-07-09 (×3): qty 1

## 2021-07-09 MED ORDER — SODIUM CHLORIDE 0.9 % IV SOLN
2.0000 g | INTRAVENOUS | Status: AC
  Administered 2021-07-09: 2 g via INTRAVENOUS
  Filled 2021-07-09: qty 12.5

## 2021-07-09 MED ORDER — POLYETHYLENE GLYCOL 3350 17 G PO PACK: 17.0000 g | PACK | Freq: Every day | ORAL | Status: DC | PRN

## 2021-07-09 MED ORDER — FREE WATER
200.0000 mL | Status: DC
  Administered 2021-07-09 – 2021-07-11 (×11): 200 mL

## 2021-07-09 MED ORDER — POTASSIUM CHLORIDE 20 MEQ PO PACK
40.0000 meq | PACK | Freq: Once | ORAL | Status: AC
  Administered 2021-07-10: 40 meq
  Filled 2021-07-09: qty 2

## 2021-07-09 MED ORDER — OSMOLITE 1.5 CAL PO LIQD
1000.0000 mL | ORAL | Status: DC
Start: 2021-07-09 — End: 2021-07-09
  Administered 2021-07-09: 1000 mL
  Filled 2021-07-09: qty 1000

## 2021-07-09 MED ORDER — PROSOURCE TF PO LIQD
45.0000 mL | Freq: Two times a day (BID) | ORAL | Status: DC
Start: 2021-07-09 — End: 2021-07-11
  Administered 2021-07-10 – 2021-07-11 (×3): 45 mL
  Filled 2021-07-09 (×4): qty 45

## 2021-07-09 MED ORDER — ACETAMINOPHEN 325 MG PO TABS
650.0000 mg | ORAL_TABLET | Freq: Four times a day (QID) | ORAL | Status: DC | PRN
  Administered 2021-07-09: 650 mg
  Filled 2021-07-09: qty 2

## 2021-07-09 MED ORDER — ALBUTEROL SULFATE (2.5 MG/3ML) 0.083% IN NEBU: 2.5000 mg | INHALATION_SOLUTION | RESPIRATORY_TRACT | Status: DC | PRN

## 2021-07-09 MED ORDER — ISOSORBIDE DINITRATE 20 MG PO TABS
20.0000 mg | ORAL_TABLET | Freq: Three times a day (TID) | ORAL | Status: DC
Start: 2021-07-09 — End: 2021-07-11
  Administered 2021-07-09 – 2021-07-11 (×6): 20 mg
  Filled 2021-07-09 (×6): qty 1

## 2021-07-09 MED ORDER — AMLODIPINE BESYLATE 10 MG PO TABS
10.0000 mg | ORAL_TABLET | Freq: Every day | ORAL | Status: DC
  Administered 2021-07-09 – 2021-07-11 (×3): 10 mg
  Filled 2021-07-09: qty 2
  Filled 2021-07-09 (×2): qty 1

## 2021-07-09 MED ORDER — CEFEPIME HCL 2 G IV SOLR
2.0000 g | Freq: Three times a day (TID) | INTRAVENOUS | Status: DC
Start: 2021-07-09 — End: 2021-07-09

## 2021-07-09 MED ORDER — SODIUM CHLORIDE 0.9 % IV SOLN: INTRAVENOUS | Status: DC

## 2021-07-09 MED ORDER — METRONIDAZOLE 500 MG/100ML IV SOLN
500.0000 mg | Freq: Two times a day (BID) | INTRAVENOUS | Status: DC
  Administered 2021-07-09: 500 mg via INTRAVENOUS
  Filled 2021-07-09: qty 100

## 2021-07-09 MED ORDER — CLONIDINE HCL 0.2 MG/24HR TD PTWK
0.2000 mg | MEDICATED_PATCH | TRANSDERMAL | Status: DC
  Administered 2021-07-10: 0.2 mg via TRANSDERMAL
  Filled 2021-07-09: qty 1

## 2021-07-09 MED ORDER — OSMOLITE 1.5 CAL PO LIQD
1000.0000 mL | ORAL | Status: DC
Start: 2021-07-09 — End: 2021-07-11
  Administered 2021-07-09 – 2021-07-10 (×2): 1000 mL
  Filled 2021-07-09: qty 1000

## 2021-07-09 MED ORDER — CARVEDILOL 12.5 MG PO TABS
12.5000 mg | ORAL_TABLET | Freq: Two times a day (BID) | ORAL | Status: DC
  Administered 2021-07-09 – 2021-07-11 (×4): 12.5 mg
  Filled 2021-07-09 (×4): qty 1

## 2021-07-09 MED ORDER — METHOCARBAMOL 500 MG PO TABS
500.0000 mg | ORAL_TABLET | Freq: Three times a day (TID) | ORAL | Status: DC
  Administered 2021-07-09 – 2021-07-11 (×6): 500 mg
  Filled 2021-07-09 (×6): qty 1

## 2021-07-09 NOTE — H&P (Signed)
Hospital Admission History and Physical Service Pager: [phone]  Patient name: Stephen Delgado Medical record number: [id number] Date of Birth: 11-05-1965 Age: 56 y.o. Gender: male  Primary Care Provider: Elwin Mocha, MD Consultants: None Code Status: Full Preferred Emergency Contact:  Contact Information     Name Relation Home Work Mobile   Stephen Delgado Son   [phone]   Stephen Delgado Spouse [phone]     Stephen Delgado Brother   [phone]   Stephen Delgado Other   [phone]        Chief Complaint: Hypoxia  Assessment and Plan: Stephen Delgado is a 56 y.o. male presenting with new hypoxia. Differential for this patient's presentation of this includes PE, pneumothorax, viral respiratory infection, HEpEF exacerbation. These are less likely causes of presenting symptoms due to no tachycardia, no pneumothorax seen on CXR, no leukocytosis or fever, and pt does not appear fluid overloaded on exam.   * Aspiration into airway Symptoms most likely due to aspiration as patient vomited a week ago, and again yesterday with emesis seen coming out of his trach.  CXR did not show infiltrates suggesting aspiration PNA. I would expect to see infiltrates in upper lobes with aspiration PNA as pt is in vegetative state.  Will d/c antibiotics.  Patient is admitted as he now has new oxygen requirement, currently requiring 5 L via trach at 28% FiO2. Patient has not had any nutrition or water through his G-tube since last night as they came here this morning, we will start half maintenance IV fluids in addition to G tube feeds to prevent further dehydration. -Admit to med telemetry, attending Dr. McDiarmid -d/c cefepime and metronidazole -Continue supplemental oxygen as needed -Keep O2 saturation greater 92% -Continue home albuterol nebulizer 2.5 mg every 4 hours as needed -1/2 mIVF  Primary hypertension Patient was hypertensive upon admission to 186/111, had not had any of his  antihypertensives today as he has been in the hospital. -Continue home amlodipine 10 mg daily -Continue home carvedilol 12.5 mg twice daily -Continue home clonidine patch 0.2 mg weekly -Continue home losartan 50 mg daily  Elevated lipase Lipase elevated to 217. Can consider pancreatitis although pt has not vomited since yesterday morning. I would expect more vomiting if pancreatitis were the cause.  Will continue to monitor pt and check another lipase in the morning to see if this resolves. -am lipase   Transaminitis On admission patient was found to have AST elevated to 47 ALT elevated to 114.  Lipase was also checked due to vomiting was found to be elevated to 217.  RUQ [place] ordered which showed sludge within gallbladder, no gallbladder wall thickening, no cholecystitis, and likely fatty infiltration of liver. -am CMP  Hypokalemia Potassium 3.2 on admission -40 mEq potassium per tube -A.m. CMP   Chronic medical issues and medications: S/p bilateral embolic strokes December 2022 and needs total assistance required at baseline, vegetative state -Continue home Robaxin -Frequent repositioning  Tracheostomy dependent and G-tube dependent -Trach care, G-tube feeds, RD consulted  HFpEF -Continue home Imdur  FEN/GI: G-tube feeds VTE Prophylaxis: None, patient at baseline mobility  Disposition: med tele  History of Present Illness:  Stephen Delgado is a 56 y.o. male presenting with hypoxia.  History provided by patient's son. Yesterday around 11 am pt vomited, some of which came out of trach.  Patient received anti nausea med yesterday, Reglan. Son states he seemed okay over night. This morning nurse came and son stated his O2 sat was down into  low 80's so they decided to bring him to the ED. No fevers, no diaphoresis. No issues with BMs or urination. He has not vomited since yesterday morning. Prior to vomiting yesterday morning, pt seems like his normal self. Has not has any medications  since 6 am.   Over last couple weeks, son states they made changes to his feeds d/t constipation and vomiting (thought feeds were too large). Miralax has helped with constipation. Prior to yesterday, last vomited a small amount about a week ago, son thinks abdominal binder was too tight.  Per ED note, caregiver had told ED provider that after vomiting last week, he was given Augmentin due to aspiration which he has been on for 7 days.   In the ED, patient was tachypneic with oxygen saturations in the 90s on blow-by oxygen.  CXR showed opacities concerning for pneumonia.  He was started on cefepime and Flagyl per pharmacy recommendations for aspiration pneumonia.  ED provider noted elevated LFTs and lipase and CMP and ordered RUQ ultrasound. Blood cx also ordered in ED.   Review Of Systems: Per HPI   Pertinent Past Medical History: Remainder reviewed in history tab.   Pertinent Past Surgical History:  Remainder reviewed in history tab.   Pertinent Social History: Tobacco use: Yes/No/Former Alcohol use: no Other Substance use: no Lives with family  Pertinent Family History: Remainder reviewed in history tab.   Important Outpatient Medications: Remainder reviewed in medication history.   Objective: BP (!) 173/98   Pulse 86   Temp (S) 98.5 F (36.9 C) (Rectal)   Resp 12   SpO2 97%  Exam: General: 57 year old patient in vegetative state, non verbal, NAD Eyes: White sclera, clear conjunctiva ENTM: Trach in place Cardiovascular: RRR, normal S1/S2, no murmur, cap refill 2 sec Respiratory: Normal work of breathing, good air movement, possibly mild coarse breath sounds heard throughout but difficult to differentiate from transmitted upper airway sounds with breathing. Gastrointestinal: Bowel sounds present, soft, nondistended, nontender MSK: Upper extremities contracted inward across chest Derm: warm and dry Neuro: At basline, able to move eyes, in a vegetative state  Labs:  CBC  BMET  Recent Labs  Lab 07/09/21 1145  WBC 8.5  HGB 11.3*  HCT 36.4*  PLT 258   Recent Labs  Lab 07/09/21 1145  NA 145  K 3.2*  CL 106  CO2 30  BUN 24*  CREATININE 1.19  GLUCOSE 104*  CALCIUM 9.7      Imaging Studies Performed:  Imaging Study (ie. Chest x-ray) Impression from Radiologist: Atelectasis versus infiltrate in the lung bases.   My Interpretation: mild haziness at bases, not convincing for aspiration PNA   Precious Gilding, DO 07/09/2021, 4:15 PM PGY-1, [facility] pager: [phone], text pages welcome Secure chat group [place]

## 2021-07-09 NOTE — Assessment & Plan Note (Addendum)
Lipase 217>114. No further episodes of emesis, symptoms do not fit with pancreatitis.

## 2021-07-09 NOTE — Assessment & Plan Note (Addendum)
Potassium 3.2 > 3.6 after 40 mEq repletion.

## 2021-07-09 NOTE — Assessment & Plan Note (Addendum)
BP stable after receiving home medications yesterday afternoon. Have ranged 101-172/82-106 overnight -Continue home amlodipine 10 mg daily -Continue home carvedilol 12.5 mg twice daily -Continue home clonidine patch 0.2 mg weekly -Continue home losartan 50 mg daily

## 2021-07-09 NOTE — ED Provider Notes (Signed)
[facility] EMERGENCY DEPARTMENT Provider Note   CSN: [id number] Arrival date & time: 07/09/21  0623     History  Chief Complaint  Patient presents with   Aspiration    Stephen Delgado is a 56 y.o. male.  The history is provided by the EMS personnel, a caregiver, a relative and medical records. The history is limited by the condition of the patient. No language interpreter was used.  Cough Cough characteristics:  Productive Sputum characteristics:  Nondescript Severity:  Severe Onset quality:  Gradual Duration:  2 days Timing:  Constant Progression:  Worsening Chronicity:  New Relieved by:  Nothing Worsened by:  Nothing Ineffective treatments:  None tried Associated symptoms: shortness of breath   Associated symptoms: no chest pain, no chills, no fever, no rash and no wheezing        Home Medications Prior to Admission medications   Medication Sig Start Date End Date Taking? Authorizing Provider  acetaminophen (TYLENOL) 325 MG tablet Place 2 tablets (650 mg total) into feeding tube every 6 (six) hours. 05/21/21   Elgergawy, Silver Huguenin, MD  albuterol (PROVENTIL) (2.5 MG/3ML) 0.083% nebulizer solution Take 3 mLs (2.5 mg total) by nebulization every 4 (four) hours as needed for wheezing or shortness of breath. 05/21/21   Elgergawy, Silver Huguenin, MD  amLODipine (NORVASC) 10 MG tablet Place 1 tablet (10 mg total) into feeding tube daily. 06/17/21   Elwin Mocha, MD  amoxicillin-clavulanate (AUGMENTIN) 250-62.5 MG/5ML suspension Place 10 mLs into feeding tube 2 times daily for 14 days. (Discard after 10 days and return to pharmacy for final bottle) 07/02/21 07/17/21  Elwin Mocha, MD  Blood Glucose Monitoring Suppl (BLOOD GLUCOSE MONITOR SYSTEM) w/Device KIT Use up to 4 (four) times daily as directed 06/17/21   Elwin Mocha, MD  Blood Pressure Monitoring [place] 1 application. by Does not apply route as needed. 06/03/21   Elwin Mocha, MD  carvedilol (COREG) 12.5  MG tablet Place 1 tablet (12.5 mg total) into feeding tube 2 (two) times daily with a meal. 06/17/21   Elwin Mocha, MD  cloNIDine (CATAPRES - DOSED IN MG/24 HR) 0.2 mg/24hr patch Place 1 patch (0.2 mg total) onto the skin once a week. 06/17/21   Elwin Mocha, MD  collagenase (SANTYL) 250 UNIT/GM ointment Apply 1 application. topically 2 (two) times daily. To  affected area 05/30/21   Elwin Mocha, MD  glucose blood [facility] VERIO) test strip Use to check blood sugar up to 4 (four) times daily as directed 06/17/21   Elwin Mocha, MD  guaiFENesin (ROBITUSSIN) 100 MG/5ML liquid Take 10 mLs by mouth every 6 (six) hours for 14 days. 07/02/21 07/16/21  Elwin Mocha, MD  isosorbide dinitrate (ISORDIL) 20 MG tablet Place 1 tablet (20 mg total) into feeding tube 3 (three) times daily. 06/17/21   Elwin Mocha, MD  Lancets (ONETOUCH DELICA PLUS [id number]) MISC Use to test blood sugar up to 4 (four) times daily as directed 06/17/21   Elwin Mocha, MD  losartan (COZAAR) 50 MG tablet Place 1 tablet (50 mg total) into feeding tube daily. 06/17/21   Elwin Mocha, MD  metFORMIN (GLUCOPHAGE) 500 MG tablet Take 1 tablet (500 mg total) by mouth 2 (two) times daily with a meal. 06/18/21   Elwin Mocha, MD  methocarbamol (ROBAXIN) 500 MG tablet Place 1 tablet (500 mg total) into feeding tube 3 (three) times daily. 06/17/21   Elwin Mocha, MD  metoCLOPramide (REGLAN) 10 MG/10ML SOLN Take 10 mLs (10 mg total) by mouth every 6 (six) hours. 07/08/21 10/06/21  Elwin Mocha, MD  Mouthwashes (MOUTH RINSE) LIQD solution 15 mLs by Mouth Rinse route 2 times daily at 12 noon and 4 pm. 05/21/21   Elgergawy, Silver Huguenin, MD  Nutritional Supplements (FEEDING SUPPLEMENT, OSMOLITE 1.5 CAL,) LIQD Place 1,000 mLs into feeding tube continuous. 05/22/21   Elgergawy, Silver Huguenin, MD  Nutritional Supplements (FEEDING SUPPLEMENT, PROSOURCE TF,) liquid Place 45 mLs into feeding tube 2 (two) times daily. 05/21/21   Elgergawy,  Silver Huguenin, MD  polyethylene glycol (MIRALAX / GLYCOLAX) 17 g packet Place 17 g into feeding tube daily as needed for mild constipation. 06/18/21   Elwin Mocha, MD  trimethoprim-polymyxin b Mayra Neer) ophthalmic solution Place 2 drops into the left eye every 4 hours. 06/15/21   Elwin Mocha, MD  Water For Irrigation, Sterile (FREE WATER) SOLN Place 200 mLs into feeding tube every 4 (four) hours. 05/21/21   Elgergawy, Silver Huguenin, MD      Allergies    Patient has no known allergies.    Review of Systems   Review of Systems  Unable to perform ROS: Patient nonverbal  Constitutional:  Negative for chills, fatigue and fever.  HENT:  Negative for congestion.   Respiratory:  Positive for cough and shortness of breath. Negative for chest tightness and wheezing.   Cardiovascular:  Negative for chest pain, palpitations and leg swelling.  Gastrointestinal:  Positive for nausea and vomiting. Negative for abdominal distention, abdominal pain, constipation and diarrhea.  Musculoskeletal:  Negative for back pain and neck pain.  Skin:  Negative for rash and wound.  Psychiatric/Behavioral:  Negative for agitation and confusion.     Physical Exam Updated Vital Signs BP (!) 168/106 (BP Location: Left Arm)   Pulse 83   Temp (S) 98.5 F (36.9 C) (Rectal)   Resp 18   SpO2 99%  Physical Exam Vitals and nursing note reviewed.  Constitutional:      General: He is not in acute distress.    Appearance: He is well-developed. He is not toxic-appearing or diaphoretic.  HENT:     Head: Normocephalic and atraumatic.     Nose: Nose normal.     Mouth/Throat:     Mouth: Mucous membranes are moist.  Eyes:     Conjunctiva/sclera: Conjunctivae normal.  Cardiovascular:     Rate and Rhythm: Normal rate and regular rhythm.     Heart sounds: No murmur heard. Pulmonary:     Effort: No respiratory distress.     Breath sounds: No stridor. Rhonchi present. No wheezing.  Chest:     Chest wall: No tenderness.   Abdominal:     General: Abdomen is flat. There is no distension.     Palpations: Abdomen is soft.     Tenderness: There is no abdominal tenderness. There is no right CVA tenderness, left CVA tenderness, guarding or rebound.  Musculoskeletal:        General: No swelling or tenderness.     Cervical back: Neck supple.  Skin:    General: Skin is warm and dry.     Capillary Refill: Capillary refill takes less than 2 seconds.  Neurological:     Mental Status: He is alert. Mental status is at baseline.     ED Results / Procedures / Treatments   Labs (all labs ordered are listed, but only abnormal results are displayed) Labs Reviewed  CBC WITH DIFFERENTIAL/PLATELET -  Abnormal; Notable for the following components:      Result Value   Hemoglobin 11.3 (*)    HCT 36.4 (*)    MCV 79.3 (*)    MCH 24.6 (*)    RDW 16.1 (*)    All other components within normal limits  COMPREHENSIVE METABOLIC PANEL - Abnormal; Notable for the following components:   Potassium 3.2 (*)    Glucose, Bld 104 (*)    BUN 24 (*)    Albumin 3.1 (*)    AST 47 (*)    ALT 114 (*)    All other components within normal limits  LIPASE, BLOOD - Abnormal; Notable for the following components:   Lipase 217 (*)    All other components within normal limits  CULTURE, BLOOD (ROUTINE X 2)  CULTURE, BLOOD (ROUTINE X 2)  SARS CORONAVIRUS 2 BY RT PCR  LACTIC ACID, PLASMA  LACTIC ACID, PLASMA    EKG None  Radiology DG Chest Portable 1 View  Result Date: 07/09/2021 CLINICAL DATA:  Vomiting, concern for aspiration EXAM: PORTABLE CHEST 1 VIEW COMPARISON:  Radiograph May 08, 2021 FINDINGS: Patient is rotated with lordotic positioning. Tracheostomy tube in place. Similar enlargement of the cardiac silhouette and vascular pedicle, at least in part related to technique. Hazy opacities in the bilateral lung bases. No visible pleural effusion or pneumothorax. No acute osseous abnormality. IMPRESSION: Atelectasis versus infiltrate  in the lung bases. Electronically Signed   By: Dahlia Bailiff M.D.   On: 07/09/2021 10:19    Procedures Procedures    CRITICAL CARE Performed by: Gwenyth Allegra Kyira Volkert Total critical care time: 35 minutes Critical care time was exclusive of separately billable procedures and treating other patients. Critical care was necessary to treat or prevent imminent or life-threatening deterioration. Critical care was time spent personally by me on the following activities: development of treatment plan with patient and/or surrogate as well as nursing, discussions with consultants, evaluation of patient's response to treatment, examination of patient, obtaining history from patient or surrogate, ordering and performing treatments and interventions, ordering and review of laboratory studies, ordering and review of radiographic studies, pulse oximetry and re-evaluation of patient's condition.    Medications Ordered in ED Medications  metroNIDAZOLE (FLAGYL) IVPB 500 mg (500 mg Intravenous New Bag/Given 07/09/21 1322)  ceFEPIme (MAXIPIME) 2 g in sodium chloride 0.9 % 100 mL IVPB (has no administration in time range)  ceFEPIme (MAXIPIME) 2 g in sodium chloride 0.9 % 100 mL IVPB (2 g Intravenous New Bag/Given 07/09/21 1248)    ED Course/ Medical Decision Making/ A&P                           Medical Decision Making Amount and/or Complexity of Data Reviewed Labs: ordered. Radiology: ordered.  Risk Prescription drug management. Decision regarding hospitalization.    Stephen Delgado is a 56 y.o. male with a past medical history significant for trach dependence, previous stroke, PEG tube dependence, hypertension, diabetes, and CKD who presents for hypoxia.  According to patient's EMS team and caregiver, patient had an episode of vomiting last week and then had some Reglan and did well.  Due to some coughing he developed since the last aspiration last week he was started on Augmentin and has been on  Augmentin for the last 7 days.  He then had 3 episodes of vomiting yesterday and they are concerned he aspirated again.  The nurse reports that a moderate amount of emesis  was coming out of his trach and even more came out after suctioning.  Patient was seen by his physician who ordered more nausea medicine and he has not vomited since.  He does not have any oxygen at home as he normally is on room air with his trach however this morning he was breathing harder, having more coughing, and was having oxygen saturations in the 80s which he never has.  Despite suctioning and respiratory care his oxygen saturations still remained the 80s and they did not have option to increase it.  Patient was sent to the emergency department for evaluation work-up of suspected aspiration pneumonia.  Otherwise, they report that his PEG tube that was replaced last month has been doing well and is working normally.  He is otherwise not appearing in distress and they report no other fevers, chills, bowel or urinary changes.  He is not appearing to be in pain.  On my exam, lungs were coarse bilaterally.  He was tachypneic slightly and oxygen saturations were in the 80s with EMS.  Patient now is on a blow-by oxygen set up to maintain oxygen saturations in the 90s.  Chest and abdomen do not appear tender.  I did appreciate bowel sounds.  No evidence of problem with his PEG tube.  Patient has chronic contractures and per family is at mental status baseline.  Given the report that emesis was coming out of his trach I am concerned he had some degree of aspiration.  With his new hypoxia and coarse breath sounds, will get x-ray, labs, and respiratory will evaluate.  Respiratory care provided some suctioning and he is now on oxygen.  After imaging is completed, anticipate admission for new hypoxia.  X-ray returned showing evidence of aspiration/opacities concerning for pneumonia.  Given the fact that the patient's been Augmentin for the  last week with worsened cough, had emesis coming out of his trach yesterday, and is not hypoxic, we will order antibiotics and admit.  Spoke to pharmacy who will order the antibiotics for the patient.  They recommended cefepime and Flagyl.  When labs are completed, will call for admission.  1:14 PM Labs have begun to return.  Patient's LFTs and lipase are elevated, chart review does not show evidence of previous cholecystectomy.  I reassessed patient and he is still without any tenderness in his upper abdomen but he is unable to describe any symptoms.  We will get rapid ultrasound to look for acute cholecystitis as cause of his nausea and vomiting over the last week.  We will still call for admission for the aspiration pneumonia with new hypoxia and oxygen requirement as well.  1:21 PM Spoke to [facility] who will see for admission.  They agree with the work-up and will follow-up on the ultrasound to rule out cholecystitis        Final Clinical Impression(s) / ED Diagnoses Final diagnoses:  Aspiration into airway, initial encounter  Aspiration pneumonia of lower lobe, unspecified aspiration pneumonia type, unspecified laterality (HCC)  Hypoxia  Cough, unspecified type  LFT elevation  Vomiting, unspecified vomiting type, unspecified whether nausea present    Clinical Impression: 1. Aspiration into airway, initial encounter   2. Aspiration pneumonia of lower lobe, unspecified aspiration pneumonia type, unspecified laterality ([place])   3. Hypoxia   4. Cough, unspecified type   5. LFT elevation   6. Vomiting, unspecified vomiting type, unspecified whether nausea present     Disposition: Admit  This note was prepared with assistance of Dragon  voice recognition software. Occasional wrong-word or sound-a-like substitutions may have occurred due to the inherent limitations of voice recognition software.        Cathryne Mancebo, Gwenyth Allegra, MD 07/09/21 [phone]

## 2021-07-09 NOTE — ED Notes (Signed)
Patient suctioned by this RN at this time. Patient and family at bedside updated on the current plan of care and verbalize understanding.

## 2021-07-09 NOTE — Progress Notes (Signed)
Pharmacy Antibiotic Note  JABORI HENEGAR is a 56 y.o. male admitted on 07/09/2021 with  asp pna - s/p Augmentin x 7 days PTA .  Pharmacy has been consulted for Cefepime dosing. Also receiving Flagyl.  Plan: Cefepime 2gm IV q8h F/u renal function, micro data, and pt's clinical condition    Temp (24hrs), Avg:98.5 F (36.9 C), Min:98.5 F (36.9 C), Max:98.5 F (36.9 C)  No results for input(s): "WBC", "CREATININE", "LATICACIDVEN", "VANCOTROUGH", "VANCOPEAK", "VANCORANDOM", "GENTTROUGH", "GENTPEAK", "GENTRANDOM", "TOBRATROUGH", "TOBRAPEAK", "TOBRARND", "AMIKACINPEAK", "AMIKACINTROU", "AMIKACIN" in the last 168 hours.  CrCl cannot be calculated (Patient's most recent lab result is older than the maximum 21 days allowed.).    No Known Allergies  Antimicrobials this admission: 6/8 Cefepime >>  6/8 Flagyl >>  Microbiology results: Pending  Thank you for allowing pharmacy to be a part of this patient's care.  Christoper Fabian, PharmD, BCPS Please see amion for complete clinical pharmacist phone list 07/09/2021 11:06 AM

## 2021-07-09 NOTE — ED Notes (Signed)
Will be a delay in antibiotics due to access issues

## 2021-07-09 NOTE — Progress Notes (Signed)
Paged FMTS about missing osmolite feeding rate in the MAR. Two sons at the bedside and confirm to this RN that patient is getting 60 ml/hr. Relayed to FMTS and agreed to start the feeding at the same rate 60 ml/hr. Information relayed to the pharmacy as well.

## 2021-07-09 NOTE — Assessment & Plan Note (Addendum)
AST now WNL, ALT 114>97. RUQ [place] yesterday showed gall bladder sludge and fatty liver. No need to continue trending.

## 2021-07-09 NOTE — ED Triage Notes (Incomplete)
To ED from home via [facility] with c/o possible aspiration-- vomited yesterday.  HX -- trach/stroke/ has 10 hour home care.  Not on feed since 11am yesterday. PEG tube intact.

## 2021-07-09 NOTE — Assessment & Plan Note (Addendum)
CXR did not show infiltrates suggesting aspiration PNA. Was requiring 5 L via trach at 28% FiO2 but weaned to room air this morning and breathing comfortably.  G tube feeds started yesterday evening and RD consulted to evaluate if any changes need to be made to home feeds to help prevent further episodes of emesis. - supplemental oxygen as needed -Keep O2 saturation greater 92% -Continue home albuterol nebulizer 2.5 mg every 4 hours as needed -RD consult

## 2021-07-09 NOTE — ED Notes (Signed)
Pt is a difficult stick-- has had 5 attempts for IVs between EMS and this RN. IV team consult placed.

## 2021-07-10 ENCOUNTER — Other Ambulatory Visit (HOSPITAL_COMMUNITY): Payer: Self-pay

## 2021-07-10 DIAGNOSIS — R7401 Elevation of levels of liver transaminase levels: Secondary | ICD-10-CM

## 2021-07-10 DIAGNOSIS — Z930 Tracheostomy status: Secondary | ICD-10-CM | POA: Diagnosis not present

## 2021-07-10 DIAGNOSIS — T17908A Unspecified foreign body in respiratory tract, part unspecified causing other injury, initial encounter: Secondary | ICD-10-CM | POA: Diagnosis not present

## 2021-07-10 DIAGNOSIS — R403 Persistent vegetative state: Secondary | ICD-10-CM | POA: Diagnosis not present

## 2021-07-10 LAB — CBC
HCT: 33.9 % — ABNORMAL LOW (ref 39.0–52.0)
Hemoglobin: 10.4 g/dL — ABNORMAL LOW (ref 13.0–17.0)
MCH: 23.9 pg — ABNORMAL LOW (ref 26.0–34.0)
MCHC: 30.7 g/dL (ref 30.0–36.0)
MCV: 77.9 fL — ABNORMAL LOW (ref 80.0–100.0)
Platelets: 239 10*3/uL (ref 150–400)
RBC: 4.35 MIL/uL (ref 4.22–5.81)
RDW: 16.1 % — ABNORMAL HIGH (ref 11.5–15.5)
WBC: 7.6 10*3/uL (ref 4.0–10.5)
nRBC: 0 % (ref 0.0–0.2)

## 2021-07-10 LAB — COMPREHENSIVE METABOLIC PANEL
ALT: 97 U/L — ABNORMAL HIGH (ref 0–44)
AST: 35 U/L (ref 15–41)
Albumin: 2.7 g/dL — ABNORMAL LOW (ref 3.5–5.0)
Alkaline Phosphatase: 48 U/L (ref 38–126)
Anion gap: 4 — ABNORMAL LOW (ref 5–15)
BUN: 22 mg/dL — ABNORMAL HIGH (ref 6–20)
CO2: 28 mmol/L (ref 22–32)
Calcium: 8.9 mg/dL (ref 8.9–10.3)
Chloride: 110 mmol/L (ref 98–111)
Creatinine, Ser: 1.1 mg/dL (ref 0.61–1.24)
GFR, Estimated: >60 mL/min (ref >60)
Glucose, Bld: 143 mg/dL — ABNORMAL HIGH (ref 70–99)
Potassium: 3.6 mmol/L (ref 3.5–5.1)
Sodium: 142 mmol/L (ref 135–145)
Total Bilirubin: 0.5 mg/dL (ref 0.3–1.2)
Total Protein: 5.9 g/dL — ABNORMAL LOW (ref 6.5–8.1)

## 2021-07-10 LAB — LIPASE, BLOOD: Lipase: 114 U/L — ABNORMAL HIGH (ref 11–51)

## 2021-07-10 NOTE — Progress Notes (Signed)
[facility]PTS Brief Progress Note  S:Went bedside to see patient. Patient laying in bed, did not disturb.    O: BP (!) 166/106   Pulse 96   Temp 99 F (37.2 C) (Axillary)   Resp 14   SpO2 99%     A/P: - Plans per day team - Orders reviewed. Labs for AM ordered, which was adjusted as needed.   Bess Kinds, MD 07/10/2021, 3:07 AM PGY-1, [facility] Night Resident  Please page [phone] with questions.

## 2021-07-10 NOTE — Discharge Instructions (Addendum)
Thank you for allowing [place] to care for Stephen Delgado during his stay. He was admitted after requiring oxygen after likely aspirating when he vomited. He was treated with oxygen supplementation and IV fluids for hydration. He is now breathing comfortably on room air. He should follow up with his primary care provider in 2-3 days after discharge.   We recommend he stop taking the Augmentin (an antibiotic) as he does not need it and it can cause stomach upset. You can also talk with his primary care provider about stopping his metformin as his sugars are stable, his hemoglobin A1c was 6.1 and metformin can also cause stomach upset.  Reasons to return/seek medical evaluation: If his oxygen saturation drops below 90% consistently, if he starts vomiting more and you suspect aspiration.

## 2021-07-10 NOTE — TOC Transition Note (Addendum)
Transition of Care [facility]) - CM/SW Discharge Note   Patient Details  Name: Stephen Delgado MRN: [id number] Date of Birth: November 04, 1965  Transition of Care [facility]) CM/SW Contact:  Epifanio Lesches, RN Phone Number: 07/10/2021, 4:31 PM   Clinical Narrative:    Presents with hypoxia. Hx of  trach dependence, previous stroke, PEG tube dependence, hypertension, diabetes, and CKD. Pt from home with wife, primary caregiver. Pt receives private duty nursing ( ? 84hrs /week) from [facility]. Per MD patient ready for DC today.RN, patient, patient's family, and [facility]  notified of DC. Discharge Summary and nutrition note need to be faxed to Rob/  [facility] @  [phone].  PTAR ambulance transport requested for patient, time  pickup for 6pm.. Transport papers on front of chart.  RNCM will sign off for now as intervention is no longer needed. Please consult [place] again if new needs arise.   07/10/2021 @ 4:58pm  Nutrition noe faxed to Rob/  [facility] @  [phone].  Final next level of care: Home w Home Health Services Barriers to Discharge: No Barriers Identified   Patient Goals and CMS Choice     Choice offered to / list presented to : Spouse  Discharge Placement                       Discharge Plan and Services   Discharge Planning Services: CM Consult Post Acute Care Choice: Home Health (Private Duty Nurse)                               Social Determinants of Health (SDOH) Interventions     Readmission Risk Interventions    02/20/2021    9:31 AM  Readmission Risk Prevention Plan  Transportation Screening Complete  Medication Review (RN Care Manager) Complete  PCP or Specialist appointment within 3-5 days of discharge Not Complete  PCP/Specialist Appt Not Complete comments Patient will need LTC placement at SNF facility.  [place] or Home Care Consult Complete  SW Recovery Care/Counseling Consult Complete  Palliative Care  Screening Complete  Skilled Nursing Facility Complete

## 2021-07-10 NOTE — Discharge Summary (Cosign Needed Addendum)
[facility] Discharge Summary  Patient name: Stephen Delgado Medical record number: [id number] Date of birth: 09/05/1965 Age: 56 y.o. Gender: male Date of Admission: 07/09/2021  Date of Discharge: 07/10/21 Admitting Physician: Blane Ohara McDiarmid, MD  Primary Care Provider: Elwin Mocha, MD Consultants: none  Indication for Hospitalization: Hypoxia  Discharge Diagnoses/Problem List:  Principal Problem:   Aspiration into airway Active Problems:   Primary hypertension   Hypokalemia   Tracheostomy dependent (HCC)   Transaminitis   Vomiting in adult   Elevated lipase   Persistent vegetative state ([place])   Disposition: Home  Discharge Condition: Stable  Discharge Exam:  General: 56 year old male, in a vegetative state, NAD Cardiovascular: RRR, normal S1/S2 Respiratory: CTAB, breathing comfortably on room air Abdomen: Bowel sounds present, soft, nontender to palpation, nondistended  Brief Hospital Course:  Stephen Delgado is a 56 y.o. male with PMHx significant for stroke in 2022, trach dependence, PEG tube dependence, hypertension, diabetes, and CKD admitted for hypoxia.   In the ED, patient was tachypneic with oxygen saturations in the 90s on blow-by oxygen but had previously been in the low 80's at home.  Patient was afebrile without leukocytosis.  In the ED, He was started on cefepime and Flagyl d/t concern for aspiration pneumonia.  ED provider ordered RUQ ultrasound d/t elevated LFTs and lipase.  Aspiration CXR did not show infiltrates suggesting aspiration PNA and antibiotics were discontinued.  Patient remained on 5 L O2 via trach at 28% FiO2 overnight but the next morning was weaned to room air.  At discharge patient was breathing comfortably on room air with oxygen saturations in the high 90s.  He was continued on home feeds. RD was consulted and recommended continue with home feeding regimen but to avoid bolus feeds and that a J tube extension  could be considered in the event emesis continues.  Previously prescribed Augmentin was not continued during admission was d/c'd at discharge.  Hypokalemia K of 3.2 on admission, repleted appropriately.   Elevated lipase Lipase was elevated to 217 and trended downward to 114.  Patient no episodes of emesis during his stay and abdomen remained soft and non tender.   Transaminitis On admission patient was found to have AST elevated to 47 ALT elevated to 114. RUQ [place] ordered which showed sludge within gallbladder, no gallbladder wall thickening, no cholecystitis, and likely fatty infiltration of liver.  On day of discharge AST was WNL and ALT trended down to 97.  Hypertension Patient was hypertensive upon admission to 186/111, had not had any of his antihypertensives.  His home antihypertensives including amlodipine, carvedilol, clonidine, and losartan were continued  Issues for follow up: Follow up respiratory status Follow up on any reoccurring emesis, consider J-tube extension if needed.  Consider discontinuing metformin as A1c has been under goal and it could contribute to GI upset    Significant Procedures: None  Significant Labs and Imaging:  Recent Labs  Lab 07/09/21 1145 07/10/21 0600  WBC 8.5 7.6  HGB 11.3* 10.4*  HCT 36.4* 33.9*  PLT 258 239   Recent Labs  Lab 07/09/21 1145 07/10/21 0600  NA 145 142  K 3.2* 3.6  CL 106 110  CO2 30 28  GLUCOSE 104* 143*  BUN 24* 22*  CREATININE 1.19 1.10  CALCIUM 9.7 8.9  ALKPHOS 53 48  AST 47* 35  ALT 114* 97*  ALBUMIN 3.1* 2.7*     Results/Tests Pending at Time of Discharge: none  Discharge Medications:  Allergies as of 07/10/2021   No Known Allergies      Medication List     STOP taking these medications    amoxicillin-clavulanate 250-62.5 MG/5ML suspension Commonly known as: Augmentin   guaiFENesin 100 MG/5ML liquid Commonly known as: ROBITUSSIN   metoCLOPramide 10 MG/10ML Soln Commonly known as: REGLAN        TAKE these medications    acetaminophen 325 MG tablet Commonly known as: TYLENOL Place 2 tablets (650 mg total) into feeding tube every 6 (six) hours. What changed:  when to take this reasons to take this   albuterol (2.5 MG/3ML) 0.083% nebulizer solution Commonly known as: PROVENTIL Take 3 mLs (2.5 mg total) by nebulization every 4 (four) hours as needed for wheezing or shortness of breath.   amLODipine 10 MG tablet Commonly known as: NORVASC Place 1 tablet (10 mg total) into feeding tube daily.   Blood Glucose Monitor System w/Device Kit Use up to 4 (four) times daily as directed   Blood Pressure Monitoring Devi 1 application. by Does not apply route as needed.   carvedilol 12.5 MG tablet Commonly known as: COREG Place 1 tablet (12.5 mg total) into feeding tube 2 (two) times daily with a meal.   cloNIDine 0.2 mg/24hr patch Commonly known as: CATAPRES - Dosed in mg/24 hr Place 1 patch (0.2 mg total) onto the skin once a week.   feeding supplement (PROSource TF) liquid Place 45 mLs into feeding tube 2 (two) times daily.   feeding supplement (OSMOLITE 1.5 CAL) Liqd Place 1,000 mLs into feeding tube continuous.   free water Soln Place 200 mLs into feeding tube every 4 (four) hours.   isosorbide dinitrate 20 MG tablet Commonly known as: ISORDIL Place 1 tablet (20 mg total) into feeding tube 3 (three) times daily.   losartan 50 MG tablet Commonly known as: COZAAR Place 1 tablet (50 mg total) into feeding tube daily.   metFORMIN 500 MG tablet Commonly known as: GLUCOPHAGE Take 1 tablet (500 mg total) by mouth 2 (two) times daily with a meal.   methocarbamol 500 MG tablet Commonly known as: ROBAXIN Place 1 tablet (500 mg total) into feeding tube 3 (three) times daily.   mouth rinse Liqd solution 15 mLs by Mouth Rinse route 2 times daily at 12 noon and 4 pm.   OneTouch Delica Plus [id number] Misc Use to test blood sugar up to 4 (four) times daily as  directed   OneTouch Verio test strip Generic drug: glucose blood Use to check blood sugar up to 4 (four) times daily as directed   polyethylene glycol 17 g packet Commonly known as: MIRALAX / GLYCOLAX Place 17 g into feeding tube daily as needed for mild constipation.   Santyl 250 UNIT/GM ointment Generic drug: collagenase Apply 1 application. topically 2 (two) times daily. To  affected area What changed:  when to take this reasons to take this additional instructions        Discharge Instructions: Please refer to Patient Instructions section of EMR for full details.  Patient was counseled important signs and symptoms that should prompt return to medical care, changes in medications, dietary instructions, activity restrictions, and follow up appointments.   Follow-Up Appointments:  Follow-up Information     Elwin Mocha, MD Follow up.   Specialty: Family Medicine Contact information: [place] [address] [phone]                 Precious Gilding, DO 07/10/2021, 6:47 PM  PGY-1, [place]

## 2021-07-10 NOTE — Hospital Course (Addendum)
Stephen Delgado is a 56 y.o. male with PMHx significant for stroke in 2022, trach dependence, PEG tube dependence, hypertension, diabetes, and CKD admitted for hypoxia.   In the ED, patient was tachypneic with oxygen saturations in the 90s on blow-by oxygen but had previously been in the low 80's at home.  Patient was afebrile without leukocytosis.  In the ED, He was started on cefepime and Flagyl d/t concern for aspiration pneumonia.  ED provider ordered RUQ ultrasound d/t elevated LFTs and lipase.  Aspiration CXR did not show infiltrates suggesting aspiration PNA and antibiotics were discontinued.  Patient remained on 5 L O2 via trach at 28% FiO2 overnight but the next morning was weaned to room air.  At discharge patient was breathing comfortably on room air with oxygen saturations in the high 90s.  He was continued on home feeds. RD was consulted and recommended continue with home feeding regimen but to avoid bolus feeds and that a J tube extension could be considered in the event emesis continues.  Previously prescribed Augmentin was not continued during admission was d/c'd at discharge.  Hypokalemia K of 3.2 on admission, repleted appropriately.   Elevated lipase Lipase was elevated to 217 and trended downward to 114.  Patient no episodes of emesis during his stay and abdomen remained soft and non tender.   Transaminitis On admission patient was found to have AST elevated to 47 ALT elevated to 114. RUQ [place] ordered which showed sludge within gallbladder, no gallbladder wall thickening, no cholecystitis, and likely fatty infiltration of liver.  On day of discharge AST was WNL and ALT trended down to 97.  Hypertension Patient was hypertensive upon admission to 186/111, had not had any of his antihypertensives.  His home antihypertensives including amlodipine, carvedilol, clonidine, and losartan were continued  Issues for follow up: Follow up respiratory status Follow up on any reoccurring  emesis, consider J-tube extension if needed.  Consider discontinuing metformin as A1c has been under goal and it could contribute to GI upset

## 2021-07-10 NOTE — Progress Notes (Addendum)
Initial Nutrition Assessment  DOCUMENTATION CODES:   Not applicable  INTERVENTION:  - Tube Feeding via G-tube: Continue Osmolite 1.5 at rate of 60 ml/hr Pro-Source TF 45 mL daily This provides 108 g of protein, 2200 kcals, 1094 mL free water   - Free water flushes 200 ml Q4h   NUTRITION DIAGNOSIS:   Inadequate oral intake related to chronic illness as evidenced by NPO status.  GOAL:   Patient will meet greater than or equal to 90% of their needs  MONITOR:   Labs, Weight trends, TF tolerance  REASON FOR ASSESSMENT:   Consult Assessment of nutrition requirement/status, Enteral/tube feeding initiation and management  ASSESSMENT:   Pt admitted with hypoxia, likely d/t aspiration into airway. PMH significant for  vegetative states, HTN, T2DM, IVH, bilateral embolic stroke, CKD, dysphagia, trach, chronic PEG.  No family present at bedside. Unsuccessful attempt to reach family via phone call to obtain detailed history regarding tube feedings. Per review of chart, wife reports 3 episodes of emesis within the last 2 weeks and is concerned as uncertain what could be the cause. Noted family suspects binder may have been too tight.   Pt has been followed by RD during prior admissions. It appears pt had been tolerating TF well although noted to have had recurrent emesis and ileus type symptoms which GI was following and had suspected GIB however was unable to determine clear etiology. Spoke with RN, states pt has been tolerating TF well today with no presence of emesis. If emesis becomes a chronic issue, could consider addition of J-tube extension. Would not recommend adjusting TF at this time as this formula is currently meeting pt's nutritional needs.   Addendum: Spoke w/pt's wife who is also an Human resources officer of [employer]. She states that they have a HH nurse through [place] who administers TF. Pt had been experiencing episodes of constipation and diarrhea during prior admission. It is  unclear when but [facility] nurse had requested a different regimen to assess for better tolerance as it sounds as though he may have been experiencing ongoing loose stools. At home it sounds as though he may have been receiving 1 carton Osmolite 1.5 bolus feeding 4 times daily in addition to ProSource TF BID. Discussed with her about order to switch back to continuous tube feeding as this poses less chance of aspiration, in addition to ensuring that the head of bed remains >30 degrees. We also discussed if emesis continues, the option for J-tube extension. She appreciates recommendation. She called [facility] nurse who reports that she already received orders for new home regimen and had no further questions. Spoke with Dr. Wynelle Link regarding recommendations for continuous tube feeding and J-tube extension in the future.   Medications reviewed  Labs: BUN 22, anion gap 4, lipase 114, ALT 97  NUTRITION - FOCUSED PHYSICAL EXAM:  Flowsheet Row Most Recent Value  Orbital Region Mild depletion  Upper Arm Region No depletion  Thoracic and Lumbar Region Unable to assess  [wearing binder]  Buccal Region No depletion  Temple Region Mild depletion  Clavicle Bone Region No depletion  Clavicle and Acromion Bone Region Mild depletion  Scapular Bone Region No depletion  Dorsal Hand No depletion  Patellar Region Mild depletion  Anterior Thigh Region Mild depletion  Posterior Calf Region No depletion  Edema (RD Assessment) None  Hair Reviewed  Eyes Reviewed  Mouth Unable to assess  Skin Reviewed  Nails Reviewed       Diet Order:   Diet Order  None       EDUCATION NEEDS:   Education needs have been addressed  Skin:  Skin Assessment: Reviewed RN Assessment  Last BM:  6/9 (type 6)  Height:   Ht Readings from Last 1 Encounters:  01/23/21 6' (1.829 m)   Weight:   Wt Readings from Last 1 Encounters:  06/10/21 109.3 kg    Ideal Body Weight:  80.9 kg  BMI:  There is no height or weight on file to  calculate BMI.  Estimated Nutritional Needs:   Kcal:  2100-2300  Protein:  105-120g  Fluid:  >/=2.1L  Drusilla Kanner, RDN, LDN Clinical Nutrition

## 2021-07-10 NOTE — Progress Notes (Signed)
Discharge summary packet provided to spouse/son. Pt d/c to home with [facility] as ordered. Pt remains non-verbal, on trach collar, and in no apparent distress.Per PTAR pt is no .5 on the list.

## 2021-07-10 NOTE — Progress Notes (Addendum)
     Daily Progress Note Intern Pager: [phone]  Patient name: Stephen Delgado Medical record number: [id number] Date of birth: 03-19-1965 Age: 56 y.o. Gender: male  Primary Care Provider: Elwin Mocha, MD Consultants: RD Code Status: Full  Pt Overview and Major Events to Date:  07/09/2021-admitted  Assessment and Plan: HEMANT LAFON is a 56 y.o. male presenting with new hypoxia. Differential for this patient's presentation of this includes PE, pneumothorax, viral respiratory infection, HEpEF exacerbation. These are less likely causes of presenting symptoms due to no tachycardia, no pneumothorax seen on CXR, no leukocytosis or fever, and pt does not appear fluid overloaded on exam.   * Aspiration into airway CXR did not show infiltrates suggesting aspiration PNA. Was requiring 5 L via trach at 28% FiO2 but weaned to room air this morning and breathing comfortably.  G tube feeds started yesterday evening and RD consulted to evaluate if any changes need to be made to home feeds to help prevent further episodes of emesis. - supplemental oxygen as needed -Keep O2 saturation greater 92% -Continue home albuterol nebulizer 2.5 mg every 4 hours as needed -RD consult  Primary hypertension BP stable after receiving home medications yesterday afternoon. Have ranged 101-172/82-106 overnight -Continue home amlodipine 10 mg daily -Continue home carvedilol 12.5 mg twice daily -Continue home clonidine patch 0.2 mg weekly -Continue home losartan 50 mg daily  Elevated lipase Lipase 217>114. No further episodes of emesis, symptoms do not fit with pancreatitis.   Transaminitis AST now WNL, ALT 114>97. RUQ [place] yesterday showed gall bladder sludge and fatty liver. No need to continue trending.    Hypokalemia Potassium 3.2 > 3.6 after 40 mEq repletion.    FEN/GI: G-tube feeds PPx: None, at baseline mobility Dispo: Home, likely today  Subjective:  Spoke with patient's wife this morning who  states patient appears comfortable.  Updated her that he is on room air now and breathing comfortably.  She is still concerned about what is causing him to have these episodes of vomiting, 3 have occurred in the past 2 weeks.  Objective: Temp:  [98.5 F (36.9 C)-99.3 F (37.4 C)] 99.3 F (37.4 C) (06/09 0839) Pulse Rate:  [53-100] 96 (06/09 0839) Resp:  [0-25] 25 (06/09 0818) BP: (101-188)/(73-114) 130/82 (06/09 0839) SpO2:  [88 %-100 %] 100 % (06/09 0839) FiO2 (%):  [28 %] 28 % (06/09 0818) Physical Exam: General: 56 year old male, in a vegetative state, NAD Cardiovascular: RRR, normal S1/S2 Respiratory: CTAB, breathing comfortably on room air Abdomen: Bowel sounds present, soft, nontender to palpation, nondistended   Laboratory: Most recent CBC Lab Results  Component Value Date   WBC 7.6 07/10/2021   HGB 10.4 (L) 07/10/2021   HCT 33.9 (L) 07/10/2021   MCV 77.9 (L) 07/10/2021   PLT 239 07/10/2021   Most recent BMP    Latest Ref Rng & Units 07/10/2021    6:00 AM  BMP  Glucose 70 - 99 mg/dL 143   BUN 6 - 20 mg/dL 22   Creatinine 0.61 - 1.24 mg/dL 1.10   Sodium 135 - 145 mmol/L 142   Potassium 3.5 - 5.1 mmol/L 3.6   Chloride 98 - 111 mmol/L 110   CO2 22 - 32 mmol/L 28   Calcium 8.9 - 10.3 mg/dL 8.9     Precious Gilding, DO 07/10/2021, 9:00 AM  PGY-1, [place] Intern pager: [phone], text pages welcome chat group [place]

## 2021-07-11 NOTE — TOC Transition Note (Addendum)
Transition of Care [facility]) - CM/SW Discharge Note   Patient Details  Name: Stephen Delgado MRN: [id number] Date of Birth: 06/03/1965  [facility]) CM/SW Contact:  Bartholomew Crews, RN Phone Number: [phone] 07/11/2021, 7:57 AM   Clinical Narrative:     Patient to transition home today. PTAR scheduled for 9am. Spoke with Dr. Adah Salvage to ensure continue readiness. Spoke with nursing, [place] with spouse. Medical transport updated and sent to printer at nurses station. No further TOC needs identified at this time.   Update: DC summary faxed to Rob at [facility] [phone]  Final next level of care: [place] Barriers to Discharge: No Barriers Identified   Patient Goals and CMS Choice     Choice offered to / list presented to : Spouse  Discharge Placement                       Discharge Plan and Services   Discharge Planning Services: CM Consult Post Acute Care Choice: Home Health (Private Duty Nurse)                               Social Determinants of Health (SDOH) Interventions     Readmission Risk Interventions    02/20/2021    9:31 AM  Readmission Risk Prevention Plan  Transportation Screening Complete  Medication Review (RN Care Manager) Complete  PCP or Specialist appointment within 3-5 days of discharge Not Complete  PCP/Specialist Appt Not Complete comments Patient will need LTC placement at SNF facility.  [place] or Home Care Consult Complete  SW Recovery Care/Counseling Consult Complete  Palliative Care Screening Complete  Skilled Nursing Facility Complete

## 2021-07-14 LAB — CULTURE, BLOOD (ROUTINE X 2)
Culture: NO GROWTH
Culture: NO GROWTH

## 2021-07-17 ENCOUNTER — Other Ambulatory Visit: Payer: Self-pay | Admitting: Family Medicine

## 2021-07-20 ENCOUNTER — Other Ambulatory Visit (HOSPITAL_COMMUNITY): Payer: Self-pay

## 2021-07-20 MED ORDER — METHOCARBAMOL 500 MG PO TABS
500.0000 mg | ORAL_TABLET | Freq: Three times a day (TID) | ORAL | 0 refills | Status: DC
  Filled 2021-07-20: qty 90, 30d supply, fill #0

## 2021-07-22 ENCOUNTER — Other Ambulatory Visit (HOSPITAL_COMMUNITY): Payer: Self-pay

## 2021-07-24 ENCOUNTER — Ambulatory Visit: Payer: Commercial Managed Care - HMO | Admitting: Family Medicine

## 2021-07-24 ENCOUNTER — Ambulatory Visit (INDEPENDENT_AMBULATORY_CARE_PROVIDER_SITE_OTHER): Payer: Commercial Managed Care - HMO | Admitting: Family Medicine

## 2021-07-24 VITALS — BP 129/77 | Temp 98.9°F | Resp 12

## 2021-07-24 DIAGNOSIS — Z09 Encounter for follow-up examination after completed treatment for conditions other than malignant neoplasm: Secondary | ICD-10-CM

## 2021-07-24 DIAGNOSIS — R059 Cough, unspecified: Secondary | ICD-10-CM

## 2021-07-27 ENCOUNTER — Other Ambulatory Visit (HOSPITAL_COMMUNITY): Payer: Self-pay

## 2021-07-27 MED ORDER — AZITHROMYCIN 200 MG/5ML PO SUSR
ORAL | 0 refills | Status: AC
Start: 2021-07-27 — End: 2021-08-01
  Filled 2021-07-27: qty 60, 5d supply, fill #0

## 2021-07-27 MED ORDER — GUAIFENESIN 100 MG/5ML PO LIQD
10.0000 mL | Freq: Four times a day (QID) | ORAL | 0 refills | Status: AC
  Filled 2021-07-27: qty 400, 10d supply, fill #0

## 2021-07-28 ENCOUNTER — Encounter: Payer: Self-pay | Admitting: Family Medicine

## 2021-07-31 ENCOUNTER — Other Ambulatory Visit (HOSPITAL_COMMUNITY): Payer: Self-pay

## 2021-07-31 MED ORDER — DOXYCYCLINE HYCLATE 100 MG PO TABS
100.0000 mg | ORAL_TABLET | Freq: Two times a day (BID) | ORAL | 0 refills | Status: AC
  Filled 2021-07-31: qty 14, 7d supply, fill #0

## 2021-07-31 NOTE — Telephone Encounter (Signed)
Called by nurse about ongoing increased sputum production. Not much response to azithro. Will send script for doxy. CXR also ordered.  Will see pt next week or the next for tx response eval.  Haydee Salter, MD

## 2021-08-05 ENCOUNTER — Other Ambulatory Visit: Payer: Self-pay | Admitting: Family Medicine

## 2021-08-05 ENCOUNTER — Other Ambulatory Visit (HOSPITAL_COMMUNITY): Payer: Self-pay

## 2021-08-05 ENCOUNTER — Telehealth: Payer: Self-pay | Admitting: Family Medicine

## 2021-08-06 ENCOUNTER — Other Ambulatory Visit (HOSPITAL_COMMUNITY): Payer: Self-pay

## 2021-08-06 MED ORDER — METHOCARBAMOL 500 MG PO TABS
500.0000 mg | ORAL_TABLET | Freq: Three times a day (TID) | ORAL | 0 refills | Status: DC
  Filled 2021-08-06 – 2021-08-11 (×2): qty 90, 30d supply, fill #0

## 2021-08-07 ENCOUNTER — Other Ambulatory Visit (HOSPITAL_COMMUNITY): Payer: Self-pay

## 2021-08-11 ENCOUNTER — Other Ambulatory Visit (HOSPITAL_COMMUNITY): Payer: Self-pay

## 2021-08-11 ENCOUNTER — Other Ambulatory Visit: Payer: Self-pay | Admitting: Family Medicine

## 2021-08-13 ENCOUNTER — Other Ambulatory Visit (HOSPITAL_COMMUNITY): Payer: Self-pay

## 2021-08-14 ENCOUNTER — Other Ambulatory Visit (HOSPITAL_COMMUNITY): Payer: Self-pay

## 2021-08-14 ENCOUNTER — Other Ambulatory Visit: Payer: Self-pay | Admitting: Family Medicine

## 2021-08-14 MED ORDER — METOCLOPRAMIDE HCL 5 MG/5ML PO SOLN
10.0000 mg | Freq: Four times a day (QID) | ORAL | 2 refills | Status: DC
  Filled 2021-08-14: qty 1200, 30d supply, fill #0
  Filled 2021-09-11: qty 1200, 30d supply, fill #1
  Filled 2021-10-13: qty 1200, 30d supply, fill #2

## 2021-08-14 NOTE — Telephone Encounter (Signed)
Req for refill sent by nurse.  Haydee Salter, MD

## 2021-08-17 ENCOUNTER — Other Ambulatory Visit (HOSPITAL_COMMUNITY): Payer: Self-pay

## 2021-08-27 ENCOUNTER — Other Ambulatory Visit (HOSPITAL_COMMUNITY): Payer: Self-pay

## 2021-08-28 ENCOUNTER — Inpatient Hospital Stay (HOSPITAL_COMMUNITY)
Admission: RE | Admit: 2021-08-28 | Discharge: 2021-08-28 | Disposition: A | Discharge status: Home / Self Care | Payer: Non-veteran care | Source: Ambulatory Visit

## 2021-08-29 ENCOUNTER — Ambulatory Visit: Payer: Commercial Managed Care - HMO | Admitting: Family Medicine

## 2021-08-29 VITALS — BP 133/77 | HR 86 | Temp 97.7°F | Resp 10

## 2021-08-31 ENCOUNTER — Encounter: Payer: Self-pay | Admitting: Family Medicine

## 2021-08-31 NOTE — Progress Notes (Addendum)
   Established Patient Office Visit  Subjective   Patient ID: Stephen Delgado, male    DOB: [birth year]  Age: 56 y.o. MRN: [id number]  Chief Complaint  Patient presents with   Tracheostomy Tube Change    56 year old male with past medical history significant for severe stroke and chronic vent dependence to be seen today for trach exchange.  Per bedside nurse patient has been doing well.  No recent episodes of vomiting.  O2 sats have been maintained but drop to 82-86%.  Suctioning has been routine.  No increased secretions or change secretions.  Patient is due for an exchange today as his last exchange was a month ago and this was planned by pulmonary with .  Due to transportation issues family has requested that trach exchange to be done at home.  Patient because of his state is difficult to transfer to outpatient visits and requires ambulance and other measures.      Review of Systems  All other systems reviewed and are negative.     Objective:     BP 133/77   Pulse 86   Temp 97.7 F (36.5 C)   Resp 10   SpO2 95%    Physical Exam Vitals and nursing note reviewed.  Constitutional:      General: He is not in acute distress.    Appearance: He is not ill-appearing.  HENT:     Head: Normocephalic and atraumatic.     Nose: Nose normal.     Mouth/Throat:     Mouth: Mucous membranes are moist.  Neck:     Comments: trach Cardiovascular:     Rate and Rhythm: Normal rate and regular rhythm.     Pulses: Normal pulses.  Pulmonary:     Effort: Pulmonary effort is normal. No respiratory distress.     Breath sounds: Normal breath sounds.     Comments: Vented  Abdominal:     General: Abdomen is flat. There is no distension.  Skin:    Capillary Refill: Capillary refill takes less than 2 seconds.      No results found for any visits on 08/29/21.    The ASCVD Risk score (Arnett DK, et al., 2019) failed to calculate for the following reasons:   The patient has a  prior MI or stroke diagnosis    Assessment & Plan:   Problem List Items Addressed This Visit       Other   Tracheostomy dependent (HCC) - Primary   - See procedure note  Chronic Resp Failure - at rest O2 saturation drops to 82-86% - waiting on O2 delivery  Return in about 1 month (around 09/29/2021) for Nutrition, Blood Pressure, Bowel Care.    Haydee Salter, MD

## 2021-08-31 NOTE — Progress Notes (Signed)
Tracheostomy Exchange Procedure Note  Stephen Delgado  [id number]  November 28, 1965  Date:08/29/21  Time:3:20 PM   Provider Performing:Austin Herd Judie Petit Melynda Ripple   Procedure: Tracheostomy Exchange Through Immature Stoma (02111)  Indication(s) Permanent Trach Patient  Consent Risks of the procedure as well as the alternatives and risks of each were explained to the patient and/or caregiver.  Consent for the procedure was obtained and is signed in the bedside chart  Anesthesia None   Time Out Verified patient identification, verified procedure, site/side was marked, verified correct patient position, special equipment/implants available, medications/allergies/relevant history reviewed, required imaging and test results available.   Sterile Technique Hand hygiene, gloves   Procedure Description Size 6 (7.5cm) uncuffed existing Shiley removed and size 6 (7.5cm) uncuffed Shiley placed through stoma.   Complications/Tolerance None; patient tolerated the procedure well..   EBL Minimal    Haydee Salter, MD

## 2021-09-01 ENCOUNTER — Telehealth: Payer: Self-pay | Admitting: Nurse Practitioner

## 2021-09-01 NOTE — Telephone Encounter (Signed)
I called to schedule f/u visit for pc, no answer, message left with contact information

## 2021-09-09 ENCOUNTER — Other Ambulatory Visit (HOSPITAL_COMMUNITY): Payer: Self-pay

## 2021-09-11 ENCOUNTER — Other Ambulatory Visit (HOSPITAL_COMMUNITY): Payer: Self-pay

## 2021-09-11 ENCOUNTER — Other Ambulatory Visit: Payer: Self-pay | Admitting: Family Medicine

## 2021-09-15 ENCOUNTER — Other Ambulatory Visit: Payer: Self-pay | Admitting: Family Medicine

## 2021-09-15 ENCOUNTER — Other Ambulatory Visit (HOSPITAL_COMMUNITY): Payer: Self-pay

## 2021-09-22 ENCOUNTER — Other Ambulatory Visit (HOSPITAL_COMMUNITY): Payer: Self-pay

## 2021-09-22 ENCOUNTER — Other Ambulatory Visit: Payer: Self-pay | Admitting: Family Medicine

## 2021-09-22 ENCOUNTER — Telehealth: Payer: Self-pay | Admitting: Family Medicine

## 2021-09-23 ENCOUNTER — Telehealth: Payer: Self-pay | Admitting: Family Medicine

## 2021-09-23 ENCOUNTER — Other Ambulatory Visit (HOSPITAL_COMMUNITY): Payer: Self-pay

## 2021-09-23 ENCOUNTER — Telehealth: Payer: Self-pay

## 2021-09-23 MED ORDER — LOSARTAN POTASSIUM 50 MG PO TABS
50.0000 mg | ORAL_TABLET | Freq: Every day | ORAL | 3 refills | Status: DC
  Filled 2021-09-23: qty 90, 90d supply, fill #0
  Filled 2021-12-15: qty 90, 90d supply, fill #1
  Filled 2022-02-11 – 2022-03-22 (×2): qty 90, 90d supply, fill #2
  Filled 2022-06-10: qty 90, 90d supply, fill #3

## 2021-09-23 MED ORDER — CLONIDINE 0.2 MG/24HR TD PTWK
0.2000 mg | MEDICATED_PATCH | TRANSDERMAL | 0 refills | Status: DC
  Filled 2021-09-23: qty 12, 84d supply, fill #0

## 2021-09-23 MED ORDER — CARVEDILOL 12.5 MG PO TABS
12.5000 mg | ORAL_TABLET | Freq: Two times a day (BID) | ORAL | 3 refills | Status: DC
  Filled 2021-09-23: qty 180, 90d supply, fill #0
  Filled 2021-12-15: qty 180, 90d supply, fill #1
  Filled 2022-03-22: qty 180, 90d supply, fill #2
  Filled 2022-06-10: qty 180, 90d supply, fill #3

## 2021-09-23 MED ORDER — AMLODIPINE BESYLATE 10 MG PO TABS
10.0000 mg | ORAL_TABLET | Freq: Every day | ORAL | 3 refills | Status: DC
  Filled 2021-09-23: qty 90, 90d supply, fill #0
  Filled 2021-12-15: qty 90, 90d supply, fill #1
  Filled 2022-02-11 – 2022-03-22 (×2): qty 90, 90d supply, fill #2
  Filled 2022-06-10: qty 90, 90d supply, fill #3

## 2021-09-23 MED ORDER — ISOSORBIDE DINITRATE 20 MG PO TABS
20.0000 mg | ORAL_TABLET | Freq: Three times a day (TID) | ORAL | 3 refills | Status: DC
Start: 2021-09-23 — End: 2022-08-20
  Filled 2021-09-23: qty 69, 23d supply, fill #0
  Filled 2021-09-23: qty 201, 67d supply, fill #0
  Filled 2021-09-23: qty 69, 23d supply, fill #0
  Filled 2021-09-23 – 2021-09-25 (×2): qty 201, 67d supply, fill #0
  Filled 2021-09-25: qty 69, 23d supply, fill #0
  Filled 2022-01-11: qty 270, 90d supply, fill #1
  Filled 2022-02-11 – 2022-03-26 (×3): qty 270, 90d supply, fill #2
  Filled 2022-06-10 – 2022-06-30 (×2): qty 270, 90d supply, fill #3

## 2021-09-23 MED ORDER — METHOCARBAMOL 500 MG PO TABS
500.0000 mg | ORAL_TABLET | Freq: Three times a day (TID) | ORAL | 3 refills | Status: DC
  Filled 2021-09-23: qty 270, 90d supply, fill #0
  Filled 2021-12-15: qty 270, 90d supply, fill #1
  Filled 2022-02-11 – 2022-03-22 (×2): qty 270, 90d supply, fill #2
  Filled 2022-06-10: qty 270, 90d supply, fill #3

## 2021-09-23 NOTE — Telephone Encounter (Signed)
Call complete

## 2021-09-24 ENCOUNTER — Other Ambulatory Visit (HOSPITAL_COMMUNITY): Payer: Self-pay

## 2021-09-25 ENCOUNTER — Other Ambulatory Visit (HOSPITAL_COMMUNITY): Payer: Self-pay

## 2021-10-06 ENCOUNTER — Other Ambulatory Visit: Payer: Self-pay | Admitting: Family Medicine

## 2021-10-13 ENCOUNTER — Other Ambulatory Visit (HOSPITAL_COMMUNITY): Payer: Self-pay

## 2021-10-14 ENCOUNTER — Ambulatory Visit: Payer: Commercial Managed Care - HMO | Admitting: Family Medicine

## 2021-10-14 ENCOUNTER — Other Ambulatory Visit (HOSPITAL_COMMUNITY): Payer: Self-pay

## 2021-10-15 ENCOUNTER — Other Ambulatory Visit (HOSPITAL_COMMUNITY): Payer: Self-pay

## 2021-10-30 ENCOUNTER — Ambulatory Visit: Payer: Commercial Managed Care - HMO | Admitting: Family Medicine

## 2021-11-20 ENCOUNTER — Other Ambulatory Visit: Payer: Self-pay | Admitting: Family Medicine

## 2021-11-20 ENCOUNTER — Other Ambulatory Visit (HOSPITAL_COMMUNITY): Payer: Self-pay

## 2021-11-20 MED ORDER — METOCLOPRAMIDE HCL 5 MG/5ML PO SOLN
10.0000 mg | Freq: Four times a day (QID) | ORAL | 2 refills | Status: DC
  Filled 2021-11-20: qty 1200, 30d supply, fill #0
  Filled 2021-12-15: qty 1130, 29d supply, fill #1
  Filled 2021-12-16: qty 70, 1d supply, fill #1
  Filled 2022-02-11: qty 1200, 30d supply, fill #2

## 2021-11-22 ENCOUNTER — Other Ambulatory Visit: Payer: Self-pay

## 2021-11-22 ENCOUNTER — Emergency Department (HOSPITAL_COMMUNITY)
Admission: EM | Admit: 2021-11-22 | Discharge: 2021-11-22 | Disposition: A | Discharge status: Home / Self Care | Payer: Commercial Managed Care - HMO | Attending: Emergency Medicine | Admitting: Emergency Medicine

## 2021-11-22 ENCOUNTER — Emergency Department (HOSPITAL_COMMUNITY): Payer: Commercial Managed Care - HMO

## 2021-11-22 DIAGNOSIS — I129 Hypertensive chronic kidney disease with stage 1 through stage 4 chronic kidney disease, or unspecified chronic kidney disease: Secondary | ICD-10-CM | POA: Diagnosis not present

## 2021-11-22 DIAGNOSIS — Z79899 Other long term (current) drug therapy: Secondary | ICD-10-CM | POA: Insufficient documentation

## 2021-11-22 DIAGNOSIS — Z7984 Long term (current) use of oral hypoglycemic drugs: Secondary | ICD-10-CM | POA: Insufficient documentation

## 2021-11-22 DIAGNOSIS — E1122 Type 2 diabetes mellitus with diabetic chronic kidney disease: Secondary | ICD-10-CM | POA: Insufficient documentation

## 2021-11-22 DIAGNOSIS — K9423 Gastrostomy malfunction: Secondary | ICD-10-CM | POA: Insufficient documentation

## 2021-11-22 DIAGNOSIS — N189 Chronic kidney disease, unspecified: Secondary | ICD-10-CM | POA: Diagnosis not present

## 2021-11-22 MED ORDER — DIATRIZOATE MEGLUMINE & SODIUM 66-10 % PO SOLN
ORAL | Status: AC
  Filled 2021-11-22: qty 30

## 2021-11-22 NOTE — ED Notes (Signed)
[facility] to take pt home at this time

## 2021-11-22 NOTE — ED Triage Notes (Signed)
Pt BIB [facility] from home accompanied by his home health nurses. [facility] Nurses called b/c patient's G-tube is clogged, unable to remove clog prior to EMS arrival. VSS, patient at cognitive baseline.

## 2021-11-22 NOTE — Discharge Instructions (Signed)
The G-tube was replaced in the emergency room. Continue appropriate utilization of the tube.  Return to the ER if there is severe pain, nausea, vomiting.

## 2021-11-22 NOTE — ED Notes (Signed)
Patient has not had BM since Wednesday, typically goes ~every other day, started vomiting yesterday. HHRN reports patient goes through this cycle ever 3 weeks, g-tube clogging is only new issue.

## 2021-11-22 NOTE — ED Provider Notes (Signed)
[facility] EMERGENCY DEPARTMENT Provider Note   CSN: [id number] Arrival date & time: 11/22/21  1400     History  Chief Complaint  Patient presents with   G-tube Clogged    Stephen Delgado is a 56 y.o. male.  HPI    Level 5 caveat for trach and PEG dependent patient.  56 year old male comes in with chief complaint of clogged G-tube. Patient accompanied by home nurse and also patient's son, the latter has the medical POA.  Patient has history of stroke and is trach and PEG dependent, hypertension, diabetes and CKD.  According to the nursing staff, they were unable to administer her morning meds as the G-tube was not flushing.  His last G-tube was placed in May of this year.  No other complaints or concerns from the patient's nurses or son.  Home Medications Prior to Admission medications   Medication Sig Start Date End Date Taking? Authorizing Provider  acetaminophen (TYLENOL) 325 MG tablet Place 2 tablets (650 mg total) into feeding tube every 6 (six) hours. Patient taking differently: Place 650 mg into feeding tube every 6 (six) hours as needed for mild pain. 05/21/21   Elgergawy, Silver Huguenin, MD  albuterol (PROVENTIL) (2.5 MG/3ML) 0.083% nebulizer solution Take 3 mLs (2.5 mg total) by nebulization every 4 (four) hours as needed for wheezing or shortness of breath. 05/21/21   Elgergawy, Silver Huguenin, MD  amLODipine (NORVASC) 10 MG tablet Place 1 tablet (10 mg total) into feeding tube daily. 09/23/21   Elwin Mocha, MD  Blood Glucose Monitoring Suppl (BLOOD GLUCOSE MONITOR SYSTEM) w/Device KIT Use up to 4 (four) times daily as directed 06/17/21   Elwin Mocha, MD  Blood Pressure Monitoring DEVI 1 application. by Does not apply route as needed. 06/03/21   Elwin Mocha, MD  carvedilol (COREG) 12.5 MG tablet Place 1 tablet (12.5 mg total) into feeding tube 2 (two) times daily 09/23/21   Elwin Mocha, MD  cloNIDine (CATAPRES - DOSED IN MG/24 HR) 0.2 mg/24hr  patch Place 1 patch (0.2 mg total) onto the skin once a week. 09/23/21   Elwin Mocha, MD  collagenase (SANTYL) 250 UNIT/GM ointment Apply 1 application. topically 2 (two) times daily. To  affected area Patient taking differently: Apply 1 application. topically 2 (two) times daily as needed (to affected area for wound care). 05/30/21   Elwin Mocha, MD  glucose blood [facility] VERIO) test strip Use to check blood sugar up to 4 (four) times daily as directed 06/17/21   Elwin Mocha, MD  isosorbide dinitrate (ISORDIL) 20 MG tablet Place 1 tablet (20 mg total) into feeding tube 3 (three) times daily. 09/23/21   Elwin Mocha, MD  Lancets (ONETOUCH DELICA PLUS [id number]) MISC Use to test blood sugar up to 4 (four) times daily as directed 06/17/21   Elwin Mocha, MD  losartan (COZAAR) 50 MG tablet Place 1 tablet (50 mg total) into feeding tube daily. 09/23/21   Elwin Mocha, MD  metFORMIN (GLUCOPHAGE) 500 MG tablet Take 1 tablet (500 mg total) by mouth 2 (two) times daily with a meal. 06/18/21   Elwin Mocha, MD  methocarbamol (ROBAXIN) 500 MG tablet Place 1 tablet (500 mg total) into feeding tube 3 (three) times daily. 09/23/21   Elwin Mocha, MD  metoCLOPramide (REGLAN) 5 MG/5ML solution Take 10 mLs (10 mg total) by mouth every 6 (six) hours. 11/20/21 02/18/22  Elwin Mocha, MD  Mouthwashes (MOUTH  RINSE) LIQD solution 15 mLs by Mouth Rinse route 2 times daily at 12 noon and 4 pm. 05/21/21   Elgergawy, Silver Huguenin, MD  Nutritional Supplements (FEEDING SUPPLEMENT, OSMOLITE 1.5 CAL,) LIQD Place 1,000 mLs into feeding tube continuous. 05/22/21   Elgergawy, Silver Huguenin, MD  Nutritional Supplements (FEEDING SUPPLEMENT, PROSOURCE TF,) liquid Place 45 mLs into feeding tube 2 (two) times daily. 05/21/21   Elgergawy, Silver Huguenin, MD  polyethylene glycol (MIRALAX / GLYCOLAX) 17 g packet Place 17 g into feeding tube daily as needed for mild constipation. 06/18/21   Elwin Mocha, MD  Water For  Irrigation, Sterile (FREE WATER) SOLN Place 200 mLs into feeding tube every 4 (four) hours. 05/21/21   Elgergawy, Silver Huguenin, MD      Allergies    Patient has no known allergies.    Review of Systems   Review of Systems  Physical Exam Updated Vital Signs BP (!) 144/98   Pulse 78   Temp 97.6 F (36.4 C) (Axillary)   Resp 12   SpO2 99%  Physical Exam Vitals and nursing note reviewed.  Constitutional:      Appearance: He is well-developed.  HENT:     Head: Atraumatic.  Cardiovascular:     Rate and Rhythm: Normal rate.  Pulmonary:     Effort: Pulmonary effort is normal.  Abdominal:     Tenderness: There is no abdominal tenderness.  Musculoskeletal:     Cervical back: Neck supple.  Skin:    General: Skin is warm.  Neurological:     Mental Status: He is oriented to person, place, and time.     ED Results / Procedures / Treatments   Labs (all labs ordered are listed, but only abnormal results are displayed) Labs Reviewed - No data to display  EKG None  Radiology DG Abdomen 1 View  Result Date: 11/22/2021 CLINICAL DATA:  Please confirm G tube placement EXAM: ABDOMEN - 1 VIEW COMPARISON:  X-ray abdomen 06/08/2021 FINDINGS: Gastrostomy tube overlies the left upper abdomen with PO contrast noted within the tube. PO contrast partially opacifies the gastric lumen with the gastrostomy tip and balloon kidney be seen overlying. Gaseous distension of the large and small bowel. No radio-opaque calculi or other significant radiographic abnormality are seen. IMPRESSION: Gastrostomy tube in appropriate position. Electronically Signed   By: Iven Finn M.D.   On: 11/22/2021 19:11    Procedures Gastrostomy tube replacement  Date/Time: 11/22/2021 6:31 PM  Performed by: Varney Biles, MD Authorized by: Varney Biles, MD  Consent: Written consent obtained. Risks and benefits: risks, benefits and alternatives were discussed Consent given by: guardian and spouse Patient  understanding: patient states understanding of the procedure being performed Imaging studies: imaging studies available Patient identity confirmed: arm band Time out: Immediately prior to procedure a "time out" was called to verify the correct patient, procedure, equipment, support staff and site/side marked as required. Preparation: Patient was prepped and draped in the usual sterile fashion. Local anesthesia used: no  Anesthesia: Local anesthesia used: no  Sedation: Patient sedated: no  Patient tolerance: patient tolerated the procedure well with no immediate complications       Medications Ordered in ED Medications  diatrizoate meglumine-sodium (GASTROGRAFIN) 66-10 % solution (has no administration in time range)    ED Course/ Medical Decision Making/ A&P Clinical Course as of 11/22/21 2013  Sun Nov 22, 2021  1832 G Tube replaced without complication.  Family made aware. [AN]    Clinical Course User Index [AN]  Varney Biles, MD                           Medical Decision Making Amount and/or Complexity of Data Reviewed Radiology: ordered.   56 year old male comes in with chief complaint of clogged G-tube. Pt has hx of stroke and is trach/peg dependent.  No other concerns.  Abd exam is reassuring.  Plan is for nursing team to attempt to declog the G-tube.  If they are unsuccessful, then we will have to replace the 16 [place] G-tube.   5:30 PM: Nursing staff were unable to unclog the G-tube. Family is consented for G-tube replacement.  8:13 PM X-ray interpreted independently.  G-tube is in place.  Patient stable for discharge.  Final Clinical Impression(s) / ED Diagnoses Final diagnoses:  Gastrostomy tube dysfunction [facility])    Rx / DC Orders ED Discharge Orders     None         Varney Biles, MD 11/22/21 2013

## 2021-11-22 NOTE — ED Notes (Signed)
Consent for G-tube replacement signed by wife at bedside. Tube replaced (16 fr.) without complication by Dr. Kathrynn Humble. Pending xray for placement verification.

## 2021-11-22 NOTE — ED Notes (Signed)
RN attempted to flush G-tube with warm water. Unable to flush G-tube due to resistance. MD Nanavati notified.

## 2021-11-22 NOTE — ED Notes (Signed)
Respiratory called for humidified air for trach

## 2021-12-08 ENCOUNTER — Other Ambulatory Visit (HOSPITAL_COMMUNITY): Payer: Self-pay

## 2021-12-08 ENCOUNTER — Telehealth: Payer: Self-pay | Admitting: Family Medicine

## 2021-12-08 DIAGNOSIS — R0989 Other specified symptoms and signs involving the circulatory and respiratory systems: Secondary | ICD-10-CM

## 2021-12-08 MED ORDER — SULFAMETHOXAZOLE-TRIMETHOPRIM 200-40 MG/5ML PO SUSP
20.0000 mL | Freq: Two times a day (BID) | ORAL | 0 refills | Status: AC
  Filled 2021-12-08: qty 280, 7d supply, fill #0

## 2021-12-08 NOTE — Telephone Encounter (Incomplete)
CC: congestion  HPI: Pt with some congestion. Nurse reports crackles on exam. No fever or chills.   A/P Chest consgestion - cont breathing tx per protocol - record residuals and hold per protocol - start bactrim bid, f/u CXR  Elwin Mocha, MD

## 2021-12-09 ENCOUNTER — Other Ambulatory Visit (HOSPITAL_COMMUNITY): Payer: Self-pay

## 2021-12-15 ENCOUNTER — Other Ambulatory Visit (HOSPITAL_COMMUNITY): Payer: Self-pay

## 2021-12-16 ENCOUNTER — Other Ambulatory Visit (HOSPITAL_COMMUNITY): Payer: Self-pay

## 2021-12-25 ENCOUNTER — Other Ambulatory Visit (HOSPITAL_COMMUNITY): Payer: Self-pay

## 2021-12-25 ENCOUNTER — Other Ambulatory Visit: Payer: Self-pay | Admitting: Family Medicine

## 2021-12-30 ENCOUNTER — Other Ambulatory Visit (HOSPITAL_COMMUNITY): Payer: Self-pay

## 2021-12-30 ENCOUNTER — Ambulatory Visit: Payer: Commercial Managed Care - HMO | Admitting: Family Medicine

## 2021-12-30 ENCOUNTER — Encounter: Payer: Self-pay | Admitting: Family Medicine

## 2021-12-30 VITALS — BP 122/74 | HR 84 | Temp 98.0°F

## 2021-12-30 DIAGNOSIS — Z789 Other specified health status: Secondary | ICD-10-CM

## 2021-12-30 DIAGNOSIS — Z930 Tracheostomy status: Secondary | ICD-10-CM

## 2021-12-30 DIAGNOSIS — L98421 Non-pressure chronic ulcer of back limited to breakdown of skin: Secondary | ICD-10-CM

## 2021-12-30 DIAGNOSIS — E119 Type 2 diabetes mellitus without complications: Secondary | ICD-10-CM

## 2021-12-30 MED ORDER — CHLORHEXIDINE GLUCONATE 0.12 % MT SOLN
15.0000 mL | Freq: Two times a day (BID) | OROMUCOSAL | 0 refills | Status: DC
  Filled 2021-12-30: qty 473, 16d supply, fill #0

## 2021-12-30 NOTE — Progress Notes (Unsigned)
Dr. Melynda Ripple did a home visit for evaluation of bed sore on coccyx. A wedge was ordered through [employer].

## 2022-01-01 ENCOUNTER — Ambulatory Visit: Payer: Commercial Managed Care - HMO | Admitting: Family Medicine

## 2022-01-11 ENCOUNTER — Other Ambulatory Visit (HOSPITAL_COMMUNITY): Payer: Self-pay

## 2022-01-11 ENCOUNTER — Other Ambulatory Visit: Payer: Self-pay

## 2022-01-15 ENCOUNTER — Other Ambulatory Visit: Payer: Self-pay | Admitting: Family Medicine

## 2022-01-15 ENCOUNTER — Other Ambulatory Visit (HOSPITAL_COMMUNITY): Payer: Self-pay

## 2022-01-15 ENCOUNTER — Other Ambulatory Visit (HOSPITAL_BASED_OUTPATIENT_CLINIC_OR_DEPARTMENT_OTHER): Payer: Self-pay

## 2022-01-15 DIAGNOSIS — R0989 Other specified symptoms and signs involving the circulatory and respiratory systems: Secondary | ICD-10-CM

## 2022-01-20 ENCOUNTER — Other Ambulatory Visit (HOSPITAL_COMMUNITY): Payer: Self-pay

## 2022-01-20 MED ORDER — CLONIDINE 0.2 MG/24HR TD PTWK
0.2000 mg | MEDICATED_PATCH | TRANSDERMAL | 0 refills | Status: DC
  Filled 2022-01-20: qty 12, 84d supply, fill #0

## 2022-01-21 ENCOUNTER — Ambulatory Visit: Payer: Commercial Managed Care - HMO | Admitting: Family Medicine

## 2022-01-28 ENCOUNTER — Other Ambulatory Visit (HOSPITAL_COMMUNITY): Payer: Self-pay

## 2022-01-28 ENCOUNTER — Other Ambulatory Visit: Payer: Self-pay | Admitting: Family Medicine

## 2022-02-03 ENCOUNTER — Emergency Department (HOSPITAL_COMMUNITY)
Admission: EM | Admit: 2022-02-03 | Discharge: 2022-02-03 | Disposition: A | Discharge status: Home / Self Care | Payer: Commercial Managed Care - HMO | Attending: Emergency Medicine | Admitting: Emergency Medicine

## 2022-02-03 ENCOUNTER — Other Ambulatory Visit (HOSPITAL_COMMUNITY): Payer: Self-pay

## 2022-02-03 ENCOUNTER — Emergency Department (HOSPITAL_COMMUNITY): Payer: Commercial Managed Care - HMO

## 2022-02-03 DIAGNOSIS — Z20822 Contact with and (suspected) exposure to covid-19: Secondary | ICD-10-CM | POA: Insufficient documentation

## 2022-02-03 DIAGNOSIS — J9500 Unspecified tracheostomy complication: Secondary | ICD-10-CM | POA: Diagnosis present

## 2022-02-03 LAB — CBC WITH DIFFERENTIAL/PLATELET
Abs Immature Granulocytes: 0.04 10*3/uL (ref 0.00–0.07)
Basophils Absolute: 0 10*3/uL (ref 0.0–0.1)
Basophils Relative: 0 %
Eosinophils Absolute: 0.2 10*3/uL (ref 0.0–0.5)
Eosinophils Relative: 2 %
HCT: 41.9 % (ref 39.0–52.0)
Hemoglobin: 12.8 g/dL — ABNORMAL LOW (ref 13.0–17.0)
Immature Granulocytes: 1 %
Lymphocytes Relative: 21 %
Lymphs Abs: 1.6 10*3/uL (ref 0.7–4.0)
MCH: 24.2 pg — ABNORMAL LOW (ref 26.0–34.0)
MCHC: 30.5 g/dL (ref 30.0–36.0)
MCV: 79.4 fL — ABNORMAL LOW (ref 80.0–100.0)
Monocytes Absolute: 0.5 10*3/uL (ref 0.1–1.0)
Monocytes Relative: 6 %
Neutro Abs: 5.3 10*3/uL (ref 1.7–7.7)
Neutrophils Relative %: 70 %
Platelets: 260 10*3/uL (ref 150–400)
RBC: 5.28 MIL/uL (ref 4.22–5.81)
RDW: 15.6 % — ABNORMAL HIGH (ref 11.5–15.5)
WBC: 7.6 10*3/uL (ref 4.0–10.5)
nRBC: 0 % (ref 0.0–0.2)

## 2022-02-03 LAB — COMPREHENSIVE METABOLIC PANEL
ALT: 13 U/L (ref 0–44)
AST: 23 U/L (ref 15–41)
Albumin: 3.1 g/dL — ABNORMAL LOW (ref 3.5–5.0)
Alkaline Phosphatase: 50 U/L (ref 38–126)
Anion gap: 13 (ref 5–15)
BUN: 31 mg/dL — ABNORMAL HIGH (ref 6–20)
CO2: 26 mmol/L (ref 22–32)
Calcium: 9.5 mg/dL (ref 8.9–10.3)
Chloride: 105 mmol/L (ref 98–111)
Creatinine, Ser: 0.89 mg/dL (ref 0.61–1.24)
GFR, Estimated: >60 mL/min (ref >60)
Glucose, Bld: 134 mg/dL — ABNORMAL HIGH (ref 70–99)
Potassium: 3.7 mmol/L (ref 3.5–5.1)
Sodium: 144 mmol/L (ref 135–145)
Total Bilirubin: 0.4 mg/dL (ref 0.3–1.2)
Total Protein: 6.8 g/dL (ref 6.5–8.1)

## 2022-02-03 LAB — RESP PANEL BY RT-PCR (RSV, FLU A&B, COVID)  RVPGX2
Influenza A by PCR: NEGATIVE
Influenza B by PCR: NEGATIVE
Resp Syncytial Virus by PCR: NEGATIVE
SARS Coronavirus 2 by RT PCR: NEGATIVE

## 2022-02-03 LAB — LIPASE, BLOOD: Lipase: 59 U/L — ABNORMAL HIGH (ref 11–51)

## 2022-02-03 MED ORDER — CHLORHEXIDINE GLUCONATE 0.12 % MT SOLN
15.0000 mL | Freq: Two times a day (BID) | OROMUCOSAL | 0 refills | Status: DC
  Filled 2022-02-03: qty 473, 16d supply, fill #0

## 2022-02-03 MED ORDER — ONDANSETRON HCL 4 MG/2ML IJ SOLN
4.0000 mg | Freq: Once | INTRAMUSCULAR | Status: AC
  Administered 2022-02-03: 4 mg via INTRAVENOUS
  Filled 2022-02-03: qty 2

## 2022-02-03 NOTE — Procedures (Signed)
Tracheostomy Change Note  Patient Details:   Name: Stephen Delgado DOB: 09/24/1965 MRN: [id number]    Airway Documentation:     Evaluation  O2 sats: stable throughout Complications: No apparent complications Patient did tolerate procedure well.    #4 cuffless shiley placed. Positive color change with EZ-Cap. Sp02=99%   Ulice Dash 02/03/2022, 1:39 AM

## 2022-02-03 NOTE — ED Notes (Signed)
ED provider and resp at bedside at this time

## 2022-02-03 NOTE — ED Triage Notes (Signed)
BIB [facility] from c/o trach tube removal. Pt was coughing and contracted and family thinks he contracted and pulled trach tube out. Rhonchi bilat upper lobes, coughing up blood, mucus, and vomiting. EMS placed aq size 5.0 ET tube in trach area to maintain access, swelling and bleeding noted around trach sight.

## 2022-02-03 NOTE — ED Provider Notes (Signed)
[facility] EMERGENCY DEPARTMENT Provider Note   CSN: [id number] Arrival date & time: 02/03/22  0123     History  Chief Complaint  Patient presents with   Tracheostomy Tube Change    Stephen Delgado is a 57 y.o. male.  To the ER by EMS from home.  Patient was noted to have an episode of coughing and somehow dislodged his trach.  Patient is very contracted upper extremities at baseline, hands are held near his neck line at all times.  It sounds as though when he coughed his hands came up and accidentally dislodged the trach.  EMS reports that there was some blood and a lot of mucus at the scene, he may have vomited once during transport.       Home Medications Prior to Admission medications   Medication Sig Start Date End Date Taking? Authorizing Provider  acetaminophen (TYLENOL) 325 MG tablet Place 2 tablets (650 mg total) into feeding tube every 6 (six) hours. Patient taking differently: Place 650 mg into feeding tube every 6 (six) hours as needed for mild pain. 05/21/21   Elgergawy, Silver Huguenin, MD  albuterol (PROVENTIL) (2.5 MG/3ML) 0.083% nebulizer solution Take 3 mLs (2.5 mg total) by nebulization every 4 (four) hours as needed for wheezing or shortness of breath. 05/21/21   Elgergawy, Silver Huguenin, MD  amLODipine (NORVASC) 10 MG tablet Place 1 tablet (10 mg total) into feeding tube daily. 09/23/21   Elwin Mocha, MD  Blood Glucose Monitoring Suppl (BLOOD GLUCOSE MONITOR SYSTEM) w/Device KIT Use up to 4 (four) times daily as directed 06/17/21   Elwin Mocha, MD  Blood Pressure Monitoring DEVI 1 application. by Does not apply route as needed. 06/03/21   Elwin Mocha, MD  carvedilol (COREG) 12.5 MG tablet Place 1 tablet (12.5 mg total) into feeding tube 2 (two) times daily 09/23/21   Elwin Mocha, MD  chlorhexidine (PERIDEX) 0.12 % solution Rinse and spit 15 mLs by mouth 2 (two) times daily.  Do not eat or drink for 30 minutes afterward. 12/30/21   Elwin Mocha, MD  cloNIDine (CATAPRES - DOSED IN MG/24 HR) 0.2 mg/24hr patch Place 1 patch (0.2 mg total) onto the skin once a week. 01/20/22   Elwin Mocha, MD  collagenase (SANTYL) 250 UNIT/GM ointment Apply 1 application. topically 2 (two) times daily. To  affected area Patient taking differently: Apply 1 application. topically 2 (two) times daily as needed (to affected area for wound care). 05/30/21   Elwin Mocha, MD  glucose blood [facility] VERIO) test strip Use to check blood sugar up to 4 (four) times daily as directed 06/17/21   Elwin Mocha, MD  isosorbide dinitrate (ISORDIL) 20 MG tablet Place 1 tablet (20 mg total) into feeding tube 3 (three) times daily. 09/23/21   Elwin Mocha, MD  Lancets (ONETOUCH DELICA PLUS [id number]) MISC Use to test blood sugar up to 4 (four) times daily as directed 06/17/21   Elwin Mocha, MD  losartan (COZAAR) 50 MG tablet Place 1 tablet (50 mg total) into feeding tube daily. 09/23/21   Elwin Mocha, MD  metFORMIN (GLUCOPHAGE) 500 MG tablet Take 1 tablet (500 mg total) by mouth 2 (two) times daily with a meal. 06/18/21   Elwin Mocha, MD  methocarbamol (ROBAXIN) 500 MG tablet Place 1 tablet (500 mg total) into feeding tube 3 (three) times daily. 09/23/21   Elwin Mocha, MD  metoCLOPramide (REGLAN) 5 MG/5ML  solution Take 10 mLs (10 mg total) by mouth every 6 (six) hours. 11/20/21 02/18/22  Elwin Mocha, MD  Mouthwashes (MOUTH RINSE) LIQD solution 15 mLs by Mouth Rinse route 2 times daily at 12 noon and 4 pm. 05/21/21   Elgergawy, Silver Huguenin, MD  Nutritional Supplements (FEEDING SUPPLEMENT, OSMOLITE 1.5 CAL,) LIQD Place 1,000 mLs into feeding tube continuous. 05/22/21   Elgergawy, Silver Huguenin, MD  Nutritional Supplements (FEEDING SUPPLEMENT, PROSOURCE TF,) liquid Place 45 mLs into feeding tube 2 (two) times daily. 05/21/21   Elgergawy, Silver Huguenin, MD  polyethylene glycol (MIRALAX / GLYCOLAX) 17 g packet Place 17 g into feeding tube daily as needed for mild  constipation. 06/18/21   Elwin Mocha, MD  Water For Irrigation, Sterile (FREE WATER) SOLN Place 200 mLs into feeding tube every 4 (four) hours. 05/21/21   Elgergawy, Silver Huguenin, MD      Allergies    Patient has no known allergies.    Review of Systems   Review of Systems  Physical Exam Updated Vital Signs Pulse (!) 105   Resp 20   SpO2 99%  Physical Exam Vitals and nursing note reviewed.  Constitutional:      General: He is not in acute distress.    Appearance: He is well-developed.  HENT:     Head: Normocephalic and atraumatic.     Mouth/Throat:     Mouth: Mucous membranes are moist.  Eyes:     General: Vision grossly intact. Gaze aligned appropriately.     Extraocular Movements: Extraocular movements intact.     Conjunctiva/sclera: Conjunctivae normal.  Cardiovascular:     Rate and Rhythm: Normal rate and regular rhythm.     Pulses: Normal pulses.     Heart sounds: Normal heart sounds, S1 normal and S2 normal. No murmur heard.    No friction rub. No gallop.  Pulmonary:     Effort: Pulmonary effort is normal. No respiratory distress.     Breath sounds: Normal breath sounds.  Abdominal:     Palpations: Abdomen is soft.     Tenderness: There is no abdominal tenderness. There is no guarding or rebound.     Hernia: No hernia is present.  Musculoskeletal:        General: No swelling.     Cervical back: Full passive range of motion without pain, normal range of motion and neck supple. No pain with movement, spinous process tenderness or muscular tenderness. Normal range of motion.     Right lower leg: No edema.     Left lower leg: No edema.     Comments: Upper extremity contractions with elbow is held flexed  Skin:    General: Skin is warm and dry.     Capillary Refill: Capillary refill takes less than 2 seconds.     Findings: No ecchymosis, erythema, lesion or wound.  Neurological:     Mental Status: Mental status is at baseline.     Motor: No weakness or abnormal  muscle tone.     Comments: Non-verbal, upper extremity contractions at baseline     ED Results / Procedures / Treatments   Labs (all labs ordered are listed, but only abnormal results are displayed) Labs Reviewed  CBC WITH DIFFERENTIAL/PLATELET - Abnormal; Notable for the following components:      Result Value   Hemoglobin 12.8 (*)    MCV 79.4 (*)    MCH 24.2 (*)    RDW 15.6 (*)    All other components within  normal limits  COMPREHENSIVE METABOLIC PANEL - Abnormal; Notable for the following components:   Glucose, Bld 134 (*)    BUN 31 (*)    Albumin 3.1 (*)    All other components within normal limits  LIPASE, BLOOD - Abnormal; Notable for the following components:   Lipase 59 (*)    All other components within normal limits  RESP PANEL BY RT-PCR (RSV, FLU A&B, COVID)  [id number]    EKG EKG Interpretation  Date/Time:  Wednesday February 03 2022 01:42:25 EST Ventricular Rate:  104 PR Interval:  152 QRS Duration: 85 QT Interval:  336 QTC Calculation: 442 R Axis:   26 Text Interpretation: Sinus tachycardia Borderline ST elevation, anterior leads Confirmed by Orpah Greek ([id number]) on 02/03/2022 2:10:24 AM  Radiology DG Chest Port 1 View  Result Date: 02/03/2022 CLINICAL DATA:  Shortness of breath EXAM: PORTABLE CHEST 1 VIEW COMPARISON:  07/09/2021 FINDINGS: Tracheostomy tube is noted in satisfactory position. Cardiac shadow is stable. Lungs are clear. Upper abdomen is within normal limits. No bony abnormality is seen. IMPRESSION: No acute abnormality noted. Electronically Signed   By: Inez Catalina M.D.   On: 02/03/2022 01:45    Procedures Procedures    Medications Ordered in ED Medications  ondansetron (ZOFRAN) injection 4 mg (4 mg Intravenous Given 02/03/22 0157)    ED Course/ Medical Decision Making/ A&P                           Medical Decision Making Amount and/or Complexity of Data Reviewed Labs: ordered. Radiology: ordered.  Risk Prescription drug  management.   To the ER by EMS from home where he is very well cared for by his family.  He had a coughing episode tonight.  It is likely that there was a mucous plug that caused the cough, he has not had much of a cough here in the department.  He appears well.  Vital signs are unremarkable.  Lab work normal, chest x-ray clear.  Oxygen saturations are normal.  Nothing to indicate aspiration at this point.  Lurline Idol was replaced by respiratory therapy at arrival and he has done well.  No indication for admission.        Final Clinical Impression(s) / ED Diagnoses Final diagnoses:  Complication of tracheostomy tube [facility])    Rx / DC Orders ED Discharge Orders     None         Orpah Greek, MD 02/03/22 [phone]

## 2022-02-03 NOTE — ED Notes (Signed)
Requested Network engineer to call [facility] for pt at this time

## 2022-02-03 NOTE — ED Notes (Signed)
Trach replaced with a size 4 by resp at this time

## 2022-02-03 NOTE — ED Notes (Signed)
ED provider at bedside.

## 2022-02-03 NOTE — ED Notes (Signed)
[facility] arrived for transfer

## 2022-02-04 ENCOUNTER — Other Ambulatory Visit (HOSPITAL_COMMUNITY): Payer: Self-pay

## 2022-02-09 ENCOUNTER — Other Ambulatory Visit (HOSPITAL_COMMUNITY): Payer: Self-pay

## 2022-02-09 MED ORDER — GUAIFENESIN 100 MG/5ML PO LIQD
5.0000 mL | Freq: Four times a day (QID) | ORAL | 0 refills | Status: AC
  Filled 2022-02-09: qty 708, 36d supply, fill #0

## 2022-02-09 NOTE — Telephone Encounter (Signed)
Per nurse pt with thick secretions since trach change. Pt now with smallest adult trach will need backup peds trach.4  Start robitussin 100mg  q6  Will f/u later this week.  Elwin Mocha, MD

## 2022-02-11 ENCOUNTER — Other Ambulatory Visit (HOSPITAL_COMMUNITY): Payer: Self-pay

## 2022-02-11 ENCOUNTER — Other Ambulatory Visit: Payer: Self-pay

## 2022-02-11 ENCOUNTER — Other Ambulatory Visit: Payer: Self-pay | Admitting: Family Medicine

## 2022-02-12 ENCOUNTER — Other Ambulatory Visit (HOSPITAL_COMMUNITY): Payer: Self-pay

## 2022-02-12 MED ORDER — ONETOUCH VERIO VI STRP
ORAL_STRIP | Freq: Four times a day (QID) | 0 refills | Status: DC
  Filled 2022-02-12: qty 350, 88d supply, fill #0

## 2022-02-12 NOTE — Progress Notes (Incomplete)
   Established Patient Office Visit  Subjective   Patient ID: Stephen Delgado, male    DOB: Dec 17, 1965  Age: 57 y.o. MRN: [id number]  Chief Complaint  Patient presents with  . bed sore    Home visit for bed sore on coccyx area.     HPI  {History (Optional):23778}  ROS    Objective:     BP 122/74   Pulse 84   Temp 98 F (36.7 C)   SpO2 95%  {Vitals History (Optional):23777}  Physical Exam   No results found for any visits on 12/30/21.  {Labs (Optional):23779}  The ASCVD Risk score (Arnett DK, et al., 2019) failed to calculate for the following reasons:   The patient has a prior MI or stroke diagnosis    Assessment & Plan:   Problem List Items Addressed This Visit       Endocrine   Diabetes mellitus ([place])     Other   Tracheostomy dependent ([place])   Other Visit Diagnoses     Skin ulcer of sacrum, limited to breakdown of skin ([place])    -  Primary   On tube feeding diet            Return in about 1 month (around 01/29/2022) for Skin ulcer follow-up.    Elwin Mocha, MD

## 2022-02-15 ENCOUNTER — Other Ambulatory Visit (HOSPITAL_COMMUNITY): Payer: Self-pay

## 2022-02-16 ENCOUNTER — Ambulatory Visit: Payer: Commercial Managed Care - HMO | Admitting: Family Medicine

## 2022-02-16 ENCOUNTER — Other Ambulatory Visit (HOSPITAL_COMMUNITY): Payer: Self-pay

## 2022-02-17 ENCOUNTER — Other Ambulatory Visit (HOSPITAL_COMMUNITY): Payer: Self-pay

## 2022-02-18 ENCOUNTER — Other Ambulatory Visit (HOSPITAL_COMMUNITY): Payer: Self-pay

## 2022-02-19 ENCOUNTER — Other Ambulatory Visit (HOSPITAL_COMMUNITY): Payer: Self-pay

## 2022-02-24 ENCOUNTER — Other Ambulatory Visit (HOSPITAL_COMMUNITY): Payer: Self-pay

## 2022-03-02 ENCOUNTER — Ambulatory Visit: Payer: Commercial Managed Care - HMO | Admitting: Family Medicine

## 2022-03-02 VITALS — BP 125/89 | HR 98 | Temp 97.7°F | Resp 12

## 2022-03-02 DIAGNOSIS — L89323 Pressure ulcer of left buttock, stage 3: Secondary | ICD-10-CM

## 2022-03-02 DIAGNOSIS — Z930 Tracheostomy status: Secondary | ICD-10-CM

## 2022-03-02 DIAGNOSIS — I10 Essential (primary) hypertension: Secondary | ICD-10-CM

## 2022-03-03 ENCOUNTER — Other Ambulatory Visit (HOSPITAL_COMMUNITY): Payer: Self-pay

## 2022-03-03 MED ORDER — HYDRALAZINE HCL 100 MG PO TABS
50.0000 mg | ORAL_TABLET | Freq: Three times a day (TID) | ORAL | 2 refills | Status: DC | PRN
  Filled 2022-03-03: qty 45, 30d supply, fill #0

## 2022-03-03 NOTE — Progress Notes (Unsigned)
   Established Patient Office Visit  Subjective   Patient ID: Stephen Delgado, male    DOB: [birth year]  Age: 57 y.o. MRN: [id number]  No chief complaint on file.   HPI  {History (Optional):23778}  Review of Systems  Reason unable to perform ROS: Anoxic brain injury, per family no issues.      Objective:     There were no vitals taken for this visit. {Vitals History (Optional):23777}  Physical Exam Vitals and nursing note reviewed.  Constitutional:      General: He is not in acute distress.    Appearance: Normal appearance. He is normal weight. He is not ill-appearing.  HENT:     Head: Normocephalic and atraumatic.     Nose: Nose normal.     Mouth/Throat:     Mouth: Mucous membranes are moist.  Eyes:     Pupils: Pupils are equal, round, and reactive to light.  Cardiovascular:     Rate and Rhythm: Normal rate and regular rhythm.     Pulses: Normal pulses.     Heart sounds: Normal heart sounds.  Pulmonary:     Effort: Pulmonary effort is normal.     Breath sounds: Normal breath sounds.  Abdominal:     General: Abdomen is flat. Bowel sounds are normal. There is no distension.     Palpations: Abdomen is soft.     Tenderness: There is no abdominal tenderness.     Comments: G tube with binder  Skin:    General: Skin is warm and dry.     Capillary Refill: Capillary refill takes less than 2 seconds.     Comments: Stage 2 buttock ulcer on L, no drainage, meriplex dressing  Neurological:     Mental Status: Mental status is at baseline.     Comments: Nonverbal, contractures in upper extremtites, eye open  Psychiatric:     Comments: Unable to assess       No results found for any visits on 03/02/22.  {Labs (Optional):23779}  The ASCVD Risk score (Arnett DK, et al., 2019) failed to calculate for the following reasons:   The patient has a prior MI or stroke diagnosis    Assessment & Plan:   Problem List Items Addressed This Visit       Cardiovascular and  Mediastinum   Primary hypertension   Relevant Medications   hydrALAZINE (APRESOLINE) 100 MG tablet     Other   Tracheostomy dependent ([place])   Other Visit Diagnoses     Pressure injury of left buttock, stage 3 ([place])    -  Primary       No follow-ups on file.    Elwin Mocha, MD

## 2022-03-05 ENCOUNTER — Other Ambulatory Visit (HOSPITAL_COMMUNITY): Payer: Self-pay

## 2022-03-15 ENCOUNTER — Other Ambulatory Visit (HOSPITAL_COMMUNITY): Payer: Self-pay

## 2022-03-15 ENCOUNTER — Other Ambulatory Visit: Payer: Self-pay | Admitting: Family Medicine

## 2022-03-18 ENCOUNTER — Other Ambulatory Visit (HOSPITAL_COMMUNITY): Payer: Self-pay

## 2022-03-18 ENCOUNTER — Other Ambulatory Visit: Payer: Self-pay | Admitting: Family Medicine

## 2022-03-19 ENCOUNTER — Other Ambulatory Visit (HOSPITAL_COMMUNITY): Payer: Self-pay

## 2022-03-19 MED ORDER — CHLORHEXIDINE GLUCONATE 0.12 % MT SOLN
15.0000 mL | Freq: Two times a day (BID) | OROMUCOSAL | 0 refills | Status: DC
  Filled 2022-03-19: qty 473, 16d supply, fill #0

## 2022-03-22 ENCOUNTER — Other Ambulatory Visit (HOSPITAL_COMMUNITY): Payer: Self-pay

## 2022-03-24 NOTE — Telephone Encounter (Signed)
d 

## 2022-03-26 ENCOUNTER — Ambulatory Visit: Payer: Commercial Managed Care - HMO | Admitting: Family Medicine

## 2022-03-30 ENCOUNTER — Other Ambulatory Visit (HOSPITAL_COMMUNITY): Payer: Self-pay

## 2022-03-30 ENCOUNTER — Other Ambulatory Visit: Payer: Self-pay

## 2022-03-30 MED ORDER — DOXYCYCLINE CALCIUM 50 MG/5ML PO SYRP
100.0000 mg | ORAL_SOLUTION | Freq: Two times a day (BID) | ORAL | 0 refills | Status: AC
  Filled 2022-03-30: qty 200, 10d supply, fill #0

## 2022-03-30 MED ORDER — ONDANSETRON HCL 4 MG PO TABS
4.0000 mg | ORAL_TABLET | Freq: Three times a day (TID) | ORAL | 0 refills | Status: DC | PRN
  Filled 2022-03-30: qty 20, 7d supply, fill #0

## 2022-03-30 MED ORDER — BIOTENE DRY MOUTH MT LIQD
15.0000 mL | OROMUCOSAL | 8 refills | Status: DC | PRN
  Filled 2022-03-30: qty 237, fill #0

## 2022-03-30 NOTE — Progress Notes (Unsigned)
   Established Patient Office Visit  Subjective   Patient ID: Stephen Delgado, male    DOB: [birth year]  Age: 57 y.o. MRN: [id number]  No chief complaint on file.   HPI  {History (Optional):23778}  ROS    Objective:     There were no vitals taken for this visit. {Vitals History (Optional):23777}  Physical Exam   No results found for any visits on 03/26/22.  {Labs (Optional):23779}  The ASCVD Risk score (Arnett DK, et al., 2019) failed to calculate for the following reasons:   The patient has a prior MI or stroke diagnosis    Assessment & Plan:   Problem List Items Addressed This Visit       Digestive   Vomiting in adult   Relevant Medications   ondansetron (ZOFRAN) 4 MG tablet   Other Visit Diagnoses     Dry mouth    -  Primary   Relevant Medications   antiseptic oral rinse (BIOTENE) LIQD   Cellulitis of buttock       Relevant Medications   doxycycline (VIBRAMYCIN) 50 MG/5ML SYRP   Pressure ulcer of sacral region, stage 2 (HCC)           No follow-ups on file.    Elwin Mocha, MD

## 2022-03-31 ENCOUNTER — Other Ambulatory Visit (HOSPITAL_COMMUNITY): Payer: Self-pay

## 2022-04-02 ENCOUNTER — Other Ambulatory Visit (HOSPITAL_COMMUNITY): Payer: Self-pay

## 2022-04-05 ENCOUNTER — Other Ambulatory Visit (HOSPITAL_COMMUNITY): Payer: Self-pay

## 2022-04-05 MED ORDER — DOXYCYCLINE HYCLATE 100 MG PO TABS
ORAL_TABLET | ORAL | 0 refills | Status: DC
  Filled 2022-04-05: qty 20, 10d supply, fill #0

## 2022-04-06 ENCOUNTER — Other Ambulatory Visit (HOSPITAL_COMMUNITY): Payer: Self-pay

## 2022-04-13 ENCOUNTER — Other Ambulatory Visit (HOSPITAL_COMMUNITY): Payer: Self-pay

## 2022-04-13 ENCOUNTER — Other Ambulatory Visit: Payer: Self-pay

## 2022-04-13 ENCOUNTER — Other Ambulatory Visit: Payer: Self-pay | Admitting: Family Medicine

## 2022-04-13 DIAGNOSIS — E119 Type 2 diabetes mellitus without complications: Secondary | ICD-10-CM

## 2022-04-13 MED ORDER — METOCLOPRAMIDE HCL 5 MG/5ML PO SOLN
10.0000 mg | Freq: Four times a day (QID) | ORAL | 2 refills | Status: DC
  Filled 2022-04-13: qty 1051, 26d supply, fill #0
  Filled 2022-04-13: qty 149, 4d supply, fill #0
  Filled 2022-06-17: qty 1200, 30d supply, fill #1
  Filled 2022-08-18: qty 1200, 30d supply, fill #0

## 2022-04-13 MED ORDER — METFORMIN HCL 500 MG PO TABS
500.0000 mg | ORAL_TABLET | Freq: Two times a day (BID) | ORAL | 3 refills | Status: DC
  Filled 2022-04-13 – 2022-06-10 (×2): qty 180, 90d supply, fill #0

## 2022-04-13 MED ORDER — CLONIDINE 0.2 MG/24HR TD PTWK
0.2000 mg | MEDICATED_PATCH | TRANSDERMAL | 0 refills | Status: DC
  Filled 2022-04-13: qty 12, 84d supply, fill #0

## 2022-04-14 ENCOUNTER — Other Ambulatory Visit (HOSPITAL_COMMUNITY): Payer: Self-pay

## 2022-04-15 ENCOUNTER — Other Ambulatory Visit (HOSPITAL_COMMUNITY): Payer: Self-pay

## 2022-04-29 ENCOUNTER — Other Ambulatory Visit: Payer: Self-pay

## 2022-04-29 DIAGNOSIS — N390 Urinary tract infection, site not specified: Secondary | ICD-10-CM

## 2022-04-29 LAB — POCT URINALYSIS DIPSTICK
Bilirubin, UA: NEGATIVE
Blood, UA: NEGATIVE
Glucose, UA: NEGATIVE
Leukocytes, UA: NEGATIVE
Protein, UA: POSITIVE — AB
Spec Grav, UA: <=1.005 — AB (ref 1.010–1.025)
Urobilinogen, UA: 0.2 E.U./dL
pH, UA: 8.5 — AB (ref 5.0–8.0)

## 2022-04-30 ENCOUNTER — Encounter: Payer: Self-pay | Admitting: Family Medicine

## 2022-04-30 MED ORDER — CEPHALEXIN 250 MG/5ML PO SUSR
500.0000 mg | Freq: Two times a day (BID) | ORAL | 0 refills | Status: AC
  Filled 2022-04-30: qty 200, 10d supply, fill #0

## 2022-05-01 ENCOUNTER — Other Ambulatory Visit (HOSPITAL_COMMUNITY): Payer: Self-pay

## 2022-05-01 LAB — SPECIMEN STATUS REPORT

## 2022-05-01 LAB — URINE CULTURE

## 2022-05-03 ENCOUNTER — Other Ambulatory Visit (HOSPITAL_COMMUNITY): Payer: Self-pay

## 2022-05-04 ENCOUNTER — Other Ambulatory Visit (HOSPITAL_COMMUNITY): Payer: Self-pay

## 2022-05-05 ENCOUNTER — Other Ambulatory Visit: Payer: Self-pay | Admitting: Family Medicine

## 2022-05-13 ENCOUNTER — Encounter (HOSPITAL_BASED_OUTPATIENT_CLINIC_OR_DEPARTMENT_OTHER): Payer: Commercial Managed Care - HMO | Admitting: Internal Medicine

## 2022-05-18 ENCOUNTER — Other Ambulatory Visit (HOSPITAL_COMMUNITY): Payer: Self-pay

## 2022-05-20 ENCOUNTER — Encounter (HOSPITAL_BASED_OUTPATIENT_CLINIC_OR_DEPARTMENT_OTHER): Payer: Commercial Managed Care - HMO | Admitting: Internal Medicine

## 2022-05-24 ENCOUNTER — Other Ambulatory Visit (HOSPITAL_BASED_OUTPATIENT_CLINIC_OR_DEPARTMENT_OTHER): Payer: Self-pay

## 2022-05-24 ENCOUNTER — Other Ambulatory Visit (HOSPITAL_COMMUNITY): Payer: Self-pay

## 2022-05-24 ENCOUNTER — Other Ambulatory Visit: Payer: Self-pay | Admitting: Family Medicine

## 2022-05-25 ENCOUNTER — Other Ambulatory Visit (HOSPITAL_COMMUNITY): Payer: Self-pay

## 2022-05-26 ENCOUNTER — Other Ambulatory Visit (HOSPITAL_COMMUNITY): Payer: Self-pay

## 2022-05-26 ENCOUNTER — Ambulatory Visit (HOSPITAL_BASED_OUTPATIENT_CLINIC_OR_DEPARTMENT_OTHER): Payer: Commercial Managed Care - HMO | Admitting: Physician Assistant

## 2022-05-26 MED ORDER — GLUCOSE BLOOD VI STRP
ORAL_STRIP | 5 refills | Status: DC
  Filled 2022-05-26: qty 100, 50d supply, fill #0
  Filled 2022-07-14: qty 100, 50d supply, fill #1

## 2022-05-27 ENCOUNTER — Other Ambulatory Visit: Payer: Self-pay

## 2022-05-28 ENCOUNTER — Other Ambulatory Visit (HOSPITAL_COMMUNITY): Payer: Self-pay

## 2022-05-28 ENCOUNTER — Other Ambulatory Visit: Payer: Self-pay

## 2022-06-01 ENCOUNTER — Other Ambulatory Visit (HOSPITAL_COMMUNITY): Payer: Self-pay

## 2022-06-04 ENCOUNTER — Ambulatory Visit (HOSPITAL_BASED_OUTPATIENT_CLINIC_OR_DEPARTMENT_OTHER): Payer: Commercial Managed Care - HMO | Admitting: General Surgery

## 2022-06-08 ENCOUNTER — Ambulatory Visit (HOSPITAL_BASED_OUTPATIENT_CLINIC_OR_DEPARTMENT_OTHER): Payer: Commercial Managed Care - HMO | Admitting: Internal Medicine

## 2022-06-10 ENCOUNTER — Ambulatory Visit (HOSPITAL_BASED_OUTPATIENT_CLINIC_OR_DEPARTMENT_OTHER): Payer: Commercial Managed Care - HMO | Admitting: Internal Medicine

## 2022-06-10 ENCOUNTER — Other Ambulatory Visit (HOSPITAL_COMMUNITY): Payer: Self-pay

## 2022-06-15 ENCOUNTER — Ambulatory Visit (HOSPITAL_BASED_OUTPATIENT_CLINIC_OR_DEPARTMENT_OTHER): Payer: Commercial Managed Care - HMO | Admitting: General Surgery

## 2022-06-16 ENCOUNTER — Other Ambulatory Visit (HOSPITAL_COMMUNITY): Payer: Self-pay

## 2022-06-16 MED ORDER — LACTULOSE 20 GM/30ML PO SOLN
15.0000 mL | Freq: Every day | ORAL | 0 refills | Status: DC
  Filled 2022-06-16: qty 473, 30d supply, fill #0

## 2022-06-17 ENCOUNTER — Other Ambulatory Visit (HOSPITAL_COMMUNITY): Payer: Self-pay

## 2022-06-22 ENCOUNTER — Other Ambulatory Visit (HOSPITAL_COMMUNITY): Payer: Self-pay

## 2022-06-23 ENCOUNTER — Other Ambulatory Visit (HOSPITAL_COMMUNITY): Payer: Self-pay

## 2022-06-23 ENCOUNTER — Other Ambulatory Visit: Payer: Self-pay

## 2022-06-29 ENCOUNTER — Other Ambulatory Visit (HOSPITAL_COMMUNITY): Payer: Self-pay

## 2022-06-30 ENCOUNTER — Other Ambulatory Visit: Payer: Self-pay

## 2022-06-30 ENCOUNTER — Other Ambulatory Visit (HOSPITAL_COMMUNITY): Payer: Self-pay

## 2022-07-14 ENCOUNTER — Other Ambulatory Visit (HOSPITAL_COMMUNITY): Payer: Self-pay

## 2022-07-26 ENCOUNTER — Other Ambulatory Visit (HOSPITAL_COMMUNITY): Payer: Self-pay

## 2022-08-10 ENCOUNTER — Other Ambulatory Visit (HOSPITAL_COMMUNITY): Payer: Self-pay

## 2022-08-18 ENCOUNTER — Other Ambulatory Visit (HOSPITAL_COMMUNITY): Payer: Self-pay

## 2022-08-19 ENCOUNTER — Other Ambulatory Visit (HOSPITAL_COMMUNITY): Payer: Self-pay

## 2022-08-19 MED ORDER — GLUCOSE BLOOD VI STRP
ORAL_STRIP | 1 refills | Status: DC
  Filled 2022-08-23: qty 100, 50d supply, fill #0
  Filled 2022-10-07 – 2022-10-11 (×2): qty 100, 50d supply, fill #1
  Filled 2022-11-29: qty 100, 50d supply, fill #2
  Filled 2023-01-13: qty 100, 50d supply, fill #3

## 2022-08-20 ENCOUNTER — Other Ambulatory Visit (HOSPITAL_COMMUNITY): Payer: Self-pay

## 2022-08-20 MED ORDER — HYDRALAZINE HCL 100 MG PO TABS
50.0000 mg | ORAL_TABLET | Freq: Three times a day (TID) | ORAL | 0 refills | Status: DC
  Filled 2022-08-20: qty 135, 90d supply, fill #0

## 2022-08-20 MED ORDER — CLONIDINE 0.2 MG/24HR TD PTWK
0.2000 mg | MEDICATED_PATCH | TRANSDERMAL | 1 refills | Status: DC
  Filled 2022-08-20: qty 4, 28d supply, fill #0
  Filled 2022-09-15: qty 4, 28d supply, fill #1

## 2022-08-20 MED ORDER — LANCETS MISC
0 refills | Status: DC
  Filled 2022-08-20: qty 100, 90d supply, fill #0

## 2022-08-20 MED ORDER — AMLODIPINE BESYLATE 10 MG PO TABS
10.0000 mg | ORAL_TABLET | Freq: Every day | ORAL | 0 refills | Status: DC
  Filled 2022-08-20 – 2022-09-06 (×2): qty 30, 30d supply, fill #0

## 2022-08-20 MED ORDER — LOSARTAN POTASSIUM 50 MG PO TABS
50.0000 mg | ORAL_TABLET | Freq: Every day | ORAL | 0 refills | Status: DC
  Filled 2022-08-20 – 2022-09-06 (×2): qty 30, 30d supply, fill #0

## 2022-08-20 MED ORDER — VIBRAMYCIN 50 MG/5ML PO SYRP
100.0000 mg | ORAL_SOLUTION | Freq: Two times a day (BID) | ORAL | 0 refills | Status: DC
Start: 2022-07-28 — End: 2022-12-29
  Filled 2022-08-20: qty 200, 10d supply, fill #0

## 2022-08-20 MED ORDER — METFORMIN HCL 500 MG PO TABS
500.0000 mg | ORAL_TABLET | Freq: Two times a day (BID) | ORAL | 2 refills | Status: DC
  Filled 2022-08-20: qty 540, 270d supply, fill #0
  Filled 2022-09-29: qty 180, 90d supply, fill #0
  Filled 2022-12-13 – 2022-12-20 (×2): qty 180, 90d supply, fill #1

## 2022-08-20 MED ORDER — CARVEDILOL 12.5 MG PO TABS
12.5000 mg | ORAL_TABLET | Freq: Two times a day (BID) | ORAL | 0 refills | Status: DC
  Filled 2022-08-20 – 2022-09-06 (×2): qty 60, 30d supply, fill #0

## 2022-08-20 MED ORDER — ISOSORBIDE DINITRATE 20 MG PO TABS
20.0000 mg | ORAL_TABLET | Freq: Three times a day (TID) | ORAL | 0 refills | Status: DC
  Filled 2022-08-20 – 2022-09-15 (×3): qty 90, 30d supply, fill #0

## 2022-08-23 ENCOUNTER — Other Ambulatory Visit: Payer: Self-pay

## 2022-08-23 ENCOUNTER — Other Ambulatory Visit (HOSPITAL_COMMUNITY): Payer: Self-pay

## 2022-09-02 ENCOUNTER — Other Ambulatory Visit (HOSPITAL_COMMUNITY): Payer: Self-pay

## 2022-09-03 ENCOUNTER — Other Ambulatory Visit (HOSPITAL_COMMUNITY): Payer: Self-pay

## 2022-09-03 MED ORDER — ALBUTEROL SULFATE (2.5 MG/3ML) 0.083% IN NEBU
3.0000 mL | INHALATION_SOLUTION | RESPIRATORY_TRACT | 12 refills | Status: AC | PRN
Start: 2022-09-03 — End: ?
  Filled 2022-09-03: qty 75, 7d supply, fill #0
  Filled 2022-10-21: qty 75, 7d supply, fill #1
  Filled 2023-01-13: qty 75, 7d supply, fill #2

## 2022-09-06 ENCOUNTER — Other Ambulatory Visit: Payer: Self-pay

## 2022-09-06 ENCOUNTER — Other Ambulatory Visit (HOSPITAL_COMMUNITY): Payer: Self-pay

## 2022-09-15 ENCOUNTER — Other Ambulatory Visit (HOSPITAL_COMMUNITY): Payer: Self-pay

## 2022-09-16 ENCOUNTER — Other Ambulatory Visit (HOSPITAL_COMMUNITY): Payer: Self-pay

## 2022-09-16 MED ORDER — METHOCARBAMOL 500 MG PO TABS
500.0000 mg | ORAL_TABLET | Freq: Three times a day (TID) | ORAL | 0 refills | Status: DC | PRN
  Filled 2022-09-16: qty 90, 30d supply, fill #0

## 2022-09-22 ENCOUNTER — Other Ambulatory Visit (HOSPITAL_COMMUNITY): Payer: Self-pay

## 2022-09-29 ENCOUNTER — Other Ambulatory Visit (HOSPITAL_COMMUNITY): Payer: Self-pay

## 2022-09-29 ENCOUNTER — Other Ambulatory Visit: Payer: Self-pay

## 2022-10-02 ENCOUNTER — Other Ambulatory Visit (HOSPITAL_COMMUNITY): Payer: Self-pay

## 2022-10-02 MED ORDER — AMLODIPINE BESYLATE 10 MG PO TABS
10.0000 mg | ORAL_TABLET | Freq: Every day | ORAL | 0 refills | Status: DC
  Filled 2022-10-02: qty 30, 30d supply, fill #0

## 2022-10-02 MED ORDER — LOSARTAN POTASSIUM 50 MG PO TABS
50.0000 mg | ORAL_TABLET | Freq: Every day | ORAL | 0 refills | Status: DC
  Filled 2022-10-02: qty 30, 30d supply, fill #0

## 2022-10-05 ENCOUNTER — Other Ambulatory Visit (HOSPITAL_COMMUNITY): Payer: Self-pay

## 2022-10-07 ENCOUNTER — Other Ambulatory Visit (HOSPITAL_COMMUNITY): Payer: Self-pay

## 2022-10-08 ENCOUNTER — Other Ambulatory Visit (HOSPITAL_COMMUNITY): Payer: Self-pay

## 2022-10-08 ENCOUNTER — Other Ambulatory Visit: Payer: Self-pay

## 2022-10-08 MED ORDER — CARVEDILOL 12.5 MG PO TABS
12.5000 mg | ORAL_TABLET | Freq: Two times a day (BID) | ORAL | 0 refills | Status: DC
  Filled 2022-10-08: qty 60, 30d supply, fill #0

## 2022-10-08 MED ORDER — METOCLOPRAMIDE HCL 5 MG/5ML PO SOLN
10.0000 mg | Freq: Four times a day (QID) | ORAL | 1 refills | Status: DC
  Filled 2022-10-08: qty 1000, 25d supply, fill #0
  Filled 2022-11-19: qty 605, 15d supply, fill #1
  Filled 2022-11-19: qty 395, 10d supply, fill #1

## 2022-10-11 ENCOUNTER — Other Ambulatory Visit (HOSPITAL_COMMUNITY): Payer: Self-pay

## 2022-10-18 ENCOUNTER — Other Ambulatory Visit (HOSPITAL_COMMUNITY): Payer: Self-pay

## 2022-10-25 ENCOUNTER — Other Ambulatory Visit (HOSPITAL_COMMUNITY): Payer: Self-pay

## 2022-10-25 ENCOUNTER — Encounter (HOSPITAL_COMMUNITY): Payer: Self-pay

## 2022-10-26 ENCOUNTER — Other Ambulatory Visit (HOSPITAL_COMMUNITY): Payer: Self-pay

## 2022-10-27 ENCOUNTER — Other Ambulatory Visit (HOSPITAL_COMMUNITY): Payer: Self-pay

## 2022-10-29 ENCOUNTER — Other Ambulatory Visit (HOSPITAL_COMMUNITY): Payer: Self-pay

## 2022-11-19 ENCOUNTER — Other Ambulatory Visit (HOSPITAL_COMMUNITY): Payer: Self-pay

## 2022-11-25 ENCOUNTER — Other Ambulatory Visit (HOSPITAL_COMMUNITY): Payer: Self-pay

## 2022-11-25 MED ORDER — AMLODIPINE BESYLATE 10 MG PO TABS
10.0000 mg | ORAL_TABLET | Freq: Every day | ORAL | 0 refills | Status: DC
  Filled 2022-11-25: qty 30, 30d supply, fill #0

## 2022-11-25 MED ORDER — CLONIDINE 0.2 MG/24HR TD PTWK
0.2000 mg | MEDICATED_PATCH | TRANSDERMAL | 0 refills | Status: DC
  Filled 2022-11-25 – 2022-12-20 (×2): qty 4, 28d supply, fill #0

## 2022-11-25 MED ORDER — LOSARTAN POTASSIUM 50 MG PO TABS
50.0000 mg | ORAL_TABLET | Freq: Every day | ORAL | 0 refills | Status: DC
  Filled 2022-11-25: qty 30, 30d supply, fill #0

## 2022-11-29 ENCOUNTER — Other Ambulatory Visit (HOSPITAL_COMMUNITY): Payer: Self-pay

## 2022-11-29 ENCOUNTER — Other Ambulatory Visit: Payer: Self-pay

## 2022-12-13 ENCOUNTER — Other Ambulatory Visit (HOSPITAL_COMMUNITY): Payer: Self-pay

## 2022-12-15 ENCOUNTER — Other Ambulatory Visit (HOSPITAL_COMMUNITY): Payer: Self-pay

## 2022-12-15 MED ORDER — METOCLOPRAMIDE HCL 10 MG/10ML PO SOLN
10.0000 mg | Freq: Four times a day (QID) | ORAL | 1 refills | Status: DC
  Filled 2022-12-15: qty 946, 24d supply, fill #0

## 2022-12-15 MED ORDER — AMLODIPINE BESYLATE 10 MG PO TABS
10.0000 mg | ORAL_TABLET | Freq: Every day | ORAL | 1 refills | Status: DC
  Filled 2022-12-15 – 2022-12-20 (×3): qty 90, 90d supply, fill #0

## 2022-12-15 MED ORDER — ISOSORBIDE DINITRATE 20 MG PO TABS
20.0000 mg | ORAL_TABLET | Freq: Three times a day (TID) | ORAL | 1 refills | Status: DC
  Filled 2022-12-15: qty 270, 90d supply, fill #0

## 2022-12-15 MED ORDER — METFORMIN HCL 500 MG PO TABS
500.0000 mg | ORAL_TABLET | Freq: Two times a day (BID) | ORAL | 1 refills | Status: DC
  Filled 2022-12-15: qty 180, 90d supply, fill #0

## 2022-12-15 MED ORDER — CARVEDILOL 12.5 MG PO TABS
12.5000 mg | ORAL_TABLET | Freq: Two times a day (BID) | ORAL | 0 refills | Status: DC
  Filled 2022-12-15 – 2022-12-20 (×3): qty 60, 30d supply, fill #0

## 2022-12-15 MED ORDER — CARVEDILOL 12.5 MG PO TABS
12.5000 mg | ORAL_TABLET | Freq: Two times a day (BID) | ORAL | 1 refills | Status: DC
  Filled 2022-12-15 – 2022-12-17 (×4): qty 180, 90d supply, fill #0

## 2022-12-15 MED ORDER — SENNA 8.6 MG PO TABS
8.6000 mg | ORAL_TABLET | Freq: Every evening | ORAL | 1 refills | Status: DC | PRN
  Filled 2022-12-15: qty 90, 90d supply, fill #0

## 2022-12-15 MED ORDER — FLUZONE 0.5 ML IM SUSY
0.5000 mL | PREFILLED_SYRINGE | Freq: Once | INTRAMUSCULAR | 0 refills | Status: AC
  Filled 2022-12-15: qty 0.5, 1d supply, fill #0

## 2022-12-15 MED ORDER — FLUZONE 0.5 ML IM SUSY: 0.5000 mL | PREFILLED_SYRINGE | Freq: Once | INTRAMUSCULAR | 0 refills | Status: AC

## 2022-12-15 MED ORDER — METOCLOPRAMIDE HCL 5 MG/5ML PO SOLN
10.0000 mg | Freq: Four times a day (QID) | ORAL | 2 refills | Status: DC
  Filled 2022-12-15 (×2): qty 1200, 30d supply, fill #0

## 2022-12-15 MED ORDER — METOCLOPRAMIDE HCL 10 MG/10ML PO SOLN
5.0000 mg | Freq: Three times a day (TID) | ORAL | 1 refills | Status: DC | PRN
  Filled 2022-12-15: qty 1350, 90d supply, fill #0

## 2022-12-15 MED ORDER — METHOCARBAMOL 500 MG PO TABS
500.0000 mg | ORAL_TABLET | Freq: Three times a day (TID) | ORAL | 1 refills | Status: DC
  Filled 2022-12-15 – 2022-12-17 (×2): qty 270, 90d supply, fill #0

## 2022-12-15 MED ORDER — LOSARTAN POTASSIUM 50 MG PO TABS
50.0000 mg | ORAL_TABLET | Freq: Every day | ORAL | 1 refills | Status: DC
  Filled 2022-12-15 – 2022-12-20 (×3): qty 90, 90d supply, fill #0

## 2022-12-17 ENCOUNTER — Other Ambulatory Visit: Payer: Self-pay

## 2022-12-17 ENCOUNTER — Other Ambulatory Visit (HOSPITAL_COMMUNITY): Payer: Self-pay

## 2022-12-17 MED ORDER — AMLODIPINE BESYLATE 10 MG PO TABS
10.0000 mg | ORAL_TABLET | Freq: Every day | ORAL | 1 refills | Status: DC
  Filled 2022-12-17: qty 90, 90d supply, fill #0

## 2022-12-17 MED ORDER — METOCLOPRAMIDE HCL 5 MG/5ML PO SOLN: 5.0000 mg | Freq: Three times a day (TID) | ORAL | 1 refills | Status: DC

## 2022-12-17 MED ORDER — METHOCARBAMOL 500 MG PO TABS
500.0000 mg | ORAL_TABLET | Freq: Three times a day (TID) | ORAL | 1 refills | Status: DC
  Filled 2022-12-17: qty 270, 90d supply, fill #0

## 2022-12-17 MED ORDER — CARVEDILOL 12.5 MG PO TABS
12.5000 mg | ORAL_TABLET | Freq: Two times a day (BID) | ORAL | 1 refills | Status: DC
  Filled 2022-12-17: qty 180, 90d supply, fill #0

## 2022-12-17 MED ORDER — LOSARTAN POTASSIUM 50 MG PO TABS
50.0000 mg | ORAL_TABLET | Freq: Every day | ORAL | 1 refills | Status: DC
  Filled 2022-12-17: qty 90, 90d supply, fill #0

## 2022-12-17 MED ORDER — METFORMIN HCL 500 MG PO TABS
500.0000 mg | ORAL_TABLET | Freq: Two times a day (BID) | ORAL | 1 refills | Status: DC
  Filled 2022-12-17: qty 180, 90d supply, fill #0

## 2022-12-17 MED ORDER — ISOSORBIDE DINITRATE 20 MG PO TABS: 20.0000 mg | ORAL_TABLET | Freq: Three times a day (TID) | ORAL | 1 refills | Status: DC

## 2022-12-20 ENCOUNTER — Other Ambulatory Visit (HOSPITAL_COMMUNITY): Payer: Self-pay

## 2022-12-22 ENCOUNTER — Other Ambulatory Visit (HOSPITAL_COMMUNITY): Payer: Self-pay

## 2022-12-23 ENCOUNTER — Other Ambulatory Visit (HOSPITAL_COMMUNITY): Payer: Self-pay

## 2022-12-24 ENCOUNTER — Other Ambulatory Visit (HOSPITAL_COMMUNITY): Payer: Self-pay

## 2022-12-29 ENCOUNTER — Observation Stay (HOSPITAL_COMMUNITY)
Admission: EM | Admit: 2022-12-29 | Discharge: 2022-12-30 | Disposition: A | Discharge status: Home / Self Care | Payer: Commercial Managed Care - HMO | Attending: Internal Medicine | Admitting: Internal Medicine

## 2022-12-29 ENCOUNTER — Encounter (HOSPITAL_COMMUNITY): Payer: Self-pay

## 2022-12-29 ENCOUNTER — Other Ambulatory Visit: Payer: Self-pay

## 2022-12-29 ENCOUNTER — Observation Stay (HOSPITAL_COMMUNITY): Payer: Commercial Managed Care - HMO

## 2022-12-29 ENCOUNTER — Emergency Department (HOSPITAL_COMMUNITY): Payer: Commercial Managed Care - HMO

## 2022-12-29 DIAGNOSIS — R58 Hemorrhage, not elsewhere classified: Secondary | ICD-10-CM | POA: Diagnosis present

## 2022-12-29 DIAGNOSIS — Z7984 Long term (current) use of oral hypoglycemic drugs: Secondary | ICD-10-CM | POA: Insufficient documentation

## 2022-12-29 DIAGNOSIS — N183 Chronic kidney disease, stage 3 unspecified: Secondary | ICD-10-CM | POA: Diagnosis not present

## 2022-12-29 DIAGNOSIS — E1122 Type 2 diabetes mellitus with diabetic chronic kidney disease: Secondary | ICD-10-CM | POA: Diagnosis not present

## 2022-12-29 DIAGNOSIS — Z931 Gastrostomy status: Secondary | ICD-10-CM | POA: Diagnosis not present

## 2022-12-29 DIAGNOSIS — T17908A Unspecified foreign body in respiratory tract, part unspecified causing other injury, initial encounter: Principal | ICD-10-CM

## 2022-12-29 DIAGNOSIS — T17928A Food in respiratory tract, part unspecified causing other injury, initial encounter: Secondary | ICD-10-CM | POA: Insufficient documentation

## 2022-12-29 DIAGNOSIS — X58XXXA Exposure to other specified factors, initial encounter: Secondary | ICD-10-CM | POA: Diagnosis not present

## 2022-12-29 DIAGNOSIS — I6930 Unspecified sequelae of cerebral infarction: Secondary | ICD-10-CM

## 2022-12-29 DIAGNOSIS — D649 Anemia, unspecified: Secondary | ICD-10-CM | POA: Diagnosis present

## 2022-12-29 DIAGNOSIS — Z8673 Personal history of transient ischemic attack (TIA), and cerebral infarction without residual deficits: Secondary | ICD-10-CM | POA: Insufficient documentation

## 2022-12-29 DIAGNOSIS — R403 Persistent vegetative state: Secondary | ICD-10-CM

## 2022-12-29 DIAGNOSIS — Z930 Tracheostomy status: Secondary | ICD-10-CM

## 2022-12-29 DIAGNOSIS — S01511A Laceration without foreign body of lip, initial encounter: Secondary | ICD-10-CM | POA: Diagnosis not present

## 2022-12-29 DIAGNOSIS — D508 Other iron deficiency anemias: Secondary | ICD-10-CM

## 2022-12-29 DIAGNOSIS — I10 Essential (primary) hypertension: Secondary | ICD-10-CM

## 2022-12-29 DIAGNOSIS — Z79899 Other long term (current) drug therapy: Secondary | ICD-10-CM | POA: Diagnosis not present

## 2022-12-29 DIAGNOSIS — I129 Hypertensive chronic kidney disease with stage 1 through stage 4 chronic kidney disease, or unspecified chronic kidney disease: Secondary | ICD-10-CM | POA: Diagnosis not present

## 2022-12-29 LAB — CBC WITH DIFFERENTIAL/PLATELET
Abs Immature Granulocytes: 0.02 10*3/uL (ref 0.00–0.07)
Basophils Absolute: 0 10*3/uL (ref 0.0–0.1)
Basophils Relative: 0 %
Eosinophils Absolute: 0.2 10*3/uL (ref 0.0–0.5)
Eosinophils Relative: 2 %
HCT: 42.7 % (ref 39.0–52.0)
Hemoglobin: 12.9 g/dL — ABNORMAL LOW (ref 13.0–17.0)
Immature Granulocytes: 0 %
Lymphocytes Relative: 24 %
Lymphs Abs: 2 10*3/uL (ref 0.7–4.0)
MCH: 22.7 pg — ABNORMAL LOW (ref 26.0–34.0)
MCHC: 30.2 g/dL (ref 30.0–36.0)
MCV: 75 fL — ABNORMAL LOW (ref 80.0–100.0)
Monocytes Absolute: 0.6 10*3/uL (ref 0.1–1.0)
Monocytes Relative: 7 %
Neutro Abs: 5.6 10*3/uL (ref 1.7–7.7)
Neutrophils Relative %: 67 %
Platelets: 293 10*3/uL (ref 150–400)
RBC: 5.69 MIL/uL (ref 4.22–5.81)
RDW: 15.9 % — ABNORMAL HIGH (ref 11.5–15.5)
WBC: 8.4 10*3/uL (ref 4.0–10.5)
nRBC: 0 % (ref 0.0–0.2)

## 2022-12-29 LAB — GLUCOSE, CAPILLARY
Glucose-Capillary: 106 mg/dL — ABNORMAL HIGH (ref 70–99)
Glucose-Capillary: 114 mg/dL — ABNORMAL HIGH (ref 70–99)

## 2022-12-29 LAB — TYPE AND SCREEN
ABO/RH(D): B POS
Antibody Screen: NEGATIVE

## 2022-12-29 LAB — COMPREHENSIVE METABOLIC PANEL
ALT: 15 U/L (ref 0–44)
AST: 17 U/L (ref 15–41)
Albumin: 3 g/dL — ABNORMAL LOW (ref 3.5–5.0)
Alkaline Phosphatase: 69 U/L (ref 38–126)
Anion gap: 13 (ref 5–15)
BUN: 23 mg/dL — ABNORMAL HIGH (ref 6–20)
CO2: 26 mmol/L (ref 22–32)
Calcium: 9.5 mg/dL (ref 8.9–10.3)
Chloride: 101 mmol/L (ref 98–111)
Creatinine, Ser: 0.99 mg/dL (ref 0.61–1.24)
GFR, Estimated: >60 mL/min (ref >60)
Glucose, Bld: 105 mg/dL — ABNORMAL HIGH (ref 70–99)
Potassium: 3.5 mmol/L (ref 3.5–5.1)
Sodium: 140 mmol/L (ref 135–145)
Total Bilirubin: 0.3 mg/dL (ref <1.2)
Total Protein: 7.4 g/dL (ref 6.5–8.1)

## 2022-12-29 LAB — HIV ANTIBODY (ROUTINE TESTING W REFLEX): HIV Screen 4th Generation wRfx: NONREACTIVE

## 2022-12-29 LAB — PROCALCITONIN: Procalcitonin: <0.1 ng/mL

## 2022-12-29 MED ORDER — CARVEDILOL 12.5 MG PO TABS
12.5000 mg | ORAL_TABLET | Freq: Two times a day (BID) | ORAL | Status: DC
  Administered 2022-12-29 – 2022-12-30 (×3): 12.5 mg
  Filled 2022-12-29 (×2): qty 1

## 2022-12-29 MED ORDER — ISOSORBIDE DINITRATE 10 MG PO TABS
20.0000 mg | ORAL_TABLET | Freq: Three times a day (TID) | ORAL | Status: DC
  Filled 2022-12-29 (×2): qty 2

## 2022-12-29 MED ORDER — CARVEDILOL 12.5 MG PO TABS: 12.5000 mg | ORAL_TABLET | Freq: Two times a day (BID) | ORAL | Status: DC

## 2022-12-29 MED ORDER — METHOCARBAMOL 500 MG PO TABS
500.0000 mg | ORAL_TABLET | Freq: Three times a day (TID) | ORAL | Status: DC
  Administered 2022-12-29 – 2022-12-30 (×3): 500 mg
  Filled 2022-12-29 (×3): qty 1

## 2022-12-29 MED ORDER — AMLODIPINE BESYLATE 5 MG PO TABS: 10.0000 mg | ORAL_TABLET | Freq: Every day | ORAL | Status: DC

## 2022-12-29 MED ORDER — AMLODIPINE BESYLATE 5 MG PO TABS
10.0000 mg | ORAL_TABLET | Freq: Every day | ORAL | Status: DC
  Filled 2022-12-29: qty 2

## 2022-12-29 MED ORDER — ENOXAPARIN SODIUM 40 MG/0.4ML IJ SOSY: 40.0000 mg | PREFILLED_SYRINGE | INTRAMUSCULAR | Status: DC

## 2022-12-29 MED ORDER — SODIUM CHLORIDE 0.9 % IV SOLN
3.0000 g | Freq: Four times a day (QID) | INTRAVENOUS | Status: DC
  Administered 2022-12-29 – 2022-12-30 (×3): 3 g via INTRAVENOUS
  Filled 2022-12-29 (×3): qty 8

## 2022-12-29 MED ORDER — LOSARTAN POTASSIUM 50 MG PO TABS: 50.0000 mg | ORAL_TABLET | Freq: Every day | ORAL | Status: DC

## 2022-12-29 MED ORDER — CLONIDINE HCL 0.2 MG/24HR TD PTWK
0.2000 mg | MEDICATED_PATCH | TRANSDERMAL | Status: DC
Start: 2023-01-05 — End: 2022-12-30

## 2022-12-29 MED ORDER — AMLODIPINE BESYLATE 10 MG PO TABS
10.0000 mg | ORAL_TABLET | Freq: Every day | ORAL | Status: DC
  Administered 2022-12-29 – 2022-12-30 (×2): 10 mg
  Filled 2022-12-29: qty 1

## 2022-12-29 MED ORDER — SENNA 8.6 MG PO TABS: 8.6000 mg | ORAL_TABLET | Freq: Every evening | ORAL | Status: DC | PRN

## 2022-12-29 MED ORDER — SODIUM CHLORIDE 0.9 % IV SOLN
3.0000 g | Freq: Once | INTRAVENOUS | Status: AC
  Administered 2022-12-29: 3 g via INTRAVENOUS
  Filled 2022-12-29: qty 8

## 2022-12-29 MED ORDER — ISOSORBIDE DINITRATE 10 MG PO TABS
20.0000 mg | ORAL_TABLET | Freq: Three times a day (TID) | ORAL | Status: DC
Start: 2022-12-29 — End: 2022-12-30
  Administered 2022-12-29 – 2022-12-30 (×2): 20 mg
  Filled 2022-12-29 (×2): qty 2

## 2022-12-29 MED ORDER — LOSARTAN POTASSIUM 50 MG PO TABS
50.0000 mg | ORAL_TABLET | Freq: Every day | ORAL | Status: DC
  Administered 2022-12-29 – 2022-12-30 (×2): 50 mg
  Filled 2022-12-29: qty 1

## 2022-12-29 MED ORDER — ACETAMINOPHEN 650 MG RE SUPP: 650.0000 mg | Freq: Four times a day (QID) | RECTAL | Status: DC | PRN

## 2022-12-29 MED ORDER — ACETAMINOPHEN 325 MG PO TABS: 650.0000 mg | ORAL_TABLET | Freq: Four times a day (QID) | ORAL | Status: DC | PRN

## 2022-12-29 MED ORDER — CARVEDILOL 12.5 MG PO TABS
12.5000 mg | ORAL_TABLET | Freq: Two times a day (BID) | ORAL | Status: DC
  Filled 2022-12-29: qty 1

## 2022-12-29 MED ORDER — ALBUTEROL SULFATE (2.5 MG/3ML) 0.083% IN NEBU: 3.0000 mL | INHALATION_SOLUTION | RESPIRATORY_TRACT | Status: DC | PRN

## 2022-12-29 MED ORDER — SODIUM CHLORIDE 0.9 % IV SOLN: INTRAVENOUS | Status: AC

## 2022-12-29 MED ORDER — LOSARTAN POTASSIUM 50 MG PO TABS
50.0000 mg | ORAL_TABLET | Freq: Every day | ORAL | Status: DC
  Filled 2022-12-29: qty 1

## 2022-12-29 MED ORDER — OSMOLITE 1.2 CAL PO LIQD
1000.0000 mL | ORAL | Status: DC
  Administered 2022-12-29: 1000 mL
  Filled 2022-12-29 (×2): qty 1000

## 2022-12-29 NOTE — ED Notes (Signed)
ED TO INPATIENT HANDOFF REPORT  ED Nurse Name and Phone #: Renita Papa RN [phone]  S Name/Age/Gender Stephen Delgado 57 y.o. male Room/Bed: TRAAC/TRAAC  Code Status   Code Status: Prior  Home/SNF/Other Home  Is this baseline? Patient nonverbal at baseline  Triage Complete: Triage complete  Chief Complaint Laceration of lip [S01.511A]  Triage Note Pt BIBPTAR for uncontrollable bleeding from the mouth and trach site. Suction stopped working on the ambulance and patient was being trach collared with EMS via 02 tank. Pt wears oxygen intermittently. Pt has been chewing on bottom lip and having episodes of bleeding in his mouth. G tube has dark brown fluids, trach has bright red blood when suctioned, and mouth has bright red blood pooling. Pt has not had his BP meds today.  Hx stroke and paraplegia from stroke, contracture, HTN    Allergies No Known Allergies  Level of Care/Admitting Diagnosis ED Disposition     ED Disposition  Admit   Condition  --   Comment  Hospital Area: [facility] [[id number]]  Level of Care: Telemetry Medical [104]  May place patient in observation at [facility] or [facility] if equivalent level of care is available:: No  Covid Evaluation: Asymptomatic - no recent exposure (last 10 days) testing not required  Diagnosis: Laceration of lip [454098]  Admitting Physician: Clydie Braun [[id number]]  Attending Physician: Clydie Braun [[id number]]          B Medical/Surgery History Past Medical History:  Diagnosis Date   Acute bacterial sinusitis    Acute kidney injury on CKD 3b/acute azotemia with mild hypernatremia 01/23/2021   Acute respiratory failure (HCC)    a. 12/2020 in setting of IVH/strokes-->s/p trach.   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 01/2021 MRI: numerous sm acute infarcts in bilat cerebral hemispheres, Lt  caudate hemorrhage, IVH, scattered sulcal SAH, mild communicating hydrocephalus.   Cerebral atrophy (HCC) 07/09/2021   Cerebrovascular accident (CVA) (HCC) 03/04/2021   CKD (chronic kidney disease), stage III (HCC)    Diastolic dysfunction    a. 04/2015 Echo: EF 50-55%, mod LVH, no rwma, GrI DD, triv AI, + ASD; b. 12/2020 Echo: EF 50-55%, no rwma, grIDD, nl RV fxn, triv MR, mild AI, Asc Ao 37mm.   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 12/2020 Aucte L basal ganglia intraparenchymal hemorrhage w/ IVH in the setting of hypertensive emergency.   Nontraumatic intracerebral hemorrhage (HCC)    Persistent vegetative state (HCC) 07/09/2021   Pressure injury of skin 04/06/2021   Protein-calorie malnutrition, severe 03/26/2021   Protein-calorie malnutrition/dysphagia due to recent stroke/constipation 03/04/2021   Type II diabetes mellitus [facility])    Past Surgical History:  Procedure Laterality Date   ESOPHAGOGASTRODUODENOSCOPY N/A 01/16/2021   Procedure: ESOPHAGOGASTRODUODENOSCOPY (EGD);  Surgeon: Violeta Gelinas, MD;  Location: [facility] ENDOSCOPY;  Service: Endoscopy;  Laterality: N/A;   IR REPLC GASTRO/COLONIC TUBE PERCUT W/FLUORO  06/09/2021   NO PAST SURGERIES     PEG PLACEMENT N/A 01/16/2021   Procedure: PERCUTANEOUS ENDOSCOPIC GASTROSTOMY (PEG) PLACEMENT;  Surgeon: Violeta Gelinas, MD;  [facility];  Service: Endoscopy;  Laterality: N/A;     A IV  Location/Drains/Wounds Patient Lines/Drains/Airways Status     Active Line/Drains/Airways     Name Placement date Placement time Site Days   Peripheral IV 12/29/22 20 G 1" Posterior;Right Wrist 12/29/22  1008  Wrist  less than 1   Gastrostomy/Enterostomy 16 Fr. LUQ 06/09/21  0846  LUQ  568   Tracheostomy Shiley Flexible 6 mm Uncuffed 12/29/22  1009  6 mm   less than 1   Pressure Injury 06/09/21 Sacrum Stage 2 -  Partial thickness loss of dermis presenting as a shallow open injury with a red, pink wound bed without slough. healing white area 06/09/21  1938  -- 568            Intake/Output Last 24 hours  Intake/Output Summary (Last 24 hours) at 12/29/2022 1246 Last data filed at 12/29/2022 1139 Gross per 24 hour  Intake 250 ml  Output --  Net 250 ml    Labs/Imaging Results for orders placed or performed during the hospital encounter of 12/29/22 (from the past 48 hour(s))  Type and screen [facility]     Status: None   Collection Time: 12/29/22  9:54 AM  Result Value Ref Range   ABO/RH(D) B POS    Antibody Screen NEG    Sample Expiration      01/01/2023,2359 Performed at [facility], [address] [place], [place] [address]   CBC with Differential     Status: Abnormal   Collection Time: 12/29/22  9:59 AM  Result Value Ref Range   WBC 8.4 4.0 - 10.5 K/uL   RBC 5.69 4.22 - 5.81 MIL/uL   Hemoglobin 12.9 (L) 13.0 - 17.0 g/dL   HCT 60.4 54.0 - 98.1 %   MCV 75.0 (L) 80.0 - 100.0 fL   MCH 22.7 (L) 26.0 - 34.0 pg   MCHC 30.2 30.0 - 36.0 g/dL   RDW 19.1 (H) 47.8 - 29.5 %   Platelets 293 150 - 400 K/uL   nRBC 0.0 0.0 - 0.2 %   Neutrophils Relative % 67 %   Neutro Abs 5.6 1.7 - 7.7 K/uL   Lymphocytes Relative 24 %   Lymphs Abs 2.0 0.7 - 4.0 K/uL   Monocytes Relative 7 %   Monocytes Absolute 0.6 0.1 - 1.0 K/uL   Eosinophils Relative 2 %   Eosinophils Absolute 0.2 0.0 - 0.5 K/uL   Basophils Relative 0 %   Basophils Absolute 0.0 0.0 - 0.1 K/uL   Immature Granulocytes 0 %   Abs Immature Granulocytes 0.02 0.00 - 0.07 K/uL    Comment: Performed at [facility], [address], [place], [place] [address]  Comprehensive metabolic panel     Status: Abnormal   Collection Time: 12/29/22  9:59 AM  Result Value Ref Range   Sodium 140 135 - 145 mmol/L   Potassium 3.5 3.5 - 5.1 mmol/L   Chloride 101 98 -  111 mmol/L   CO2 26 22 - 32 mmol/L   Glucose, Bld 105 (H) 70 - 99 mg/dL    Comment: Glucose reference range applies only to samples taken after fasting for at least 8 hours.   BUN 23 (H) 6 - 20 mg/dL   Creatinine, Ser 8.65 0.61 - 1.24 mg/dL   Calcium 9.5 8.9 - 78.4 mg/dL   Total Protein 7.4 6.5 - 8.1 g/dL   Albumin 3.0 (L) 3.5 - 5.0 g/dL   AST 17 15 - 41 U/L   ALT 15 0 - 44 U/L  Alkaline Phosphatase 69 38 - 126 U/L   Total Bilirubin 0.3 <1.2 mg/dL   GFR, Estimated >16 >10 mL/min    Comment: (NOTE) Calculated using the CKD-EPI Creatinine Equation (2021)    Anion gap 13 5 - 15    Comment: Performed at [facility], [address] [place], [place] [address]   DG Chest Port 1 View  Result Date: 12/29/2022 CLINICAL DATA:  Dyspnea. Episode of coughing and dislodged tracheostomy. Upper arms contracted. EXAM: PORTABLE CHEST 1 VIEW COMPARISON:  Chest radiographs 02/03/2022 and 07/09/2021 FINDINGS: The patient's right hand obscures portions of the cardiac silhouette. Within this limitation, cardiac silhouette and mediastinal contours appear unchanged. Tracheostomy tube overlies the trachea, however the superior aspect of the tube is lower than on the most recent 02/03/2022 frontal chest radiograph, now overlying the T3 vertebral body compared to the C7-T1 level. The current positioning is somewhat similar to 07/09/2021 radiograph. No definite focal lung airspace opacity. No pleural effusion or pneumothorax is seen. No acute skeletal abnormality. IMPRESSION: 1. Tracheostomy tube overlies the trachea. The superior aspect of the tracheostomy tube appears lower than on 02/03/2022 frontal radiograph but is somewhat similar to 07/09/2021 and the difference is likely due to patient positioning and projection. The tracheostomy tube does grossly overlie the tracheal column. Recommend clinical correlation. 2. No definite focal lung airspace opacity. Electronically Signed   By: Neita Garnet M.D.   On:  12/29/2022 11:41    Pending Labs Unresulted Labs (From admission, onward)    None       Vitals/Pain Today's Vitals   12/29/22 1045 12/29/22 1115 12/29/22 1130 12/29/22 1215  BP: (!) 187/124 (!) 145/100 126/84 112/86  Pulse: (!) 108 (!) 105 (!) 103 100  Resp: 20 18 16 20   Temp:      TempSrc:      SpO2: 100% 100% 100% 100%    Isolation Precautions No active isolations  Medications Medications  carvedilol (COREG) tablet 12.5 mg (12.5 mg Per Tube Given 12/29/22 1132)  amLODipine (NORVASC) tablet 10 mg (10 mg Per Tube Given 12/29/22 1132)  losartan (COZAAR) tablet 50 mg (50 mg Per Tube Given 12/29/22 1132)  Ampicillin-Sulbactam (UNASYN) 3 g in sodium chloride 0.9 % 100 mL IVPB (0 g Intravenous Stopped 12/29/22 1047)    Mobility Paraplegic      Focused Assessments Cardiac Assessment Handoff:  Cardiac Rhythm: Normal sinus rhythm Lab Results  Component Value Date   CKTOTAL 176 01/29/2021   Lab Results  Component Value Date   DDIMER 2.13 (H) 01/08/2021   Does the Patient currently have chest pain? No   , Pulmonary Assessment Handoff:  Lung sounds: Bilateral Breath Sounds: Clear, Diminished L Breath Sounds: Clear R Breath Sounds: Clear O2 Device: Tracheostomy Collar O2 Flow Rate (L/min): 6 L/min    R Recommendations: See Admitting Provider Note  Report given to:   Additional Notes:   Chronic foley

## 2022-12-29 NOTE — ED Notes (Signed)
Reapplied Quickclot with clamp for continuous pressure to control bleeding.

## 2022-12-29 NOTE — ED Notes (Signed)
Patient immediately suctioned via deep suction for patency of tracheostomy. Suctioned in the mouth for the pooling blood, obtaining 50 cc of blood out. Bleeding coming from open bite in the bottom lip.

## 2022-12-29 NOTE — Progress Notes (Signed)
TRH night cross cover note:   I was contacted by our [facility] requesting clarification on whether or not to continue Unasyn, which was initiated for concern regarding aspiration pneumonia.  I subsequently continued Unasyn for aspiration pneumonia.     Newton Pigg, DO Hospitalist

## 2022-12-29 NOTE — ED Triage Notes (Addendum)
Pt BIBPTAR for uncontrollable bleeding from the mouth and trach site. Suction stopped working on the ambulance and patient was being trach collared with EMS via 02 tank. Pt wears oxygen intermittently. Pt has been chewing on bottom lip and having episodes of bleeding in his mouth. G tube has dark brown fluids, trach has bright red blood when suctioned, and mouth has bright red blood pooling. Pt has not had his BP meds today.  Hx stroke and paraplegia from stroke, contracture, HTN

## 2022-12-29 NOTE — ED Notes (Signed)
Bleeding controlled at this time, Quickclot and pressure taken off.

## 2022-12-29 NOTE — ED Notes (Signed)
Direct pressure applied to control bleeding from mouth.

## 2022-12-29 NOTE — ED Notes (Signed)
Quickclot applied to right lower inner lip.

## 2022-12-29 NOTE — H&P (Signed)
History and Physical    Patient: Stephen Delgado JYN:[id number] DOB: [birth year] DOA: 12/29/2022 DOS: the patient was seen and examined on 12/29/2022 PCP: Stephen Salter, MD  Patient coming from: Home via EMS  Chief Complaint:  Chief Complaint  Patient presents with   bleeding from trach   HPI: Stephen Delgado is a 57 y.o. male with medical history significant of hypertension, prior CVA in 2022, trach dependent, PEG dependent, diabetes mellitus type 2, and CKD who presents with complaints of uncontrolled bleeding from the right lip.  History is obtained from the patient's nurse at home who is present at bedside as he is nonverbal.  The patient has been biting his right lower lip for several weeks. The affected area appeared severely damaged, described as resembling "hamburger meat" when seen by a doctor. This morning, the patient experienced significant bleeding from the lip, so much so that it began to come out of his tracheostomy. Multiple attempts at suctioning and applying pressure to control the bleeding, but was unsuccessful. The patient has difficulty keeping a mouthguard in place due to his mouth being open most of the time.  Symptoms have been present since the patient suffered in 2022. The patient tends to clench his jaw hard upon waking.  The patient has a PEG tube that has not been changed in a year.  He is currently on continuous feeds with OsmoLite at 1.2 at 80 ml/hr The patient occasionally experiences vomiting.  His son noted that his PEG tube was noted to have some dark brown drainage out of tube two weeks ago.  In the emergency department patient was noted to be afebrile with heart rates 10 3-113, and all other vital signs stable.  Labs significant for hemoglobin 12.9, BUN 23, creatinine 0.99. Chest x-ray noted tracheostomy tube overlying the trachea similar to prior positioning with no definitive lung opacity appreciated.  Bleeding from lip was treated with quick clot and was  felt not to benefit from cauterization.  Patient was given Unasyn 3 g IV x 1 dose.  Review of Systems: unable to review all systems due to the inability of the patient to answer questions. Past Medical History:  Diagnosis Date   Acute bacterial sinusitis    Acute kidney injury on CKD 3b/acute azotemia with mild hypernatremia 01/23/2021   Acute respiratory failure (HCC)    a. 12/2020 in setting of IVH/strokes-->s/p trach.   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 01/2021 MRI: numerous sm acute infarcts in bilat cerebral hemispheres, Lt caudate hemorrhage, IVH, scattered sulcal SAH, mild communicating hydrocephalus.   Cerebral atrophy (HCC) 07/09/2021   Cerebrovascular accident (CVA) (HCC) 03/04/2021   CKD (chronic kidney disease), stage III (HCC)    Diastolic dysfunction    a. 04/2015 Echo: EF 50-55%, mod LVH, no rwma, GrI DD, triv AI, + ASD; b. 12/2020 Echo: EF 50-55%, no rwma, grIDD, nl RV fxn, triv MR, mild AI, Asc Ao 37mm.   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 12/2020 Aucte L basal ganglia intraparenchymal hemorrhage w/ IVH in the setting of hypertensive emergency.   Nontraumatic intracerebral hemorrhage (HCC)    Persistent vegetative state (HCC) 07/09/2021  Pressure injury of skin 04/06/2021   Protein-calorie malnutrition, severe 03/26/2021   Protein-calorie malnutrition/dysphagia due to recent stroke/constipation 03/04/2021   Type II diabetes mellitus [facility])    Past Surgical History:  Procedure Laterality Date   ESOPHAGOGASTRODUODENOSCOPY N/A 01/16/2021   Procedure: ESOPHAGOGASTRODUODENOSCOPY (EGD);  Surgeon: Stephen Gelinas, MD;  Location: [facility] ENDOSCOPY;  Service: Endoscopy;  Laterality: N/A;   IR  REPLC GASTRO/COLONIC TUBE PERCUT W/FLUORO  06/09/2021   NO PAST SURGERIES     PEG PLACEMENT N/A 01/16/2021   Procedure: PERCUTANEOUS ENDOSCOPIC GASTROSTOMY (PEG) PLACEMENT;  Surgeon: Stephen Gelinas, MD;  Location: [facility] ENDOSCOPY;  Service: Endoscopy;  Laterality: N/A;   Social History:  reports that he has never smoked. He has never used smokeless tobacco. He reports current alcohol use. He reports that he does not use drugs.  No Known Allergies  Family History  Problem Relation Age of Onset   Stroke Paternal Uncle     Prior to Admission medications   Medication Sig Start Date End Date Taking? Authorizing Provider  acetaminophen (TYLENOL) 325 MG tablet Place 2 tablets (650 mg total) into feeding tube every 6 (six) hours. Patient taking differently: Place 650 mg into feeding tube every 6 (six) hours as needed for mild pain (pain score 1-3). 05/21/21  Yes Elgergawy, Leana Roe, MD  albuterol (PROVENTIL) (2.5 MG/3ML) 0.083% nebulizer solution Take 3 mLs (2.5 mg total) by nebulization every 4 (four) hours as needed for wheezing or shortness of breath. 09/03/22  Yes   amLODipine (NORVASC) 10 MG tablet Take 1 tablet (10 mg total) by mouth daily. 12/15/22  Yes   carvedilol (COREG) 12.5 MG tablet Take 1 tablet (12.5 mg total) by mouth 2 (two) times daily with food. 12/15/22  Yes   cloNIDine (CATAPRES - DOSED IN MG/24 HR) 0.2 mg/24hr patch Place 1 patch (0.2 mg total) onto the skin once a week. 04/13/22  Yes Stephen Salter, MD  isosorbide dinitrate (ISORDIL) 20 MG tablet Take 1 tablet (20 mg total) by mouth 3 (three) times daily. 12/15/22  Yes   losartan (COZAAR) 50 MG tablet Take 1 tablet (50 mg total) by mouth daily. 12/15/22  Yes   metFORMIN (GLUCOPHAGE) 500 MG tablet Take 1 tablet (500 mg total) by mouth 2 (two) times daily with a meal. 04/13/22  Yes   methocarbamol (ROBAXIN) 500 MG tablet Place 1 tablet (500 mg total) into feeding tube 3 (three) times as needed daily. 09/15/22  Yes   amLODipine  (NORVASC) 10 MG tablet Take 1 tablet (10 mg total) by mouth daily. 12/09/22     antiseptic oral rinse (BIOTENE) LIQD 15 mLs by Mouth Rinse route as needed for dry mouth. 03/30/22   Stephen Salter, MD  Blood Glucose Monitoring Suppl (BLOOD GLUCOSE MONITOR SYSTEM) w/Device KIT Use up to 4 (four) times daily as directed 06/17/21   Stephen Salter, MD  Blood Pressure Monitoring DEVI 1 application. by Does not apply route as needed. 06/03/21   Stephen Salter, MD  carvedilol (COREG) 12.5 MG tablet Take 1 tablet (12.5 mg total) by mouth 2 (two) times daily with a meal. 12/15/22     carvedilol (COREG) 12.5 MG tablet Take 1 tablet (12.5 mg total) by mouth 2 (two) times daily. 12/09/22     chlorhexidine (PERIDEX) 0.12 % solution Rinse and spit 15 mLs by mouth 2 (two) times daily.  Do not eat or drink for 30 minutes afterward. 03/19/22   Stephen Salter, MD  cloNIDine (CATAPRES - DOSED IN MG/24  HR) 0.2 mg/24hr patch Place 1 patch (0.2 mg total) onto the skin once a week. 11/25/22     collagenase (SANTYL) 250 UNIT/GM ointment Apply 1 application. topically 2 (two) times daily. To  affected area Patient taking differently: Apply 1 application. topically 2 (two) times daily as needed (to affected area for wound care). 05/30/21   Stephen Salter, MD  doxycycline (VIBRA-TABS) 100 MG tablet Crush 1 tablet and insert via tube every 12 hours followed by 100 ml  flush 04/05/22     doxycycline (VIBRAMYCIN) 50 MG/5ML SYRP Place 10 mLs (100 mg total) in feeding tube 2 (two) times daily for 10 days 07/28/22   Stephen Salter, MD  glucose blood test strip Use to check blood sugar up to  twice daily 08/16/22   Annita Brod, MD  hydrALAZINE (APRESOLINE) 100 MG tablet Take 1/2 tablet (50 mg total) by mouth 3 (three) times daily as needed. Crush tablets and place in G tube then flush with 10cc of saline 03/03/22   Stephen Salter, MD  hydrALAZINE (APRESOLINE) 100 MG tablet Place 0.5 tablets (50 mg total) in G tube 3 (three) times  daily as needed. Crush tablet and place in G tube, then flush with 10CC of saline. 07/28/22     isosorbide dinitrate (ISORDIL) 20 MG tablet Take 1 tablet (20 mg total) by mouth 3 (three) times daily. 12/09/22     Lactulose 20 GM/30ML SOLN Take 15 mLs (10 g total) by mouth daily as needed 06/16/22     Lancets (ONETOUCH DELICA PLUS LANCET33G) MISC Use to test blood sugar up to 4 (four) times daily as directed 06/17/21   Stephen Salter, MD  Lancets MISC Use as directed once daily 07/28/22   Annita Brod, MD  losartan (COZAAR) 50 MG tablet Take 1 tablet (50 mg total) by mouth daily. 12/09/22     metFORMIN (GLUCOPHAGE) 500 MG tablet Take 1 tablet (500 mg total) by mouth 2 (two) times daily with a meal. 12/15/22     metFORMIN (GLUCOPHAGE) 500 MG tablet Take 1 tablet (500 mg total) by mouth 2 (two) times daily with a meal. 12/09/22     methocarbamol (ROBAXIN) 500 MG tablet Take 1 tablet (500 mg total) by mouth 3 (three) times daily. 12/15/22     methocarbamol (ROBAXIN) 500 MG tablet Take 1 tablet (500 mg total) by mouth 3 (three) times daily. 12/09/22     metoCLOPramide (REGLAN) 10 MG/10ML SOLN Take 5 mLs (5 mg total) by mouth 3 (three) times daily as needed for nausea or vomiting. 12/15/22     metoCLOPramide (REGLAN) 10 MG/10ML SOLN Take 10 mLs (10 mg total) by mouth 4 (four) times daily. 12/15/22     metoCLOPramide (REGLAN) 5 MG/5ML solution Take 10 mLs (10 mg total) by mouth every 6 (six) hours. 12/15/22     metoCLOPramide (REGLAN) 5 MG/5ML solution Take 5 mLs (5 mg total) by mouth 3 (three) times daily before meals. 12/09/22     Mouthwashes (MOUTH RINSE) LIQD solution 15 mLs by Mouth Rinse route 2 times daily at 12 noon and 4 pm. 05/21/21   Elgergawy, Leana Roe, MD  Nutritional Supplements (FEEDING SUPPLEMENT, OSMOLITE 1.5 CAL,) LIQD Place 1,000 mLs into feeding tube continuous. 05/22/21   Elgergawy, Leana Roe, MD  Nutritional Supplements (FEEDING SUPPLEMENT, PROSOURCE TF,) liquid Place 45 mLs into feeding tube  2 (two) times daily. 05/21/21   Elgergawy, Leana Roe, MD  ondansetron (ZOFRAN) 4 MG tablet Place 1 tablet (4 mg  total) into feeding tube every 8 (eight) hours as needed for nausea or vomiting (crush and flush after w/ 100cc fluid). 03/30/22   Stephen Salter, MD  polyethylene glycol (MIRALAX / GLYCOLAX) 17 g packet Place 17 g into feeding tube daily as needed for mild constipation. 06/18/21   Stephen Salter, MD  senna (SENOKOT) 8.6 MG TABS tablet Take 1 tablet (8.6 mg total) by mouth at bedtime as needed for constipation. 12/15/22     Water For Irrigation, Sterile (FREE WATER) SOLN Place 200 mLs into feeding tube every 4 (four) hours. 05/21/21   Elgergawy, Leana Roe, MD    Physical Exam: Vitals:   12/29/22 1020 12/29/22 1045 12/29/22 1115 12/29/22 1130  BP: (!) 200/115 (!) 187/124 (!) 145/100 126/84  Pulse: (!) 107 (!) 108 (!) 105 (!) 103  Resp: 17 20 18 16   Temp:      TempSrc:      SpO2: 100% 100% 100% 100%     Constitutional: Middle-age male who appears to be resting Eyes: PERRL, lids and conjunctivae normal ENMT: Mucous membranes are moist.  Lacerations noted of the right lip currently not actively bleeding. Neck: Tracheostomy in place with trach collar present Respiratory: Rales present bilaterally in patient's lung field. Cardiovascular: Regular rate and rhythm, no murmurs / rubs / gallops. No extremity edema. 2+ pedal pulses. No carotid bruits.  Abdomen: G-tube in place on the left side of abdomen without signs of erythema appreciated.  Bowel sounds present in all 4 quadrants. Genitourinary: Foley catheter in place draining clear yellow urine Musculoskeletal: no clubbing / cyanosis.  Contractures noted of the bilateral upper extremities Skin: Poor skin turgor.  No induration Neurologic: Limited movement able to wiggle lower extremities just a little bit able to use upper extremities. Psychiatric: Unable to assess as patient is nonverbal.  Data Reviewed:  EKG revealed sinus  tachycardia at 107 bpm. Reviewed labs, imaging, and pertinent records as documented  Assessment and Plan:  Laceration of lip  Patient has been biting his right lower lip for several weeks, resulting in significant bleeding. The lip appeared severely damaged upon initial examination. Bleeding has been severe enough to require suctioning and has come out of the patient's tracheostomy. Multiple attempts to stop the bleeding have been made, including applying pressure and using quick clot. -Admit to a telemetry bed - Monitor patient for further bleeding orders in place to use Quikclot gauze as needed - Implement measures to prevent lip biting (e.g., gauze placement in a facemask wrapped in gauze to place in patient's mouth   Suspected aspiration  Patient was reported to have blood coming out from his tracheostomy tube.  O2 saturations currently maintained on 2 L of oxygen via trach collar.Chest x-ray was otherwise noted to be clear.   Had been given Unasyn IV. -Aspiration precautions with elevation head of bed -Check procalcitonin on previous labs and again in a.m.  -At this time further antibiotics do not appear to be warranted.  History of CVA with residual deficit Tracheostomy dependent S/p PEG tube Patient with prior history of CVA back in 2022 ultimately left him with need of complete care.  Patient with contractures noted of bilateral upper extremities, unable to to communicate, status post tracheostomy, and PEG tube for feeding. -Tube feed protocol utilized -Osmolite 1.2 at 80 mL/h -Consult registered dietitian  Microcytic anemia Hemoglobin 12.9 g/dL with MCV 75 and MCH 16.1 which appears similar to priors.  Patient bleeding appears to be controlled from lip biting at  this time. -Recheck CBC tomorrow morning  Essential hypertension Blood pressures currently 112/87. -Continue amlodipine, Coreg, clonidine patch, losartan, isosorbide mononitrate, and hydralazine   DVT prophylaxis:  Lovenox Advance Care Planning:   Code Status: Full Code   Consults: None  Family Communication: Son updated at as well as patient's nurse present at bedside  Severity of Illness: The appropriate patient status for this patient is OBSERVATION. Observation status is judged to be reasonable and necessary in order to provide the required intensity of service to ensure the patient's safety. The patient's presenting symptoms, physical exam findings, and initial radiographic and laboratory data in the context of their medical condition is felt to place them at decreased risk for further clinical deterioration. Furthermore, it is anticipated that the patient will be medically stable for discharge from the hospital within 2 midnights of admission.   Author: Clydie Braun, MD 12/29/2022 12:07 PM  For on call review [email or web address].

## 2022-12-29 NOTE — ED Provider Notes (Signed)
[place] EMERGENCY DEPARTMENT AT [facility] Provider Note   CSN: [id number] Arrival date & time: 12/29/22  4098     History  Chief Complaint  Patient presents with   bleeding from trach    OSBY MICHAELSON is a 57 y.o. male.  Patient brought in by EMS.  Patient brought from home.  Patient family contacted them for bleeding from his right lower lip area.  Patient status post significant CVA 2 years ago.  Patient has significant contractions.  Patient nonverbal since that time.  There has been difficulties with him chewing on the lip has been bleeding from there before.  But this time they could not get it stopped it was squirting.  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  And normally is on 2 L trach oxygen.  Upon arrival significant bleeding.  EMS was concerned that he aspirated.  Because a lot of blood was coming out of his trach.  Bleeding definitely from the right lower lip.  Pressure applied by me.  Problem list a cerebral accident in February 2023.  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  Patient has G-tube placed.  Patient has dysphagia due to the recent stroke.  Also problem list list nontraumatic intracerebral hemorrhage.  Home nurse states that mental status is all baseline.  His neuroexam is baseline.  The big concern was the bleeding from the lip.  Patient is not on blood thinners.  Patient last seen by [place] in January 2024 for tracheostomy tube change.       Home Medications Prior to Admission medications   Medication Sig Start Date End Date Taking? Authorizing Provider  acetaminophen (TYLENOL) 325 MG tablet Place 2 tablets (650 mg total) into feeding tube every 6 (six) hours. Patient taking differently: Place 650 mg into feeding tube every 6 (six) hours as needed for mild pain (pain score 1-3). 05/21/21  Yes Elgergawy, Leana Roe, MD  albuterol (PROVENTIL) (2.5 MG/3ML) 0.083% nebulizer  solution Take 3 mLs (2.5 mg total) by nebulization every 4 (four) hours as needed for wheezing or shortness of breath. 09/03/22  Yes   amLODipine (NORVASC) 10 MG tablet Take 1 tablet (10 mg total) by mouth daily. 12/15/22  Yes   carvedilol (COREG) 12.5 MG tablet Take 1 tablet (12.5 mg total) by mouth 2 (two) times daily with food. 12/15/22  Yes   cloNIDine (CATAPRES - DOSED IN MG/24 HR) 0.2 mg/24hr patch Place 1 patch (0.2 mg total) onto the skin once a week. 04/13/22  Yes Haydee Salter, MD  isosorbide dinitrate (ISORDIL) 20 MG tablet Take 1 tablet (20 mg total) by mouth 3 (three) times daily. 12/15/22  Yes   losartan (COZAAR) 50 MG tablet Take 1 tablet (50 mg total) by mouth daily. 12/15/22  Yes   metFORMIN (GLUCOPHAGE) 500 MG tablet Take 1 tablet (500 mg total) by mouth 2 (two) times daily with a meal. 04/13/22  Yes   methocarbamol (ROBAXIN) 500 MG tablet Place 1 tablet (500 mg total) into feeding tube 3 (three) times as needed daily. 09/15/22  Yes   amLODipine (NORVASC) 10 MG tablet Take 1 tablet (10 mg total) by mouth daily. 12/09/22     antiseptic oral rinse (BIOTENE) LIQD 15 mLs by Mouth Rinse route as needed for dry mouth. 03/30/22   Haydee Salter, MD  Blood Glucose Monitoring Suppl (BLOOD GLUCOSE MONITOR SYSTEM) w/Device KIT Use up to 4 (four) times daily  as directed 06/17/21   Haydee Salter, MD  Blood Pressure Monitoring DEVI 1 application. by Does not apply route as needed. 06/03/21   Haydee Salter, MD  carvedilol (COREG) 12.5 MG tablet Take 1 tablet (12.5 mg total) by mouth 2 (two) times daily with a meal. 12/15/22     carvedilol (COREG) 12.5 MG tablet Take 1 tablet (12.5 mg total) by mouth 2 (two) times daily. 12/09/22     chlorhexidine (PERIDEX) 0.12 % solution Rinse and spit 15 mLs by mouth 2 (two) times daily.  Do not eat or drink for 30 minutes afterward. 03/19/22   Haydee Salter, MD  cloNIDine (CATAPRES - DOSED IN MG/24 HR) 0.2 mg/24hr patch Place 1 patch (0.2 mg total) onto the  skin once a week. 11/25/22     collagenase (SANTYL) 250 UNIT/GM ointment Apply 1 application. topically 2 (two) times daily. To  affected area Patient taking differently: Apply 1 application. topically 2 (two) times daily as needed (to affected area for wound care). 05/30/21   Haydee Salter, MD  doxycycline (VIBRA-TABS) 100 MG tablet Crush 1 tablet and insert via tube every 12 hours followed by 100 ml  flush 04/05/22     doxycycline (VIBRAMYCIN) 50 MG/5ML SYRP Place 10 mLs (100 mg total) in feeding tube 2 (two) times daily for 10 days 07/28/22   Haydee Salter, MD  glucose blood test strip Use to check blood sugar up to  twice daily 08/16/22   Annita Brod, MD  hydrALAZINE (APRESOLINE) 100 MG tablet Take 1/2 tablet (50 mg total) by mouth 3 (three) times daily as needed. Crush tablets and place in G tube then flush with 10cc of saline 03/03/22   Haydee Salter, MD  hydrALAZINE (APRESOLINE) 100 MG tablet Place 0.5 tablets (50 mg total) in G tube 3 (three) times daily as needed. Crush tablet and place in G tube, then flush with 10CC of saline. 07/28/22     isosorbide dinitrate (ISORDIL) 20 MG tablet Take 1 tablet (20 mg total) by mouth 3 (three) times daily. 12/09/22     Lactulose 20 GM/30ML SOLN Take 15 mLs (10 g total) by mouth daily as needed 06/16/22     Lancets (ONETOUCH DELICA PLUS LANCET33G) MISC Use to test blood sugar up to 4 (four) times daily as directed 06/17/21   Haydee Salter, MD  Lancets MISC Use as directed once daily 07/28/22   Annita Brod, MD  losartan (COZAAR) 50 MG tablet Take 1 tablet (50 mg total) by mouth daily. 12/09/22     metFORMIN (GLUCOPHAGE) 500 MG tablet Take 1 tablet (500 mg total) by mouth 2 (two) times daily with a meal. 12/15/22     metFORMIN (GLUCOPHAGE) 500 MG tablet Take 1 tablet (500 mg total) by mouth 2 (two) times daily with a meal. 12/09/22     methocarbamol (ROBAXIN) 500 MG tablet Take 1 tablet (500 mg total) by mouth 3 (three) times daily. 12/15/22      methocarbamol (ROBAXIN) 500 MG tablet Take 1 tablet (500 mg total) by mouth 3 (three) times daily. 12/09/22     metoCLOPramide (REGLAN) 10 MG/10ML SOLN Take 5 mLs (5 mg total) by mouth 3 (three) times daily as needed for nausea or vomiting. 12/15/22     metoCLOPramide (REGLAN) 10 MG/10ML SOLN Take 10 mLs (10 mg total) by mouth 4 (four) times daily. 12/15/22     metoCLOPramide (REGLAN) 5 MG/5ML solution Take 10 mLs (10 mg total) by mouth every  6 (six) hours. 12/15/22     metoCLOPramide (REGLAN) 5 MG/5ML solution Take 5 mLs (5 mg total) by mouth 3 (three) times daily before meals. 12/09/22     Mouthwashes (MOUTH RINSE) LIQD solution 15 mLs by Mouth Rinse route 2 times daily at 12 noon and 4 pm. 05/21/21   Elgergawy, Leana Roe, MD  Nutritional Supplements (FEEDING SUPPLEMENT, OSMOLITE 1.5 CAL,) LIQD Place 1,000 mLs into feeding tube continuous. 05/22/21   Elgergawy, Leana Roe, MD  Nutritional Supplements (FEEDING SUPPLEMENT, PROSOURCE TF,) liquid Place 45 mLs into feeding tube 2 (two) times daily. 05/21/21   Elgergawy, Leana Roe, MD  ondansetron (ZOFRAN) 4 MG tablet Place 1 tablet (4 mg total) into feeding tube every 8 (eight) hours as needed for nausea or vomiting (crush and flush after w/ 100cc fluid). 03/30/22   Haydee Salter, MD  polyethylene glycol (MIRALAX / GLYCOLAX) 17 g packet Place 17 g into feeding tube daily as needed for mild constipation. 06/18/21   Haydee Salter, MD  senna (SENOKOT) 8.6 MG TABS tablet Take 1 tablet (8.6 mg total) by mouth at bedtime as needed for constipation. 12/15/22     Water For Irrigation, Sterile (FREE WATER) SOLN Place 200 mLs into feeding tube every 4 (four) hours. 05/21/21   Elgergawy, Leana Roe, MD      Allergies    Patient has no known allergies.    Review of Systems   Review of Systems  Unable to perform ROS: Patient nonverbal  Gastrointestinal:  Negative for vomiting.  Skin:  Negative for color change and rash.  Neurological:  Positive for weakness.  All  other systems reviewed and are negative.   Physical Exam Updated Vital Signs BP 126/84   Pulse (!) 103   Temp (!) 96.8 F (36 C) (Temporal)   Resp 16   SpO2 100%  Physical Exam Vitals and nursing note reviewed.  Constitutional:      General: He is not in acute distress.    Appearance: He is well-developed. He is ill-appearing.  HENT:     Head: Normocephalic and atraumatic.     Mouth/Throat:     Comments: Significant bleeding from the right lower lip.  Where patient has been chewing on it.  There is scar in that area.  There is also scar on the left lower lip with no active bleeding there.  Was able to control the bleeding with quick clot dressing and nose pincher over the area.  We also did a lot of suctioning of clots from the oropharynx. Eyes:     Conjunctiva/sclera: Conjunctivae normal.  Neck:     Comments: Tracheostomy tube in place.  Suctioned by respiratory therapy.  Blood did come out of that. Cardiovascular:     Rate and Rhythm: Normal rate and regular rhythm.     Heart sounds: No murmur heard. Pulmonary:     Effort: Pulmonary effort is normal. No respiratory distress.     Breath sounds: Rhonchi present. No wheezing or rales.  Abdominal:     Palpations: Abdomen is soft.     Tenderness: There is no abdominal tenderness.     Comments: G-tube left side of abdomen.  Genitourinary:    Comments: Foley catheter in place. Musculoskeletal:        General: No swelling.     Cervical back: Neck supple.  Skin:    General: Skin is warm and dry.     Capillary Refill: Capillary refill takes less than 2 seconds.  Neurological:  Comments: Significant contractions of upper extremities.  Nonverbal.  According to the nurse provider from home this is all baseline.  Patient is awake.  Psychiatric:        Mood and Affect: Mood normal.     ED Results / Procedures / Treatments   Labs (all labs ordered are listed, but only abnormal results are displayed) Labs Reviewed  CBC WITH  DIFFERENTIAL/PLATELET - Abnormal; Notable for the following components:      Result Value   Hemoglobin 12.9 (*)    MCV 75.0 (*)    MCH 22.7 (*)    RDW 15.9 (*)    All other components within normal limits  COMPREHENSIVE METABOLIC PANEL - Abnormal; Notable for the following components:   Glucose, Bld 105 (*)    BUN 23 (*)    Albumin 3.0 (*)    All other components within normal limits  TYPE AND SCREEN    EKG EKG Interpretation Date/Time:  Wednesday December 29 2022 09:57:32 EST Ventricular Rate:  107 PR Interval:  154 QRS Duration:  88 QT Interval:  342 QTC Calculation: 457 R Axis:   39  Text Interpretation: Sinus tachycardia Confirmed by Vanetta Mulders [phone]) on 12/29/2022 11:00:16 AM  Radiology No results found.  Procedures Procedures    Medications Ordered in ED Medications  carvedilol (COREG) tablet 12.5 mg (12.5 mg Per Tube Given 12/29/22 1132)  amLODipine (NORVASC) tablet 10 mg (10 mg Per Tube Given 12/29/22 1132)  losartan (COZAAR) tablet 50 mg (50 mg Per Tube Given 12/29/22 1132)  Ampicillin-Sulbactam (UNASYN) 3 g in sodium chloride 0.9 % 100 mL IVPB (0 g Intravenous Stopped 12/29/22 1047)    ED Course/ Medical Decision Making/ A&P                                 Medical Decision Making Amount and/or Complexity of Data Reviewed Labs: ordered. Radiology: ordered.  Risk Prescription drug management. Decision regarding hospitalization.   Significant bleeding from right side lower lip.  Venous squirting.  Held pressure.  Which slowed it down.  Then put quick clot dressing and there and held pressure again that stopped it.  Then it started to ooze again.  So we did a fresh quick clot dressing and applied nose pincher clips to the lower lip.  Bleeding now controlled.  Big concern was there was a lot of blood coming out of his trachea all suctioned by respiratory therapy could have aspirated.  Started on prophylactic antibiotics in case of  aspiration.  CBC white count 8.4 hemoglobin 12.9 which is basic baseline for him platelets 293 complete metabolic panel liver function test electrolytes are normal renal functions normal.  Patient does have a Foley catheter in place.  Patient was typed and screened.  X-ray reviewed by me.  Very difficult to get a read on it nothing super acute because his arms are contracted across his chest and you cannot remove them.  EKG with some sinus tachycardia.  Feel patient needs admission for observation to make sure no aspiration pneumonia and respiratory difficulty developing.    Final Clinical Impression(s) / ED Diagnoses Final diagnoses:  Aspiration into airway, initial encounter    Rx / DC Orders ED Discharge Orders     None         Vanetta Mulders, MD 12/29/22 1142

## 2022-12-30 DIAGNOSIS — S01511A Laceration without foreign body of lip, initial encounter: Secondary | ICD-10-CM | POA: Diagnosis not present

## 2022-12-30 DIAGNOSIS — Z515 Encounter for palliative care: Secondary | ICD-10-CM

## 2022-12-30 LAB — GLUCOSE, CAPILLARY
Glucose-Capillary: 142 mg/dL — ABNORMAL HIGH (ref 70–99)
Glucose-Capillary: 152 mg/dL — ABNORMAL HIGH (ref 70–99)
Glucose-Capillary: 153 mg/dL — ABNORMAL HIGH (ref 70–99)

## 2022-12-30 LAB — PROCALCITONIN: Procalcitonin: <0.1 ng/mL

## 2022-12-30 MED ORDER — METFORMIN HCL 500 MG PO TABS: 500.0000 mg | ORAL_TABLET | Freq: Two times a day (BID) | ORAL | Status: DC

## 2022-12-30 MED ORDER — ISOSORBIDE DINITRATE 20 MG PO TABS: 20.0000 mg | ORAL_TABLET | Freq: Three times a day (TID) | ORAL | Status: DC

## 2022-12-30 MED ORDER — CARVEDILOL 12.5 MG PO TABS: 12.5000 mg | ORAL_TABLET | Freq: Two times a day (BID) | ORAL | Status: DC

## 2022-12-30 MED ORDER — PROSOURCE TF20 ENFIT COMPATIBL EN LIQD
60.0000 mL | Freq: Every day | ENTERAL | Status: DC
  Administered 2022-12-30: 60 mL
  Filled 2022-12-30: qty 60

## 2022-12-30 MED ORDER — SENNA 8.6 MG PO TABS: 8.6000 mg | ORAL_TABLET | Freq: Every evening | ORAL | Status: AC | PRN

## 2022-12-30 MED ORDER — AMLODIPINE BESYLATE 10 MG PO TABS: 10.0000 mg | ORAL_TABLET | Freq: Every day | ORAL | Status: DC

## 2022-12-30 MED ORDER — FREE WATER
200.0000 mL | Status: DC
  Administered 2022-12-30: 200 mL

## 2022-12-30 MED ORDER — METHOCARBAMOL 500 MG PO TABS: 500.0000 mg | ORAL_TABLET | Freq: Three times a day (TID) | ORAL | Status: DC

## 2022-12-30 MED ORDER — LOSARTAN POTASSIUM 50 MG PO TABS: 50.0000 mg | ORAL_TABLET | Freq: Every day | ORAL | Status: DC

## 2022-12-30 MED ORDER — METOCLOPRAMIDE HCL 5 MG/5ML PO SOLN: 10.0000 mg | Freq: Four times a day (QID) | ORAL | Status: DC

## 2022-12-30 MED ORDER — HYDRALAZINE HCL 25 MG PO TABS: 25.0000 mg | ORAL_TABLET | Freq: Three times a day (TID) | ORAL | Status: AC

## 2022-12-30 MED ORDER — OSMOLITE 1.5 CAL PO LIQD
1000.0000 mL | ORAL | Status: DC
  Administered 2022-12-30: 1000 mL
  Filled 2022-12-30: qty 1000

## 2022-12-30 NOTE — Progress Notes (Signed)
Initial Nutrition Assessment  DOCUMENTATION CODES:   Not applicable  INTERVENTION:  Initiate tube feeding via PEG: Osmolite 1.5  at 65 ml/h (1560 ml per day) Prosource TF20 60 ml daily FWF 200 ml every 4 hrs Provides 2420 kcal, 118 gm protein, 1194 ml free water daily    NUTRITION DIAGNOSIS:   Increased nutrient needs related to chronic illness as evidenced by estimated needs.    GOAL:   Patient will meet greater than or equal to 90% of their needs    MONITOR:   TF tolerance  REASON FOR ASSESSMENT:   Consult Enteral/tube feeding initiation and management (trickle feeds only)  ASSESSMENT:57 y.o. M, admitted from home where he has a nurse that cares for him, with laceration to the lip. PMH; HTN CVA(2022), trach and PEG dependent,DM2, CKD. RD consulted for trickle feeds. MD was reached out to for clarification and he clarified that he wanted normal feedings and not trickle feeds.   Pt non verbal. All information obtained through EMR and team. Review of EMR revealed; pt has been biting lip for the last several weeks that in was so damaged a Dr. Reported it as looking like hamburger meat. Pt experienced such significant bleeding it came out of trach. Pt PEG has not been changed in year and family reports dark brown drainage ~ week ago. Current home feeding is Osmolite 1.2 at 62ml/hr. Provides 2304 kcal, 106.5 g pro, 1574 ml free water Pt has had ~30% weight decline since May 2023. Unable to determine time frame of weight decline. Suspect Pt would benefit from a more nutritional dense formula. Will change to Osmolite 1.5   Admit weight: 76.4 kg Current weight: 76.4 kg  Weight history: 12/30/22 76.4 kg  06/10/21 109.3 kg  05/19/21 115.2 kg      Average Meal Intake: NPO  Nutritionally Relevant Medications: Scheduled Meds:  amLODipine  10 mg Per Tube Daily   carvedilol  12.5 mg Per Tube BID   [START ON 01/05/2023] cloNIDine  0.2 mg Transdermal Weekly    Continuous  Infusions:  ampicillin-sulbactam (UNASYN) IV 3 g (12/30/22 0459)   feeding supplement (OSMOLITE 1.2 CAL) 1,000 mL (12/29/22 1746)    Labs Reviewed    NUTRITION - FOCUSED PHYSICAL EXAM:  Deferred  Diet Order:   Diet Order     None       EDUCATION NEEDS:   Not appropriate for education at this time  Skin:  Skin Assessment: Skin Integrity Issues: Skin Integrity Issues:: Other (Comment) Other: Open area to left buttocks  Last BM:  11/26  Height:   Ht Readings from Last 1 Encounters:  01/23/21 6' (1.829 m)    Weight:   Wt Readings from Last 1 Encounters:  12/30/22 76.4 kg    Ideal Body Weight:     BMI:  Body mass index is 22.84 kg/m.  Estimated Nutritional Needs:   Kcal:  0938-1829 kcal/d  Protein:  100-115 g/d  Fluid:  106ml/kcal    Jamelle Haring RDN, LDN Clinical Dietitian  RDN pager # available on Amion

## 2022-12-30 NOTE — Progress Notes (Signed)
   Palliative Medicine Inpatient Follow Up Note   The PMT has acknowledged the consultation for Stephen Delgado.  At this time the plan is for discharge.  Have sent a secure chat to Dr. Hanley Ben and [name], MSW to arrange OP Palliative care follow up given our short staffing at this time.  No Charge. ______________________________________________________________________________________ Lamarr Lulas [place] Palliative Medicine Team Team Cell Phone: [phone] Please utilize secure chat with additional questions, if there is no response within 30 minutes please call the above phone number  Palliative Medicine Team providers are available by phone from 7am to 7pm daily and can be reached through the team cell phone.  Should this patient require assistance outside of these hours, please call the patient's attending physician.

## 2022-12-30 NOTE — Discharge Summary (Signed)
Physician Discharge Summary  Stephen Delgado UJW:[id number] DOB: Mar 30, 1965 DOA: 12/29/2022  PCP: Haydee Salter, MD  Admit date: 12/29/2022 Discharge date: 12/30/2022  Admitted From: Home Disposition: Home  Recommendations for Outpatient Follow-up:  Follow up with PCP in 1 week  Outpatient evaluation and follow-up with ENT if symptoms persist or recur Recommend outpatient evaluation and follow-up with palliative care Follow up in ED if symptoms worsen or new appear   Home Health: No Equipment/Devices: None  Discharge Condition: Guarded to poor CODE STATUS: Full Diet recommendation: Heart healthy/tube feeding diet as per dietary recommendations  Brief/Interim Summary:  57 y.o. male with medical history significant of hypertension, prior CVA in 2022, trach dependent, PEG dependent, diabetes mellitus type 2, and CKD presented with uncontrolled bleeding from the right lip and bleeding from trach.  On presentation, hemoglobin was stable.  Chest x-ray showed no definitive lung opacity.  Bleeding from lip was treated with quick clot and was felt not to benefit from cauterization.  Subsequently, bleeding has stopped.  He remains hemodynamically stable although prognosis is very poor.  He will be discharged home with outpatient follow-up with PCP.  Discharge Diagnoses:   Lip bleeding -Due to biting his right lower lip for several weeks, resulting in significant bleeding. -Bleeding from lip was treated with quick clot and was felt not to benefit from cauterization.  Subsequently, bleeding has stopped.  He remains hemodynamically stable although prognosis is very poor.  He will be discharged home with outpatient follow-up with PCP. -Might need ENT evaluation as an outpatient if symptoms continue to recur -Patient might benefit from measures to prevent lip biting  Suspected aspiration -Patient was reported to have blood coming out from his tracheostomy tube.  Chest x-ray was negative for  any infiltrates.  Patient was given Unasyn on admission.  Procalcitonin less than 0.1.  Does not need any antibiotics currently on discharge.  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  Patient has contractures of bilateral upper extremities, unable to communicate, status post tracheostomy and is dependent on PEG tube for feeding. -Resume tube feeding on discharge.  Outpatient follow-up with palliative care for goals of care discussion.  Microcytic anemia -Hemoglobin stable on presentation.  Outpatient follow-up  Essential hypertension -Continue home regimen.  Outpatient follow-up with PCP. Discharge Instructions  Discharge Instructions     No wound care   Complete by: As directed       Allergies as of 12/30/2022   No Known Allergies      Medication List     TAKE these medications    albuterol (2.5 MG/3ML) 0.083% nebulizer solution Commonly known as: PROVENTIL Take 3 mLs (2.5 mg total) by nebulization every 4 (four) hours as needed for wheezing or shortness of breath.   amLODipine 10 MG tablet Commonly known as: NORVASC Place 1 tablet (10 mg total) into feeding tube daily. What changed: how to take this   carvedilol 12.5 MG tablet Commonly known as: COREG Place 1 tablet (12.5 mg total) into feeding tube 2 (two) times daily. What changed: how to take this   cloNIDine 0.2 mg/24hr patch Commonly known as: CATAPRES - Dosed in mg/24 hr Place 1 patch (0.2 mg total) onto the skin once a week.   feeding supplement (PROSource TF) liquid Place 45 mLs into feeding tube 2 (two) times daily.   hydrALAZINE 25 MG tablet Commonly known as: APRESOLINE Place 1 tablet (25 mg total) into feeding tube  3 (three) times daily. Patient can take up to 3 times daily What changed: how to take this   isosorbide dinitrate 20 MG tablet Commonly known as: ISORDIL Place 1 tablet (20 mg total) into feeding tube 3  (three) times daily. What changed: how to take this   losartan 50 MG tablet Commonly known as: COZAAR Place 1 tablet (50 mg total) into feeding tube daily. What changed: how to take this   metFORMIN 500 MG tablet Commonly known as: GLUCOPHAGE Place 1 tablet (500 mg total) into feeding tube 2 (two) times daily with a meal. What changed: how to take this   methocarbamol 500 MG tablet Commonly known as: ROBAXIN Place 1 tablet (500 mg total) into feeding tube 3 (three) times daily. What changed: how to take this   metoCLOPramide 5 MG/5ML solution Commonly known as: REGLAN Place 10 mLs (10 mg total) into feeding tube every 6 (six) hours. What changed: how to take this   OneTouch Delica Plus Lancet33G Misc Use as directed once daily   OneTouch Verio test strip Generic drug: glucose blood Use to check blood sugar up to  twice daily   senna 8.6 MG Tabs tablet Commonly known as: SENOKOT Place 1 tablet (8.6 mg total) into feeding tube at bedtime as needed. What changed: how to take this        Follow-up Information     Haydee Salter, MD. Schedule an appointment as soon as possible for a visit in 1 week(s).   Specialty: Family Medicine Contact information: [address] [place] [address] [phone]                No Known Allergies  Consultations: None   Procedures/Studies: DG Abd Portable 1V  Result Date: 12/29/2022 CLINICAL DATA:  [id number].  Gastrojejunal tube present. EXAM: PORTABLE ABDOMEN - 1 VIEW COMPARISON:  Study of 11/22/2021 FINDINGS: Portable supine single AP abdomen at 6:35 p.m.: Peg tube in place with retention balloon superimposing in the mid stomach similar to the previous exam but without contrast verification on the current study. The bowel pattern is nonobstructive. There is no supine evidence of free air. The patient's arms folded across the upper abdomen. No pathologic calcification is seen. Scoliosis and degenerative change  lumbar spine. IMPRESSION: 1. PEG tube in place with retention balloon superimposing in the mid stomach similar to the previous exam but without contrast verification on the current study. 2. Nonobstructive bowel gas pattern. Electronically Signed   By: Almira Bar M.D.   On: 12/29/2022 22:30   DG Chest Port 1 View  Result Date: 12/29/2022 CLINICAL DATA:  Dyspnea. Episode of coughing and dislodged tracheostomy. Upper arms contracted. EXAM: PORTABLE CHEST 1 VIEW COMPARISON:  Chest radiographs 02/03/2022 and 07/09/2021 FINDINGS: The patient's right hand obscures portions of the cardiac silhouette. Within this limitation, cardiac silhouette and mediastinal contours appear unchanged. Tracheostomy tube overlies the trachea, however the superior aspect of the tube is lower than on the most recent 02/03/2022 frontal chest radiograph, now overlying the T3 vertebral body compared to the C7-T1 level. The current positioning is somewhat similar to 07/09/2021 radiograph. No definite focal lung airspace opacity. No pleural effusion or pneumothorax is seen. No acute skeletal abnormality. IMPRESSION: 1. Tracheostomy tube overlies the trachea. The superior aspect of the tracheostomy tube appears lower than on 02/03/2022 frontal radiograph but is somewhat similar to 07/09/2021 and the difference is likely due to patient positioning and projection. The tracheostomy tube does grossly overlie the tracheal column.  Recommend clinical correlation. 2. No definite focal lung airspace opacity. Electronically Signed   By: Neita Garnet M.D.   On: 12/29/2022 11:41      Subjective: Patient seen and examined at bedside.  Patient does not follow commands.  Son present at bedside states that bleeding from the lip has stopped.  Feels okay that his father be discharged home today.  No fever, vomiting reported.  Discharge Exam: Vitals:   12/30/22 0448 12/30/22 0808  BP: (!) 156/96 (!) 168/99  Pulse: 94   Resp: 18   Temp: 98.6 F  (37 C) 97.7 F (36.5 C)  SpO2: 100%     General: Awake, does not follow any commands.  Chronically ill and deconditioned.  Janina Mayo present and is currently on 6 L oxygen via trach  cardiovascular: rate controlled, S1/S2 + Respiratory: bilateral decreased breath sounds at bases with scattered crackles Abdominal: Soft, NT, ND, bowel sounds +, PEG tube present Extremities: no edema, no cyanosis.  Contractures noted of bilateral upper extremities.    The results of significant diagnostics from this hospitalization (including imaging, microbiology, ancillary and laboratory) are listed below for reference.     Microbiology: No results found for this or any previous visit (from the past 240 hour(s)).   Labs: BNP (last 3 results) No results for input(s): "BNP" in the last 8760 hours. Basic Metabolic Panel: Recent Labs  Lab 12/29/22 0959  NA 140  K 3.5  CL 101  CO2 26  GLUCOSE 105*  BUN 23*  CREATININE 0.99  CALCIUM 9.5   Liver Function Tests: Recent Labs  Lab 12/29/22 0959  AST 17  ALT 15  ALKPHOS 69  BILITOT 0.3  PROT 7.4  ALBUMIN 3.0*   No results for input(s): "LIPASE", "AMYLASE" in the last 168 hours. No results for input(s): "AMMONIA" in the last 168 hours. CBC: Recent Labs  Lab 12/29/22 0959  WBC 8.4  NEUTROABS 5.6  HGB 12.9*  HCT 42.7  MCV 75.0*  PLT 293   Cardiac Enzymes: No results for input(s): "CKTOTAL", "CKMB", "CKMBINDEX", "TROPONINI" in the last 168 hours. BNP: Invalid input(s): "POCBNP" CBG: Recent Labs  Lab 12/29/22 1607 12/29/22 1920 12/30/22 0044 12/30/22 0449 12/30/22 0809  GLUCAP 106* 114* 142* 153* 152*   D-Dimer No results for input(s): "DDIMER" in the last 72 hours. Hgb A1c No results for input(s): "HGBA1C" in the last 72 hours. Lipid Profile No results for input(s): "CHOL", "HDL", "LDLCALC", "TRIG", "CHOLHDL", "LDLDIRECT" in the last 72 hours. Thyroid function studies No results for input(s): "TSH", "T4TOTAL", "T3FREE",  "THYROIDAB" in the last 72 hours.  Invalid input(s): "FREET3" Anemia work up No results for input(s): "VITAMINB12", "FOLATE", "FERRITIN", "TIBC", "IRON", "RETICCTPCT" in the last 72 hours. Urinalysis    Component Value Date/Time   COLORURINE YELLOW 05/08/2021 1929   APPEARANCEUR HAZY (A) 05/08/2021 1929   APPEARANCEUR Clear 02/26/2016 1648   LABSPEC 1.009 05/08/2021 1929   PHURINE 5.0 05/08/2021 1929   GLUCOSEU NEGATIVE 05/08/2021 1929   HGBUR SMALL (A) 05/08/2021 1929   BILIRUBINUR negative 04/29/2022 1542   BILIRUBINUR Negative 02/26/2016 1648   KETONESUR NEGATIVE 05/08/2021 1929   PROTEINUR Positive (A) 04/29/2022 1542   PROTEINUR 30 (A) 05/08/2021 1929   UROBILINOGEN 0.2 04/29/2022 1542   NITRITE positve 04/29/2022 1542   NITRITE NEGATIVE 05/08/2021 1929   LEUKOCYTESUR Negative 04/29/2022 1542   LEUKOCYTESUR TRACE (A) 05/08/2021 1929   Sepsis Labs Recent Labs  Lab 12/29/22 0959  WBC 8.4   Microbiology No results found for  this or any previous visit (from the past 240 hour(s)).   Time coordinating discharge: 35 minutes  SIGNED:   Glade Lloyd, MD  [facility] 12/30/2022, 9:48 AM

## 2022-12-30 NOTE — TOC Transition Note (Signed)
Transition of Care [facility]) - CM/SW Discharge Note   Patient Details  Name: Stephen Delgado MRN: [id number] Date of Birth: 1965-06-17  [facility] CM/SW Contact:  Tom-Johnson, Hershal Coria, RN Phone Number: 12/30/2022, 10:31 AM   Clinical Narrative:     Patient is scheduled for discharge today.  Outpatient Palliative Care referral sent to [facility] and Lorin Picket will contact patient and family.  Patient currently active with [facility] with RN services.  Patient receives Trach and O2 supplies from [employer].  Hospital f/u and discharge instructions on AVS. [facility] scheduled to transport at discharge.  No further TOC needs noted.             Final next level of care: Home w Home Health Services (Patient active with [facility] RN from [facility]) Barriers to Discharge: Barriers Resolved   Patient Goals and CMS Choice CMS Medicare.gov Compare Post Acute Care list provided to:: Patient Choice offered to / list presented to : Patient, Adult Children (Son, [name])  Discharge Placement                  Patient to be transferred to facility by: [facility]      Discharge Plan and Services Additional resources added to the After Visit Summary for                  DME Arranged: N/A DME Agency: NA       HH Arranged:  (Active with [facility]) HH Agency: NA        Social Determinants of Health (SDOH) Interventions SDOH Screenings   Food Insecurity: No Food Insecurity (12/29/2022)  Housing: High Risk (12/29/2022)  Transportation Needs: Patient Unable To Answer (12/29/2022)  Utilities: Patient Unable To Answer (12/29/2022)  Tobacco Use: Low Risk  (12/29/2022)     Readmission Risk Interventions    02/20/2021    9:31 AM  Readmission Risk Prevention Plan  Transportation Screening Complete  Medication Review (RN Care Manager) Complete  PCP or Specialist appointment within 3-5 days of discharge Not Complete  PCP/Specialist Appt Not Complete comments Patient will need LTC  placement at SNF facility.  HRI or Home Care Consult Complete  SW Recovery Care/Counseling Consult Complete  Palliative Care Screening Complete  Skilled Nursing Facility Complete

## 2023-01-03 ENCOUNTER — Other Ambulatory Visit (HOSPITAL_COMMUNITY): Payer: Self-pay

## 2023-01-05 ENCOUNTER — Other Ambulatory Visit (HOSPITAL_COMMUNITY): Payer: Self-pay

## 2023-01-10 ENCOUNTER — Other Ambulatory Visit (HOSPITAL_COMMUNITY): Payer: Self-pay

## 2023-01-10 MED ORDER — CHLORHEXIDINE GLUCONATE 0.12 % MT SOLN
15.0000 mL | Freq: Two times a day (BID) | OROMUCOSAL | 11 refills | Status: AC
  Filled 2023-01-10: qty 946, 32d supply, fill #0
  Filled 2023-03-16: qty 946, 32d supply, fill #1
  Filled 2023-05-19: qty 946, 32d supply, fill #2

## 2023-01-12 ENCOUNTER — Emergency Department (HOSPITAL_COMMUNITY): Payer: Commercial Managed Care - HMO

## 2023-01-12 ENCOUNTER — Emergency Department (HOSPITAL_COMMUNITY)
Admission: EM | Admit: 2023-01-12 | Discharge: 2023-01-12 | Disposition: A | Discharge status: Home / Self Care | Payer: Commercial Managed Care - HMO | Attending: Emergency Medicine | Admitting: Emergency Medicine

## 2023-01-12 ENCOUNTER — Other Ambulatory Visit: Payer: Self-pay

## 2023-01-12 ENCOUNTER — Encounter (HOSPITAL_COMMUNITY): Payer: Self-pay

## 2023-01-12 DIAGNOSIS — Y732 Prosthetic and other implants, materials and accessory gastroenterology and urology devices associated with adverse incidents: Secondary | ICD-10-CM | POA: Insufficient documentation

## 2023-01-12 DIAGNOSIS — T85598A Other mechanical complication of other gastrointestinal prosthetic devices, implants and grafts, initial encounter: Secondary | ICD-10-CM | POA: Insufficient documentation

## 2023-01-12 MED ORDER — DIATRIZOATE MEGLUMINE & SODIUM 66-10 % PO SOLN
ORAL | Status: AC
  Filled 2023-01-12: qty 30

## 2023-01-12 MED ORDER — DIATRIZOATE MEGLUMINE & SODIUM 66-10 % PO SOLN
25.0000 mL | Freq: Once | ORAL | Status: AC
  Administered 2023-01-12: 25 mL
  Filled 2023-01-12: qty 30

## 2023-01-12 NOTE — ED Triage Notes (Addendum)
Pt coming in from home with home health nurse. Pt g tube is leaking from the tube. The tube has been in place for a year. It was placed in October of 2023 Ems vitals  Rr 18 Bp 154/93 Spo2 97% 2l  Hr 93 bpm

## 2023-01-12 NOTE — ED Provider Notes (Signed)
[place] EMERGENCY DEPARTMENT AT [facility] Provider Note   CSN: [id number] Arrival date & time: 01/12/23  7846     History  No chief complaint on file.   Stephen Delgado is a 57 y.o. male.  HPI Patient presents with his nurse and his son.  He arrives via EMS.  The patient has multiple prior strokes, is bedbound, with tracheostomy, PEG tube.  This PEG tube was placed almost 1 year ago, and over the past days it is been leaking from the lesion on the tubing itself.  Patient has contractions, and family hypothesizes that patient is rubbing his arm against the tubing.  No other injuries, no other complaints, no family or nursing staff notation of hemodynamic instability at home.  EMS also reports no hemodynamic instability en route.  Level 5 [place] secondary to nonverbal status.    Home Medications Prior to Admission medications   Medication Sig Start Date End Date Taking? Authorizing Provider  albuterol (PROVENTIL) (2.5 MG/3ML) 0.083% nebulizer solution Take 3 mLs (2.5 mg total) by nebulization every 4 (four) hours as needed for wheezing or shortness of breath. 09/03/22     amLODipine (NORVASC) 10 MG tablet Place 1 tablet (10 mg total) into feeding tube daily. 12/30/22   Glade Lloyd, MD  carvedilol (COREG) 12.5 MG tablet Place 1 tablet (12.5 mg total) into feeding tube 2 (two) times daily. 12/30/22   Glade Lloyd, MD  chlorhexidine (PERIDEX) 0.12 % solution Swish 15 mLs in mouth for 30 seconds, then spit out. Use 2 (two) times daily. 01/10/23     cloNIDine (CATAPRES - DOSED IN MG/24 HR) 0.2 mg/24hr patch Place 1 patch (0.2 mg total) onto the skin once a week. 11/25/22     glucose blood test strip Use to check blood sugar up to  twice daily 08/16/22   Annita Brod, MD  hydrALAZINE (APRESOLINE) 25 MG tablet Place 1 tablet (25 mg total) into feeding tube 3 (three) times daily. Patient can take up to 3 times daily 12/30/22   Glade Lloyd, MD  isosorbide dinitrate (ISORDIL) 20  MG tablet Place 1 tablet (20 mg total) into feeding tube 3 (three) times daily. 12/30/22   Glade Lloyd, MD  Lancets MISC Use as directed once daily 07/28/22   Annita Brod, MD  losartan (COZAAR) 50 MG tablet Place 1 tablet (50 mg total) into feeding tube daily. 12/30/22   Glade Lloyd, MD  metFORMIN (GLUCOPHAGE) 500 MG tablet Place 1 tablet (500 mg total) into feeding tube 2 (two) times daily with a meal. 12/30/22   Glade Lloyd, MD  methocarbamol (ROBAXIN) 500 MG tablet Place 1 tablet (500 mg total) into feeding tube 3 (three) times daily. 12/30/22   Glade Lloyd, MD  metoCLOPramide (REGLAN) 5 MG/5ML solution Place 10 mLs (10 mg total) into feeding tube every 6 (six) hours. 12/30/22   Glade Lloyd, MD  Nutritional Supplements (FEEDING SUPPLEMENT, PROSOURCE TF,) liquid Place 45 mLs into feeding tube 2 (two) times daily. 05/21/21   Elgergawy, Leana Roe, MD  senna (SENOKOT) 8.6 MG TABS tablet Place 1 tablet (8.6 mg total) into feeding tube at bedtime as needed. 12/30/22   Glade Lloyd, MD      Allergies    Patient has no known allergies.    Review of Systems   Review of Systems  Physical Exam Updated Vital Signs BP (!) 175/82   Pulse 91   Temp 98.1 F (36.7 C) (Axillary)   Resp 18   Ht 6' (1.829  m)   Wt 76.4 kg   SpO2 100%   BMI 22.84 kg/m  Physical Exam Vitals and nursing note reviewed.  Constitutional:      Appearance: He is well-developed.     Comments: Chronically ill-appearing adult male tracheostomy in place, multiple contraction, essentially noninteractive  HENT:     Head: Normocephalic and atraumatic.  Eyes:     Conjunctiva/sclera: Conjunctivae normal.  Cardiovascular:     Rate and Rhythm: Normal rate and regular rhythm.  Pulmonary:     Effort: Pulmonary effort is normal. No respiratory distress.     Breath sounds: No stridor.  Abdominal:     General: There is no distension.     Tenderness: There is no guarding.    Skin:    General: Skin is warm and  dry.  Neurological:     Comments: Substantial impairment consistent with prior stroke  Psychiatric:        Cognition and Memory: Cognition is impaired. Memory is impaired.     ED Results / Procedures / Treatments   Labs (all labs ordered are listed, but only abnormal results are displayed) Labs Reviewed - No data to display  EKG None  Radiology DG ABDOMEN PEG TUBE LOCATION  Result Date: 01/12/2023 CLINICAL DATA:  Peg placement EXAM: ABDOMEN - 1 VIEW COMPARISON:  12/29/2022 FINDINGS: Percutaneous gastrostomy tube in place entering the mid body. Injection of 25 cc of Gastrografin shows a normal filling pattern within the stomach. No evidence of extravasation or obstruction. IMPRESSION: Percutaneous gastrostomy tube in place. No evidence of extravasation or obstruction. Electronically Signed   By: Paulina Fusi M.D.   On: 01/12/2023 11:38    Procedures FEEDING TUBE REPLACEMENT  Date/Time: 01/12/2023 10:30 AM  Performed by: Gerhard Munch, MD Authorized by: Gerhard Munch, MD  Consent: The procedure was performed in an emergent situation. Verbal consent obtained. Written consent not obtained. Risks and benefits: risks, benefits and alternatives were discussed Consent given by: guardian Required items: required blood products, implants, devices, and special equipment available Patient identity confirmed: arm band Time out: Immediately prior to procedure a "time out" was called to verify the correct patient, procedure, equipment, support staff and site/side marked as required. Preparation: Patient was prepped and draped in the usual sterile fashion. Indications: tube malfunction and tube cracked Local anesthesia used: no  Anesthesia: Local anesthesia used: no  Sedation: Patient sedated: no  Tube type: gastrostomy Patient position: recumbent Procedure type: replacement Tube size: 16 Fr Endoscope used: no Bulb inflation volume: 4 (ml) Bulb inflation fluid: normal  saline Placement/position confirmation: x-ray Patient tolerance: patient tolerated the procedure well with no immediate complications       Medications Ordered in ED Medications  diatrizoate meglumine-sodium (GASTROGRAFIN) 66-10 % solution 25 mL (25 mLs Per Tube Given 01/12/23 1035)    ED Course/ Medical Decision Making/ A&P                                 Medical Decision Making Adult male, PEG tube dependent, trach dependent presents with malfunctioning PEG tube.  After discussion with family, nursing patient had revision of PEG tube here, performed by myself, performed without complication.  X-ray reviewed PEG and appropriate location, patient discharged in stable condition.  Amount and/or Complexity of Data Reviewed Independent Historian: caregiver External Data Reviewed: notes. Radiology: ordered and independent interpretation performed. Decision-making details documented in ED Course.  Risk Prescription drug management. Decision regarding hospitalization.  Diagnosis or treatment significantly limited by social determinants of health.  Final Clinical Impression(s) / ED Diagnoses Final diagnoses:  Feeding tube dysfunction, initial encounter     Gerhard Munch, MD 01/12/23 1202

## 2023-01-12 NOTE — Discharge Instructions (Addendum)
Your feeding tube has been replaced, and should be replaced on a more regular basis, discussed this with your physician.  Return here for concerning changes in your condition.

## 2023-01-13 ENCOUNTER — Other Ambulatory Visit: Payer: Self-pay

## 2023-01-13 ENCOUNTER — Other Ambulatory Visit (HOSPITAL_COMMUNITY): Payer: Self-pay

## 2023-01-13 MED ORDER — CARVEDILOL 12.5 MG PO TABS
12.5000 mg | ORAL_TABLET | Freq: Two times a day (BID) | ORAL | 0 refills | Status: DC
  Filled 2023-01-13: qty 60, 30d supply, fill #0

## 2023-01-17 ENCOUNTER — Other Ambulatory Visit (HOSPITAL_COMMUNITY): Payer: Self-pay

## 2023-01-21 ENCOUNTER — Other Ambulatory Visit (HOSPITAL_COMMUNITY): Payer: Self-pay

## 2023-01-21 ENCOUNTER — Other Ambulatory Visit: Payer: Self-pay

## 2023-01-21 MED ORDER — CLONIDINE 0.2 MG/24HR TD PTWK
0.2000 mg | MEDICATED_PATCH | TRANSDERMAL | 3 refills | Status: AC
  Filled 2023-01-21: qty 12, 84d supply, fill #0
  Filled 2023-03-07 – 2023-04-29 (×2): qty 12, 84d supply, fill #1

## 2023-01-24 ENCOUNTER — Other Ambulatory Visit (HOSPITAL_COMMUNITY): Payer: Self-pay

## 2023-01-27 ENCOUNTER — Other Ambulatory Visit (HOSPITAL_COMMUNITY): Payer: Self-pay

## 2023-02-14 NOTE — Progress Notes (Signed)
 [facility]  Referred by: No ref. provider found  Chief Complaint: Wound  PMH Includes: - Anemia - DM2 - CKD  Initial History 06/21/22:  Here for evaluation of sacral ulceration. Ulceration has been present for about 6 months. The ulceration has slowly worsened progressively since February. They have used medihoney and silver alginate without improvement. He has a regular hospital bed. He requires 24/7 care.  He is accompanied today by his son, Fairy and nurse, Q. They provide all the history. Patient is unable to speak.   They report he is gaining weight. He is slowly improving weight. No recent weight loss. They have to keep him at an angle to allow him to breathe.   His quadriplegia comes from a stroke in 2022. He lives at home with two sons and wife. They have nurses daily at the home to help with care.   They report BG of < 140 every day in AM  Relevant work up/results: CBC with Hgb 12.8 1/24 CMP with Albumin  3.1 1/24   Interval history/progress: Initial visit - see above  08/16/22 Wound is significantly healthier.   09/20/22 Wound is significantly smaller. They are noting great progress. No concerns.   10/18/22  Doing well with local care  12/13/22 Wounds are pinpoint. Nearly healed. No evidence of infection. Happy with progress.   02/14/23 Wound is superficial, pinpoint. Doing great No concerns.   Vitals:   02/14/23 1016  BP: 142/82  Pulse: 88  Temp: 97.3 F (36.3 C)   Exam: Pt is AA in NAD. Trach in place. Non-verbal. Contractures of upper extremities. Frail.  Wound(s) exam: sacrum:  One ulceration that is superficial and still present. No depth. Granular No acute infectious signs.   Wound 06/21/22 Sacrum (Active)  Date First Assessed/Time First Assessed: 06/21/22 0932   Pre-Existing Wound: Yes  Location: Sacrum    Assessments 06/21/2022  9:36 AM 02/14/2023 10:19 AM  Wound Image      Site Assessment Clean;Tunneling Red   Peri-Wound Assessment Clean Clean  Drainage Amount Moderate Scant  Drainage Description Serosanguineous Serosanguineous  Wound Odor None None  Wound Length (cm) 2.5 cm 0.1 cm  Wound Width (cm) 1.5 cm 0.1 cm  Wound Surface Area (cm^2) 3.75 cm^2 0.01 cm^2  Wound Depth (cm) 1 cm 0.1 cm  Wound Volume (cm^3) 3.75 cm^3 0.001 cm^3  Wound Healing % -- 100  Non-staged Wound Description Full thickness --  Number of Tunnels 1 --  Tunneling #1 (cm) 1.7 cm --  Tunneling #1 Clock Position of Wound 12 --     Active Orders  Date Order Priority Status Authorizing Provider  02/14/23 1205 Wound Care Sacrum Routine Active Fonda Charlie Morita, MD    - Wound cleansing::    Other (Specify) (soap and water )    - Additional Orders/Instructions::    Follow nutritous diet    - Follow Up Appointment::    Return to clinic in 4 weeks  12/13/22 1302 Wound Care Sacrum Routine Active Fonda Charlie Morita, MD    - Wound cleansing::    Other (Specify) (soap and water )    - Dressing change frequency::    Change dressing every day    - Secondary wound dressing::    Other (Specify) (Mepilex Border)    - Additional Orders/Instructions::    Follow nutritous diet    - Follow Up Appointment::    Return to clinic in (5 weeks.)  10/18/22 1100 Wound Care Sacrum Routine Active Gwendlyn  Kimball Norfolk, MD    - Wound cleansing::    Other (Specify) (soap and water )    - Dressing change frequency::    Change dressing every day    - Primary wound dressing::    Other (Specify) (Prisma saline moistened packing strip)    - Secondary wound dressing::    Other (Specify) (sacrum border)    - Additional Orders/Instructions::    Follow nutritous diet    - Follow Up Appointment::    Return to clinic in 4 weeks     Inactive Orders  Date Order Priority Status Authorizing Provider  08/16/22 1040 Debridement Sacrum Routine Completed Fonda Charlie Morita, MD  06/21/22 1016 Debridement Sacrum Routine Completed Fonda Charlie Morita, MD     Assessment Encounter Diagnoses  Name Primary?  . 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 Ulceration is pinpoint. Hopefully will heal in next few weeks.   PLAN: Hold on border Continue barrier cream w/ aloe. After healed, can try vaseline or aquaphor.  Discussed alternating.  - Change daily or PRN soiling   F/u 4-6 weeks, sooner if needed.     Orders Placed This Encounter  Procedures  . Wound Care Sacrum    SACRUM WOUND: In the office apply Barrier cream.    Wound cleansing::   Other (Specify)             soap and water     Additional Orders/Instructions::   Follow nutritous diet    Follow Up Appointment::   Return to clinic in 4 weeks    Fonda Charlie Morita 02/14/23   History reviewed. No pertinent past medical history.

## 2023-02-14 NOTE — Progress Notes (Signed)
 Dressing applied per providers orders.  Patient tolerated procedure well.  Patient given AVS.

## 2023-02-17 ENCOUNTER — Other Ambulatory Visit (HOSPITAL_COMMUNITY): Payer: Self-pay

## 2023-02-18 ENCOUNTER — Other Ambulatory Visit (HOSPITAL_COMMUNITY): Payer: Self-pay

## 2023-02-18 MED ORDER — CARVEDILOL 12.5 MG PO TABS
12.5000 mg | ORAL_TABLET | Freq: Two times a day (BID) | ORAL | 0 refills | Status: DC
  Filled 2023-02-18: qty 60, 30d supply, fill #0

## 2023-02-24 ENCOUNTER — Other Ambulatory Visit (HOSPITAL_COMMUNITY): Payer: Self-pay

## 2023-02-24 MED ORDER — AZITHROMYCIN 250 MG PO TABS
ORAL_TABLET | ORAL | 0 refills | Status: AC
  Filled 2023-02-24: qty 6, 5d supply, fill #0

## 2023-03-03 ENCOUNTER — Other Ambulatory Visit (HOSPITAL_COMMUNITY): Payer: Self-pay

## 2023-03-03 MED ORDER — AMLODIPINE BESYLATE 10 MG PO TABS
10.0000 mg | ORAL_TABLET | Freq: Every day | ORAL | 1 refills | Status: AC
  Filled 2023-03-25: qty 90, 90d supply, fill #0
  Filled ????-??-??: fill #0

## 2023-03-03 MED ORDER — LOSARTAN POTASSIUM 50 MG PO TABS
50.0000 mg | ORAL_TABLET | Freq: Every day | ORAL | 1 refills | Status: AC
  Filled 2023-03-07 – 2023-03-25 (×2): qty 90, 90d supply, fill #0
  Filled ????-??-??: fill #0

## 2023-03-03 MED ORDER — ISOSORBIDE DINITRATE 20 MG PO TABS
20.0000 mg | ORAL_TABLET | Freq: Three times a day (TID) | ORAL | 1 refills | Status: AC
  Filled 2023-03-17: qty 270, 90d supply, fill #0

## 2023-03-03 MED ORDER — CARVEDILOL 12.5 MG PO TABS
12.5000 mg | ORAL_TABLET | Freq: Two times a day (BID) | ORAL | 0 refills | Status: DC
  Filled 2023-03-07 – 2023-03-24 (×2): qty 60, 30d supply, fill #0

## 2023-03-03 MED ORDER — METHOCARBAMOL 500 MG PO TABS
500.0000 mg | ORAL_TABLET | Freq: Three times a day (TID) | ORAL | 1 refills | Status: AC
  Filled 2023-03-17: qty 270, 90d supply, fill #0

## 2023-03-07 ENCOUNTER — Other Ambulatory Visit (HOSPITAL_COMMUNITY): Payer: Self-pay

## 2023-03-14 ENCOUNTER — Other Ambulatory Visit (HOSPITAL_COMMUNITY): Payer: Self-pay

## 2023-03-16 ENCOUNTER — Other Ambulatory Visit (HOSPITAL_COMMUNITY): Payer: Self-pay

## 2023-03-17 ENCOUNTER — Encounter (HOSPITAL_COMMUNITY): Payer: Self-pay | Admitting: Pharmacist

## 2023-03-17 ENCOUNTER — Other Ambulatory Visit (HOSPITAL_COMMUNITY): Payer: Self-pay

## 2023-03-17 MED ORDER — ONETOUCH VERIO VI STRP
ORAL_STRIP | Freq: Every day | 3 refills | Status: AC
Start: 2023-03-17 — End: ?
  Filled 2023-03-17: qty 100, 90d supply, fill #0

## 2023-03-17 MED ORDER — METOCLOPRAMIDE HCL 5 MG/5ML PO SOLN
10.0000 mg | Freq: Four times a day (QID) | ORAL | 2 refills | Status: DC
  Filled 2023-03-17: qty 360, 9d supply, fill #0
  Filled 2023-04-06: qty 360, 9d supply, fill #1
  Filled 2023-04-29: qty 360, 9d supply, fill #2

## 2023-03-23 ENCOUNTER — Other Ambulatory Visit (HOSPITAL_COMMUNITY): Payer: Self-pay

## 2023-03-24 ENCOUNTER — Other Ambulatory Visit (HOSPITAL_COMMUNITY): Payer: Self-pay

## 2023-03-25 ENCOUNTER — Other Ambulatory Visit (HOSPITAL_COMMUNITY): Payer: Self-pay

## 2023-03-25 ENCOUNTER — Other Ambulatory Visit: Payer: Self-pay

## 2023-03-30 ENCOUNTER — Other Ambulatory Visit (HOSPITAL_COMMUNITY): Payer: Self-pay

## 2023-04-01 ENCOUNTER — Other Ambulatory Visit (HOSPITAL_COMMUNITY): Payer: Self-pay

## 2023-04-04 ENCOUNTER — Other Ambulatory Visit (HOSPITAL_COMMUNITY): Payer: Self-pay

## 2023-04-05 ENCOUNTER — Other Ambulatory Visit (HOSPITAL_COMMUNITY): Payer: Self-pay

## 2023-04-05 MED ORDER — METFORMIN HCL 500 MG PO TABS
500.0000 mg | ORAL_TABLET | Freq: Two times a day (BID) | ORAL | 1 refills | Status: AC
  Filled 2023-04-05 (×2): qty 180, 90d supply, fill #0

## 2023-04-06 ENCOUNTER — Other Ambulatory Visit (HOSPITAL_BASED_OUTPATIENT_CLINIC_OR_DEPARTMENT_OTHER): Payer: Self-pay

## 2023-04-06 ENCOUNTER — Other Ambulatory Visit (HOSPITAL_COMMUNITY): Payer: Self-pay

## 2023-04-06 ENCOUNTER — Other Ambulatory Visit: Payer: Self-pay

## 2023-04-15 ENCOUNTER — Other Ambulatory Visit (HOSPITAL_COMMUNITY): Payer: Self-pay

## 2023-04-29 ENCOUNTER — Other Ambulatory Visit: Payer: Self-pay

## 2023-04-29 ENCOUNTER — Other Ambulatory Visit (HOSPITAL_COMMUNITY): Payer: Self-pay

## 2023-04-29 MED ORDER — CARVEDILOL 12.5 MG PO TABS
12.5000 mg | ORAL_TABLET | Freq: Two times a day (BID) | ORAL | 0 refills | Status: DC
Start: 2023-04-29 — End: 2023-05-24
  Filled 2023-04-29: qty 60, 30d supply, fill #0

## 2023-05-04 ENCOUNTER — Other Ambulatory Visit (HOSPITAL_COMMUNITY): Payer: Self-pay

## 2023-05-05 ENCOUNTER — Other Ambulatory Visit: Payer: Self-pay

## 2023-05-05 ENCOUNTER — Other Ambulatory Visit (HOSPITAL_COMMUNITY): Payer: Self-pay

## 2023-05-13 ENCOUNTER — Other Ambulatory Visit (HOSPITAL_COMMUNITY): Payer: Self-pay

## 2023-05-16 ENCOUNTER — Other Ambulatory Visit (HOSPITAL_COMMUNITY): Payer: Self-pay

## 2023-05-17 ENCOUNTER — Other Ambulatory Visit (HOSPITAL_COMMUNITY): Payer: Self-pay

## 2023-05-17 MED ORDER — ONETOUCH VERIO VI STRP
1.0000 | ORAL_STRIP | Freq: Every day | 3 refills | Status: DC
Start: 2023-05-17 — End: 2023-08-24
  Filled 2023-05-17: qty 100, 100d supply, fill #0

## 2023-05-19 ENCOUNTER — Other Ambulatory Visit (HOSPITAL_COMMUNITY): Payer: Self-pay

## 2023-05-20 ENCOUNTER — Other Ambulatory Visit (HOSPITAL_COMMUNITY): Payer: Self-pay

## 2023-05-23 ENCOUNTER — Other Ambulatory Visit (HOSPITAL_COMMUNITY): Payer: Self-pay

## 2023-05-24 ENCOUNTER — Other Ambulatory Visit (HOSPITAL_COMMUNITY): Payer: Self-pay

## 2023-05-24 MED ORDER — METOCLOPRAMIDE HCL 5 MG/5ML PO SOLN
10.0000 mg | Freq: Four times a day (QID) | ORAL | 2 refills | Status: AC
  Filled 2023-05-24: qty 360, 9d supply, fill #0

## 2023-05-24 MED ORDER — ONETOUCH DELICA PLUS LANCET33G MISC
1.0000 | Freq: Every day | 3 refills | Status: AC
  Filled 2023-05-30: qty 100, 100d supply, fill #0

## 2023-05-24 MED ORDER — CARVEDILOL 12.5 MG PO TABS
12.5000 mg | ORAL_TABLET | Freq: Two times a day (BID) | ORAL | 0 refills | Status: DC
  Filled 2023-05-24: qty 60, 30d supply, fill #0

## 2023-05-26 ENCOUNTER — Other Ambulatory Visit (HOSPITAL_COMMUNITY): Payer: Self-pay

## 2023-05-27 ENCOUNTER — Other Ambulatory Visit (HOSPITAL_COMMUNITY): Payer: Self-pay

## 2023-05-27 ENCOUNTER — Other Ambulatory Visit: Payer: Self-pay

## 2023-05-27 MED ORDER — CARVEDILOL 12.5 MG PO TABS: 12.5000 mg | ORAL_TABLET | Freq: Two times a day (BID) | ORAL | 0 refills | Status: DC

## 2023-05-27 MED ORDER — CARVEDILOL 12.5 MG PO TABS
12.5000 mg | ORAL_TABLET | Freq: Two times a day (BID) | ORAL | 0 refills | Status: AC
  Filled 2023-05-27: qty 60, 30d supply, fill #0

## 2023-05-27 MED ORDER — METOCLOPRAMIDE HCL 5 MG/5ML PO SOLN
10.0000 mg | Freq: Four times a day (QID) | ORAL | 2 refills | Status: DC
  Filled 2023-05-27: qty 360, 9d supply, fill #0

## 2023-05-30 ENCOUNTER — Other Ambulatory Visit (HOSPITAL_COMMUNITY): Payer: Self-pay

## 2023-05-30 ENCOUNTER — Encounter (HOSPITAL_COMMUNITY): Payer: Self-pay

## 2023-06-02 ENCOUNTER — Other Ambulatory Visit (HOSPITAL_COMMUNITY): Payer: Self-pay

## 2023-06-03 ENCOUNTER — Other Ambulatory Visit (HOSPITAL_COMMUNITY): Payer: Self-pay

## 2023-06-03 MED ORDER — ONETOUCH VERIO VI STRP
ORAL_STRIP | 3 refills | Status: DC
Start: 2023-06-03 — End: 2023-08-24

## 2023-08-12 ENCOUNTER — Other Ambulatory Visit (HOSPITAL_COMMUNITY): Payer: Self-pay

## 2023-08-12 MED ORDER — WILZIN 25 MG PO CAPS
25.0000 mg | ORAL_CAPSULE | Freq: Two times a day (BID) | ORAL | 0 refills | Status: AC
  Filled 2023-08-12: qty 42, 21d supply, fill #0

## 2023-08-12 MED ORDER — VITAMIN C 500 MG PO TABS
500.0000 mg | ORAL_TABLET | Freq: Two times a day (BID) | ORAL | 0 refills | Status: AC
  Filled 2023-08-12: qty 42, 21d supply, fill #0

## 2023-08-23 ENCOUNTER — Other Ambulatory Visit: Payer: Self-pay

## 2023-08-23 ENCOUNTER — Encounter (HOSPITAL_COMMUNITY): Payer: Self-pay

## 2023-08-23 ENCOUNTER — Observation Stay (HOSPITAL_COMMUNITY)
Admission: EM | Admit: 2023-08-23 | Discharge: 2023-08-24 | Disposition: A | Discharge status: Home / Self Care | Attending: Emergency Medicine | Admitting: Emergency Medicine

## 2023-08-23 DIAGNOSIS — T85528A Displacement of other gastrointestinal prosthetic devices, implants and grafts, initial encounter: Secondary | ICD-10-CM | POA: Diagnosis not present

## 2023-08-23 DIAGNOSIS — K9423 Gastrostomy malfunction: Principal | ICD-10-CM | POA: Diagnosis present

## 2023-08-23 DIAGNOSIS — D509 Iron deficiency anemia, unspecified: Secondary | ICD-10-CM | POA: Diagnosis not present

## 2023-08-23 DIAGNOSIS — L89154 Pressure ulcer of sacral region, stage 4: Secondary | ICD-10-CM | POA: Diagnosis not present

## 2023-08-23 DIAGNOSIS — Z8673 Personal history of transient ischemic attack (TIA), and cerebral infarction without residual deficits: Secondary | ICD-10-CM | POA: Diagnosis not present

## 2023-08-23 DIAGNOSIS — Z930 Tracheostomy status: Secondary | ICD-10-CM | POA: Diagnosis not present

## 2023-08-23 DIAGNOSIS — E1122 Type 2 diabetes mellitus with diabetic chronic kidney disease: Secondary | ICD-10-CM | POA: Diagnosis not present

## 2023-08-23 DIAGNOSIS — I10 Essential (primary) hypertension: Secondary | ICD-10-CM | POA: Diagnosis present

## 2023-08-23 DIAGNOSIS — N182 Chronic kidney disease, stage 2 (mild): Secondary | ICD-10-CM

## 2023-08-23 DIAGNOSIS — I130 Hypertensive heart and chronic kidney disease with heart failure and stage 1 through stage 4 chronic kidney disease, or unspecified chronic kidney disease: Secondary | ICD-10-CM | POA: Diagnosis not present

## 2023-08-23 DIAGNOSIS — D508 Other iron deficiency anemias: Secondary | ICD-10-CM | POA: Diagnosis not present

## 2023-08-23 DIAGNOSIS — K5900 Constipation, unspecified: Secondary | ICD-10-CM | POA: Diagnosis not present

## 2023-08-23 DIAGNOSIS — N189 Chronic kidney disease, unspecified: Secondary | ICD-10-CM | POA: Diagnosis present

## 2023-08-23 DIAGNOSIS — I6930 Unspecified sequelae of cerebral infarction: Secondary | ICD-10-CM | POA: Diagnosis not present

## 2023-08-23 DIAGNOSIS — E1169 Type 2 diabetes mellitus with other specified complication: Secondary | ICD-10-CM | POA: Diagnosis present

## 2023-08-23 DIAGNOSIS — E669 Obesity, unspecified: Secondary | ICD-10-CM | POA: Diagnosis not present

## 2023-08-23 DIAGNOSIS — R403 Persistent vegetative state: Secondary | ICD-10-CM | POA: Diagnosis present

## 2023-08-23 DIAGNOSIS — Z7984 Long term (current) use of oral hypoglycemic drugs: Secondary | ICD-10-CM | POA: Insufficient documentation

## 2023-08-23 DIAGNOSIS — D649 Anemia, unspecified: Secondary | ICD-10-CM | POA: Diagnosis present

## 2023-08-23 DIAGNOSIS — Z7401 Bed confinement status: Secondary | ICD-10-CM | POA: Insufficient documentation

## 2023-08-23 DIAGNOSIS — J9610 Chronic respiratory failure, unspecified whether with hypoxia or hypercapnia: Secondary | ICD-10-CM | POA: Diagnosis not present

## 2023-08-23 DIAGNOSIS — Z79899 Other long term (current) drug therapy: Secondary | ICD-10-CM | POA: Diagnosis not present

## 2023-08-23 DIAGNOSIS — N1832 Chronic kidney disease, stage 3b: Secondary | ICD-10-CM | POA: Insufficient documentation

## 2023-08-23 DIAGNOSIS — Z9981 Dependence on supplemental oxygen: Secondary | ICD-10-CM | POA: Insufficient documentation

## 2023-08-23 DIAGNOSIS — S31000A Unspecified open wound of lower back and pelvis without penetration into retroperitoneum, initial encounter: Secondary | ICD-10-CM | POA: Diagnosis present

## 2023-08-23 LAB — COMPREHENSIVE METABOLIC PANEL WITH GFR
ALT: 12 U/L (ref 0–44)
AST: 15 U/L (ref 15–41)
Albumin: 2.9 g/dL — ABNORMAL LOW (ref 3.5–5.0)
Alkaline Phosphatase: 63 U/L (ref 38–126)
Anion gap: 12 (ref 5–15)
BUN: 18 mg/dL (ref 6–20)
CO2: 26 mmol/L (ref 22–32)
Calcium: 9.5 mg/dL (ref 8.9–10.3)
Chloride: 104 mmol/L (ref 98–111)
Creatinine, Ser: 0.86 mg/dL (ref 0.61–1.24)
GFR, Estimated: >60 mL/min (ref >60)
Glucose, Bld: 107 mg/dL — ABNORMAL HIGH (ref 70–99)
Potassium: 3.8 mmol/L (ref 3.5–5.1)
Sodium: 142 mmol/L (ref 135–145)
Total Bilirubin: 0.6 mg/dL (ref 0.0–1.2)
Total Protein: 7.1 g/dL (ref 6.5–8.1)

## 2023-08-23 LAB — CBC WITH DIFFERENTIAL/PLATELET
Abs Immature Granulocytes: 0.02 K/uL (ref 0.00–0.07)
Basophils Absolute: 0 K/uL (ref 0.0–0.1)
Basophils Relative: 0 %
Eosinophils Absolute: 0.2 K/uL (ref 0.0–0.5)
Eosinophils Relative: 2 %
HCT: 38.3 % — ABNORMAL LOW (ref 39.0–52.0)
Hemoglobin: 11.6 g/dL — ABNORMAL LOW (ref 13.0–17.0)
Immature Granulocytes: 0 %
Lymphocytes Relative: 20 %
Lymphs Abs: 1.9 K/uL (ref 0.7–4.0)
MCH: 22.6 pg — ABNORMAL LOW (ref 26.0–34.0)
MCHC: 30.3 g/dL (ref 30.0–36.0)
MCV: 74.5 fL — ABNORMAL LOW (ref 80.0–100.0)
Monocytes Absolute: 0.7 K/uL (ref 0.1–1.0)
Monocytes Relative: 8 %
Neutro Abs: 6.6 K/uL (ref 1.7–7.7)
Neutrophils Relative %: 70 %
Platelets: 285 K/uL (ref 150–400)
RBC: 5.14 MIL/uL (ref 4.22–5.81)
RDW: 15.7 % — ABNORMAL HIGH (ref 11.5–15.5)
WBC: 9.4 K/uL (ref 4.0–10.5)
nRBC: 0 % (ref 0.0–0.2)

## 2023-08-23 LAB — HEMOGLOBIN A1C
Hgb A1c MFr Bld: 6.6 % — ABNORMAL HIGH (ref 4.8–5.6)
Mean Plasma Glucose: 142.72 mg/dL

## 2023-08-23 LAB — PHOSPHORUS: Phosphorus: 3.7 mg/dL (ref 2.5–4.6)

## 2023-08-23 LAB — IRON AND TIBC
Iron: 25 ug/dL — ABNORMAL LOW (ref 45–182)
Saturation Ratios: 8 % — ABNORMAL LOW (ref 17.9–39.5)
TIBC: 322 ug/dL (ref 250–450)
UIBC: 297 ug/dL

## 2023-08-23 LAB — GLUCOSE, CAPILLARY: Glucose-Capillary: 111 mg/dL — ABNORMAL HIGH (ref 70–99)

## 2023-08-23 LAB — VITAMIN B12: Vitamin B-12: 501 pg/mL (ref 180–914)

## 2023-08-23 LAB — MAGNESIUM: Magnesium: 2 mg/dL (ref 1.7–2.4)

## 2023-08-23 LAB — RETICULOCYTES
Immature Retic Fract: 20.4 % — ABNORMAL HIGH (ref 2.3–15.9)
RBC.: 5.12 MIL/uL (ref 4.22–5.81)
Retic Count, Absolute: 105 K/uL (ref 19.0–186.0)
Retic Ct Pct: 2.1 % (ref 0.4–3.1)

## 2023-08-23 LAB — FOLATE: Folate: 15.5 ng/mL (ref >5.9)

## 2023-08-23 LAB — FERRITIN: Ferritin: 34 ng/mL (ref 24–336)

## 2023-08-23 MED ORDER — INSULIN ASPART 100 UNIT/ML IJ SOLN
0.0000 [IU] | INTRAMUSCULAR | Status: DC
  Administered 2023-08-24: 1 [IU] via SUBCUTANEOUS

## 2023-08-23 MED ORDER — HYDRALAZINE HCL 20 MG/ML IJ SOLN: 10.0000 mg | INTRAMUSCULAR | Status: DC | PRN

## 2023-08-23 NOTE — Assessment & Plan Note (Signed)
 Difficult to control  Clonidine  patch in place Hydralazine  PRN severe HTN

## 2023-08-23 NOTE — Assessment & Plan Note (Signed)
-   Order Sensitive SSI    -  check TSH and HgA1C  - Hold metformin

## 2023-08-23 NOTE — ED Provider Notes (Signed)
  Physical Exam  There were no vitals taken for this visit.  Physical Exam  Procedures  Procedures  ED Course / MDM    Medical Decision Making Amount and/or Complexity of Data Reviewed Labs: ordered.   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  Unfortunately, we do not have a size 16 [place] G-tube available.  I was able to place a size 14 Foley catheter in the space, attempted to dilate using a size 16 dilator.  Unfortunately, patient's G-tube was not able to be passed.  Left Foley catheter in site.  Patient will need to be admitted for hydration, G-tube replacement by IR in the morning.       Mannie Pac T, DO 08/23/23 [id number]

## 2023-08-23 NOTE — Assessment & Plan Note (Signed)
 Patient is PEG Trach depended bed bound

## 2023-08-23 NOTE — Assessment & Plan Note (Signed)
 -  Wound care consult

## 2023-08-23 NOTE — Subjective & Objective (Signed)
 Patient presents to the emergency department with dislodged PEG tube.  Patient is at baseline PEG and trach dependence secondary to history of stroke in 2022 In emergency department ER provider attempted to replace on G-tube unfortunately there was no size 16 available Foley was placed to protect the track plan for patient to be admitted for G-tube placement by IR in a.m.

## 2023-08-23 NOTE — Assessment & Plan Note (Signed)
 Obtain anemia panel  Transfuse for Hg <7 , rapidly dropping or  if symptomatic

## 2023-08-23 NOTE — H&P (Signed)
 Stephen Delgado FMW:[id number] DOB: April 02, 1965 DOA: 08/23/2023     PCP: [facility], [facility]     Patient arrived to ER on 08/23/23 at 1453 Referred by Attending Mannie Fairy DASEN, DO   Patient coming from:    home Lives   With family with [employer] aid    Chief Complaint:   Chief Complaint  Patient presents with   dislodge g-tube    HPI: Stephen Delgado is a 58 y.o. male with medical history significant of CVA, status post PEG and trach, DM2, CKD, chronic rsp failure on 2L, Sacral decube ulcer, constipation    Presented with  dislodged PEG tube Patient presents to the emergency department with dislodged PEG tube.  Patient is at baseline PEG and trach dependence secondary to history of stroke in 2022 In emergency department ER provider attempted to replace on G-tube unfortunately there was no size 16 available Foley was placed to protect the track plan for patient to be admitted for G-tube placement by IR in a.m.    No cough no fever, no foul smelling urine, has condom cath  No foul smell to the wound For wound they use Prisma ( collagen dressing and cover with a gauze)  Denies significant ETOH intake   Does not smoke     Regarding pertinent Chronic problems:       HTN on NOrvasc  10, Coreg  12.5 bid, Clonidine  0.2 patch, Hydralazine  50 prn SBP >170 , isordil  20 tid, cozaar  50 qd   chronic CHF diastolic/systolic/ combined - last echo  Recent Results (from the past 56199 hours)  ECHOCARDIOGRAM COMPLETE   Collection Time: 12/31/20 10:22 AM  Result Value   BP 148/83   S' Lateral 2.70   Area-P 1/2 3.97   Narrative      ECHOCARDIOGRAM REPORT       1. Left ventricular ejection fraction, by estimation, is 50 to 55%. The left ventricle has low normal function. The left ventricle has no regional wall motion abnormalities. There is moderate concentric left ventricular hypertrophy. Left ventricular  diastolic parameters are consistent with Grade I diastolic dysfunction (impaired  relaxation).  2. Right ventricular systolic function is normal. The right ventricular size is normal.  3. The pericardial effusion is circumferential.  4. The mitral valve is normal in structure. Trivial mitral valve regurgitation. No evidence of mitral stenosis.  5. The aortic valve is normal in structure. Aortic valve regurgitation is mild. No aortic stenosis is present.  6. There is borderline dilatation of the ascending aorta, measuring 37 mm.  7. The inferior vena cava is normal in size with greater than 50% respiratory variability, suggesting right atrial pressure of 3 mmHg.  Comparison(s): A prior study was performed on 04/20/2015. No significant change from prior study.              DM 2 -  Lab Results  Component Value Date   HGBA1C 6.1 (H) 06/02/2021   on   PO meds only       Hx of CVA - *with/out residual deficits on Aspirin  81 mg, 325, Plavix   Chronic anemia - baseline hg Hemoglobin & Hematocrit  Recent Labs    12/29/22 0959  HGB 12.9*   Iron/TIBC/Ferritin/ %Sat    Component Value Date/Time   IRON 32 (L) 05/05/2021 0626   TIBC 155 (L) 05/05/2021 0626   FERRITIN 1,070 (H) 05/05/2021 0626   IRONPCTSAT 21 05/05/2021 0626      While in ER:    Dislodged  PEG tube    Lab Orders         CBC with Differential         Comprehensive metabolic panel         Magnesium          Phosphorus         Hemoglobin A1c         Vitamin B12         Folate         Iron and TIBC         Ferritin         Reticulocytes     Abd- Percutaneous gastrostomy tube in place. No evidence of extravasation or obstruction.   CXR -  NON acute       ED Triage Vitals  Encounter Vitals Group     BP      Girls Systolic BP Percentile      Girls Diastolic BP Percentile      Boys Systolic BP Percentile      Boys Diastolic BP Percentile      Pulse      Resp      Temp      Temp src      SpO2      Weight      Height      Head Circumference      Peak Flow      Pain Score      Pain  Loc      Pain Education      Exclude from Growth Chart   [id number]     _________________________________________ Significant initial  Findings: Abnormal Labs Reviewed  CBC WITH DIFFERENTIAL/PLATELET - Abnormal; Notable for the following components:      Result Value   Hemoglobin 11.6 (*)    HCT 38.3 (*)    MCV 74.5 (*)    MCH 22.6 (*)    RDW 15.7 (*)    All other components within normal limits  COMPREHENSIVE METABOLIC PANEL WITH GFR - Abnormal; Notable for the following components:   Glucose, Bld 107 (*)    Albumin  2.9 (*)    All other components within normal limits  HEMOGLOBIN A1C - Abnormal; Notable for the following components:   Hgb A1c MFr Bld 6.6 (*)    All other components within normal limits  RETICULOCYTES - Abnormal; Notable for the following components:   Immature Retic Fract 20.4 (*)    All other components within normal limits       ECG: Ordered  WBC     Component Value Date/Time   WBC 8.4 12/29/2022 0959   LYMPHSABS 2.0 12/29/2022 0959   LYMPHSABS 1.9 06/02/2021 1513   MONOABS 0.6 12/29/2022 0959   EOSABS 0.2 12/29/2022 0959   EOSABS 0.2 06/02/2021 1513   BASOSABS 0.0 12/29/2022 0959   BASOSABS 0.0 06/02/2021 1513        UA   not ordered   Results for orders placed or performed in visit on 04/29/22  Urine Culture     Status: None   Collection Time: 04/29/22 12:00 AM   Specimen: Urine   Urine  Result Value Ref Range Status   Urine Culture, Routine CANCELED      Comment: Test not performed. Specimen not received at refrigerated temperature.  Result canceled by the ancillary.        __________________________________________________________ Recent Labs  Lab 08/23/23 1935  NA 142  K 3.8  CO2 26  GLUCOSE 107*  BUN  18  CREATININE 0.86  CALCIUM  9.5  MG 2.0  PHOS 3.7    Cr  stable,    Lab Results  Component Value Date   CREATININE 0.86 08/23/2023   CREATININE 0.99 12/29/2022   CREATININE 0.89 02/03/2022    Recent Labs  Lab  08/23/23 1935  AST 15  ALT 12  ALKPHOS 63  BILITOT 0.6  PROT 7.1  ALBUMIN  2.9*   Lab Results  Component Value Date   CALCIUM  9.5 08/23/2023   PHOS 3.7 08/23/2023    Plt: Lab Results  Component Value Date   PLT 285 08/23/2023       Recent Labs  Lab 08/23/23 1935  WBC 9.4  NEUTROABS 6.6  HGB 11.6*  HCT 38.3*  MCV 74.5*  PLT 285    HG/HCT   stable,     Component Value Date/Time   HGB 11.6 (L) 08/23/2023 1935   HGB 10.7 (L) 06/02/2021 1513   HCT 38.3 (L) 08/23/2023 1935   HCT 32.6 (L) 06/02/2021 1513   MCV 74.5 (L) 08/23/2023 1935   MCV 77 (L) 06/02/2021 1513   ____________________________________________ Hospitalist was called for admission for   Gastrostomy tube dysfunction   The following Work up has been ordered so far:  Orders Placed This Encounter  Procedures   Basic metabolic panel   CBC with Differential   Consult for [facility] Admission   Oxygen therapy Mode or (Route): Trach collar; FiO2: 28%      OTHER Significant initial  Findings:  labs showing:     DM  labs:  HbA1C: Recent Labs    08/23/23 1935  HGBA1C 6.6*      CBG (last 3)  No results for input(s): GLUCAP in the last 72 hours.        Cultures:    Component Value Date/Time   SDES BLOOD SITE NOT SPECIFIED 07/09/2021 1409   SPECREQUEST  07/09/2021 1409    BOTTLES DRAWN AEROBIC ONLY Blood Culture results may not be optimal due to an inadequate volume of blood received in culture bottles   CULT  07/09/2021 1409    NO GROWTH 5 DAYS Performed at [facility], [address] [place], [place] [address]    REPTSTATUS 07/14/2021 FINAL 07/09/2021 1409     Radiological Exams on Admission: No results found. _______________________________________________________________________________________________________ Latest  There were no vitals taken for this visit.   Vitals  labs and radiology finding personally reviewed  Review of Systems:    Pertinent positives  include: feeding tube fell out  Constitutional:  No weight loss, night sweats, Fevers, chills, fatigue, weight loss  HEENT:  No headaches, Difficulty swallowing,Tooth/dental problems,Sore throat,  No sneezing, itching, ear ache, nasal congestion, post nasal drip,  Cardio-vascular:  No chest pain, Orthopnea, PND, anasarca, dizziness, palpitations.no Bilateral lower extremity swelling  GI:  No heartburn, indigestion, abdominal pain, nausea, vomiting, diarrhea, change in bowel habits, loss of appetite, melena, blood in stool, hematemesis Resp:  no shortness of breath at rest. No dyspnea on exertion, No excess mucus, no productive cough, No non-productive cough, No coughing up of blood.No change in color of mucus.No wheezing. Skin:  no rash or lesions. No jaundice GU:  no dysuria, change in color of urine, no urgency or frequency. No straining to urinate.  No flank pain.  Musculoskeletal:  No joint pain or no joint swelling. No decreased range of motion. No back pain.  Psych:  No change in mood or affect. No depression or anxiety. No  memory loss.  Neuro: no localizing neurological complaints, no tingling, no weakness, no double vision, no gait abnormality, no slurred speech, no confusion  All systems reviewed and apart from HOPI all are negative _______________________________________________________________________________________________ Past Medical History:   Past Medical History:  Diagnosis Date   Acute bacterial sinusitis    Acute kidney injury on CKD 3b/acute azotemia with mild hypernatremia 01/23/2021   Acute respiratory failure (HCC)    a. 12/2020 in setting of IVH/strokes-->s/p trach.   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 01/2021 MRI: numerous sm acute infarcts in bilat cerebral hemispheres, Lt caudate hemorrhage, IVH, scattered sulcal SAH, mild communicating hydrocephalus.    Cerebral atrophy (HCC) 07/09/2021   Cerebrovascular accident (CVA) (HCC) 03/04/2021   CKD (chronic kidney disease), stage III (HCC)    Diastolic dysfunction    a. 04/2015 Echo: EF 50-55%, mod LVH, no rwma, GrI DD, triv AI, + ASD; b. 12/2020 Echo: EF 50-55%, no rwma, grIDD, nl RV fxn, triv MR, mild AI, Asc Ao 37mm.   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 12/2020 Aucte L basal ganglia intraparenchymal hemorrhage w/ IVH in the setting of hypertensive emergency.   Nontraumatic intracerebral hemorrhage (HCC)    Persistent vegetative state (HCC) 07/09/2021   Pressure injury of skin 04/06/2021   Protein-calorie malnutrition, severe 03/26/2021   Protein-calorie malnutrition/dysphagia due to recent stroke/constipation 03/04/2021   Type II diabetes mellitus [facility])     Past Surgical History:  Procedure Laterality Date   ESOPHAGOGASTRODUODENOSCOPY N/A 01/16/2021   Procedure: ESOPHAGOGASTRODUODENOSCOPY (EGD);  Surgeon: Sebastian Moles, MD;  Location[facility] ENDOSCOPY;  Service: Endoscopy;  Laterality: N/A;   IR REPLC GASTRO/COLONIC TUBE PERCUT W/FLUORO  06/09/2021   NO PAST SURGERIES     PEG PLACEMENT N/A 01/16/2021   Procedure: PERCUTANEOUS ENDOSCOPIC GASTROSTOMY (PEG) PLACEMENT;  Surgeon: Sebastian Moles, MD;  Location: [facility] ENDOSCOPY;  Service: Endoscopy;  Laterality: N/A;    Social History:  Ambulatory   bed bound     reports that he has never smoked. He has never used smokeless tobacco. He reports current alcohol  use. He reports that he does not use drugs.     Family History:   Family History  Problem Relation Age of Onset   Stroke Paternal Uncle    ______________________________________________________________________________________________ Allergies: No Known Allergies   Prior to Admission medications    Medication Sig Start Date End Date Taking? Authorizing Provider  albuterol  (PROVENTIL ) (2.5 MG/3ML) 0.083% nebulizer solution Take 3 mLs (2.5 mg total) by nebulization every 4 (four) hours as needed for wheezing or shortness of breath. 09/03/22     amLODipine  (NORVASC ) 10 MG tablet Place 1 tablet (10 mg total) into feeding tube daily. 12/30/22   Cheryle Page, MD  amLODipine  (NORVASC ) 10 MG tablet Take 1 tablet (10 mg total) by mouth daily. 03/03/23     ascorbic acid  (VITAMIN C ) 500 MG tablet Take 1 tablet (500 mg total) by mouth 2 (two) times daily for 21 days. 08/11/23     carvedilol  (COREG ) 12.5 MG tablet Place 1 tablet (12.5 mg total) into feeding tube 2 (two) times daily. 12/30/22   Cheryle Page, MD  carvedilol  (COREG ) 12.5 MG tablet Take 1 tablet (  12.5 mg total) by mouth 2 (two) times daily with a meal. 05/27/23     carvedilol  (COREG ) 12.5 MG tablet Take 1 tablet (12.5 mg total) by mouth 2 (two) times daily with a meal. 05/27/23     chlorhexidine  (PERIDEX ) 0.12 % solution Swish 15 mLs in mouth for 30 seconds, then spit out. Use 2 (two) times daily. 01/10/23     cloNIDine  (CATAPRES  - DOSED IN MG/24 HR) 0.2 mg/24hr patch Place 1 patch (0.2 mg total) onto the skin once a week. 01/21/23     glucose blood (ONETOUCH VERIO) test strip Test daily and as needed. 03/17/23     glucose blood (ONETOUCH VERIO) test strip Use to test blood sugar daily as needed. 05/17/23     glucose blood (ONETOUCH VERIO) test strip use to test once daily and as needed 06/03/23     glucose blood test strip Use to check blood sugar up to  twice daily 08/16/22   Asenso, Philip, MD  hydrALAZINE  (APRESOLINE ) 25 MG tablet Place 1 tablet (25 mg total) into feeding tube 3 (three) times daily. Patient can take up to 3 times daily 12/30/22   Cheryle Page, MD  isosorbide  dinitrate (ISORDIL ) 20 MG tablet Place 1 tablet (20 mg total) into feeding tube 3 (three) times daily. 12/30/22   Cheryle Page, MD  isosorbide  dinitrate (ISORDIL ) 20 MG  tablet Take 1 tablet (20 mg total) by mouth 3 (three) times daily. 03/03/23     Lancets (ONETOUCH DELICA PLUS LANCET33G) MISC Use 1 lancet daily and as needed, as directed. 05/19/23     Lancets MISC Use as directed once daily 07/28/22   Asenso, Philip, MD  losartan  (COZAAR ) 50 MG tablet Place 1 tablet (50 mg total) into feeding tube daily. 12/30/22   Cheryle Page, MD  losartan  (COZAAR ) 50 MG tablet Take 1 tablet (50 mg total) by mouth daily. 03/03/23     metFORMIN  (GLUCOPHAGE ) 500 MG tablet Place 1 tablet (500 mg total) into feeding tube 2 (two) times daily with a meal. 12/30/22   Cheryle Page, MD  metFORMIN  (GLUCOPHAGE ) 500 MG tablet Take 1 tablet (500 mg total) by mouth 2 (two) times daily with a meal. 04/05/23     methocarbamol  (ROBAXIN ) 500 MG tablet Place 1 tablet (500 mg total) into feeding tube 3 (three) times daily. 12/30/22   Cheryle Page, MD  methocarbamol  (ROBAXIN ) 500 MG tablet Take 1 tablet (500 mg total) by mouth 3 (three) times daily. 03/03/23     metoCLOPramide  (REGLAN ) 5 MG/5ML solution Place 10 mLs (10 mg total) into feeding tube every 6 (six) hours. 12/30/22   Cheryle Page, MD  metoCLOPramide  (REGLAN ) 5 MG/5ML solution Take 10 mLs (10 mg total) by mouth every 6 (six) hours. 05/24/23     metoCLOPramide  (REGLAN ) 5 MG/5ML solution Take 10 mLs (10 mg total) by mouth every 6 (six) hours. 05/27/23     Nutritional Supplements (FEEDING SUPPLEMENT, PROSOURCE TF,) liquid Place 45 mLs into feeding tube 2 (two) times daily. 05/21/21   Elgergawy, Dawood S, MD  senna (SENOKOT) 8.6 MG TABS tablet Place 1 tablet (8.6 mg total) into feeding tube at bedtime as needed. 12/30/22   Cheryle Page, MD  Zinc  Acetate (WILZIN) 25 MG CAPS Take 1 capsule (25 mg total) by mouth 2 (two) times daily for 21 days. 08/11/23       ___________________________________________________________________________________________________ Physical Exam:    01/12/2023   12:00 PM 01/12/2023   10:45 AM 01/12/2023   10:15 AM   Vitals  with BMI  Systolic 127 105 824  Diastolic 92 67 82  Pulse   91     1. General:  in No  Acute distress    Chronically ill   -appearing 2. Psychological: Alert not  Oriented 3. Head/ENT:   Dry Mucous Membranes                          Head Non traumatic, neck supple                         Poor Dentition 4. SKIN: normal  Skin turgor,  Skin clean Dry   Patient has sacral decub in place as per home health unfortunately was not able to turn the patient to examine secondary to precarious session of Foley in the abdomen 5. Heart: Regular rate and rhythm no  Murmur, no Rub or gallop 6. Lungs:   no wheezes some crackles trach in place 7. Abdomen: Soft,  non-tender, Non distended   obese  bowel sounds present Foley placed NG tube oropharynx 8. Lower extremities: no clubbing, cyanosis, no  edema 9. Neurologically all extremities contracted 10. MSK: Normal range of motion  Chart has been reviewed  ______________________________________________________________________________________________  Assessment/Plan  58 y.o. male with medical history significant of CVA, status post PEG and trach, DM2, CKD, chronic rsp failure on 2L, Sacral decube ulcer, constipation   Admitted for   Gastrostomy tube dysfunction     Present on Admission:  PEG tube malfunction (HCC)  Type 2 diabetes mellitus with obesity (HCC)  Primary hypertension  Anemia  Chronic kidney disease (CKD)  Persistent vegetative state (HCC)  Sacral wound   History of CVA with residual deficit Patient is PEG Trach depended bed bound  Type 2 diabetes mellitus with obesity (HCC)  - Order Sensitive SSI    -  check TSH and HgA1C  - Hold metformin    Primary hypertension Difficult to control  Clonidine  patch in place Hydralazine  PRN severe HTN  Dislodged gastrostomy tube IR consult for placement  Anemia Obtain anemia panel  Transfuse for Hg <7 , rapidly dropping or  if symptomatic   Chronic kidney disease (CKD)   -chronic avoid nephrotoxic medications such as NSAIDs, Vanco Zosyn  combo,  avoid hypotension, continue to follow renal function   Persistent vegetative state (HCC) Secondary to CVA  Sacral wound Wound care consult   Other plan as per orders.  DVT prophylaxis:  SCD     Code Status:    Code Status: Prior FULL CODE   as per  family   ACP   none     Family Communication:   Family not at  Bedside    Diet  NPO   Disposition Plan:      To home once workup is complete and patient is stable   Following barriers for discharge:                             PEg tube replaced                      Wound care  consulted                     Consults called:   IR consult   Admission status:  ED Disposition     ED Disposition  Admit   Condition  --  Comment  Hospital Area: [facility] [[id number]]  Level of Care: Telemetry Medical [[id number]]  I expect the patient will be discharged within 24 hours: No (not a candidate for [facility]-2W observation unit)  May place patient in observation at [facility] or [facility] if equivalent level of care is available:: No  Covid Evaluation: Asymptomatic - no recent exposure (last 10 days) testing not required  Diagnosis: PEG tube malfunction [facility]) [[id number]]  Admitting Physician: Mikaelyn Arthurs [[id number]]  Attending Physician: Aleida Crandell [[id number]]  For patients discharging to extended facilities (i.e. SNF, AL, group homes or LTAC) initiate:: Discharge to SNF/Facility Placement COVID-19 Lab Testing Protocol           Obs    Level of care     tele  For 12H    Stephen Delgado 08/23/2023, 8:39 PM    [facility]   after 2 AM please page floor coverage   If 7AM-7PM, please contact the day team taking care of the patient using [email or web address]

## 2023-08-23 NOTE — ED Triage Notes (Signed)
 Pt from home for dislodged g-tube. Non verbal, bed bound, trach.

## 2023-08-23 NOTE — ED Provider Notes (Signed)
 [place] EMERGENCY DEPARTMENT AT [facility] Provider Note   CSN: [id number] Arrival date & time: 08/23/23  1453     Patient presents with: dislodge g-tube   Stephen Delgado is a 59 y.o. male nonverbal, w/ hx of trach and PEG, here with accidental peg tube displacement this morning by family.  Pt had PEG tube replaced last year, 16 french per ED records.  {Add pertinent medical, surgical, social history, OB history to HPI:32947} HPI     Prior to Admission medications   Medication Sig Start Date End Date Taking? Authorizing Provider  albuterol  (PROVENTIL ) (2.5 MG/3ML) 0.083% nebulizer solution Take 3 mLs (2.5 mg total) by nebulization every 4 (four) hours as needed for wheezing or shortness of breath. 09/03/22     amLODipine  (NORVASC ) 10 MG tablet Place 1 tablet (10 mg total) into feeding tube daily. 12/30/22   Cheryle Page, MD  amLODipine  (NORVASC ) 10 MG tablet Take 1 tablet (10 mg total) by mouth daily. 03/03/23     ascorbic acid  (VITAMIN C ) 500 MG tablet Take 1 tablet (500 mg total) by mouth 2 (two) times daily for 21 days. 08/11/23     carvedilol  (COREG ) 12.5 MG tablet Place 1 tablet (12.5 mg total) into feeding tube 2 (two) times daily. 12/30/22   Cheryle Page, MD  carvedilol  (COREG ) 12.5 MG tablet Take 1 tablet (12.5 mg total) by mouth 2 (two) times daily with a meal. 05/27/23     carvedilol  (COREG ) 12.5 MG tablet Take 1 tablet (12.5 mg total) by mouth 2 (two) times daily with a meal. 05/27/23     chlorhexidine  (PERIDEX ) 0.12 % solution Swish 15 mLs in mouth for 30 seconds, then spit out. Use 2 (two) times daily. 01/10/23     cloNIDine  (CATAPRES  - DOSED IN MG/24 HR) 0.2 mg/24hr patch Place 1 patch (0.2 mg total) onto the skin once a week. 01/21/23     glucose blood (ONETOUCH VERIO) test strip Test daily and as needed. 03/17/23     glucose blood (ONETOUCH VERIO) test strip Use to test blood sugar daily as needed. 05/17/23     glucose blood (ONETOUCH VERIO) test strip use to  test once daily and as needed 06/03/23     glucose blood test strip Use to check blood sugar up to  twice daily 08/16/22   Asenso, Philip, MD  hydrALAZINE  (APRESOLINE ) 25 MG tablet Place 1 tablet (25 mg total) into feeding tube 3 (three) times daily. Patient can take up to 3 times daily 12/30/22   Cheryle Page, MD  isosorbide  dinitrate (ISORDIL ) 20 MG tablet Place 1 tablet (20 mg total) into feeding tube 3 (three) times daily. 12/30/22   Cheryle Page, MD  isosorbide  dinitrate (ISORDIL ) 20 MG tablet Take 1 tablet (20 mg total) by mouth 3 (three) times daily. 03/03/23     Lancets (ONETOUCH DELICA PLUS LANCET33G) MISC Use 1 lancet daily and as needed, as directed. 05/19/23     Lancets MISC Use as directed once daily 07/28/22   Asenso, Philip, MD  losartan  (COZAAR ) 50 MG tablet Place 1 tablet (50 mg total) into feeding tube daily. 12/30/22   Cheryle Page, MD  losartan  (COZAAR ) 50 MG tablet Take 1 tablet (50 mg total) by mouth daily. 03/03/23     metFORMIN  (GLUCOPHAGE ) 500 MG tablet Place 1 tablet (500 mg total) into feeding tube 2 (two) times daily with a meal. 12/30/22   Cheryle Page, MD  metFORMIN  (GLUCOPHAGE ) 500 MG tablet Take 1 tablet (500  mg total) by mouth 2 (two) times daily with a meal. 04/05/23     methocarbamol  (ROBAXIN ) 500 MG tablet Place 1 tablet (500 mg total) into feeding tube 3 (three) times daily. 12/30/22   Cheryle Page, MD  methocarbamol  (ROBAXIN ) 500 MG tablet Take 1 tablet (500 mg total) by mouth 3 (three) times daily. 03/03/23     metoCLOPramide  (REGLAN ) 5 MG/5ML solution Place 10 mLs (10 mg total) into feeding tube every 6 (six) hours. 12/30/22   Cheryle Page, MD  metoCLOPramide  (REGLAN ) 5 MG/5ML solution Take 10 mLs (10 mg total) by mouth every 6 (six) hours. 05/24/23     metoCLOPramide  (REGLAN ) 5 MG/5ML solution Take 10 mLs (10 mg total) by mouth every 6 (six) hours. 05/27/23     Nutritional Supplements (FEEDING SUPPLEMENT, PROSOURCE TF,) liquid Place 45 mLs into feeding tube 2  (two) times daily. 05/21/21   Elgergawy, Dawood S, MD  senna (SENOKOT) 8.6 MG TABS tablet Place 1 tablet (8.6 mg total) into feeding tube at bedtime as needed. 12/30/22   Cheryle Page, MD  Zinc  Acetate (WILZIN) 25 MG CAPS Take 1 capsule (25 mg total) by mouth 2 (two) times daily for 21 days. 08/11/23       Allergies: Patient has no known allergies.    Review of Systems  Updated Vital Signs There were no vitals taken for this visit.  Physical Exam Constitutional:      General: He is not in acute distress.    Comments: Nonverbal at baseline  HENT:     Head: Normocephalic and atraumatic.  Eyes:     Conjunctiva/sclera: Conjunctivae normal.     Pupils: Pupils are equal, round, and reactive to light.  Cardiovascular:     Rate and Rhythm: Normal rate and regular rhythm.  Pulmonary:     Effort: Pulmonary effort is normal. No respiratory distress.  Skin:    General: Skin is warm and dry.  Neurological:     General: No focal deficit present.     Mental Status: He is alert. Mental status is at baseline.     (all labs ordered are listed, but only abnormal results are displayed) Labs Reviewed - No data to display  EKG: None  Radiology: No results found.  {Document cardiac monitor, telemetry assessment procedure when appropriate:32947} Procedures   Medications Ordered in the ED - No data to display    {Click here for ABCD2, HEART and other calculators REFRESH Note before signing:1}                              Medical Decision Making  .[email or web address]   {Document critical care time when appropriate  Document review of labs and clinical decision tools ie CHADS2VASC2, etc  Document your independent review of radiology images and any outside records  Document your discussion with family members, caretakers and with consultants  Document social determinants of health affecting pt's care  Document your decision making why or why not admission, treatments were needed:32947:::1}    Final diagnoses:  None    ED Discharge Orders     None

## 2023-08-23 NOTE — Assessment & Plan Note (Signed)
-  chronic avoid nephrotoxic medications such as NSAIDs, Vanco Zosyn combo,  avoid hypotension, continue to follow renal function

## 2023-08-23 NOTE — Progress Notes (Signed)
Responded to consult for IV. On arrival, RN reported IV obtained. Consult cleared. ?

## 2023-08-23 NOTE — Assessment & Plan Note (Signed)
 IR consult for placement

## 2023-08-23 NOTE — Assessment & Plan Note (Signed)
 Secondary to CVA

## 2023-08-24 ENCOUNTER — Observation Stay (HOSPITAL_COMMUNITY)

## 2023-08-24 DIAGNOSIS — K9423 Gastrostomy malfunction: Secondary | ICD-10-CM | POA: Diagnosis not present

## 2023-08-24 HISTORY — PX: IR REPLACE G-TUBE SIMPLE WO FLUORO: IMG2323

## 2023-08-24 LAB — GLUCOSE, CAPILLARY
Glucose-Capillary: 115 mg/dL — ABNORMAL HIGH (ref 70–99)
Glucose-Capillary: 126 mg/dL — ABNORMAL HIGH (ref 70–99)
Glucose-Capillary: 128 mg/dL — ABNORMAL HIGH (ref 70–99)
Glucose-Capillary: 129 mg/dL — ABNORMAL HIGH (ref 70–99)
Glucose-Capillary: 159 mg/dL — ABNORMAL HIGH (ref 70–99)

## 2023-08-24 MED ORDER — GERHARDT'S BUTT CREAM
TOPICAL_CREAM | Freq: Two times a day (BID) | CUTANEOUS | Status: DC
  Filled 2023-08-24: qty 60

## 2023-08-24 MED ORDER — FERROUS SULFATE 300 (60 FE) MG/5ML PO SOLN
300.0000 mg | Freq: Every day | ORAL | 3 refills | Status: AC
Start: 2023-08-24 — End: 2024-08-23

## 2023-08-24 MED ORDER — FREE WATER
200.0000 mL | Status: DC
  Administered 2023-08-24: 200 mL

## 2023-08-24 MED ORDER — GERHARDT'S BUTT CREAM: 1.0000 | TOPICAL_CREAM | Freq: Two times a day (BID) | CUTANEOUS | 0 refills | Status: AC

## 2023-08-24 MED ORDER — OSMOLITE 1.5 CAL PO LIQD
1000.0000 mL | ORAL | Status: DC
  Administered 2023-08-24: 1000 mL

## 2023-08-24 MED ORDER — PROSOURCE TF20 ENFIT COMPATIBL EN LIQD
60.0000 mL | Freq: Every day | ENTERAL | Status: DC
  Administered 2023-08-24: 60 mL
  Filled 2023-08-24: qty 60

## 2023-08-24 MED ORDER — DIATRIZOATE MEGLUMINE & SODIUM 66-10 % PO SOLN
30.0000 mL | Freq: Once | ORAL | Status: AC
  Administered 2023-08-24: 30 mL

## 2023-08-24 NOTE — Discharge Instructions (Signed)
 Resume home tube feeding orders nothing by mouth

## 2023-08-24 NOTE — Progress Notes (Signed)
 Initial Nutrition Assessment  DOCUMENTATION CODES:   Not applicable  INTERVENTION:   Initiate tube feeding via PEG once IR confirms placement: Osmolite 1.5  at 65 ml/h (1560 ml per day) Prosource TF20 60 ml daily FWF 200 ml every 4 hrs Provides 2420 kcal, 118 gm protein, 1194 ml free water  daily  NUTRITION DIAGNOSIS:   Inadequate oral intake related to dysphagia as evidenced by NPO status.  GOAL:   Patient will meet greater than or equal to 90% of their needs   MONITOR:   Supplement acceptance, Labs, I & O's, Weight trends  REASON FOR ASSESSMENT:   Consult Enteral/tube feeding initiation and management  ASSESSMENT:  58 y.o. male who presented after PEG tube became dislodged. 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  Pt sleeping on visit, is unable to communicate at baseline. Son at bedside who was able to provide history.   Patient had a stroke in 2022 subsequently had to have PEG and trach placed, has been bed bound since then, quadriplegic. Pt lives at home with his two sons and wife. Have home health nurse that helps to take care of him. Follows with outpatient wound care office with [facility] for his sacral wound. Per son his wound has been improving, recently started Juven a couple weeks ago as recommended by pt's PCP.   Son is unsure of specific tube feed regimen but knows he is on Osmolite. Pt was last seen at [facility] 12/2022. RD asked about wether tube feed regime from last admissio seemed accurate son agreeable to this tube feed regimen. Son states he has been gaining weight. ON exam pt with significant muscle loss which is to be expected since patient has bee bed bound since 2022. Fat stores remain adequate.   PEG replaced this morning, waiting for IR to confirm placement and use before starting tube feeds. Per team, plan to discharge once pt tolerates his tube feed regimen for a  couple hours with new PEG.   Admit weight: No admin weight   Average Meal Intake: NPO  Nutritionally Relevant Medications: Scheduled Meds:  feeding supplement (PROSource TF20)  60 mL Per Tube Daily   free water   200 mL Per Tube Q4H   Gerhardt's butt cream   Topical BID   insulin  aspart  0-9 Units Subcutaneous Q4H   Continuous Infusions:  feeding supplement (OSMOLITE 1.5 CAL)     PRN Meds:.hydrALAZINE   Labs Reviewed: No labs to review   NUTRITION - FOCUSED PHYSICAL EXAM:  Flowsheet Row Most Recent Value  Orbital Region No depletion  Buccal Region No depletion  Temple Region Mild depletion  Clavicle Bone Region Mild depletion  Clavicle and Acromion Bone Region Mild depletion  Scapular Bone Region Moderate depletion  Dorsal Hand Severe depletion  Patellar Region Severe depletion  Anterior Thigh Region Severe depletion  Posterior Calf Region Severe depletion  Edema (RD Assessment) None  Hair Reviewed  Eyes Unable to assess  [Closed]  Mouth Unable to assess  Skin Reviewed  Nails Reviewed    Diet Order:   Diet Order     None       EDUCATION NEEDS:   Not appropriate for education at this time  Skin:  Skin Assessment: Skin Integrity Issues: Skin Integrity Issues:: Stage II Stage II: Sacrum  Last BM:  PTA  Height:   Ht Readings from Last 1 Encounters:  01/12/23 6' (1.829 m)  Weight:   Wt Readings from Last 1 Encounters:  01/12/23 76.4 kg    Ideal Body Weight:  80.9 kg  BMI:  There is no height or weight on file to calculate BMI.  Estimated Nutritional Needs:   Kcal:  2200-2400 kcal  Protein:  100-120 gm  Fluid:  >2L/day   Olivia Kenning, RD Registered Dietitian  See Amion for more information

## 2023-08-24 NOTE — Discharge Summary (Addendum)
 Physician Discharge Summary   Patient: Stephen Delgado MRN: [id number] DOB: [birth year]  Admit date:     08/23/2023  Discharge date: 08/24/23  Discharge Physician: Owen DELENA Lore   PCP: [facility]   Recommendations at discharge:    Recommend Palliative care follow up at home  Discharge Diagnoses: Principal Problem:   PEG tube malfunction (HCC) Active Problems:   Dislodged gastrostomy tube   History of CVA with residual deficit   Type 2 diabetes mellitus with obesity (HCC)   Anemia   Primary hypertension   Chronic kidney disease (CKD)   Persistent vegetative state (HCC)   Sacral wound  Resolved Problems:   * No resolved hospital problems. [facility] Course: 58 year old with past medical history significant for CVA s/p PEG tube and trach secondary to stroke in  2022, diabetes type 2, CKD, chronic decubitus ulcer, constipation presenting with dislodged peg tube.   IR has been consulted for G-tube placement.  Assessment and Plan:  1-PEG tube malfunction -Underwent G tube placement by IR 7/23. Per IR ok to use Peg tube for medication and feeding. Ok to discharge if tolerates tube feeding. Tube feeding started. Discharge this afternoon if tolerates it.    History of CVA with residual deficits - Status post PEG and trach dependent, bedbound -Continue with support care, and BP medications.    Diabetes type 2 with obesity -Resume Metformin .    Primary hypertension -Resume meds at discharge   Anemia, iron deficiency -Started Ferrous Sulfate .    CKD -Stable.    Persistent vegetative state secondary to CVA -support care    Sacral wound -resume wound care at discharge       Consultants: IR Procedures performed: Peg tube placement.   Disposition: Home Diet recommendation:  Discharge Diet Orders (From admission, onward)     Start     Ordered   08/24/23 0000 NPO  08/24/23 1436           NPO on Peg tube DISCHARGE MEDICATION: Allergies as  of 08/24/2023   No Known Allergies      Medication List     TAKE these medications    acetaminophen  325 MG tablet Commonly known as: TYLENOL  Place 650 mg into feeding tube every 6 (six) hours as needed for moderate pain (pain score 4-6), fever or headache.   albuterol  (2.5 MG/3ML) 0.083% nebulizer solution Commonly known as: PROVENTIL  Take 3 mLs (2.5 mg total) by nebulization every 4 (four) hours as needed for wheezing or shortness of breath.   amLODipine  10 MG tablet Commonly known as: NORVASC  Take 1 tablet (10 mg total) by mouth daily. What changed: how to take this   ascorbic acid  500 MG tablet Commonly known as: VITAMIN C  Take 1 tablet (500 mg total) by mouth 2 (two) times daily for 21 days. What changed: how to take this   carvedilol  12.5 MG tablet Commonly known as: COREG  Take 1 tablet (12.5 mg total) by mouth 2 (two) times daily with a meal. What changed: how to take this   chlorhexidine  0.12 % solution Commonly known as: Peridex  Swish 15 mLs in mouth for 30 seconds, then spit out. Use 2 (two) times daily.   cloNIDine  0.2 mg/24hr patch Commonly known as: CATAPRES  - Dosed in mg/24 hr Place 1 patch (0.2 mg total) onto the skin once a week. What changed: when to take this   feeding supplement (PROSource TF) liquid Place 45 mLs into feeding tube 2 (two) times daily.   ferrous sulfate   300 (60 Fe) MG/5ML syrup Take 5 mLs (300 mg total) by mouth daily.   Gerhardt's butt cream Crea Apply 1 Application topically 2 (two) times daily.   hydrALAZINE  25 MG tablet Commonly known as: APRESOLINE  Place 1 tablet (25 mg total) into feeding tube 3 (three) times daily. Patient can take up to 3 times daily What changed:  when to take this reasons to take this additional instructions   isosorbide  dinitrate 20 MG tablet Commonly known as: ISORDIL  Take 1 tablet (20 mg total) by mouth 3 (three) times daily. What changed: how to take this   losartan  50 MG tablet Commonly  known as: COZAAR  Take 1 tablet (50 mg total) by mouth daily. What changed: how to take this   metFORMIN  500 MG tablet Commonly known as: GLUCOPHAGE  Take 1 tablet (500 mg total) by mouth 2 (two) times daily with a meal. What changed: how to take this   methocarbamol  500 MG tablet Commonly known as: ROBAXIN  Take 1 tablet (500 mg total) by mouth 3 (three) times daily. What changed: how to take this   metoCLOPramide  5 MG/5ML solution Commonly known as: REGLAN  Take 10 mLs (10 mg total) by mouth every 6 (six) hours. What changed: how to take this   OneTouch Delica Plus Lancet33G Misc Use 1 lancet daily and as needed, as directed.   OneTouch Verio test strip Generic drug: glucose blood Test daily and as needed.   polyethylene glycol powder 17 GM/SCOOP powder Commonly known as: GLYCOLAX /MIRALAX  Place 17 g into feeding tube every other day.   senna 8.6 MG Tabs tablet Commonly known as: SENOKOT Place 1 tablet (8.6 mg total) into feeding tube at bedtime as needed. What changed: when to take this   Wilzin 25 MG Caps Generic drug: Zinc  Acetate Take 1 capsule (25 mg total) by mouth 2 (two) times daily for 21 days. What changed: how to take this               Discharge Care Instructions  (From admission, onward)           Start     Ordered   08/24/23 0000  Discharge wound care:       Comments: See wound care documentation   08/24/23 1436            Discharge Exam: There were no vitals filed for this visit. NAD  Condition at discharge: Stable.   The results of significant diagnostics from this hospitalization (including imaging, microbiology, ancillary and laboratory) are listed below for reference.   Imaging Studies: DG ABDOMEN PEG TUBE LOCATION Addendum Date: 08/24/2023 ADDENDUM REPORT: 08/24/2023 10:58 ADDENDUM: For the purposes of discharge consideration, contrast agent is seen filling the gastrostomy appliance and emptying into the stomach. There is no  evidence of abnormal extravasation. These imaging features suggest that the tube is in satisfactory position. This feeding appliance is cleared from a VIR standpoint for administration of medications and feeding. Electronically Signed   By: Maude Naegeli M.D.   On: 08/24/2023 10:58   Result Date: 08/24/2023 CLINICAL DATA:  EXAM: 1 VIEW XRAY OF THE ABDOMEN 08/24/2023 09:23:37 AM COMPARISON: 01/12/2023 CLINICAL HISTORY: Status post gastrostomy tube placement, follow-up exam. FINDINGS: BOWEL: Nonobstructive bowel gas pattern. Contrast injection via the gastrostomy tube demonstrates opacification of the stomach. No contrast extravasation. SOFT TISSUES: No abnormal calcifications or opaque urinary calculi. BONES: Mild convex right thoracolumbar spine curvature. No acute osseous abnormality. IMPRESSION: 1. No contrast extravasation. 2. Nonobstructive bowel gas pattern. Electronically signed  by: Rockey Kilts MD 08/24/2023 09:45 AM EDT RP Workstation: [id number] Electronically Signed   By: Maude Naegeli M.D.   On: 08/24/2023 10:58    Microbiology: Results for orders placed or performed in visit on 04/29/22  Urine Culture     Status: None   Collection Time: 04/29/22 12:00 AM   Specimen: Urine   Urine  Result Value Ref Range Status   Urine Culture, Routine CANCELED      Comment: Test not performed. Specimen not received at refrigerated temperature.  Result canceled by the ancillary.     Labs: CBC: Recent Labs  Lab 08/23/23 1935  WBC 9.4  NEUTROABS 6.6  HGB 11.6*  HCT 38.3*  MCV 74.5*  PLT 285   Basic Metabolic Panel: Recent Labs  Lab 08/23/23 1935  NA 142  K 3.8  CL 104  CO2 26  GLUCOSE 107*  BUN 18  CREATININE 0.86  CALCIUM  9.5  MG 2.0  PHOS 3.7   Liver Function Tests: Recent Labs  Lab 08/23/23 1935  AST 15  ALT 12  ALKPHOS 63  BILITOT 0.6  PROT 7.1  ALBUMIN  2.9*   CBG: Recent Labs  Lab 08/23/23 2147 08/24/23 0044 08/24/23 0439 08/24/23 0849 08/24/23 1140  GLUCAP  111* 115* 126* 129* 128*    Discharge time spent: greater than 30 minutes.  Signed: Owen DELENA Lore, MD [facility] 08/24/2023

## 2023-08-24 NOTE — TOC Initial Note (Addendum)
 Transition of Care (TOC) - Initial/Assessment Note   Spoke to patient and son Fairy at bedside.   Patient nonverbal.   Possible discharge to home this afternoon. Will need PTAR. NCM confirmed address with son.   Patient has hospital bed with air mattress, at home. PAtient also has trach supplies, suction and oxygen at home through [employer]. Son states patient uses humidified oxygen at 1 to 2 L at home   PCP is with [facility], confirmed with Melissa he is a home base primary care patient with [facility], last visit was 08/11/23.    Patient has nurse with [facility]. Called Darleene with [facility], nurse is with [facility] contact is Lamar Moellers, called same and left message . Spoke with Lamar. Robert aware of admission and planned discharge home later today. He will need discharge summary emailed to him at [email or web address]  NCM will continue to follow and arrange PTAR transport home when notified by team.   1440 Was notified via secure chat patient will be ready for discharge at 4 pm. Son at bedside and aware. PTAR called for 4 pm. PTAR paperwork in chart. PTAR aware trach, oxygen PEG etc   Discharge summary emailed to [facility]  Patient Details  Name: Stephen Delgado MRN: [id number] Date of Birth: Aug 07, 1965  Transition of Care [facility]) CM/SW Contact:    Stephane Powell Jansky, RN Phone Number: 08/24/2023, 10:49 AM  Clinical Narrative:                   Expected Discharge Plan: Home w Home Health Services Barriers to Discharge: Continued Medical Work up   Patient Goals and CMS Choice Patient states their goals for this hospitalization and ongoing recovery are:: Son Fairy Stotts plan to return home   Choice offered to / list presented to : Adult Children      Expected Discharge Plan and Services   Discharge Planning Services: CM Consult Post Acute Care Choice: Resumption of Svcs/PTA Provider Living arrangements for the past 2 months: Single Family Home                  DME Arranged: N/A DME Agency: NA       HH Arranged: RN HH AgencyHotel manager Home Health Care Date [facility] Agency Contacted: 08/24/23 Time HH Agency Contacted: 1048 Representative spoke with at [facility] Agency: Lamar left a message  Prior Living Arrangements/Services Living arrangements for the past 2 months: Single Family Home Lives with:: Adult Children, Spouse Patient language and need for interpreter reviewed:: Yes        Need for Family Participation in Patient Care: Yes (Comment) Care giver support system in place?: Yes (comment) Current home services: DME Criminal Activity/Legal Involvement Pertinent to Current Situation/Hospitalization: No - Comment as needed  Activities of Daily Living      Permission Sought/Granted   Permission granted to share information with : Yes, Verbal Permission Granted  Share Information with NAME: son Orlando Puna  Permission granted to share info w AGENCY: [facility]        Emotional Assessment Appearance:: Appears stated age   Affect (typically observed):  (non verbal) Orientation: :  (nonverbal) Alcohol  / Substance Use: Not Applicable Psych Involvement: No (comment)  Admission diagnosis:  Gastrostomy tube dysfunction (HCC) [K94.23] PEG tube malfunction (HCC) [K94.23] Patient Active Problem List   Diagnosis Date Noted   PEG tube malfunction (HCC) 08/23/2023   Sacral wound 08/23/2023   Laceration of lip 12/29/2022   History of CVA  with residual deficit 12/29/2022   S/P percutaneous endoscopic gastrostomy (PEG) tube placement (HCC) 12/29/2022   Aspiration into airway 07/09/2021   Elevated lipase 07/09/2021   Persistent vegetative state (HCC) 07/09/2021   Hypoxia    LFT elevation    Cough    Dislodged gastrostomy tube 06/08/2021   Vomiting in adult    UTI (urinary tract infection) with pyuria    Anemia 04/26/2021   Constipation 03/20/2021   Transaminitis    Muscular hypertonicity    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   Stage 3b chronic kidney disease (CKD) (HCC) 01/23/2021   Type 2 diabetes mellitus with obesity (HCC) 02/28/2016   Encounter for end of life care 02/08/2016   Diabetes mellitus (HCC) 04/28/2015   Primary hypertension 04/19/2015   Chronic kidney disease (CKD) 04/19/2015   Hypokalemia 04/19/2015   PCP:  Franco, [facility] Pharmacy:   [facility] [address], [address] [place] [address] Phone: [phone] Fax: [phone]     Social Drivers of Health (SDOH) Social History: SDOH Screenings   Food Insecurity: No Food Insecurity (08/23/2023)  Housing: High Risk (12/29/2022)  Transportation Needs: Patient Unable To Answer (08/23/2023)  Utilities: Patient Unable To Answer (12/29/2022)  Tobacco Use: Low Risk  (08/23/2023)   SDOH Interventions: Transportation Interventions: Inpatient TOC, PTAR [facility])   Readmission Risk Interventions    02/20/2021    9:31 AM  Readmission Risk Prevention Plan  Transportation Screening Complete  Medication Review Ocean[employer]) Complete  PCP or Specialist appointment within 3-5 days of discharge Not Complete  PCP/Specialist Appt Not Complete comments Patient will need LTC placement at SNF facility.  HRI or Home Care Consult Complete  SW Recovery Care/Counseling Consult Complete  Palliative Care Screening Complete  Skilled Nursing Facility Complete

## 2023-08-24 NOTE — Plan of Care (Signed)
   Problem: Safety: Goal: Ability to remain free from injury will improve Outcome: Progressing

## 2023-08-24 NOTE — Consult Note (Addendum)
 WOC Nurse Consult Note: patient with history of Stage 4 Pressure injury Sacrum starting in 2022; followed at [facility] previously last seen 02/2023 when ulcer almost healed; has since deteriorated and has been followed by home health; per their last note using silver dressing on wound Reason for Consult: sacral wound  Wound type: Healing Stage 4 Pressure Injury sacrum  Pressure Injury POA: Yes Measurement: see nursing flowsheet  Wound bed: 80% red 20% tan, one area of darkened tissue noted superior aspect  Drainage (amount, consistency, odor) no foul odor per MD note  Periwound:  peeling skin  Dressing procedure/placement/frequency:  Cleanse sacrum/buttocks with Vashe wound cleanser Soila [phone]) do not rinse and allow to air dry.  Apply silver hydrofiber ([employer]qu[employer]el [employer] Lawson #[id number]) to open wound bed daily.  Apply Gerhardt's Butt Cream to surrounding intact skin of buttocks.    Patient should be placed on a low air loss mattress for pressure redistribution and moisture management.  Patient should continue with outpatient follow-up for ongoing management.    POC discussed with bedside nurse. WOC team will not follow. Re-consult if further needs arise.   Thank you,    Powell Bar MSN, RN-BC, [employer]

## 2023-08-24 NOTE — Progress Notes (Addendum)
 [id number] spoke with case in IR no objections to discharge if he tolerated tune feeding now that peg is placed per casey In IR [id number] patient being discharged home with PTAR services son at bedside

## 2023-08-24 NOTE — Plan of Care (Signed)
  Problem: Education: Goal: Ability to describe self-care measures that may prevent or decrease complications (Diabetes Survival Skills Education) will improve Outcome: Adequate for Discharge Goal: Individualized Educational Video(s) Outcome: Adequate for Discharge   Problem: Coping: Goal: Ability to adjust to condition or change in health will improve Outcome: Adequate for Discharge   Problem: Coping: Goal: Ability to adjust to condition or change in health will improve Outcome: Adequate for Discharge   Problem: Fluid Volume: Goal: Ability to maintain a balanced intake and output will improve Outcome: Adequate for Discharge   Problem: Health Behavior/Discharge Planning: Goal: Ability to identify and utilize available resources and services will improve Outcome: Adequate for Discharge Goal: Ability to manage health-related needs will improve Outcome: Adequate for Discharge   Problem: Metabolic: Goal: Ability to maintain appropriate glucose levels will improve Outcome: Adequate for Discharge   Problem: Nutritional: Goal: Maintenance of adequate nutrition will improve Outcome: Adequate for Discharge Goal: Progress toward achieving an optimal weight will improve Outcome: Adequate for Discharge   Problem: Skin Integrity: Goal: Risk for impaired skin integrity will decrease Outcome: Adequate for Discharge   Problem: Tissue Perfusion: Goal: Adequacy of tissue perfusion will improve Outcome: Adequate for Discharge   Problem: Education: Goal: Knowledge of General Education information will improve Description: Including pain rating scale, medication(s)/side effects and non-pharmacologic comfort measures Outcome: Adequate for Discharge   Problem: Health Behavior/Discharge Planning: Goal: Ability to manage health-related needs will improve Outcome: Adequate for Discharge   Problem: Clinical Measurements: Goal: Ability to maintain clinical measurements within normal limits  will improve Outcome: Adequate for Discharge Goal: Will remain free from infection Outcome: Adequate for Discharge Goal: Diagnostic test results will improve Outcome: Adequate for Discharge Goal: Respiratory complications will improve Outcome: Adequate for Discharge Goal: Cardiovascular complication will be avoided Outcome: Adequate for Discharge   Problem: Activity: Goal: Risk for activity intolerance will decrease Outcome: Adequate for Discharge   Problem: Nutrition: Goal: Adequate nutrition will be maintained Outcome: Adequate for Discharge   Problem: Coping: Goal: Level of anxiety will decrease Outcome: Adequate for Discharge   Problem: Elimination: Goal: Will not experience complications related to bowel motility Outcome: Adequate for Discharge Goal: Will not experience complications related to urinary retention Outcome: Adequate for Discharge   Problem: Pain Managment: Goal: General experience of comfort will improve and/or be controlled Outcome: Adequate for Discharge   Problem: Safety: Goal: Ability to remain free from injury will improve Outcome: Adequate for Discharge   Problem: Skin Integrity: Goal: Risk for impaired skin integrity will decrease Outcome: Adequate for Discharge

## 2023-09-09 ENCOUNTER — Other Ambulatory Visit (HOSPITAL_COMMUNITY): Payer: Self-pay

## 2023-09-09 MED ORDER — METHOCARBAMOL 500 MG PO TABS
500.0000 mg | ORAL_TABLET | Freq: Three times a day (TID) | ORAL | 0 refills | Status: AC
  Filled 2023-09-09: qty 270, 90d supply, fill #0

## 2023-09-12 ENCOUNTER — Other Ambulatory Visit (HOSPITAL_COMMUNITY): Payer: Self-pay

## 2023-09-12 MED ORDER — METHOCARBAMOL 500 MG PO TABS
500.0000 mg | ORAL_TABLET | Freq: Three times a day (TID) | ORAL | 0 refills | Status: AC
  Filled 2023-09-12 – 2024-01-13 (×2): qty 270, 90d supply, fill #0

## 2023-11-17 NOTE — Telephone Encounter (Signed)
  Orders given to protect with Silicone border until next visit.

## 2023-12-26 ENCOUNTER — Other Ambulatory Visit (HOSPITAL_BASED_OUTPATIENT_CLINIC_OR_DEPARTMENT_OTHER): Payer: Self-pay

## 2023-12-26 ENCOUNTER — Other Ambulatory Visit (HOSPITAL_COMMUNITY): Payer: Self-pay

## 2023-12-26 MED ORDER — GLUCOSE BLOOD VI STRP
ORAL_STRIP | 3 refills | Status: AC
Start: 2023-06-12 — End: ?

## 2023-12-26 MED ORDER — CHLORHEXIDINE GLUCONATE 0.12 % MT SOLN: 15.0000 mL | Freq: Two times a day (BID) | OROMUCOSAL | 8 refills | Status: AC

## 2023-12-26 MED ORDER — LOSARTAN POTASSIUM 50 MG PO TABS
50.0000 mg | ORAL_TABLET | Freq: Every day | ORAL | 1 refills | Status: AC
  Filled 2023-12-28: qty 90, 90d supply, fill #0

## 2023-12-26 MED ORDER — ONDANSETRON HCL 4 MG PO TABS: 4.0000 mg | ORAL_TABLET | Freq: Three times a day (TID) | ORAL | 1 refills | Status: AC | PRN

## 2023-12-26 MED ORDER — HYDRALAZINE HCL 50 MG PO TABS: 50.0000 mg | ORAL_TABLET | Freq: Three times a day (TID) | ORAL | 4 refills | Status: AC | PRN

## 2023-12-26 MED ORDER — CARVEDILOL 12.5 MG PO TABS
12.5000 mg | ORAL_TABLET | Freq: Two times a day (BID) | ORAL | 3 refills | Status: AC
  Filled 2024-01-03: qty 180, 90d supply, fill #0

## 2023-12-26 MED ORDER — METOCLOPRAMIDE HCL 5 MG/5ML PO SOLN: 10.0000 mg | Freq: Four times a day (QID) | ORAL | 1 refills | Status: AC

## 2023-12-26 MED ORDER — ACCU-CHEK SOFTCLIX LANCETS MISC
3 refills | Status: AC
Start: 2023-08-18 — End: ?

## 2023-12-26 MED ORDER — TIZANIDINE HCL 4 MG PO CAPS: 4.0000 mg | ORAL_CAPSULE | Freq: Three times a day (TID) | ORAL | 3 refills | Status: AC

## 2023-12-26 MED ORDER — ISOSORBIDE DINITRATE 20 MG PO TABS
20.0000 mg | ORAL_TABLET | Freq: Three times a day (TID) | ORAL | 3 refills | Status: AC
  Filled 2023-12-28: qty 270, 90d supply, fill #0

## 2023-12-26 MED ORDER — GLUCOSE BLOOD VI STRP: ORAL_STRIP | 3 refills | Status: AC

## 2023-12-26 MED ORDER — METHOCARBAMOL 500 MG PO TABS
500.0000 mg | ORAL_TABLET | Freq: Three times a day (TID) | ORAL | 0 refills | Status: AC
  Filled 2024-02-27 – 2024-02-29 (×2): qty 270, 90d supply, fill #0

## 2023-12-26 MED ORDER — METFORMIN HCL 500 MG PO TABS
500.0000 mg | ORAL_TABLET | Freq: Two times a day (BID) | ORAL | 3 refills | Status: AC
  Filled 2024-01-03: qty 60, 30d supply, fill #0

## 2023-12-26 MED ORDER — ONETOUCH DELICA PLUS LANCET33G MISC: 3 refills | Status: AC

## 2023-12-26 MED ORDER — ACCU-CHEK GUIDE TEST VI STRP: ORAL_STRIP | 3 refills | Status: AC

## 2023-12-27 ENCOUNTER — Other Ambulatory Visit (HOSPITAL_BASED_OUTPATIENT_CLINIC_OR_DEPARTMENT_OTHER): Payer: Self-pay

## 2023-12-28 ENCOUNTER — Other Ambulatory Visit (HOSPITAL_COMMUNITY): Payer: Self-pay

## 2023-12-28 MED ORDER — AMLODIPINE BESYLATE 10 MG PO TABS
10.0000 mg | ORAL_TABLET | Freq: Every day | ORAL | 0 refills | Status: DC
  Filled 2023-12-28: qty 30, 30d supply, fill #0

## 2023-12-31 IMAGING — DX DG ABD PORTABLE 1V
2 series · 2 of 2 positions shown · non-contrast
Comparison: 01/28/2021

CLINICAL DATA: Vomiting.

EXAM:
PORTABLE ABDOMEN - 1 VIEW

[abdomen supine (1 of 2)]
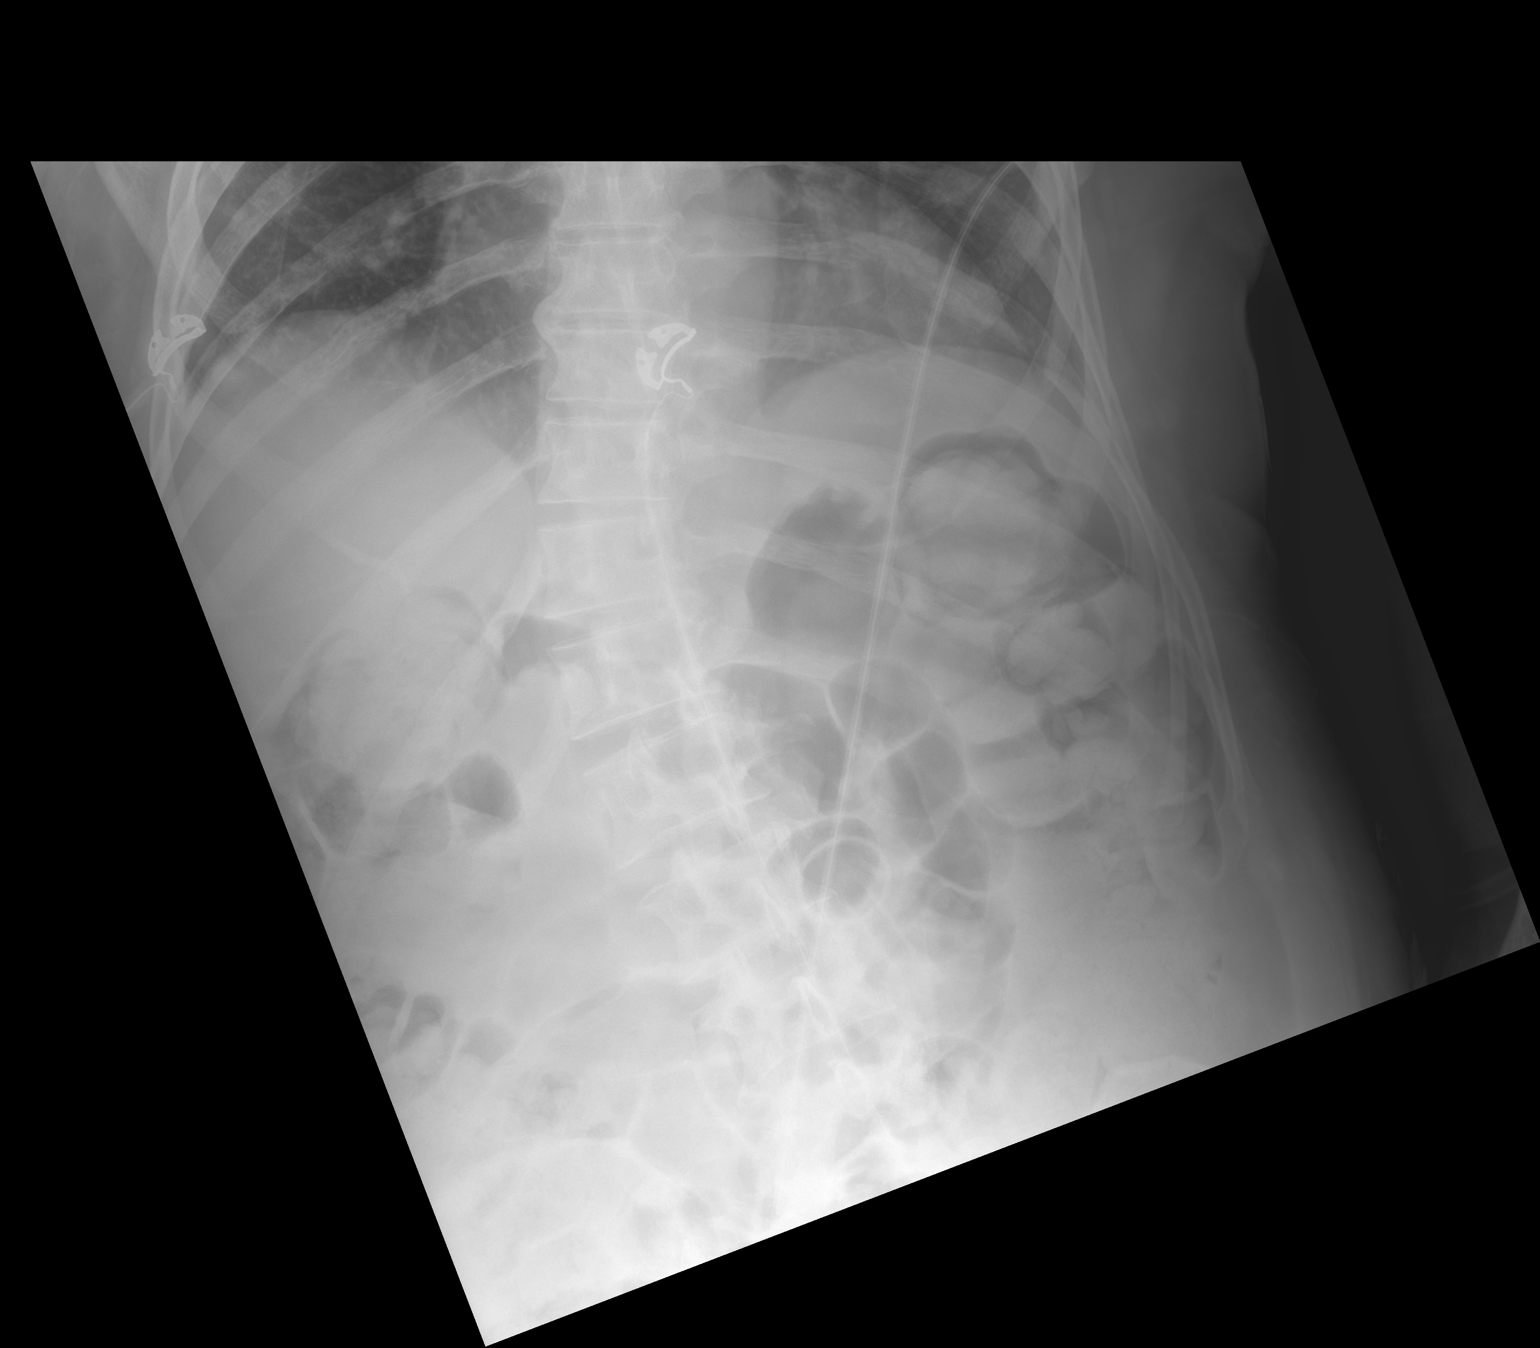

[abdomen supine (2 of 2)]
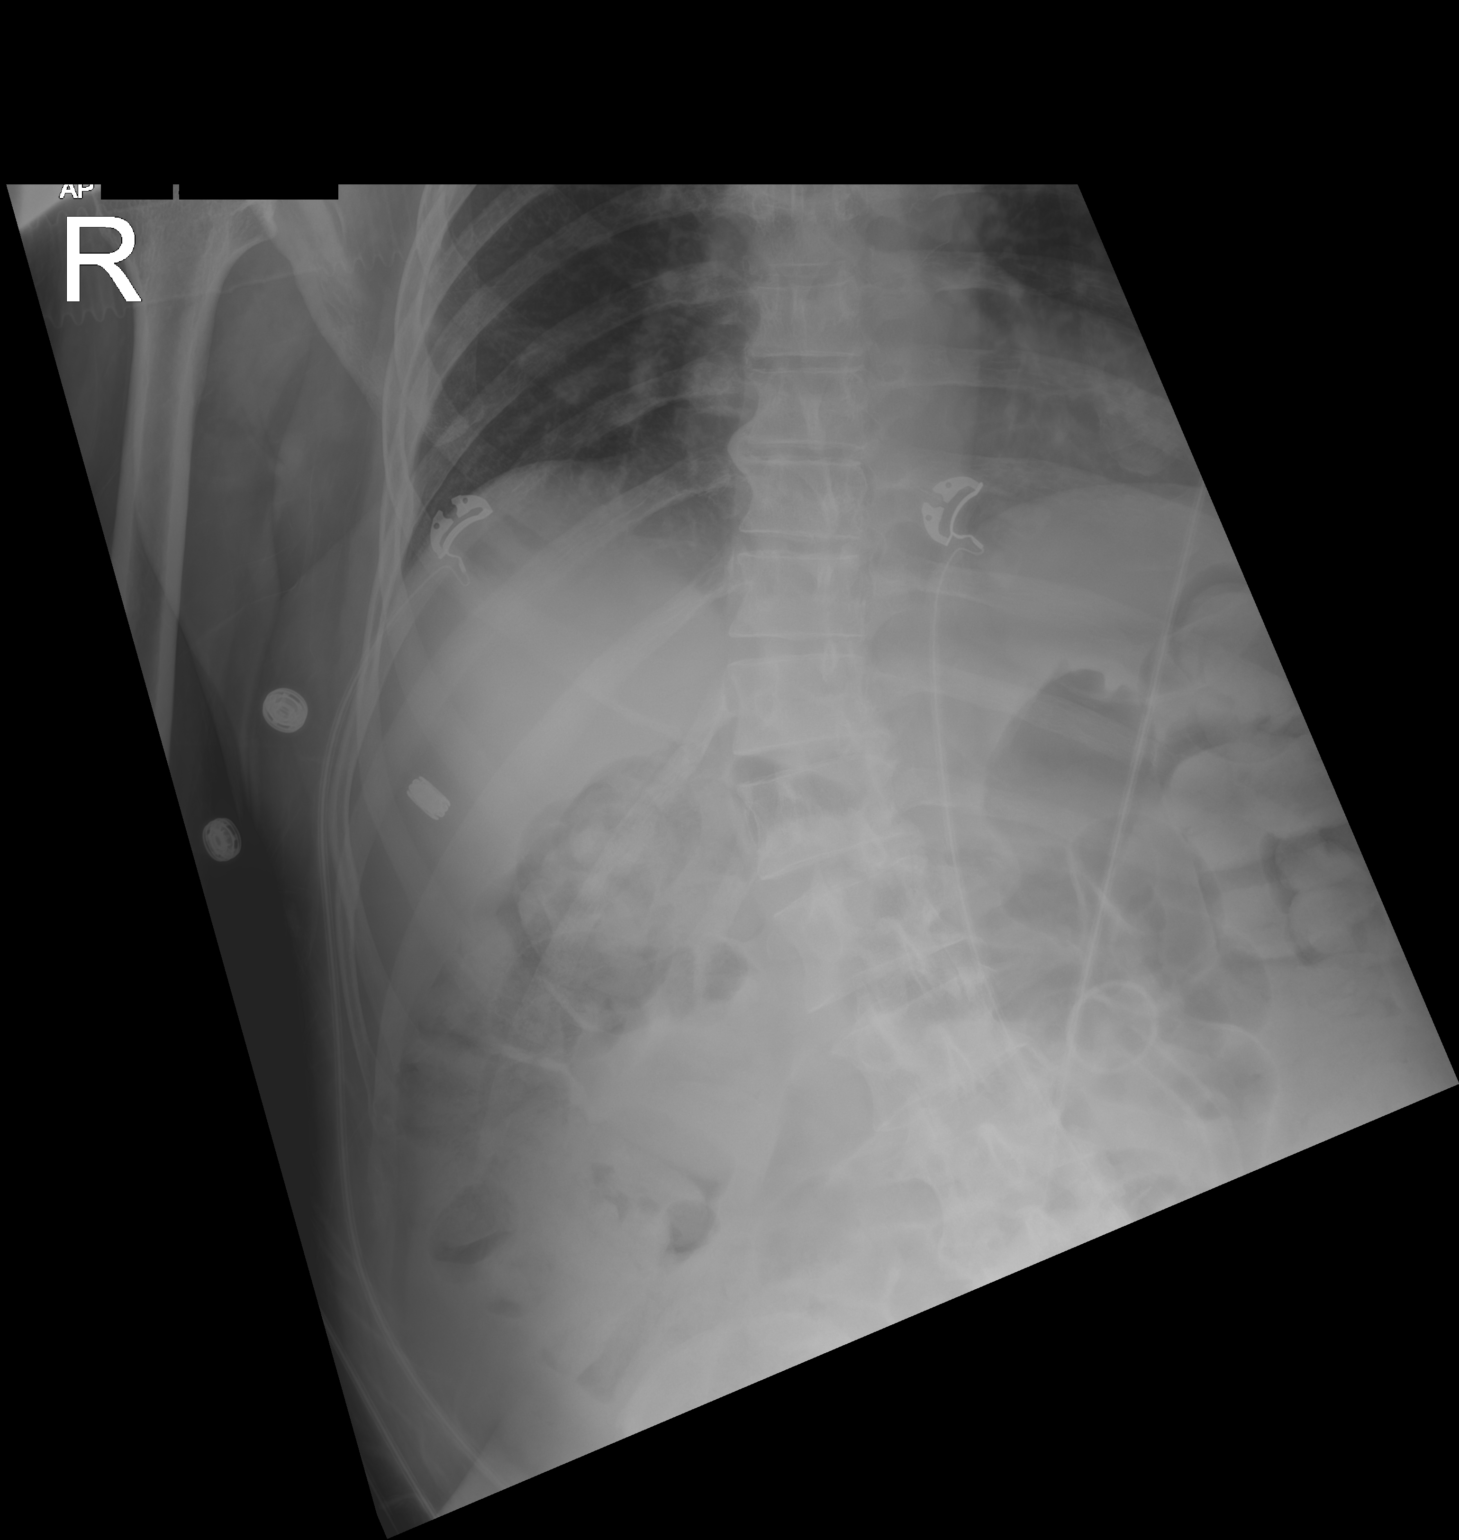

[2 of 2 positions shown; findings below may reference images not displayed]

FINDINGS: Examination is limited by patient condition. The pelvis was not
included. A small to moderate amount of stool is present in the
included portions of the colon. Gas is present in nondilated
abdominal small bowel loops without evidence of obstruction. A
percutaneous gastrostomy tube is noted. Minimal bibasilar lung
opacities likely reflect atelectasis. Thoracolumbar dextroscoliosis
is noted.
IMPRESSION: Nonobstructed bowel gas pattern.

## 2024-01-03 ENCOUNTER — Other Ambulatory Visit (HOSPITAL_COMMUNITY): Payer: Self-pay

## 2024-01-03 IMAGING — DX DG ABD PORTABLE 1V
1 series · 2 of 2 positions shown · non-contrast
Comparison: Plain film of the abdomen dated 03/20/2021

CLINICAL DATA: Ileus

EXAM:
PORTABLE ABDOMEN - 1 VIEW

[Series 1: abdomen · 0.14mm/px · 2 of 2 slices shown]
[im 1/2]
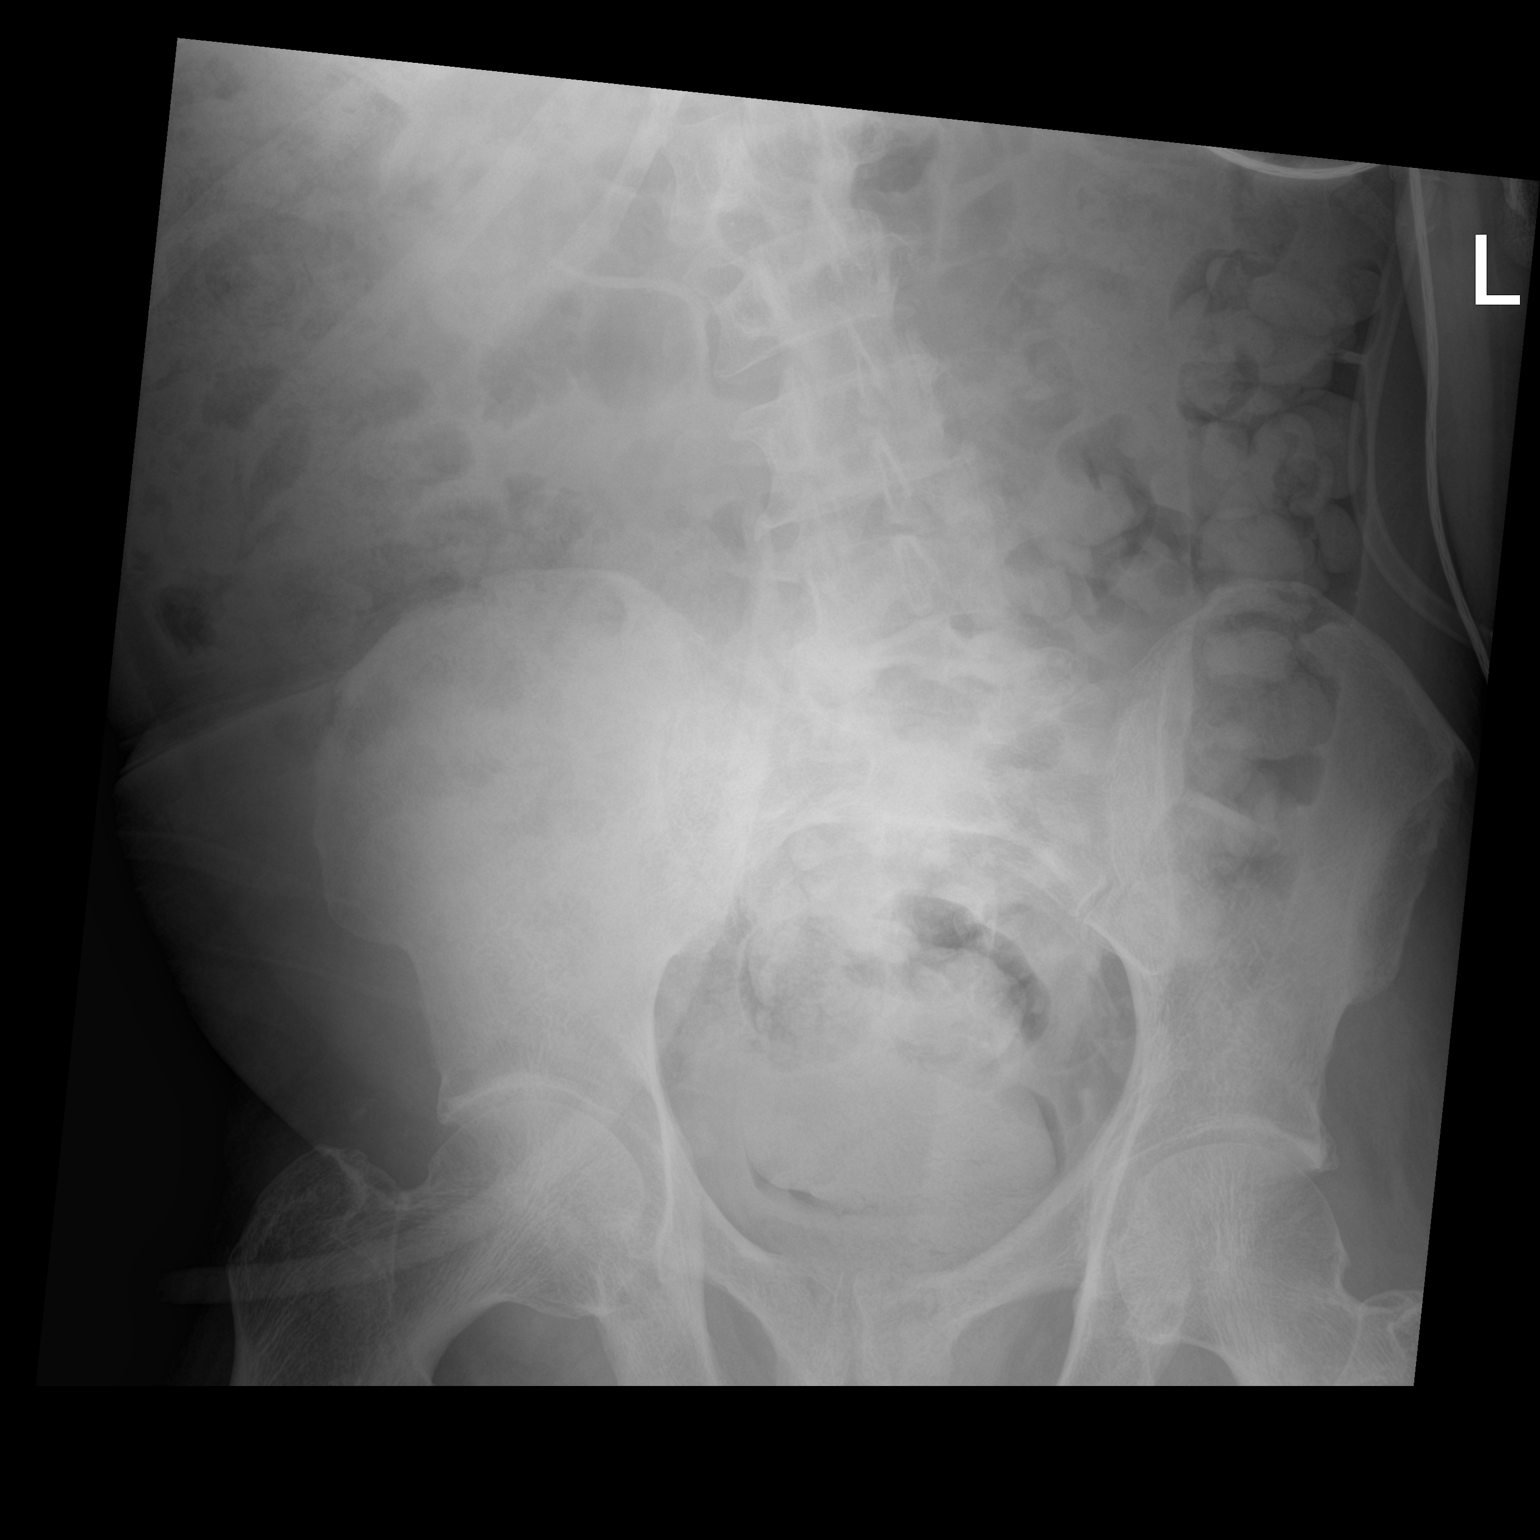
[im 2/2]
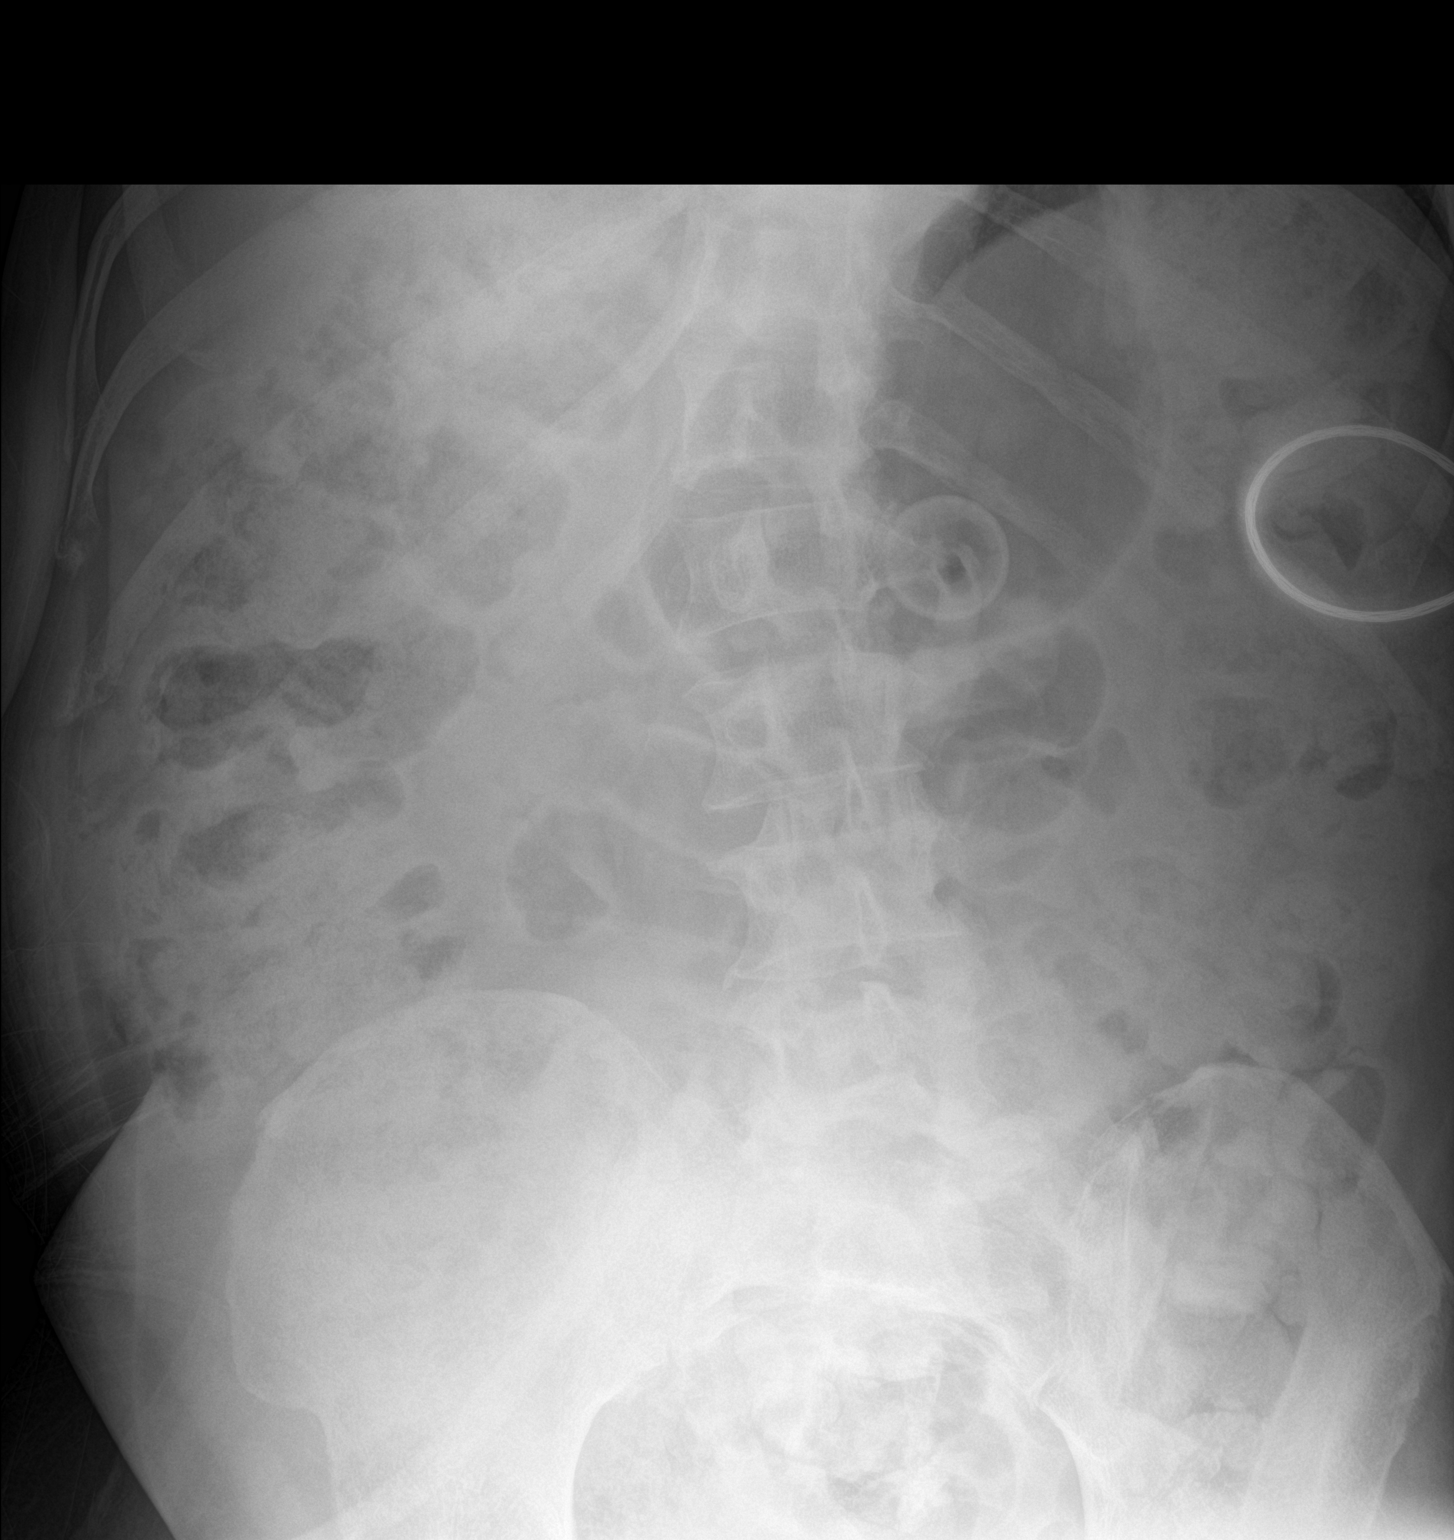

[2 of 2 positions shown; findings below may reference images not displayed]

FINDINGS: No significantly dilated large or small bowel loops are identified.
No air-fluid levels. Large amount of stool is seen throughout the
colon and rectal vault. No evidence of free intraperitoneal air or
abnormal fluid collection is seen. Again noted is prominent
dextroscoliosis of the thoracolumbar spine. No evidence of renal or
ureteral calculi.
IMPRESSION: 1. Large amount of stool throughout the colon and rectal vault,
suggesting constipation.
2. No evidence of bowel obstruction or ileus.

## 2024-01-04 ENCOUNTER — Other Ambulatory Visit (HOSPITAL_COMMUNITY): Payer: Self-pay

## 2024-01-05 IMAGING — DX DG CHEST 1V PORT
1 series · 1 of 1 positions shown · non-contrast
Comparison: Chest radiograph 02/19/2021

CLINICAL DATA: Fever, altered mental status

EXAM:
PORTABLE CHEST 1 VIEW

[chest]
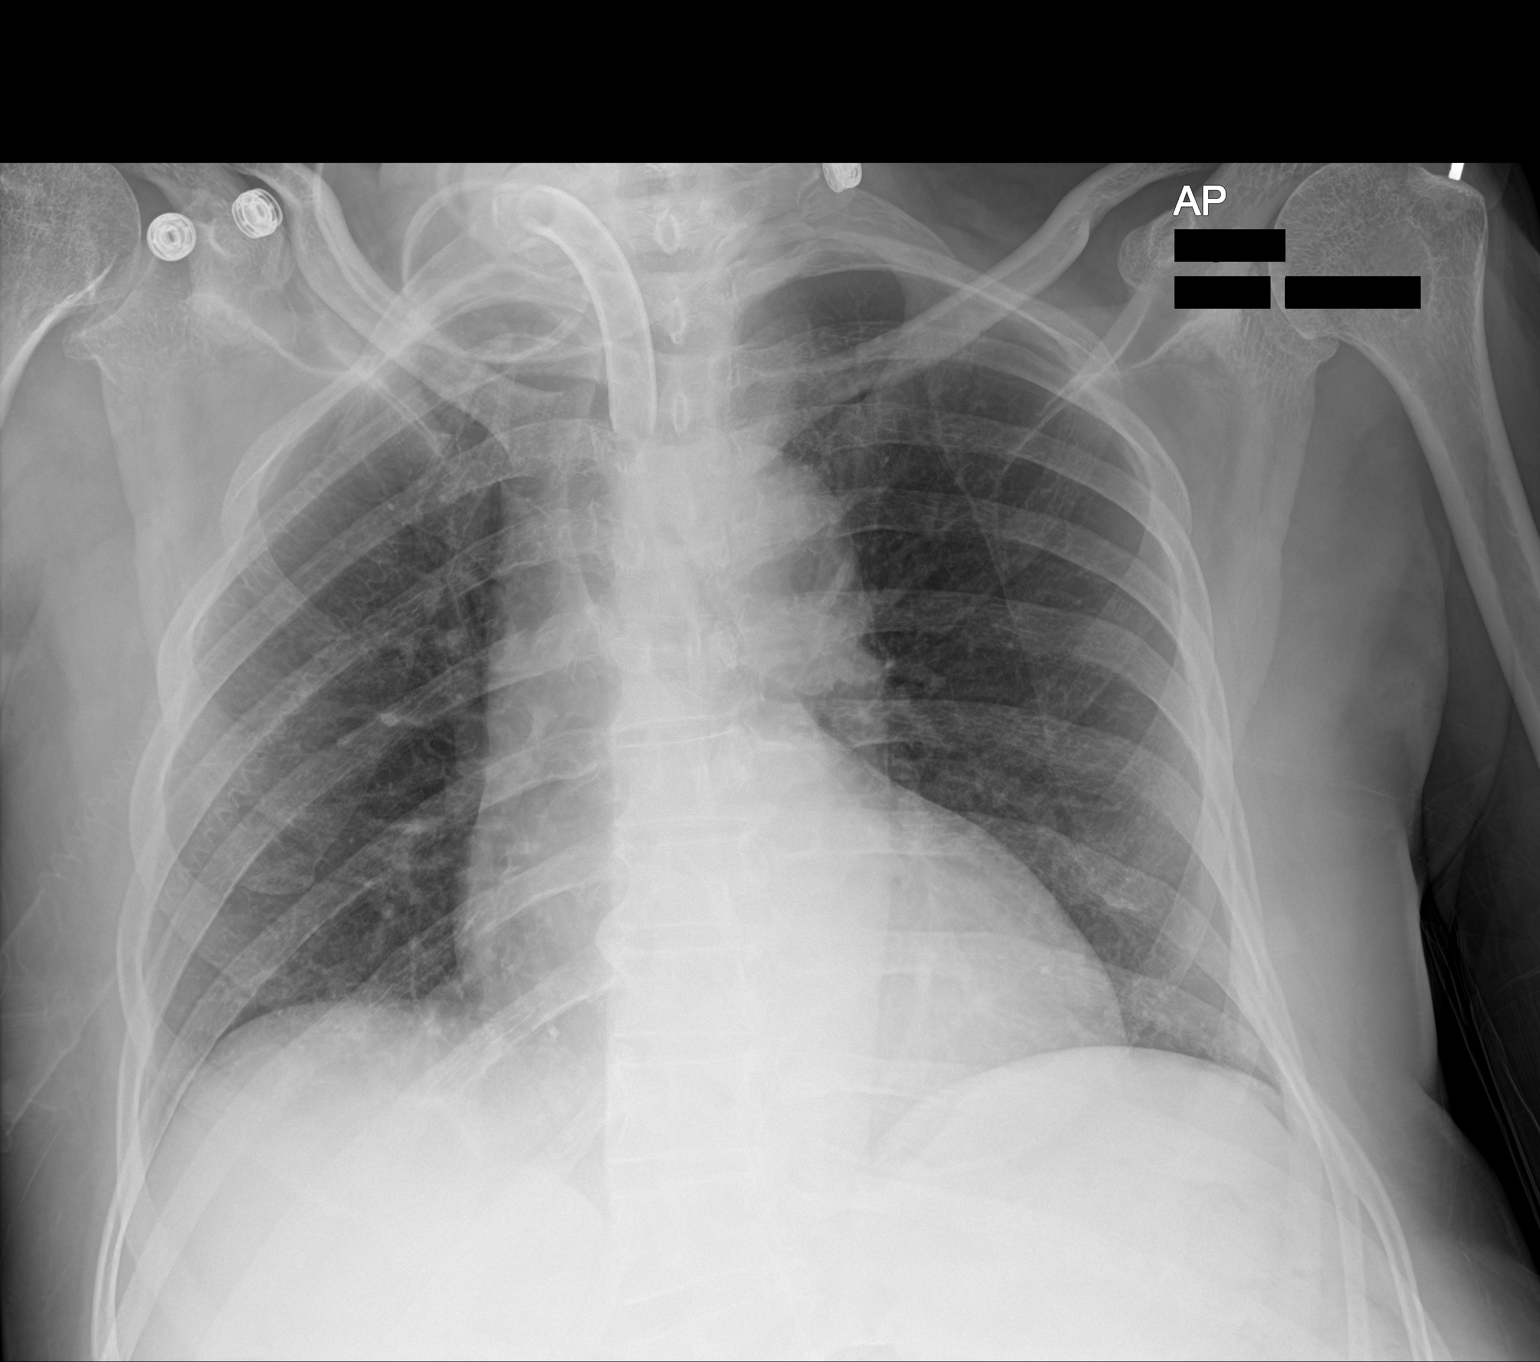

[1 of 1 positions shown; findings below may reference images not displayed]

FINDINGS: The tracheostomy tube is in stable position with the tip terminating
in the midthoracic trachea.

The cardiomediastinal silhouette is normal.

There is no focal consolidation or pulmonary edema. There is no
pleural effusion or pneumothorax.

There is no acute osseous abnormality.
IMPRESSION: No focal consolidation or pleural effusion. No significant interval
change since 02/19/2021.

## 2024-01-06 IMAGING — DX DG ABD PORTABLE 1V
1 series · 1 of 1 positions shown · non-contrast
Comparison: 03/23/2021

CLINICAL DATA: Obstipation

EXAM:
PORTABLE ABDOMEN - 1 VIEW

[abdomen]
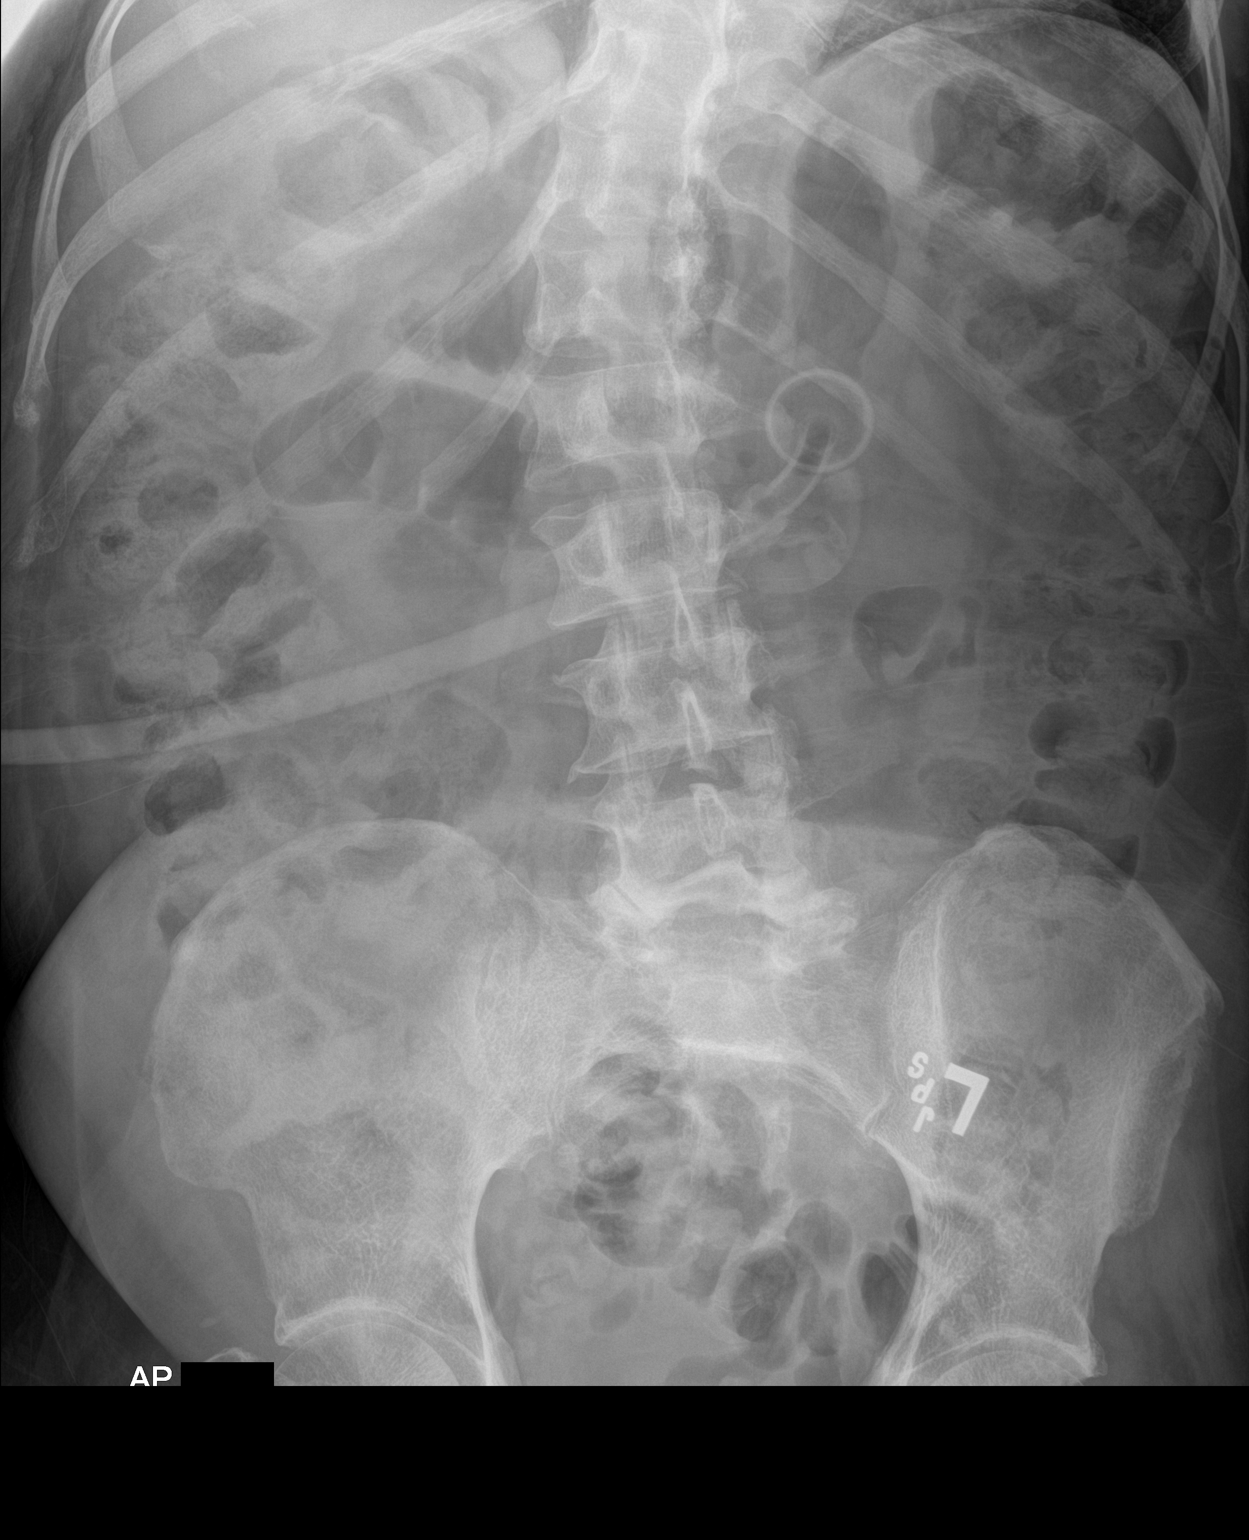

[1 of 1 positions shown; findings below may reference images not displayed]

FINDINGS: Gastrostomy tube projecting over the body of the stomach. Large
amount of stool throughout the colon. No bowel dilatation to suggest
obstruction. No evidence of pneumoperitoneum, portal venous gas or
pneumatosis.

No pathologic calcifications along the expected course of the
ureters.

No acute osseous abnormality.
IMPRESSION: 1. Large amount of stool throughout the colon.

## 2024-01-13 ENCOUNTER — Other Ambulatory Visit: Payer: Self-pay

## 2024-01-13 ENCOUNTER — Other Ambulatory Visit (HOSPITAL_COMMUNITY): Payer: Self-pay

## 2024-01-24 ENCOUNTER — Other Ambulatory Visit (HOSPITAL_COMMUNITY): Payer: Self-pay

## 2024-01-24 MED ORDER — AMLODIPINE BESYLATE 10 MG PO TABS
10.0000 mg | ORAL_TABLET | Freq: Every day | ORAL | 0 refills | Status: DC
  Filled 2024-01-24: qty 30, 30d supply, fill #0

## 2024-01-27 ENCOUNTER — Other Ambulatory Visit (HOSPITAL_COMMUNITY): Payer: Self-pay

## 2024-01-30 ENCOUNTER — Other Ambulatory Visit (HOSPITAL_COMMUNITY): Payer: Self-pay

## 2024-01-30 MED ORDER — CHLORHEXIDINE GLUCONATE 0.12 % MT SOLN
15.0000 mL | Freq: Every day | OROMUCOSAL | 3 refills | Status: AC
  Filled 2024-01-30: qty 473, 17d supply, fill #0
  Filled 2024-03-07: qty 473, 17d supply, fill #1

## 2024-01-31 ENCOUNTER — Other Ambulatory Visit (HOSPITAL_COMMUNITY): Payer: Self-pay

## 2024-02-03 ENCOUNTER — Other Ambulatory Visit (HOSPITAL_COMMUNITY): Payer: Self-pay

## 2024-02-03 MED ORDER — METFORMIN HCL 500 MG PO TABS
500.0000 mg | ORAL_TABLET | Freq: Two times a day (BID) | ORAL | 3 refills | Status: AC
  Filled 2024-02-03: qty 60, 30d supply, fill #0
  Filled 2024-03-07: qty 60, 30d supply, fill #1

## 2024-02-10 ENCOUNTER — Other Ambulatory Visit (HOSPITAL_COMMUNITY): Payer: Self-pay

## 2024-02-22 ENCOUNTER — Other Ambulatory Visit (HOSPITAL_COMMUNITY): Payer: Self-pay

## 2024-02-23 ENCOUNTER — Other Ambulatory Visit (HOSPITAL_COMMUNITY): Payer: Self-pay

## 2024-02-23 MED ORDER — AMLODIPINE BESYLATE 10 MG PO TABS
10.0000 mg | ORAL_TABLET | Freq: Every day | ORAL | 1 refills | Status: AC
  Filled 2024-02-23: qty 90, 90d supply, fill #0

## 2024-02-27 ENCOUNTER — Other Ambulatory Visit (HOSPITAL_COMMUNITY): Payer: Self-pay

## 2024-02-29 ENCOUNTER — Other Ambulatory Visit (HOSPITAL_COMMUNITY): Payer: Self-pay

## 2024-03-02 ENCOUNTER — Other Ambulatory Visit: Payer: Self-pay
